# Patient Record
Sex: Female | Born: 1955
Health system: Southern US, Community
[De-identification: ages and names within clinical notes are randomized; demographics above are authoritative.]

## PROBLEM LIST (undated history)

## (undated) DIAGNOSIS — E785 Hyperlipidemia, unspecified: Secondary | ICD-10-CM

## (undated) DIAGNOSIS — G4733 Obstructive sleep apnea (adult) (pediatric): Secondary | ICD-10-CM

## (undated) DIAGNOSIS — K5792 Diverticulitis of intestine, part unspecified, without perforation or abscess without bleeding: Secondary | ICD-10-CM

## (undated) DIAGNOSIS — K219 Gastro-esophageal reflux disease without esophagitis: Secondary | ICD-10-CM

## (undated) DIAGNOSIS — R7303 Prediabetes: Secondary | ICD-10-CM

## (undated) DIAGNOSIS — F419 Anxiety disorder, unspecified: Secondary | ICD-10-CM

## (undated) DIAGNOSIS — J449 Chronic obstructive pulmonary disease, unspecified: Secondary | ICD-10-CM

## (undated) DIAGNOSIS — F32A Depression, unspecified: Secondary | ICD-10-CM

## (undated) DIAGNOSIS — J45909 Unspecified asthma, uncomplicated: Secondary | ICD-10-CM

## (undated) DIAGNOSIS — J310 Chronic rhinitis: Secondary | ICD-10-CM

## (undated) DIAGNOSIS — T7840XA Allergy, unspecified, initial encounter: Secondary | ICD-10-CM

## (undated) DIAGNOSIS — Z85850 Personal history of malignant neoplasm of thyroid: Secondary | ICD-10-CM

## (undated) DIAGNOSIS — T8859XA Other complications of anesthesia, initial encounter: Secondary | ICD-10-CM

## (undated) DIAGNOSIS — R7302 Impaired glucose tolerance (oral): Principal | ICD-10-CM

## (undated) DIAGNOSIS — M797 Fibromyalgia: Secondary | ICD-10-CM

## (undated) DIAGNOSIS — R0600 Dyspnea, unspecified: Secondary | ICD-10-CM

## (undated) DIAGNOSIS — M1990 Unspecified osteoarthritis, unspecified site: Secondary | ICD-10-CM

## (undated) DIAGNOSIS — I10 Essential (primary) hypertension: Secondary | ICD-10-CM

## (undated) DIAGNOSIS — C801 Malignant (primary) neoplasm, unspecified: Secondary | ICD-10-CM

## (undated) DIAGNOSIS — L409 Psoriasis, unspecified: Secondary | ICD-10-CM

## (undated) DIAGNOSIS — F329 Major depressive disorder, single episode, unspecified: Secondary | ICD-10-CM

## (undated) DIAGNOSIS — C3490 Malignant neoplasm of unspecified part of unspecified bronchus or lung: Secondary | ICD-10-CM

## (undated) DIAGNOSIS — M353 Polymyalgia rheumatica: Secondary | ICD-10-CM

## (undated) DIAGNOSIS — K7460 Unspecified cirrhosis of liver: Secondary | ICD-10-CM

## (undated) DIAGNOSIS — Z720 Tobacco use: Secondary | ICD-10-CM

## (undated) HISTORY — DX: Obstructive sleep apnea (adult) (pediatric): G47.33

## (undated) HISTORY — PX: OTHER SURGICAL HISTORY: SHX169

## (undated) HISTORY — DX: Impaired glucose tolerance (oral): R73.02

## (undated) HISTORY — PX: CHOLECYSTECTOMY: SHX55

## (undated) HISTORY — DX: Chronic rhinitis: J31.0

## (undated) HISTORY — DX: Essential (primary) hypertension: I10

## (undated) HISTORY — DX: Major depressive disorder, single episode, unspecified: F32.9

## (undated) HISTORY — DX: Personal history of malignant neoplasm of thyroid: Z85.850

## (undated) HISTORY — DX: Chronic obstructive pulmonary disease, unspecified: J44.9

## (undated) HISTORY — DX: Diverticulitis of intestine, part unspecified, without perforation or abscess without bleeding: K57.92

## (undated) HISTORY — DX: Hyperlipidemia, unspecified: E78.5

## (undated) HISTORY — DX: Polymyalgia rheumatica: M35.3

## (undated) HISTORY — PX: TOTAL ABDOMINAL HYSTERECTOMY: SHX209

## (undated) HISTORY — DX: Depression, unspecified: F32.A

## (undated) HISTORY — DX: Anxiety disorder, unspecified: F41.9

## (undated) HISTORY — DX: Unspecified cirrhosis of liver: K74.60

## (undated) HISTORY — DX: Tobacco use: Z72.0

## (undated) HISTORY — DX: Unspecified asthma, uncomplicated: J45.909

## (undated) HISTORY — PX: CHOLESTEATOMA EXCISION: SHX1345

## (undated) HISTORY — DX: Malignant neoplasm of unspecified part of unspecified bronchus or lung: C34.90

## (undated) HISTORY — DX: Allergy, unspecified, initial encounter: T78.40XA

## (undated) HISTORY — DX: Unspecified osteoarthritis, unspecified site: M19.90

## (undated) HISTORY — PX: APPENDECTOMY: SHX54

---

## 1999-04-11 ENCOUNTER — Encounter: Admission: RE | Admit: 1999-04-11 | Discharge: 1999-05-01 | Payer: Self-pay | Admitting: Unknown Physician Specialty

## 1999-05-02 ENCOUNTER — Encounter: Admission: RE | Admit: 1999-05-02 | Discharge: 1999-07-31 | Payer: Self-pay | Admitting: Unknown Physician Specialty

## 2000-02-29 ENCOUNTER — Other Ambulatory Visit: Admission: RE | Admit: 2000-02-29 | Discharge: 2000-02-29 | Payer: Self-pay | Admitting: Obstetrics and Gynecology

## 2000-07-31 ENCOUNTER — Encounter: Payer: Self-pay | Admitting: Pulmonary Disease

## 2000-07-31 ENCOUNTER — Ambulatory Visit: Admission: RE | Admit: 2000-07-31 | Discharge: 2000-07-31 | Payer: Self-pay | Admitting: Pulmonary Disease

## 2001-05-25 ENCOUNTER — Encounter: Payer: Self-pay | Admitting: Internal Medicine

## 2001-05-25 ENCOUNTER — Ambulatory Visit (HOSPITAL_BASED_OUTPATIENT_CLINIC_OR_DEPARTMENT_OTHER): Admission: RE | Admit: 2001-05-25 | Discharge: 2001-05-25 | Payer: Self-pay | Admitting: Pulmonary Disease

## 2001-07-21 ENCOUNTER — Other Ambulatory Visit: Admission: RE | Admit: 2001-07-21 | Discharge: 2001-07-21 | Payer: Self-pay | Admitting: *Deleted

## 2002-11-05 ENCOUNTER — Encounter: Payer: Self-pay | Admitting: Orthopedic Surgery

## 2002-11-05 ENCOUNTER — Encounter: Admission: RE | Admit: 2002-11-05 | Discharge: 2002-11-05 | Payer: Self-pay | Admitting: Orthopedic Surgery

## 2004-12-20 ENCOUNTER — Other Ambulatory Visit: Admission: RE | Admit: 2004-12-20 | Discharge: 2004-12-20 | Payer: Self-pay | Admitting: *Deleted

## 2005-06-06 ENCOUNTER — Encounter (HOSPITAL_COMMUNITY): Admission: RE | Admit: 2005-06-06 | Discharge: 2005-09-04 | Payer: Self-pay | Admitting: Dermatology

## 2005-09-11 ENCOUNTER — Encounter (HOSPITAL_COMMUNITY): Admission: RE | Admit: 2005-09-11 | Discharge: 2005-12-10 | Payer: Self-pay | Admitting: Dermatology

## 2005-12-31 ENCOUNTER — Encounter (HOSPITAL_COMMUNITY): Admission: RE | Admit: 2005-12-31 | Discharge: 2006-03-31 | Payer: Self-pay | Admitting: Dermatology

## 2006-04-22 ENCOUNTER — Encounter (HOSPITAL_COMMUNITY): Admission: RE | Admit: 2006-04-22 | Discharge: 2006-07-21 | Payer: Self-pay | Admitting: Dermatology

## 2008-09-25 ENCOUNTER — Emergency Department (HOSPITAL_COMMUNITY): Admission: EM | Admit: 2008-09-25 | Discharge: 2008-09-25 | Payer: Self-pay | Admitting: Emergency Medicine

## 2008-09-25 ENCOUNTER — Encounter (INDEPENDENT_AMBULATORY_CARE_PROVIDER_SITE_OTHER): Payer: Self-pay | Admitting: Emergency Medicine

## 2008-09-25 ENCOUNTER — Ambulatory Visit: Payer: Self-pay | Admitting: Vascular Surgery

## 2009-12-30 ENCOUNTER — Ambulatory Visit: Payer: Self-pay | Admitting: Internal Medicine

## 2009-12-30 DIAGNOSIS — G4733 Obstructive sleep apnea (adult) (pediatric): Secondary | ICD-10-CM | POA: Insufficient documentation

## 2009-12-30 DIAGNOSIS — I10 Essential (primary) hypertension: Secondary | ICD-10-CM

## 2009-12-30 DIAGNOSIS — M810 Age-related osteoporosis without current pathological fracture: Secondary | ICD-10-CM | POA: Insufficient documentation

## 2009-12-30 DIAGNOSIS — N63 Unspecified lump in unspecified breast: Secondary | ICD-10-CM

## 2009-12-30 DIAGNOSIS — J42 Unspecified chronic bronchitis: Secondary | ICD-10-CM

## 2009-12-30 DIAGNOSIS — Z862 Personal history of diseases of the blood and blood-forming organs and certain disorders involving the immune mechanism: Secondary | ICD-10-CM

## 2009-12-30 DIAGNOSIS — Z8639 Personal history of other endocrine, nutritional and metabolic disease: Secondary | ICD-10-CM

## 2009-12-30 DIAGNOSIS — E662 Morbid (severe) obesity with alveolar hypoventilation: Secondary | ICD-10-CM | POA: Insufficient documentation

## 2009-12-30 DIAGNOSIS — G5600 Carpal tunnel syndrome, unspecified upper limb: Secondary | ICD-10-CM | POA: Insufficient documentation

## 2009-12-30 DIAGNOSIS — M797 Fibromyalgia: Secondary | ICD-10-CM | POA: Insufficient documentation

## 2009-12-30 DIAGNOSIS — F419 Anxiety disorder, unspecified: Secondary | ICD-10-CM

## 2010-01-09 LAB — CONVERTED CEMR LAB: A-1 Antitrypsin, Ser: 151 mg/dL (ref 83–200)

## 2010-01-12 ENCOUNTER — Encounter: Payer: Self-pay | Admitting: Internal Medicine

## 2010-01-16 ENCOUNTER — Encounter: Admission: RE | Admit: 2010-01-16 | Discharge: 2010-01-16 | Payer: Self-pay | Admitting: Surgery

## 2010-01-23 ENCOUNTER — Ambulatory Visit: Payer: Self-pay | Admitting: Internal Medicine

## 2010-01-23 DIAGNOSIS — R0609 Other forms of dyspnea: Secondary | ICD-10-CM

## 2010-01-23 DIAGNOSIS — R0989 Other specified symptoms and signs involving the circulatory and respiratory systems: Secondary | ICD-10-CM

## 2010-02-07 ENCOUNTER — Ambulatory Visit: Payer: Self-pay | Admitting: Internal Medicine

## 2010-02-09 DIAGNOSIS — Z720 Tobacco use: Secondary | ICD-10-CM

## 2010-02-13 ENCOUNTER — Encounter: Payer: Self-pay | Admitting: Internal Medicine

## 2010-02-13 ENCOUNTER — Telehealth (INDEPENDENT_AMBULATORY_CARE_PROVIDER_SITE_OTHER): Payer: Self-pay | Admitting: *Deleted

## 2010-03-05 ENCOUNTER — Encounter: Payer: Self-pay | Admitting: Internal Medicine

## 2010-03-07 ENCOUNTER — Encounter: Payer: Self-pay | Admitting: Internal Medicine

## 2010-03-10 ENCOUNTER — Ambulatory Visit: Payer: Self-pay | Admitting: Internal Medicine

## 2010-03-17 ENCOUNTER — Telehealth: Payer: Self-pay | Admitting: Internal Medicine

## 2010-03-27 ENCOUNTER — Encounter: Payer: Self-pay | Admitting: Internal Medicine

## 2010-04-14 ENCOUNTER — Telehealth (INDEPENDENT_AMBULATORY_CARE_PROVIDER_SITE_OTHER): Payer: Self-pay | Admitting: *Deleted

## 2010-04-14 ENCOUNTER — Encounter (INDEPENDENT_AMBULATORY_CARE_PROVIDER_SITE_OTHER): Payer: Self-pay | Admitting: *Deleted

## 2010-07-14 ENCOUNTER — Ambulatory Visit: Payer: Self-pay | Admitting: Internal Medicine

## 2010-07-14 DIAGNOSIS — G4700 Insomnia, unspecified: Secondary | ICD-10-CM

## 2010-07-16 DIAGNOSIS — L409 Psoriasis, unspecified: Secondary | ICD-10-CM | POA: Insufficient documentation

## 2010-08-09 ENCOUNTER — Ambulatory Visit: Payer: Self-pay | Admitting: Internal Medicine

## 2010-08-09 DIAGNOSIS — R1084 Generalized abdominal pain: Secondary | ICD-10-CM | POA: Insufficient documentation

## 2010-08-09 DIAGNOSIS — R1319 Other dysphagia: Secondary | ICD-10-CM

## 2010-08-09 DIAGNOSIS — K219 Gastro-esophageal reflux disease without esophagitis: Secondary | ICD-10-CM

## 2010-09-04 ENCOUNTER — Telehealth: Payer: Self-pay | Admitting: Internal Medicine

## 2010-09-04 ENCOUNTER — Encounter: Payer: Self-pay | Admitting: Internal Medicine

## 2010-10-18 ENCOUNTER — Telehealth: Payer: Self-pay | Admitting: Internal Medicine

## 2010-10-19 ENCOUNTER — Ambulatory Visit (HOSPITAL_COMMUNITY)
Admission: RE | Admit: 2010-10-19 | Discharge: 2010-10-19 | Payer: Self-pay | Source: Home / Self Care | Attending: Internal Medicine | Admitting: Internal Medicine

## 2010-10-19 ENCOUNTER — Encounter: Payer: Self-pay | Admitting: Internal Medicine

## 2010-10-20 ENCOUNTER — Encounter: Payer: Self-pay | Admitting: Internal Medicine

## 2010-10-31 NOTE — Assessment & Plan Note (Signed)
**Note De-Identified [place] Obfuscation** Summary: Dysphasia, abdominal pain, colonoscopy (new pt)   History of Present Illness Primary GI MD: Yancey Flemings MD Primary Provider: Joette Catching, MD Requesting Provider: Joette Catching M.D. Chief Complaint: Solid food dysphagia and acid reflux. Pt states she LLQ intermittant abd pain with constipation and rectal pain when straining. Pt states she has seen Dr. Randa Evens in the past for a colonoscopy 10-15 years ago. History of Present Illness:   55 year old female with multiple significant medical problems including hypertension, hyperlipidemia, morbid obesity with obstructive sleep apnea, chronic and ongoing tobacco abuse with advanced COPD requiring chronic oxygen therapy, and anxiety/depression. She presents today regarding intermittent solid food dysphagia, lower abdominal discomfort, and the need for colonoscopy. First, she reports a 1-2 month history of intermittent solid food dysphagia with transient food impaction on one occasion. No weight loss or odynophagia. She has been on omeprazole chronically for "acid reflux". Significant epigastric burning off medication. She tells me that she underwent an upper endoscopy 20 years ago and was found to have a hiatal hernia. Next, she has had chronic intermittent problems with lower abdominal discomfort for greater than one year. Generally, left lower quadrant discomfort every other day that lasts several hours and occasional right lower quadrant discomfort. Discomfort is not affected by meals or defecation. No bleeding. She does tend to be constipated but requires no laxatives or stool softeners or enemas. She does mention that lying down helps her discomfort. Finally, she mentions having had a colonoscopy approximately 10-15 years ago with Dr. Randa Evens. She was due for routine followup, and encouraged by her PCP to undergo such. She decided to establish with this office as she has relatives, including her sister, who have been seen here by me.   GI Review  of Systems    Reports abdominal pain, acid reflux, bloating, and  dysphagia with solids.     Location of  Abdominal pain: lower abdomen.    Denies belching, chest pain, dysphagia with liquids, heartburn, loss of appetite, nausea, vomiting, vomiting blood, weight loss, and  weight gain.      Reports constipation and  rectal pain.     Denies anal fissure, black tarry stools, change in bowel habit, diarrhea, diverticulosis, fecal incontinence, heme positive stool, hemorrhoids, irritable bowel syndrome, jaundice, light color stool, liver problems, and  rectal bleeding.    Current Medications (verified): 1)  Lisinopril 10 Mg Tabs (Lisinopril) .... One Tablet By Mouth Once Daily 2)  Omeprazole 20 Mg Cpdr (Omeprazole) .... Take 1 By Mouth Once Daily 3)  Ativan 0.5 Mg Tabs (Lorazepam) .... Take 1 By Mouth Once Daily As Needed 4)  Oxygen .... 2 Liters At Night 5)  Cpap 13 [facility] 6)  Methotrexate 2.5 Mg Tabs (Methotrexate Sodium) .... 7 Tablets By Mouth Once A Week 7)  Doxepin Hcl 10 Mg Caps (Doxepin Hcl) .... Take 1-2 By Mouth At Bedtime 8)  Colcrys 0.6 Mg Tabs (Colchicine) .... 2 By Mouth Two Times A Day X 2 Days Then Once Daily X 2 Days.  Allergies (verified): No Known Drug Allergies  Past History:  Past Medical History: Tachyarrythmia Tobacco abuse Chronic bronchitis Hx thyroic cancer- left partial thyroidectomy rhinitis Obstructive sleep apnea polymyalgia diverticulits DGD spine psoriasis- monthly injection- Dr Terri Piedra Anxiety Disorder Arthritis COPD Hyperlipidemia Hypertension  Past Surgical History: Reviewed history from 12/30/2009 and no changes required. total hysterectomy cholecystectomy thyroidectomy for thyroid cancer Appendectomy carpal tunnel left shoulder spurs right lower leg- ORIF cholesteatoma right ear  Family History: Reviewed history from 12/30/2009 and no **Note De-Identified [place] Obfuscation** changes required. Emphysema-sisters- 2 with a1AT deficiency on  replacement. allergies-sister asthma-sister heart disease-mother,father,sister's brother Associate Professor, brother GM died TB  Social History: Married with children smoker-1.5ppd disabled Alcohol Use - no Daily Caffeine Use  Review of Systems       The patient complains of allergy/sinus, anxiety-new, arthritis/joint pain, back pain, confusion, depression-new, fatigue, hearing problems, muscle pains/cramps, shortness of breath, skin rash, sleeping problems, swelling of feet/legs, thirst - excessive, and urine leakage.  The patient denies anemia, blood in urine, breast changes/lumps, change in vision, cough, coughing up blood, fainting, fever, headaches-new, heart murmur, heart rhythm changes, itching, menstrual pain, night sweats, nosebleeds, pregnancy symptoms, sore throat, swollen lymph glands, thirst - excessive , urination - excessive , urination changes/pain, vision changes, and voice change.    Vital Signs:  Patient profile:   55 year old female Height:      65 inches Weight:      255.38 pounds BMI:     42.65 Pulse rate:   100 / minute Pulse rhythm:   regular BP sitting:   110 / 70  (left arm) Cuff size:   large  Physical Exam  General:  chronically ill-appearing, morbidly obese female with nasal cannula oxygen in place. No acute distress Head:  Normocephalic and atraumatic. Eyes:  PERRLA, no icterus. Nose:  No deformity, discharge,  or lesions. Mouth:  No deformity or lesions. Dentures Neck:  neck is thick Lungs:  Clear throughout to auscultation. Heart:  Regular rate and rhythm; no murmurs, rubs,  or bruits. Abdomen:  Soft, markedly obese,nontender and nondistended. No masses, hepatosplenomegaly or hernias noted. Normal bowel sounds. Rectal:  deferred until colonoscopy Msk:  Symmetrical with no gross deformities.  Pulses:  Normal pulses noted. Extremities:  no edema Neurologic:  Alert and  oriented x4 Skin:  Intact without significant lesions or rashes. Psych:   Alert and cooperative. Normal mood and affect.   Impression & Recommendations:  Problem # 1:  OTHER DYSPHAGIA (ICD-787.29) several month history of intermittent solid food dysphagia. This on a background of GERD. Rule out peptic stricture. Rule out neoplasm.  Plan: #1. Upper endoscopy with possible esophageal dilation. The nature of the procedure as well as the risks, benefits, and alternatives have been reviewed. She understood and agreed to proceed #2. The patient is at very HIGH RISK given her morbid obesity and chronic lung disease. As such, we will perform the examination at the hospital with the assistance of MAC.  Problem # 2:  GERD (ICD-530.81) GERD. On chronic PPI. No active reflux symptoms, though new onset dysphagia as noted above. Recommend strict adherence to reflux precautions and continuing PPI  Problem # 3:  ABDOMINAL PAIN, GENERALIZED (ICD-789.07) vague intermittent lower abdominal pain without weight loss or bleeding. Problem has not accelerated over time. The etiology is unclear, though she does have a tendency toward constipation.  Plan: #1. Plans for colonoscopy. See below #2. If colonoscopy negative, then regulate bowels with MiraLax  #3. If colonoscopy negative, then CT scan of the abdomen and pelvis could be considered  Problem # 4:  SPECIAL SCREENING FOR MALIGNANT NEOPLASMS COLON (ICD-V76.51) last colonoscopy greater than 10 years ago. Due for followup screening. Despite her young age, high risk given her comorbidities. The nature of colonoscopy as well as the risks, benefits, and alternatives were reviewed. She understood and agreed to proceed. Movi prep prescribed. Patient instructed on its use. Again, given her comorbidities, the exam will be performed at the hospital with MAC. We will do this **Note De-Identified [place] Obfuscation** concur with her upper endoscopy to limit the case as for this monitored anesthesia. Patient for me that during her previous colonoscopy, she was uncomfortable and awake.  Hopefully, this will be a better experience for her this time  Other Orders: [facility] (Col/End [place])  Patient Instructions: 1)  Copy sent to : Joette Catching, MD 2)  Colonoscopy/endoscopy with Propoful at  [facility] 09/07/10 12:45 arrive at 10:45 am 3)  Movi prep instructions given 4)  Movi prep printed and given to patient. 5)  Colonoscopy and Flexible Sigmoidoscopy brochure given.  6)  Upper Endoscopy brochure given.  Prescriptions: MOVIPREP 100 GM  SOLR (PEG-KCL-NACL-NASULF-NA ASC-C) As per prep instructions.  #1 x 0   Entered by:   Milford Cage NCMA   Authorized by:   Hilarie Fredrickson MD   Signed by:   Milford Cage NCMA on 08/09/2010   Method used:   Print then Give to Patient   RxID:   [id number]

## 2010-10-31 NOTE — Letter (Signed)
**Note De-Identified [place] Obfuscation** Summary: [facility] Instructions  [facility]  [address] [place], [place] [address]   Phone: [phone]  Fax: [phone]       Hailey Hernandez    1956/01/30    MRN: [id number]        Procedure Day /Date:THURSDAY, 10/19/10     Arrival Time:10:00 AM     Procedure Time:12 NOON     Location of Procedure:                     [facility] ( Outpatient Registration)   PREPARATION FOR COLONOSCOPY WITH MOVIPREP/ENDO WITH PROPOFUL   Starting 5 days prior to your procedure 10/14/10 do not eat nuts, seeds, popcorn, corn, beans, peas,  salads, or any raw vegetables.  Do not take any fiber supplements (e.g. Metamucil, Citrucel, and Benefiber).  THE DAY BEFORE YOUR PROCEDURE         DATE: 10/18/10  DAY: WEDNESDAY  1.  Drink clear liquids the entire day-NO SOLID FOOD  2.  Do not drink anything colored red or purple.  Avoid juices with pulp.  No orange juice.  3.  Drink at least 64 oz. (8 glasses) of fluid/clear liquids during the day to prevent dehydration and help the prep work efficiently.  CLEAR LIQUIDS INCLUDE: Water Jello Ice Popsicles Tea (sugar ok, no milk/cream) Powdered fruit flavored drinks Coffee (sugar ok, no milk/cream) Gatorade Juice: apple, white grape, white cranberry  Lemonade Clear bullion, consomm, broth Carbonated beverages (any kind) Strained chicken noodle soup Hard Candy                             4.  In the morning, mix first dose of MoviPrep solution:    Empty 1 Pouch A and 1 Pouch B into the disposable container    Add lukewarm drinking water to the top line of the container. Mix to dissolve    Refrigerate (mixed solution should be used within 24 hrs)  5.  Begin drinking the prep at 5:00 p.m. The MoviPrep container is divided by 4 marks.   Every 15 minutes drink the solution down to the next mark (approximately 8 oz) until the full liter is complete.     6.  Follow completed prep with 16 oz of clear liquid of your  choice (Nothing red or purple).  Continue to drink clear liquids until bedtime.  7.  Before going to bed, mix second dose of MoviPrep solution:    Empty 1 Pouch A and 1 Pouch B into the disposable container    Add lukewarm drinking water to the top line of the container. Mix to dissolve    Refrigerate  NOTHING TO EAT OR DRINK AFTER MIDNIGHT EXCEPT FOR YOUR PREP ON THURSDAY  THE DAY OF YOUR PROCEDURE      DATE: 10/19/10 DAY: THURSDAY  Beginning at 7:00 a.m. (5 hours before procedure):         1. Every 15 minutes, drink the solution down to the next mark (approx 8 oz) until the full liter is complete.     MEDICATION INSTRUCTIONS  Unless otherwise instructed, you should take regular prescription medications with a small sip of water   as early as possible the morning of your procedure.         OTHER INSTRUCTIONS  You will need a responsible adult at least 57 years of age to accompany you and drive you home. **Note De-Identified [place] Obfuscation** This person must remain in the waiting room during your procedure.  Wear loose fitting clothing that is easily removed.  Leave jewelry and other valuables at home.  However, you may wish to bring a book to read or  an iPod/MP3 player to listen to music as you wait for your procedure to start.  Remove all body piercing jewelry and leave at home.  Total time from sign-in until discharge is approximately 2-3 hours.  You should go home directly after your procedure and rest.  You can resume normal activities the  day after your procedure.  The day of your procedure you should not:   Drive   Make legal decisions   Operate machinery   Drink alcohol   Return to work  You will receive specific instructions about eating, activities and medications before you leave.    The above instructions have been reviewed and explained to me by   _______________________    I fully understand and can verbalize these instructions _____________________________ Date  _________

## 2010-10-31 NOTE — Assessment & Plan Note (Signed)
**Note De-Identified [place] Obfuscation** Summary: rov 1 month///kp   Primary Provider/Referring Provider:  Nyland  CC:  1 month follow up visit-doing better but allergies are bothering her this week.Marland Kitchen  History of Present Illness: Feb 07, 2010- Bronchitis, breast mass, ? a1ATdef, Allergic rhinitis Breast lump-She had work up by Dr Daphine Deutscher including MM and ultrasound - she says it turned out this was just a bruise from fall out of bed.. I explained inheritance of a1AD. Her level is normal at 151. We won't spend the money to get phenotype, but with this score she is probably not a heterozygote. She had had phlebotomy for polycythemia- now normalized. Dr Ubaldo Glassing- heme onc. She continues injections for for psoriasis. She uses oxygen at night. Not using CPAP- "uncomfortable". She uses oxygen at 2. She wakes unable to breathe well, sits on side of bed and falls over when she falls asleep again. Continues to smoke. We discussed smoking cessation and support.Marland Kitchen PFT-mod restriction TLC 70%, likely obesity/hypoventilation.. No response to dilator. Probable obstruction in small airways. DLCO 43%. FEV1 has declined from 1.24 L 07/31/00 to 1.12 L as of 01/23/10.  March 10, 2010- Bronchitis/ COPD, tobacco, allergic rhinitis, Hx OSA/ noncompliant.......sister here She has continuous oxygen and wears it unless she is sitting quietly at computer etc. She uses it through CPAP at night. She is comfortable with her current autotitration cpap, but [employer] hasn't picked up download. This is much more comfortable than the BiPAP she had before. The mask she is using seems already to be coming apart. She describes 2 episodes when she burned lungs by mixing household cleansers in past, leaving prolonged bronchits. Smoking has cut down from 2.5 to 1.5 packs/ day.  Preventive Screening-Counseling & Management  Alcohol-Tobacco     Smoking Status: current     Smoke Cessation Stage: ready     Packs/Day: 1.5     Tobacco Counseling: to quit use of tobacco  products  Current Medications (verified): 1)  Symbicort 160-4.5 Mcg/act Aero (Budesonide-Formoterol Fumarate) .... 2 Puffs Two Times A Day 2)  Crestor 10 Mg Tabs (Rosuvastatin Calcium) .... Take 1 By Mouth Once Daily 3)  Lisinopril-Hydrochlorothiazide 10-12.5 Mg Tabs (Lisinopril-Hydrochlorothiazide) .... Take 1 By Mouth Once Daily 4)  Omeprazole 20 Mg Cpdr (Omeprazole) .... Take 1 By Mouth Once Daily 5)  Flagyl 500 Mg Tabs (Metronidazole) .... Three Times A Day 6)  Ativan 0.5 Mg Tabs (Lorazepam) .... Take 1 By Mouth Once Daily As Needed 7)  Cipro 500 Mg Tabs (Ciprofloxacin Hcl) .... Two Times A Day Til Gone 8)  Aspirin 81 Mg Tbec (Aspirin) .... Take 1 By Mouth Once Daily 9)  Oxygen .... 2 Liters At Night 10)  Fluticasone Propionate 50 Mcg/act Susp (Fluticasone Propionate) .... 2 Sprays in Each Nostril Daily  Allergies (verified): No Known Drug Allergies  Past History:  Past Medical History: Last updated: 02/07/2010 Tachyarrythmia Tobacco abuse Chronic bronchitis Hx thyroic cancer- left partial thyroidectomy rhinitis Obstrucive sleep apnea polymyalgia diverticulits DGD spine psoriasis- monthly injection- Dr Terri Piedra  Past Surgical History: Last updated: 12/30/2009 total hysterectomy cholecystectomy thyroidectomy for thyroid cancer Appendectomy carpal tunnel left shoulder spurs right lower leg- ORIF cholesteatoma right ear  Family History: Last updated: 12/30/2009 Emphysema-sisters- 2 with a1AT deficiency on replacement. allergies-sister asthma-sister heart disease-mother,father,sister's brother Associate Professor, brother GM died TB  Social History: Last updated: 12/30/2009 Married with children smoker-1.5ppd disabled  Risk Factors: Smoking Status: current (03/10/2010) Packs/Day: 1.5 (03/10/2010)  Social History: Smoking Status:  current Packs/Day:  1.5  Review of Systems **Note De-Identified [place] Obfuscation** See HPI       The patient complains of shortness of breath with activity,  shortness of breath at rest, and non-productive cough.  The patient denies coughing up blood, chest pain, irregular heartbeats, acid heartburn, indigestion, loss of appetite, weight change, abdominal pain, difficulty swallowing, sore throat, tooth/dental problems, headaches, nasal congestion/difficulty breathing through nose, and sneezing.    Vital Signs:  Patient profile:   55 year old female Height:      65 inches Weight:      254 pounds BMI:     42.42 O2 Sat:      95 % on 2 L/min Pulse rate:   109 / minute BP sitting:   120 / 78  (left arm) Cuff size:   large  Vitals Entered By: Reynaldo Minium CMA (March 10, 2010 10:02 AM)  O2 Flow:  2 L/min CC: 1 month follow up visit-doing better but allergies are bothering her this week.   Physical Exam  Additional Exam:  General: A/Ox3; pleasant and cooperative, NAD, obese   95% 02 2L    SKIN: psoriatic crusting, SEVERE NODES: no lymphadenopathy HEENT: Crook/AT, EOM- WNL, Conjuctivae- clear, PERRLA, TM-WNL, Nose- clear, Throat- clear and wnl, dentures, Mallampati  II NECK: Supple w/ fair ROM, JVD- none, normal carotid impulses w/o bruits Thyroid- normal to palpation CHEST: diminished with wheeze/ COUGH, UNLABORED,     . HEART: RRR- rapid, no m/g/r heard ABDOMEN: Tender to pressure under left lower anterior rib line---no mass or spleen tip felt. ZOX:WRUE, nl pulses,stasis changes pretibial , NO EDEMA NEURO: Grossly intact to observation     Impression & Recommendations:  Problem # 1:  TOBACCO USER (ICD-305.1)  i again prompt her to get off the cigarettes. This is a life-long habit.  Problem # 2:  OBSTRUCTIVE SLEEP APNEA (ICD-327.23)  We discussed transition from autotitration to fixed CPAP. She may need permanent autotitration  Problem # 3:  BRONCHITIS, CHRONIC (ICD-491.9)  Severe COPD. Meds are appropriate. She has oxygen dependent chronic respiratory failure.  Orders: Est. Patient Level IV (45409)  Other Orders: Tobacco use  cessation intermediate 3-10 minutes (81191)  Patient Instructions: 1)  Please schedule a follow-up appointment in 4 months. 2)  Continue the oxygen 3)  If [facility] doesn't pick up the machine and card soon, call them to arrange it. When I get the download information, I will change to a fixed pressure CPAP setting and we will see how that feels for you.  4)  Please keep trying very hard to stop smoking before it stops you !

## 2010-10-31 NOTE — Letter (Signed)
**Note De-Identified [place] Obfuscation** Summary: CMN/[facility]  CMN/[facility]   Imported By: Lester Indios 03/10/2010 09:34:41  _____________________________________________________________________  External Attachment:    Type:   Image     Comment:   External Document

## 2010-10-31 NOTE — Progress Notes (Signed)
**Note De-Identified [place] Obfuscation** Summary: Hospital Procedure  Phone Note Call from Patient Call back at Home Phone [phone]   Caller: Patient Call For: Dr. Marina Goodell Reason for Call: Talk to Nurse Summary of Call: Pt needs to r/s appt at hospital on 09/07/10 because she has gout in feet and can hardly walk Initial call taken by: [place] Johnson,  September 04, 2010 8:35 AM  Follow-up for Phone Call        called [facility] rescheduled Endo/Colon for 10/19/09 12 noon. Mailed patient new instructions.  Milford Cage [facility]  September 04, 2010 8:59 AM      Appended Document: Hospital Procedure THIS NEEDS TO BE WITH ANESTHESIA / PROPOFOL. THANKS  Appended Document: Hospital Procedure It is scheduled for 10/19/10 with propoful.  12 noon.

## 2010-10-31 NOTE — Assessment & Plan Note (Signed)
**Note De-Identified [place] Obfuscation** Summary: cough, ? allergies/former pt/apc   Primary Provider/Referring Provider:  Nyland  CC:  New pt-allergy trouble and breathing; former pt of [employer].Marland Kitchen  History of Present Illness: January 05, 2010- 53 yoF former patient last seen in 2003 by Dr Shelle Iron for OSA. Comes now with sister, patient of mine, asking evaluation of breathing. Smokes 1.5 ppd. Hx OSA with obesity/ hypoventilation. Never got used to BiPAP and quit. Has continued home O2 for sleep. Daily cough and clear to yellow sputum. Occasional streak of blood "from sinuses" has been noted unchanged over several years. Easy DOE with ADLs, housework. Now on Cipro and Flagyl. Eyes and nose water- worst in Spring pollen. Hx hosp x several for pneumonias. Has not had pneumovax. Using Symbicort. 2 sisters with alpha-1-antitrypsin deficiency on replacement. She has no hx of liver disease.  Current Medications (verified): 1)  Symbicort 160-4.5 Mcg/act Aero (Budesonide-Formoterol Fumarate) .... 2 Puffs Two Times A Day 2)  Crestor 10 Mg Tabs (Rosuvastatin Calcium) .... Take 1 By Mouth Once Daily 3)  Lisinopril-Hydrochlorothiazide 10-12.5 Mg Tabs (Lisinopril-Hydrochlorothiazide) .... Take 1 By Mouth Once Daily 4)  Omeprazole 20 Mg Cpdr (Omeprazole) .... Take 1 By Mouth Once Daily 5)  Flagyl 500 Mg Tabs (Metronidazole) .... Three Times A Day 6)  Ativan 0.5 Mg Tabs (Lorazepam) .... Take 1 By Mouth Once Daily As Needed 7)  Cipro 500 Mg Tabs (Ciprofloxacin Hcl) .... Two Times A Day Til Gone 8)  Aspirin 81 Mg Tbec (Aspirin) .... Take 1 By Mouth Once Daily 9)  Oxygen .... 2 Liters At Night  Allergies (verified): No Known Drug Allergies  Past History:  Past Medical History: Tachyarrythmia Tobacco abuse Chronic bronchitis Hx thyroic cancer- left partial thyroidectomy rhinitis left breast nodule Obstrucive sleep apnea polymyalgia diverticulits DGD spine psoriasis- monthly injection- Dr Terri Piedra  Past Surgical History: total  hysterectomy cholecystectomy thyroidectomy for thyroid cancer Appendectomy carpal tunnel left shoulder spurs right lower leg- ORIF cholesteatoma right ear  Family History: Emphysema-sisters- 2 with a1AT deficiency on replacement. allergies-sister asthma-sister heart disease-mother,father,sister's brother Associate Professor, brother GM died TB  Social History: Married with children smoker-1.5ppd disabled  Review of Systems      See HPI       The patient complains of shortness of breath with activity, productive cough, irregular heartbeats, acid heartburn, indigestion, abdominal pain, difficulty swallowing, headaches, nasal congestion/difficulty breathing through nose, sneezing, anxiety, depression, hand/feet swelling, joint stiffness or pain, and change in color of mucus.  The patient denies shortness of breath at rest, non-productive cough, coughing up blood, chest pain, loss of appetite, weight change, sore throat, tooth/dental problems, itching, ear ache, rash, and fever.    Vital Signs:  Patient profile:   55 year old female Height:      65 inches Weight:      264.25 pounds BMI:     44.13 O2 Sat:      89 % on Room air Pulse rate:   114 / minute BP sitting:   132 / 76  (left arm) Cuff size:   large  Vitals Entered By: Reynaldo Minium CMA (December 30, 2009 11:35 AM)  O2 Flow:  Room air  Physical Exam  Additional Exam:  General: A/Ox3; pleasant and cooperative, NAD, obese SKIN: no rash, lesions NODES: no lymphadenopathy HEENT: [place]/AT, EOM- WNL, Conjuctivae- clear, PERRLA, TM-WNL, Nose- clear, Throat- clear and wnl, dentures, Mallampati  II NECK: Supple w/ fair ROM, JVD- none, normal carotid impulses w/o bruits Thyroid- normal to palpation CHEST: diminished with **Note De-Identified [place] Obfuscation** wheeze,     2 cm red superficial indurated lesion left breast upper inner quad. HEART: RRR- rapid, no m/g/r heard ABDOMEN: Soft and nl; nml bowel sounds; no organomegaly or masses noted ZOX:WRUE, nl  pulses,stasis changes pretibial  NEURO: Grossly intact to observation      Impression & Recommendations:  Problem # 1:  BRONCHITIS, CHRONIC (ICD-491.9)  Key will be to stop smoking and initial discussion begun with family suppoirt. We will check for a1AT which runs in the family.  Problem # 2:  BREAST MASS, LEFT (ICD-611.72)  New left breast mass with no routine mamogram. She agrees to surgical referral for possible breast cancer.  Problem # 3:  OBESITY HYPOVENTILATION SYNDROME (ICD-278.03)  Prior PFT showed restriction which may be the basis for previous nocturnal hypoxia. Now resting sat is only 89% and we may need to broaden this oxygen order after PFT and CXR reviewed.  Medications Added to Medication List This Visit: 1)  Symbicort 160-4.5 Mcg/act Aero (Budesonide-formoterol fumarate) .... 2 puffs two times a day 2)  Crestor 10 Mg Tabs (Rosuvastatin calcium) .... Take 1 by mouth once daily 3)  Lisinopril-hydrochlorothiazide 10-12.5 Mg Tabs (Lisinopril-hydrochlorothiazide) .... Take 1 by mouth once daily 4)  Omeprazole 20 Mg Cpdr (Omeprazole) .... Take 1 by mouth once daily 5)  Flagyl 500 Mg Tabs (Metronidazole) .... Three times a day 6)  Ativan 0.5 Mg Tabs (Lorazepam) .... Take 1 by mouth once daily as needed 7)  Cipro 500 Mg Tabs (Ciprofloxacin hcl) .... Two times a day til gone 8)  Aspirin 81 Mg Tbec (Aspirin) .... Take 1 by mouth once daily 9)  Oxygen  .... 2 liters at night  Other Orders: New Patient Level III (45409) T-Alpha-1-Antitrypsin Tot [phone]) Nebulizer Tx (56213) Surgical Referral (Surgery) Admin of Therapeutic Inj  intramuscular or subcutaneous (08657) Depo- Medrol 80mg  (J1040)  Patient Instructions: 1)  Please schedule a follow-up appointment in 1 month. 2)  See [facility] for referral to surgeon 3)  Schedule PFT and 6 MWT 4)  Neb xop 1.25 5)  depo 80 6)  will get CXR and lab results from [facility] in [place].     Medication  Administration  Injection # 1:    Medication: Depo- Medrol 80mg     Diagnosis: BRONCHITIS, CHRONIC (ICD-491.9)    Route: IM    Site: RUOQ gluteus    Exp Date: 08-2012    Lot #: [id number]    Mfr: Pharmacia    Patient tolerated injection without complications    Given by: Boone Master CNA (December 30, 2009 12:41 PM)  Orders Added: 1)  New Patient Level III [99203] 2)  T-Alpha-1-Antitrypsin Tot [82103-23820] 3)  Nebulizer Tx [84696] 4)  Surgical Referral [Surgery] 5)  Admin of Therapeutic Inj  intramuscular or subcutaneous [96372] 6)  Depo- Medrol 80mg  [J1040]

## 2010-10-31 NOTE — Letter (Signed)
**Note De-Identified [place] Obfuscation** Summary: CMN for CPAP Supplies/[facility]  CMN for CPAP Supplies/[facility]   Imported By: Sherian Rein 03/09/2010 10:56:37  _____________________________________________________________________  External Attachment:    Type:   Image     Comment:   External Document

## 2010-10-31 NOTE — Letter (Signed)
**Note De-Identified [place] Obfuscation** Summary: New Patient letter  [facility]  [address] [place], [place] [address]   Phone: [phone]  Fax: [phone]       04/14/2010 MRN: [id number]  Hailey Hernandez [address] [place], [place]  [address]  Dear Hailey Hernandez,  Welcome to the Gastroenterology Division at [facility].    You are scheduled to see Dr.  Marina Goodell on 05-31-10 at 10:00a.m. on the 3rd floor at [facility], [address]  We ask that you try to arrive at our office 15 minutes prior to your appointment time to allow for check-in.  We would like you to complete the enclosed self-administered evaluation form prior to your visit and bring it with you on the day of your appointment.  We will review it with you.  Also, please bring a complete list of all your medications or, if you prefer, bring the medication bottles and we will list them.  Please bring your insurance card so that we may make a copy of it.  If your insurance requires a referral to see a specialist, please bring your referral form from your primary care physician.  Co-payments are due at the time of your visit and may be paid by cash, check or credit card.     Your office visit will consist of a consult with your physician (includes a physical exam), any laboratory testing he/she may order, scheduling of any necessary diagnostic testing (e.g. x-ray, ultrasound, CT-scan), and scheduling of a procedure (e.g. Endoscopy, Colonoscopy) if required.  Please allow enough time on your schedule to allow for any/all of these possibilities.    If you cannot keep your appointment, please call [phone] to cancel or reschedule prior to your appointment date.  This allows [place] the opportunity to schedule an appointment for another patient in need of care.  If you do not cancel or reschedule by 5 p.m. the business day prior to your appointment date, you will be charged a $50.00 late cancellation/no-show fee.    Thank you for choosing [facility] for your medical needs.  We appreciate the opportunity to care for you.  Please visit [place] at our website  to learn more about our practice.                     Sincerely,                                                             The Gastroenterology Division

## 2010-10-31 NOTE — Miscellaneous (Signed)
**Note De-identified [place] Obfuscation** Summary: Orders Update pft charges  Clinical Lists Changes  Orders: Added new Service order of Carbon Monoxide diffusing w/capacity (94720) - Signed Added new Service order of Lung Volumes (94240) - Signed Added new Service order of Spirometry (Pre & Post) (94060) - Signed 

## 2010-10-31 NOTE — Letter (Signed)
**Note De-Identified [place] Obfuscation** Summary: [facility]   Imported By: Sherian Rein 02/07/2010 08:47:04  _____________________________________________________________________  External Attachment:    Type:   Image     Comment:   External Document

## 2010-10-31 NOTE — Assessment & Plan Note (Signed)
**Note De-Identified [place]is Obfuscation** Summary: 4 months/apc   Primary Provider/Referring Provider:  Nyland  CC:  4 month follow up visit-OSA and chronic bronchitis.  History of Present Illness: March 10, 2010- Bronchitis/ COPD, tobacco, allergic rhinitis, Hx OSA/ noncompliant.......sister here She has continuous oxygen and wears it unless she is sitting quietly at computer etc. She uses it through CPAP at night. She is comfortable with her current autotitration cpap, but [facility] hasn't picked up download. This is much more comfortable than the BiPAP she had before. The mask she is using seems already to be coming apart. She describes 2 episodes when she burned lungs by mixing household cleansers in past, leaving prolonged bronchits. Smoking has cut down from 2.5 to 1.5 packs/ day.  July 14, 2010  Bronchitis/ COPD, tobacco, allergic rhinitis, Hx OSA/ noncompliant.......sister here Nurse cc: 4 month follow up visit-OSA and chronic bronchitis Now on methotrexate for psoriasis. Family has A1AT def but she doesn't. Noted that O2 sat at her primary office was 92 after walking, but drifted down into upper 80's while sitting very quietly.  Uses CPAP now regularly, worn with oxygen. Stil wakes during night and estimates she only sleeps soundly 3 hrs/ night. Blames joint pains for waking her, including gout. Off lisinopril. Coughing very little now and less wheeze and rattle. Phlegm clears from yellow as day goes on.  Still working on the smoking. Variable use of CPAP with compliance again discussed.    Preventive Screening-Counseling & Management  Alcohol-Tobacco     Smoking Status: current     Smoke Cessation Stage: ready     Packs/Day: 1.5     Tobacco Counseling: to quit use of tobacco products  Current Medications (verified): 1)  Lisinopril-Hydrochlorothiazide 10-12.5 Mg Tabs (Lisinopril-Hydrochlorothiazide) .... Take 1 By Mouth Once Daily 2)  Omeprazole 20 Mg Cpdr (Omeprazole) .... Take 1 By Mouth Once Daily 3)  Ativan 0.5 Mg  Tabs (Lorazepam) .... Take 1 By Mouth Once Daily As Needed 4)  Oxygen .... 2 Liters At Night 5)  Cpap 13 [facility] 6)  Methotrexate 2.5 Mg Tabs (Methotrexate Sodium) .... Take 6 By Mouth Once Daily X 1 Week and Then Take 7 By Mouth Once Daily X 1 Week 7)  Uloric 40 Mg Tabs (Febuxostat) .... Take 1 By Mouth Once Daily 8)  Doxepin Hcl 10 Mg Caps (Doxepin Hcl) .... Take 1-2 By Mouth At Bedtime 9)  Colcrys 0.6 Mg Tabs (Colchicine) .... 2 By Mouth Two Times A Day X 2 Days Then Once Daily X 2 Days.  Allergies (verified): No Known Drug Allergies  Past History:  Past Surgical History: Last updated: 12/30/2009 total hysterectomy cholecystectomy thyroidectomy for thyroid cancer Appendectomy carpal tunnel left shoulder spurs right lower leg- ORIF cholesteatoma right ear  Family History: Last updated: 12/30/2009 Emphysema-sisters- 2 with a1AT deficiency on replacement. allergies-sister asthma-sister heart disease-mother,father,sister's brother Associate Professor, brother GM died TB  Social History: Last updated: 12/30/2009 Married with children smoker-1.5ppd disabled  Risk Factors: Smoking Status: current (07/14/2010) Packs/Day: 1.5 (07/14/2010)  Past Medical History: Tachyarrythmia Tobacco abuse Chronic bronchitis Hx thyroic cancer- left partial thyroidectomy rhinitis Obstructive sleep apnea polymyalgia diverticulits DGD spine psoriasis- monthly injection- Dr Terri Piedra  Review of Systems      See HPI       The patient complains of shortness of breath with activity, non-productive cough, and rash.  The patient denies shortness of breath at rest, productive cough, coughing up blood, chest pain, irregular heartbeats, acid heartburn, indigestion, loss of appetite, weight change, abdominal pain, difficulty swallowing, sore **Note De-Identified [place] Obfuscation** throat, tooth/dental problems, headaches, nasal congestion/difficulty breathing through nose, sneezing, change in color of mucus, and fever.    Vital  Signs:  Patient profile:   55 year old female Height:      65 inches Weight:      256.50 pounds BMI:     42.84 O2 Sat:      90 % on Room air Pulse rate:   108 / minute BP sitting:   146 / 88  (left arm) Cuff size:   large  Vitals Entered By: Reynaldo Minium CMA (July 14, 2010 9:36 AM)  O2 Flow:  Room air CC: 4 month follow up visit-OSA and chronic bronchitis   Physical Exam  Additional Exam:  General: A/Ox3; pleasant and cooperative, NAD, obese   95% 02 2L    SKIN: psoriatic crusting, SEVERE NODES: no lymphadenopathy HEENT: [place]/AT, EOM- WNL, Conjuctivae- clear, PERRLA, TM-WNL, Nose- clear, Throat- clear and wnl, dentures, Mallampati  II NECK: Supple w/ fair ROM, JVD- none, normal carotid impulses w/o bruits Thyroid- normal to palpation CHEST: diminished with wheeze/ COUGH, UNLABORED,   bilateral mild rhonchi.  Marland Kitchen HEART: RRR- rapid, no m/g/r heard ABDOMEN: obese. ZOX:WRUE, nl pulses,stasis changes pretibial , NO EDEMA NEURO: Grossly intact to observation     Impression & Recommendations:  Problem # 1:  TOBACCO USER (ICD-305.1)  Continued encouragement to reduce and stop smoking. She tested normal for the A1AT suffered by some of her family, but her obesity and smoking substantially reduce her respiratory reserve.   Problem # 2:  BRONCHITIS, CHRONIC (ICD-491.9)  Significant chronic bronchits. We discussed Daliresp but I don't want to add too much when she is on methotrexate.  She agrees to flu and pneumonia vaccine.   Problem # 3:  INSOMNIA (ICD-780.52)  We discussed and will try samples of Silenor  Her updated medication list for this problem includes:    Silenor 6 Mg Tabs (Doxepin hcl) .Marland Kitchen... 1 at bedtime for sleep  Medications Added to Medication List This Visit: 1)  Methotrexate 2.5 Mg Tabs (Methotrexate sodium) .... Take 6 by mouth once daily x 1 week and then take 7 by mouth once daily x 1 week 2)  Uloric 40 Mg Tabs (Febuxostat) .... Take 1 by mouth once  daily 3)  Doxepin Hcl 10 Mg Caps (Doxepin hcl) .... Take 1-2 by mouth at bedtime 4)  Colcrys 0.6 Mg Tabs (Colchicine) .... 2 by mouth two times a day x 2 days then once daily x 2 days. 5)  Silenor 6 Mg Tabs (Doxepin hcl) .Marland Kitchen.. 1 at bedtime for sleep  Other Orders: Est. Patient Level IV (45409) Flu Vaccine 57yrs + MEDICARE PATIENTS (W1191) Administration Flu vaccine - MCR (G0008) Pneumococcal Vaccine (47829) Admin 1st Vaccine (56213)  Patient Instructions: 1)  Please schedule a follow-up appointment in 4 months. 2)  Samples Silenor 6 mg, 1 each night for sleep 3)  Flu vax 4)  Pneumovax 5)  Keep on with the CPAP and oxygen every night Prescriptions: SILENOR 6 MG TABS (DOXEPIN HCL) 1 at bedtime for sleep  #1 pack x 0   Entered and Authorized by:   Waymon Budge MD   Signed by:   Waymon Budge MD on 07/14/2010   Method used:   Samples Given   RxID:   [id number]    Immunizations Administered:  Pneumonia Vaccine:    Vaccine Type: Pneumovax (Medicare)    Site: right deltoid    Mfr: Merck    Dose: 0.5 ml **Note De-Identified [place] Obfuscation** Route: IM    Given by: Reynaldo Minium CMA    Exp. Date: 12/18/2011    Lot #: [id number]    VIS given: 09/05/09 version given July 14, 2010. Flu Vaccine Consent Questions     Do you have a history of severe allergic reactions to this vaccine? no    Any prior history of allergic reactions to egg and/or gelatin? no    Do you have a sensitivity to the preservative Thimersol? no    Do you have a past history of Guillan-Barre Syndrome? no    Do you currently have an acute febrile illness? no    Have you ever had a severe reaction to latex? no    Vaccine information given and explained to patient? yes    Are you currently pregnant? no    Lot Number:[id number] Reynaldo Minium CMA  July 14, 2010 12:03 PM    Exp Date:03/31/2011   Site Given  Left Deltoid [id number]]       .lbmedflu

## 2010-10-31 NOTE — Procedures (Signed)
**Note De-Identified [place] Obfuscation** Summary: Colon Prep  Colon Prep   Imported By: Lester Parlier 08/11/2010 10:35:36  _____________________________________________________________________  External Attachment:    Type:   Image     Comment:   External Document

## 2010-10-31 NOTE — Assessment & Plan Note (Signed)
**Note De-Identified Sedam Obfuscation** Summary: 27m reck/lw   Primary Provider/Referring Provider:  Nyland  CC:  1 month follow up visit-review PFT and .Marland Kitchen  History of Present Illness: January 05, 2010- 53 yoF former patient last seen in 2003 by Dr Shelle Iron for OSA. Comes now with sister, patient of mine, asking evaluation of breathing. Smokes 1.5 ppd. Hx OSA with obesity/ hypoventilation. Never got used to BiPAP and quit. Has continued home O2 for sleep. Daily cough and clear to yellow sputum. Occasional streak of blood "from sinuses" has been noted unchanged over several years. Easy DOE with ADLs, housework. Now on Cipro and Flagyl. Eyes and nose water- worst in Spring pollen. Hx hosp x several for pneumonias. Has not had pneumovax. Using Symbicort. 2 sisters with alpha-1-antitrypsin deficiency on replacement. She has no hx of liver disease.  Feb 07, 2010- Bronchitis, breast mass, ? a1ATdef, Allergic rhinitis Breast lump-She had work up by Dr Daphine Deutscher including MM and ultrasound - she says it turned out this was just a bruise from fall out of bed.. I explained inheritance of a1AD. Her level is normal at 151. We won't spend the money to get phenotype, but with this score she is probably not a heterozygote. She had had phlebotomy for polycythemia- now normalized. Dr Ubaldo Glassing- heme onc. She continues injections for for psoriasis. She uses oxygen at night. Not using CPAP- "uncomfortable". She uses oxygen at 2. She wakes unable to breathe well, sits on side of bed and falls over when she falls asleep again. Continues to smoke. We discussed smoking cessation and support.Marland Kitchen PFT-mod restriction TLC 70%, likely obesity/hypoventilation.. No response to dilator. Probable obstruction in small airways. DLCO 43%. FEV1 has declined from 1.24 L 07/31/00 to 1.12 L as of 01/23/10.     Current Medications (verified): 1)  Symbicort 160-4.5 Mcg/act Aero (Budesonide-Formoterol Fumarate) .... 2 Puffs Two Times A Day 2)  Crestor 10 Mg Tabs (Rosuvastatin  Calcium) .... Take 1 By Mouth Once Daily 3)  Lisinopril-Hydrochlorothiazide 10-12.5 Mg Tabs (Lisinopril-Hydrochlorothiazide) .... Take 1 By Mouth Once Daily 4)  Omeprazole 20 Mg Cpdr (Omeprazole) .... Take 1 By Mouth Once Daily 5)  Flagyl 500 Mg Tabs (Metronidazole) .... Three Times A Day 6)  Ativan 0.5 Mg Tabs (Lorazepam) .... Take 1 By Mouth Once Daily As Needed 7)  Cipro 500 Mg Tabs (Ciprofloxacin Hcl) .... Two Times A Day Til Gone 8)  Aspirin 81 Mg Tbec (Aspirin) .... Take 1 By Mouth Once Daily 9)  Oxygen .... 2 Liters At Night 10)  Fluticasone Propionate 50 Mcg/act Susp (Fluticasone Propionate) .... 2 Sprays in Each Nostril Daily  Allergies (verified): No Known Drug Allergies  Past History:  Past Surgical History: Last updated: 12/30/2009 total hysterectomy cholecystectomy thyroidectomy for thyroid cancer Appendectomy carpal tunnel left shoulder spurs right lower leg- ORIF cholesteatoma right ear  Family History: Last updated: 12/30/2009 Emphysema-sisters- 2 with a1AT deficiency on replacement. allergies-sister asthma-sister heart disease-mother,father,sister's brother Associate Professor, brother GM died TB  Social History: Last updated: 12/30/2009 Married with children smoker-1.5ppd disabled  Past Medical History: Tachyarrythmia Tobacco abuse Chronic bronchitis Hx thyroic cancer- left partial thyroidectomy rhinitis Obstrucive sleep apnea polymyalgia diverticulits DGD spine psoriasis- monthly injection- Dr Terri Piedra  Review of Systems      See HPI       The patient complains of dyspnea on exertion and prolonged cough.  The patient denies anorexia, fever, weight loss, weight gain, vision loss, decreased hearing, hoarseness, chest pain, syncope, peripheral edema, headaches, hemoptysis, abdominal pain, and severe indigestion/heartburn. **Note De-Identified Hurta Obfuscation** Vital Signs:  Patient profile:   55 year old female Height:      65 inches Weight:      259 pounds BMI:      43.26 O2 Sat:      84 % on Room air Pulse rate:   117 / minute BP sitting:   130 / 70  (left arm) Cuff size:   large  Vitals Entered By: Reynaldo Minium CMA (Feb 07, 2010 9:12 AM)  O2 Flow:  Room air CC: 1 month follow up visit-review PFT and . Comments Rechecked pt's O2 2 min post entering exam room: 84% RA; pt was placed on 2L/M and rechecked:  92%2L/M   Physical Exam  Additional Exam:  General: A/Ox3; pleasant and cooperative, NAD, obese      She arrived with room air sat84% . We put on 2 L O2 here 92%. SKIN: psoriatic crusting NODES: no lymphadenopathy HEENT: [place]/AT, EOM- WNL, Conjuctivae- clear, PERRLA, TM-WNL, Nose- clear, Throat- clear and wnl, dentures, Mallampati  II NECK: Supple w/ fair ROM, JVD- none, normal carotid impulses w/o bruits Thyroid- normal to palpation CHEST: diminished with wheeze,     . HEART: RRR- rapid, no m/g/r heard ABDOMEN: Tender to pressure under left lower anterior rib line---no mass or spleen tip felt. ZOX:WRUE, nl pulses,stasis changes pretibial  NEURO: Grossly intact to observation   Attempted exercise oximetry. She walked only 48 meters, with chest discomfort and dizziness. After resting on supplemental oxygen here, she was able to leave with resting sat 91% and feeling better.   Impression & Recommendations:  Problem # 1:  BRONCHITIS, CHRONIC (ICD-491.9)  PFT best fits combination of obesity hypoventilation with restriction, and COPD with ongoing smoking. We will change her O2 to continuous and portable. Note that resting oxygen saturation on room air today was 84% on arrival.  Problem # 2:  OBSTRUCTIVE SLEEP APNEA (ICD-327.23)  She gets O2 from [employer]. But she doesn't like that company. We will get autotitration done to re-evaluate. We can also reassess her oxygen.  Medications Added to Medication List This Visit: 1)  Fluticasone Propionate 50 Mcg/act Susp (Fluticasone propionate) .... 2 sprays in each nostril daily  Other Orders: Est.  Patient Level III (45409) DME Referral (DME) DME Referral (DME)  Patient Instructions: 1)  Please schedule a follow-up appointment in 1 month. 2)  We will get paper chart for old sleep study. We will change oxygen to continuous and portable at 2 L/m. We will also see about getting an autotitration sleep study at home, to refit your CPAP for sleep apnea and we can change to another company. 3)  you need to be wearing oxygen continuously at 2 L/M 4)  Please don't smoke!!

## 2010-10-31 NOTE — Letter (Signed)
**Note De-Identified Carthen Obfuscation** Summary: [facility] Instructions  [facility]  [address] [place], [place] [address]   Phone: [phone]  Fax: [phone]       Christyna Flye    1956-06-06    MRN: [id number]        Procedure Day /DateTHURSDAY, 09/07/10     Arrival Time:10:45 AM     Procedure Time:12:45 PM     Location of Procedure:                     [facility] ( Outpatient Registration)   PREPARATION FOR COLONOSCOPY WITH MOVIPREP/ENDO WITH PROPOFUL   Starting 5 days prior to your procedure 12/3/11do not eat nuts, seeds, popcorn, corn, beans, peas,  salads, or any raw vegetables.  Do not take any fiber supplements (e.g. Metamucil, Citrucel, and Benefiber).  THE DAY BEFORE YOUR PROCEDURE         DATE: 09/06/10  DAY: WED  1.  Drink clear liquids the entire day-NO SOLID FOOD  2.  Do not drink anything colored red or purple.  Avoid juices with pulp.  No orange juice.  3.  Drink at least 64 oz. (8 glasses) of fluid/clear liquids during the day to prevent dehydration and help the prep work efficiently.  CLEAR LIQUIDS INCLUDE: Water Jello Ice Popsicles Tea (sugar ok, no milk/cream) Powdered fruit flavored drinks Coffee (sugar ok, no milk/cream) Gatorade Juice: apple, white grape, white cranberry  Lemonade Clear bullion, consomm, broth Carbonated beverages (any kind) Strained chicken noodle soup Hard Candy                             4.  In the morning, mix first dose of MoviPrep solution:    Empty 1 Pouch A and 1 Pouch B into the disposable container    Add lukewarm drinking water to the top line of the container. Mix to dissolve    Refrigerate (mixed solution should be used within 24 hrs)  5.  Begin drinking the prep at 5:00 p.m. The MoviPrep container is divided by 4 marks.   Every 15 minutes drink the solution down to the next mark (approximately 8 oz) until the full liter is complete.   6.  Follow completed prep with 16 oz of clear liquid of your choice (Nothing  red or purple).  Continue to drink clear liquids until bedtime.  7.  Before going to bed, mix second dose of MoviPrep solution:    Empty 1 Pouch A and 1 Pouch B into the disposable container    Add lukewarm drinking water to the top line of the container. Mix to dissolve    Refrigerate  THE DAY OF YOUR PROCEDURE      DATE:09/07/10 DAY: THURSDAY Beginning at 7:45 a.m. (5 hours before procedure):         1. Every 15 minutes, drink the solution down to the next mark (approx 8 oz) until the full liter is complete.  2. Follow completed prep with 16 oz. of clear liquid of your choice.    3. NOTHING TO EAT OR DRINK AFTER MIDNIGHT EXCEPT FOR PREP   MEDICATION INSTRUCTIONS  Unless otherwise instructed, you should take regular prescription medications with a small sip of water   as early as possible the morning of your procedure.         OTHER INSTRUCTIONS  You will need a responsible adult at least 57 years of age to accompany **Note De-Identified En[place]er Obfuscation** you and drive you home.   This person must remain in the waiting room during your procedure.  Wear loose fitting clothing that is easily removed.  Leave jewelry and other valuables at home.  However, you may wish to bring a book to read or  an iPod/MP3 player to listen to music as you wait for your procedure to start.  Remove all body piercing jewelry and leave at home.  Total time from sign-in until discharge is approximately 2-3 hours.  You should go home directly after your procedure and rest.  You can resume normal activities the  day after your procedure.  The day of your procedure you should not:   Drive   Make legal decisions   Operate machinery   Drink alcohol   Return to work  You will receive specific instructions about eating, activities and medications before you leave.    The above instructions have been reviewed and explained to me by   _______________________    I fully understand and can verbalize these instructions  _____________________________ Date _________

## 2010-10-31 NOTE — Progress Notes (Signed)
**Note De-Identified Zeringue Obfuscation** Summary: CPAP autotitrated to 13  Phone Note Other Incoming   Summary of Call: CPAP download- Auto to 13 cwp, AHI 1.1/hr. Will set fixed pressure at 13. Initial call taken by: Waymon Budge MD,  April 14, 2010 8:11 PM  Follow-up for Phone Call        order sent to [facility] to set cpap@13cm   Follow-up by: Oneita Jolly,  April 17, 2010 8:34 AM    New/Updated Medications: * CPAP 13 [facility]

## 2010-10-31 NOTE — Assessment & Plan Note (Signed)
**Note De-Identified Sawdey Obfuscation** Summary: 6 min walk   Nurse Visit   Vital Signs:  Patient profile:   55 year old female Pulse rate:   98 / minute BP sitting:   116 / 64  Allergies: No Known Drug Allergies  Orders Added: 1)  Pulmonary Stress (6 min walk) [94620]    Six Minute Walk Test Medications taken before test(dose and time): none per pt Supplemental oxygen during the test: No  Lap counter(place a tick mark inside a square for each lap completed) lap 1 complete   Baseline  BP sitting: 116/ 64 Heart rate: 98 Dyspnea ( Borg scale) 2 Fatigue (Borg scale) 3 SPO2 86 ra, 89 ra after 5 mins and deep breathing  End Of Test  BP sitting: 128/ 78 Heart rate: 124 Dyspnea ( Borg scale) 3 Fatigue (Borg scale) 3 SPO2 84 ra  2 Minutes post  BP sitting: 112/ 64 Heart rate: 98 SPO2 93 2L cont  Stopped or paused before six minutes? Yes Reason: due to pt's low sats at beginning of test, I walked with her while holding oximeter.  at approx 39 meters, pt's sat dropped to 84% ra.  immediately placed on 2L cont oxygen and allowed her to rest x2 minutes. Other symptoms at end of exercise: Angina, Dizziness  Interpretation: Number of laps  1 X 48 meters =   48 meters+ final partial lap: 0 meters =    48 meters   Total distance walked in six minutes: 48 meters  Tech ID: Boone Master CNA (January 23, 2010 11:00 AM) Tech Comments upon checking pt's first set of VS, pt's sat was 86% ra, while resting x66minutes.  spoke with Florentina Addison about whether or not to continue test.  it was decided to allow pt to rest an additional 5 minutes.  at 5 minutes, pt still sitting at 86-87% ra.  encouraged deep breathing x1 minute and pt's o2 sat increased to 89% ra.  i left the oximeter on pt's finger while walking with her.  at approx 39 meters pt's o2 dropped down to 84%.  Florentina Addison was available and grabbed an o2 tank while pt and i made it to the chair at the mark (end of the lap).  pt immediately placed on 2L cont oxygen.  pt c/o  chest discomfort and dizziness.  after rest, pt's o2 increased to 93% and the angina/dizziness lessened but were still present.  pt did state these are normal occurances for her w/ activity.  test was not continued d/t pt's continued complaints of chest discomfort and dizziness.  allowed pt to rest another 5 minutes, at the end of which she stated that the angina and dizziness had lessened more.  removed o2 cannula and escorted pt to lobby.  with pt's dizziness still present, pt made to wait in lobby x61minutes.  when i checked on pt, she stated that the dizziness was "much better" and o2 was 91% ra, HR 98.  allowed pt to leave, but encouraged her to call if any symptoms return or do not improve.  pt verbalized her understanding.

## 2010-10-31 NOTE — Progress Notes (Signed)
**Note De-Identified Maresh Obfuscation** Summary: [facility] CPAP vs BiPAP  Phone Note Other Incoming   Summary of Call: Christoper Allegra notes that patient has a BiPAP machine she hasn't used in 6 years, so they question order for autotitration of CPAP.  Treating for mild OSA, I would favor assessing CPAP first. If it won't control, then we can justify BiPAP. Initial call taken by: Waymon Budge MD,  Feb 13, 2010 5:00 PM  Follow-up for Phone Call        notified [facility] to do auto cpap as ordered by DR YOUNG  Follow-up by: Oneita Jolly,  Feb 14, 2010 9:19 AM

## 2010-10-31 NOTE — Progress Notes (Signed)
**Note De-Identified Bajaj Obfuscation** Summary: cpap download----Noncompliant so far  Phone Note From Other Clinic   Caller: courtney w/ [facility] Call For: young Summary of Call: re: download auto titration: there's only 5 days worth of data. caller says they are taking this back to pt's home next tues for a 2 wk titration. courtney [phone] Initial call taken by: Tivis Ringer, CNA,  March 17, 2010 12:51 PM  Follow-up for Phone Call        Spoke with Toni Amend.  Per Toni Amend, the pt's download has only 5 days on it from June 8-13.  She called pt and was told the machine was unplugged the rest of the days.  Toni Amend states they are taking machine back to pt on Tuesday to repeat the titration study.  Will forward message to CY as FYI.  No call back needed to [place] unless this is not ok.   Follow-up by: Gweneth Dimitri RN,  March 17, 2010 12:56 PM  Additional Follow-up for Phone Call Additional follow up Details #1::        Noted- patient noncompliant with cpap so far. Additional Follow-up by: Waymon Budge MD,  March 20, 2010 1:26 PM

## 2010-10-31 NOTE — Medication Information (Signed)
**Note De-Identified Presti Obfuscation** Summary: [facility]  [facility]   Imported By: Lester Osborn 02/21/2010 10:52:35  _____________________________________________________________________  External Attachment:    Type:   Image     Comment:   External Document

## 2010-11-02 NOTE — Procedures (Addendum)
**Note De-Identified Massmann Obfuscation** Summary: Colonoscopy  Patient: [name] Note: All result statuses are Final unless otherwise noted.  Tests: (1) Colonoscopy (COL)   COL Colonoscopy           DONE     [facility]     [address] [place], [place]  [address]           COLONOSCOPY PROCEDURE REPORT           PATIENT:  Hailey Hernandez, Hailey Hernandez  MR#:  [id number]     BIRTHDATE:  04/18/1956, 54 yrs. old  GENDER:  female     ENDOSCOPIST:  Wilhemina Bonito. Eda Keys, MD     REF. BY:  Joette Catching, M.D.     PROCEDURE DATE:  10/19/2010     PROCEDURE:  Colonoscopy with snare polypectomy x 4     ASA CLASS:  Class III     INDICATIONS:  colorectal cancer screening, average risk     MEDICATIONS:   See Anesthesia Report.           DESCRIPTION OF PROCEDURE:   After the risks benefits and     alternatives of the procedure were thoroughly explained, informed     consent was obtained.  Digital rectal exam was performed and     revealed no abnormalities.   The EC-[id number] ([id number]) endoscope was     introduced through the anus and advanced to the cecum, which was     identified by both the appendix and ileocecal valve, without     limitations. Time = 5:00 min.  The quality of the prep was     excellent, using MoviPrep.  The instrument was then slowly     withdrawn (time  20:00 min) as the colon was fully examined.     <<PROCEDUREIMAGES>>           FINDINGS:  Four polyps were found in the transverse (76mm,4mm) and     sigmoid (53mm,4mm) colon. Polyps were snared without cautery.     Retrieval was successful in 3/4.  Moderate diverticulosis was     found in the left colon.  Two lipomas were found in the cecum.     Retroflexed views in the rectum revealed internal hemorrhoids.     The scope was then withdrawn from the patient and the procedure     completed.           COMPLICATIONS:  None     ENDOSCOPIC IMPRESSION:     1) Four polyps - removed     2) Moderate diverticulosis in the left colon     3) Lipomas in the cecum     4)  Internal hemorrhoids           RECOMMENDATIONS:     1) Follow up colonoscopy in  3, 5, or 10 years pending path           ______________________________     Wilhemina Bonito. Eda Keys, MD           CC:  Joette Catching, M.D.; The Patient           n.     eSIGNED:   Wilhemina Bonito. Eda Keys at 10/19/2010 09:48 AM           Spratlin, Wynona Canes, [id number]  Note: An exclamation mark (!) indicates a result that was not dispersed into the flowsheet. Document Creation Date: 10/19/2010 9:48 AM _______________________________________________________________________  (1) Order result status: Final Collection or observation date-time: 10/19/2010 **Note De-Identified Guinta Obfuscation** 09:31 Requested date-time:  Receipt date-time:  Reported date-time:  Referring Physician:   Ordering Physician: Fransico Setters [phone]) Specimen Source:  Source: Launa Grill Order Number: [id number] Lab site:

## 2010-11-02 NOTE — Procedures (Signed)
**Note De-Identified Althoff Obfuscation** Summary: Upper Endoscopy  Patient: [name] Note: All result statuses are Final unless otherwise noted.  Tests: (1) Upper Endoscopy (EGD)   EGD Upper Endoscopy       DONE     [facility]     [address] [place], [place]  [address]           ENDOSCOPY PROCEDURE REPORT           PATIENT:  Hailey Hernandez, Hailey Hernandez  MR#:  [id number]     BIRTHDATE:  13-Aug-1956, 54 yrs. old  GENDER:  female           ENDOSCOPIST:  Wilhemina Bonito. Eda Keys, MD     Referred by:  Joette Catching, M.D.           PROCEDURE DATE:  10/19/2010     PROCEDURE:  EGD, diagnostic 43235,     Maloney Dilation of Esophagus -     39F     ASA CLASS:  Class III     INDICATIONS:  dysphagia, GERD, abdominal pain           MEDICATIONS:   See Anesthesia Report.     TOPICAL ANESTHETIC:  Exactacain Spray           DESCRIPTION OF PROCEDURE:   After the risks benefits and     alternatives of the procedure were thoroughly explained, informed     consent was obtained.  The Pentax Gastroscope [id number] endoscope     was introduced through the mouth and advanced to the third portion     of the duodenum, without limitations.  The instrument was slowly     withdrawn as the mucosa was fully examined.     <<PROCEDUREIMAGES>>           A large caliber esophageal ring was found in the distal esophagus.     Otherwise normal esophagus.  The stomach was entered and closely     examined. The antrum, angularis, and lesser curvature were well     visualized, including a retroflexed view of the cardia and fundus.     The stomach wall was normally distensable. The scope passed easily     through the pylorus into the duodenum.  The duodenal bulb was     normal in appearance, as was the postbulbar duodenum.     Retroflexed views revealed a hiatal hernia.    The scope was then     withdrawn from the patient and the procedure completed.           THERAPY: 39F Maloney dilation  w/o resistance or heme           COMPLICATIONS:  None           ENDOSCOPIC IMPRESSION:     1) Ring, esophageal in the distal esophagus - s/p dilaton     2) Otherwise normal esophagus     3) Normal stomach     4) Normal duodenum     5) GERD           RECOMMENDATIONS:     1) Clear liquids until 12 noon then soft foods rest of day.     Resume prior diet tomorrow.     2) Prilosec 20mg  daily     3) Anti-reflux regimen to be follow     4) follow-up prn           ______________________________     Wilhemina Bonito. Eda Keys, MD **Note De-Identified Zee Obfuscation** CC:  Joette Catching, M.D. ; The Patient           n.     eSIGNED:   Wilhemina Bonito. Eda Keys at 10/19/2010 09:57 AM           Rieger, Wynona Canes, [id number]  Note: An exclamation mark (!) indicates a result that was not dispersed into the flowsheet. Document Creation Date: 10/19/2010 9:58 AM _______________________________________________________________________  (1) Order result status: Final Collection or observation date-time: 10/19/2010 09:48 Requested date-time:  Receipt date-time:  Reported date-time:  Referring Physician:   Ordering Physician: Fransico Setters [phone]) Specimen Source:  Source: Launa Grill Order Number: [id number] Lab site:

## 2010-11-02 NOTE — Progress Notes (Signed)
**Note De-Identified Nichelson Obfuscation** Summary: Hospital procedure  Phone Note Call from Patient Call back at Home Phone [phone]   Caller: Patient Call For: Dr. Marina Goodell Reason for Call: Talk to Nurse Summary of Call: pt has prep questions Initial call taken by: [place] Johnson,  October 18, 2010 9:18 AM  Follow-up for Phone Call        Instructions reviewed and questions of prep time's and arrival answered. Follow-up by: Teryl Lucy RN,  October 18, 2010 12:02 PM

## 2010-11-02 NOTE — Letter (Addendum)
**Note De-Identified Riccobono Obfuscation** Summary: Patient Notice- Polyp Results  [facility]  [address] [place], [place] [address]   Phone: [phone]  Fax: [phone]        October 20, 2010 MRN: [id number]    [facility] [address] [place], [place]  [address]    Dear Hailey Hernandez,  I am pleased to inform you that the colon polyp(s) removed during your recent colonoscopy was (were) found to be benign (no cancer detected) upon pathologic examination.  I recommend you have a repeat colonoscopy examination in 10 years to look for recurrent polyps, as having colon polyps increases your risk for having recurrent polyps or even colon cancer in the future.  Should you develop new or worsening symptoms of abdominal pain, bowel habit changes or bleeding from the rectum or bowels, please schedule an evaluation with either your primary care physician or with me.  Additional information/recommendations:  __ No further action with gastroenterology is needed at this time. Please      follow-up with your primary care physician for your other healthcare      needs.    Please call us if you are having persistent problems or have questions about your condition that have not been fully answered at this time.  Sincerely,  Hilarie Fredrickson MD  This letter has been electronically signed by your physician.  Appended Document: Patient Notice- Polyp Results Letter mailed to pt.

## 2010-11-17 ENCOUNTER — Ambulatory Visit: Payer: Self-pay | Admitting: Internal Medicine

## 2010-11-17 ENCOUNTER — Encounter: Payer: Self-pay | Admitting: Cardiology

## 2010-12-13 ENCOUNTER — Encounter: Payer: Self-pay | Admitting: Cardiology

## 2010-12-13 ENCOUNTER — Ambulatory Visit (INDEPENDENT_AMBULATORY_CARE_PROVIDER_SITE_OTHER): Payer: Medicare Other | Admitting: Cardiology

## 2010-12-13 DIAGNOSIS — R55 Syncope and collapse: Secondary | ICD-10-CM

## 2010-12-15 ENCOUNTER — Encounter (INDEPENDENT_AMBULATORY_CARE_PROVIDER_SITE_OTHER): Payer: Self-pay | Admitting: *Deleted

## 2010-12-15 ENCOUNTER — Encounter: Payer: Self-pay | Admitting: Internal Medicine

## 2010-12-15 ENCOUNTER — Other Ambulatory Visit: Payer: Self-pay | Admitting: Internal Medicine

## 2010-12-15 ENCOUNTER — Ambulatory Visit (INDEPENDENT_AMBULATORY_CARE_PROVIDER_SITE_OTHER)
Admission: RE | Admit: 2010-12-15 | Discharge: 2010-12-15 | Disposition: A | Discharge status: Home / Self Care | Payer: Medicare Other | Source: Ambulatory Visit | Attending: Internal Medicine | Admitting: Internal Medicine

## 2010-12-15 ENCOUNTER — Ambulatory Visit (INDEPENDENT_AMBULATORY_CARE_PROVIDER_SITE_OTHER): Payer: Medicare Other | Admitting: Internal Medicine

## 2010-12-15 ENCOUNTER — Other Ambulatory Visit: Payer: Medicare Other

## 2010-12-15 DIAGNOSIS — R55 Syncope and collapse: Secondary | ICD-10-CM

## 2010-12-15 DIAGNOSIS — J42 Unspecified chronic bronchitis: Secondary | ICD-10-CM

## 2010-12-15 DIAGNOSIS — F17200 Nicotine dependence, unspecified, uncomplicated: Secondary | ICD-10-CM

## 2010-12-15 DIAGNOSIS — E662 Morbid (severe) obesity with alveolar hypoventilation: Secondary | ICD-10-CM

## 2010-12-15 DIAGNOSIS — R0609 Other forms of dyspnea: Secondary | ICD-10-CM

## 2010-12-15 DIAGNOSIS — R0989 Other specified symptoms and signs involving the circulatory and respiratory systems: Secondary | ICD-10-CM

## 2010-12-15 LAB — CBC WITH DIFFERENTIAL/PLATELET
Basophils Absolute: 0.1 10*3/uL (ref 0.0–0.1)
Basophils Relative: 0.6 % (ref 0.0–3.0)
Eosinophils Absolute: 0.3 10*3/uL (ref 0.0–0.7)
Eosinophils Relative: 2.4 % (ref 0.0–5.0)
HCT: 47.3 % — ABNORMAL HIGH (ref 36.0–46.0)
Hemoglobin: 16.2 g/dL — ABNORMAL HIGH (ref 12.0–15.0)
Lymphocytes Relative: 18.3 % (ref 12.0–46.0)
Lymphs Abs: 2.3 10*3/uL (ref 0.7–4.0)
MCHC: 34.3 g/dL (ref 30.0–36.0)
MCV: 100.2 fl — ABNORMAL HIGH (ref 78.0–100.0)
Monocytes Absolute: 0.6 10*3/uL (ref 0.1–1.0)
Monocytes Relative: 4.7 % (ref 3.0–12.0)
Neutro Abs: 9.3 10*3/uL — ABNORMAL HIGH (ref 1.4–7.7)
Neutrophils Relative %: 74 % (ref 43.0–77.0)
Platelets: 246 10*3/uL (ref 150.0–400.0)
RBC: 4.72 Mil/uL (ref 3.87–5.11)
RDW: 15.9 % — ABNORMAL HIGH (ref 11.5–14.6)
WBC: 12.6 10*3/uL — ABNORMAL HIGH (ref 4.5–10.5)

## 2010-12-15 LAB — BASIC METABOLIC PANEL
BUN: 15 mg/dL (ref 6–23)
CO2: 32 mEq/L (ref 19–32)
Calcium: 9.2 mg/dL (ref 8.4–10.5)
Chloride: 100 mEq/L (ref 96–112)
Creatinine, Ser: 0.8 mg/dL (ref 0.4–1.2)
GFR: 74.31 mL/min (ref >60.00)
Glucose, Bld: 134 mg/dL — ABNORMAL HIGH (ref 70–99)
Potassium: 4 mEq/L (ref 3.5–5.1)
Sodium: 140 mEq/L (ref 135–145)

## 2010-12-15 LAB — BRAIN NATRIURETIC PEPTIDE: Pro B Natriuretic peptide (BNP): 47.5 pg/mL (ref 0.0–100.0)

## 2010-12-19 NOTE — Assessment & Plan Note (Signed)
**Note De-Identified Crammer Obfuscation** Summary: np6/dx:syncope per Tammy with Dr. Merri Ray office...office# [id number]...   Visit Type:  Initial Consult Primary Provider:  Joette Catching, MD  CC:  syncope.  History of Present Illness: The patient presents for evaluation of syncope.  She has no past cardiac history but has a history of chronic lung disease O2 dependent and ongoing tobacco abuse. She had a single episode of this month ago while walking into a store. She had no prodrome. She had no palpitations, presyncope or syncope. She had no chest pressure, neck or arm discomfort. She had no seizure activity, loss of bowel or bladder. There was some bruising from trauma. She had not had this problem before and has had none since. She does not have orthostatic symptoms. She has had no prior cardiac history and denies chest pressure, neck or arm discomfort. She does have chronic lower extremity swelling. She sleeps with CPAP and sometimes has to get up because she short of breath. She is not describing however PND  Current Medications (verified): 1)  Lisinopril 10 Mg Tabs (Lisinopril) .... One Tablet By Mouth Once Daily 2)  Omeprazole 20 Mg Cpdr (Omeprazole) .... Take 1 By Mouth Once Daily 3)  Ativan 0.5 Mg Tabs (Lorazepam) .... Take 1 By Mouth Once Daily As Needed 4)  Oxygen .... 2 Liters At Night 5)  Cpap 13 [facility] 6)  Methotrexate 2.5 Mg Tabs (Methotrexate Sodium) .... Uad 7)  Doxepin Hcl 10 Mg Caps (Doxepin Hcl) .... Take 1-2 By Mouth At Bedtime 8)  Colcrys 0.6 Mg Tabs (Colchicine) .... 2 By Mouth Two Times A Day X 2 Days Then Once Daily X 2 Days. 9)  Flovent Diskus 50 Mcg/blist Aepb (Fluticasone Propionate (Inhal)) .... Uad 10)  Symbicort 160-4.5 Mcg/act Aero (Budesonide-Formoterol Fumarate) .... Uad 11)  Folic Acid .Marland Kitchen.. 1 By Mouth Daily  Allergies (verified): No Known Drug Allergies  Past History:  Past Medical History: Tachyarrythmia Tobacco abuse Chronic bronchitis Hx thyroic cancer- left partial  thyroidectomy Rhinitis Obstructive sleep apnea Polymyalgia Diverticulits DGD spine Psoriasis- monthly injection- Dr Terri Piedra Anxiety Disorder Arthritis COPD Hyperlipidemia Hypertension  Past Surgical History: Total hysterectomy Cholecystectomy Thyroidectomy for thyroid cancer Appendectomy Carpal tunnel Left shoulder spurs Right lower leg- ORIF Cholesteatoma right ear  Family History: Emphysema-sisters- 2 with a1AT deficiency on replacement. Allergies-sister Asthma-sister Heart disease-mother,father,sister's brother Associate Professor, brother GM died TB  Social History: Married with children Smoker-1.5ppd x 40 years Disabled Alcohol Use - no Daily Caffeine Use  Review of Systems       As stated in the HPI and negative for all other systems.   Vital Signs:  Patient profile:   55 year old female Height:      65 inches Weight:      262 pounds BMI:     43.76 Pulse rate:   82 / minute Resp:     18 per minute BP sitting:   133 / 88  (right arm)  Vitals Entered By: Marrion Coy, CNA (December 13, 2010 9:26 AM)  Physical Exam  General:  Well developed, well nourished, in no acute distress.  Wearing O2 Head:  normocephalic and atraumatic Eyes:  PERRLA/EOM intact; conjunctiva and lids normal. Mouth:  Edentulous. Oral mucosa normal. Neck:  Neck supple, no JVD. No masses, thyromegaly or abnormal cervical nodes. Chest Wall:  no deformities or breast masses noted Lungs:  Clear bilaterally to auscultation and percussion. Abdomen:  Bowel sounds positive; abdomen soft and non-tender without masses, organomegaly, or hernias noted. No hepatosplenomegaly, morbidly obsese Msk:  Back **Note De-Identified Preuss Obfuscation** normal, normal gait. Muscle strength and tone normal. Extremities:  Mild edema Neurologic:  Alert and oriented x 3. Skin:  Chronic venous stasis changes lower extremities Cervical Nodes:  no significant adenopathy Axillary Nodes:  no significant adenopathy Inguinal Nodes:  no significant  adenopathy Psych:  Normal affect.   Detailed Cardiovascular Exam  Neck    Carotids: Carotids full and equal bilaterally without bruits.      Neck Veins: Normal, no JVD.    Heart    Inspection: no deformities or lifts noted.      Palpation: normal PMI with no thrills palpable.      Auscultation: regular rate and rhythm, S1, S2 without murmurs, rubs, gallops, or clicks.    Vascular    Abdominal Aorta: no palpable masses, pulsations, or audible bruits.      Femoral Pulses: normal femoral pulses bilaterally.      Pedal Pulses: normal pedal pulses bilaterally.      Radial Pulses: normal radial pulses bilaterally.      Peripheral Circulation: no clubbing, cyanosis, or edema noted with normal capillary refill.     EKG  Procedure date:  12/13/2010  Findings:      sinus tachycardia, rate 103, axis within normal limits, intervals within normal limits, no acute ST-T wave changes  Impression & Recommendations:  Problem # 1:  SYNCOPE (ICD-780.2) I suspect that this is related to her not wearing oxygen when she was out and probable hypoxemia. However, I will check an echocardiogram and a 24-hour event monitor to further evaluate. Further workup will be based on future symptoms with these results. Orders: Echocardiogram (Echo)  Problem # 2:  HYPERTENSION (ICD-401.9) Her blood pressure is controlled and she can continue the meds as listed.  Problem # 3:  TOBACCO USER (ICD-305.1) She continues to smoke but she understands and has been educated many times about the need to quit.  Other Orders: Holter Monitor (Holter Monitor)  Patient Instructions: 1)  Your physician recommends that you schedule a follow-up appointment as needed after testing 2)  Your physician recommends that you continue on your current medications as directed. Please refer to the Current Medication list given to you today. 3)  Your physician has requested that you have an echocardiogram.  Echocardiography is a  painless test that uses sound waves to create images of your heart. It provides your doctor with information about the size and shape of your heart and how well your heart's chambers and valves are working.  This procedure takes approximately one hour. There are no restrictions for this procedure. 4)  Your physician has recommended that you wear a holter monitor.  Holter monitors are medical devices that record the heart's electrical activity. Doctors most often use these monitors to diagnose arrhythmias. Arrhythmias are problems with the speed or rhythm of the heartbeat. The monitor is a small, portable device. You can wear one while you do your normal daily activities. This is usually used to diagnose what is causing palpitations/syncope (passing out). 5)  You have been diagnosed with syncope. Syncope is a condition in which your blood pressure drops too low and can result in fainting or blacking out.  Please see the handout/brochure given to you today for more information.

## 2010-12-19 NOTE — Assessment & Plan Note (Signed)
**Note De-Identified [place] Obfuscation** Summary: 4 mth/jrc   Primary Provider/Referring Provider:  Joette Catching, MD  CC:  4 month follow up visit-cough and congestion-yellow in color; recently had Zpak and another ABx (unsure of name) as well as Mucinex D..  History of Present Illness: July 14, 2010  Bronchitis/ COPD, tobacco, allergic rhinitis, Hx OSA/ noncompliant.......sister here Nurse cc: 4 month follow up visit-OSA and chronic bronchitis Now on methotrexate for psoriasis. Family has A1AT def but she doesn't. Noted that O2 sat at her primary office was 92 after walking, but drifted down into upper 80's while sitting very quietly.  Uses CPAP now regularly, worn with oxygen. Stil wakes during night and estimates she only sleeps soundly 3 hrs/ night. Blames joint pains for waking her, including gout. Off lisinopril. Coughing very little now and less wheeze and rattle. Phlegm clears from yellow as day goes on.  Still working on the smoking. Variable use of CPAP with compliance again discussed.   December 15, 2010-  Bronchitis/ COPD, tobacco, allergic rhinitis, Hx OSA/ noncompliant.......granddaughter here  Nurse-CC: 4 month follow up visit-cough and congestion-yellow in color; recently had Zpak and another ABx (unsure of name) as well as Mucinex D. 3 weeks ago had productive cough, watery eyes and nose. No fever, green or sore throat. Started Z pak, then  Dr Lysbeth Galas gave steroid taper, then another "stronger", then mucinex. Much better now, but still some sneeze and cough- yellow. Fell/ syncope at store 3 weeks ago- already sick then. Cramps in both calves, both arms and swelling some.  Dr Antoine Poche saw and is peniding syncope w/u incl monitor and echo. Denies fever, chills, sore throat. Finished prednione and antibiotic a week ago.    Preventive Screening-Counseling & Management  Alcohol-Tobacco     Smoking Status: current     Smoke Cessation Stage: ready     Packs/Day: 1.5     Tobacco Counseling: to quit use of tobacco  products  Current Medications (verified): 1)  Lisinopril 10 Mg Tabs (Lisinopril) .... One Tablet By Mouth Once Daily 2)  Omeprazole 20 Mg Cpdr (Omeprazole) .... Take 1 By Mouth Once Daily 3)  Ativan 0.5 Mg Tabs (Lorazepam) .... Take 1 By Mouth Once Daily As Needed 4)  Oxygen .... 2 Liters At Night 5)  Cpap 13 [employer] 6)  Methotrexate 2.5 Mg Tabs (Methotrexate Sodium) .... Uad 7)  Doxepin Hcl 10 Mg Caps (Doxepin Hcl) .... Take 1-2 By Mouth At Bedtime 8)  Symbicort 160-4.5 Mcg/act Aero (Budesonide-Formoterol Fumarate) .... Uad 9)  Folic Acid .Marland Kitchen.. 1 By Mouth Daily  Allergies (verified): 1)  ! * Savella 2)  ! * Vimovo  Past History:  Past Medical History: Last updated: 12/13/2010 Tachyarrythmia Tobacco abuse Chronic bronchitis Hx thyroic cancer- left partial thyroidectomy Rhinitis Obstructive sleep apnea Polymyalgia Diverticulits DGD spine Psoriasis- monthly injection- Dr Terri Piedra Anxiety Disorder Arthritis COPD Hyperlipidemia Hypertension  Past Surgical History: Last updated: 12/13/2010 Total hysterectomy Cholecystectomy Thyroidectomy for thyroid cancer Appendectomy Carpal tunnel Left shoulder spurs Right lower leg- ORIF Cholesteatoma right ear  Family History: Last updated: 12/13/2010 Emphysema-sisters- 2 with a1AT deficiency on replacement. Allergies-sister Asthma-sister Heart disease-mother,father,sister's brother Associate Professor, brother GM died TB  Social History: Last updated: 12/13/2010 Married with children Smoker-1.5ppd x 40 years Disabled Alcohol Use - no Daily Caffeine Use  Risk Factors: Smoking Status: current (12/15/2010) Packs/Day: 1.5 (12/15/2010)  Review of Systems      See HPI       The patient complains of syncope, dyspnea on exertion, peripheral edema, and prolonged cough. **Note De-Identified Hehir Obfuscation** The patient denies anorexia, fever, weight loss, weight gain, vision loss, decreased hearing, hoarseness, chest pain, headaches, hemoptysis, abdominal pain,  severe indigestion/heartburn, unusual weight change, abnormal bleeding, and enlarged lymph nodes.    Vital Signs:  Patient profile:   55 year old female Height:      65 inches Weight:      260.50 pounds BMI:     43.51 O2 Sat:      92 % on 2 L/min Pulse rate:   121 / minute BP sitting:   116 / 78  (left arm) Cuff size:   large  Vitals Entered By: Reynaldo Minium CMA (December 15, 2010 2:12 PM)  O2 Flow:  2 L/min CC: 4 month follow up visit-cough and congestion-yellow in color; recently had Zpak and another ABx (unsure of name) as well as Mucinex D.   Physical Exam  Additional Exam:  General: A/Ox3; pleasant and cooperative, NAD, obese   92% 02 2L    SKIN: psoriatic crusting, NODES: no lymphadenopathy HEENT: [place]/AT, EOM- WNL, Conjuctivae- clear, PERRLA, TM-WNL, Nose- clear, Throat- clear and wnl, dentures, Mallampati  II NECK: Supple w/ fair ROM, JVD- none, normal carotid impulses w/o bruits Thyroid- normal to palpation CHEST: diminished with wheeze/ COUGH, UNLABORED,   bilateral mild rhonchi.  Marland Kitchen HEART: RRR- rapid, no m/g/r heard ABDOMEN: obese. VHQ:IONG, nl pulses,stasis changes pretibial , NO EDEMA. ? Homan's- vague soreness NEURO: Grossly intact to observation     Impression & Recommendations:  Problem # 1:  SYNCOPE (ICD-780.2) She had been feeling weak and ill, so this may not have been a signal event, just orthostatic. Will check D dimer agaist PE possibility  Problem # 2:  BRONCHITIS, CHRONIC (ICD-491.9)  Acute exacerbation. She blamed allergy initially, but says this is atypical in that it has been going on so long, Possible overlap of viral bronchitis and seasonal allergy  Problem # 3:  TOBACCO USER (ICD-305.1) In spite of the difficult problems others in her family have had, she does not have any real intent to stop smokng at this time.   Other Orders: Est. Patient Level IV (29528) TLB-CBC Platelet - w/Differential (85025-CBCD) TLB-BMP (Basic Metabolic Panel-BMET)  (80048-METABOL) TLB-BNP (B-Natriuretic Peptide) (83880-BNPR) T-D-Dimer Fibrin Derivatives Quantitive (41324-40102) T-2 View CXR (71020TC) Admin of Therapeutic Inj  intramuscular or subcutaneous (72536) Depo- Medrol 80mg  (J1040) Nebulizer Tx (64403)  Patient Instructions: 1)  Please schedule a follow-up appointment in 1 month. 2)  neb xop 0.63 3)  depo 80 4)  lab 5)  A chest x-ray has been recommended.  Your imaging study may require preauthorization.       Medication Administration  Injection # 1:    Medication: Depo- Medrol 80mg     Diagnosis: BRONCHITIS, CHRONIC (ICD-491.9)    Route: SQ    Site: RUOQ gluteus    Exp Date: 03/2013    Lot #: [id number]    Mfr: Pharmacia    Patient tolerated injection without complications    Given by: Reynaldo Minium CMA (December 15, 2010 5:51 PM)  Medication # 1:    Medication: Xopenex 0.63mg     Diagnosis: BRONCHITIS, CHRONIC (ICD-491.9)    Dose: 1 vial    Route: inhaled    Exp Date: 03/2011    Lot #: [id number]    Mfr: sepracor    Patient tolerated medication without complications    Given by: Reynaldo Minium CMA (December 15, 2010 5:52 PM)  Orders Added: 1)  Est. Patient Level IV [63875] 2)  TLB-CBC Platelet - w/Differential [ **Note De-Identified Mares Obfuscation** 85025-CBCD] 3)  TLB-BMP (Basic Metabolic Panel-BMET) [80048-METABOL] 4)  TLB-BNP (B-Natriuretic Peptide) [83880-BNPR] 5)  T-D-Dimer Fibrin Derivatives Quantitive [16109-60454] 6)  T-2 View CXR [71020TC] 7)  Admin of Therapeutic Inj  intramuscular or subcutaneous [96372] 8)  Depo- Medrol 80mg  [J1040] 9)  Nebulizer Tx [09811]

## 2010-12-27 ENCOUNTER — Other Ambulatory Visit (HOSPITAL_COMMUNITY): Payer: Self-pay | Admitting: Cardiology

## 2010-12-27 DIAGNOSIS — R55 Syncope and collapse: Secondary | ICD-10-CM

## 2010-12-29 ENCOUNTER — Encounter (INDEPENDENT_AMBULATORY_CARE_PROVIDER_SITE_OTHER): Payer: Medicare Other

## 2010-12-29 ENCOUNTER — Ambulatory Visit (HOSPITAL_COMMUNITY): Payer: Medicare Other | Attending: Cardiology | Admitting: Radiology

## 2010-12-29 DIAGNOSIS — R002 Palpitations: Secondary | ICD-10-CM

## 2010-12-29 DIAGNOSIS — R55 Syncope and collapse: Secondary | ICD-10-CM | POA: Insufficient documentation

## 2011-01-11 ENCOUNTER — Telehealth: Payer: Self-pay | Admitting: Cardiology

## 2011-01-11 NOTE — Telephone Encounter (Signed)
**Note De-Identified Rawles Obfuscation** Echo results given to pt.  She has been asymptomatic and not having any problems.  "my blood pressure was high but I think I have it under control now" according to pt.  No bp numbers given.  She is aware of follow-up ECHO in 6 months. Mylo Red RN

## 2011-01-12 ENCOUNTER — Telehealth: Payer: Self-pay | Admitting: *Deleted

## 2011-01-12 NOTE — Telephone Encounter (Signed)
**Note De-Identified Largent Obfuscation** Patient notified per Dr. Antoine Poche, monitor results showed sinus rhythm with no etiology for syncope.  To return to office in 6 months.

## 2011-01-31 ENCOUNTER — Encounter: Payer: Self-pay | Admitting: Internal Medicine

## 2011-02-05 ENCOUNTER — Ambulatory Visit (INDEPENDENT_AMBULATORY_CARE_PROVIDER_SITE_OTHER): Payer: Medicare Other | Admitting: Internal Medicine

## 2011-02-05 ENCOUNTER — Encounter: Payer: Self-pay | Admitting: Internal Medicine

## 2011-02-05 VITALS — BP 126/80 | HR 89 | Ht 65.0 in | Wt 267.4 lb

## 2011-02-05 DIAGNOSIS — F17200 Nicotine dependence, unspecified, uncomplicated: Secondary | ICD-10-CM

## 2011-02-05 DIAGNOSIS — J42 Unspecified chronic bronchitis: Secondary | ICD-10-CM

## 2011-02-05 DIAGNOSIS — G4733 Obstructive sleep apnea (adult) (pediatric): Secondary | ICD-10-CM

## 2011-02-05 DIAGNOSIS — L408 Other psoriasis: Secondary | ICD-10-CM

## 2011-02-05 MED ORDER — COMPRESSOR/NEBULIZER MISC: Status: AC

## 2011-02-05 MED ORDER — ALBUTEROL SULFATE (2.5 MG/3ML) 0.083% IN NEBU: 2.5000 mg | INHALATION_SOLUTION | RESPIRATORY_TRACT | Status: DC | PRN

## 2011-02-05 NOTE — Assessment & Plan Note (Signed)
**Note De-Identified Volker Obfuscation** Good compliance and control now. We discussed how to talk with [employer] about new masks.

## 2011-02-05 NOTE — Patient Instructions (Signed)
**Note De-Identified Galyean Obfuscation** Continue present present treatments  Script for nebulizer and albuterol for it, to use occasionally if needed.

## 2011-02-05 NOTE — Assessment & Plan Note (Signed)
**Note De-Identified Sanks Obfuscation** She has cut down to 1.5 PPD. We encouraged continued effort.

## 2011-02-05 NOTE — Progress Notes (Signed)
**Note De-identified Gebhart Obfuscation**  **Note De-Identified Budzinski Obfuscation** Subjective:    Patient ID: Donato Heinz Thede, female    DOB: Apr 30, 1956, 55 y.o.   MRN: [id number]  HPI 02/05/11- 55 yoFwith chronic bronchitis, tobacco use, OSA, obesity hypoventilation syndrome. Last here December 15, 2010. Since then has continued Symbicort. Comes with sister today. Sleeping 2 nights ago- woke smothered. Had to pull off CPAP. She gradually cleared over several minutes. Not clear if she refluxed, had mucus plug or ??. Today is typical with no residual. Chest congestion varies day to day. Says cough better, not coughing up anything purulent or bloody. Still averaging 1.5 PPD, down from 3 PPD.  Compliant with CPAP- needs new mask.   Review of Systems See HPI Constitutional:   No weight loss, night sweats,  Fevers, chills, fatigue, lassitude. HEENT:   No headaches,  Difficulty swallowing,  Tooth/dental problems,  Sore throat,                No sneezing, itching, ear ache, nasal congestion, post nasal drip,   CV:  No chest pain,  Orthopnea, PND, swelling in lower extremities, anasarca, dizziness, palpitations  GI  No heartburn, indigestion, abdominal pain, nausea, vomiting, diarrhea, change in bowel habits, loss of appetite  Resp:  No coughing up of blood.  No change in color of mucus.  No wheezing.    Skin: no rash or lesions.  GU: no dysuria, change in color of urine, no urgency or frequency.  No flank pain.  MS:  No joint pain or swelling.  No decreased range of motion.  No back pain.  Psych:  No change in mood or affect. No depression or anxiety.  No memory loss.     Objective:   Physical Exam General- Alert, Oriented, Affect-appropriate, Distress- none acute    obese   O2  2L contiuous  Skin- Psoriasis  Lymphadenopathy- none  Head- atraumatic  Eyes- Gross vision intact, PERRLA, conjunctivae clear secretions  Ears-  Normal- hearing, canals, Tm   Nose- Clear,  No- Septal dev, mucus, polyps, erosion, perforation   Throat- Mallampati III-IV , mucosa clear ,  drainage- none, tonsils- atrophic  Neck- flexible , trachea midline, no stridor , thyroid nl, carotid no bruit  Chest - symmetrical excursion , unlabored     Heart/CV- RRR , no murmur , no gallop  , no rub, nl s1 s2                     - JVD- none , edema- none, stasis changes- none, varices- none     Lung- clear to P&A, wheeze- none, cough- none , dullness-none, rub- none     Chest wall-   Abd- tender-no, distended-no, bowel sounds-present, HSM- no  Br/ Gen/ Rectal- Not done, not indicated  Extrem- cyanosis- none, clubbing, none, atrophy- none, strength- nl  Neuro- grossly intact to observation         Assessment & Plan:

## 2011-02-05 NOTE — Assessment & Plan Note (Addendum)
**Note De-Identified Iannelli Obfuscation** Severe chronic bronchitis. We will manage with Symbicort and her oxygen as discussed.  CXR- last visit- reviewed- clear, NAD. It looks  Better than she sounds, c/w chronic bronchitis.

## 2011-02-06 MED ORDER — ALBUTEROL SULFATE (2.5 MG/3ML) 0.083% IN NEBU: 2.5000 mg | INHALATION_SOLUTION | RESPIRATORY_TRACT | Status: DC | PRN

## 2011-02-11 ENCOUNTER — Encounter: Payer: Self-pay | Admitting: Internal Medicine

## 2011-07-03 ENCOUNTER — Telehealth: Payer: Self-pay | Admitting: Internal Medicine

## 2011-07-03 MED ORDER — AZITHROMYCIN 250 MG PO TABS: ORAL_TABLET | ORAL | Status: AC

## 2011-07-03 NOTE — Telephone Encounter (Signed)
**Note De-Identified Hailey Hernandez** Called, spoke with pt.  C/o sinus pressure and congestion, PND, blowing light yellow mucus out of nose, and prod cough with a lot of mucus.  Pt states the mucus she was coughing up was bright yellow but is now clear.  This started yesterday.  She denies increased SOB, wheezing, chest tightness, f/c/s.  She is taking the albuterol nebs, ibuprofen, afrin nasal spray, and allegra.  Allergies verified.  [place].  Dr. Maple Hudson, pls advise.  Thanks!

## 2011-07-03 NOTE — Telephone Encounter (Signed)
**Note De-Identified Bickley Obfuscation** Per CY-okay to give Zpak #1 take as directed no refills and use Mucinex-D OTC.

## 2011-07-03 NOTE — Telephone Encounter (Signed)
**Note De-Identified Reimers Obfuscation** Pt aware of cdy recs and rx sent to pharmacy. Nothing further was needed

## 2011-07-03 NOTE — Telephone Encounter (Signed)
**Note De-Identified Krupp Obfuscation** NOTE: [facility]

## 2011-07-06 LAB — POCT I-STAT, CHEM 8
BUN: 13 mg/dL (ref 6–23)
Calcium, Ion: 1.04 mmol/L — ABNORMAL LOW (ref 1.12–1.32)
Glucose, Bld: 120 mg/dL — ABNORMAL HIGH (ref 70–99)
TCO2: 34 mmol/L (ref 0–100)

## 2011-07-06 LAB — DIFFERENTIAL
Basophils Absolute: 0.1 10*3/uL (ref 0.0–0.1)
Basophils Relative: 1 % (ref 0–1)
Eosinophils Absolute: 0.5 10*3/uL (ref 0.0–0.7)
Eosinophils Relative: 4 % (ref 0–5)
Lymphocytes Relative: 16 % (ref 12–46)
Lymphs Abs: 1.6 10*3/uL (ref 0.7–4.0)
Monocytes Absolute: 0.6 10*3/uL (ref 0.1–1.0)
Monocytes Relative: 6 % (ref 3–12)
Neutro Abs: 7.6 10*3/uL (ref 1.7–7.7)
Neutrophils Relative %: 73 % (ref 43–77)

## 2011-07-06 LAB — CBC
HCT: 50 % — ABNORMAL HIGH (ref 36.0–46.0)
Hemoglobin: 16 g/dL — ABNORMAL HIGH (ref 12.0–15.0)
MCHC: 32.1 g/dL (ref 30.0–36.0)
MCV: 96.6 fL (ref 78.0–100.0)
Platelets: 235 10*3/uL (ref 150–400)
RBC: 5.18 MIL/uL — ABNORMAL HIGH (ref 3.87–5.11)
RDW: 15.6 % — ABNORMAL HIGH (ref 11.5–15.5)
WBC: 10.4 10*3/uL (ref 4.0–10.5)

## 2011-07-09 ENCOUNTER — Telehealth: Payer: Self-pay | Admitting: Internal Medicine

## 2011-07-09 MED ORDER — CEFDINIR 300 MG PO CAPS: ORAL_CAPSULE | ORAL | Status: DC

## 2011-07-09 NOTE — Telephone Encounter (Signed)
**Note De-identified Whitwell Obfuscation** Per CY-okay to give patient Cefdinir 300 mg #14 take 2 daily no refills.  

## 2011-07-09 NOTE — Telephone Encounter (Signed)
**Note De-Identified Lefferts Obfuscation** Pt aware of cy recs and rx was sent

## 2011-07-09 NOTE — Telephone Encounter (Signed)
**Note De-Identified Perry Obfuscation** I spoke with pt and she states she had finished the zpak given to her on 10/2 and is still no better. Pt c/o cough w/ light yellow phlem, sinus pressure, wheezing, PND, blowing yellow phlem out of her nose. Pt states she thinks she needs something stronger than the zpak called in. Pt states she is still doing the mucinex as directed. Please advise Dr. Maple Hudson, Thanks  Allergies  Allergen Reactions  . Milnacipran     REACTION: dizzy     Carver Fila, CMA

## 2011-07-12 ENCOUNTER — Encounter: Payer: Self-pay | Admitting: Adult Health

## 2011-07-12 ENCOUNTER — Ambulatory Visit (INDEPENDENT_AMBULATORY_CARE_PROVIDER_SITE_OTHER): Payer: Medicare Other | Admitting: Adult Health

## 2011-07-12 ENCOUNTER — Telehealth: Payer: Self-pay | Admitting: Internal Medicine

## 2011-07-12 ENCOUNTER — Ambulatory Visit (INDEPENDENT_AMBULATORY_CARE_PROVIDER_SITE_OTHER)
Admission: RE | Admit: 2011-07-12 | Discharge: 2011-07-12 | Disposition: A | Discharge status: Home / Self Care | Payer: Medicare Other | Source: Ambulatory Visit | Attending: Adult Health | Admitting: Adult Health

## 2011-07-12 DIAGNOSIS — R0989 Other specified symptoms and signs involving the circulatory and respiratory systems: Secondary | ICD-10-CM

## 2011-07-12 DIAGNOSIS — R0609 Other forms of dyspnea: Secondary | ICD-10-CM

## 2011-07-12 DIAGNOSIS — J42 Unspecified chronic bronchitis: Secondary | ICD-10-CM

## 2011-07-12 MED ORDER — PREDNISONE 10 MG PO TABS: ORAL_TABLET | ORAL | Status: AC

## 2011-07-12 MED ORDER — HYDROCODONE-ACETAMINOPHEN 5-500 MG PO TABS: 1.0000 | ORAL_TABLET | ORAL | Status: DC | PRN

## 2011-07-12 NOTE — Progress Notes (Signed)
**Note De-Identified [place]aven Obfuscation** Subjective:    Patient ID: Hailey Hernandez, female    DOB: 21-Jun-1956, 55 y.o.   MRN: [id number]  HPI 66 female with known hx of chronic bronchitis , OSA/OHS , active smoker   02/05/11- 55 yoFwith chronic bronchitis, tobacco use, OSA, obesity hypoventilation syndrome. Last here December 15, 2010. Since then has continued Symbicort. Comes with sister today. Sleeping 2 nights ago- woke smothered. Had to pull off CPAP. She gradually cleared over several minutes. Not clear if she refluxed, had mucus plug or ??. Today is typical with no residual. Chest congestion varies day to day. Says cough better, not coughing up anything purulent or bloody. Still averaging 1.5 PPD, down from 3 PPD.  Compliant with CPAP- needs new mask.   07/12/2011 Acute OV  Presents for an acute office visit. Complains pain in right side x 3 days--more painful with cough and movement.  cough with green sputum--. She has been sick for 2 weeks, cough, congestion , drainage. Was exposed to URI symptoms at hospital . Started with lots of drippy nose and drainge.  No fever. Initially phoned in a zpack with no improvement. Then started on Omnicef 4 days ago.  Does have some wheezing . Now pain with inspiration esp along right lateral ribs. Tender to touch and with movement. No rash or fever.    Review of Systems  See HPI Constitutional:   No weight loss, night sweats,  Fevers, chills, fatigue, lassitude. HEENT:   No headaches,  Difficulty swallowing,  Tooth/dental problems,  Sore throat,                No sneezing, itching, ear ache, nasal congestion, post nasal drip,   CV:  No chest pain,  Orthopnea, PND, swelling in lower extremities, anasarca, dizziness, palpitations  GI  No heartburn, indigestion, abdominal pain, nausea, vomiting, diarrhea, change in bowel habits, loss of appetite  Resp:  No coughing up of blood.    Skin: no rash or lesions.  GU: no dysuria, change in color of urine, no urgency or frequency.  No flank  pain.  MS:  No joint pain or swelling.  No decreased range of motion.  No back pain.  Psych:  No change in mood or affect. No depression or anxiety.  No memory loss.     Objective:   Physical Exam  General- Alert, Oriented, Affect-appropriate, Distress- none acute    obese   O2  2L contiuous  Skin- Psoriasis  Lymphadenopathy- none  Head- atraumatic  Eyes- Gross vision intact, PERRLA, conjunctivae clear secretions  Ears-  Normal- hearing, canals, Tm   Nose- Clear,  No- Septal dev, mucus, polyps, erosion, perforation   Throat- Mallampati III-IV , mucosa clear , drainage- none, tonsils- atrophic  Neck- flexible , trachea midline, no stridor , thyroid nl, carotid no bruit  Chest - symmetrical excursion , unlabored     Heart/CV- RRR , no murmur , no gallop  , no rub, nl s1 s2                     - JVD- none , edema- none, stasis changes- none, varices- none     Lung- coarse BS w/ faint wheeze, right lateral tenderness, no rash noted.  dullness-none, rub- none     Chest wall-   Abd- tender-no, distended-no, bowel sounds-present, HSM- no  Br/ Gen/ Rectal- Not done, not indicated  Extrem- cyanosis- none, clubbing, none, atrophy- none, strength- nl  Neuro- grossly intact to observation **Note De-Identified Minetti Obfuscation** Derm: psoriatic patches.         Assessment & Plan:

## 2011-07-12 NOTE — Assessment & Plan Note (Signed)
**Note De-Identified Barb Obfuscation** Slow to resolve flare with associated pl [facility] .  Mucinex DM Twice daily  As needed  Cough/congestion  Prednisone taper over next week.  Warm heat to ribs As needed   Vicodin 1-2 every 4-6 hr As needed pain - hold tramadol while on this.  Please contact office for sooner follow up if symptoms do not improve or worsen or seek emergency care  follow up Dr. Maple Hudson  As planned in 1 month and As needed

## 2011-07-12 NOTE — Patient Instructions (Signed)
**Note De-Identified Fearnow Obfuscation** [employer] .  Mucinex DM Twice daily  As needed  Cough/congestion  Prednisone taper over next week.  Warm heat to ribs As needed   Vicodin 1-2 every 4-6 hr As needed pain - hold tramadol while on this.  Please contact office for sooner follow up if symptoms do not improve or worsen or seek emergency care  follow up Dr. Maple Hudson  As planned in 1 month and As needed

## 2011-07-12 NOTE — Telephone Encounter (Signed)
**Note De-Identified Pacitti Obfuscation** Spoke with pt. She is c/o CP upon inspiration and increased SOB x 2 days. OV with TP at 9:45 am. Okay per CDY.

## 2011-07-23 ENCOUNTER — Telehealth: Payer: Self-pay | Admitting: Internal Medicine

## 2011-07-23 MED ORDER — HYDROCODONE-ACETAMINOPHEN 5-500 MG PO TABS: 1.0000 | ORAL_TABLET | ORAL | Status: DC | PRN

## 2011-07-23 NOTE — Telephone Encounter (Signed)
**Note De-Identified Yung Obfuscation** Pharmacy faxed request for refill on Vicodin 5/500. Last filled on 07/12/2011 for # 20 w/ no additional refills. Saw TP on 07/12/2011 and CDY on 02/05/2011. Pt does have a pending appt with CDY on 08/10/2011. Pls advise on sending refill.

## 2011-07-23 NOTE — Telephone Encounter (Signed)
**Note De-Identified Grahn Obfuscation** Per CY-okay to refill once.

## 2011-07-23 NOTE — Telephone Encounter (Signed)
**Note De-identified Rogness Obfuscation** Refill called to [facility] 

## 2011-08-10 ENCOUNTER — Encounter: Payer: Self-pay | Admitting: Internal Medicine

## 2011-08-10 ENCOUNTER — Ambulatory Visit (INDEPENDENT_AMBULATORY_CARE_PROVIDER_SITE_OTHER): Payer: Medicare Other | Admitting: Internal Medicine

## 2011-08-10 DIAGNOSIS — Z23 Encounter for immunization: Secondary | ICD-10-CM

## 2011-08-10 DIAGNOSIS — J42 Unspecified chronic bronchitis: Secondary | ICD-10-CM

## 2011-08-10 DIAGNOSIS — F17200 Nicotine dependence, unspecified, uncomplicated: Secondary | ICD-10-CM

## 2011-08-10 DIAGNOSIS — J019 Acute sinusitis, unspecified: Secondary | ICD-10-CM

## 2011-08-10 MED ORDER — METHYLPREDNISOLONE ACETATE 80 MG/ML IJ SUSP
80.0000 mg | Freq: Once | INTRAMUSCULAR | Status: AC
  Administered 2011-08-10: 80 mg via INTRAMUSCULAR

## 2011-08-10 MED ORDER — PHENYLEPHRINE HCL 1 % NA SOLN
3.0000 [drp] | Freq: Once | NASAL | Status: AC
  Administered 2011-08-10: 3 [drp] via NASAL

## 2011-08-10 MED ORDER — DOXYCYCLINE HYCLATE 100 MG PO TABS: 100.0000 mg | ORAL_TABLET | Freq: Two times a day (BID) | ORAL | Status: AC

## 2011-08-10 NOTE — Patient Instructions (Addendum)
**Note De-Identified Tonelli Obfuscation** Neb neo nasal   Depo 80  Flu vax  Script sent for antibiotic

## 2011-08-10 NOTE — Progress Notes (Signed)
**Note De-Identified Langenberg Obfuscation** Subjective:    Patient ID: Hailey Hernandez, female    DOB: 1956/06/13, 55 y.o.   MRN: [id number]  HPI 87 female with known hx of chronic bronchitis , OSA/OHS , active smoker   02/05/11- 55 yoFwith chronic bronchitis, tobacco use, OSA, obesity hypoventilation syndrome. Last here December 15, 2010. Since then has continued Symbicort. Comes with sister today. Sleeping 2 nights ago- woke smothered. Had to pull off CPAP. She gradually cleared over several minutes. Not clear if she refluxed, had mucus plug or ??. Today is typical with no residual. Chest congestion varies day to day. Says cough better, not coughing up anything purulent or bloody. Still averaging 1.5 PPD, down from 3 PPD.  Compliant with CPAP- needs new mask.   07/12/2011 Acute OV  Presents for an acute office visit. Complains pain in right side x 3 days--more painful with cough and movement.  cough with green sputum--. She has been sick for 2 weeks, cough, congestion , drainage. Was exposed to URI symptoms at hospital . Started with lots of drippy nose and drainge.  No fever. Initially phoned in a zpack with no improvement. Then started on Omnicef 4 days ago.  Does have some wheezing . Now pain with inspiration esp along right lateral ribs. Tender to touch and with movement. No rash or fever.   08/10/11- 55 yoFwith chronic bronchitis, tobacco use, OSA, obesity hypoventilation syndrome. Several family members here.Marland Kitchen PCP Dr Lysbeth Galas She describes persistent ear ache on the right, nasal discharge from the left with little bit of bloody mucus. Distal coughing productively with light yellow sputum, no blood. Questions low-grade fever mainly because she sometimes feels chilly. Smoking is down from 3 packs a day to a little under one pack per day. I encouraged her to keep working at. She gives history of a cholesteatoma and told never to put water in her right ear.  Review of Systems See HPI   Constitutional:   No-   weight loss, night sweats,  fevers, chills, fatigue, lassitude. HEENT:   No-  headaches, difficulty swallowing, tooth/dental problems, sore throat,       No-  sneezing, itching, +ear ache, nasal congestion, post nasal drip,  CV:  No-   chest pain, orthopnea, PND, swelling in lower extremities, anasarca,                                  dizziness, palpitations Resp: No-   shortness of breath with exertion or at rest.              No-   productive cough,  No non-productive cough,  No- coughing up of blood.              No-   change in color of mucus.  No- wheezing.   Skin: No-   rash or lesions. GI:  No-   heartburn, indigestion, abdominal pain, nausea, vomiting, diarrhea,                 change in bowel habits, loss of appetite GU: No-   dysuria, change in color of urine, no urgency or frequency.  No- flank pain. MS:  No-   joint pain or swelling.  No- decreased range of motion.  No- back pain. Neuro-     nothing unusual Psych:  No- change in mood or affect. No depression or anxiety.  No memory loss.   Objective:   Physical Exam **Note De-Identified Smigiel Obfuscation** General- Alert, Oriented, Affect-appropriate, Distress- none acute  Tachycardia 120 regular, sat 91% on 3L. Skin- extensive psoriasis,  Lymphadenopathy- none Head- atraumatic            Eyes- Gross vision intact, PERRLA, conjunctivae clear secretions            Ears- Hearing, canals- cerumen/ debris R            Nose- Clear, no-Septal dev, mucus, polyps, erosion, perforation             Throat- Mallampati II , mucosa clear , drainage- none, tonsils- atrophic. Mouth breathing Neck- flexible , trachea midline, no stridor , thyroid nl, carotid no bruit Chest - symmetrical excursion , unlabored           Heart/CV- RRR , no murmur , no gallop  , no rub, nl s1 s2                           - JVD- none , edema- none, stasis changes+ bilateral, varices- none           Lung- clear to P&A, wheeze- none, cough- none , dullness-none, rub- none           Chest wall-  Abd- tender-no, distended-no, bowel  sounds-present, HSM- no Br/ Gen/ Rectal- Not done, not indicated Extrem- cyanosis- none, clubbing, none, atrophy- none, strength- nl Neuro- grossly intact to observation

## 2011-08-14 DIAGNOSIS — J0101 Acute recurrent maxillary sinusitis: Secondary | ICD-10-CM | POA: Insufficient documentation

## 2011-08-14 NOTE — Assessment & Plan Note (Signed)
**Note De-Identified Hendry Obfuscation** We again discussed her smoking and support measures available for help and quit. She is making slow progress, in a smoking family.

## 2011-08-14 NOTE — Assessment & Plan Note (Signed)
**Note De-Identified Heupel Obfuscation** Plan Depo-Medrol, nasal decongestant nebulization, doxycycline Flu vaccine

## 2011-10-19 ENCOUNTER — Other Ambulatory Visit: Payer: Self-pay | Admitting: Orthopedic Surgery

## 2011-10-24 ENCOUNTER — Encounter (HOSPITAL_BASED_OUTPATIENT_CLINIC_OR_DEPARTMENT_OTHER): Payer: Self-pay | Admitting: *Deleted

## 2011-10-29 ENCOUNTER — Encounter (HOSPITAL_BASED_OUTPATIENT_CLINIC_OR_DEPARTMENT_OTHER)
Admission: RE | Admit: 2011-10-29 | Discharge: 2011-10-29 | Disposition: A | Discharge status: Home / Self Care | Payer: Medicare Other | Source: Ambulatory Visit | Attending: Orthopedic Surgery | Admitting: Orthopedic Surgery

## 2011-10-29 LAB — COMPREHENSIVE METABOLIC PANEL
ALT: 36 U/L — ABNORMAL HIGH (ref 0–35)
CO2: 27 mEq/L (ref 19–32)
Calcium: 9.5 mg/dL (ref 8.4–10.5)
Creatinine, Ser: 0.56 mg/dL (ref 0.50–1.10)
GFR calc Af Amer: >90 mL/min (ref >90)
GFR calc non Af Amer: >90 mL/min (ref >90)
Glucose, Bld: 157 mg/dL — ABNORMAL HIGH (ref 70–99)
Sodium: 136 mEq/L (ref 135–145)
Total Protein: 7.7 g/dL (ref 6.0–8.3)

## 2011-10-29 NOTE — Progress Notes (Signed)
**Note De-Identified Arentz Obfuscation** Spoke with dr Randa Evens, cmet results given ,  States md  May want to repeat in am.  Chart flagged for md in am

## 2011-10-30 ENCOUNTER — Encounter (HOSPITAL_BASED_OUTPATIENT_CLINIC_OR_DEPARTMENT_OTHER): Payer: Self-pay | Admitting: Orthopedic Surgery

## 2011-10-30 ENCOUNTER — Ambulatory Visit (HOSPITAL_BASED_OUTPATIENT_CLINIC_OR_DEPARTMENT_OTHER)
Admission: RE | Admit: 2011-10-30 | Discharge: 2011-10-30 | Disposition: A | Discharge status: Home / Self Care | Payer: Medicare Other | Source: Ambulatory Visit | Attending: Orthopedic Surgery | Admitting: Orthopedic Surgery

## 2011-10-30 ENCOUNTER — Other Ambulatory Visit: Payer: Self-pay | Admitting: Orthopedic Surgery

## 2011-10-30 ENCOUNTER — Encounter (HOSPITAL_BASED_OUTPATIENT_CLINIC_OR_DEPARTMENT_OTHER): Payer: Self-pay | Admitting: Anesthesiology

## 2011-10-30 ENCOUNTER — Encounter (HOSPITAL_BASED_OUTPATIENT_CLINIC_OR_DEPARTMENT_OTHER): Payer: Self-pay | Admitting: *Deleted

## 2011-10-30 ENCOUNTER — Encounter (HOSPITAL_BASED_OUTPATIENT_CLINIC_OR_DEPARTMENT_OTHER)
Admission: RE | Disposition: A | Discharge status: Home / Self Care | Payer: Self-pay | Source: Ambulatory Visit | Attending: Orthopedic Surgery

## 2011-10-30 ENCOUNTER — Ambulatory Visit (HOSPITAL_BASED_OUTPATIENT_CLINIC_OR_DEPARTMENT_OTHER): Payer: Medicare Other | Admitting: Anesthesiology

## 2011-10-30 DIAGNOSIS — F17200 Nicotine dependence, unspecified, uncomplicated: Secondary | ICD-10-CM | POA: Insufficient documentation

## 2011-10-30 DIAGNOSIS — G5600 Carpal tunnel syndrome, unspecified upper limb: Secondary | ICD-10-CM | POA: Insufficient documentation

## 2011-10-30 DIAGNOSIS — K219 Gastro-esophageal reflux disease without esophagitis: Secondary | ICD-10-CM | POA: Insufficient documentation

## 2011-10-30 DIAGNOSIS — M65839 Other synovitis and tenosynovitis, unspecified forearm: Secondary | ICD-10-CM | POA: Insufficient documentation

## 2011-10-30 DIAGNOSIS — K08109 Complete loss of teeth, unspecified cause, unspecified class: Secondary | ICD-10-CM | POA: Insufficient documentation

## 2011-10-30 DIAGNOSIS — J449 Chronic obstructive pulmonary disease, unspecified: Secondary | ICD-10-CM | POA: Insufficient documentation

## 2011-10-30 DIAGNOSIS — G4730 Sleep apnea, unspecified: Secondary | ICD-10-CM | POA: Insufficient documentation

## 2011-10-30 DIAGNOSIS — Z85850 Personal history of malignant neoplasm of thyroid: Secondary | ICD-10-CM | POA: Insufficient documentation

## 2011-10-30 DIAGNOSIS — Z01812 Encounter for preprocedural laboratory examination: Secondary | ICD-10-CM | POA: Insufficient documentation

## 2011-10-30 DIAGNOSIS — M65849 Other synovitis and tenosynovitis, unspecified hand: Secondary | ICD-10-CM | POA: Insufficient documentation

## 2011-10-30 DIAGNOSIS — I10 Essential (primary) hypertension: Secondary | ICD-10-CM | POA: Insufficient documentation

## 2011-10-30 DIAGNOSIS — R0602 Shortness of breath: Secondary | ICD-10-CM | POA: Insufficient documentation

## 2011-10-30 DIAGNOSIS — IMO0001 Reserved for inherently not codable concepts without codable children: Secondary | ICD-10-CM | POA: Insufficient documentation

## 2011-10-30 DIAGNOSIS — J4489 Other specified chronic obstructive pulmonary disease: Secondary | ICD-10-CM | POA: Insufficient documentation

## 2011-10-30 HISTORY — PX: CARPAL TUNNEL RELEASE: SHX101

## 2011-10-30 HISTORY — DX: Malignant (primary) neoplasm, unspecified: C80.1

## 2011-10-30 HISTORY — DX: Gastro-esophageal reflux disease without esophagitis: K21.9

## 2011-10-30 HISTORY — DX: Fibromyalgia: M79.7

## 2011-10-30 LAB — POCT I-STAT, CHEM 8
BUN: 15 mg/dL (ref 6–23)
Calcium, Ion: 1.14 mmol/L (ref 1.12–1.32)
Chloride: 102 mEq/L (ref 96–112)
HCT: 51 % — ABNORMAL HIGH (ref 36.0–46.0)
Potassium: 4.4 mEq/L (ref 3.5–5.1)
Sodium: 141 mEq/L (ref 135–145)

## 2011-10-30 SURGERY — CARPAL TUNNEL RELEASE
Anesthesia: Choice | Site: Wrist | Laterality: Right

## 2011-10-30 MED ORDER — FENTANYL CITRATE 0.05 MG/ML IJ SOLN
INTRAMUSCULAR | Status: DC | PRN
  Administered 2011-10-30 (×2): 12.5 ug via INTRAVENOUS
  Administered 2011-10-30: 25 ug via INTRAVENOUS
  Administered 2011-10-30 (×2): 12.5 ug via INTRAVENOUS
  Administered 2011-10-30: 25 ug via INTRAVENOUS

## 2011-10-30 MED ORDER — CHLORHEXIDINE GLUCONATE 4 % EX LIQD: 60.0000 mL | Freq: Once | CUTANEOUS | Status: DC

## 2011-10-30 MED ORDER — BUPIVACAINE HCL (PF) 0.25 % IJ SOLN
INTRAMUSCULAR | Status: DC | PRN
  Administered 2011-10-30: 6 mL

## 2011-10-30 MED ORDER — LACTATED RINGERS IV SOLN
INTRAVENOUS | Status: DC
  Administered 2011-10-30: 09:00:00 via INTRAVENOUS

## 2011-10-30 MED ORDER — ONDANSETRON HCL 4 MG/2ML IJ SOLN
INTRAMUSCULAR | Status: DC | PRN
  Administered 2011-10-30: 4 mg via INTRAVENOUS

## 2011-10-30 MED ORDER — PROPOFOL 10 MG/ML IV EMUL
INTRAVENOUS | Status: DC | PRN
  Administered 2011-10-30: 50 ug/kg/min via INTRAVENOUS

## 2011-10-30 MED ORDER — MIDAZOLAM HCL 5 MG/5ML IJ SOLN
INTRAMUSCULAR | Status: DC | PRN
  Administered 2011-10-30: 1 mg via INTRAVENOUS

## 2011-10-30 MED ORDER — FENTANYL CITRATE 0.05 MG/ML IJ SOLN
25.0000 ug | INTRAMUSCULAR | Status: DC | PRN
  Administered 2011-10-30: 50 ug via INTRAVENOUS

## 2011-10-30 MED ORDER — PROMETHAZINE HCL 25 MG/ML IJ SOLN: 6.2500 mg | INTRAMUSCULAR | Status: DC | PRN

## 2011-10-30 MED ORDER — HYDROCODONE-ACETAMINOPHEN 5-500 MG PO TABS: 1.0000 | ORAL_TABLET | ORAL | Status: AC | PRN

## 2011-10-30 MED ORDER — CEFAZOLIN SODIUM-DEXTROSE 2-3 GM-% IV SOLR
2.0000 g | INTRAVENOUS | Status: AC
  Administered 2011-10-30: 2 g via INTRAVENOUS

## 2011-10-30 SURGICAL SUPPLY — 35 items
BANDAGE GAUZE ELAST BULKY 4 IN (GAUZE/BANDAGES/DRESSINGS) ×2 IMPLANT
BLADE SURG 15 STRL LF DISP TIS (BLADE) ×1 IMPLANT
BLADE SURG 15 STRL SS (BLADE) ×2
BNDG CMPR 9X4 STRL LF SNTH (GAUZE/BANDAGES/DRESSINGS) ×1
BNDG COHESIVE 3X5 TAN STRL LF (GAUZE/BANDAGES/DRESSINGS) ×2 IMPLANT
BNDG ESMARK 4X9 LF (GAUZE/BANDAGES/DRESSINGS) ×1 IMPLANT
CHLORAPREP W/TINT 26ML (MISCELLANEOUS) ×2 IMPLANT
CLOTH BEACON ORANGE TIMEOUT ST (SAFETY) ×2 IMPLANT
CORDS BIPOLAR (ELECTRODE) ×2 IMPLANT
COVER MAYO STAND STRL (DRAPES) ×2 IMPLANT
COVER TABLE BACK 60X90 (DRAPES) ×2 IMPLANT
CUFF TOURNIQUET SINGLE 18IN (TOURNIQUET CUFF) ×1 IMPLANT
DRAPE EXTREMITY T 121X128X90 (DRAPE) ×2 IMPLANT
DRAPE SURG 17X23 STRL (DRAPES) ×2 IMPLANT
DRSG KUZMA FLUFF (GAUZE/BANDAGES/DRESSINGS) ×2 IMPLANT
GAUZE XEROFORM 1X8 LF (GAUZE/BANDAGES/DRESSINGS) ×2 IMPLANT
GLOVE BIO SURGEON STRL SZ 6.5 (GLOVE) ×2 IMPLANT
GLOVE SURG ORTHO 8.0 STRL STRW (GLOVE) ×2 IMPLANT
GOWN BRE IMP PREV XXLGXLNG (GOWN DISPOSABLE) ×3 IMPLANT
GOWN PREVENTION PLUS XLARGE (GOWN DISPOSABLE) ×2 IMPLANT
NEEDLE 27GAX1X1/2 (NEEDLE) IMPLANT
PACK BASIN DAY SURGERY FS (CUSTOM PROCEDURE TRAY) ×2 IMPLANT
PAD CAST 3X4 CTTN HI CHSV (CAST SUPPLIES) ×1 IMPLANT
PADDING CAST ABS 4INX4YD NS (CAST SUPPLIES) ×1
PADDING CAST ABS COTTON 4X4 ST (CAST SUPPLIES) ×1 IMPLANT
PADDING CAST COTTON 3X4 STRL (CAST SUPPLIES) ×2
SPONGE GAUZE 4X4 12PLY (GAUZE/BANDAGES/DRESSINGS) ×2 IMPLANT
STOCKINETTE 4X48 STRL (DRAPES) ×2 IMPLANT
SUT VICRYL 4-0 PS2 18IN ABS (SUTURE) ×1 IMPLANT
SUT VICRYL RAPIDE 4/0 PS 2 (SUTURE) ×2 IMPLANT
SYR BULB 3OZ (MISCELLANEOUS) ×2 IMPLANT
SYR CONTROL 10ML LL (SYRINGE) IMPLANT
TOWEL OR 17X24 6PK STRL BLUE (TOWEL DISPOSABLE) ×2 IMPLANT
UNDERPAD 30X30 INCONTINENT (UNDERPADS AND DIAPERS) ×2 IMPLANT
WATER STERILE IRR 1000ML POUR (IV SOLUTION) ×1 IMPLANT

## 2011-10-30 NOTE — Anesthesia Procedure Notes (Signed)
**Note De-Identified Eltzroth Obfuscation** Procedure Name: MAC Date/Time: 10/30/2011 9:00 AM Performed by: Gladys Damme Pre-anesthesia Checklist: Patient identified, Emergency Drugs available, Suction available and Patient being monitored Patient Re-evaluated:Patient Re-evaluated prior to inductionOxygen Delivery Method: Simple face mask Intubation Type: IV induction Placement Confirmation: positive ETCO2

## 2011-10-30 NOTE — Anesthesia Preprocedure Evaluation (Signed)
**Note De-Identified Dotzler Obfuscation** Anesthesia Evaluation  Patient identified by MRN, date of birth, ID band Patient awake    Reviewed: Allergy & Precautions, H&P , NPO status , Patient's Chart, lab work & pertinent test results  History of Anesthesia Complications Negative for: history of anesthetic complications  Airway Mallampati: I TM Distance: >3 FB Neck ROM: Full    Dental  (+) Lower Dentures and Upper Dentures   Pulmonary shortness of breath, at rest, lying and Long-Term Oxygen Therapy, sleep apnea, Continuous Positive Airway Pressure Ventilation and Oxygen sleep apnea , COPD COPD inhaler and oxygen dependent, Current Smoker,  Used inhalers today clear to auscultation  Pulmonary exam normal       Cardiovascular hypertension, Pt. on medications Regular Normal 3/12 ECHO- low normal LVF, EF 45-50%   Neuro/Psych Negative Neurological ROS     GI/Hepatic Neg liver ROS, GERD-  Medicated and Controlled,  Endo/Other  Morbid obesity  Renal/GU negative Renal ROS     Musculoskeletal  (+) Fibromyalgia -  Abdominal (+) obese,   Peds  Hematology   Anesthesia Other Findings   Reproductive/Obstetrics                           Anesthesia Physical Anesthesia Plan  ASA: III  Anesthesia Plan: MAC and Bier Block   Post-op Pain Management:    Induction:   Airway Management Planned: Simple Face Mask  Additional Equipment:   Intra-op Plan:   Post-operative Plan:   Informed Consent: I have reviewed the patients History and Physical, chart, labs and discussed the procedure including the risks, benefits and alternatives for the proposed anesthesia with the patient or authorized representative who has indicated his/her understanding and acceptance.     Plan Discussed with: CRNA and Surgeon  Anesthesia Plan Comments: (Plan routine monitors, IV regional Lidocaine)        Anesthesia Quick Evaluation

## 2011-10-30 NOTE — Op Note (Signed)
**Note De-Identified Ogando Obfuscation** Dictated number:  [id number]

## 2011-10-30 NOTE — Anesthesia Postprocedure Evaluation (Signed)
**Note De-identified Racanelli Obfuscation**  **Note De-Identified Esguerra Obfuscation** Anesthesia Post-op Note  Patient: Hailey Hernandez  Procedure(s) Performed:  CARPAL TUNNEL RELEASE - and mass excision  Patient Location: PACU  Anesthesia Type: MAC and Bier block  Level of Consciousness: awake, alert  and oriented  Airway and Oxygen Therapy: Patient Spontanous Breathing and Patient connected to nasal cannula oxygen  Post-op Pain: mild  Post-op Assessment: Post-op Vital signs reviewed, Patient's Cardiovascular Status Stable, Respiratory Function Stable, Patent Airway, No signs of Nausea or vomiting and Pain level controlled  Post-op Vital Signs: Reviewed and stable on her usual oxygen  Complications: No apparent anesthesia complications

## 2011-10-30 NOTE — H&P (Signed)
**Note De-Identified Farnell Obfuscation** Pt is a 56 yo female with numbness and tingling of her hands not responsive to conservative treatment.  She has had her nerve conductions done revealing carpal tunnel syndrome on her right side with a motor delay of 4.8, sensory delay of 2.8.  Her MRI reveals a mass in the carpal canal. This appears to be coming out of volar carpus.  It is present on multiple views.  This is not cystic in nature. Hailey Hernandez is an 56 y.o. female.   Chief Complaint: Numbness rt hand  HPI: see above  Past Medical History  Diagnosis Date  . Tachyarrhythmia   . Tobacco abuse   . Chronic bronchitis   . History of thyroid cancer   . Rhinitis   . OSA (obstructive sleep apnea)   . Polymyalgia   . Diverticulitis   . DJD (degenerative joint disease)     spine  . Anxiety disorder   . Arthritis   . COPD (chronic obstructive pulmonary disease)   . Hyperlipidemia   . HTN (hypertension)   . PONV (postoperative nausea and vomiting)   . Shortness of breath   . Cancer     thyroid  . GERD (gastroesophageal reflux disease)   . Anxiety   . Fibromyalgia   . Arthritis     bil legs    Past Surgical History  Procedure Date  . Total abdominal hysterectomy   . Cholecystectomy   . Partial thymectomy   . Appendectomy   . Carpal tunnel release   . Left shoulder spurs   . Orif right lower leg   . Cholesteatoma excision     right ear    Family History  Problem Relation Age of Onset  . Emphysema Sister   . Emphysema Sister   . Alpha-1 antitrypsin deficiency Sister   . Alpha-1 antitrypsin deficiency Sister   . Allergies Sister   . Asthma Sister   . Heart disease Mother   . Heart disease Father   . Cancer Mother   . Cancer Sister   . Cancer Brother   . Tuberculosis Other     grandmother   Social History:  reports that she has been smoking Cigarettes.  She has a 45 pack-year smoking history. She has never used smokeless tobacco. She reports that she does not drink alcohol or use illicit  drugs.  Allergies:  Allergies  Allergen Reactions  . Milnacipran     REACTION: dizzy    Medications Prior to Admission  Medication Dose Route Frequency Provider Last Rate Last Dose  . lactated ringers infusion   Intravenous Continuous Constance Goltz, MD 20 mL/hr at 10/30/11 [phone]     Medications Prior to Admission  Medication Sig Dispense Refill  . albuterol (PROVENTIL) (2.5 MG/3ML) 0.083% nebulizer solution Take 3 mLs (2.5 mg total) by nebulization every 4 (four) hours as needed for wheezing or shortness of breath.  120 vial  2  . fexofenadine (ALLEGRA) 180 MG tablet Take 180 mg by mouth daily.        . fluticasone (VERAMYST) 27.5 MCG/SPRAY nasal spray Place 2 sprays into the nose daily as needed.       . folic acid (FOLVITE) 1 MG tablet Take 1 mg by mouth daily.       Marland Kitchen ibuprofen (ADVIL,MOTRIN) 200 MG tablet Take 200 mg by mouth every 6 (six) hours as needed.      Marland Kitchen lisinopril (PRINIVIL,ZESTRIL) 10 MG tablet Take 10 mg by mouth daily.       Marland Kitchen **Note De-Identified Otterson Obfuscation** LORazepam (ATIVAN) 0.5 MG tablet Take 0.5 mg by mouth daily as needed.       . methotrexate (RHEUMATREX) 2.5 MG tablet 2 (two) times a week. Caution:Chemotherapy. Protect from light.      . Nebulizers (COMPRESSOR/NEBULIZER) MISC Use up to 4 times daily when needed  1 each  0  . omeprazole (PRILOSEC) 20 MG capsule Take 20 mg by mouth daily.       . traMADol (ULTRAM) 50 MG tablet Take 50 mg by mouth 2 (two) times daily.        . budesonide-formoterol (SYMBICORT) 160-4.5 MCG/ACT inhaler Inhale 2 puffs into the lungs daily.       Marland Kitchen doxepin (SINEQUAN) 10 MG capsule 1-2 at bedtime         Results for orders placed during the hospital encounter of 10/30/11 (from the past 48 hour(s))  COMPREHENSIVE METABOLIC PANEL     Status: Abnormal   Collection Time   10/29/11 12:15 PM      Component Value Range Comment   Sodium 136  135 - 145 (mEq/L)    Potassium 5.9 (*) 3.5 - 5.1 (mEq/L) HEMOLYZED SPECIMEN, RESULTS MAY BE AFFECTED   Chloride 97  96 -  112 (mEq/L)    CO2 27  19 - 32 (mEq/L)    Glucose, Bld 157 (*) 70 - 99 (mg/dL)    BUN 12  6 - 23 (mg/dL)    Creatinine, Ser 1.61  0.50 - 1.10 (mg/dL)    Calcium 9.5  8.4 - 10.5 (mg/dL)    Total Protein 7.7  6.0 - 8.3 (g/dL)    Albumin 3.8  3.5 - 5.2 (g/dL)    AST 53 (*) 0 - 37 (U/L) HEMOLYZED SPECIMEN, RESULTS MAY BE AFFECTED   ALT 36 (*) 0 - 35 (U/L) HEMOLYZED SPECIMEN, RESULTS MAY BE AFFECTED   Alkaline Phosphatase 113  39 - 117 (U/L) HEMOLYZED SPECIMEN, RESULTS MAY BE AFFECTED   Total Bilirubin 0.5  0.3 - 1.2 (mg/dL)    GFR calc non Af Amer >90  >90 (mL/min)    GFR calc Af Amer >90  >90 (mL/min)     No results found.   Pertinent items are noted in HPI.  Blood pressure 124/78, pulse 116, temperature 98 F (36.7 C), temperature source Oral, resp. rate 20, height 5\' 5"  (1.651 m), weight 120.203 kg (265 lb), SpO2 90.00%.  General appearance: alert, cooperative and appears stated age Head: Normocephalic, without obvious abnormality Neck: no adenopathy Resp: clear to auscultation bilaterally Cardio: regular rate and rhythm, S1, S2 normal, no murmur, click, rub or gallop GI: soft, non-tender; bowel sounds normal; no masses,  no organomegaly Extremities: extremities normal, atraumatic, no cyanosis or edema Pulses: 2+ and symmetric Skin: Skin color, texture, turgor normal. No rashes or lesions Neurologic: Grossly normal Incision/Wound: na  Assessment/Plan We would recommend a release of her carpal tunnel with exploration and removal of the mass.  This will be scheduled at [facility] as an outpatient.  She is aware of risks and complications including infection, recurrence, injury to arteries, nerves, tendons, incomplete relief of symptoms and dystrophy.    Hailey Hernandez 10/30/2011, 8:39 AM

## 2011-10-30 NOTE — Op Note (Signed)
**Note De-Identified Panik Obfuscation** NAMEModena Hernandez NO.:  [id number]  MEDICAL RECORD NO.:  [id number]  LOCATION:                                 FACILITY:  PHYSICIAN:  Cindee Salt, M.D.            DATE OF BIRTH:  DATE OF PROCEDURE:  10/30/2011 DATE OF DISCHARGE:                              OPERATIVE REPORT   PREOPERATIVE DIAGNOSIS:  Carpal tunnel syndrome with mass, right wrist.  POSTOPERATIVE DIAGNOSIS:  Carpal tunnel syndrome with mass, right wrist.  OPERATION:  Release of median nerve with excision of mass, right wrist.  SURGEON:  Cindee Salt, M.D.  ASSISTANT:  Betha Loa, M.D.  ANESTHESIA:  Forearm-based IV regional with local infiltration.  DATE OF OPERATION:  October 30, 2011.  ANESTHESIOLOGIST:  Dr. Jean Rosenthal.  HISTORY:  The patient is a 56 year old female with a history of carpal tunnel syndrome on her right side.  She has had this released in the past, but has recurred with increased discomfort.  MRI was performed to inspect the possibility of compression to the nerve with positive nerve conductions, for the possibility of re-release and MRI revealed that there were some mass in the carpal canal.  Pre, peri, postoperative diagnoses have been discussed along with risks and complications including infection, recurrence, injury to arteries, nerves, tendons, incomplete relief of symptoms, dystrophy, possibility of this being a more aggressive tumor, and she is desirous having it removed.  In the preoperative area, the patient was seen.  The extremity was marked by both the patient and surgeon.  Antibiotic given.  PROCEDURE:  The patient was brought to the operating room where forearm- based IV regional anesthetic was carried out without difficulty.  She was prepped using ChloraPrep, supine position, right arm free.  A 3- minute dry time was allowed.  Time-out taken, confirming the patient and procedure.  An incision was made in the palm  longitudinally carried to the ulnar  side of the wrist crease and then onto the distal forearm carried down through subcutaneous tissue.  Significant scarring was present about the old incision.  The median nerve was identified proximally.  This was followed distally with sharp dissection releasing the scar of carpal retinaculum.  Distal retinaculum appeared to be intact.  This was also released.  The nerve was found to be significantly scarred to the overlying retinaculum and a neurolysis was performed freeing this from the area.  The flexor tendons were then retracted and a large white mass was immediately encountered in the carpal canal.  This was firmly adherent to the underlying bone.  With the blunt sharp dissection, it was dissected free and sent to Pathology. A chalky material was noted in this.  This was arising from the carpal bones with infiltration into the bones.  This was curetted free.  The wound was copiously irrigated with saline.  The subcutaneous tissue was closed with 4-0 Vicryl and the skin with interrupted 4-0 Vicryl Rapide sutures.  A local infiltration with 0.25% Marcaine without epinephrine was used, approximately 6 mL.  A sterile compressive dressing was applied with the fingers free.  On deflation of the tourniquet, all fingers immediately pinked. **Note De-Identified Helfrich Obfuscation** She was taken to the recovery room for observation in satisfactory condition.  She will be discharged home to return to [facility] in 1 week, on Vicodin.          ______________________________ Cindee Salt, M.D.     GK/MEDQ  D:  10/30/2011  T:  10/30/2011  Job:  [id number]

## 2011-10-30 NOTE — Transfer of Care (Signed)
**Note De-Identified Voong Obfuscation** Immediate Anesthesia Transfer of Care Note  Patient: Hailey Hernandez  Procedure(s) Performed:  CARPAL TUNNEL RELEASE - and mass excision  Patient Location: PACU  Anesthesia Type: MAC and Bier block  Level of Consciousness: awake and alert   Airway & Oxygen Therapy: Patient Spontanous Breathing and Patient connected to face mask oxygen  Post-op Assessment: Report given to PACU RN and Post -op Vital signs reviewed and stable  Post vital signs: Reviewed and stable  Complications: No apparent anesthesia complications

## 2011-10-30 NOTE — Brief Op Note (Signed)
**Note De-Identified Jezewski Obfuscation** 10/30/2011  9:46 AM  PATIENT:  Hailey Hernandez  56 y.o. female  PRE-OPERATIVE DIAGNOSIS:  Carpal tunnel syndrome,mass right wrist  POST-OPERATIVE DIAGNOSIS:  Carpal tunnel syndrome,mass right wrist  PROCEDURE:  Procedure(s): CARPAL TUNNEL RELEASE Excision Mass Rt wrist  SURGEON:  Surgeon(s): Nicki Reaper, MD  PHYSICIAN ASSISTANT:   ASSISTANTS: none   ANESTHESIA:   local and regional  EBL:  Total I/O In: 700 [I.V.:700] Out: -   BLOOD ADMINISTERED:none  DRAINS: none   LOCAL MEDICATIONS USED:  MARCAINE 6CC  SPECIMEN:  Excision  DISPOSITION OF SPECIMEN:  PATHOLOGY  COUNTS:  YES  TOURNIQUET:   Total Tourniquet Time Documented: Forearm (Right) - 42 minutes  DICTATION: .Other Dictation: Dictation Number [id number]  PLAN OF CARE: Discharge to home after PACU  PATIENT DISPOSITION:  PACU - hemodynamically stable.

## 2011-10-31 ENCOUNTER — Encounter (HOSPITAL_BASED_OUTPATIENT_CLINIC_OR_DEPARTMENT_OTHER): Payer: Self-pay | Admitting: Orthopedic Surgery

## 2011-12-04 ENCOUNTER — Telehealth: Payer: Self-pay | Admitting: Internal Medicine

## 2011-12-04 MED ORDER — AMOXICILLIN-POT CLAVULANATE 875-125 MG PO TABS: 1.0000 | ORAL_TABLET | Freq: Two times a day (BID) | ORAL | Status: AC

## 2011-12-04 NOTE — Telephone Encounter (Signed)
**Note De-Identified Cope Obfuscation** Called, spoke with pt.  States 2 days ago she was blowing blood out of nose.  Now, having sinus pressure, HA, head hurts when touched, head and chest congestion, prod cough with light yellow mucus, and blowing light yellow mucus out of nose.  States symptoms have worsened over the past 2 days.  Denies increased SOB, wheezing, chest tightness, chills, and sweats but unsure if she's had a fever.  Requesting something to be called in but states z pak usually does not work well.  Dr. Maple Hudson, pls advise.  Thanks!  [facility] Allergies verified Allergies  Allergen Reactions  . Milnacipran     REACTION: dizzy

## 2011-12-04 NOTE — Telephone Encounter (Signed)
**Note De-Identified Howell Obfuscation** Per CY-okay to give Augmentin 875 mg #20 take 1 po bid no refills; patient aware that Rx sent to [facility].

## 2011-12-10 ENCOUNTER — Encounter: Payer: Self-pay | Admitting: Internal Medicine

## 2011-12-10 ENCOUNTER — Ambulatory Visit (INDEPENDENT_AMBULATORY_CARE_PROVIDER_SITE_OTHER): Payer: Medicare Other | Admitting: Internal Medicine

## 2011-12-10 VITALS — BP 140/76 | HR 104 | Ht 65.0 in | Wt 285.8 lb

## 2011-12-10 DIAGNOSIS — E662 Morbid (severe) obesity with alveolar hypoventilation: Secondary | ICD-10-CM

## 2011-12-10 DIAGNOSIS — G4733 Obstructive sleep apnea (adult) (pediatric): Secondary | ICD-10-CM

## 2011-12-10 DIAGNOSIS — F17200 Nicotine dependence, unspecified, uncomplicated: Secondary | ICD-10-CM

## 2011-12-10 DIAGNOSIS — J42 Unspecified chronic bronchitis: Secondary | ICD-10-CM

## 2011-12-10 NOTE — Patient Instructions (Signed)
**Note De-Identified Wike Obfuscation** I am pleased you want to try again to get off cigarettes.   Try increasing your lorazepam 0.5 mg pills, to try taking up to 2 tabs, 3 times daily if needed for nervousness  Try Sudafed  60 mg at pharmacy counter as a decongestant. Try once or twice daily when needed for stuffy nose  Finish your augmentin antibiotic    ____________________________________________________________  Nasal neb neo  Depo 80  Order DME- [employer]  Add humidifier to the O2 concentrator- dx COPD

## 2011-12-10 NOTE — Progress Notes (Signed)
**Note De-Identified Harmsen Obfuscation** Patient ID: Hailey Hernandez, female    DOB: 08/30/1956, 56 y.o.   MRN: [id number]  HPI 13 female with known hx of chronic bronchitis , OSA/OHS , active smoker   02/05/11- 55 yoFwith chronic bronchitis, tobacco use, OSA, obesity hypoventilation syndrome. Last here December 15, 2010. Since then has continued Symbicort. Comes with sister today. Sleeping 2 nights ago- woke smothered. Had to pull off CPAP. She gradually cleared over several minutes. Not clear if she refluxed, had mucus plug or ??. Today is typical with no residual. Chest congestion varies day to day. Says cough better, not coughing up anything purulent or bloody. Still averaging 1.5 PPD, down from 3 PPD.  Compliant with CPAP- needs new mask.   07/12/2011 Acute OV  Presents for an acute office visit. Complains pain in right side x 3 days--more painful with cough and movement.  cough with green sputum--. She has been sick for 2 weeks, cough, congestion , drainage. Was exposed to URI symptoms at hospital . Started with lots of drippy nose and drainge.  No fever. Initially phoned in a zpack with no improvement. Then started on Omnicef 4 days ago.  Does have some wheezing . Now pain with inspiration esp along right lateral ribs. Tender to touch and with movement. No rash or fever.   08/10/11- 55 yoFwith chronic bronchitis, tobacco use, OSA, obesity hypoventilation syndrome. Several family members here.Marland Kitchen PCP Dr Lysbeth Galas She describes persistent ear ache on the right, nasal discharge from the left with little bit of bloody mucus. Distal coughing productively with light yellow sputum, no blood. Questions low-grade fever mainly because she sometimes feels chilly. Smoking is down from 3 packs a day to a little under one pack per day. I encouraged her to keep working at. She gives history of a cholesteatoma and told never to put water in her right ear.  12/10/11- 55 yoFwith chronic bronchitis, tobacco use, OSA, obesity hypoventilation syndrome.  Sister  here. Her sister Hailey Hernandez died in her sleep of end-stage COPD complicated by alpha-1 antitrypsin deficiency. We had called in Augmentin which did help some. Complains now of sinus pressure, blowing bloody scabs from her nose, postnasal drip, cough productive of thick mucus. Denies fever, swollen glands, headache. She has decided that she finally wants to quit smoking. Has reduced from 3 packs per day habit to one pack per day. We discussed support, medical reasons and available techniques and answered her questions.  Review of Systems-see HPI See HPI   Constitutional:   No-   weight loss, night sweats, fevers, chills, fatigue, lassitude. HEENT:   No-  headaches, difficulty swallowing, tooth/dental problems, sore throat,       No-  sneezing, itching,  ear ache,  +nasal congestion, post nasal drip,  CV:  No-   chest pain, orthopnea, PND, swelling in lower extremities, anasarca,   dizziness, palpitations Resp: No- acute  shortness of breath with exertion or at rest.              +   productive cough,  No non-productive cough,  No- coughing up of blood.              No-   change in color of mucus.  No- wheezing.   Skin: psoriasis GI:  No-   heartburn, indigestion, abdominal pain, nausea, vomiting,  GU: No-    MS:  No-   joint pain or swelling. . Neuro-     nothing unusual Psych:  No- change in mood **Note De-Identified Mane Obfuscation** or affect. No depression or anxiety.  No memory loss.   Objective:   Physical Exam General- Alert, Oriented, Affect-appropriate, Distress- none acute   sat 90% on 3L., obese Skin- extensive psoriasis,  Lymphadenopathy- none Head- atraumatic            Eyes- Gross vision intact, PERRLA, conjunctivae clear secretions            Ears- Hearing, canals- cerumen/ debris R            Nose- blood/ scabs left nostril, no-Septal dev, mucus, polyps, erosion, perforation             Throat- Mallampati II , mucosa clear , drainage- none, tonsils- atrophic. Mouth breathing Neck- flexible , trachea midline,  no stridor , thyroid nl, carotid no bruit Chest - symmetrical excursion , unlabored           Heart/CV- RRR , no murmur , no gallop  , no rub, nl s1 s2                           - JVD- none , edema- none, stasis changes+ bilateral, varices- none           Lung- decreased w/ wheezy cough , dullness-none, rub- none           Chest wall-  Abd-  Br/ Gen/ Rectal- Not done, not indicated Extrem- cyanosis- none, clubbing, none, atrophy- none, strength- nl Neuro- grossly intact to observation

## 2011-12-11 DIAGNOSIS — J019 Acute sinusitis, unspecified: Secondary | ICD-10-CM

## 2011-12-11 MED ORDER — METHYLPREDNISOLONE ACETATE 80 MG/ML IJ SUSP
80.0000 mg | Freq: Once | INTRAMUSCULAR | Status: AC
  Administered 2011-12-11: 80 mg via INTRAMUSCULAR

## 2011-12-11 MED ORDER — PHENYLEPHRINE HCL 1 % NA SOLN
3.0000 [drp] | Freq: Once | NASAL | Status: AC
  Administered 2011-12-11: 3 [drp] via NASAL

## 2011-12-13 NOTE — Assessment & Plan Note (Signed)
**Note De-Identified Wall Obfuscation** Expresses motivation to stop smoking. We have reviewed techniques. Plan emotional support with Ativan 0.5 mg up to 1 mg 3 times a day if needed

## 2011-12-13 NOTE — Assessment & Plan Note (Signed)
**Note De-Identified Crispen Obfuscation** Rhinitis or sinusitis. Plan nasal decongestant nebulizer, Depo-Medrol, Sudafed with discussion.

## 2011-12-13 NOTE — Assessment & Plan Note (Signed)
**Note De-Identified Stockley Obfuscation** Encouraged to make a real tried smoking cessation this time. At humidifier to concentrator. Finish Augmentin.

## 2011-12-13 NOTE — Assessment & Plan Note (Signed)
**Note De-identified Pichon Obfuscation** Importance of weight loss emphasized 

## 2011-12-21 ENCOUNTER — Telehealth: Payer: Self-pay | Admitting: Internal Medicine

## 2011-12-21 MED ORDER — PROMETHAZINE-CODEINE 6.25-10 MG/5ML PO SYRP: 5.0000 mL | ORAL_SOLUTION | Freq: Four times a day (QID) | ORAL | Status: AC | PRN

## 2011-12-21 NOTE — Telephone Encounter (Signed)
**Note De-Identified Loredo Obfuscation** Spoke with pt and advised of recs per CDY. Pt verbalized understanding. Rx was called to pharm.

## 2011-12-21 NOTE — Telephone Encounter (Signed)
**Note De-Identified Smithson Obfuscation** Offer cough syrup - phenergan codeine  200 ml, 1 teaspoon  Every 6 hours as needed for cough

## 2011-12-21 NOTE — Telephone Encounter (Signed)
**Note De-Identified Kepner Obfuscation** Pt was seen on 12-10-11 and given depo and nasal neb. She states she finished augmentin rx on 12-18-11. She states she does not feel any improvement. She states she is having mostly a dry cough, but at times "some phlegm will break loose" and she can cough it up and it is yellow. She c/o a lot of nasal congestion and PND that is irritating her to cough and causing her to cough more. She states cough is worse at night. Pt states she has taken a whole box of sudafed without relief as well.  Please advise. Carron Curie, CMA Allergies  Allergen Reactions  . Milnacipran     REACTION: dizzy

## 2011-12-25 ENCOUNTER — Telehealth: Payer: Self-pay | Admitting: Internal Medicine

## 2011-12-25 MED ORDER — AMOXICILLIN-POT CLAVULANATE 500-125 MG PO TABS: ORAL_TABLET | ORAL | Status: DC

## 2011-12-25 NOTE — Telephone Encounter (Signed)
**Note De-identified Morash Obfuscation** Spoke with the pt and notified of recs per CDY  Pt verbalized understanding  Rx was sent to pharm 

## 2011-12-25 NOTE — Telephone Encounter (Signed)
**Note De-Identified Vint Obfuscation** Per CY-okay to give Augmentin 500 mg #20 take 1 po bid with meals no refills.

## 2011-12-25 NOTE — Telephone Encounter (Signed)
**Note De-Identified Aycock Obfuscation** Pt's daughter says the cough has improved but she now has green nasal drainage, headaches and her eyes are matting shut. She's not sure if the pt has any fever. She believes the pt needs an abx. Pls advise. Allergies  Allergen Reactions  . Milnacipran     REACTION: dizzy

## 2012-03-11 ENCOUNTER — Ambulatory Visit: Payer: Medicare Other | Admitting: Internal Medicine

## 2012-03-25 ENCOUNTER — Telehealth: Payer: Self-pay | Admitting: Cardiology

## 2012-03-25 NOTE — Telephone Encounter (Signed)
**Note De-Identified Vanderzanden Obfuscation** New problem:  Patient C/O blood pressure is low - 111/65. Pulse 124. Last night 98/55, pulse 137. Chest pain. No er visit.

## 2012-03-26 NOTE — Telephone Encounter (Signed)
**Note De-Identified Kaylor Obfuscation** Pt calling with complaints of feeling very weak and tired over the past several weeks. "I can hardly get up to go"  She reports her blood pressure dropping from 127/70 to 98/55  HR from 110 to 127.  She denies any recent GI distress and reports that she is eating and drinking well.  She is taking her Lisinopril when she feels like she needs it.  Explained to pt that it has been so long since we have seen her that she needs to be evaluated in order to be treated.  She is added onto the schedule in the [place] office next week.  She will call back if her s/s get worse before then

## 2012-04-02 ENCOUNTER — Encounter: Payer: Self-pay | Admitting: Cardiology

## 2012-04-02 ENCOUNTER — Ambulatory Visit (INDEPENDENT_AMBULATORY_CARE_PROVIDER_SITE_OTHER): Payer: Medicare Other | Admitting: Cardiology

## 2012-04-02 VITALS — BP 160/80 | HR 101 | Ht 65.0 in | Wt 272.0 lb

## 2012-04-02 DIAGNOSIS — I10 Essential (primary) hypertension: Secondary | ICD-10-CM

## 2012-04-02 DIAGNOSIS — R5381 Other malaise: Secondary | ICD-10-CM

## 2012-04-02 DIAGNOSIS — R5383 Other fatigue: Secondary | ICD-10-CM

## 2012-04-02 DIAGNOSIS — R55 Syncope and collapse: Secondary | ICD-10-CM

## 2012-04-02 NOTE — Patient Instructions (Addendum)
**Note De-Identified C[place] Obfuscation** The current medical regimen is effective;  continue present plan and medications.  Please keep a blood pressure diary.  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  Follow in 1 month with Dr Antoine Poche in [place]

## 2012-04-02 NOTE — Progress Notes (Signed)
**Note De-Identified Lintner Obfuscation** HPI The patient presents for followup. She has a prior history of syncope. I saw her for this in evaluation included an echo. These weren't difficult acoustic windows but there was probably a mildly reduced ejection fraction. Since that time she has had no further syncope. However, she has noticed over the last few weeks her blood pressure has been low. It has been as low as 90/50. She does feel lightheaded with this. She's actually stopped taking her lisinopril. Her heart rate has been at times in the 120s. She has chronic dyspnea and wears home O2 with activities. She denies any new shortness of breath, PND or orthopnea. His chronically slept with her head somewhat elevated. She does have some mild orthostatic symptoms. She's not had any chest pressure, neck or arm discomfort.  Allergies  Allergen Reactions  . Milnacipran     REACTION: dizzy    Current Outpatient Prescriptions  Medication Sig Dispense Refill  . amoxicillin-clavulanate (AUGMENTIN) 500-125 MG per tablet Take 1 tablet twice daily with meals  20 tablet  0  . budesonide-formoterol (SYMBICORT) 160-4.5 MCG/ACT inhaler Inhale 2 puffs into the lungs daily.       . fluticasone (VERAMYST) 27.5 MCG/SPRAY nasal spray Place 2 sprays into the nose daily as needed.       . folic acid (FOLVITE) 1 MG tablet Take 1 mg by mouth daily.       Marland Kitchen ibuprofen (ADVIL,MOTRIN) 200 MG tablet Take 200 mg by mouth every 6 (six) hours as needed.      Marland Kitchen lisinopril (PRINIVIL,ZESTRIL) 10 MG tablet Take 10 mg by mouth daily.       Marland Kitchen LORazepam (ATIVAN) 0.5 MG tablet Take 0.5 mg by mouth daily as needed.       . methotrexate (RHEUMATREX) 2.5 MG tablet 2 (two) times a week. Caution:Chemotherapy. Protect from light.      . Nebulizers (COMPRESSOR/NEBULIZER) MISC Use up to 4 times daily when needed  1 each  0  . omeprazole (PRILOSEC) 20 MG capsule Take 20 mg by mouth daily.       Marland Kitchen albuterol (PROVENTIL) (2.5 MG/3ML) 0.083% nebulizer solution Take 3 mLs (2.5 mg  total) by nebulization every 4 (four) hours as needed for wheezing or shortness of breath.  120 vial  2  . DISCONTD: doxepin (SINEQUAN) 10 MG capsule 1-2 at bedtime       . DISCONTD: fexofenadine (ALLEGRA) 180 MG tablet Take 180 mg by mouth daily.          Past Medical History  Diagnosis Date  . Tachyarrhythmia   . Tobacco abuse   . Chronic bronchitis   . History of thyroid cancer   . Rhinitis   . OSA (obstructive sleep apnea)   . Polymyalgia   . Diverticulitis   . DJD (degenerative joint disease)     spine  . Anxiety disorder   . Arthritis   . COPD (chronic obstructive pulmonary disease)   . Hyperlipidemia   . HTN (hypertension)   . PONV (postoperative nausea and vomiting)   . Shortness of breath   . Cancer     thyroid  . GERD (gastroesophageal reflux disease)   . Anxiety   . Fibromyalgia   . Arthritis     bil legs    Past Surgical History  Procedure Date  . Total abdominal hysterectomy   . Cholecystectomy   . Partial thymectomy   . Appendectomy   . Carpal tunnel release   . Left shoulder spurs   . **Note De-Identified Joslyn Obfuscation** Orif right lower leg   . Cholesteatoma excision     right ear  . Carpal tunnel release 10/30/2011    Procedure: CARPAL TUNNEL RELEASE;  Surgeon: Nicki Reaper, MD;  Location: [facility];  Service: Orthopedics;  Laterality: Right;  and mass excision    ROS:  As stated in the HPI and negative for all other systems.  PHYSICAL EXAM BP 160/80  Pulse 101  Ht 5\' 5"  (1.651 m)  Wt 272 lb (123.378 kg)  BMI 45.26 kg/m2 GENERAL:  Well appearing HEENT:  Pupils equal round and reactive, fundi not visualized, oral mucosa unremarkable, edentulous NECK:  No jugular venous distention, waveform within normal limits, carotid upstroke brisk and symmetric, no bruits, no thyromegaly LYMPHATICS:  No cervical, inguinal adenopathy LUNGS:  Clear to auscultation bilaterally BACK:  No CVA tenderness CHEST:  Unremarkable HEART:  PMI not displaced or sustained,S1 and S2 within  normal limits, no S3, no S4, no clicks, no rubs, no murmurs ABD:  Flat, positive bowel sounds normal in frequency in pitch, no bruits, no rebound, no guarding, no midline pulsatile mass, no hepatomegaly, no splenomegaly, obese EXT:  2 plus pulses throughout, no edema, no cyanosis no clubbing SKIN:  Diffuse psoriatic rashes no nodules NEURO:  Cranial nerves II through XII grossly intact, motor grossly intact throughout PSYCH:  Cognitively intact, oriented to person place and time   EKG:  Sinus tachycardia, rate 100, axis within normal limits, borderline interventricular conduction delay, no acute ST-T wave changes.  No change from previous. 04/02/2012   ASSESSMENT AND PLAN

## 2012-04-02 NOTE — Assessment & Plan Note (Signed)
**Note De-Identified Pavek Obfuscation** She has had no further episodes. No further workup is suggested.

## 2012-04-02 NOTE — Assessment & Plan Note (Signed)
**Note De-Identified Freeberg Obfuscation** I will be checking a CBC and TSH mentioned.

## 2012-04-02 NOTE — Assessment & Plan Note (Signed)
**Note De-Identified Wieck Obfuscation** Her blood pressure is somewhat labile. She's going to bring her blood pressure cuff back today and we are going to check it for accuracy. She is then going to keep a blood pressure diary so that I can further understand treatment. I think any imaging or other testing is indicated other than a thyroid and CBC. She can remain off of her lisinopril for now.

## 2012-04-08 ENCOUNTER — Ambulatory Visit: Payer: Medicare Other | Admitting: Internal Medicine

## 2012-04-11 ENCOUNTER — Other Ambulatory Visit: Payer: Self-pay | Admitting: Cardiology

## 2012-04-11 LAB — CBC
HCT: 49 % — ABNORMAL HIGH (ref 36.0–46.0)
MCH: 32.5 pg (ref 26.0–34.0)
MCHC: 32.4 g/dL (ref 30.0–36.0)
MCV: 100.2 fL — ABNORMAL HIGH (ref 78.0–100.0)
RDW: 16.2 % — ABNORMAL HIGH (ref 11.5–15.5)

## 2012-04-19 ENCOUNTER — Encounter: Payer: Self-pay | Admitting: Internal Medicine

## 2012-04-19 DIAGNOSIS — Z85850 Personal history of malignant neoplasm of thyroid: Secondary | ICD-10-CM | POA: Insufficient documentation

## 2012-04-19 DIAGNOSIS — R000 Tachycardia, unspecified: Secondary | ICD-10-CM | POA: Insufficient documentation

## 2012-04-19 DIAGNOSIS — M353 Polymyalgia rheumatica: Secondary | ICD-10-CM | POA: Insufficient documentation

## 2012-04-19 DIAGNOSIS — G4733 Obstructive sleep apnea (adult) (pediatric): Secondary | ICD-10-CM | POA: Insufficient documentation

## 2012-04-19 DIAGNOSIS — K5792 Diverticulitis of intestine, part unspecified, without perforation or abscess without bleeding: Secondary | ICD-10-CM | POA: Insufficient documentation

## 2012-04-19 DIAGNOSIS — M1990 Unspecified osteoarthritis, unspecified site: Secondary | ICD-10-CM | POA: Insufficient documentation

## 2012-04-19 DIAGNOSIS — K219 Gastro-esophageal reflux disease without esophagitis: Secondary | ICD-10-CM | POA: Insufficient documentation

## 2012-04-19 DIAGNOSIS — E785 Hyperlipidemia, unspecified: Secondary | ICD-10-CM | POA: Insufficient documentation

## 2012-04-19 DIAGNOSIS — Z720 Tobacco use: Secondary | ICD-10-CM | POA: Insufficient documentation

## 2012-04-19 DIAGNOSIS — I10 Essential (primary) hypertension: Secondary | ICD-10-CM | POA: Insufficient documentation

## 2012-04-19 DIAGNOSIS — J449 Chronic obstructive pulmonary disease, unspecified: Secondary | ICD-10-CM | POA: Insufficient documentation

## 2012-04-19 DIAGNOSIS — F419 Anxiety disorder, unspecified: Secondary | ICD-10-CM | POA: Insufficient documentation

## 2012-04-19 DIAGNOSIS — Z0000 Encounter for general adult medical examination without abnormal findings: Secondary | ICD-10-CM | POA: Insufficient documentation

## 2012-04-25 ENCOUNTER — Ambulatory Visit (INDEPENDENT_AMBULATORY_CARE_PROVIDER_SITE_OTHER): Payer: Medicare Other | Admitting: Internal Medicine

## 2012-04-25 ENCOUNTER — Encounter: Payer: Self-pay | Admitting: Internal Medicine

## 2012-04-25 ENCOUNTER — Other Ambulatory Visit (INDEPENDENT_AMBULATORY_CARE_PROVIDER_SITE_OTHER): Payer: Medicare Other

## 2012-04-25 VITALS — BP 140/82 | HR 93 | Temp 97.2°F | Ht 65.0 in | Wt 270.1 lb

## 2012-04-25 DIAGNOSIS — M545 Low back pain: Secondary | ICD-10-CM

## 2012-04-25 DIAGNOSIS — Z0000 Encounter for general adult medical examination without abnormal findings: Secondary | ICD-10-CM

## 2012-04-25 DIAGNOSIS — I10 Essential (primary) hypertension: Secondary | ICD-10-CM

## 2012-04-25 DIAGNOSIS — M25519 Pain in unspecified shoulder: Secondary | ICD-10-CM

## 2012-04-25 DIAGNOSIS — M79609 Pain in unspecified limb: Secondary | ICD-10-CM

## 2012-04-25 DIAGNOSIS — M5090 Cervical disc disorder, unspecified, unspecified cervical region: Secondary | ICD-10-CM | POA: Insufficient documentation

## 2012-04-25 DIAGNOSIS — M7072 Other bursitis of hip, left hip: Secondary | ICD-10-CM

## 2012-04-25 DIAGNOSIS — M79662 Pain in left lower leg: Secondary | ICD-10-CM

## 2012-04-25 DIAGNOSIS — M25512 Pain in left shoulder: Secondary | ICD-10-CM

## 2012-04-25 DIAGNOSIS — M76899 Other specified enthesopathies of unspecified lower limb, excluding foot: Secondary | ICD-10-CM

## 2012-04-25 DIAGNOSIS — M549 Dorsalgia, unspecified: Secondary | ICD-10-CM

## 2012-04-25 LAB — CBC WITH DIFFERENTIAL/PLATELET
Basophils Relative: 0.5 % (ref 0.0–3.0)
Eosinophils Relative: 3.2 % (ref 0.0–5.0)
Lymphocytes Relative: 16.8 % (ref 12.0–46.0)
MCV: 99 fl (ref 78.0–100.0)
Monocytes Relative: 4.4 % (ref 3.0–12.0)
Neutrophils Relative %: 75.1 % (ref 43.0–77.0)
Platelets: 197 10*3/uL (ref 150.0–400.0)
RBC: 4.69 Mil/uL (ref 3.87–5.11)
WBC: 9.5 10*3/uL (ref 4.5–10.5)

## 2012-04-25 LAB — URINALYSIS, ROUTINE W REFLEX MICROSCOPIC
Bilirubin Urine: NEGATIVE
Leukocytes, UA: NEGATIVE
Nitrite: NEGATIVE
Specific Gravity, Urine: 1.015 (ref 1.000–1.030)
pH: 7 (ref 5.0–8.0)

## 2012-04-25 LAB — HEPATIC FUNCTION PANEL
ALT: 36 U/L — ABNORMAL HIGH (ref 0–35)
AST: 43 U/L — ABNORMAL HIGH (ref 0–37)
Albumin: 3.9 g/dL (ref 3.5–5.2)
Total Bilirubin: 0.7 mg/dL (ref 0.3–1.2)

## 2012-04-25 LAB — LIPID PANEL
Cholesterol: 264 mg/dL — ABNORMAL HIGH (ref 0–200)
HDL: 29.1 mg/dL — ABNORMAL LOW (ref >39.00)
VLDL: 53.8 mg/dL — ABNORMAL HIGH (ref 0.0–40.0)

## 2012-04-25 LAB — BASIC METABOLIC PANEL
BUN: 16 mg/dL (ref 6–23)
Chloride: 98 mEq/L (ref 96–112)
Creatinine, Ser: 0.7 mg/dL (ref 0.4–1.2)
Glucose, Bld: 123 mg/dL — ABNORMAL HIGH (ref 70–99)
Potassium: 4.1 mEq/L (ref 3.5–5.1)

## 2012-04-25 LAB — LDL CHOLESTEROL, DIRECT: Direct LDL: 181.5 mg/dL

## 2012-04-25 LAB — TSH: TSH: 1.54 u[IU]/mL (ref 0.35–5.50)

## 2012-04-25 MED ORDER — HYDROCODONE-ACETAMINOPHEN 5-500 MG PO TABS: 1.0000 | ORAL_TABLET | Freq: Three times a day (TID) | ORAL | Status: AC | PRN

## 2012-04-25 MED ORDER — TETANUS-DIPHTH-ACELL PERTUSSIS 5-2.5-18.5 LF-MCG/0.5 IM SUSP: 0.5000 mL | Freq: Once | INTRAMUSCULAR | Status: DC

## 2012-04-25 NOTE — Assessment & Plan Note (Signed)
**Note De-identified Tomkins Obfuscation** Overall doing well, age appropriate education and counseling updated, referrals for preventative services and immunizations addressed, dietary and smoking counseling addressed, most recent labs and ECG reviewed.  I have personally reviewed and have noted: 1) the patient's medical and social history 2) The pt's use of alcohol, tobacco, and illicit drugs 3) The patient's current medications and supplements 4) Functional ability including ADL's, fall risk, home safety risk, hearing and visual impairment 5) Diet and physical activities 6) Evidence for depression or mood disorder 7) The patient's height, weight, and BMI have been recorded in the chart I have made referrals, and provided counseling and education based on review of the above For tetanus today 

## 2012-04-25 NOTE — Patient Instructions (Addendum)
**Note De-Identified Amano Obfuscation** Take all new medications as prescribed  - the pain medication Continue all other medications as before, except OK to stay off the lisinopril for now as you are doing Please have the pharmacy call with any refills you may need. You had the tetanus shot today Please remember to followup with your GYN for the yearly pap smear and/or mammogram Please go to LAB in the [facility] for the blood and/or urine tests to be done today You will be contacted by phone if any changes need to be made immediately.  Otherwise, you will receive a letter about your results with an explanation. You will be contacted regarding the referral for: orthopedic for Low back pain, left leg pain and left hip bursitis Please return in 6 months, or sooner if needed

## 2012-04-26 ENCOUNTER — Encounter: Payer: Self-pay | Admitting: Internal Medicine

## 2012-04-26 DIAGNOSIS — M549 Dorsalgia, unspecified: Secondary | ICD-10-CM | POA: Insufficient documentation

## 2012-04-26 NOTE — Assessment & Plan Note (Signed)
**Note De-Identified Beadle Obfuscation** Left lower back, left lateral hip and pain below left knee that I think may be 3 different problems - for ortho referral, pain control

## 2012-04-26 NOTE — Progress Notes (Signed)
**Note De-Identified Dubs Obfuscation** Subjective:    Patient ID: Hailey Hernandez, female    DOB: 1956-03-11, 56 y.o.   MRN: [id number]  HPI  Here for wellness and to establish as new pt;  Overall doing ok;  Pt denies CP, worsening SOB, DOE, wheezing, orthopnea, PND, worsening LE edema, palpitations, dizziness or syncope.  Pt denies neurological change such as new Headache, facial or extremity weakness.  Pt denies polydipsia, polyuria, or low sugar symptoms. Pt states overall good compliance with treatment and medications, good tolerability, and trying to follow lower cholesterol diet.  Pt denies worsening depressive symptoms, suicidal ideation or panic. No fever, wt loss, night sweats, loss of appetite, or other constitutional symptoms.  Pt states good ability with ADL's, low fall risk, home safety reviewed and adequate, no significant changes in hearing or vision, and occasionally active with exercise.  Has several pain problems with the Left lower back, left lateral hip area, right shoulder and other pain also to Left leg below the knee that does not seem radicular.   Past Medical History  Diagnosis Date  . Tachyarrhythmia   . Tobacco abuse   . Chronic bronchitis   . History of thyroid cancer   . Rhinitis   . OSA (obstructive sleep apnea)   . Polymyalgia   . Diverticulitis   . DJD (degenerative joint disease)     spine  . Anxiety disorder   . Arthritis   . COPD (chronic obstructive pulmonary disease)   . Hyperlipidemia   . HTN (hypertension)   . GERD (gastroesophageal reflux disease)   . Fibromyalgia   . Arthritis     bil legs  . Asthma   . Cancer   . Depression   . Glaucoma   . Allergy    Past Surgical History  Procedure Date  . Total abdominal hysterectomy   . Cholecystectomy   . Partial thymectomy   . Appendectomy   . Carpal tunnel release   . Left shoulder spurs   . Orif right lower leg   . Cholesteatoma excision     right ear  . Carpal tunnel release 10/30/2011    Procedure: CARPAL TUNNEL RELEASE;   Surgeon: Nicki Reaper, MD;  Location: [facility];  Service: Orthopedics;  Laterality: Right;  and mass excision    reports that she has been smoking Cigarettes.  She has a 45 pack-year smoking history. She has never used smokeless tobacco. She reports that she does not drink alcohol or use illicit drugs. family history includes Allergies in her sister; Alpha-1 antitrypsin deficiency in her sisters; Asthma in her sister; Cancer in her brother, mother, and sister; Emphysema in her sisters; Heart disease in her father and mother; and Tuberculosis in her other. Allergies  Allergen Reactions  . Milnacipran     REACTION: dizzy  . Tramadol     Did not work   Current Outpatient Prescriptions on File Prior to Visit  Medication Sig Dispense Refill  . budesonide-formoterol (SYMBICORT) 160-4.5 MCG/ACT inhaler Inhale 2 puffs into the lungs daily.       . fluticasone (VERAMYST) 27.5 MCG/SPRAY nasal spray Place 2 sprays into the nose daily as needed.       . folic acid (FOLVITE) 1 MG tablet Take 1 mg by mouth daily.       Marland Kitchen ibuprofen (ADVIL,MOTRIN) 200 MG tablet Take 200 mg by mouth every 6 (six) hours as needed.      Marland Kitchen LORazepam (ATIVAN) 0.5 MG tablet Take 0.5 mg by mouth **Note De-Identified Doucette Obfuscation** daily as needed.       . methotrexate (RHEUMATREX) 2.5 MG tablet 2 (two) times a week. Caution:Chemotherapy. Protect from light.      . Nebulizers (COMPRESSOR/NEBULIZER) MISC Use up to 4 times daily when needed  1 each  0  . omeprazole (PRILOSEC) 20 MG capsule Take 20 mg by mouth daily.       Marland Kitchen albuterol (PROVENTIL) (2.5 MG/3ML) 0.083% nebulizer solution Take 3 mLs (2.5 mg total) by nebulization every 4 (four) hours as needed for wheezing or shortness of breath.  120 vial  2  . amoxicillin-clavulanate (AUGMENTIN) 500-125 MG per tablet Take 1 tablet twice daily with meals  20 tablet  0  . DISCONTD: doxepin (SINEQUAN) 10 MG capsule 1-2 at bedtime       . DISCONTD: fexofenadine (ALLEGRA) 180 MG tablet Take 180 mg by mouth  daily.         No current facility-administered medications on file prior to visit.   Review of Systems Review of Systems  Constitutional: Negative for diaphoresis, activity change, appetite change and unexpected weight change.  HENT: Negative for hearing loss, ear pain, facial swelling, mouth sores and neck stiffness.   Eyes: Negative for pain, redness and visual disturbance.  Respiratory: Negative for shortness of breath and wheezing.   Cardiovascular: Negative for chest pain and palpitations.  Gastrointestinal: Negative for diarrhea, blood in stool, abdominal distention and rectal pain.  Genitourinary: Negative for hematuria, flank pain and decreased urine volume.  Musculoskeletal: Negative for myalgias and joint swelling.  Skin: Negative for color change and wound.  Neurological: Negative for syncope and numbness.  Hematological: Negative for adenopathy.  Psychiatric/Behavioral: Negative for hallucinations, self-injury, decreased concentration and agitation.      Objective:   Physical Exam BP 140/82  Pulse 93  Temp 97.2 F (36.2 C) (Oral)  Ht 5\' 5"  (1.651 m)  Wt 270 lb 2 oz (122.528 kg)  BMI 44.95 kg/m2  SpO2 92% Physical Exam  VS noted, walks with cane Constitutional: Pt is oriented to person, place, and time. Appears well-developed and well-nourished.  HENT:  Head: Normocephalic and atraumatic.  Right Ear: External ear normal.  Left Ear: External ear normal.  Nose: Nose normal.  Mouth/Throat: Oropharynx is clear and moist.  Eyes: Conjunctivae and EOM are normal. Pupils are equal, round, and reactive to light.  Neck: Normal range of motion. Neck supple. No JVD present. No tracheal deviation present.  Cardiovascular: Normal rate, regular rhythm, normal heart sounds and intact distal pulses.   Pulmonary/Chest: Effort normal and breath sounds normal.  Abdominal: Soft. Bowel sounds are normal. There is no tenderness.  Musculoskeletal: Normal range of motion. Exhibits no  edema.  Lymphadenopathy:  Has no cervical adenopathy.  Neurological: Pt is alert and oriented to person, place, and time. Pt has normal reflexes. No cranial nerve deficit. Motor/dtr intact Skin: Skin is warm and dry. No rash noted.  Psychiatric:  Has  normal mood and affect. Behavior is normal. 1+ nervous Mild to mod tender over left lateral greater trochanter    Assessment & Plan:

## 2012-04-28 ENCOUNTER — Other Ambulatory Visit: Payer: Self-pay | Admitting: Internal Medicine

## 2012-04-28 ENCOUNTER — Encounter: Payer: Self-pay | Admitting: Internal Medicine

## 2012-04-28 MED ORDER — ATORVASTATIN CALCIUM 20 MG PO TABS: 20.0000 mg | ORAL_TABLET | Freq: Every day | ORAL | Status: DC

## 2012-05-07 ENCOUNTER — Encounter: Payer: Self-pay | Admitting: Cardiology

## 2012-05-07 ENCOUNTER — Ambulatory Visit (INDEPENDENT_AMBULATORY_CARE_PROVIDER_SITE_OTHER): Payer: Medicare Other | Admitting: Cardiology

## 2012-05-07 VITALS — BP 140/90 | HR 107 | Ht 65.0 in | Wt 266.0 lb

## 2012-05-07 DIAGNOSIS — I10 Essential (primary) hypertension: Secondary | ICD-10-CM

## 2012-05-07 DIAGNOSIS — E785 Hyperlipidemia, unspecified: Secondary | ICD-10-CM

## 2012-05-07 DIAGNOSIS — R000 Tachycardia, unspecified: Secondary | ICD-10-CM

## 2012-05-07 DIAGNOSIS — R55 Syncope and collapse: Secondary | ICD-10-CM

## 2012-05-07 NOTE — Patient Instructions (Addendum)
**Note De-identified Berman Obfuscation** The current medical regimen is effective;  continue present plan and medications.  Follow up as needed 

## 2012-05-07 NOTE — Progress Notes (Signed)
**Note De-Identified Vinciguerra Obfuscation** HPI The patient presents for followup. She has a prior history of syncope. I saw her for this in evaluation included an echo. These weren't difficult acoustic windows but there was probably a mildly reduced ejection fraction. She was having some low blood pressures. However, since stopping lisinopril her blood pressures have been normal. I reviewed a blood pressure diary today and they are well controlled with out of lisinopril without any hypotensive episodes. She's had no presyncope or syncope. She has chronic dyspnea with home O2. She has had some chest discomfort. This is left upper chest and under the left breast. She was having a daily relieved with TUMS.  However, she's not had any of this in 2 weeks. She wasn't describing associated symptoms such as nausea vomiting or diaphoresis. She's not had any new palpitations. She does have mild chronic lower extremity swelling. She unfortunately is still smoking cigarettes.  Allergies  Allergen Reactions  . Milnacipran     REACTION: dizzy  . Tramadol     Did not work    Current Outpatient Prescriptions  Medication Sig Dispense Refill  . albuterol (PROVENTIL) (2.5 MG/3ML) 0.083% nebulizer solution Take 3 mLs (2.5 mg total) by nebulization every 4 (four) hours as needed for wheezing or shortness of breath.  120 vial  2  . amoxicillin-clavulanate (AUGMENTIN) 500-125 MG per tablet Take 1 tablet twice daily with meals  20 tablet  0  . atorvastatin (LIPITOR) 20 MG tablet Take 1 tablet (20 mg total) by mouth daily.  90 tablet  3  . budesonide-formoterol (SYMBICORT) 160-4.5 MCG/ACT inhaler Inhale 2 puffs into the lungs daily.       . fluticasone (VERAMYST) 27.5 MCG/SPRAY nasal spray Place 2 sprays into the nose daily as needed.       . folic acid (FOLVITE) 1 MG tablet Take 1 mg by mouth daily.       Marland Kitchen HYDROcodone-acetaminophen (VICODIN) 5-500 MG per tablet Take 1 tablet by mouth every 6 (six) hours as needed.      Marland Kitchen ibuprofen (ADVIL,MOTRIN) 200 MG  tablet Take 200 mg by mouth every 6 (six) hours as needed.      Marland Kitchen LORazepam (ATIVAN) 0.5 MG tablet Take 0.5 mg by mouth daily as needed.       . methotrexate (RHEUMATREX) 2.5 MG tablet 2 (two) times a week. Caution:Chemotherapy. Protect from light.      . Nebulizers (COMPRESSOR/NEBULIZER) MISC Use up to 4 times daily when needed  1 each  0  . omeprazole (PRILOSEC) 20 MG capsule Take 20 mg by mouth daily.       Marland Kitchen DISCONTD: doxepin (SINEQUAN) 10 MG capsule 1-2 at bedtime       . DISCONTD: fexofenadine (ALLEGRA) 180 MG tablet Take 180 mg by mouth daily.         Current Facility-Administered Medications  Medication Dose Route Frequency Provider Last Rate Last Dose  . TDaP (BOOSTRIX) injection 0.5 mL  0.5 mL Intramuscular Once Corwin Levins, MD        Past Medical History  Diagnosis Date  . Tachyarrhythmia   . Tobacco abuse   . Chronic bronchitis   . History of thyroid cancer   . Rhinitis   . OSA (obstructive sleep apnea)   . Polymyalgia   . Diverticulitis   . DJD (degenerative joint disease)     spine  . Anxiety disorder   . Arthritis   . COPD (chronic obstructive pulmonary disease)   . Hyperlipidemia   . **Note De-Identified Arseneau Obfuscation** HTN (hypertension)   . GERD (gastroesophageal reflux disease)   . Fibromyalgia   . Arthritis     bil legs  . Asthma   . Cancer   . Depression   . Glaucoma   . Allergy     Past Surgical History  Procedure Date  . Total abdominal hysterectomy   . Cholecystectomy   . Partial thymectomy   . Appendectomy   . Carpal tunnel release   . Left shoulder spurs   . Orif right lower leg   . Cholesteatoma excision     right ear  . Carpal tunnel release 10/30/2011    Procedure: CARPAL TUNNEL RELEASE;  Surgeon: Nicki Reaper, MD;  Location: [facility];  Service: Orthopedics;  Laterality: Right;  and mass excision    ROS:  As stated in the HPI and negative for all other systems.  PHYSICAL EXAM BP 140/90  Pulse 107  Ht 5\' 5"  (1.651 m)  Wt 266 lb (120.657 kg)  BMI  44.26 kg/m2 GENERAL:  Well appearing NECK:  No jugular venous distention, waveform within normal limits, carotid upstroke brisk and symmetric, no bruits, no thyromegaly LUNGS:  Clear to auscultation bilaterally, decreased breath sounds diffusely BACK:  No CVA tenderness CHEST:  Unremarkable HEART:  PMI not displaced or sustained,S1 and S2 within normal limits, no S3, no S4, no clicks, no rubs, no murmurs ABD:  Flat, positive bowel sounds normal in frequency in pitch, no bruits, no rebound, no guarding, no midline pulsatile mass, no hepatomegaly, no splenomegaly, obese EXT:  2 plus pulses throughout, no edema, no cyanosis no clubbing, chronic venous stasis changes SKIN:  Diffuse psoriatic rashes no nodules   ASSESSMENT AND PLAN  HYPERTENSION -  I think her blood pressure is reasonably controlled without any other medications added and off of lisinopril. No change in therapy is indicated.   SYNCOPE -  This may have been related to her low blood pressures. She's no longer having this. No change in therapy is indicated.   CHEST PAIN-  This is atypical. She's no longer having this. No further workup is indicated.   TOBACCO ABUSE -  Given her previous history of hospitalizations for bipolar affective disorder I would not feel comfortable prescribing Chantix but she will discuss this with Dr. Maple Hudson.

## 2012-05-15 ENCOUNTER — Ambulatory Visit (INDEPENDENT_AMBULATORY_CARE_PROVIDER_SITE_OTHER): Payer: Medicare Other | Admitting: Internal Medicine

## 2012-05-15 ENCOUNTER — Encounter: Payer: Self-pay | Admitting: Internal Medicine

## 2012-05-15 ENCOUNTER — Ambulatory Visit (INDEPENDENT_AMBULATORY_CARE_PROVIDER_SITE_OTHER)
Admission: RE | Admit: 2012-05-15 | Discharge: 2012-05-15 | Disposition: A | Discharge status: Home / Self Care | Payer: Medicare Other | Source: Ambulatory Visit | Attending: Internal Medicine | Admitting: Internal Medicine

## 2012-05-15 VITALS — BP 142/80 | HR 101 | Ht 65.0 in | Wt 271.6 lb

## 2012-05-15 DIAGNOSIS — G4733 Obstructive sleep apnea (adult) (pediatric): Secondary | ICD-10-CM

## 2012-05-15 DIAGNOSIS — J449 Chronic obstructive pulmonary disease, unspecified: Secondary | ICD-10-CM

## 2012-05-15 DIAGNOSIS — F17200 Nicotine dependence, unspecified, uncomplicated: Secondary | ICD-10-CM

## 2012-05-15 MED ORDER — ALBUTEROL SULFATE (2.5 MG/3ML) 0.083% IN NEBU: 2.5000 mg | INHALATION_SOLUTION | RESPIRATORY_TRACT | Status: DC | PRN

## 2012-05-15 NOTE — Patient Instructions (Addendum)
**Note De-Identified Mauch Obfuscation** Order- CXR   Dx COPD              PCC- DME  Change from [facility] to [facility]    Dx COPD                                  O2  3L/M continuous and portable                                    CPAP 13 cwp, mask of choice, humidifier and supplies     Dx OSA  [facility] Parking form done

## 2012-05-15 NOTE — Progress Notes (Signed)
**Note De-Identified Schroepfer Obfuscation** Patient ID: Hailey Hernandez, female    DOB: 1956-04-03, 56 y.o.   MRN: [id number]  HPI 56 female with known hx of chronic bronchitis , OSA/OHS , active smoker   02/05/11- 56 yoFwith chronic bronchitis, tobacco use, OSA, obesity hypoventilation syndrome. Last here December 15, 2010. Since then has continued Symbicort. Comes with sister today. Sleeping 2 nights ago- woke smothered. Had to pull off CPAP. She gradually cleared over several minutes. Not clear if she refluxed, had mucus plug or ??. Today is typical with no residual. Chest congestion varies day to day. Says cough better, not coughing up anything purulent or bloody. Still averaging 1.5 PPD, down from 3 PPD.  Compliant with CPAP- needs new mask.   07/12/2011 Acute OV  Presents for an acute office visit. Complains pain in right side x 3 days--more painful with cough and movement.  cough with green sputum--. She has been sick for 2 weeks, cough, congestion , drainage. Was exposed to URI symptoms at hospital . Started with lots of drippy nose and drainge.  No fever. Initially phoned in a zpack with no improvement. Then started on Omnicef 4 days ago.  Does have some wheezing . Now pain with inspiration esp along right lateral ribs. Tender to touch and with movement. No rash or fever.   08/10/11- 56 yoFwith chronic bronchitis, tobacco use, OSA, obesity hypoventilation syndrome. Several family members here.Marland Kitchen PCP Dr Lysbeth Galas She describes persistent ear ache on the right, nasal discharge from the left with little bit of bloody mucus. Distal coughing productively with light yellow sputum, no blood. Questions low-grade fever mainly because she sometimes feels chilly. Smoking is down from 3 packs a day to a little under one pack per day. I encouraged her to keep working at. She gives history of a cholesteatoma and told never to put water in her right ear.  12/10/11- 56 yoFwith chronic bronchitis, tobacco use, OSA, obesity hypoventilation syndrome.  Sister  here. Her sister Hailey Hernandez died in her sleep of end-stage COPD complicated by alpha-1 antitrypsin deficiency. We had called in Augmentin which did help some. Complains now of sinus pressure, blowing bloody scabs from her nose, postnasal drip, cough productive of thick mucus. Denies fever, swollen glands, headache. She has decided that she finally wants to quit smoking. Has reduced from 3 packs per day habit to one pack per day. We discussed support, medical reasons and available techniques and answered her questions.  05/15/12-56 yoFwith chronic bronchitis, tobacco use, OSA, obesity hypoventilation syndrome.  Sister here.  Pt states breathing has been "okay" since last seen. Pt c/o increased SOB and allergy trouble with HA . Pt denies prod cough, wheezing and chest tightness.. unhappy with Christoper Allegra and wants to change to [place]. CPAP 13/ O2 2L/ [facility] Parking requested  COPD assessment test (CAT) score 14/40 CXR 07/12/11- IMPRESSION:  Bronchitic changes. Left base atelectasis.  Original Report Authenticated By: Cyndie Chime, M.D.   Review of Systems-see HPI Constitutional:   No-   weight loss, night sweats, fevers, chills, fatigue, lassitude. HEENT:   No-  headaches, difficulty swallowing, tooth/dental problems, sore throat,       No-  sneezing, itching,  ear ache,  +nasal congestion, post nasal drip,  CV:  No-   chest pain, orthopnea, PND, swelling in lower extremities, anasarca,   dizziness, palpitations Resp: No- acute  shortness of breath with exertion or at rest.              + **Note De-Identified Dame Obfuscation** productive cough,  + non-productive cough,  No- coughing up of blood.              No-   change in color of mucus.  No- wheezing.   Skin: psoriasis GI:  No-   heartburn, indigestion, abdominal pain, nausea, vomiting,  GU: No-    MS:  No-   joint pain or swelling. . Neuro-     nothing unusual Psych:  No- change in mood or affect. No depression or anxiety.  No memory loss.   Objective:    Physical Exam General- Alert, Oriented, Affect-appropriate, Distress- none acute   sat 90% on 3L., obese Skin- extensive psoriasis,  Lymphadenopathy- none Head- atraumatic            Eyes- Gross vision intact, PERRLA, conjunctivae clear secretions            Ears- Hearing, canals- cerumen/ debris R            Nose- blood/ scabs left nostril, no-Septal dev, mucus, polyps, erosion, perforation             Throat- Mallampati II , mucosa clear , drainage- none, tonsils- atrophic. Mouth breathing Neck- flexible , trachea midline, no stridor , thyroid nl, carotid no bruit Chest - symmetrical excursion , unlabored           Heart/CV- RRR , no murmur , no gallop  , no rub, nl s1 s2                           - JVD- none , edema- none, stasis changes+ bilateral, varices- none           Lung- +decreased w/ wheezy cough , dullness-none, rub- none           Chest wall-  Abd-  Br/ Gen/ Rectal- Not done, not indicated Extrem- cyanosis- none, clubbing, none, atrophy- none, strength- nl Neuro- grossly intact to observation

## 2012-05-22 ENCOUNTER — Encounter: Payer: Self-pay | Admitting: Internal Medicine

## 2012-05-22 NOTE — Assessment & Plan Note (Signed)
**Note De-Identified Lyles Obfuscation** Remains dependent on continuous oxygen. Unfortunately she still smokes some we discussed this again.

## 2012-05-22 NOTE — Assessment & Plan Note (Signed)
**Note De-Identified Brackens Obfuscation** Despite very significant family history of tobacco related disease, including death of her sister with chronic respiratory failure, she has not made a commitment to quit smoking.

## 2012-05-28 ENCOUNTER — Telehealth: Payer: Self-pay | Admitting: Internal Medicine

## 2012-05-28 DIAGNOSIS — J449 Chronic obstructive pulmonary disease, unspecified: Secondary | ICD-10-CM

## 2012-05-28 MED ORDER — ALBUTEROL SULFATE (2.5 MG/3ML) 0.083% IN NEBU: 2.5000 mg | INHALATION_SOLUTION | RESPIRATORY_TRACT | Status: DC | PRN

## 2012-05-28 NOTE — Telephone Encounter (Signed)
**Note De-Identified Claus Obfuscation** Called spoke with patient's sister Hailey Hernandez who verified that pt is switching DME companies from [place] to [employer] and would like the order for pt's O2 2 liters, CPAP and albuterol neb soln to be ordered thru that company.  Patient last seen by Hailey Hernandez 8.15.13:  05/15/12-55 yoFwith chronic bronchitis, tobacco use, OSA, obesity hypoventilation syndrome. Sister here.  Pt states breathing has been "okay" since last seen. Pt c/o increased SOB and allergy trouble with HA . Pt denies prod cough, wheezing and chest tightness.. unhappy with Christoper Allegra and wants to change to [place].  CPAP 13/ O2 2L/ [facility]  Order for DME change placed.  Rx for albuterol neb printed for Hailey Hernandez to sign.  Britta Mccreedy is aware Hailey Hernandez will sign this when he returns to the office.  please call Britta Mccreedy when this is completed.  Thanks.

## 2012-05-29 NOTE — Telephone Encounter (Signed)
**Note De-Identified Bunner Obfuscation** Hailey Hernandez got Rx from my desk and states Britta Mccreedy is aware that RX has been taken care of.

## 2012-06-10 ENCOUNTER — Telehealth: Payer: Self-pay | Admitting: Internal Medicine

## 2012-06-10 NOTE — Telephone Encounter (Signed)
**Note De-identified Duggar Obfuscation** Done

## 2012-06-10 NOTE — Telephone Encounter (Signed)
**Note De-Identified Kuchenbecker Obfuscation** Please let Tammy know that paperwork has been signed by CY and faxed back by [place].

## 2012-06-10 NOTE — Telephone Encounter (Signed)
**Note De-Identified Broz Obfuscation** I spoke with tammy and she stated she sent over a fax to triage fax # for Dr.Young to sign RX for conservour regulator. [name] have you seen this fax. Please advise thanks

## 2012-06-10 NOTE — Telephone Encounter (Signed)
**Note De-Identified Blitch Obfuscation** Lm on Tammys VM tcb

## 2012-06-10 NOTE — Telephone Encounter (Signed)
**Note De-Identified Wind Obfuscation** Papers attached to message and on CY's cart to sign.

## 2012-06-11 ENCOUNTER — Ambulatory Visit: Payer: Medicare Other | Admitting: Cardiology

## 2012-06-11 NOTE — Telephone Encounter (Signed)
**Note De-Identified Mcgruder Obfuscation** LMOM for Tammy to be made aware that the fax was sent back on 06/10/12

## 2012-07-07 ENCOUNTER — Other Ambulatory Visit: Payer: Self-pay | Admitting: Orthopedic Surgery

## 2012-07-14 ENCOUNTER — Encounter (HOSPITAL_COMMUNITY): Payer: Self-pay | Admitting: Pharmacy Technician

## 2012-07-15 ENCOUNTER — Telehealth: Payer: Self-pay | Admitting: Internal Medicine

## 2012-07-15 ENCOUNTER — Encounter (HOSPITAL_COMMUNITY)
Admission: RE | Admit: 2012-07-15 | Discharge: 2012-07-15 | Disposition: A | Discharge status: Home / Self Care | Payer: Medicare Other | Source: Ambulatory Visit | Attending: Orthopedic Surgery | Admitting: Orthopedic Surgery

## 2012-07-15 ENCOUNTER — Encounter (HOSPITAL_COMMUNITY): Payer: Self-pay

## 2012-07-15 HISTORY — DX: Other complications of anesthesia, initial encounter: T88.59XA

## 2012-07-15 HISTORY — DX: Psoriasis, unspecified: L40.9

## 2012-07-15 LAB — CBC
MCH: 33.2 pg (ref 26.0–34.0)
MCHC: 32.8 g/dL (ref 30.0–36.0)
Platelets: 168 10*3/uL (ref 150–400)
RDW: 16.4 % — ABNORMAL HIGH (ref 11.5–15.5)

## 2012-07-15 LAB — BASIC METABOLIC PANEL
BUN: 10 mg/dL (ref 6–23)
Calcium: 9.2 mg/dL (ref 8.4–10.5)
Creatinine, Ser: 0.59 mg/dL (ref 0.50–1.10)
GFR calc non Af Amer: >90 mL/min (ref >90)
Glucose, Bld: 271 mg/dL — ABNORMAL HIGH (ref 70–99)

## 2012-07-15 NOTE — Consult Note (Addendum)
**Note De-Identified Quayle Obfuscation** Anesthesia consult: Patient is a 56 year old female posted for right shoulder arthroscopy with rotator cuff repair versus debridement, SAD, and DCR, excision shoulder mass on 07/22/12 by Dr. Ave Filter.  History includes morbid obesity, smoking, home oxygen, COPD, chronic bronchitis, OSA, obesity hypoventilation syndrome on CPAP, diverticulitis, HLD, fibromyalgia, asthma, HTN, thyroid cancer s/p partial thyroidectomy, GERD, glaucoma, arthritis, anxiety disorder.  She reports what sounds like post-operative cyanosis 20 years ago following a abdominal laparoscopic procedure that was complicated by intra-operative bleeding.  PCP is Dr. Oliver Barre.  Pulmonologist is Dr. Maple Hudson.  Dr. Ave Filter is requesting clearance, but this is still pending.    She has been evaluated by Cardiologist Dr. Antoine Poche, last visit on 05/07/12.  She has had a prior episode of syncope in 11/2010 and had an echo that showed a mildly reduced EF.  Syncope was felt related to hypotension.  After medication adjustment, no further work-up was recommended.  She has also previously reported atypical chest pain symptoms that subsided and according to her last Cardiology note, Dr. Antoine Poche did not recommend further work-up at that time.  EKG on 04/02/12 showed ST @ 100 bpm.  Echo on 12/29/10 showed: - Left ventricle: The cavity size was normal. Wall thickness was increased in a pattern of mild LVH. Systolic function was mildly reduced. The estimated ejection fraction was in the range of 45% to 50%. Regional wall motion abnormalities cannot be excluded. Doppler parameters are consistent with abnormal left ventricular relaxation (grade 1 diastolic dysfunction). - Mitral valve: Calcified annulus. Mildly thickened leaflets . Mild regurgitation. - Left atrium: The atrium was moderately dilated. Impressions: - Technically difficult due to poor sound wave transmission; LV function appears to be mildly reduced; focal wall motion abnormality cannot be  excluded; suggest TEE or MUGA if clinically indicated.  CXR on 05/15/12 showed: Stable bronchitic changes and interstitial prominence.  Lingular scarring or atelectasis. Borderline cardiomegaly.    PFT-mod restriction TLC 70%, likely obesity/hypoventilation.. No response to dilator. Probable obstruction in small airways. DLCO 43%.  FEV1 has declined from 1.24 L 07/31/00 to 1.12 L as of 01/23/10.  Labs noted.  I examined patient today.  She has occasional SOB at rest and chronic DOE.  She is able to do her own shopping.  She wears 2-3L/[place].  Over the past 3-4 days she has felt slightly more short of breath due to "allergies."  She is feeling overall better today.  She denies any recent URI or chest pain.  Lungs sounds are diminished, but otherwise clear.  Heart is regular rhythm, slightly tachycardic @ 104 bpm.   1+ pedal edema which she reports is chronic.  She feels along the continuum of her baseline.  She is feeling more at her baseline today, but since this was proceeded by a few days of worsening dyspnea, I instructed her to contact Dr. Roxy Cedar office by tomorrow for an appointment if she does not feel at her best baseline.  Dr. Maple Hudson may want to see her regardless, but his input is till pending.  She understands that if she presents with any acute respiratory symptoms on the day of surgery then her anesthesiologist may recommend postponing her surgery.   I left a voicemail with Darl Pikes at Dr. Veda Canning office regarding above.  Shonna Chock, PA-C 07/15/12 1221  Addendum: 07/17/12 1430 Dr. Jetty Duhamel provided the following input in a typed letter stating, "Ms Fasig is an active smoker against advice, with severe obstructive and restrictive lung disease, oxygen dependent on 2L/Min continuously.  She **Note De-Identified Cuoco Obfuscation** is on CPAP 13 cwp/ [facility] for sleep due to obstructive sleep apnea.  Obesity, deconditioning, and obesity/hypoventilation contribute to dyspnea with exertion.  She will be at increased risk of  cardiopulmonary complications from surgery and general anesthesia.  Necessary surgery should be done in hospital, prepared for complications including prolonged need for intubation.  Pulmonary consultation will be available as needed." I updated Anesthesiologist Dr. Gypsy Balsam.  Her assigned anesthesiologist will evaluate her on the day of surgery.

## 2012-07-15 NOTE — Pre-Procedure Instructions (Signed)
**Note De-Identified Disch Obfuscation** 20 Hailey Hernandez  07/15/2012   Your procedure is scheduled on:  07-22-2012  Report to [facility] at 5:30 AM.  Call this number if you have problems the morning of surgery: [phone]   Remember:   Do not eat food or drink:After Midnight.      Take these medicines the morning of surgery with A SIP OF WATER: inhalers as needed and directed by MD,cetirizine,lorazepam as needed,omeprezole   Do not wear jewelry, make-up or nail polish.  Do not wear lotions, powders, or perfumes. You may wear deodorant.  Do not shave 48 hours prior to surgery. Do not bring valuables to the hospital.  Contacts, dentures or bridgework may not be worn into surgery.  Leave suitcase in the car. After surgery it may be brought to your room.    For patients admitted to the hospital, checkout time is 11:00 AM the day of discharge.   Patients discharged the day of surgery will not be allowed to drive home.  Name and phone number of your driver:    Special Instructions: Incentive Spirometry - Practice and bring it with you on the day of surgery. Shower using CHG 2 nights before surgery and the night before surgery.  If you shower the day of surgery use CHG.  Use special wash - you have one bottle of CHG for all showers.  You should use approximately 1/3 of the bottle for each shower.   Please read over the following fact sheets that you were given: Pain Booklet, Coughing and Deep Breathing, MRSA Information and Surgical Site Infection Prevention

## 2012-07-15 NOTE — Telephone Encounter (Signed)
**Note De-Identified Buenger Obfuscation** Per CY---he has not seen any forms come through.  i have called and lmom for susan at [facility] to see if she can refax these forms to the fax machine up front.  Will forward to [place] to keep an eye for these coming through.

## 2012-07-15 NOTE — Telephone Encounter (Signed)
**Note De-identified Ennis Obfuscation** Please advise [name] thanks 

## 2012-07-15 NOTE — Telephone Encounter (Signed)
**Note De-Identified Landsberg Obfuscation** Darl Pikes called back and stated that she did fax this form over this am.  i have given her the triage fax number and once received i will place in CY look at.

## 2012-07-16 MED ORDER — LIDOCAINE-EPINEPHRINE 2 %-1:100000 IJ SOLN
INTRAMUSCULAR | Status: AC
  Filled 2012-07-16: qty 6.8

## 2012-07-16 MED ORDER — OXYMETAZOLINE HCL 0.05 % NA SOLN
NASAL | Status: AC
  Filled 2012-07-16: qty 15

## 2012-07-17 ENCOUNTER — Telehealth: Payer: Self-pay | Admitting: Internal Medicine

## 2012-07-17 ENCOUNTER — Encounter: Payer: Self-pay | Admitting: Internal Medicine

## 2012-07-17 NOTE — Telephone Encounter (Signed)
**Note De-Identified Mcmurry Obfuscation** All information faxed to [place] at [facility]-she is aware of fax going to her.

## 2012-07-17 NOTE — Telephone Encounter (Signed)
**Note De-Identified Kingsberry Obfuscation** Boneta Lucks with Short Stay called in regards to patient stating pt is d/t have surgery on 07/22/12 and Dr Ave Filter is requesting surgical clearance from her Pulmonary Doctor. Boneta Lucks asked if we have received anything stating a request for this? Patient does need the surgical clearance before the surgery can be performed.  Dr Maple Hudson please advise if you have received anything in regards to this. Thanks.

## 2012-07-17 NOTE — Telephone Encounter (Signed)
**Note De-identified Finks Obfuscation** Letter done

## 2012-07-17 NOTE — Telephone Encounter (Signed)
**Note De-Identified Bonello Obfuscation** All information has been faxed back as of 07-17-12 at 11am.

## 2012-07-17 NOTE — Telephone Encounter (Signed)
**Note De-Identified Hout Obfuscation** All information placed on CY's cart.

## 2012-07-17 NOTE — Progress Notes (Signed)
**Note De-Identified Schomer Obfuscation** CALLED DR Roxy Cedar OFFICE TO REQUEST CLEARANCE, PATIENT HAS NOT BEEN SEEN SINCE AUGUST. DR YOUNGS  NURSE TO CALL BACK.

## 2012-07-21 MED ORDER — DEXTROSE 5 % IV SOLN
3.0000 g | INTRAVENOUS | Status: AC
  Administered 2012-07-22: 3 g via INTRAVENOUS
  Filled 2012-07-21: qty 3000

## 2012-07-22 ENCOUNTER — Encounter (HOSPITAL_COMMUNITY): Payer: Self-pay | Admitting: Vascular Surgery

## 2012-07-22 ENCOUNTER — Encounter (HOSPITAL_COMMUNITY)
Admission: RE | Disposition: A | Discharge status: Home / Self Care | Payer: Self-pay | Source: Ambulatory Visit | Attending: Orthopedic Surgery

## 2012-07-22 ENCOUNTER — Observation Stay (HOSPITAL_COMMUNITY)
Admission: RE | Admit: 2012-07-22 | Discharge: 2012-07-23 | Disposition: A | Discharge status: Home / Self Care | Payer: Medicare Other | Source: Ambulatory Visit | Attending: Orthopedic Surgery | Admitting: Orthopedic Surgery

## 2012-07-22 ENCOUNTER — Ambulatory Visit (HOSPITAL_COMMUNITY): Payer: Medicare Other | Admitting: Vascular Surgery

## 2012-07-22 ENCOUNTER — Encounter (HOSPITAL_COMMUNITY): Payer: Self-pay | Admitting: *Deleted

## 2012-07-22 DIAGNOSIS — E669 Obesity, unspecified: Secondary | ICD-10-CM | POA: Diagnosis present

## 2012-07-22 DIAGNOSIS — Z5333 Arthroscopic surgical procedure converted to open procedure: Secondary | ICD-10-CM | POA: Insufficient documentation

## 2012-07-22 DIAGNOSIS — J449 Chronic obstructive pulmonary disease, unspecified: Secondary | ICD-10-CM | POA: Insufficient documentation

## 2012-07-22 DIAGNOSIS — M25819 Other specified joint disorders, unspecified shoulder: Secondary | ICD-10-CM | POA: Insufficient documentation

## 2012-07-22 DIAGNOSIS — G4733 Obstructive sleep apnea (adult) (pediatric): Secondary | ICD-10-CM | POA: Diagnosis present

## 2012-07-22 DIAGNOSIS — M7530 Calcific tendinitis of unspecified shoulder: Secondary | ICD-10-CM | POA: Diagnosis present

## 2012-07-22 DIAGNOSIS — I10 Essential (primary) hypertension: Secondary | ICD-10-CM | POA: Insufficient documentation

## 2012-07-22 DIAGNOSIS — M75101 Unspecified rotator cuff tear or rupture of right shoulder, not specified as traumatic: Secondary | ICD-10-CM | POA: Diagnosis present

## 2012-07-22 DIAGNOSIS — M67919 Unspecified disorder of synovium and tendon, unspecified shoulder: Principal | ICD-10-CM | POA: Insufficient documentation

## 2012-07-22 DIAGNOSIS — Z01812 Encounter for preprocedural laboratory examination: Secondary | ICD-10-CM | POA: Insufficient documentation

## 2012-07-22 DIAGNOSIS — J4489 Other specified chronic obstructive pulmonary disease: Secondary | ICD-10-CM | POA: Insufficient documentation

## 2012-07-22 DIAGNOSIS — M719 Bursopathy, unspecified: Principal | ICD-10-CM | POA: Insufficient documentation

## 2012-07-22 SURGERY — ARTHROSCOPY, SHOULDER, WITH ROTATOR CUFF REPAIR
Anesthesia: General | Site: Shoulder | Laterality: Right | Wound class: Clean

## 2012-07-22 MED ORDER — MORPHINE SULFATE 2 MG/ML IJ SOLN: 2.0000 mg | INTRAMUSCULAR | Status: DC | PRN

## 2012-07-22 MED ORDER — PROPOFOL 10 MG/ML IV BOLUS
INTRAVENOUS | Status: DC | PRN
  Administered 2012-07-22: 70 mg via INTRAVENOUS
  Administered 2012-07-22: 20 mg via INTRAVENOUS
  Administered 2012-07-22: 110 mg via INTRAVENOUS

## 2012-07-22 MED ORDER — POLYETHYLENE GLYCOL 3350 17 G PO PACK
17.0000 g | PACK | Freq: Every day | ORAL | Status: DC | PRN
  Filled 2012-07-22: qty 1

## 2012-07-22 MED ORDER — FLEET ENEMA 7-19 GM/118ML RE ENEM: 1.0000 | ENEMA | Freq: Once | RECTAL | Status: AC | PRN

## 2012-07-22 MED ORDER — METOCLOPRAMIDE HCL 5 MG PO TABS
5.0000 mg | ORAL_TABLET | Freq: Three times a day (TID) | ORAL | Status: DC | PRN
  Filled 2012-07-22: qty 2

## 2012-07-22 MED ORDER — MENTHOL 3 MG MT LOZG
1.0000 | LOZENGE | OROMUCOSAL | Status: DC | PRN
  Filled 2012-07-22: qty 9

## 2012-07-22 MED ORDER — METHOTREXATE 2.5 MG PO TABS
25.0000 mg | ORAL_TABLET | ORAL | Status: DC
  Filled 2012-07-22: qty 10

## 2012-07-22 MED ORDER — PHENOL 1.4 % MT LIQD: 1.0000 | OROMUCOSAL | Status: DC | PRN

## 2012-07-22 MED ORDER — HYDROMORPHONE HCL PF 1 MG/ML IJ SOLN: 0.2500 mg | INTRAMUSCULAR | Status: DC | PRN

## 2012-07-22 MED ORDER — KCL IN DEXTROSE-NACL 20-5-0.45 MEQ/L-%-% IV SOLN
INTRAVENOUS | Status: DC
  Administered 2012-07-22: 14:00:00 via INTRAVENOUS
  Filled 2012-07-22 (×6): qty 1000

## 2012-07-22 MED ORDER — ALUM & MAG HYDROXIDE-SIMETH 200-200-20 MG/5ML PO SUSP: 30.0000 mL | ORAL | Status: DC | PRN

## 2012-07-22 MED ORDER — FENTANYL CITRATE 0.05 MG/ML IJ SOLN
INTRAMUSCULAR | Status: DC | PRN
  Administered 2012-07-22: 50 ug via INTRAVENOUS
  Administered 2012-07-22: 100 ug via INTRAVENOUS
  Administered 2012-07-22 (×2): 50 ug via INTRAVENOUS

## 2012-07-22 MED ORDER — ARTIFICIAL TEARS OP OINT
TOPICAL_OINTMENT | OPHTHALMIC | Status: DC | PRN
  Administered 2012-07-22: 1 via OPHTHALMIC

## 2012-07-22 MED ORDER — FOLIC ACID 1 MG PO TABS
1.0000 mg | ORAL_TABLET | Freq: Every day | ORAL | Status: DC
  Administered 2012-07-22: 1 mg via ORAL
  Filled 2012-07-22 (×2): qty 1

## 2012-07-22 MED ORDER — ROCURONIUM BROMIDE 100 MG/10ML IV SOLN
INTRAVENOUS | Status: DC | PRN
  Administered 2012-07-22: 50 mg via INTRAVENOUS

## 2012-07-22 MED ORDER — ASPIRIN EC 325 MG PO TBEC
325.0000 mg | DELAYED_RELEASE_TABLET | Freq: Two times a day (BID) | ORAL | Status: DC
  Administered 2012-07-22 (×2): 325 mg via ORAL
  Filled 2012-07-22 (×2): qty 1

## 2012-07-22 MED ORDER — LORAZEPAM 0.5 MG PO TABS: 0.5000 mg | ORAL_TABLET | Freq: Three times a day (TID) | ORAL | Status: DC | PRN

## 2012-07-22 MED ORDER — METOCLOPRAMIDE HCL 5 MG/ML IJ SOLN
5.0000 mg | Freq: Three times a day (TID) | INTRAMUSCULAR | Status: DC | PRN
  Filled 2012-07-22: qty 2

## 2012-07-22 MED ORDER — LORATADINE 10 MG PO TABS
10.0000 mg | ORAL_TABLET | Freq: Every day | ORAL | Status: DC
  Administered 2012-07-22: 10 mg via ORAL
  Filled 2012-07-22 (×2): qty 1

## 2012-07-22 MED ORDER — ZOLPIDEM TARTRATE 5 MG PO TABS: 5.0000 mg | ORAL_TABLET | Freq: Every evening | ORAL | Status: DC | PRN

## 2012-07-22 MED ORDER — BUDESONIDE-FORMOTEROL FUMARATE 160-4.5 MCG/ACT IN AERO
2.0000 | INHALATION_SPRAY | Freq: Two times a day (BID) | RESPIRATORY_TRACT | Status: DC
  Administered 2012-07-22: 2 via RESPIRATORY_TRACT
  Filled 2012-07-22: qty 6

## 2012-07-22 MED ORDER — PHENYLEPHRINE HCL 10 MG/ML IJ SOLN
10.0000 mg | INTRAMUSCULAR | Status: DC | PRN
  Administered 2012-07-22: 25 ug/min via INTRAVENOUS

## 2012-07-22 MED ORDER — ONDANSETRON HCL 4 MG/2ML IJ SOLN
INTRAMUSCULAR | Status: DC | PRN
  Administered 2012-07-22: 4 mg via INTRAVENOUS

## 2012-07-22 MED ORDER — CEFAZOLIN SODIUM-DEXTROSE 2-3 GM-% IV SOLR
2.0000 g | Freq: Four times a day (QID) | INTRAVENOUS | Status: AC
  Administered 2012-07-22 – 2012-07-23 (×3): 2 g via INTRAVENOUS
  Filled 2012-07-22 (×3): qty 50

## 2012-07-22 MED ORDER — DEXTROSE 5 % IV SOLN
INTRAVENOUS | Status: DC | PRN
  Administered 2012-07-22 (×2): via INTRAVENOUS

## 2012-07-22 MED ORDER — DIPHENHYDRAMINE HCL 12.5 MG/5ML PO ELIX
12.5000 mg | ORAL_SOLUTION | ORAL | Status: DC | PRN
  Filled 2012-07-22: qty 10

## 2012-07-22 MED ORDER — ACETAMINOPHEN 650 MG RE SUPP: 650.0000 mg | Freq: Four times a day (QID) | RECTAL | Status: DC | PRN

## 2012-07-22 MED ORDER — POVIDONE-IODINE 7.5 % EX SOLN: Freq: Once | CUTANEOUS | Status: DC

## 2012-07-22 MED ORDER — SODIUM CHLORIDE 0.9 % IR SOLN
Status: DC | PRN
  Administered 2012-07-22: 6000 mL

## 2012-07-22 MED ORDER — ACETAMINOPHEN 10 MG/ML IV SOLN
INTRAVENOUS | Status: DC | PRN
  Administered 2012-07-22: 1000 mg via INTRAVENOUS

## 2012-07-22 MED ORDER — OXYCODONE-ACETAMINOPHEN 5-325 MG PO TABS: 1.0000 | ORAL_TABLET | ORAL | Status: DC | PRN

## 2012-07-22 MED ORDER — NEOSTIGMINE METHYLSULFATE 1 MG/ML IJ SOLN
INTRAMUSCULAR | Status: DC | PRN
  Administered 2012-07-22: 5 mg via INTRAVENOUS

## 2012-07-22 MED ORDER — DOCUSATE SODIUM 100 MG PO CAPS
100.0000 mg | ORAL_CAPSULE | Freq: Two times a day (BID) | ORAL | Status: DC
  Administered 2012-07-22: 100 mg via ORAL
  Filled 2012-07-22: qty 1

## 2012-07-22 MED ORDER — ONDANSETRON HCL 4 MG/2ML IJ SOLN: 4.0000 mg | Freq: Once | INTRAMUSCULAR | Status: DC | PRN

## 2012-07-22 MED ORDER — PANTOPRAZOLE SODIUM 40 MG PO TBEC: 40.0000 mg | DELAYED_RELEASE_TABLET | Freq: Every day | ORAL | Status: DC

## 2012-07-22 MED ORDER — GLYCOPYRROLATE 0.2 MG/ML IJ SOLN
INTRAMUSCULAR | Status: DC | PRN
  Administered 2012-07-22: .8 mg via INTRAVENOUS

## 2012-07-22 MED ORDER — ACETAMINOPHEN 325 MG PO TABS: 650.0000 mg | ORAL_TABLET | Freq: Four times a day (QID) | ORAL | Status: DC | PRN

## 2012-07-22 MED ORDER — ONDANSETRON HCL 4 MG PO TABS
4.0000 mg | ORAL_TABLET | Freq: Four times a day (QID) | ORAL | Status: DC | PRN
  Filled 2012-07-22: qty 1

## 2012-07-22 MED ORDER — LIDOCAINE HCL (CARDIAC) 20 MG/ML IV SOLN
INTRAVENOUS | Status: DC | PRN
  Administered 2012-07-22: 100 mg via INTRAVENOUS

## 2012-07-22 MED ORDER — HYDROCODONE-ACETAMINOPHEN 5-325 MG PO TABS
1.0000 | ORAL_TABLET | ORAL | Status: DC | PRN
  Administered 2012-07-22 – 2012-07-23 (×2): 1 via ORAL
  Filled 2012-07-22 (×2): qty 1

## 2012-07-22 MED ORDER — BISACODYL 5 MG PO TBEC: 5.0000 mg | DELAYED_RELEASE_TABLET | Freq: Every day | ORAL | Status: DC | PRN

## 2012-07-22 MED ORDER — ONDANSETRON HCL 4 MG/2ML IJ SOLN
4.0000 mg | Freq: Four times a day (QID) | INTRAMUSCULAR | Status: DC | PRN
  Filled 2012-07-22: qty 2

## 2012-07-22 MED ORDER — BUPIVACAINE-EPINEPHRINE PF 0.5-1:200000 % IJ SOLN
INTRAMUSCULAR | Status: DC | PRN
  Administered 2012-07-22: 100 mg

## 2012-07-22 MED ORDER — ACETAMINOPHEN 10 MG/ML IV SOLN
INTRAVENOUS | Status: AC
  Filled 2012-07-22: qty 100

## 2012-07-22 MED ORDER — LIDOCAINE HCL 4 % MT SOLN
OROMUCOSAL | Status: DC | PRN
  Administered 2012-07-22: 4 mL via TOPICAL

## 2012-07-22 MED ORDER — ALBUTEROL SULFATE (5 MG/ML) 0.5% IN NEBU: 2.5000 mg | INHALATION_SOLUTION | RESPIRATORY_TRACT | Status: DC | PRN

## 2012-07-22 MED ORDER — ATORVASTATIN CALCIUM 20 MG PO TABS
20.0000 mg | ORAL_TABLET | Freq: Every day | ORAL | Status: DC
  Administered 2012-07-22: 20 mg via ORAL
  Filled 2012-07-22 (×2): qty 1

## 2012-07-22 MED ORDER — LACTATED RINGERS IV SOLN
INTRAVENOUS | Status: DC | PRN
  Administered 2012-07-22 (×2): via INTRAVENOUS

## 2012-07-22 SURGICAL SUPPLY — 67 items
BLADE LONG MED 31X9 (MISCELLANEOUS) IMPLANT
BLADE SURG 11 STRL SS (BLADE) ×2 IMPLANT
BUR OVAL 4.0 (BURR) ×2 IMPLANT
CANISTER OMNI JUG 16 LITER (MISCELLANEOUS) ×2 IMPLANT
CANNULA 5.75X71 LONG (CANNULA) ×2 IMPLANT
CHLORAPREP W/TINT 26ML (MISCELLANEOUS) ×2 IMPLANT
CLOTH BEACON ORANGE TIMEOUT ST (SAFETY) ×2 IMPLANT
COVER SURGICAL LIGHT HANDLE (MISCELLANEOUS) ×2 IMPLANT
DRAPE INCISE IOBAN 66X45 STRL (DRAPES) ×2 IMPLANT
DRAPE STERI 35X30 U-POUCH (DRAPES) ×2 IMPLANT
DRAPE U-SHAPE 47X51 STRL (DRAPES) ×2 IMPLANT
DRILL BIT PANALOK RC 3.2 (BIT) IMPLANT
DRSG EMULSION OIL 3X3 NADH (GAUZE/BANDAGES/DRESSINGS) ×2 IMPLANT
DRSG PAD ABDOMINAL 8X10 ST (GAUZE/BANDAGES/DRESSINGS) ×4 IMPLANT
ELECT CAUTERY BLADE 6.4 (BLADE) IMPLANT
ELECT NDL TIP 2.8 STRL (NEEDLE) IMPLANT
ELECT NEEDLE TIP 2.8 STRL (NEEDLE) IMPLANT
ELECT REM PT RETURN 9FT ADLT (ELECTROSURGICAL) ×2
ELECTRODE REM PT RTRN 9FT ADLT (ELECTROSURGICAL) IMPLANT
GAUZE XEROFORM 1X8 LF (GAUZE/BANDAGES/DRESSINGS) ×2 IMPLANT
GLOVE BIO SURGEON STRL SZ7 (GLOVE) ×2 IMPLANT
GLOVE BIO SURGEON STRL SZ7.5 (GLOVE) ×2 IMPLANT
GLOVE BIOGEL PI IND STRL 8 (GLOVE) ×1 IMPLANT
GLOVE BIOGEL PI INDICATOR 8 (GLOVE) ×1
GOWN PREVENTION PLUS LG XLONG (DISPOSABLE) ×2 IMPLANT
GOWN STRL NON-REIN LRG LVL3 (GOWN DISPOSABLE) ×4 IMPLANT
KIT BASIN OR (CUSTOM PROCEDURE TRAY) ×2 IMPLANT
KIT ROOM TURNOVER OR (KITS) ×2 IMPLANT
MANIFOLD NEPTUNE II (INSTRUMENTS) ×2 IMPLANT
NDL HYPO 25GX1X1/2 BEV (NEEDLE) ×1 IMPLANT
NDL SPNL 18GX3.5 QUINCKE PK (NEEDLE) ×1 IMPLANT
NDL SUT 6 .5 CRC .975X.05 MAYO (NEEDLE) IMPLANT
NEEDLE HYPO 25GX1X1/2 BEV (NEEDLE) ×2 IMPLANT
NEEDLE MAYO TAPER (NEEDLE)
NEEDLE SPNL 18GX3.5 QUINCKE PK (NEEDLE) ×2 IMPLANT
NS IRRIG 1000ML POUR BTL (IV SOLUTION) ×2 IMPLANT
PACK SHOULDER (CUSTOM PROCEDURE TRAY) ×2 IMPLANT
PAD ARMBOARD 7.5X6 YLW CONV (MISCELLANEOUS) ×4 IMPLANT
RESECTOR FULL RADIUS 4.2MM (BLADE) ×2 IMPLANT
SET ARTHROSCOPY TUBING (MISCELLANEOUS) ×2
SET ARTHROSCOPY TUBING LN (MISCELLANEOUS) ×1 IMPLANT
SLING ARM FOAM STRAP LRG (SOFTGOODS) ×1 IMPLANT
SLING ARM FOAM STRAP MED (SOFTGOODS) IMPLANT
SLING ARM FOAM STRAP XLG (SOFTGOODS) ×1 IMPLANT
SPONGE GAUZE 4X4 12PLY (GAUZE/BANDAGES/DRESSINGS) ×2 IMPLANT
SPONGE LAP 4X18 X RAY DECT (DISPOSABLE) IMPLANT
SUCTION FRAZIER TIP 10 FR DISP (SUCTIONS) IMPLANT
SUPPORT WRAP ARM LG (MISCELLANEOUS) ×2 IMPLANT
SUT BONE WAX W31G (SUTURE) IMPLANT
SUT ETHIBOND 2 OS 4 DA (SUTURE) IMPLANT
SUT ETHIBOND NAB BRD #0 18IN (SUTURE) IMPLANT
SUT ETHILON 3 0 PS 1 (SUTURE) ×1 IMPLANT
SUT FIBERWIRE #2 38 T-5 BLUE (SUTURE)
SUT PDS AB 1 CT  36 (SUTURE) ×1
SUT PDS AB 1 CT 36 (SUTURE) IMPLANT
SUT VIC AB 0 CT2 27 (SUTURE) IMPLANT
SUT VIC AB 2-0 CT1 27 (SUTURE)
SUT VIC AB 2-0 CT1 TAPERPNT 27 (SUTURE) IMPLANT
SUT VIC AB 2-0 FS1 27 (SUTURE) IMPLANT
SUTURE FIBERWR #2 38 T-5 BLUE (SUTURE) IMPLANT
SYR CONTROL 10ML LL (SYRINGE) ×2 IMPLANT
TAPE CLOTH SURG 6X10 WHT LF (GAUZE/BANDAGES/DRESSINGS) ×1 IMPLANT
TOWEL OR 17X24 6PK STRL BLUE (TOWEL DISPOSABLE) ×2 IMPLANT
TOWEL OR 17X26 10 PK STRL BLUE (TOWEL DISPOSABLE) ×2 IMPLANT
TUBE CONNECTING 12X1/4 (SUCTIONS) ×2 IMPLANT
WAND 90 DEG TURBOVAC W/CORD (SURGICAL WAND) ×2 IMPLANT
WATER STERILE IRR 1000ML POUR (IV SOLUTION) ×2 IMPLANT

## 2012-07-22 NOTE — Progress Notes (Signed)
**Note De-Identified Weekly Obfuscation** Placed pt. On CPAP Neer Full Face Mask, 13.0 cm H20 per home settings with 5 lpm O2 bleed in.  HR 99,  RR 19, O2 Sat 92%. RN aware. Pt. Tolerating well at this time.

## 2012-07-22 NOTE — Anesthesia Preprocedure Evaluation (Addendum)
**Note De-Identified Schreck Obfuscation** Anesthesia Evaluation  Patient identified by MRN, date of birth, ID band Patient awake    Reviewed: Allergy & Precautions, H&P , NPO status , Patient's Chart, lab work & pertinent test results  Airway Mallampati: III TM Distance: >3 FB Neck ROM: full    Dental  (+) Edentulous Upper and Edentulous Lower   Pulmonary shortness of breath, asthma , sleep apnea, Continuous Positive Airway Pressure Ventilation and Oxygen sleep apnea , COPD COPD inhaler and oxygen dependent, Current Smoker,    + decreased breath sounds      Cardiovascular Exercise Tolerance: Poor hypertension, + dysrhythmias Rhythm:regular Rate:Tachycardia  -------------------------------------------------------------------- Study Conclusions  - Left ventricle: The cavity size was normal. Wall thickness was   increased in a pattern of mild LVH. Systolic function was mildly   reduced. The estimated ejection fraction was in the range of 45%   to 50%. Regional wall motion abnormalities cannot be excluded.   Doppler parameters are consistent with abnormal left ventricular   relaxation (grade 1 diastolic dysfunction). - Mitral valve: Calcified annulus. Mildly thickened leaflets . Mild   regurgitation. - Left atrium: The atrium was moderately dilated. Impressions:     Neuro/Psych PSYCHIATRIC DISORDERS Anxiety Depression  Neuromuscular disease    GI/Hepatic Neg liver ROS, GERD-  Medicated and Controlled,  Endo/Other    Renal/GU negative Renal ROS     Musculoskeletal  (+) Fibromyalgia -  Abdominal   Peds  Hematology negative hematology ROS (+)   Anesthesia Other Findings Extreme Psoriasis over extremities  Reproductive/Obstetrics                        Anesthesia Physical Anesthesia Plan  ASA: III  Anesthesia Plan: General and Regional   Post-op Pain Management:    Induction: Intravenous  Airway Management Planned: Oral  ETT  Additional Equipment:   Intra-op Plan:   Post-operative Plan: Extubation in OR  Informed Consent: I have reviewed the patients History and Physical, chart, labs and discussed the procedure including the risks, benefits and alternatives for the proposed anesthesia with the patient or authorized representative who has indicated his/her understanding and acceptance.   Dental advisory given  Plan Discussed with: CRNA, Anesthesiologist and Surgeon  Anesthesia Plan Comments:        Anesthesia Quick Evaluation

## 2012-07-22 NOTE — H&P (Signed)
**Note De-Identified Betzold Obfuscation** Hailey Hernandez is an 56 y.o. female.   Chief Complaint: R shoulder pain and dysfunction HPI: History of R shoulder pain with RCT and chronic impingement, ac joint DJD and calcific deposit, failed nonoperative treatment.  Past Medical History  Diagnosis Date  . Tachyarrhythmia   . Tobacco abuse   . Chronic bronchitis   . History of thyroid cancer   . Rhinitis   . Polymyalgia   . Diverticulitis   . DJD (degenerative joint disease)     spine  . Arthritis   . COPD (chronic obstructive pulmonary disease)   . Hyperlipidemia   . GERD (gastroesophageal reflux disease)   . Fibromyalgia   . Arthritis     bil legs  . Asthma   . Glaucoma   . Allergy   . Anxiety disorder   . Depression   . Cancer   . Complication of anesthesia     20 yrs ago turned blue after surgery  . HTN (hypertension)     no longer on BP medication  . Shortness of breath   . OSA (obstructive sleep apnea)     CPAP  last sleep study 8-10 yr.ag0  . Psoriasis     Past Surgical History  Procedure Date  . Total abdominal hysterectomy   . Cholecystectomy   . Appendectomy   . Carpal tunnel release   . Left shoulder spurs   . Orif right lower leg   . Cholesteatoma excision     right ear  . Carpal tunnel release 10/30/2011    Procedure: CARPAL TUNNEL RELEASE;  Surgeon: Nicki Reaper, MD;  Location: [facility];  Service: Orthopedics;  Laterality: Right;  and mass excision  . Partial throidectomy     Family History  Problem Relation Age of Onset  . Emphysema Sister   . Emphysema Sister   . Alpha-1 antitrypsin deficiency Sister   . Alpha-1 antitrypsin deficiency Sister   . Allergies Sister   . Asthma Sister   . Heart disease Mother   . Heart disease Father   . Cancer Mother   . Cancer Sister   . Cancer Brother   . Tuberculosis Other     grandmother   Social History:  reports that she has been smoking Cigarettes.  She has a 67.5 pack-year smoking history. She has never used smokeless  tobacco. She reports that she does not drink alcohol or use illicit drugs.  Allergies:  Allergies  Allergen Reactions  . Milnacipran     REACTION: dizzy  . Tramadol     Did not work    Medications Prior to Admission  Medication Sig Dispense Refill  . albuterol (PROVENTIL) (2.5 MG/3ML) 0.083% nebulizer solution Take 3 mLs (2.5 mg total) by nebulization every 4 (four) hours as needed for wheezing or shortness of breath. DX: 496  180 vial  prn  . atorvastatin (LIPITOR) 20 MG tablet Take 1 tablet (20 mg total) by mouth daily.  90 tablet  3  . budesonide-formoterol (SYMBICORT) 160-4.5 MCG/ACT inhaler Inhale 2 puffs into the lungs daily.       . cetirizine (ZYRTEC) 10 MG tablet Take 10 mg by mouth daily.      . folic acid (FOLVITE) 1 MG tablet Take 1 mg by mouth daily.       Marland Kitchen ibuprofen (ADVIL,MOTRIN) 200 MG tablet Take 200-400 mg by mouth every 6 (six) hours as needed. For pain      . LORazepam (ATIVAN) 0.5 MG tablet Take 0.5 **Note De-Identified Stitt Obfuscation** mg by mouth every 8 (eight) hours as needed. For anxiety      . Nebulizers (COMPRESSOR/NEBULIZER) MISC Use up to 4 times daily when needed  1 each  0  . omeprazole (PRILOSEC) 20 MG capsule Take 20 mg by mouth daily.       . methotrexate (RHEUMATREX) 2.5 MG tablet 25 mg once a week. On wednesday Caution:Chemotherapy. Protect from light.        No results found for this or any previous visit (from the past 48 hour(s)). No results found.  Review of Systems  Respiratory: Positive for cough and shortness of breath.   All other systems reviewed and are negative.    Blood pressure 135/74, pulse 99, temperature 97.9 F (36.6 C), temperature source Oral, resp. rate 18, SpO2 93.00%. Physical Exam  Constitutional: She is oriented to person, place, and time. She appears well-nourished.  HENT:  Head: Atraumatic.  Eyes: EOM are normal.  Cardiovascular: Intact distal pulses.   Respiratory: No respiratory distress.  Musculoskeletal:       Right shoulder: She exhibits  decreased range of motion, tenderness and pain.  Neurological: She is alert and oriented to person, place, and time.  Skin: Skin is warm and dry. Rash noted.  Psychiatric: She has a normal mood and affect.     Assessment/Plan History of R shoulder pain with RCT and chronic impingement, ac joint DJD and calcific deposit, failed nonoperative treatment. Plan R arthroscopic SAD/DCR, RCR vs RC debridement, open excision calcific mass Risks / benefits of surgery discussed Consent on chart  NPO for OR Preop antibiotics Likely will need to stay overnight for obs.  Mable Paris 07/22/2012, 7:22 AM

## 2012-07-22 NOTE — Preoperative (Signed)
**Note De-identified Barbara Obfuscation** Beta Blockers   Reason not to administer Beta Blockers:Not Applicable 

## 2012-07-22 NOTE — Transfer of Care (Signed)
**Note De-Identified Shipton Obfuscation** Immediate Anesthesia Transfer of Care Note  Patient: Hailey Hernandez  Procedure(s) Performed: Procedure(s) (LRB) with comments: SHOULDER ARTHROSCOPY WITH ROTATOR CUFF REPAIR (Right) - RIGHT SHOULDER ARTHROSCOPY  WITH ROTATOR CUFF DEBRIDEMENT, Subacromial decompression, Distal Clavicle Resection, Open EXCISION SHOULDER MASS  Patient Location: PACU  Anesthesia Type: General  Level of Consciousness: awake, alert , oriented and patient cooperative  Airway & Oxygen Therapy: Patient connected to face mask  Post-op Assessment: Report given to PACU RN and Post -op Vital signs reviewed and stable  Post vital signs: Reviewed and stable  Complications: No apparent anesthesia complications

## 2012-07-22 NOTE — Progress Notes (Signed)
**Note De-Identified Brenning Obfuscation** Pt set up with CPAP as ordered. Placed CPAP in pt room set up with full face mask as worn at home with 13 cmH20. Pt is not wearing at this time. Will call when ready to be placed on CPAP.

## 2012-07-22 NOTE — Op Note (Signed)
**Note De-Identified Tudisco Obfuscation** Procedure(s): SHOULDER ARTHROSCOPY WITH ROTATOR CUFF REPAIR Procedure Note  Hailey Hernandez female 56 y.o. 07/22/2012  Procedure(s) :    #1 right shoulder arthroscopic debridement of partial-thickness rotator cuff tear and superior labral tear    #2 right shoulder arthroscopic subacromial decompression   #3 right shoulder arthroscopic distal clavicle excision    #4 right shoulder open excision deep mass   Surgeon(s) and Role:    * Mable Paris, MD - Primary     Surgeon: Mable Paris   Assistants: Hailey Lack PA-C [facility] was present and scrubbed throughout the procedure and was essential in positioning, retraction, exposure, and closure)  Anesthesia: General endotracheal anesthesia with preoperative interscalene block     Procedure Detail  Estimated Blood Loss: Min         Drains: none  Blood Given: none         Specimens: none        Complications:  * No complications entered in OR log *         Disposition: PACU - hemodynamically stable.         Condition: stable    Procedure:   INDICATIONS FOR SURGERY: The patient is 56 y.o. female who has had a long history of right shoulder pain which was refractory to nonoperative treatment. She had symptoms consistent with impingement/rotator cuff tear, a.c. joint DJD, and was noted to have a large calcific mass anterior to the coracoid on imaging. She failed conservative treatment with rest, injections, medications, and wished to go forward with surgical management. He has significant medical comorbidities understood the potential risks of surgery. She did have preoperative medical consult and was told she was high risk but want to go forward with surgery understanding the potential risks.  OPERATIVE FINDINGS: Examination under anesthesia: No laxity or instability. Diagnostic Arthroscopy:  Glenoid articular cartilage: Intact Humeral head articular cartilage: Intact Labrum: Superior labral  extensive fraying with no significant detachment of the biceps root. The biceps tendon had some mild synovitis but no tearing. Loose bodies: None Synovitis: Moderate Articular sided rotator cuff: Partial-thickness tearing, involving less than 50% of the thickness of the tendon, debrided. Bursal sided rotator cuff: Some anterior fraying but no significant tearing. Coracoacromial ligament: Significant fraying indicating chronic impingement, underlying large hooked anterior acromial spur.  DESCRIPTION OF PROCEDURE: The patient was identified in preoperative  holding area where I personally marked the operative site after  verifying site, side, and procedure with the patient. An interscalene block was given by the attending anesthesiologist the holding area.  The patient was taken back to the operating room where general anesthesia was induced without complication and was placed in the beach-chair position with the back  elevated about 60 degrees and all extremities and head and neck carefully padded and  positioned.   The right upper extremity was then prepped and  draped in a standard sterile fashion. The appropriate time-out  procedure was carried out. The patient did receive IV antibiotics  within 30 minutes of incision.   A small posterior portal incision was made and the arthroscope was introduced into the joint. An anterior portal was then established above the subscapularis using needle localization. Small cannula was placed anteriorly. Diagnostic arthroscopy was then carried out with findings as described above.  The shaver was used through the anterior portal to debride the partial tearing of the superior labrum back to stable surface. The biceps tendon was pulled in the joint was noted to be mildly synovitic but not **Note De-Identified Koy Obfuscation** torn. Examination of the subscapularis anteriorly noted some partial-thickness tearing but no detachment. This was debrided. The undersurface of the supraspinatus was also  noted to be thinned and partially torn but no significant exposure of the tuberosity. This was debrided. It was then tagged with a 0 PDS suture percutaneously to examine the bursal surface. The remainder the joint was examined as described above.  The arthroscope was then introduced into the subacromial space a standard lateral portal was established with needle localization. The shaver was used through the lateral portal to perform extensive bursectomy. Coracoacromial ligament was examined and found to be severely frayed. After extensive bursa was removed the Russell surface of the rotator cuff was carefully examined and probed. There is some mild thinning and fraying anteriorly but no significant tearing and I did not feel that the amount of thinning was appropriate for a takedown and repair. This was lightly debrided. The PDS suture was removed..  The coracoacromial ligament was taken down off the anterior acromion with the ArthroCare exposing a large hooked anterior acromial spur. A high-speed bur was then used through the lateral portal to take down the anterior acromial spur from lateral to medial in a standard acromioplasty.  The acromioplasty was also viewed from the lateral portal and the bur was used as necessary to ensure that the acromion was completely flat from posterior to anterior.  The distal clavicle was exposed arthroscopically and the bur was used to take off the undersurface for approximately 8 mm from the lateral portal. The bur was then moved to an anterior portal position to complete the distal clavicle excision resecting about 8 mm of the distal clavicle and a smooth even fashion. This was viewed from anterior and lateral portals and felt to be complete.  The arthroscopic equipment was removed from the joint and attention was then turned to the anterior shoulder where A. approximately 6 cm incision was made over the coracoid. The deltopectoral interval was identified and opened. Deep  retractors were placed beneath the deltoid laterally and pectoralis major medially. Anterior coracoid was palpated and visualized. Just medial to the tip of the coracoid I felt a mass. I was able to get into the mass using dissection scissors and this exposed a whitish chalky mass with consistency similar to toothpaste. Using a Therapist, nutritional and dissection scissors I removed several cc of this chalky material and sent for pathology. I carefully probed the area and felt that after removing a significant amount that there was none remaining. The area was copiously irrigated and a Frazier tip suction was used to debride the inside of the sac to make sure there was no deposits remaining. The wound was then closed in layers with 0 Vicryl in deep interval layer, 2-0 Vicryl for skin in a deep dermal layer and 4-0 Monocryl for the superficial skin closure. Sterile dressings were then applied including Steri-Strips, Xeroform 4 x 4's ABDs and tape. The patient was then allowed to awaken from general anesthesia, placed in a sling, transferred to the stretcher and taken to the recovery room in stable condition.   POSTOPERATIVE PLAN: The patient will be kept in the hospital overnight for observation given her severe COPD and sleep apnea. She will likely be discharged home tomorrow with her family. She'll then followup in one to 2 weeks. Marland Kitchen

## 2012-07-22 NOTE — Anesthesia Postprocedure Evaluation (Signed)
**Note De-identified Gazda Obfuscation**  **Note De-Identified Edds Obfuscation** Anesthesia Post-op Note  Patient: Hailey Hernandez  Procedure(s) Performed: Procedure(s) (LRB) with comments: SHOULDER ARTHROSCOPY WITH ROTATOR CUFF REPAIR (Right) - RIGHT SHOULDER ARTHROSCOPY  WITH ROTATOR CUFF DEBRIDEMENT, Subacromial decompression, Distal Clavicle Resection, Open EXCISION SHOULDER MASS  Patient Location: PACU  Anesthesia Type: GA combined with regional for post-op pain  Level of Consciousness: awake, alert , oriented and patient cooperative  Airway and Oxygen Therapy: Patient Spontanous Breathing and Patient connected to face mask oxygen  Post-op Pain: none  Post-op Assessment: Post-op Vital signs reviewed, Patient's Cardiovascular Status Stable, Respiratory Function Stable, Patent Airway, No signs of Nausea or vomiting and Pain level controlled  Post-op Vital Signs: stable  Complications: No apparent anesthesia complications

## 2012-07-22 NOTE — Anesthesia Procedure Notes (Addendum)
**Note De-Identified Waggoner Obfuscation** Anesthesia Regional Block:  Interscalene brachial plexus block  Pre-Anesthetic Checklist: ,, timeout performed, Correct Patient, Correct Site, Correct Laterality, Correct Procedure, Correct Position, site marked, Risks and benefits discussed,  Surgical consent,  Pre-op evaluation,  At surgeon's request and post-op pain management  Laterality: Right  Prep: Maximum Sterile Barrier Precautions used, chloraprep and alcohol swabs       Needles:  Injection technique: Single-shot  Needle Type: Stimulator Needle - 40        Needle insertion depth: 4 cm   Additional Needles:  Procedures: nerve stimulator Interscalene brachial plexus block  Nerve Stimulator or Paresthesia:  Response: 0.5 mA, 0.1 ms, 4 cm  Additional Responses:   Narrative:  Start time: 07/22/2012 7:05 AM End time: 07/22/2012 7:10 AM Injection made incrementally with aspirations every 5 mL.  Performed by: Personally  Anesthesiologist: Maren Beach MD  Additional Notes: Pt accepts procedure and risks. Skin local 2% Lidocaine. 20 cc 0.2 % Marcaine w/ epi w/o difficulty or discomfort. Pt tolerated well. GES   Procedure Name: Intubation Date/Time: 07/22/2012 7:42 AM Performed by: Tyrone Nine Pre-anesthesia Checklist: Patient identified, Timeout performed, Emergency Drugs available, Suction available and Patient being monitored Patient Re-evaluated:Patient Re-evaluated prior to inductionOxygen Delivery Method: Circle system utilized Preoxygenation: Pre-oxygenation with 100% oxygen Intubation Type: IV induction Ventilation: Mask ventilation without difficulty Tube size: 7.5 mm Number of attempts: 1 Airway Equipment and Method: Stylet Placement Confirmation: ETT inserted through vocal cords under direct vision,  breath sounds checked- equal and bilateral,  positive ETCO2 and CO2 detector Secured at: 23 cm Tube secured with: Tape Dental Injury: Teeth and Oropharynx as per pre-operative assessment

## 2012-07-23 DIAGNOSIS — E669 Obesity, unspecified: Secondary | ICD-10-CM | POA: Diagnosis present

## 2012-07-23 DIAGNOSIS — M7530 Calcific tendinitis of unspecified shoulder: Secondary | ICD-10-CM | POA: Diagnosis present

## 2012-07-23 DIAGNOSIS — M75101 Unspecified rotator cuff tear or rupture of right shoulder, not specified as traumatic: Secondary | ICD-10-CM | POA: Diagnosis present

## 2012-07-23 LAB — CBC
MCH: 33.1 pg (ref 26.0–34.0)
MCV: 102.9 fL — ABNORMAL HIGH (ref 78.0–100.0)
Platelets: 174 10*3/uL (ref 150–400)
RDW: 16 % — ABNORMAL HIGH (ref 11.5–15.5)

## 2012-07-23 LAB — BASIC METABOLIC PANEL
BUN: 10 mg/dL (ref 6–23)
Calcium: 9.2 mg/dL (ref 8.4–10.5)
Creatinine, Ser: 0.57 mg/dL (ref 0.50–1.10)
GFR calc Af Amer: >90 mL/min (ref >90)

## 2012-07-23 MED ORDER — OXYCODONE-ACETAMINOPHEN 5-325 MG PO TABS: 1.0000 | ORAL_TABLET | ORAL | Status: DC | PRN

## 2012-07-23 NOTE — Progress Notes (Signed)
**Note De-Identified Dorado Obfuscation** Discharge instructions given to pt and spouse.  Both verbalized understanding with all questions answered.  Pt's O2 sats 93-94% on home O2 level- 3L.   Pt discharged to home with spouse with home O2  Roselie Awkward, RN

## 2012-07-23 NOTE — Progress Notes (Signed)
**Note De-Identified Aquilino Obfuscation** PATIENT ID: Wynona Canes C Munley   1 Day Post-Op #1 right shoulder arthroscopic debridement of partial-thickness rotator cuff tear and superior labral tear  #2 right shoulder arthroscopic subacromial decompression  #3 right shoulder arthroscopic distal clavicle excision  #4 right shoulder open excision deep mass   Subjective: Doing very well. Reports that she slept well with CPAP machine and head of bed elevated. Pain is minimal and well controlled. Denies shortness of breath, light headedness, dizziness.  Objective:  Filed Vitals:   07/23/12 0336  BP: 122/60  Pulse: 75  Temp: 97.4 F (36.3 C)  Resp: 18     R UE dressings clean, dry and intact Wiggles fingers, intact sensation to light touch.  Mild swelling of R UE  Labs:   Basename 07/23/12 0505  HGB 14.7   Basename 07/23/12 0505  WBC 14.6*  RBC 4.44  HCT 45.7  PLT 174   Basename 07/23/12 0505  NA 143  K 4.1  CL 101  CO2 33*  BUN 10  CREATININE 0.57  GLUCOSE 125*  CALCIUM 9.2    Assessment and Plan: Did very well over night with CPAP pain is minimal Okay to d/c home today with percocet for pain control F/u 7-10 days with Dr. Ave Filter  VTE proph: SCDs. ASA 325mg  BID

## 2012-07-23 NOTE — Discharge Summary (Signed)
**Note De-Identified Knopf Obfuscation** Patient ID: Hailey Hernandez MRN: [id number] DOB/AGE: [birth year] 56 y.o.  Admit date: 07/22/2012 Discharge date: 07/23/2012  Admission Diagnoses:  Principal Problem:  *Rotator cuff syndrome of right shoulder Active Problems:  OBSTRUCTIVE SLEEP APNEA  Obesity  Tendonitis, calcific, shoulder   Discharge Diagnoses:  Same  Past Medical History  Diagnosis Date  . Tachyarrhythmia   . Tobacco abuse   . Chronic bronchitis   . History of thyroid cancer   . Rhinitis   . Polymyalgia   . Diverticulitis   . DJD (degenerative joint disease)     spine  . Arthritis   . COPD (chronic obstructive pulmonary disease)   . Hyperlipidemia   . GERD (gastroesophageal reflux disease)   . Fibromyalgia   . Arthritis     bil legs  . Asthma   . Glaucoma   . Allergy   . Anxiety disorder   . Depression   . Cancer   . Complication of anesthesia     20 yrs ago turned blue after surgery  . HTN (hypertension)     no longer on BP medication  . Shortness of breath   . OSA (obstructive sleep apnea)     CPAP  last sleep study 8-10 yr.ag0  . Psoriasis     Surgeries: Procedure(s): SHOULDER ARTHROSCOPY WITH ROTATOR CUFF REPAIR on 07/22/2012   Consultants:    Discharged Condition: Improved  Hospital Course: Hailey Hernandez is an 56 y.o. female who was admitted 07/22/2012 for operative treatment ofRotator cuff syndrome of right shoulder, excision of deep mass. Patient has severe unremitting pain that affects sleep, daily activities, and work/hobbies. After pre-op clearance the patient was taken to the operating room on 07/22/2012 and underwent  Procedure(s): Procedure(s) :  #1 right shoulder arthroscopic debridement of partial-thickness rotator cuff tear and superior labral tear  #2 right shoulder arthroscopic subacromial decompression  #3 right shoulder arthroscopic distal clavicle excision  #4 right shoulder open excision deep mass   Patient was given perioperative antibiotics: Anti-infectives      Start     Dose/Rate Route Frequency Ordered Stop   07/22/12 1430   ceFAZolin (ANCEF) IVPB 2 g/50 mL premix        2 g 100 mL/hr over 30 Minutes Intravenous Every 6 hours 07/22/12 1301 07/23/12 0344   07/21/12 1451   ceFAZolin (ANCEF) 3 g in dextrose 5 % 50 mL IVPB        3 g 160 mL/hr over 30 Minutes Intravenous 60 min pre-op 07/21/12 1451 07/22/12 0734           Patient was given sequential compression devices, early ambulation, and ASA325mg  BID to prevent DVT.  Patient benefited maximally from hospital stay and there were no complications. Respiratory therapy consulted to manage overnight CPAP machine with no complications.    Recent vital signs: Patient Vitals for the past 24 hrs:  BP Temp Temp src Pulse Resp SpO2 Height Weight  07/23/12 0726 - 97.8 F (36.6 C) Oral - - - - -  07/23/12 0336 122/60 mmHg 97.4 F (36.3 C) Oral 75  18  91 % - -  07/23/12 0000 111/60 mmHg 98 F (36.7 C) Oral 104  28  91 % - -  07/22/12 2025 - 99 F (37.2 C) - 99  19  92 % - -  07/22/12 2016 118/62 mmHg 98.1 F (36.7 C) Oral 97  21  92 % - -  07/22/12 1800 128/63 mmHg - - 112  25  90 % - - **Note De-Identified Umble Obfuscation** 07/22/12 1700 119/67 mmHg - - 119  24  88 % - -  07/22/12 1600 100/67 mmHg 98.4 F (36.9 C) Oral 105  24  91 % - -  07/22/12 1500 130/71 mmHg - - 102  22  89 % - -  07/22/12 1415 108/68 mmHg - - 93  21  94 % - -  07/22/12 1400 120/77 mmHg - - 98  20  92 % - -  07/22/12 1345 130/58 mmHg - - 104  23  92 % - -  07/22/12 1330 119/62 mmHg - - 102  24  91 % - -  07/22/12 1315 130/60 mmHg - - 104  26  91 % - -  07/22/12 1309 - - - 105  25  90 % - -  07/22/12 1300 134/65 mmHg - - 106  25  90 % - -  07/22/12 1245 149/82 mmHg - - 106  23  92 % - -  07/22/12 1237 126/64 mmHg 97.6 F (36.4 C) Oral 103  23  91 % 5\' 5"  (1.651 m) 126.7 kg (279 lb 5.2 oz)  07/22/12 1215 - 97.2 F (36.2 C) - - - - - -  07/22/12 1209 - - - 106  27  91 % - -  07/22/12 1208 - - - 105  27  91 % - -  07/22/12 1207 - - - 107  24  92 % -  -  07/22/12 1206 - - - 108  23  92 % - -  07/22/12 1205 - - - 106  23  91 % - -  07/22/12 1204 - - - 104  28  91 % - -  07/22/12 1203 - - - 107  25  91 % - -  07/22/12 1202 - - - 107  25  91 % - -  07/22/12 1201 - - - 104  34  90 % - -  07/22/12 1200 - - - 104  30  91 % - -  07/22/12 1159 - - - 102  25  91 % - -  07/22/12 1158 - - - 108  25  92 % - -  07/22/12 1157 - - - 104  27  92 % - -  07/22/12 1156 - - - 106  22  92 % - -  07/22/12 1155 - - - 105  24  92 % - -  07/22/12 1154 - - - 106  25  91 % - -  07/22/12 1153 - - - 106  24  91 % - -  07/22/12 1152 - - - 107  25  92 % - -  07/22/12 1151 - - - 107  21  90 % - -  07/22/12 1150 - 97 F (36.1 C) - 103  23  90 % - -  07/22/12 1149 - - - 102  25  91 % - -  07/22/12 1148 - - - 106  25  91 % - -  07/22/12 1147 - - - 105  24  91 % - -  07/22/12 1146 - - - 107  24  92 % - -  07/22/12 1145 - - - 104  21  90 % - -  07/22/12 1144 - - - 105  24  91 % - -  07/22/12 1143 - - - 105  23  92 % - -  07/22/12 1142 137/72 mmHg - - 105  24  91 % - - **Note De-Identified Floresca Obfuscation** 07/22/12 1141 - - - 106  21  90 % - -  07/22/12 1140 - - - 104  23  90 % - -  07/22/12 1139 - - - 105  24  91 % - -  07/22/12 1138 - - - 106  24  91 % - -  07/22/12 1137 - - - 107  28  91 % - -  07/22/12 1136 - - - 104  26  91 % - -  07/22/12 1135 - - - 107  21  91 % - -  07/22/12 1134 - - - 106  23  92 % - -  07/22/12 1133 - - - 105  23  91 % - -  07/22/12 1132 - - - 107  23  91 % - -  07/22/12 1131 - - - 104  24  92 % - -  07/22/12 1130 - - - 109  24  91 % - -  07/22/12 1129 - - - 104  24  91 % - -  07/22/12 1128 - - - 109  24  89 % - -  07/22/12 1127 147/75 mmHg - - 107  25  89 % - -  07/22/12 1126 - - - 105  24  90 % - -  07/22/12 1125 - - - 108  23  90 % - -  07/22/12 1124 - - - 105  24  90 % - -  07/22/12 1123 - - - 106  25  91 % - -  07/22/12 1122 - - - 106  25  90 % - -  07/22/12 1121 - - - 108  25  90 % - -  07/22/12 1120 - - - 108  25  90 % - -  07/22/12 1119 - - - 106  25  90  % - -  07/22/12 1118 - - - 105  24  89 % - -  07/22/12 1117 - - - 106  25  90 % - -  07/22/12 1116 - - - 109  25  91 % - -  07/22/12 1115 - - - 108  25  91 % - -  07/22/12 1114 - - - 110  24  91 % - -  07/22/12 1113 - - - 109  26  90 % - -  07/22/12 1112 150/77 mmHg - - 108  25  91 % - -  07/22/12 1111 - - - 111  23  91 % - -  07/22/12 1110 - - - 111  24  90 % - -  07/22/12 1109 - - - 106  25  90 % - -  07/22/12 1108 - - - 107  25  91 % - -  07/22/12 1107 - - - 109  24  90 % - -  07/22/12 1106 - - - 106  23  90 % - -  07/22/12 1105 - - - 106  25  90 % - -  07/22/12 1104 - - - 110  24  90 % - -  07/22/12 1103 - - - 107  25  90 % - -  07/22/12 1102 - - - 108  25  91 % - -  07/22/12 1101 - - - 110  24  90 % - -  07/22/12 1100 - - - 106  24  89 % - -  07/22/12 1059 - - - 106  25 **Note De-Identified Tavano Obfuscation** 89 % - -  07/22/12 1058 - - - 109  24  90 % - -  07/22/12 1057 150/76 mmHg - - 109  24  90 % - -  07/22/12 1056 - - - 105  25  89 % - -  07/22/12 1055 - - - 108  23  89 % - -  07/22/12 1054 - - - 109  24  89 % - -  07/22/12 1053 - - - 108  24  88 % - -  07/22/12 1052 - - - 107  24  89 % - -  07/22/12 1051 - - - 107  24  90 % - -  07/22/12 1050 - - - 109  25  89 % - -  07/22/12 1049 - - - 108  21  89 % - -  07/22/12 1048 - - - 109  24  90 % - -  07/22/12 1047 - - - 106  24  89 % - -  07/22/12 1046 - - - 105  23  89 % - -  07/22/12 1045 - - - 109  25  88 % - -  07/22/12 1044 - - - 104  24  88 % - -  07/22/12 1043 - - - 106  23  89 % - -  07/22/12 1042 159/86 mmHg - - 108  24  88 % - -  07/22/12 1041 - - - 106  23  88 % - -  07/22/12 1040 - - - 105  24  87 % - -  07/22/12 1039 - - - 106  23  89 % - -  07/22/12 1038 - - - 108  24  89 % - -  07/22/12 1037 - - - 107  24  89 % - -  07/22/12 1036 - - - 107  23  87 % - -  07/22/12 1035 - - - 108  23  87 % - -  07/22/12 1034 - - - 105  22  87 % - -  07/22/12 1033 - - - 105  23  89 % - -  07/22/12 1032 - - - 108  22  87 % - -  07/22/12 1031 - - - 106  24  85  % - -  07/22/12 1030 - - - 108  30  83 % - -  07/22/12 1029 - - - 110  35  85 % - -  07/22/12 1028 - - - 108  23  88 % - -  07/22/12 1027 127/72 mmHg - - 106  21  92 % - -  07/22/12 1026 - - - 108  21  92 % - -  07/22/12 1025 - - - 108  22  92 % - -  07/22/12 1024 - - - 105  20  92 % - -  07/22/12 1023 - - - 107  21  92 % - -  07/22/12 1022 - - - 105  20  92 % - -  07/22/12 1021 - - - 105  21  93 % - -  07/22/12 1020 - - - 108  21  91 % - -  07/22/12 1019 - - - 106  20  91 % - -  07/22/12 1018 - - - 106  21  91 % - -  07/22/12 1017 - - - 106  20  91 % - -  07/22/12 1016 - - - **Note De-Identified Xiao Obfuscation** 105  20  91 % - -  07/22/12 1015 - - - 109  22  90 % - -  07/22/12 1014 - - - 107  20  88 % - -  07/22/12 1013 - - - 108  20  86 % - -  07/22/12 1012 151/82 mmHg - - 111  21  83 % - -  07/22/12 1011 - - - 110  21  84 % - -  07/22/12 1010 - - - 107  20  84 % - -  07/22/12 1009 - - - 112  21  82 % - -  07/22/12 1008 - - - 110  24  85 % - -  07/22/12 1007 - - - 109  20  87 % - -  07/22/12 1006 - - - 109  23  88 % - -  07/22/12 1005 - - - 109  21  87 % - -  07/22/12 1004 - - - 109  22  87 % - -  07/22/12 1003 - - - 108  21  87 % - -  07/22/12 1002 - - - 112  23  89 % - -  07/22/12 1001 - - - 109  21  88 % - -  07/22/12 1000 - - - 115  24  86 % - -  07/22/12 0959 - - - 111  30  83 % - -  07/22/12 0958 - - - 113  26  80 % - -  07/22/12 0957 137/63 mmHg - - - - - - -  07/22/12 0952 - 97.4 F (36.3 C) - - - - - -     Recent laboratory studies:  Basename 07/23/12 0505  WBC 14.6*  HGB 14.7  HCT 45.7  PLT 174  NA 143  K 4.1  CL 101  CO2 33*  BUN 10  CREATININE 0.57  GLUCOSE 125*  INR --  CALCIUM 9.2     Discharge Medications:     Medication List     As of 07/23/2012  7:41 AM    STOP taking these medications         ibuprofen 200 MG tablet   Commonly known as: ADVIL,MOTRIN      TAKE these medications         albuterol (2.5 MG/3ML) 0.083% nebulizer solution   Commonly known as:  PROVENTIL   Take 3 mLs (2.5 mg total) by nebulization every 4 (four) hours as needed for wheezing or shortness of breath. DX: 496      atorvastatin 20 MG tablet   Commonly known as: LIPITOR   Take 1 tablet (20 mg total) by mouth daily.      budesonide-formoterol 160-4.5 MCG/ACT inhaler   Commonly known as: SYMBICORT   Inhale 2 puffs into the lungs daily.      cetirizine 10 MG tablet   Commonly known as: ZYRTEC   Take 10 mg by mouth daily.      Compressor/Nebulizer Misc   Use up to 4 times daily when needed      folic acid 1 MG tablet   Commonly known as: FOLVITE   Take 1 mg by mouth daily.      LORazepam 0.5 MG tablet   Commonly known as: ATIVAN   Take 0.5 mg by mouth every 8 (eight) hours as needed. For anxiety      methotrexate 2.5 MG tablet   Commonly known as: RHEUMATREX   25 mg **Note De-Identified Gatton Obfuscation** once a week. On wednesday  Caution:Chemotherapy. Protect from light.      omeprazole 20 MG capsule   Commonly known as: PRILOSEC   Take 20 mg by mouth daily.      oxyCODONE-acetaminophen 5-325 MG per tablet   Commonly known as: PERCOCET/ROXICET   Take 1-2 tablets by mouth every 4 (four) hours as needed for pain.        Diagnostic Studies: No results found.  Disposition: 01-Home or Self Care      Discharge Orders    Future Appointments: Provider: Department: Dept Phone: Center:   09/15/2012 10:00 AM Waymon Budge, MD [facility] [phone] None   10/24/2012 9:00 AM Corwin Levins, MD [facility] [phone] [facility]     Future Orders Please Complete By Expires   Diet - low sodium heart healthy      Call MD / Call 911      Comments:   If you experience chest pain or shortness of breath, CALL 911 and be transported to the hospital emergency room.  If you develope a fever above 101 F, pus (white drainage) or increased drainage or redness at the wound, or calf pain, call your surgeon's office.   Constipation Prevention      Comments:   Drink plenty of fluids.  Prune juice  may be helpful.  You may use a stool softener, such as Colace (over the counter) 100 mg twice a day.  Use MiraLax (over the counter) for constipation as needed.   Increase activity slowly as tolerated      Driving restrictions      Comments:   No driving until cleared by physician.   Lifting restrictions      Comments:   No lifting until cleared by physician.      Follow-up Information    Follow up with Mable Paris, MD. Schedule an appointment as soon as possible for a visit in 10 days.   Contact information:   [facility] [address] [place] [address] [phone]           Signed: Jiles Harold 07/23/2012, 7:41 AM

## 2012-09-01 ENCOUNTER — Other Ambulatory Visit: Payer: Self-pay

## 2012-09-01 MED ORDER — LORAZEPAM 0.5 MG PO TABS: 0.5000 mg | ORAL_TABLET | Freq: Two times a day (BID) | ORAL | Status: DC | PRN

## 2012-09-01 NOTE — Telephone Encounter (Signed)
**Note De-identified Aymond Obfuscation** Done hardcopy to robin  

## 2012-09-02 NOTE — Telephone Encounter (Signed)
**Note De-identified Molla Obfuscation** Faxed hardcopy to pharmacy. 

## 2012-09-15 ENCOUNTER — Ambulatory Visit (INDEPENDENT_AMBULATORY_CARE_PROVIDER_SITE_OTHER): Payer: Medicare Other | Admitting: Internal Medicine

## 2012-09-15 ENCOUNTER — Encounter: Payer: Self-pay | Admitting: Internal Medicine

## 2012-09-15 VITALS — BP 146/82 | HR 92 | Ht 65.0 in | Wt 276.8 lb

## 2012-09-15 DIAGNOSIS — J449 Chronic obstructive pulmonary disease, unspecified: Secondary | ICD-10-CM

## 2012-09-15 DIAGNOSIS — Z23 Encounter for immunization: Secondary | ICD-10-CM

## 2012-09-15 DIAGNOSIS — F17200 Nicotine dependence, unspecified, uncomplicated: Secondary | ICD-10-CM

## 2012-09-15 DIAGNOSIS — G4733 Obstructive sleep apnea (adult) (pediatric): Secondary | ICD-10-CM

## 2012-09-15 DIAGNOSIS — J42 Unspecified chronic bronchitis: Secondary | ICD-10-CM

## 2012-09-15 MED ORDER — DOXYCYCLINE HYCLATE 100 MG PO TABS: ORAL_TABLET | ORAL | Status: DC

## 2012-09-15 MED ORDER — BUPROPION HCL ER (SR) 150 MG PO TB12: 150.0000 mg | ORAL_TABLET | Freq: Two times a day (BID) | ORAL | Status: DC

## 2012-09-15 NOTE — Patient Instructions (Addendum)
**Note De-Identified Pitner Obfuscation** Script for bupropion/ Welbutrin sent.            To take twice daily.  Set your plan to stop smoking after 7 days on Welbutrin   Flu vax  Depo 80  Script sent for doxycycline antibiotic

## 2012-09-15 NOTE — Progress Notes (Signed)
**Note De-Identified Karwowski Obfuscation** Patient ID: Hailey Hernandez, female    DOB: 1956/03/07, 56 y.o.   MRN: [id number]  HPI 42 female with known hx of chronic bronchitis , OSA/OHS , active smoker   02/05/11- 55 yoFwith chronic bronchitis, tobacco use, OSA, obesity hypoventilation syndrome. Last here December 15, 2010. Since then has continued Symbicort. Comes with sister today. Sleeping 2 nights ago- woke smothered. Had to pull off CPAP. She gradually cleared over several minutes. Not clear if she refluxed, had mucus plug or ??. Today is typical with no residual. Chest congestion varies day to day. Says cough better, not coughing up anything purulent or bloody. Still averaging 1.5 PPD, down from 3 PPD.  Compliant with CPAP- needs new mask.   07/12/2011 Acute OV  Presents for an acute office visit. Complains pain in right side x 3 days--more painful with cough and movement.  cough with green sputum--. She has been sick for 2 weeks, cough, congestion , drainage. Was exposed to URI symptoms at hospital . Started with lots of drippy nose and drainge.  No fever. Initially phoned in a zpack with no improvement. Then started on Omnicef 4 days ago.  Does have some wheezing . Now pain with inspiration esp along right lateral ribs. Tender to touch and with movement. No rash or fever.   08/10/11- 24 yoFwith chronic bronchitis, tobacco use, OSA, obesity hypoventilation syndrome. Several family members here.[facility] Kitchen PCP Dr Edrick Oh She describes persistent ear ache on the right, nasal discharge from the left with little bit of bloody mucus. Distal coughing productively with light yellow sputum, no blood. Questions low-grade fever mainly because she sometimes feels chilly. Smoking is down from 3 packs a day to a little under one pack per day. I encouraged her to keep working at. She gives history of a cholesteatoma and told never to put water in her right ear.  12/10/11- 11 yoFwith chronic bronchitis, tobacco use, OSA, obesity hypoventilation syndrome.  Sister  here. Her sister Hailey Hernandez died in her sleep of end-stage COPD complicated by alpha-1 antitrypsin deficiency. We had called in Augmentin which did help some. Complains now of sinus pressure, blowing bloody scabs from her nose, postnasal drip, cough productive of thick mucus. Denies fever, swollen glands, headache. She has decided that she finally wants to quit smoking. Has reduced from 3 packs per day habit to one pack per day. We discussed support, medical reasons and available techniques and answered her questions.  05/15/12-55 yoFwith chronic bronchitis, tobacco use, OSA, obesity hypoventilation syndrome.  Sister here.  Pt states breathing has been "okay" since last seen. Pt c/o increased SOB and allergy trouble with HA . Pt denies prod cough, wheezing and chest tightness.. unhappy with [name] and wants to change to [name]. CPAP 13/ O2 2L/ [facility] Parking requested  COPD assessment test (CAT) score 14/40 CXR 07/12/11- IMPRESSION:  Bronchitic changes. Left base atelectasis.  Original Report Authenticated By: Raelyn Number, M.D.   09/15/12- 56 yoFwith chronic bronchitis, tobacco use, OSA, obesity hypoventilation syndrome.  Sister here. Follows for: pt states last couple of weeks increase sob,productive cough.chest tightness.denies any wheezing.  For 2 weeks her eyes had been itching and she has had frontal headache, watery rhinorrhea and a little bit of epistaxis. Scratchy throat. Questions low-grade fever. Treating with antihistamines. She continues oxygen 2-3 L per minute/ [facility]othe[facility]. Continues good compliance with CPAP 13+ oxygen 2 L at night. She is making no effort to stop smoking despite our counseling. CXR 05/15/12-reviewed IMPRESSION:  Stable **Note De-Identified Blecher Obfuscation** bronchitic changes and interstitial prominence.  Lingular scarring or atelectasis.  Borderline cardiomegaly.  Original Report Authenticated By: Cyndie Chime, M.D.   Review of Systems-see HPI Constitutional:   No-   weight  loss, night sweats, fevers, chills, fatigue, lassitude. HEENT:   + headaches, difficulty swallowing, tooth/dental problems, sore throat,       No-  sneezing, itching,  ear ache,  +nasal congestion, post nasal drip,  CV:  No-   chest pain, orthopnea, PND, swelling in lower extremities, anasarca,   dizziness, palpitations Resp: No- acute  shortness of breath with exertion or at rest.              +   productive cough,  + non-productive cough,  No- coughing up of blood.              No-   change in color of mucus.  No- wheezing.   Skin: psoriasis GI:  No-   heartburn, indigestion, abdominal pain, nausea, vomiting,  GU: No-    MS:  No-   joint pain or swelling. . Neuro-     nothing unusual Psych:  No- change in mood or affect. No depression or anxiety.  No memory loss.   Objective:   Physical Exam BP 146/82  Pulse 92  Ht 5\' 5"  (1.651 m)  Wt 276 lb 12.8 oz (125.556 kg)  BMI 46.06 kg/m2  SpO2 92% General- Alert, Oriented, Affect-appropriate, Distress- none acute, obese. On 3L O2 Skin- +extensive psoriasis,  Lymphadenopathy- none Head- atraumatic            Eyes- Gross vision intact, PERRLA, conjunctivae clear secretions            Ears- Hearing, canals- cerumen/ debris R            Nose- blood/ scabs left nostril, no-Septal dev, mucus, polyps, erosion, perforation             Throat- Mallampati II , mucosa clear , drainage- none, tonsils- atrophic. Mouth breathing Neck- flexible , trachea midline, no stridor , thyroid nl, carotid no bruit Chest - symmetrical excursion , unlabored           Heart/CV- RRR , no murmur , no gallop  , no rub, nl s1 s2                           - JVD- none , edema- none, stasis changes+ bilateral, varices- none           Lung- +decreased w/ loose cough , dullness-none, rub- none           Chest wall-  Abd-  Br/ Gen/ Rectal- Not done, not indicated Extrem- cyanosis- none, clubbing, none, atrophy- none, strength- nl Neuro- grossly intact to  observation

## 2012-09-16 MED ORDER — METHYLPREDNISOLONE ACETATE 80 MG/ML IJ SUSP
80.0000 mg | Freq: Once | INTRAMUSCULAR | Status: AC
  Administered 2012-09-16: 80 mg via INTRAMUSCULAR

## 2012-09-27 NOTE — Assessment & Plan Note (Signed)
**Note De-identified Sox Obfuscation** - **Note De-Identified Manigo Obfuscation** Emphasized the relation of her active smoking to her chronic lung disease and her acute respiratory infections. She expresses some interest in trying to stop now and we reviewed options. Plan-counseling program if she can get to it from [facility]. Wellbutrin SR 150 mg twice a day. Stop smoking one week after beginning the medication.

## 2012-09-27 NOTE — Assessment & Plan Note (Signed)
**Note De-Identified Gaetano Obfuscation** Acute upper respiratory infection and bronchitis. Plan-Depo-Medrol, doxycycline, fluids. Flu vaccine

## 2012-09-27 NOTE — Assessment & Plan Note (Signed)
**Note De-Identified Peeler Obfuscation** Compliance seems better.

## 2012-10-24 ENCOUNTER — Ambulatory Visit: Payer: Medicare Other | Admitting: Internal Medicine

## 2012-10-31 ENCOUNTER — Ambulatory Visit: Payer: Medicare Other | Admitting: Internal Medicine

## 2012-11-07 ENCOUNTER — Ambulatory Visit (INDEPENDENT_AMBULATORY_CARE_PROVIDER_SITE_OTHER)
Admission: RE | Admit: 2012-11-07 | Discharge: 2012-11-07 | Disposition: A | Discharge status: Home / Self Care | Payer: Medicare Other | Source: Ambulatory Visit | Attending: Internal Medicine | Admitting: Internal Medicine

## 2012-11-07 ENCOUNTER — Encounter: Payer: Self-pay | Admitting: Internal Medicine

## 2012-11-07 ENCOUNTER — Ambulatory Visit (INDEPENDENT_AMBULATORY_CARE_PROVIDER_SITE_OTHER): Payer: Medicare Other | Admitting: Internal Medicine

## 2012-11-07 ENCOUNTER — Other Ambulatory Visit (INDEPENDENT_AMBULATORY_CARE_PROVIDER_SITE_OTHER): Payer: Medicare Other

## 2012-11-07 VITALS — BP 120/80 | HR 112 | Temp 97.5°F | Ht 65.0 in | Wt 281.2 lb

## 2012-11-07 DIAGNOSIS — R0609 Other forms of dyspnea: Secondary | ICD-10-CM

## 2012-11-07 DIAGNOSIS — R0600 Dyspnea, unspecified: Secondary | ICD-10-CM

## 2012-11-07 DIAGNOSIS — H1010 Acute atopic conjunctivitis, unspecified eye: Secondary | ICD-10-CM | POA: Insufficient documentation

## 2012-11-07 DIAGNOSIS — R0989 Other specified symptoms and signs involving the circulatory and respiratory systems: Secondary | ICD-10-CM

## 2012-11-07 DIAGNOSIS — H1045 Other chronic allergic conjunctivitis: Secondary | ICD-10-CM

## 2012-11-07 DIAGNOSIS — R1900 Intra-abdominal and pelvic swelling, mass and lump, unspecified site: Secondary | ICD-10-CM | POA: Insufficient documentation

## 2012-11-07 LAB — URINALYSIS, ROUTINE W REFLEX MICROSCOPIC
Bilirubin Urine: NEGATIVE
Hgb urine dipstick: NEGATIVE
Ketones, ur: NEGATIVE
Leukocytes, UA: NEGATIVE
Nitrite: POSITIVE
Specific Gravity, Urine: 1.025 (ref 1.000–1.030)
Urine Glucose: NEGATIVE
Urobilinogen, UA: 0.2 (ref 0.0–1.0)
pH: 6 (ref 5.0–8.0)

## 2012-11-07 LAB — CBC WITH DIFFERENTIAL/PLATELET
Basophils Absolute: 0 K/uL (ref 0.0–0.1)
Basophils Relative: 0.4 % (ref 0.0–3.0)
Eosinophils Absolute: 0.2 K/uL (ref 0.0–0.7)
Eosinophils Relative: 1.6 % (ref 0.0–5.0)
HCT: 45.2 % (ref 36.0–46.0)
Hemoglobin: 15.2 g/dL — ABNORMAL HIGH (ref 12.0–15.0)
Lymphocytes Relative: 16.5 % (ref 12.0–46.0)
Lymphs Abs: 1.9 K/uL (ref 0.7–4.0)
MCHC: 33.5 g/dL (ref 30.0–36.0)
MCV: 98.3 fl (ref 78.0–100.0)
Monocytes Absolute: 0.7 K/uL (ref 0.1–1.0)
Monocytes Relative: 5.8 % (ref 3.0–12.0)
Neutro Abs: 8.7 K/uL — ABNORMAL HIGH (ref 1.4–7.7)
Neutrophils Relative %: 75.7 % (ref 43.0–77.0)
Platelets: 193 K/uL (ref 150.0–400.0)
RBC: 4.6 Mil/uL (ref 3.87–5.11)
RDW: 16 % — ABNORMAL HIGH (ref 11.5–14.6)
WBC: 11.5 K/uL — ABNORMAL HIGH (ref 4.5–10.5)

## 2012-11-07 LAB — BASIC METABOLIC PANEL
Calcium: 8.9 mg/dL (ref 8.4–10.5)
Creatinine, Ser: 0.7 mg/dL (ref 0.4–1.2)
GFR: 89.63 mL/min (ref >60.00)
Glucose, Bld: 211 mg/dL — ABNORMAL HIGH (ref 70–99)
Sodium: 140 mEq/L (ref 135–145)

## 2012-11-07 LAB — HEPATIC FUNCTION PANEL
ALT: 33 U/L (ref 0–35)
AST: 31 U/L (ref 0–37)
Albumin: 3.6 g/dL (ref 3.5–5.2)
Alkaline Phosphatase: 147 U/L — ABNORMAL HIGH (ref 39–117)
Bilirubin, Direct: 0.1 mg/dL (ref 0.0–0.3)
Total Bilirubin: 0.6 mg/dL (ref 0.3–1.2)
Total Protein: 7.8 g/dL (ref 6.0–8.3)

## 2012-11-07 MED ORDER — METHYLPREDNISOLONE ACETATE 80 MG/ML IJ SUSP
120.0000 mg | Freq: Once | INTRAMUSCULAR | Status: AC
Start: 2012-11-07 — End: 2012-11-07
  Administered 2012-11-07: 120 mg via INTRAMUSCULAR

## 2012-11-07 MED ORDER — FUROSEMIDE 20 MG PO TABS: 20.0000 mg | ORAL_TABLET | Freq: Every day | ORAL | Status: DC | PRN

## 2012-11-07 NOTE — Progress Notes (Signed)
**Note De-Identified Boerema Obfuscation** Subjective:    Patient ID: Hailey Hernandez, female    DOB: 05-30-1956, 57 y.o.   MRN: [id number]  HPI  Here to f/u, last seen per Dr Maple Hudson dec 2013, and here x 6 mo with wt gain overall 11 lbs since July 2013 most in the last 2 mo per pt after BP med (including diuretic) stopped due to low BP.  Pt denies chest pain, increased sob or doe (seems at baseline), wheezing, orthopnea, PND,  palpitations, dizziness or syncope, but has mild increased LE edema, but is most concerned about her abdomen painless swelling that seems to her more fluid than adiposity, seems to "ride up" and causes more dyspnea than before with sitting, lying down is ok.  No recent fever or wheezing.  Has had 1 wk of bilat eye itch and clearish fluid d/c with puffiness of the eyelids, painless, and no vision change, though has had intermittent HA as well.  Unfortunately still smoking though cont's to try to "cut back."   Past Medical History  Diagnosis Date  . Tachyarrhythmia   . Tobacco abuse   . Chronic bronchitis   . History of thyroid cancer   . Rhinitis   . Polymyalgia   . Diverticulitis   . DJD (degenerative joint disease)     spine  . Arthritis   . COPD (chronic obstructive pulmonary disease)   . Hyperlipidemia   . GERD (gastroesophageal reflux disease)   . Fibromyalgia   . Arthritis     bil legs  . Asthma   . Glaucoma   . Allergy   . Anxiety disorder   . Depression   . Cancer   . Complication of anesthesia     20 yrs ago turned blue after surgery  . HTN (hypertension)     no longer on BP medication  . Shortness of breath   . OSA (obstructive sleep apnea)     CPAP  last sleep study 8-10 yr.ag0  . Psoriasis    Past Surgical History  Procedure Date  . Total abdominal hysterectomy   . Cholecystectomy   . Appendectomy   . Carpal tunnel release   . Left shoulder spurs   . Orif right lower leg   . Cholesteatoma excision     right ear  . Carpal tunnel release 10/30/2011    Procedure: CARPAL TUNNEL  RELEASE;  Surgeon: Nicki Reaper, MD;  Location: [facility];  Service: Orthopedics;  Laterality: Right;  and mass excision  . Partial throidectomy     reports that she has been smoking Cigarettes.  She has a 67.5 pack-year smoking history. She has never used smokeless tobacco. She reports that she does not drink alcohol or use illicit drugs. family history includes Allergies in her sister; Alpha-1 antitrypsin deficiency in her sisters; Asthma in her sister; Cancer in her brother, mother, and sister; Emphysema in her sisters; Heart disease in her father and mother; and Tuberculosis in her other. Allergies  Allergen Reactions  . Milnacipran     REACTION: dizzy  . Tramadol     Did not work   Current Outpatient Prescriptions on File Prior to Visit  Medication Sig Dispense Refill  . albuterol (PROVENTIL) (2.5 MG/3ML) 0.083% nebulizer solution Take 3 mLs (2.5 mg total) by nebulization every 4 (four) hours as needed for wheezing or shortness of breath. DX: 496  180 vial  prn  . atorvastatin (LIPITOR) 20 MG tablet Take 1 tablet (20 mg total) by mouth daily. **Note De-Identified Forand Obfuscation** 90 tablet  3  . budesonide-formoterol (SYMBICORT) 160-4.5 MCG/ACT inhaler Inhale 2 puffs into the lungs daily.       . cetirizine (ZYRTEC) 10 MG tablet Take 10 mg by mouth daily.      Marland Kitchen doxycycline (VIBRA-TABS) 100 MG tablet 2 today then one daily  8 tablet  0  . folic acid (FOLVITE) 1 MG tablet Take 1 mg by mouth daily.       Marland Kitchen LORazepam (ATIVAN) 0.5 MG tablet Take 1 tablet (0.5 mg total) by mouth 2 (two) times daily as needed. For anxiety  60 tablet  1  . methotrexate (RHEUMATREX) 2.5 MG tablet 25 mg once a week. On wednesday Caution:Chemotherapy. Protect from light.      . Nebulizers (COMPRESSOR/NEBULIZER) MISC Use up to 4 times daily when needed  1 each  0  . omeprazole (PRILOSEC) 20 MG capsule Take 20 mg by mouth daily.       Marland Kitchen buPROPion (WELLBUTRIN SR) 150 MG 12 hr tablet Take 1 tablet (150 mg total) by mouth 2 (two) times  daily.  60 tablet  5  . furosemide (LASIX) 20 MG tablet Take 1 tablet (20 mg total) by mouth daily as needed.  90 tablet  3  . [DISCONTINUED] doxepin (SINEQUAN) 10 MG capsule 1-2 at bedtime       . [DISCONTINUED] fexofenadine (ALLEGRA) 180 MG tablet Take 180 mg by mouth daily.         Review of Systems  Constitutional: Negative for unexpected weight change, or unusual diaphoresis  HENT: Negative for tinnitus.   Eyes: Negative for photophobia and visual disturbance.  Respiratory: Negative for choking and stridor.   Gastrointestinal: Negative for vomiting and blood in stool.  Genitourinary: Negative for hematuria and decreased urine volume.  Musculoskeletal: Negative for acute joint swelling Skin: Negative for color change and wound.  Neurological: Negative for tremors and numbness other than noted  Psychiatric/Behavioral: Negative for decreased concentration or  hyperactivity.       Objective:   Physical Exam BP 120/80  Pulse 112  Temp 97.5 F (36.4 C) (Oral)  Ht 5\' 5"  (1.651 m)  Wt 281 lb 4 oz (127.574 kg)  BMI 46.80 kg/m2  SpO2 92% VS noted, morbid obese, chronic ill, on Home o2  HENT: Head: NCAT. No thrush  Right Ear: External ear normal.  Left Ear: External ear normal.  Bilat Tm's clear Eyes: Conjunctivae with bilat mild erythema and clearish d/c with puffiness bilat upper/lower lids, and EOM are normal. Pupils are equal, round, and reactive to light.  Neck: Normal range of motion. Neck supple.  Cardiovascular: Normal rate and regular rhythm.   Pulmonary/Chest: Effort normal and breath sounds normal.  Abd:  Soft, NT, non-distended, + BS but ? Fluid wave Neurological: Pt is alert. Not confused , motor intact Skin: Skin is warm. No erythema. but LE 's with trace to 1+ bilat edema to knees Psychiatric: Pt behavior is normal. Thought content normal.     Assessment & Plan:

## 2012-11-07 NOTE — Assessment & Plan Note (Addendum)
**Note De-Identified Rissmiller Obfuscation** pulm fxn appears at baseline it seems overall but ? More restricitive issue due to increased wt (fat vs volume increase); for cxr, labs, ok for lasix 20 mg prn

## 2012-11-07 NOTE — Patient Instructions (Addendum)
**Note De-Identified Matteo Obfuscation** You had the steroid shot today Please take all new medication as prescribed - the low dose lasix 20 mg as needed for swelling Please continue all other medications as before Please go to the XRAY Department in the Basement (go straight as you get off the elevator) for the x-ray testing Please go to the LAB in the Basement (turn left off the elevator) for the tests to be done today You will be contacted regarding the referral for: abdomen ultrasound You will be contacted by phone if any changes need to be made immediately.  Otherwise, you will receive a letter about your results with an explanation, but please check with MyChart first. Thank you for enrolling in MyChart. Please follow the instructions below to securely access your online medical record. MyChart allows you to send messages to your doctor, view your test results, renew your prescriptions, schedule appointments, and more. To Log into My Chart online, please go by [employer] or [employer] to [email or web address], or download the MyChart App from the [employer] of [employer] .  Your Username is: [email or web address] (pass [phone]) Please send a practice Message on Mychart later today. Please keep your appointments with your specialists as you have planned - Dr Maple Hudson Please stop smoking Please return in 6 months, or sooner if needed

## 2012-11-07 NOTE — Assessment & Plan Note (Signed)
**Note De-Identified Kimberlin Obfuscation** Exam with ? Fluid wave, is morbid obese but will ask for u/s to assess for ascites as well as other exam

## 2012-11-07 NOTE — Assessment & Plan Note (Signed)
**Note De-Identified Yabut Obfuscation** Ok for repeat depo 80 mg IM,  to f/u any worsening symptoms or concerns

## 2012-11-10 ENCOUNTER — Telehealth: Payer: Self-pay | Admitting: Internal Medicine

## 2012-11-10 MED ORDER — CIPROFLOXACIN HCL 500 MG PO TABS: 500.0000 mg | ORAL_TABLET | Freq: Two times a day (BID) | ORAL | Status: DC

## 2012-11-10 NOTE — Telephone Encounter (Signed)
**Note De-identified Villamizar Obfuscation** Done erx 

## 2012-11-12 ENCOUNTER — Other Ambulatory Visit: Payer: Medicare Other

## 2012-11-18 ENCOUNTER — Other Ambulatory Visit: Payer: Medicare Other

## 2012-11-18 ENCOUNTER — Ambulatory Visit
Admission: RE | Admit: 2012-11-18 | Discharge: 2012-11-18 | Disposition: A | Discharge status: Home / Self Care | Payer: Medicare Other | Source: Ambulatory Visit | Attending: Internal Medicine | Admitting: Internal Medicine

## 2012-11-18 DIAGNOSIS — R1900 Intra-abdominal and pelvic swelling, mass and lump, unspecified site: Secondary | ICD-10-CM

## 2012-11-24 ENCOUNTER — Other Ambulatory Visit: Payer: Self-pay

## 2012-11-24 MED ORDER — LORAZEPAM 0.5 MG PO TABS: 0.5000 mg | ORAL_TABLET | Freq: Two times a day (BID) | ORAL | Status: DC | PRN

## 2012-11-24 NOTE — Telephone Encounter (Signed)
**Note De-identified Penalver Obfuscation** Done hardcopy to robin  

## 2012-11-25 NOTE — Telephone Encounter (Signed)
**Note De-identified Tobey Obfuscation** Faxed hardcopy to pharmacy. 

## 2012-11-26 ENCOUNTER — Telehealth: Payer: Self-pay | Admitting: Internal Medicine

## 2012-11-26 ENCOUNTER — Encounter: Payer: Self-pay | Admitting: Internal Medicine

## 2012-11-26 MED ORDER — HYDROCODONE-ACETAMINOPHEN 5-325 MG PO TABS: 1.0000 | ORAL_TABLET | Freq: Two times a day (BID) | ORAL | Status: DC | PRN

## 2012-11-26 NOTE — Telephone Encounter (Signed)
**Note De-identified Fatula Obfuscation** Done hardcopy to robin  

## 2012-11-26 NOTE — Telephone Encounter (Signed)
**Note De-identified Ruhlman Obfuscation** Faxed hardcopy to pharmacy. 

## 2012-12-16 ENCOUNTER — Ambulatory Visit: Payer: Medicare Other | Admitting: Internal Medicine

## 2012-12-17 ENCOUNTER — Encounter: Payer: Self-pay | Admitting: Internal Medicine

## 2012-12-17 ENCOUNTER — Ambulatory Visit (INDEPENDENT_AMBULATORY_CARE_PROVIDER_SITE_OTHER): Payer: Medicare Other | Admitting: Internal Medicine

## 2012-12-17 VITALS — BP 112/68 | HR 101 | Temp 97.1°F | Ht 65.0 in | Wt 279.4 lb

## 2012-12-17 DIAGNOSIS — R062 Wheezing: Secondary | ICD-10-CM

## 2012-12-17 DIAGNOSIS — F341 Dysthymic disorder: Secondary | ICD-10-CM

## 2012-12-17 DIAGNOSIS — J069 Acute upper respiratory infection, unspecified: Secondary | ICD-10-CM | POA: Insufficient documentation

## 2012-12-17 DIAGNOSIS — I10 Essential (primary) hypertension: Secondary | ICD-10-CM

## 2012-12-17 MED ORDER — METHYLPREDNISOLONE ACETATE 80 MG/ML IJ SUSP
80.0000 mg | Freq: Once | INTRAMUSCULAR | Status: AC
  Administered 2012-12-17: 80 mg via INTRAMUSCULAR

## 2012-12-17 MED ORDER — AZITHROMYCIN 250 MG PO TABS: ORAL_TABLET | ORAL | Status: DC

## 2012-12-17 NOTE — Assessment & Plan Note (Signed)
**Note De-Identified Voong Obfuscation** stable overall by history and exam, recent data reviewed with pt, and pt to continue medical treatment as before,  to f/u any worsening symptoms or concerns BP Readings from Last 3 Encounters:  12/17/12 112/68  11/07/12 120/80  09/15/12 146/82

## 2012-12-17 NOTE — Assessment & Plan Note (Signed)
**Note De-Identified Lecrone Obfuscation** Mild to mod, for antibx course,  to f/u any worsening symptoms or concerns, ? viral vs other

## 2012-12-17 NOTE — Progress Notes (Signed)
**Note De-Identified Kurek Obfuscation** Subjective:    Patient ID: Hailey Hernandez, female    DOB: [birth year], 57 y.o.   MRN: [id number]  HPI    Here with 2-3 days acute onset fever, facial pain, pressure, headache, general weakness and malaise, and greenish d/c, with mild ST and cough, but pt denies chest pain, wheezing, increased sob or doe, orthopnea, PND, increased LE swelling, palpitations, dizziness or syncope except for onset midl wheezing last PM with mild sob.  Pt denies new neurological symptoms such as new headache, or facial or extremity weakness or numbness   Pt denies polydipsia, polyuria,  Denies worsening depressive symptoms, suicidal ideation, or panic Past Medical History  Diagnosis Date  . Tachyarrhythmia   . Tobacco abuse   . Chronic bronchitis   . History of thyroid cancer   . Rhinitis   . Polymyalgia   . Diverticulitis   . DJD (degenerative joint disease)     spine  . Arthritis   . COPD (chronic obstructive pulmonary disease)   . Hyperlipidemia   . GERD (gastroesophageal reflux disease)   . Fibromyalgia   . Arthritis     bil legs  . Asthma   . Glaucoma   . Allergy   . Anxiety disorder   . Depression   . Cancer   . Complication of anesthesia     20 yrs ago turned blue after surgery  . HTN (hypertension)     no longer on BP medication  . Shortness of breath   . OSA (obstructive sleep apnea)     CPAP  last sleep study 8-10 yr.ag0  . Psoriasis    Past Surgical History  Procedure Laterality Date  . Total abdominal hysterectomy    . Cholecystectomy    . Appendectomy    . Carpal tunnel release    . Left shoulder spurs    . Orif right lower leg    . Cholesteatoma excision      right ear  . Carpal tunnel release  10/30/2011    Procedure: CARPAL TUNNEL RELEASE;  Surgeon: Nicki Reaper, MD;  Location: [facility];  Service: Orthopedics;  Laterality: Right;  and mass excision  . Partial throidectomy      reports that she has been smoking Cigarettes.  She has a 67.5 pack-year smoking  history. She has never used smokeless tobacco. She reports that she does not drink alcohol or use illicit drugs. family history includes Allergies in her sister; Alpha-1 antitrypsin deficiency in her sisters; Asthma in her sister; Cancer in her brother, mother, and sister; Emphysema in her sisters; Heart disease in her father and mother; and Tuberculosis in her other. Allergies  Allergen Reactions  . Milnacipran     REACTION: dizzy  . Tramadol     Did not work   Current Outpatient Prescriptions on File Prior to Visit  Medication Sig Dispense Refill  . albuterol (PROVENTIL) (2.5 MG/3ML) 0.083% nebulizer solution Take 3 mLs (2.5 mg total) by nebulization every 4 (four) hours as needed for wheezing or shortness of breath. DX: 496  180 vial  prn  . atorvastatin (LIPITOR) 20 MG tablet Take 1 tablet (20 mg total) by mouth daily.  90 tablet  3  . budesonide-formoterol (SYMBICORT) 160-4.5 MCG/ACT inhaler Inhale 2 puffs into the lungs daily.       . cetirizine (ZYRTEC) 10 MG tablet Take 10 mg by mouth daily.      . folic acid (FOLVITE) 1 MG tablet Take 1 mg by mouth daily.       Marland Kitchen **Note De-Identified Skufca Obfuscation** furosemide (LASIX) 20 MG tablet Take 1 tablet (20 mg total) by mouth daily as needed.  90 tablet  3  . HYDROcodone-acetaminophen (NORCO/VICODIN) 5-325 MG per tablet Take 1 tablet by mouth 2 (two) times daily as needed for pain.  60 tablet  0  . LORazepam (ATIVAN) 0.5 MG tablet Take 1 tablet (0.5 mg total) by mouth 2 (two) times daily as needed. For anxiety  60 tablet  1  . methotrexate (RHEUMATREX) 2.5 MG tablet 25 mg once a week. On wednesday Caution:Chemotherapy. Protect from light.      . Nebulizers (COMPRESSOR/NEBULIZER) MISC Use up to 4 times daily when needed  1 each  0  . omeprazole (PRILOSEC) 20 MG capsule Take 20 mg by mouth daily.       Marland Kitchen buPROPion (WELLBUTRIN SR) 150 MG 12 hr tablet Take 1 tablet (150 mg total) by mouth 2 (two) times daily.  60 tablet  5  . ciprofloxacin (CIPRO) 500 MG tablet Take 1 tablet (500 mg  total) by mouth 2 (two) times daily.  20 tablet  0  . [DISCONTINUED] doxepin (SINEQUAN) 10 MG capsule 1-2 at bedtime       . [DISCONTINUED] fexofenadine (ALLEGRA) 180 MG tablet Take 180 mg by mouth daily.         No current facility-administered medications on file prior to visit.   Review of Systems  Constitutional: Negative for unexpected weight change, or unusual diaphoresis  HENT: Negative for tinnitus.   Eyes: Negative for photophobia and visual disturbance.  Respiratory: Negative for choking and stridor.   Gastrointestinal: Negative for vomiting and blood in stool.  Genitourinary: Negative for hematuria and decreased urine volume.  Musculoskeletal: Negative for acute joint swelling Skin: Negative for color change and wound.  Neurological: Negative for tremors and numbness other than noted  Psychiatric/Behavioral: Negative for decreased concentration or  hyperactivity.       Objective:   Physical Exam BP 112/68  Pulse 101  Temp(Src) 97.1 F (36.2 C) (Oral)  Ht 5\' 5"  (1.651 m)  Wt 279 lb 6 oz (126.724 kg)  BMI 46.49 kg/m2  SpO2 95% VS noted, mild ill Constitutional: Pt appears well-developed and well-nourished.  HENT: Head: NCAT.  Right Ear: External ear normal.  Left Ear: External ear normal.  Eyes: Conjunctivae and EOM are normal. Pupils are equal, round, and reactive to light.  Bilat tm's with mild erythema.  Max sinus areas mild tender.  Pharynx with mild erythema, no exudate Neck: Normal range of motion. Neck supple.  Cardiovascular: Normal rate and regular rhythm.   Pulmonary/Chest: Effort normal and breath sounds mild decreased, few wheeze bilat Abd:  Soft, NT, non-distended, + BS Neurological: Pt is alert. Not confused  Skin: Skin is warm. No erythema.  Psychiatric: Pt behavior is normal. Thought content normal. not depressed affect    Assessment & Plan:

## 2012-12-17 NOTE — Patient Instructions (Signed)
**Note De-Identified Brinton Obfuscation** You had the steroid shot today Please take all new medication as prescribed- the antibiotic Please continue all other medications as before, and refills have been done if requested. Please keep your appointments with your specialists as you have planned  - Dr Maple Hudson

## 2012-12-17 NOTE — Assessment & Plan Note (Addendum)
**Note De-Identified Hippe Obfuscation** Mild, for depomedrol IM,  to f/u any worsening symptoms or concerns, I think can hold on oral at home as is mild today

## 2012-12-17 NOTE — Assessment & Plan Note (Signed)
**Note De-Identified Franko Obfuscation** stable overall by history and exam, recent data reviewed with pt, and pt to continue medical treatment as before,  to f/u any worsening symptoms or concerns Lab Results  Component Value Date   WBC 11.5* 11/07/2012   HGB 15.2* 11/07/2012   HCT 45.2 11/07/2012   PLT 193.0 11/07/2012   GLUCOSE 211* 11/07/2012   CHOL 264* 04/25/2012   TRIG 269.0* 04/25/2012   HDL 29.10* 04/25/2012   LDLDIRECT 181.5 04/25/2012   ALT 33 11/07/2012   AST 31 11/07/2012   NA 140 11/07/2012   K 4.1 11/07/2012   CL 100 11/07/2012   CREATININE 0.7 11/07/2012   BUN 12 11/07/2012   CO2 31 11/07/2012   TSH 1.54 04/25/2012   HGBA1C 6.0 04/25/2012

## 2012-12-19 ENCOUNTER — Other Ambulatory Visit: Payer: Self-pay

## 2012-12-19 ENCOUNTER — Ambulatory Visit: Payer: Medicare Other | Admitting: Internal Medicine

## 2012-12-19 MED ORDER — HYDROCODONE-ACETAMINOPHEN 5-325 MG PO TABS: 1.0000 | ORAL_TABLET | Freq: Two times a day (BID) | ORAL | Status: DC | PRN

## 2012-12-19 NOTE — Telephone Encounter (Signed)
**Note De-identified Rohwer Obfuscation** Faxed hardcopy to pharmacy. 

## 2012-12-19 NOTE — Telephone Encounter (Signed)
**Note De-identified Prestwood Obfuscation** Done hardcopy to robin  

## 2013-01-15 ENCOUNTER — Encounter: Payer: Self-pay | Admitting: Internal Medicine

## 2013-01-16 ENCOUNTER — Telehealth: Payer: Self-pay

## 2013-01-16 MED ORDER — HYDROCODONE-ACETAMINOPHEN 5-325 MG PO TABS: 1.0000 | ORAL_TABLET | Freq: Two times a day (BID) | ORAL | Status: DC | PRN

## 2013-01-16 NOTE — Telephone Encounter (Signed)
**Note De-identified Aikens Obfuscation** Done hardcopy to robin  

## 2013-01-16 NOTE — Telephone Encounter (Signed)
**Note De-Identified Pasillas Obfuscation** Faxed hardcopy of hydrocodone to [facility] as requested from patient email from [facility].

## 2013-01-30 ENCOUNTER — Ambulatory Visit (INDEPENDENT_AMBULATORY_CARE_PROVIDER_SITE_OTHER): Payer: Medicare Other | Admitting: Internal Medicine

## 2013-01-30 ENCOUNTER — Ambulatory Visit
Admission: RE | Admit: 2013-01-30 | Discharge: 2013-01-30 | Disposition: A | Discharge status: Home / Self Care | Payer: Medicare Other | Source: Ambulatory Visit | Attending: Internal Medicine | Admitting: Internal Medicine

## 2013-01-30 ENCOUNTER — Encounter: Payer: Self-pay | Admitting: Internal Medicine

## 2013-01-30 ENCOUNTER — Ambulatory Visit (INDEPENDENT_AMBULATORY_CARE_PROVIDER_SITE_OTHER)
Admission: RE | Admit: 2013-01-30 | Discharge: 2013-01-30 | Disposition: A | Discharge status: Home / Self Care | Payer: Medicare Other | Source: Ambulatory Visit | Attending: Internal Medicine | Admitting: Internal Medicine

## 2013-01-30 VITALS — BP 128/70 | HR 100 | Ht 64.5 in | Wt 282.6 lb

## 2013-01-30 DIAGNOSIS — M25569 Pain in unspecified knee: Secondary | ICD-10-CM

## 2013-01-30 DIAGNOSIS — M79604 Pain in right leg: Secondary | ICD-10-CM

## 2013-01-30 DIAGNOSIS — M79609 Pain in unspecified limb: Secondary | ICD-10-CM

## 2013-01-30 DIAGNOSIS — M25561 Pain in right knee: Secondary | ICD-10-CM

## 2013-01-30 DIAGNOSIS — G4733 Obstructive sleep apnea (adult) (pediatric): Secondary | ICD-10-CM

## 2013-01-30 DIAGNOSIS — E662 Morbid (severe) obesity with alveolar hypoventilation: Secondary | ICD-10-CM

## 2013-01-30 DIAGNOSIS — J449 Chronic obstructive pulmonary disease, unspecified: Secondary | ICD-10-CM

## 2013-01-30 DIAGNOSIS — F17200 Nicotine dependence, unspecified, uncomplicated: Secondary | ICD-10-CM

## 2013-01-30 MED ORDER — VARENICLINE TARTRATE 0.5 MG X 11 & 1 MG X 42 PO MISC: ORAL | Status: DC

## 2013-01-30 NOTE — Progress Notes (Signed)
**Note De-Identified Karwowski Obfuscation** Patient ID: Hailey Hernandez, female    DOB: 1956/03/07, 57 y.o.   MRN: [id number]  HPI 42 female with known hx of chronic bronchitis , OSA/OHS , active smoker   02/05/11- 55 yoFwith chronic bronchitis, tobacco use, OSA, obesity hypoventilation syndrome. Last here December 15, 2010. Since then has continued Symbicort. Comes with sister today. Sleeping 2 nights ago- woke smothered. Had to pull off CPAP. She gradually cleared over several minutes. Not clear if she refluxed, had mucus plug or ??. Today is typical with no residual. Chest congestion varies day to day. Says cough better, not coughing up anything purulent or bloody. Still averaging 1.5 PPD, down from 3 PPD.  Compliant with CPAP- needs new mask.   07/12/2011 Acute OV  Presents for an acute office visit. Complains pain in right side x 3 days--more painful with cough and movement.  cough with green sputum--. She has been sick for 2 weeks, cough, congestion , drainage. Was exposed to URI symptoms at hospital . Started with lots of drippy nose and drainge.  No fever. Initially phoned in a zpack with no improvement. Then started on Omnicef 4 days ago.  Does have some wheezing . Now pain with inspiration esp along right lateral ribs. Tender to touch and with movement. No rash or fever.   08/10/11- 24 yoFwith chronic bronchitis, tobacco use, OSA, obesity hypoventilation syndrome. Several family members here.Marland Kitchen PCP Dr Edrick Oh She describes persistent ear ache on the right, nasal discharge from the left with little bit of bloody mucus. Distal coughing productively with light yellow sputum, no blood. Questions low-grade fever mainly because she sometimes feels chilly. Smoking is down from 3 packs a day to a little under one pack per day. I encouraged her to keep working at. She gives history of a cholesteatoma and told never to put water in her right ear.  12/10/11- 11 yoFwith chronic bronchitis, tobacco use, OSA, obesity hypoventilation syndrome.  Sister  here. Her sister Hailey Hernandez died in her sleep of end-stage COPD complicated by alpha-1 antitrypsin deficiency. We had called in Augmentin which did help some. Complains now of sinus pressure, blowing bloody scabs from her nose, postnasal drip, cough productive of thick mucus. Denies fever, swollen glands, headache. She has decided that she finally wants to quit smoking. Has reduced from 3 packs per day habit to one pack per day. We discussed support, medical reasons and available techniques and answered her questions.  05/15/12-55 yoFwith chronic bronchitis, tobacco use, OSA, obesity hypoventilation syndrome.  Sister here.  Pt states breathing has been "okay" since last seen. Pt c/o increased SOB and allergy trouble with HA . Pt denies prod cough, wheezing and chest tightness.. unhappy with [name] and wants to change to [name]. CPAP 13/ O2 2L/ [facility] Parking requested  COPD assessment test (CAT) score 14/40 CXR 07/12/11- IMPRESSION:  Bronchitic changes. Left base atelectasis.  Original Report Authenticated By: Raelyn Number, M.D.   09/15/12- 56 yoFwith chronic bronchitis, tobacco use, OSA, obesity hypoventilation syndrome.  Sister here. Follows for: pt states last couple of weeks increase sob,productive cough.chest tightness.denies any wheezing.  For 2 weeks her eyes had been itching and she has had frontal headache, watery rhinorrhea and a little bit of epistaxis. Scratchy throat. Questions low-grade fever. Treating with antihistamines. She continues oxygen 2-3 L per minute/ Apothe[facility]ary. Continues good compliance with CPAP 13+ oxygen 2 L at night. She is making no effort to stop smoking despite our counseling. CXR 05/15/12-reviewed IMPRESSION:  Stable **Note De-Identified Malcolm Obfuscation** bronchitic changes and interstitial prominence.  Lingular scarring or atelectasis.  Borderline cardiomegaly.  Original Report Authenticated By: Cyndie Chime, M.D.   01/30/13-  56 yoFwith chronic bronchitis, tobacco use, OSA,  obesity hypoventilation syndrome.  Sister here. FOLLOWS FOR: states she is having increased SOB Home oxygen [name] using 3 L at home, 2 L portable. CPAP 13+ oxygen 2 L for sleep Expresses interest in quitting smoking and asks for Chantix which we discussed. Productive cough with some chest congestion, white or yellow sputum. No blood, no chest pain. New problem-3 weeks, aching right upper tibia anteriorly keeps her awake at hurts to bear weight. CXR 11/07/12 IMPRESSION:  Stable chest x-ray with cardiomegaly. No active lung disease.  Original Report Authenticated By: Dwyane Dee, M.D.  Review of Systems-see HPI Constitutional:   No-   weight loss, night sweats, fevers, chills, fatigue, lassitude. HEENT:   + headaches, difficulty swallowing, tooth/dental problems, sore throat,       No-  sneezing, itching,  ear ache,  +nasal congestion, +post nasal drip,  CV:  No-   chest pain, orthopnea, PND, swelling in lower extremities, anasarca,   dizziness, palpitations Resp: No- acute  shortness of breath with exertion or at rest.              +   productive cough,  + non-productive cough,  No- coughing up of blood.              No-   change in color of mucus.  No- wheezing.   Skin: psoriasis GI:  No-   heartburn, indigestion, abdominal pain, nausea, vomiting,  GU: No-    MS:  + joint pain or swelling. . Neuro-     nothing unusual Psych:  No- change in mood or affect. No depression or anxiety.  No memory loss.   Objective:   Physical Exam General- Alert, Oriented, Affect-appropriate, Distress- none acute, obese. On 3L O2 Skin- +extensive psoriasis,  Lymphadenopathy- none Head- atraumatic            Eyes- Gross vision intact, PERRLA, conjunctivae clear secretions            Ears- Hearing, canals- cerumen/ debris R            Nose- blood/ scabs left nostril, no-Septal dev, mucus, polyps, erosion, perforation             Throat- Mallampati II , mucosa clear , drainage- none, tonsils-  atrophic. Mouth breathing Neck- flexible , trachea midline, no stridor , thyroid nl, carotid no bruit Chest - symmetrical excursion , unlabored           Heart/CV- RRR , no murmur , no gallop  , no rub, nl s1 s2                           - JVD- none , edema- none, stasis changes+ bilateral, varices- none           Lung- +decreased w/ loose cough, + light rattle. , dullness-none, rub- none           Chest wall-  Abd-  Br/ Gen/ Rectal- Not done, not indicated Extrem- +apparent lipoma right medial knee with stasis changes but no edema.? Tenderness Neuro- grossly intact to observation

## 2013-01-30 NOTE — Patient Instructions (Addendum)
**Note De-Identified Flagg Obfuscation** Script sent for Chantix.  I am pleased to help you stop smoking!  Order- xray Right lower leg   Dx leg pain

## 2013-02-02 ENCOUNTER — Telehealth: Payer: Self-pay | Admitting: Internal Medicine

## 2013-02-02 NOTE — Telephone Encounter (Signed)
**Note De-Identified Borras Obfuscation** Pt is aware of knee/leg xray results.

## 2013-02-02 NOTE — Telephone Encounter (Signed)
**Note De-Identified Massingale Obfuscation** Pt's daughter Lawson Fiscal returned call from [place]. (same #- pt's home phone). Hailey Hernandez

## 2013-02-10 ENCOUNTER — Encounter: Payer: Self-pay | Admitting: Internal Medicine

## 2013-02-10 DIAGNOSIS — M25569 Pain in unspecified knee: Secondary | ICD-10-CM | POA: Insufficient documentation

## 2013-02-10 NOTE — Assessment & Plan Note (Signed)
**Note De-Identified Przybysz Obfuscation** Continued good compliance and control but unsuccessful weight loss.

## 2013-02-10 NOTE — Assessment & Plan Note (Signed)
**Note De-Identified Blackstock Obfuscation** No recent crisis. She remains in hypoxic respiratory failure

## 2013-02-10 NOTE — Assessment & Plan Note (Signed)
**Note De-Identified Guthridge Obfuscation** Her abdomen visibly crowds diaphragm excursion when sitting

## 2013-02-10 NOTE — Assessment & Plan Note (Signed)
**Note De-Identified Milke Obfuscation** We are certainly supporting any effort to stop smoking. Plan-Chantix with discussion

## 2013-02-10 NOTE — Assessment & Plan Note (Signed)
**Note De-Identified Mcgivern Obfuscation** She seems to be indicating an area of tenderness just distal to the right knee joint. Plan-x-ray leg

## 2013-02-25 ENCOUNTER — Other Ambulatory Visit: Payer: Self-pay

## 2013-02-25 MED ORDER — LORAZEPAM 0.5 MG PO TABS: 0.5000 mg | ORAL_TABLET | Freq: Two times a day (BID) | ORAL | Status: DC | PRN

## 2013-02-25 NOTE — Telephone Encounter (Signed)
**Note De-identified Musto Obfuscation** Done hardcopy to robin  

## 2013-02-26 NOTE — Telephone Encounter (Signed)
**Note De-Identified Hartgrove Obfuscation** Faxed hardcopy to [facility]

## 2013-03-12 ENCOUNTER — Other Ambulatory Visit: Payer: Self-pay | Admitting: Internal Medicine

## 2013-03-26 ENCOUNTER — Encounter: Payer: Self-pay | Admitting: Internal Medicine

## 2013-03-26 ENCOUNTER — Other Ambulatory Visit: Payer: Self-pay | Admitting: Internal Medicine

## 2013-03-27 ENCOUNTER — Encounter: Payer: Self-pay | Admitting: Internal Medicine

## 2013-03-27 NOTE — Telephone Encounter (Signed)
**Note De-Identified Gudiel Obfuscation** This time, we should see you.  I will have the office call.   Harriett Sine to call, can see me or Rene Kocher

## 2013-04-02 ENCOUNTER — Other Ambulatory Visit: Payer: Self-pay | Admitting: Internal Medicine

## 2013-05-08 ENCOUNTER — Other Ambulatory Visit (INDEPENDENT_AMBULATORY_CARE_PROVIDER_SITE_OTHER): Payer: Medicare Other

## 2013-05-08 ENCOUNTER — Ambulatory Visit (INDEPENDENT_AMBULATORY_CARE_PROVIDER_SITE_OTHER): Payer: Medicare Other | Admitting: Internal Medicine

## 2013-05-08 ENCOUNTER — Encounter: Payer: Self-pay | Admitting: Internal Medicine

## 2013-05-08 VITALS — BP 140/82 | HR 106 | Temp 97.3°F | Ht 65.0 in | Wt 277.1 lb

## 2013-05-08 DIAGNOSIS — R7302 Impaired glucose tolerance (oral): Secondary | ICD-10-CM

## 2013-05-08 DIAGNOSIS — M7702 Medial epicondylitis, left elbow: Secondary | ICD-10-CM | POA: Insufficient documentation

## 2013-05-08 DIAGNOSIS — R609 Edema, unspecified: Secondary | ICD-10-CM

## 2013-05-08 DIAGNOSIS — M7700 Medial epicondylitis, unspecified elbow: Secondary | ICD-10-CM

## 2013-05-08 DIAGNOSIS — R7309 Other abnormal glucose: Secondary | ICD-10-CM

## 2013-05-08 DIAGNOSIS — I10 Essential (primary) hypertension: Secondary | ICD-10-CM

## 2013-05-08 HISTORY — DX: Impaired glucose tolerance (oral): R73.02

## 2013-05-08 LAB — CBC WITH DIFFERENTIAL/PLATELET
Basophils Absolute: 0.1 10*3/uL (ref 0.0–0.1)
Eosinophils Relative: 2.2 % (ref 0.0–5.0)
Monocytes Absolute: 0.8 10*3/uL (ref 0.1–1.0)
Monocytes Relative: 7.3 % (ref 3.0–12.0)
Neutrophils Relative %: 72.9 % (ref 43.0–77.0)
Platelets: 234 10*3/uL (ref 150.0–400.0)
RDW: 16.8 % — ABNORMAL HIGH (ref 11.5–14.6)
WBC: 10.5 10*3/uL (ref 4.5–10.5)

## 2013-05-08 LAB — HEPATIC FUNCTION PANEL
ALT: 21 U/L (ref 0–35)
AST: 33 U/L (ref 0–37)
Bilirubin, Direct: 0.1 mg/dL (ref 0.0–0.3)
Total Bilirubin: 0.8 mg/dL (ref 0.3–1.2)

## 2013-05-08 LAB — BASIC METABOLIC PANEL
BUN: 10 mg/dL (ref 6–23)
Calcium: 9.4 mg/dL (ref 8.4–10.5)
Creatinine, Ser: 0.8 mg/dL (ref 0.4–1.2)
GFR: 83.99 mL/min (ref >60.00)

## 2013-05-08 LAB — HEMOGLOBIN A1C: Hgb A1c MFr Bld: 6.6 % — ABNORMAL HIGH (ref 4.6–6.5)

## 2013-05-08 MED ORDER — HYDROCODONE-ACETAMINOPHEN 5-325 MG PO TABS: 1.0000 | ORAL_TABLET | Freq: Two times a day (BID) | ORAL | Status: DC | PRN

## 2013-05-08 NOTE — Assessment & Plan Note (Addendum)
**Note De-Identified Sherfield Obfuscation** Asked to re-start the lasix at 20 bid for 5 days, then 20 qd after, check daily wts, f/u 3 wks, for labs today, to avoid drinking increased fluids

## 2013-05-08 NOTE — Assessment & Plan Note (Signed)
**Note De-Identified Fate Obfuscation** Mild, tylneol prn,  to f/u any worsening symptoms or concerns

## 2013-05-08 NOTE — Assessment & Plan Note (Signed)
**Note De-Identified Lourenco Obfuscation** Mild to mod, for a1c with labs today,  to f/u any worsening symptoms or concerns

## 2013-05-08 NOTE — Patient Instructions (Signed)
**Note De-Identified [name] Obfuscation** Please re-start the lasix at 20 mg twice per day for 5 days, then 20 mg per day after that Please check your weight at home every morning Please continue all other medications as before, and refills have been done if requested - the pain medication Please go to the LAB in the Basement (turn left off the elevator) for the tests to be done today You will be contacted by phone if any changes need to be made immediately.  Otherwise, you will receive a letter about your results with an explanation, but please check with MyChart first.  Please remember to sign up for My Chart if you have not done so, as this will be important to you in the future with finding out test results, communicating by private email, and scheduling acute appointments online when needed.  Please keep your appointments with your specialists as you have planned  Please return in 3 weeks

## 2013-05-08 NOTE — Assessment & Plan Note (Signed)
**Note De-Identified Shuffield Obfuscation** stable overall by history and exam, recent data reviewed with pt, and pt to continue medical treatment as before,  to f/u any worsening symptoms or concerns BP Readings from Last 3 Encounters:  05/08/13 140/82  01/30/13 128/70  12/17/12 112/68

## 2013-05-08 NOTE — Progress Notes (Signed)
**Note De-Identified Stlouis Obfuscation** Subjective:    Patient ID: Hailey Hernandez, female    DOB: 09-Jan-1956, 57 y.o.   MRN: [id number]  HPI  Here to f/u, with c/o pain/tenderness to left medial elbow worse x 1 wk, does not recall overuse or trauma, no fever or hx of same, though recalls an episode of tennis elbow one time.  Better to rest, constant, mild only.  Has also 1 wk increased bilat LE pain/swelling to knees, drinking lots of fluids as her friends have suggested and not taking lasix, leg elevation, compression stockings.  Has not been checking wts but could do so.  Pt denies chest pain, increased sob or doe, wheezing, orthopnea, PND, increased LE swelling, palpitations, dizziness or syncope, except for the above Past Medical History  Diagnosis Date  . Tachyarrhythmia   . Tobacco abuse   . Chronic bronchitis   . History of thyroid cancer   . Rhinitis   . Polymyalgia   . Diverticulitis   . DJD (degenerative joint disease)     spine  . Arthritis   . COPD (chronic obstructive pulmonary disease)   . Hyperlipidemia   . GERD (gastroesophageal reflux disease)   . Fibromyalgia   . Arthritis     bil legs  . Asthma   . Glaucoma   . Allergy   . Anxiety disorder   . Depression   . Cancer   . Complication of anesthesia     20 yrs ago turned blue after surgery  . HTN (hypertension)     no longer on BP medication  . Shortness of breath   . OSA (obstructive sleep apnea)     CPAP  last sleep study 8-10 yr.ag0  . Psoriasis   . Impaired glucose tolerance 05/08/2013   Past Surgical History  Procedure Laterality Date  . Total abdominal hysterectomy    . Cholecystectomy    . Appendectomy    . Carpal tunnel release    . Left shoulder spurs    . Orif right lower leg    . Cholesteatoma excision      right ear  . Carpal tunnel release  10/30/2011    Procedure: CARPAL TUNNEL RELEASE;  Surgeon: Nicki Reaper, MD;  Location: [facility];  Service: Orthopedics;  Laterality: Right;  and mass excision  . Partial  throidectomy      reports that she has been smoking Cigarettes.  She has a 67.5 pack-year smoking history. She has never used smokeless tobacco. She reports that she does not drink alcohol or use illicit drugs. family history includes Allergies in her sister; Alpha-1 antitrypsin deficiency in her sisters; Asthma in her sister; Cancer in her brother, mother, and sister; Emphysema in her sisters; Heart disease in her father and mother; and Tuberculosis in her other. Allergies  Allergen Reactions  . Milnacipran     REACTION: dizzy  . Tramadol     Did not work   Current Outpatient Prescriptions on File Prior to Visit  Medication Sig Dispense Refill  . albuterol (PROVENTIL) (2.5 MG/3ML) 0.083% nebulizer solution Take 3 mLs (2.5 mg total) by nebulization every 4 (four) hours as needed for wheezing or shortness of breath. DX: 496  180 vial  prn  . budesonide-formoterol (SYMBICORT) 160-4.5 MCG/ACT inhaler Inhale 2 puffs into the lungs daily.       . clotrimazole (LOTRIMIN) 1 % cream APPLY AS DIRECTED BY YOUR PHYSICIAN.     -FOR TOPICAL USE-  30 g  1  . folic acid ( **Note De-Identified Gallentine Obfuscation** FOLVITE) 1 MG tablet Take 1 mg by mouth daily.       . furosemide (LASIX) 20 MG tablet Take 1 tablet (20 mg total) by mouth daily as needed.  90 tablet  3  . LORazepam (ATIVAN) 0.5 MG tablet Take 1 tablet (0.5 mg total) by mouth 2 (two) times daily as needed. For anxiety  60 tablet  2  . methotrexate (RHEUMATREX) 2.5 MG tablet 25 mg once a week. On wednesday Caution:Chemotherapy. Protect from light.      . Nebulizers (COMPRESSOR/NEBULIZER) MISC Use up to 4 times daily when needed  1 each  0  . omeprazole (PRILOSEC) 20 MG capsule Take 20 mg by mouth daily.       Marland Kitchen atorvastatin (LIPITOR) 20 MG tablet Take 1 tablet (20 mg total) by mouth daily.  90 tablet  3  . [DISCONTINUED] doxepin (SINEQUAN) 10 MG capsule 1-2 at bedtime       . [DISCONTINUED] fexofenadine (ALLEGRA) 180 MG tablet Take 180 mg by mouth daily.         No current  facility-administered medications on file prior to visit.   Review of Systems  Constitutional: Negative for unexpected weight change, or unusual diaphoresis  HENT: Negative for tinnitus.   Eyes: Negative for photophobia and visual disturbance.  Respiratory: Negative for choking and stridor.   Gastrointestinal: Negative for vomiting and blood in stool.  Genitourinary: Negative for hematuria and decreased urine volume.  Musculoskeletal: Negative for acute joint swelling Skin: Negative for color change and wound.  Neurological: Negative for tremors and numbness other than noted  Psychiatric/Behavioral: Negative for decreased concentration or  hyperactivity.       Objective:   Physical Exam BP 140/82  Pulse 106  Temp(Src) 97.3 F (36.3 C) (Oral)  Ht 5\' 5"  (1.651 m)  Wt 277 lb 2 oz (125.703 kg)  BMI 46.12 kg/m2  SpO2 91% VS noted,  Not ill appearing, on home o2 Constitutional: Pt appears well-developed and well-nourished. /morbid obese HENT: Head: NCAT.  Right Ear: External ear normal.  Left Ear: External ear normal.  Eyes: Conjunctivae and EOM are normal. Pupils are equal, round, and reactive to light.  Neck: Normal range of motion. Neck supple.  Cardiovascular: Normal rate and regular rhythm.   Pulmonary/Chest: Effort normal and breath sounds decreased, no rales or wheezing.  Neurological: Pt is alert. Not confused  Skin: Skin is warm. 2-3+ edema to knees without erythema, ulcers Psychiatric: Pt behavior is normal. Thought content normal.  Has mild tender over medial epicondylar area - LUE o/w neurovasc intact    Assessment & Plan:

## 2013-05-11 ENCOUNTER — Encounter: Payer: Self-pay | Admitting: Internal Medicine

## 2013-06-05 ENCOUNTER — Encounter: Payer: Self-pay | Admitting: Internal Medicine

## 2013-06-05 ENCOUNTER — Encounter (INDEPENDENT_AMBULATORY_CARE_PROVIDER_SITE_OTHER): Payer: Medicare Other

## 2013-06-05 ENCOUNTER — Ambulatory Visit (INDEPENDENT_AMBULATORY_CARE_PROVIDER_SITE_OTHER): Payer: Medicare Other | Admitting: Internal Medicine

## 2013-06-05 ENCOUNTER — Encounter: Payer: Self-pay | Admitting: Family Medicine

## 2013-06-05 ENCOUNTER — Ambulatory Visit (INDEPENDENT_AMBULATORY_CARE_PROVIDER_SITE_OTHER): Payer: Medicare Other | Admitting: Family Medicine

## 2013-06-05 VITALS — BP 136/80 | HR 97 | Ht 64.5 in | Wt 277.0 lb

## 2013-06-05 VITALS — BP 122/70 | HR 109 | Wt 279.0 lb

## 2013-06-05 VITALS — BP 122/70 | HR 109 | Temp 97.8°F | Ht 65.0 in | Wt 279.4 lb

## 2013-06-05 DIAGNOSIS — M79609 Pain in unspecified limb: Secondary | ICD-10-CM

## 2013-06-05 DIAGNOSIS — M79602 Pain in left arm: Secondary | ICD-10-CM

## 2013-06-05 DIAGNOSIS — M25569 Pain in unspecified knee: Secondary | ICD-10-CM

## 2013-06-05 DIAGNOSIS — R609 Edema, unspecified: Secondary | ICD-10-CM

## 2013-06-05 DIAGNOSIS — M7702 Medial epicondylitis, left elbow: Secondary | ICD-10-CM

## 2013-06-05 DIAGNOSIS — M25561 Pain in right knee: Secondary | ICD-10-CM

## 2013-06-05 DIAGNOSIS — M79604 Pain in right leg: Secondary | ICD-10-CM | POA: Insufficient documentation

## 2013-06-05 DIAGNOSIS — J449 Chronic obstructive pulmonary disease, unspecified: Secondary | ICD-10-CM

## 2013-06-05 DIAGNOSIS — G4733 Obstructive sleep apnea (adult) (pediatric): Secondary | ICD-10-CM

## 2013-06-05 DIAGNOSIS — J42 Unspecified chronic bronchitis: Secondary | ICD-10-CM

## 2013-06-05 DIAGNOSIS — M7989 Other specified soft tissue disorders: Secondary | ICD-10-CM

## 2013-06-05 DIAGNOSIS — M7700 Medial epicondylitis, unspecified elbow: Secondary | ICD-10-CM

## 2013-06-05 DIAGNOSIS — F17200 Nicotine dependence, unspecified, uncomplicated: Secondary | ICD-10-CM

## 2013-06-05 DIAGNOSIS — R600 Localized edema: Secondary | ICD-10-CM

## 2013-06-05 DIAGNOSIS — Z23 Encounter for immunization: Secondary | ICD-10-CM

## 2013-06-05 MED ORDER — POTASSIUM CHLORIDE ER 10 MEQ PO TBCR: 10.0000 meq | EXTENDED_RELEASE_TABLET | Freq: Two times a day (BID) | ORAL | Status: DC

## 2013-06-05 MED ORDER — PREDNISONE 20 MG PO TABS: 40.0000 mg | ORAL_TABLET | Freq: Every day | ORAL | Status: DC

## 2013-06-05 MED ORDER — GABAPENTIN 300 MG PO CAPS: ORAL_CAPSULE | ORAL | Status: DC

## 2013-06-05 MED ORDER — FUROSEMIDE 40 MG PO TABS: 40.0000 mg | ORAL_TABLET | Freq: Every day | ORAL | Status: DC

## 2013-06-05 NOTE — Patient Instructions (Addendum)
**Note De-Identified Lohn Obfuscation** Please take all new medication as prescribed - the gabapentin Ok to increase the lasix to 40 mg per day, and take potassium once per day with this You will be contacted regarding the referral for: orthopedic for the knees, and a Lower Extremity Arterial Doppler test for circulation in the legs Please see Dr Katrinka Blazing in our office Now for the left elbow  Please continue all other medications as before, and refills have been done if requested. Please have the pharmacy call with any other refills you may need.  Please remember to sign up for My Chart if you have not done so, as this will be important to you in the future with finding out test results, communicating by private email, and scheduling acute appointments online when needed.  Please return in 1 month

## 2013-06-05 NOTE — Progress Notes (Signed)
**Note De-Identified Hailey Hernandez Obfuscation** Patient ID: Hailey Hernandez, female    DOB: 1956/03/07, 57 y.o.   MRN: [id number]  HPI 42 female with known hx of chronic bronchitis , OSA/OHS , active smoker   02/05/11- 55 yoFwith chronic bronchitis, tobacco use, OSA, obesity hypoventilation syndrome. Last here December 15, 2010. Since then has continued Symbicort. Comes with sister today. Sleeping 2 nights ago- woke smothered. Had to pull off CPAP. She gradually cleared over several minutes. Not clear if she refluxed, had mucus plug or ??. Today is typical with no residual. Chest congestion varies day to day. Says cough better, not coughing up anything purulent or bloody. Still averaging 1.5 PPD, down from 3 PPD.  Compliant with CPAP- needs new mask.   07/12/2011 Acute OV  Presents for an acute office visit. Complains pain in right side x 3 days--more painful with cough and movement.  cough with green sputum--. She has been sick for 2 weeks, cough, congestion , drainage. Was exposed to URI symptoms at hospital . Started with lots of drippy nose and drainge.  No fever. Initially phoned in a zpack with no improvement. Then started on Omnicef 4 days ago.  Does have some wheezing . Now pain with inspiration esp along right lateral ribs. Tender to touch and with movement. No rash or fever.   08/10/11- 24 yoFwith chronic bronchitis, tobacco use, OSA, obesity hypoventilation syndrome. Several family members here.Marland Kitchen PCP Dr Edrick Oh She describes persistent ear ache on the right, nasal discharge from the left with little bit of bloody mucus. Distal coughing productively with light yellow sputum, no blood. Questions low-grade fever mainly because she sometimes feels chilly. Smoking is down from 3 packs a day to a little under one pack per day. I encouraged her to keep working at. She gives history of a cholesteatoma and told never to put water in her right ear.  12/10/11- 11 yoFwith chronic bronchitis, tobacco use, OSA, obesity hypoventilation syndrome.  Sister  here. Her sister Hailey Hernandez died in her sleep of end-stage COPD complicated by alpha-1 antitrypsin deficiency. We had called in Augmentin which did help some. Complains now of sinus pressure, blowing bloody scabs from her nose, postnasal drip, cough productive of thick mucus. Denies fever, swollen glands, headache. She has decided that she finally wants to quit smoking. Has reduced from 3 packs per day habit to one pack per day. We discussed support, medical reasons and available techniques and answered her questions.  05/15/12-55 yoFwith chronic bronchitis, tobacco use, OSA, obesity hypoventilation syndrome.  Sister here.  Pt states breathing has been "okay" since last seen. Pt c/o increased SOB and allergy trouble with HA . Pt denies prod cough, wheezing and chest tightness.. unhappy with Hailey Hernandez and wants to change to Hailey Hernandez. CPAP 13/ O2 2L/ [facility] Parking requested  COPD assessment test (CAT) score 14/40 CXR 07/12/11- IMPRESSION:  Bronchitic changes. Left base atelectasis.  Original Report Authenticated By: Raelyn Number, M.D.   09/15/12- 56 yoFwith chronic bronchitis, tobacco use, OSA, obesity hypoventilation syndrome.  Sister here. Follows for: pt states last couple of weeks increase sob,productive cough.chest tightness.denies any wheezing.  For 2 weeks her eyes had been itching and she has had frontal headache, watery rhinorrhea and a little bit of epistaxis. Scratchy throat. Questions low-grade fever. Treating with antihistamines. She continues oxygen 2-3 L per minute/ Apothecary. Continues good compliance with CPAP 13+ oxygen 2 L at night. She is making no effort to stop smoking despite our counseling. CXR 05/15/12-reviewed IMPRESSION:  Stable **Note De-Identified Altemose Obfuscation** bronchitic changes and interstitial prominence.  Lingular scarring or atelectasis.  Borderline cardiomegaly.  Original Report Authenticated By: Cyndie Chime, M.D.   01/30/13-  56 yoFwith chronic bronchitis, tobacco use, OSA,  obesity hypoventilation syndrome.  Sister here. FOLLOWS FOR: states she is having increased SOB Home oxygen [facility] using 3 L at home, 2 L portable. CPAP 13+ oxygen 2 L for sleep Expresses interest in quitting smoking and asks for Chantix which we discussed. Productive cough with some chest congestion, white or yellow sputum. No blood, no chest pain. New problem-3 weeks, aching right upper tibia anteriorly keeps her awake at hurts to bear weight. CXR 11/07/12 IMPRESSION:  Stable chest x-ray with cardiomegaly. No active lung disease.  Original Report Authenticated By: Dwyane Dee, M.D.  06/05/13- 57 yoFwith chronic bronchitis, tobacco use, OSA, obesity hypoventilation syndrome.  Sister here. FOLLOWS FOR: Breathing unchanged since last OV.  Needs to discuss oxygen today Home oxygen [facility] using 3 L at home, 2 L portable. CPAP 13+ oxygen 2 L for sleep Anxious about trying Chantix, but decided to try it- discussed. Failed Wellbutrin.  Review of Systems-see HPI Constitutional:   No-   weight loss, night sweats, fevers, chills, fatigue, lassitude. HEENT:   + headaches, difficulty swallowing, tooth/dental problems, sore throat,       No-  sneezing, itching,  ear ache,  +nasal congestion, +post nasal drip,  CV:  No-   chest pain, orthopnea, PND, swelling in lower extremities, anasarca,   dizziness, palpitations Resp: +  shortness of breath with exertion or at rest.              No- productive cough,  + non-productive cough,  No- coughing up of blood.              No-   change in color of mucus.  No- wheezing.   Skin: psoriasis GI:  No-   heartburn, indigestion, abdominal pain, nausea, vomiting,  GU: No-    MS:  + joint pain or swelling. . Neuro-     nothing unusual Psych:  No- change in mood or affect. No depression or anxiety.  No memory loss.   Objective:   Physical Exam General- Alert, Oriented, Affect-appropriate, Distress- none acute, obese. On 3L O2 sat  91% Skin- +extensive psoriasis,  Lymphadenopathy- none Head- atraumatic            Eyes- Gross vision intact, PERRLA, conjunctivae clear secretions            Ears- Hearing, canals- cerumen/ debris R            Nose- clear, no-Septal dev, mucus, polyps, erosion, perforation             Throat- Mallampati II , mucosa clear , drainage- none, tonsils- atrophic. Mouth breathing Neck- flexible , trachea midline, no stridor , thyroid nl, carotid no bruit Chest - symmetrical excursion , unlabored           Heart/CV- RRR , no murmur , no gallop  , no rub, nl s1 s2                           - JVD- none , edema- none, stasis changes+ bilateral, varices- none           Lung- No- cough, + light rattle. , dullness-none, rub- none           Chest wall-  Abd-  Br/ Gen/ Rectal- Not **Note De-Identified Luppino Obfuscation** done, not indicated Extrem- +apparent lipoma right medial knee with stasis changes but no edema. Neuro- grossly intact to observation

## 2013-06-05 NOTE — Patient Instructions (Addendum)
**Note De-Identified Mells Obfuscation** Order- DME [facility]  Evaluate for for portable concentrator   2-3 L/M    Dx COPD  Ok to try Chantix. Call if I needed to re-write this  Flu vax

## 2013-06-05 NOTE — Progress Notes (Signed)
**Note De-identified Amberg Obfuscation**  **Note De-Identified Thornsberry Obfuscation** I'm seeing this patient by the request  of:  Dr. Jonny Ruiz  CC: Left elbow pain  HPI: Patient is a very pleasant 57 year old female with significant comorbidities including psoriasis coming in with left elbow pain. Patient states that this has been multiple week duration. Patient feels that it is getting worse. Patient does notice some redness in as well as swelling in the area. Patient denies any true injury to the area. Patient notices it more on the medial aspect of the elbow and it is severely tender to palpation. Patient describes the pain as a dull aching sensation with a sharp pain with palpation. States that he can radiate down her arm into her hand. Denies any numbness but states that she feels it is becoming more week. Patient has been told that she's been diagnosed with psoriatic arthritis previously. Patient has tried nothing other than her regular pain medications which includes Norco, and has recently started on Neurontin. Patient denies any neck pain out of the ordinary. Patient describes the pain at 9/10.  Past medical, surgical, family and social history reviewed. Medications reviewed all in the electronic medical record.   Review of Systems: Denies fever, chills, nausea vomiting abdominal pain, dysuria, chest pain, the patient's baseline includes oxygen supplementation for shortness of breath as well as lower extremity swelling .   Objective:    Blood pressure 122/70, pulse 109, weight 279 lb (126.554 kg), SpO2 95.00%.   General: No apparent distress alert and oriented x3 mood and affect normal, dressed appropriately. Patient is obese and wearing oxygen at rest HEENT: Pupils equal, extraocular movements intact Respiratory: Patient's speak in full sentences and does not appear short of breath Cardiovascular: +2 pitting edema of the lower extremities edema, non tender, no erythema significant psoriatic plaque Skin: Significant psoriatic plaques on the upper and lower  extremities. Abdomen: Soft nontender Neuro: Cranial nerves II through XII are intact, neurovascularly intact in all extremities with 2+ DTRs and 2+ pulses. Lymph: No lymphadenopathy of posterior or anterior cervical chain or axillae bilaterally.  Gait normal with good balance and coordination.  MSK: Non tender with full range of motion and good stability and symmetric strength and tone of shoulders, hip, knee and ankles bilaterally.  Elbow: Left Patient does have gross swelling of the medial aspect of the elbow it goes approximately 2 cm proximal to the elbow joint as well as 1 cm distal to. This is severely tender to palpation. Venous hum can be heard in area. Skin does not have any erythema. Neurovascularly intact distally. Patient does have good range of motion of the elbow itself with near full pronation, supination flexion and lacks the last 5 of extension . Strength is full to all of the above directions Stable to varus, valgus stress. Negative moving valgus stress test. Patient is very tender though during exam Ulnar nerve does not sublux. Negative cubital tunnel Tinel's. Neurovascularly intact distally Contralateral side is unremarkable.  Muscular skeletal ultrasound was performed and interpreted by me today. Patient's left elbow in the area of swelling shows the patient has what is consistent with myositis with increasing hypoechoic changes of the muscle architecture. Patient also has what appears to be an artery that may potentially has a calcific deposit in it. This is minimally compressible. Severely tender during exam by patient.  Impression and Recommendations:     This case required medical decision making of moderate complexity.

## 2013-06-05 NOTE — Assessment & Plan Note (Signed)
**Note De-Identified Szeliga Obfuscation** To refer now to Dr Katrinka Blazing - sports med in our office

## 2013-06-05 NOTE — Progress Notes (Signed)
**Note De-Identified Hail Obfuscation** Subjective:    Patient ID: Hailey Hernandez, female    DOB: 1956-01-30, 57 y.o.   MRN: [id number]  HPI  Here with family, to c/o bilat leg pain and swelling mildly increased in past 1 wk, mild to mod, intermittent, burning and sharp, worse to stand up but also to walk, sometime numbness occurs with sitting more than 30 min, nothing seems to make better  Has had NCS/EMG to [facility] in the past, very hesitant to do this test for LE's as she found it painful.  Also has specific bilat knee pain, aching, worse to stand and walk, mod in the past several wks, some swelling sometimes, not seen ortho for approx 2 yrs.  Also has persistent left elbow pain/tender/swelling as per last visit, just not improved with tx.  Current pain meds not working.  Has not tried gabapentin in the past.  Has hx of FMS, but no dx neuropathy or myositis. Psoriasis has seemed to flare more recently as well, cont;s to see derm Past Medical History  Diagnosis Date  . Tachyarrhythmia   . Tobacco abuse   . Chronic bronchitis   . History of thyroid cancer   . Rhinitis   . Polymyalgia   . Diverticulitis   . DJD (degenerative joint disease)     spine  . Arthritis   . COPD (chronic obstructive pulmonary disease)   . Hyperlipidemia   . GERD (gastroesophageal reflux disease)   . Fibromyalgia   . Arthritis     bil legs  . Asthma   . Glaucoma   . Allergy   . Anxiety disorder   . Depression   . Cancer   . Complication of anesthesia     20 yrs ago turned blue after surgery  . HTN (hypertension)     no longer on BP medication  . Shortness of breath   . OSA (obstructive sleep apnea)     CPAP  last sleep study 8-10 yr.ag0  . Psoriasis   . Impaired glucose tolerance 05/08/2013   Past Surgical History  Procedure Laterality Date  . Total abdominal hysterectomy    . Cholecystectomy    . Appendectomy    . Carpal tunnel release    . Left shoulder spurs    . Orif right lower leg    . Cholesteatoma excision      right ear  . Carpal  tunnel release  10/30/2011    Procedure: CARPAL TUNNEL RELEASE;  Surgeon: Nicki Reaper, MD;  Location: [facility];  Service: Orthopedics;  Laterality: Right;  and mass excision  . Partial throidectomy      reports that she has been smoking Cigarettes.  She has a 67.5 pack-year smoking history. She has never used smokeless tobacco. She reports that she does not drink alcohol or use illicit drugs. family history includes Allergies in her sister; Alpha-1 antitrypsin deficiency in her sister and sister; Asthma in her sister; Cancer in her brother, mother, and sister; Emphysema in her sister and sister; Heart disease in her father and mother; Tuberculosis in her other. Allergies  Allergen Reactions  . Milnacipran     REACTION: dizzy  . Tramadol     Did not work   Current Outpatient Prescriptions on File Prior to Visit  Medication Sig Dispense Refill  . albuterol (PROVENTIL) (2.5 MG/3ML) 0.083% nebulizer solution Take 3 mLs (2.5 mg total) by nebulization every 4 (four) hours as needed for wheezing or shortness of breath. DX: 496  180 vial **Note De-Identified Delosreyes Obfuscation** prn  . atorvastatin (LIPITOR) 20 MG tablet Take 1 tablet (20 mg total) by mouth daily.  90 tablet  3  . budesonide-formoterol (SYMBICORT) 160-4.5 MCG/ACT inhaler Inhale 2 puffs into the lungs daily.       . clotrimazole (LOTRIMIN) 1 % cream APPLY AS DIRECTED BY YOUR PHYSICIAN.     -FOR TOPICAL USE-  30 g  1  . folic acid (FOLVITE) 1 MG tablet Take 1 mg by mouth daily.       Marland Kitchen HYDROcodone-acetaminophen (NORCO/VICODIN) 5-325 MG per tablet Take 1 tablet by mouth 2 (two) times daily as needed for pain.  60 tablet  2  . LORazepam (ATIVAN) 0.5 MG tablet Take 1 tablet (0.5 mg total) by mouth 2 (two) times daily as needed. For anxiety  60 tablet  2  . methotrexate (RHEUMATREX) 2.5 MG tablet 25 mg once a week. On wednesday Caution:Chemotherapy. Protect from light.      . Nebulizers (COMPRESSOR/NEBULIZER) MISC Use up to 4 times daily when needed  1 each  0   . omeprazole (PRILOSEC) 20 MG capsule Take 20 mg by mouth daily.       . [DISCONTINUED] doxepin (SINEQUAN) 10 MG capsule 1-2 at bedtime       . [DISCONTINUED] fexofenadine (ALLEGRA) 180 MG tablet Take 180 mg by mouth daily.         No current facility-administered medications on file prior to visit.   Review of Systems  Constitutional: Negative for unexpected weight change, or unusual diaphoresis  HENT: Negative for tinnitus.   Eyes: Negative for photophobia and visual disturbance.  Respiratory: Negative for choking and stridor.   Gastrointestinal: Negative for vomiting and blood in stool.  Genitourinary: Negative for hematuria and decreased urine volume.  Musculoskeletal: Negative for acute joint swelling Skin: Negative for color change and wound.  Neurological: Negative for tremors and numbness other than noted  Psychiatric/Behavioral: Negative for decreased concentration or  hyperactivity.       Objective:   Physical Exam BP 122/70  Pulse 109  Temp(Src) 97.8 F (36.6 C) (Oral)  Ht 5\' 5"  (1.651 m)  Wt 279 lb 6 oz (126.724 kg)  BMI 46.49 kg/m2  SpO2 95% VS noted,  Constitutional: Pt appears well-developed and well-nourished.  HENT: Head: NCAT.  Right Ear: External ear normal.  Left Ear: External ear normal.  Eyes: Conjunctivae and EOM are normal. Pupils are equal, round, and reactive to light.  Neck: Normal range of motion. Neck supple.  Cardiovascular: Normal rate and regular rhythm.   Pulmonary/Chest: Effort normal and breath sounds normal.  Abd:  Soft, NT, non-distended, + BS Neurological: Pt is alert. Not confused  Skin: Skin is warm. No erythema. severe psoriatic lesions to arms Left medial epicondylar area quite tender with mild swelling locally  Dorsalis pedis 1+ bilat Left > right edema 1+ to knees Knees with bilat crepitus, small left effusion, NT Psychiatric: Pt behavior is normal. Thought content normal.     Assessment & Plan:

## 2013-06-05 NOTE — Assessment & Plan Note (Signed)
**Note De-Identified Westman Obfuscation** For trial incr lasix 40 qd, klor con 10 qd

## 2013-06-05 NOTE — Assessment & Plan Note (Addendum)
**Note De-Identified Keagle Obfuscation** Etiology unclear, for LE art dopplers, declines NCS/EMG, also for trial gabapentin 300-600 qhs prn, f/u next visit  Note:  Total time for pt hx, exam, review of record with pt in the room, determination of diagnoses and plan for further eval and tx is > 40 min, with over 50% spent in coordination and counseling of patient

## 2013-06-05 NOTE — Assessment & Plan Note (Signed)
**Note De-Identified Mackel Obfuscation** She wants to try Chantix. Has hx depression and understands to quit Chantix if mood is a problem.

## 2013-06-05 NOTE — Patient Instructions (Addendum)
**Note De-Identified Walla Obfuscation** Very nice to meet you We will get an ultrasound of your arm to rule out clot.  Take the prednisone for next 5 days.  I want you to come back in 1-2 weeks. Give me a call Monday to say I am doing better.

## 2013-06-05 NOTE — Assessment & Plan Note (Signed)
**Note De-Identified Roughton Obfuscation** With known bilat knee djd, has been rec'd for surgury in the past, for ortho referral, may need pain clinic if unable to resolve

## 2013-06-05 NOTE — Assessment & Plan Note (Signed)
**Note De-Identified Acheampong Obfuscation** Differential is still quite broad. This is likely more secondary to myositis secondary to her autoimmune disease than anything else. Patient though ultrasound findings is concerning for potential opportunity thrombosis. This area is not my expertise and I will send her for a formal upper extremity ultrasound to rule out thrombosis. In the interim I will treat her for myositis with prednisone. Warned of potential side effects. This likely will also help patient's exacerbation of her psoriasis. Patient then will come back in one to 2 weeks if not doing better and we will consider does such things as compression and formal physical therapy if this is musculoskeletal in nature.

## 2013-06-05 NOTE — Assessment & Plan Note (Signed)
**Note De-Identified Frohlich Obfuscation** Near her baseline. Dependent on O2. We discussed portable concentrator rather than multiple small tanks.

## 2013-06-09 ENCOUNTER — Encounter (INDEPENDENT_AMBULATORY_CARE_PROVIDER_SITE_OTHER): Payer: Medicare Other

## 2013-06-09 DIAGNOSIS — M79604 Pain in right leg: Secondary | ICD-10-CM

## 2013-06-09 DIAGNOSIS — I739 Peripheral vascular disease, unspecified: Secondary | ICD-10-CM

## 2013-06-09 DIAGNOSIS — M79609 Pain in unspecified limb: Secondary | ICD-10-CM

## 2013-06-11 ENCOUNTER — Telehealth: Payer: Self-pay | Admitting: Internal Medicine

## 2013-06-11 DIAGNOSIS — J449 Chronic obstructive pulmonary disease, unspecified: Secondary | ICD-10-CM

## 2013-06-11 NOTE — Telephone Encounter (Signed)
**Note De-Identified Stinnette Obfuscation** Per OV note 06/04/13. Evaluate for for portable concentrator   2-3 L/M I have placed order. Please advise PCC's thanks

## 2013-06-11 NOTE — Telephone Encounter (Signed)
**Note De-identified Cairns Obfuscation** Error.  Duplicate message.  Holly D Pryor °- °

## 2013-06-11 NOTE — Telephone Encounter (Signed)
**Note De-Identified Hermance Obfuscation** Needs order for this Hailey Hernandez

## 2013-06-12 ENCOUNTER — Other Ambulatory Visit: Payer: Self-pay

## 2013-06-12 MED ORDER — LORAZEPAM 0.5 MG PO TABS: 0.5000 mg | ORAL_TABLET | Freq: Two times a day (BID) | ORAL | Status: DC | PRN

## 2013-06-12 NOTE — Telephone Encounter (Signed)
**Note De-Identified Lincks Obfuscation** Faxed hardcopy to [facility]

## 2013-06-12 NOTE — Telephone Encounter (Signed)
**Note De-identified Bornemann Obfuscation** Done hardcopy to robin  

## 2013-06-12 NOTE — Telephone Encounter (Signed)
**Note De-Identified Wisher Obfuscation** This [facility] was [facility] who called to say the could not accommodate this so pt had to be changed to another homecare [facility] should call her soon Tobe Sos

## 2013-06-16 ENCOUNTER — Encounter: Payer: Self-pay | Admitting: Family Medicine

## 2013-06-16 ENCOUNTER — Encounter: Payer: Self-pay | Admitting: Internal Medicine

## 2013-06-16 NOTE — Telephone Encounter (Signed)
**Note De-Identified Baetz Obfuscation** Debra or mary - do we know the status on the LE art dopplers?  Pt wants to know

## 2013-06-17 ENCOUNTER — Other Ambulatory Visit: Payer: Self-pay | Admitting: Internal Medicine

## 2013-06-17 DIAGNOSIS — I739 Peripheral vascular disease, unspecified: Secondary | ICD-10-CM

## 2013-06-23 ENCOUNTER — Telehealth: Payer: Self-pay | Admitting: Internal Medicine

## 2013-06-23 NOTE — Telephone Encounter (Signed)
**Note De-Identified Dax Obfuscation** ATC Kim, no answer LM for her to return office call

## 2013-06-24 NOTE — Telephone Encounter (Signed)
**Note De-identified Adrian Obfuscation** LMTCB

## 2013-06-25 NOTE — Telephone Encounter (Signed)
**Note De-Identified Kretsch Obfuscation** [id number] with Kathie Rhodes to have Kim call back. Carron Curie, CMA

## 2013-06-26 ENCOUNTER — Telehealth: Payer: Self-pay | Admitting: Internal Medicine

## 2013-06-26 DIAGNOSIS — J449 Chronic obstructive pulmonary disease, unspecified: Secondary | ICD-10-CM

## 2013-06-26 NOTE — Telephone Encounter (Signed)
**Note De-Identified Pixley Obfuscation** Dawn from [facility] stopped & states that what they have is Evaluation for portable O2 & What they need is an O2 order.  [phone].  Antionette Fairy

## 2013-06-26 NOTE — Telephone Encounter (Signed)
**Note De-Identified Dieudonne Obfuscation** lmtcb x1 for kim w/ co-worker

## 2013-06-26 NOTE — Telephone Encounter (Signed)
**Note De-Identified Tribbett Obfuscation** Order in EPIC dated 06/11/13 and it states pt O2 liter flow. Dawn is at lunch and will await call back

## 2013-06-26 NOTE — Telephone Encounter (Signed)
**Note De-Identified Quirarte Obfuscation** Spoke with dawn and order placed

## 2013-06-26 NOTE — Telephone Encounter (Signed)
**Note De-Identified Derosia Obfuscation** lmtcb x4 for Hailey Hernandez. Will sign off per triage protocol.

## 2013-06-26 NOTE — Telephone Encounter (Signed)
**Note De-Identified Andreason Obfuscation** Copied from previously closed message dated 06/23/13:  06/23/2013 4:19 PM Phone (Incoming) Kim, [facility] (Other) [phone]   Requests order for change in mobility from tanks to portable. Also, requests any recent O2 sats/testing.

## 2013-07-10 ENCOUNTER — Encounter: Payer: Self-pay | Admitting: Internal Medicine

## 2013-07-10 ENCOUNTER — Ambulatory Visit (INDEPENDENT_AMBULATORY_CARE_PROVIDER_SITE_OTHER): Payer: Medicare Other | Admitting: Internal Medicine

## 2013-07-10 VITALS — BP 120/70 | HR 107 | Temp 97.7°F | Ht 65.0 in | Wt 272.0 lb

## 2013-07-10 DIAGNOSIS — L97909 Non-pressure chronic ulcer of unspecified part of unspecified lower leg with unspecified severity: Secondary | ICD-10-CM

## 2013-07-10 DIAGNOSIS — I10 Essential (primary) hypertension: Secondary | ICD-10-CM

## 2013-07-10 DIAGNOSIS — F341 Dysthymic disorder: Secondary | ICD-10-CM

## 2013-07-10 DIAGNOSIS — M109 Gout, unspecified: Secondary | ICD-10-CM | POA: Insufficient documentation

## 2013-07-10 DIAGNOSIS — R609 Edema, unspecified: Secondary | ICD-10-CM

## 2013-07-10 DIAGNOSIS — L97911 Non-pressure chronic ulcer of unspecified part of right lower leg limited to breakdown of skin: Secondary | ICD-10-CM

## 2013-07-10 MED ORDER — ATORVASTATIN CALCIUM 20 MG PO TABS: 20.0000 mg | ORAL_TABLET | Freq: Every day | ORAL | Status: DC

## 2013-07-10 MED ORDER — METHYLPREDNISOLONE ACETATE 80 MG/ML IJ SUSP
80.0000 mg | Freq: Once | INTRAMUSCULAR | Status: AC
  Administered 2013-07-10: 80 mg via INTRAMUSCULAR

## 2013-07-10 MED ORDER — OMEPRAZOLE 20 MG PO CPDR: 20.0000 mg | DELAYED_RELEASE_CAPSULE | Freq: Every day | ORAL | Status: DC

## 2013-07-10 MED ORDER — COLCHICINE 0.6 MG PO TABS: 0.6000 mg | ORAL_TABLET | Freq: Every day | ORAL | Status: DC

## 2013-07-10 NOTE — Patient Instructions (Addendum)
**Note De-Identified Cecere Obfuscation** You had the steroid shot today Please take all new medication as prescribed - the colchrys every day (just one pill) Please continue all other medications as before Your Ativan is not yet due for refill since you were given a prescription that should be enough for 3 mo in mid September.   Please use the neosporin and bandaid to the wound on the right leg, as this can take several weeks to heal.  Please return if the wound is getting larger, more pain, red, swelling as this might be an infection that would need to be treated.  Please remember to sign up for My Chart if you have not done so, as this will be important to you in the future with finding out test results, communicating by private email, and scheduling acute appointments online when needed.  Please return in 3 months, or sooner if needed

## 2013-07-10 NOTE — Progress Notes (Signed)
**Note De-Identified Dubeau Obfuscation** Subjective:    Patient ID: Hailey Hernandez, female    DOB: 1956-02-21, 57 y.o.   MRN: [id number]  HPI  Here to f/u; overall doing ok,  Pt denies chest pain, increased sob or doe, wheezing, orthopnea, PND, increased LE swelling, palpitations, dizziness or syncope.  Pt denies polydipsia, polyuria,   Pt denies new neurological symptoms such as new headache, or facial or extremity weakness or numbness.   Pt states overall good compliance with meds, has been trying to follow lower cholesterol diet, with wt overall stable,  but little exercise however.  Since last visit both LE edema resolved, lost 6 lbs with higher lasix dosing but now with incidentally new gouty attack to left first MTP as she has had in the past. Tried the colchrys as prescribed high dose but cannot tolerate due to diarrhea.   Also Unofortunately struck right lower leg above the ankl with an IPAD edge 3 wks ago with a small shallow ulcer that is slowly better but still persistent   Denies worsening depressive symptoms, suicidal ideation, or panic; Past Medical History  Diagnosis Date  . Tachyarrhythmia   . Tobacco abuse   . Chronic bronchitis   . History of thyroid cancer   . Rhinitis   . Polymyalgia   . Diverticulitis   . DJD (degenerative joint disease)     spine  . Arthritis   . COPD (chronic obstructive pulmonary disease)   . Hyperlipidemia   . GERD (gastroesophageal reflux disease)   . Fibromyalgia   . Arthritis     bil legs  . Asthma   . Glaucoma   . Allergy   . Anxiety disorder   . Depression   . Cancer   . Complication of anesthesia     20 yrs ago turned blue after surgery  . HTN (hypertension)     no longer on BP medication  . Shortness of breath   . OSA (obstructive sleep apnea)     CPAP  last sleep study 8-10 yr.ag0  . Psoriasis   . Impaired glucose tolerance 05/08/2013   Past Surgical History  Procedure Laterality Date  . Total abdominal hysterectomy    . Cholecystectomy    . Appendectomy    . Carpal  tunnel release    . Left shoulder spurs    . Orif right lower leg    . Cholesteatoma excision      right ear  . Carpal tunnel release  10/30/2011    Procedure: CARPAL TUNNEL RELEASE;  Surgeon: Nicki Reaper, MD;  Location: [facility];  Service: Orthopedics;  Laterality: Right;  and mass excision  . Partial throidectomy      reports that she has been smoking Cigarettes.  She has a 67.5 pack-year smoking history. She has never used smokeless tobacco. She reports that she does not drink alcohol or use illicit drugs. family history includes Allergies in her sister; Alpha-1 antitrypsin deficiency in her sister and sister; Asthma in her sister; Cancer in her brother, mother, and sister; Emphysema in her sister and sister; Heart disease in her father and mother; Tuberculosis in her other. Allergies  Allergen Reactions  . Milnacipran     REACTION: dizzy  . Tramadol     Did not work   Current Outpatient Prescriptions on File Prior to Visit  Medication Sig Dispense Refill  . albuterol (PROVENTIL) (2.5 MG/3ML) 0.083% nebulizer solution Take 3 mLs (2.5 mg total) by nebulization every 4 (four) hours as needed for **Note De-Identified Peugh Obfuscation** wheezing or shortness of breath. DX: 496  180 vial  prn  . budesonide-formoterol (SYMBICORT) 160-4.5 MCG/ACT inhaler Inhale 2 puffs into the lungs daily.       . clotrimazole (LOTRIMIN) 1 % cream APPLY AS DIRECTED BY YOUR PHYSICIAN.     -FOR TOPICAL USE-  30 g  1  . folic acid (FOLVITE) 1 MG tablet Take 1 mg by mouth daily.       . furosemide (LASIX) 40 MG tablet Take 1 tablet (40 mg total) by mouth daily.  90 tablet  3  . gabapentin (NEURONTIN) 300 MG capsule 1-2 tabs by mouth at night as needed for pain  180 capsule  1  . HYDROcodone-acetaminophen (NORCO/VICODIN) 5-325 MG per tablet Take 1 tablet by mouth 2 (two) times daily as needed for pain.  60 tablet  2  . LORazepam (ATIVAN) 0.5 MG tablet Take 1 tablet (0.5 mg total) by mouth 2 (two) times daily as needed. For anxiety  60  tablet  2  . methotrexate (RHEUMATREX) 2.5 MG tablet 25 mg once a week. On wednesday Caution:Chemotherapy. Protect from light.      . Nebulizers (COMPRESSOR/NEBULIZER) MISC Use up to 4 times daily when needed  1 each  0  . potassium chloride (KLOR-CON 10) 10 MEQ tablet Take 1 tablet (10 mEq total) by mouth 2 (two) times daily.  90 tablet  3  . predniSONE (DELTASONE) 20 MG tablet Take 2 tablets (40 mg total) by mouth daily.  10 tablet  0  . [DISCONTINUED] doxepin (SINEQUAN) 10 MG capsule 1-2 at bedtime       . [DISCONTINUED] fexofenadine (ALLEGRA) 180 MG tablet Take 180 mg by mouth daily.         No current facility-administered medications on file prior to visit.   Review of Systems  Constitutional: Negative for unexpected weight change, or unusual diaphoresis  HENT: Negative for tinnitus.   Eyes: Negative for photophobia and visual disturbance.  Respiratory: Negative for choking and stridor.   Gastrointestinal: Negative for vomiting and blood in stool.  Genitourinary: Negative for hematuria and decreased urine volume.  Musculoskeletal: Negative for acute joint swelling Skin: Negative for color change and wound.  Neurological: Negative for tremors and numbness other than noted  Psychiatric/Behavioral: Negative for decreased concentration or  hyperactivity.       Objective:   Physical Exam BP 120/70  Pulse 107  Temp(Src) 97.7 F (36.5 C) (Oral)  Ht 5\' 5"  (1.651 m)  Wt 272 lb (123.378 kg)  BMI 45.26 kg/m2  SpO2 98% BP 120/70  Pulse 107  Temp(Src) 97.7 F (36.5 C) (Oral)  Ht 5\' 5"  (1.651 m)  Wt 272 lb (123.378 kg)  BMI 45.26 kg/m2  SpO2 98% VS noted,  Constitutional: Pt appears well-developed and well-nourished.  HENT: Head: NCAT.  Right Ear: External ear normal.  Left Ear: External ear normal.  Eyes: Conjunctivae and EOM are normal. Pupils are equal, round, and reactive to light.  Neck: Normal range of motion. Neck supple.  Cardiovascular: Normal rate and regular  rhythm.   Pulmonary/Chest: Effort normal and breath sounds normal.  Abd:  Soft, NT, non-distended, + BS Neurological: Pt is alert. Not confused  Skin: Skin is warm. Right mid ant leg with < 1/2 cm very shallow ulceration without signficant erythema, swelling, drainage ; no LE edema  Left first MTP with 1-2+ red/tender/swelling Psychiatric: Pt behavior is normal. Thought content normal. 1+ nervous    Assessment & Plan:

## 2013-07-13 DIAGNOSIS — L97909 Non-pressure chronic ulcer of unspecified part of unspecified lower leg with unspecified severity: Secondary | ICD-10-CM | POA: Insufficient documentation

## 2013-07-13 NOTE — Assessment & Plan Note (Signed)
**Note De-identified Poffenberger Obfuscation** Mild to mod, for predpack asd,  to f/u any worsening symptoms or concerns 

## 2013-07-13 NOTE — Assessment & Plan Note (Signed)
**Note De-Identified Zieger Obfuscation** Resolved with current lasix with approp wt loss, cont as is,  to f/u any worsening symptoms or concerns

## 2013-07-13 NOTE — Assessment & Plan Note (Signed)
**Note De-Identified Lobue Obfuscation** To cont ativan prn

## 2013-07-13 NOTE — Assessment & Plan Note (Signed)
**Note De-Identified Krall Obfuscation** stable overall by history and exam, recent data reviewed with pt, and pt to continue medical treatment as before,  to f/u any worsening symptoms or concerns BP Readings from Last 3 Encounters:  07/10/13 120/70  06/05/13 122/70  06/05/13 122/70

## 2013-07-13 NOTE — Assessment & Plan Note (Signed)
**Note De-Identified Downie Obfuscation** Small , very mild, posttraumatic, slow healing but no s/s infection at this time, for triple antibx,  to f/u any worsening symptoms or concerns

## 2013-07-15 ENCOUNTER — Encounter: Payer: Self-pay | Admitting: Vascular Surgery

## 2013-07-16 ENCOUNTER — Ambulatory Visit (INDEPENDENT_AMBULATORY_CARE_PROVIDER_SITE_OTHER): Payer: Medicare Other | Admitting: Vascular Surgery

## 2013-07-16 ENCOUNTER — Encounter: Payer: Self-pay | Admitting: Vascular Surgery

## 2013-07-16 VITALS — BP 149/77 | HR 99 | Ht 65.0 in | Wt 269.9 lb

## 2013-07-16 DIAGNOSIS — I739 Peripheral vascular disease, unspecified: Secondary | ICD-10-CM

## 2013-07-16 DIAGNOSIS — I872 Venous insufficiency (chronic) (peripheral): Secondary | ICD-10-CM | POA: Insufficient documentation

## 2013-07-16 NOTE — Progress Notes (Signed)
**Note De-Identified Gilberg Obfuscation** [facility] [place] HISTORY AND PHYSICAL   History of Present Illness:  Patient is a 57 y.o. year old female who presents for evaluation of lower extremity pain and possible peripheral arterial disease. She states that both legs and her feet hurt any symptoms have been present for several years. She currently smokes 1 pack of cigarettes per day. Greater than 3 minutes they were spent regarding smoking cessation counseling. She also become short of breath near the same time that her legs hurt. She has had skin ulcerations from leg edema in the past. Her last episode was 4 years ago. She did have a trauma to her right lower extremity which caused a small ulcer several weeks ago and this has not completely healed. She denies prior history of DVT. She has not worn compression stockings in the past. She does not really describe claudication symptoms but primarily fatigue.  Other medical problems include degenerative joint disease, COPD, hyperlipidemia, sleep apnea, atrial fibrillation, impaired glucose tolerance. All these are currently stable. She is essentially on continuous home oxygen. She states that she does take her oxygen off to smoke.  Past Medical History  Diagnosis Date  . Tachyarrhythmia   . Tobacco abuse   . Chronic bronchitis   . History of thyroid cancer   . Rhinitis   . Polymyalgia   . Diverticulitis   . DJD (degenerative joint disease)     spine  . Arthritis   . COPD (chronic obstructive pulmonary disease)   . Hyperlipidemia   . GERD (gastroesophageal reflux disease)   . Fibromyalgia   . Arthritis     bil legs  . Asthma   . Glaucoma   . Allergy   . Anxiety disorder   . Depression   . Cancer   . Complication of anesthesia     20 yrs ago turned blue after surgery  . HTN (hypertension)     no longer on BP medication  . Shortness of breath   . OSA (obstructive sleep apnea)     CPAP  last sleep study 8-10 yr.ag0  . Psoriasis   . Impaired glucose  tolerance 05/08/2013  . Atrial fibrillation     Past Surgical History  Procedure Laterality Date  . Total abdominal hysterectomy    . Cholecystectomy    . Appendectomy    . Carpal tunnel release    . Left shoulder spurs    . Orif right lower leg    . Cholesteatoma excision      right ear  . Carpal tunnel release  10/30/2011    Procedure: CARPAL TUNNEL RELEASE;  Surgeon: Nicki Reaper, MD;  Location: [facility];  Service: Orthopedics;  Laterality: Right;  and mass excision  . Partial throidectomy      Social History History  Substance Use Topics  . Smoking status: Current Every Day Smoker -- 1.00 packs/day for 45 years    Types: Cigarettes  . Smokeless tobacco: Never Used     Comment: pt states that she has chantix but is waiting until after seeing Dr. Darrick Penna before taking them  . Alcohol Use: No    Family History Family History  Problem Relation Age of Onset  . Emphysema Sister   . Hypertension Sister   . Heart attack Sister   . Emphysema Sister   . Alpha-1 antitrypsin deficiency Sister   . Alpha-1 antitrypsin deficiency Sister   . Allergies Sister   . Asthma Sister   . Heart **Note De-Identified Roser Obfuscation** disease Mother   . Cancer Mother   . Hyperlipidemia Mother   . Hypertension Mother   . Heart attack Mother   . Heart disease Father   . Heart attack Father   . Cancer Sister   . Cancer Brother   . Hyperlipidemia Brother   . Hypertension Brother   . Tuberculosis Other     grandmother  . Hyperlipidemia Daughter   . Hypertension Daughter   . Hypertension Son     Allergies  Allergies  Allergen Reactions  . Milnacipran     REACTION: dizzy  . Tramadol     Did not work     Current Outpatient Prescriptions  Medication Sig Dispense Refill  . albuterol (PROVENTIL) (2.5 MG/3ML) 0.083% nebulizer solution Take 3 mLs (2.5 mg total) by nebulization every 4 (four) hours as needed for wheezing or shortness of breath. DX: 496  180 vial  prn  . atorvastatin (LIPITOR) 20 MG tablet  Take 1 tablet (20 mg total) by mouth daily.  90 tablet  3  . budesonide-formoterol (SYMBICORT) 160-4.5 MCG/ACT inhaler Inhale 2 puffs into the lungs daily.       . clotrimazole (LOTRIMIN) 1 % cream APPLY AS DIRECTED BY YOUR PHYSICIAN.     -FOR TOPICAL USE-  30 g  1  . colchicine 0.6 MG tablet Take 1 tablet (0.6 mg total) by mouth daily.  90 tablet  3  . folic acid (FOLVITE) 1 MG tablet Take 1 mg by mouth daily.       . furosemide (LASIX) 40 MG tablet Take 1 tablet (40 mg total) by mouth daily.  90 tablet  3  . gabapentin (NEURONTIN) 300 MG capsule 1-2 tabs by mouth at night as needed for pain  180 capsule  1  . HYDROcodone-acetaminophen (NORCO/VICODIN) 5-325 MG per tablet Take 1 tablet by mouth 2 (two) times daily as needed for pain.  60 tablet  2  . LORazepam (ATIVAN) 0.5 MG tablet Take 1 tablet (0.5 mg total) by mouth 2 (two) times daily as needed. For anxiety  60 tablet  2  . methotrexate (RHEUMATREX) 2.5 MG tablet 25 mg once a week. On wednesday Caution:Chemotherapy. Protect from light.      . Nebulizers (COMPRESSOR/NEBULIZER) MISC Use up to 4 times daily when needed  1 each  0  . omeprazole (PRILOSEC) 20 MG capsule Take 1 capsule (20 mg total) by mouth daily.  90 capsule  3  . potassium chloride (KLOR-CON 10) 10 MEQ tablet Take 1 tablet (10 mEq total) by mouth 2 (two) times daily.  90 tablet  3  . predniSONE (DELTASONE) 20 MG tablet Take 2 tablets (40 mg total) by mouth daily.  10 tablet  0  . [DISCONTINUED] doxepin (SINEQUAN) 10 MG capsule 1-2 at bedtime       . [DISCONTINUED] fexofenadine (ALLEGRA) 180 MG tablet Take 180 mg by mouth daily.         No current facility-administered medications for this visit.    ROS:   General:  No weight loss, Fever, chills  HEENT: No recent headaches, no nasal bleeding, no visual changes, no sore throat  Neurologic: No dizziness, blackouts, seizures. No recent symptoms of stroke or mini- stroke. No recent episodes of slurred speech, or temporary  blindness.  Cardiac: No recent episodes of chest pain/pressure, no shortness of breath at rest.  + shortness of breath with exertion.  Denies history of atrial fibrillation or irregular heartbeat  Vascular: No history of rest pain in **Note De-Identified Sollars Obfuscation** feet.  No history of claudication.  + history of non-healing ulcer, No history of DVT   Pulmonary: + home oxygen, no productive cough, no hemoptysis,  No asthma or wheezing  Musculoskeletal:  [x ] Arthritis, [ ]  Low back pain,  [ ]  Joint pain  Hematologic:No history of hypercoagulable state.  No history of easy bleeding.  No history of anemia  Gastrointestinal: No hematochezia or melena,  No gastroesophageal reflux, no trouble swallowing  Urinary: [ ]  chronic Kidney disease, [ ]  on HD - [ ]  MWF or [ ]  TTHS, [ ]  Burning with urination, [ ]  Frequent urination, [ ]  Difficulty urinating;   Skin: No rashes  Psychological: No history of anxiety,  + history of depression   Physical Examination  Filed Vitals:   07/16/13 1007  BP: 149/77  Pulse: 99  Height: 5\' 5"  (1.651 m)  Weight: 269 lb 14.4 oz (122.426 kg)  SpO2: 97%    Body mass index is 44.91 kg/(m^2).  General:  Alert and oriented, no acute distress HEENT: Normal Neck: No bruit or JVD Pulmonary: Clear to auscultation bilaterally Cardiac: Regular Rate and Rhythm without murmur Abdomen: Soft, non-tender, non-distended, no mass, severe obesity Skin: No rash, 2 cm ulceration right pretibial area brawny staining the tibial area bilaterally Extremity Pulses:  2+ radial, brachial, femoral, dorsalis pedis pulses bilaterally Musculoskeletal: No deformity or edema  Neurologic: Upper and lower extremity motor 5/5 and symmetric  DATA:  Duplex ultrasound report from [facility] dated September 19 is reviewed. ABIs were 0.91 on the right 0.97 on the left there was concern for aortoiliac occlusive disease due to the fact that femoral waveforms were not completely normal  ASSESSMENT:  Patient most  likely has some underlying arterial occlusive disease. However I would characterize this mild in symptoms. She has palpable pulses on exam and ABIs that are near normal. She is a terrible candidate for any intervention due to her multiple comorbidities including severe obesity and home oxygen dependence. She is noncompliant as far as smoking cessation. She is certainly not at risk of limb loss at this point. A large component of her leg symptoms probably also is secondary to venous hypertension from her obesity.  PLAN:  Patient was given a prescription today for bilateral lower extremity compression stockings. She will followup on as-needed basis. If she developed limb threatening ischemia or more significant tissue loss the did not heal with compression therapy alone consideration could be given for further evaluation of her aortoiliac system however clinically I do not believe this is indicated currently.  Pathophysiology of arterial and venous disease was discussed the patient and her 2 daughters today. Smoking cessation was emphasized.  Fabienne Bruns, MD [facility] [place] Office: [phone] Pager: [phone]

## 2013-07-31 ENCOUNTER — Other Ambulatory Visit: Payer: Self-pay | Admitting: Internal Medicine

## 2013-08-11 ENCOUNTER — Encounter: Payer: Self-pay | Admitting: Internal Medicine

## 2013-08-11 MED ORDER — HYDROCODONE-ACETAMINOPHEN 5-325 MG PO TABS: 1.0000 | ORAL_TABLET | Freq: Two times a day (BID) | ORAL | Status: DC | PRN

## 2013-08-11 NOTE — Telephone Encounter (Signed)
**Note De-Identified Lezcano Obfuscation** Done hardcopy to robin, but also to let pt know:  You are given the letter today explaining the transitional pain medication refill policy due to recent change in [place] law  Please be aware that I will no longer be able to offer monthly refills of any Schedule II or higher medication starting Nov 01, 2013

## 2013-09-09 ENCOUNTER — Other Ambulatory Visit: Payer: Self-pay | Admitting: Internal Medicine

## 2013-09-09 NOTE — Telephone Encounter (Signed)
**Note De-identified Pohlman Obfuscation** Pt informed

## 2013-09-09 NOTE — Telephone Encounter (Signed)
**Note De-Identified Manganiello Obfuscation** Robin to let pt know - rx for vicodin already done recently

## 2013-09-18 ENCOUNTER — Encounter: Payer: Self-pay | Admitting: Internal Medicine

## 2013-09-18 DIAGNOSIS — R319 Hematuria, unspecified: Secondary | ICD-10-CM

## 2013-09-18 MED ORDER — CEPHALEXIN 500 MG PO CAPS: 500.0000 mg | ORAL_CAPSULE | Freq: Four times a day (QID) | ORAL | Status: DC

## 2013-09-18 NOTE — Telephone Encounter (Signed)
**Note De-Identified Yusupov Obfuscation** Please come to lab sometime today for urine testing if possible  OK for antibx - done per emr

## 2013-09-29 ENCOUNTER — Other Ambulatory Visit (INDEPENDENT_AMBULATORY_CARE_PROVIDER_SITE_OTHER): Payer: Medicare Other

## 2013-09-29 DIAGNOSIS — R319 Hematuria, unspecified: Secondary | ICD-10-CM

## 2013-09-29 LAB — URINALYSIS, ROUTINE W REFLEX MICROSCOPIC
Hgb urine dipstick: NEGATIVE
Nitrite: NEGATIVE
Total Protein, Urine: NEGATIVE
Urine Glucose: NEGATIVE
WBC, UA: NONE SEEN (ref 0–?)
pH: 5.5 (ref 5.0–8.0)

## 2013-10-09 ENCOUNTER — Encounter: Payer: Self-pay | Admitting: Internal Medicine

## 2013-10-09 ENCOUNTER — Ambulatory Visit (INDEPENDENT_AMBULATORY_CARE_PROVIDER_SITE_OTHER): Payer: Medicare Other | Admitting: Internal Medicine

## 2013-10-09 VITALS — BP 140/82 | HR 100 | Ht 64.5 in | Wt 266.4 lb

## 2013-10-09 DIAGNOSIS — J42 Unspecified chronic bronchitis: Secondary | ICD-10-CM

## 2013-10-09 DIAGNOSIS — G4733 Obstructive sleep apnea (adult) (pediatric): Secondary | ICD-10-CM

## 2013-10-09 DIAGNOSIS — J449 Chronic obstructive pulmonary disease, unspecified: Secondary | ICD-10-CM

## 2013-10-09 DIAGNOSIS — F17200 Nicotine dependence, unspecified, uncomplicated: Secondary | ICD-10-CM

## 2013-10-09 MED ORDER — PHENYLEPHRINE HCL 1 % NA SOLN
3.0000 [drp] | Freq: Once | NASAL | Status: AC
  Administered 2013-10-09: 3 [drp] via NASAL

## 2013-10-09 MED ORDER — VARENICLINE TARTRATE 0.5 MG X 11 & 1 MG X 42 PO MISC: ORAL | Status: DC

## 2013-10-09 MED ORDER — METHYLPREDNISOLONE ACETATE 80 MG/ML IJ SUSP
80.0000 mg | Freq: Once | INTRAMUSCULAR | Status: AC
  Administered 2013-10-09: 80 mg via INTRAMUSCULAR

## 2013-10-09 MED ORDER — AMOXICILLIN-POT CLAVULANATE 500-125 MG PO TABS: ORAL_TABLET | ORAL | Status: DC

## 2013-10-09 NOTE — Progress Notes (Signed)
**Note De-Identified Goller Obfuscation** Patient ID: Hailey Hernandez, female    DOB: April 05, 1956, 58 y.o.   MRN: [id number]  HPI 35 female with known hx of chronic bronchitis , OSA/OHS , active smoker   02/05/11- 55 yoFwith chronic bronchitis, tobacco use, OSA, obesity hypoventilation syndrome. Last here December 15, 2010. Since then has continued Symbicort. Comes with sister today. Sleeping 2 nights ago- woke smothered. Had to pull off CPAP. She gradually cleared over several minutes. Not clear if she refluxed, had mucus plug or ??. Today is typical with no residual. Chest congestion varies day to day. Says cough better, not coughing up anything purulent or bloody. Still averaging 1.5 PPD, down from 3 PPD.  Compliant with CPAP- needs new mask.   07/12/2011 Acute OV  Presents for an acute office visit. Complains pain in right side x 3 days--more painful with cough and movement.  cough with green sputum--. She has been sick for 2 weeks, cough, congestion , drainage. Was exposed to URI symptoms at hospital . Started with lots of drippy nose and drainge.  No fever. Initially phoned in a zpack with no improvement. Then started on Omnicef 4 days ago.  Does have some wheezing . Now pain with inspiration esp along right lateral ribs. Tender to touch and with movement. No rash or fever.   08/10/11- 54 yoFwith chronic bronchitis, tobacco use, OSA, obesity hypoventilation syndrome. Several family members here.Marland Kitchen PCP Dr Edrick Oh She describes persistent ear ache on the right, nasal discharge from the left with little bit of bloody mucus. Distal coughing productively with light yellow sputum, no blood. Questions low-grade fever mainly because she sometimes feels chilly. Smoking is down from 3 packs a day to a little under one pack per day. I encouraged her to keep working at. She gives history of a cholesteatoma and told never to put water in her right ear.  12/10/11- 32 yoFwith chronic bronchitis, tobacco use, OSA, obesity hypoventilation syndrome.  Sister  here. Her sister Hailey Hernandez died in her sleep of end-stage COPD complicated by alpha-1 antitrypsin deficiency. We had called in Augmentin which did help some. Complains now of sinus pressure, blowing bloody scabs from her nose, postnasal drip, cough productive of thick mucus. Denies fever, swollen glands, headache. She has decided that she finally wants to quit smoking. Has reduced from 3 packs per day habit to one pack per day. We discussed support, medical reasons and available techniques and answered her questions.  05/15/12-55 yoFwith chronic bronchitis, tobacco use, OSA, obesity hypoventilation syndrome.  Sister here.  Pt states breathing has been "okay" since last seen. Pt c/o increased SOB and allergy trouble with HA . Pt denies prod cough, wheezing and chest tightness.. unhappy with [name] and wants to change to [name]. CPAP 13/ O2 2L/ [facility] Parking requested  COPD assessment test (CAT) score 14/40 CXR 07/12/11- IMPRESSION:  Bronchitic changes. Left base atelectasis.  Original Report Authenticated By: Raelyn Number, M.D.   09/15/12- 56 yoFwith chronic bronchitis, tobacco use, OSA, obesity hypoventilation syndrome.  Sister here. Follows for: pt states last couple of weeks increase sob,productive cough.chest tightness.denies any wheezing.  For 2 weeks her eyes had been itching and she has had frontal headache, watery rhinorrhea and a little bit of epistaxis. Scratchy throat. Questions low-grade fever. Treating with antihistamines. She continues oxygen 2-3 L per minute/[facility]. Continues good compliance with CPAP 13+ oxygen 2 L at night. She is making no effort to stop smoking despite our counseling. CXR 05/15/12-reviewed IMPRESSION:  Stable **Note De-Identified Recker Obfuscation** bronchitic changes and interstitial prominence.  Lingular scarring or atelectasis.  Borderline cardiomegaly.  Original Report Authenticated By: Raelyn Number, M.D.   01/30/13-  56 yoFwith chronic bronchitis, tobacco use, OSA,  obesity hypoventilation syndrome.  Sister here. FOLLOWS FOR: states she is having increased SOB Home oxygen [facility] using 3 L at home, 2 L portable. CPAP 13+ oxygen 2 L for sleep Expresses interest in quitting smoking and asks for Chantix which we discussed. Productive cough with some chest congestion, white or yellow sputum. No blood, no chest pain. New problem-3 weeks, aching right upper tibia anteriorly keeps her awake at hurts to bear weight. CXR 11/07/12 IMPRESSION:  Stable chest x-ray with cardiomegaly. No active lung disease.  Original Report Authenticated By: Ivar Drape, M.D.  06/05/13- 57 yoFwith chronic bronchitis, tobacco use, OSA, obesity hypoventilation syndrome.  Sister here. FOLLOWS FOR: Breathing unchanged since last OV.  Needs to discuss oxygen today Home oxygen [facility] using 3 L at home, 2 L portable. CPAP 13+ oxygen 2 L for sleep Anxious about trying Chantix, but decided to try it- discussed. Failed Wellbutrin.  10/09/13- 11 yoFwith chronic bronchitis, tobacco use (1 ppd/ 45 pk yrs), OSA, obesity hypoventilation syndrome.  Sister here. FOLLOWS FOR:  Still having sob, cough (non-productive) and wheezing.  Also, headaches and facial swelling, painfull  to the touch. CPAP 13+ oxygen 2 L for sleep/ [facility] She looked into a portable concentrator but home care can't provide it with her insurance. Upper respiratory infection/sinusitis over Christmas, treated with Keflex which helped but she still has nasal congestion and blows small amounts of blood from her nose without headache or fever.  Review of Systems-see HPI Constitutional:   No-   weight loss, night sweats, fevers, chills, fatigue, lassitude. HEENT:   + headaches, difficulty swallowing, tooth/dental problems, sore throat,       No-  sneezing, itching,  ear ache,  +nasal congestion, +post nasal drip,  CV:  No-   chest pain, orthopnea, PND, swelling in lower extremities, anasarca,   dizziness,  palpitations Resp: +  shortness of breath with exertion or at rest.              No- productive cough,  + non-productive cough,  No- coughing up of blood.              No-   change in color of mucus.  No- wheezing.   Skin: psoriasis GI:  No-   heartburn, indigestion, abdominal pain, nausea, vomiting,  GU: No-    MS:  + joint pain or swelling. . Neuro-     nothing unusual Psych:  No- change in mood or affect. No depression or anxiety.  No memory loss.   Objective:   Physical Exam General- Alert, Oriented, Affect-appropriate, Distress- none acute, obese. On 3L O2 sat 93% Skin- +extensive psoriasis,  Lymphadenopathy- none Head- atraumatic            Eyes- Gross vision intact, PERRLA, conjunctivae clear secretions            Ears- Hearing, canals- cerumen/ debris R            Nose- c+ turbinate edema, no-Septal dev, mucus, polyps, erosion, perforation             Throat- Mallampati III , mucosa clear , drainage- none, tonsils- atrophic. Mouth breathing,                      +dentures Neck- flexible , **Note De-Identified Vanderberg Obfuscation** trachea midline, no stridor , thyroid nl, carotid no bruit Chest - symmetrical excursion , unlabored           Heart/CV- RRR , no murmur , no gallop  , no rub, nl s1 s2                           - JVD- none , edema- none, stasis changes+ bilateral, varices- none           Lung- No- cough, clear. , dullness-none, rub- none           Chest wall-  Abd-  Br/ Gen/ Rectal- Not done, not indicated Extrem- +apparent lipoma right medial knee with stasis changes but no edema. Neuro- grossly intact to observation

## 2013-10-09 NOTE — Patient Instructions (Addendum)
**Note De-Identified Popp Obfuscation** Neb neo nasal  Depo 80  Script printedt for Chantix  Script sent for augmentin antibiotic

## 2013-10-16 ENCOUNTER — Ambulatory Visit (INDEPENDENT_AMBULATORY_CARE_PROVIDER_SITE_OTHER): Payer: Medicare Other | Admitting: Internal Medicine

## 2013-10-16 ENCOUNTER — Other Ambulatory Visit: Payer: Self-pay | Admitting: Internal Medicine

## 2013-10-16 ENCOUNTER — Encounter: Payer: Self-pay | Admitting: Internal Medicine

## 2013-10-16 ENCOUNTER — Other Ambulatory Visit (INDEPENDENT_AMBULATORY_CARE_PROVIDER_SITE_OTHER): Payer: Medicare Other

## 2013-10-16 VITALS — BP 132/84 | HR 90 | Temp 97.4°F | Ht 64.0 in | Wt 263.8 lb

## 2013-10-16 DIAGNOSIS — Z0000 Encounter for general adult medical examination without abnormal findings: Secondary | ICD-10-CM

## 2013-10-16 DIAGNOSIS — R7302 Impaired glucose tolerance (oral): Secondary | ICD-10-CM

## 2013-10-16 DIAGNOSIS — I10 Essential (primary) hypertension: Secondary | ICD-10-CM

## 2013-10-16 DIAGNOSIS — Z23 Encounter for immunization: Secondary | ICD-10-CM

## 2013-10-16 DIAGNOSIS — R7309 Other abnormal glucose: Secondary | ICD-10-CM

## 2013-10-16 LAB — HEPATIC FUNCTION PANEL
ALT: 33 U/L (ref 0–35)
AST: 52 U/L — AB (ref 0–37)
Albumin: 3.8 g/dL (ref 3.5–5.2)
Alkaline Phosphatase: 149 U/L — ABNORMAL HIGH (ref 39–117)
BILIRUBIN DIRECT: 0.1 mg/dL (ref 0.0–0.3)
BILIRUBIN TOTAL: 0.7 mg/dL (ref 0.3–1.2)
Total Protein: 8.7 g/dL — ABNORMAL HIGH (ref 6.0–8.3)

## 2013-10-16 LAB — BASIC METABOLIC PANEL
BUN: 13 mg/dL (ref 6–23)
CHLORIDE: 95 meq/L — AB (ref 96–112)
CO2: 35 mEq/L — ABNORMAL HIGH (ref 19–32)
Calcium: 9.1 mg/dL (ref 8.4–10.5)
Creatinine, Ser: 0.7 mg/dL (ref 0.4–1.2)
GFR: 90.81 mL/min (ref >60.00)
Glucose, Bld: 117 mg/dL — ABNORMAL HIGH (ref 70–99)
POTASSIUM: 4.3 meq/L (ref 3.5–5.1)
Sodium: 138 mEq/L (ref 135–145)

## 2013-10-16 LAB — CBC WITH DIFFERENTIAL/PLATELET
BASOS PCT: 0.7 % (ref 0.0–3.0)
Basophils Absolute: 0.1 10*3/uL (ref 0.0–0.1)
EOS ABS: 0.2 10*3/uL (ref 0.0–0.7)
Eosinophils Relative: 1.6 % (ref 0.0–5.0)
HCT: 48.8 % — ABNORMAL HIGH (ref 36.0–46.0)
Hemoglobin: 16.2 g/dL — ABNORMAL HIGH (ref 12.0–15.0)
Lymphocytes Relative: 16.8 % (ref 12.0–46.0)
Lymphs Abs: 2 10*3/uL (ref 0.7–4.0)
MCHC: 33.2 g/dL (ref 30.0–36.0)
MCV: 98.6 fl (ref 78.0–100.0)
Monocytes Absolute: 0.7 10*3/uL (ref 0.1–1.0)
Monocytes Relative: 6 % (ref 3.0–12.0)
NEUTROS PCT: 74.9 % (ref 43.0–77.0)
Neutro Abs: 8.8 10*3/uL — ABNORMAL HIGH (ref 1.4–7.7)
PLATELETS: 269 10*3/uL (ref 150.0–400.0)
RBC: 4.95 Mil/uL (ref 3.87–5.11)
RDW: 16.5 % — ABNORMAL HIGH (ref 11.5–14.6)
WBC: 11.8 10*3/uL — ABNORMAL HIGH (ref 4.5–10.5)

## 2013-10-16 LAB — URINALYSIS, ROUTINE W REFLEX MICROSCOPIC
BILIRUBIN URINE: NEGATIVE
Hgb urine dipstick: NEGATIVE
KETONES UR: NEGATIVE
Leukocytes, UA: NEGATIVE
NITRITE: NEGATIVE
RBC / HPF: NONE SEEN (ref 0–?)
Specific Gravity, Urine: 1.01 (ref 1.000–1.030)
TOTAL PROTEIN, URINE-UPE24: 30 — AB
Urine Glucose: NEGATIVE
Urobilinogen, UA: 0.2 (ref 0.0–1.0)
pH: 6 (ref 5.0–8.0)

## 2013-10-16 LAB — HEMOGLOBIN A1C: HEMOGLOBIN A1C: 6.6 % — AB (ref 4.6–6.5)

## 2013-10-16 LAB — LIPID PANEL
CHOL/HDL RATIO: 5
CHOLESTEROL: 216 mg/dL — AB (ref 0–200)
HDL: 40.4 mg/dL (ref >39.00)
Triglycerides: 141 mg/dL (ref 0.0–149.0)
VLDL: 28.2 mg/dL (ref 0.0–40.0)

## 2013-10-16 LAB — TSH: TSH: 1.51 u[IU]/mL (ref 0.35–5.50)

## 2013-10-16 LAB — LDL CHOLESTEROL, DIRECT: Direct LDL: 159.9 mg/dL

## 2013-10-16 MED ORDER — ATORVASTATIN CALCIUM 40 MG PO TABS: 40.0000 mg | ORAL_TABLET | Freq: Every day | ORAL | Status: DC

## 2013-10-16 MED ORDER — LORAZEPAM 0.5 MG PO TABS: 0.5000 mg | ORAL_TABLET | Freq: Two times a day (BID) | ORAL | Status: DC | PRN

## 2013-10-16 MED ORDER — OMEPRAZOLE 20 MG PO CPDR: 20.0000 mg | DELAYED_RELEASE_CAPSULE | Freq: Every day | ORAL | Status: DC

## 2013-10-16 NOTE — Assessment & Plan Note (Signed)
**Note De-Identified Nordahl Obfuscation** Lab Results  Component Value Date   HGBA1C 6.6* 10/16/2013   stable overall by history and exam, recent data reviewed with pt, and pt to continue medical treatment as before,  to f/u any worsening symptoms or concerns

## 2013-10-16 NOTE — Progress Notes (Signed)
**Note De-Identified Tonelli Obfuscation** Subjective:    Patient ID: Hailey Hernandez, female    DOB: 1956-06-01, 58 y.o.   MRN: [id number]  HPI  Here for wellness and f/u;  Overall doing ok;  Pt denies CP, worsening SOB, DOE, wheezing, orthopnea, PND, worsening LE edema, palpitations, dizziness or syncope.  Pt denies neurological change such as new headache, facial or extremity weakness.  Pt denies polydipsia, polyuria, or low sugar symptoms. Pt states overall good compliance with treatment and medications, good tolerability, and has been trying to follow lower cholesterol diet.  Pt denies worsening depressive symptoms, suicidal ideation or panic. No fever, night sweats, wt loss, loss of appetite, or other constitutional symptoms.  Pt states good ability with ADL's, has low fall risk, home safety reviewed and adequate, no other significant changes in hearing or vision, and only occasionally active with exercise.  Had recent UTI/hematuria now resolved.  No acute complaints Past Medical History  Diagnosis Date  . Tachyarrhythmia   . Tobacco abuse   . Chronic bronchitis   . History of thyroid cancer   . Rhinitis   . Polymyalgia   . Diverticulitis   . DJD (degenerative joint disease)     spine  . Arthritis   . COPD (chronic obstructive pulmonary disease)   . Hyperlipidemia   . GERD (gastroesophageal reflux disease)   . Fibromyalgia   . Arthritis     bil legs  . Asthma   . Glaucoma   . Allergy   . Anxiety disorder   . Depression   . Cancer   . Complication of anesthesia     20 yrs ago turned blue after surgery  . HTN (hypertension)     no longer on BP medication  . Shortness of breath   . OSA (obstructive sleep apnea)     CPAP  last sleep study 8-10 yr.ag0  . Psoriasis   . Impaired glucose tolerance 05/08/2013  . Atrial fibrillation    Past Surgical History  Procedure Laterality Date  . Total abdominal hysterectomy    . Cholecystectomy    . Appendectomy    . Carpal tunnel release    . Left shoulder spurs    . Orif  right lower leg    . Cholesteatoma excision      right ear  . Carpal tunnel release  10/30/2011    Procedure: CARPAL TUNNEL RELEASE;  Surgeon: Wynonia Sours, MD;  Location: [facility];  Service: Orthopedics;  Laterality: Right;  and mass excision  . Partial throidectomy      reports that she has been smoking Cigarettes.  She has a 45 pack-year smoking history. She has never used smokeless tobacco. She reports that she does not drink alcohol or use illicit drugs. family history includes Allergies in her sister; Alpha-1 antitrypsin deficiency in her sister and sister; Asthma in her sister; Cancer in her brother, mother, and sister; Emphysema in her sister and sister; Heart attack in her father, mother, and sister; Heart disease in her father and mother; Hyperlipidemia in her brother, daughter, and mother; Hypertension in her brother, daughter, mother, sister, and son; Tuberculosis in her other. Allergies  Allergen Reactions  . Milnacipran     REACTION: dizzy  . Tramadol     Did not work   Current Outpatient Prescriptions on File Prior to Visit  Medication Sig Dispense Refill  . albuterol (PROVENTIL) (2.5 MG/3ML) 0.083% nebulizer solution Take 3 mLs (2.5 mg total) by nebulization every 4 (four) hours as needed **Note De-Identified Polakowski Obfuscation** for wheezing or shortness of breath. DX: 496  180 vial  prn  . budesonide-formoterol (SYMBICORT) 160-4.5 MCG/ACT inhaler Inhale 2 puffs into the lungs daily.       . clotrimazole (LOTRIMIN) 1 % cream APPLY AS DIRECTED BY YOUR PHYSICIAN.     -FOR TOPICAL USE-  30 g  1  . colchicine 0.6 MG tablet Take 1 tablet (0.6 mg total) by mouth daily.  90 tablet  3  . folic acid (FOLVITE) 1 MG tablet Take 1 mg by mouth daily.       Marland Kitchen gabapentin (NEURONTIN) 300 MG capsule 1-2 tabs by mouth at night as needed for pain  180 capsule  1  . HYDROcodone-acetaminophen (NORCO/VICODIN) 5-325 MG per tablet Take 1 tablet by mouth 2 (two) times daily as needed. To fill Oct 11, 2013  60 tablet  0  .  methotrexate (RHEUMATREX) 2.5 MG tablet 25 mg once a week. On wednesday Caution:Chemotherapy. Protect from light.      . Nebulizers (COMPRESSOR/NEBULIZER) MISC Use up to 4 times daily when needed  1 each  0  . potassium chloride (KLOR-CON 10) 10 MEQ tablet Take 1 tablet (10 mEq total) by mouth 2 (two) times daily.  90 tablet  3  . [DISCONTINUED] doxepin (SINEQUAN) 10 MG capsule 1-2 at bedtime       . [DISCONTINUED] fexofenadine (ALLEGRA) 180 MG tablet Take 180 mg by mouth daily.         No current facility-administered medications on file prior to visit.   Review of Systems Constitutional: Negative for diaphoresis, activity change, appetite change or unexpected weight change.  HENT: Negative for hearing loss, ear pain, facial swelling, mouth sores and neck stiffness.   Eyes: Negative for pain, redness and visual disturbance.  Respiratory: Negative for shortness of breath and wheezing.   Cardiovascular: Negative for chest pain and palpitations.  Gastrointestinal: Negative for diarrhea, blood in stool, abdominal distention or other pain Genitourinary: Negative for hematuria, flank pain or change in urine volume.  Musculoskeletal: Negative for myalgias and joint swelling.  Skin: Negative for color change and wound.  Neurological: Negative for syncope and numbness. other than noted Hematological: Negative for adenopathy.  Psychiatric/Behavioral: Negative for hallucinations, self-injury, decreased concentration and agitation.      Objective:   Physical Exam BP 132/84  Pulse 90  Temp(Src) 97.4 F (36.3 C) (Oral)  Ht 5\' 4"  (1.626 m)  Wt 263 lb 12 oz (119.636 kg)  BMI 45.25 kg/m2  SpO2 94% VS noted, on home o2 Constitutional: Pt is oriented to person, place, and time. Appears well-developed and well-nourished/morbid obese.  Head: Normocephalic and atraumatic.  Right Ear: External ear normal.  Left Ear: External ear normal.  Nose: Nose normal.  Mouth/Throat: Oropharynx is clear and  moist.  Eyes: Conjunctivae and EOM are normal. Pupils are equal, round, and reactive to light.  Neck: Normal range of motion. Neck supple. No JVD present. No tracheal deviation present.  Cardiovascular: Normal rate, regular rhythm, normal heart sounds and intact distal pulses.   Pulmonary/Chest: Effort normal and breath sounds decresaed bilat.  Abdominal: Soft. Bowel sounds are normal. There is no tenderness. No HSM  Musculoskeletal: Normal range of motion. Exhibits no edema.  Lymphadenopathy:  Has no cervical adenopathy.  Neurological: Pt is alert and oriented to person, place, and time. Pt has normal reflexes. No cranial nerve deficit.  Skin: Skin is warm and dry. No rash noted.  Psychiatric:  Has  normal mood and affect. Behavior is normal. **Note De-Identified Zehren Obfuscation** Assessment & Plan:

## 2013-10-16 NOTE — Progress Notes (Signed)
**Note De-identified Becka Obfuscation** Pre-visit discussion using our clinic review tool. No additional management support is needed unless otherwise documented below in the visit note.  

## 2013-10-16 NOTE — Patient Instructions (Addendum)
**Note De-Identified Gutterman Obfuscation** You had the new Prevnar pneumonia shot today  Please continue all other medications as before, and refills have been done if requested - the ativan  Please have the pharmacy call with any other refills you may need. Please continue your efforts at being more active, low cholesterol diet, and weight control. You are otherwise up to date with prevention measures today.  Please go to the LAB in the Basement (turn left off the elevator) for the tests to be done today  You will be contacted by phone if any changes need to be made immediately.  Otherwise, you will receive a letter about your results with an explanation, but please check with MyChart first.  Please return in 6 months, or sooner if needed

## 2013-10-16 NOTE — Assessment & Plan Note (Signed)
**Note De-identified Manske Obfuscation** Overall doing well, age appropriate education and counseling updated, referrals for preventative services and immunizations addressed, dietary and smoking counseling addressed, most recent labs reviewed.  I have personally reviewed and have noted:  1) the patient's medical and social history 2) The pt's use of alcohol, tobacco, and illicit drugs 3) The patient's current medications and supplements 4) Functional ability including ADL's, fall risk, home safety risk, hearing and visual impairment 5) Diet and physical activities 6) Evidence for depression or mood disorder 7) The patient's height, weight, and BMI have been recorded in the chart  I have made referrals, and provided counseling and education based on review of the above  

## 2013-11-01 NOTE — Assessment & Plan Note (Signed)
**Note De-Identified Olivar Obfuscation** Plan-nnasal nebulizer decongestion, Depo-Medrol, Augmentin

## 2013-11-01 NOTE — Assessment & Plan Note (Signed)
**Note De-identified Sugrue Obfuscation** Good compliance and control 

## 2013-11-01 NOTE — Assessment & Plan Note (Signed)
**Note De-Identified Tamburri Obfuscation** She continues to smoke against advice. Plan-continue present meds.

## 2013-11-01 NOTE — Assessment & Plan Note (Addendum)
**Note De-Identified Buth Obfuscation** Not making any effective effort to quit. Counseling done Plan-she accepts a trial of Chantix

## 2013-12-03 ENCOUNTER — Telehealth: Payer: Self-pay | Admitting: Internal Medicine

## 2013-12-03 MED ORDER — DOXYCYCLINE HYCLATE 100 MG PO TABS: ORAL_TABLET | ORAL | Status: DC

## 2013-12-03 NOTE — Telephone Encounter (Signed)
**Note De-Identified Giannini Obfuscation** Spoke with pt. Thinks she might have the flu. Reports runny nose and eyes, sinus congestion, cough and possible fever. Mucus is light yellow in color. Onset was 1 week ago. Has not tried any OTC meds. Would like something called in.  Allergies  Allergen Reactions  . Milnacipran     REACTION: dizzy  . Tramadol     Did not work    Current Outpatient Prescriptions on File Prior to Visit  Medication Sig Dispense Refill  . albuterol (PROVENTIL) (2.5 MG/3ML) 0.083% nebulizer solution Take 3 mLs (2.5 mg total) by nebulization every 4 (four) hours as needed for wheezing or shortness of breath. DX: 496  180 vial  prn  . atorvastatin (LIPITOR) 40 MG tablet Take 1 tablet (40 mg total) by mouth daily.  90 tablet  3  . budesonide-formoterol (SYMBICORT) 160-4.5 MCG/ACT inhaler Inhale 2 puffs into the lungs daily.       . clotrimazole (LOTRIMIN) 1 % cream APPLY AS DIRECTED BY YOUR PHYSICIAN.     -FOR TOPICAL USE-  30 g  1  . colchicine 0.6 MG tablet Take 1 tablet (0.6 mg total) by mouth daily.  90 tablet  3  . folic acid (FOLVITE) 1 MG tablet Take 1 mg by mouth daily.       Marland Kitchen gabapentin (NEURONTIN) 300 MG capsule 1-2 tabs by mouth at night as needed for pain  180 capsule  1  . HYDROcodone-acetaminophen (NORCO/VICODIN) 5-325 MG per tablet Take 1 tablet by mouth 2 (two) times daily as needed. To fill Oct 11, 2013  60 tablet  0  . LORazepam (ATIVAN) 0.5 MG tablet Take 1 tablet (0.5 mg total) by mouth 2 (two) times daily as needed. For anxiety  60 tablet  5  . methotrexate (RHEUMATREX) 2.5 MG tablet 25 mg once a week. On wednesday Caution:Chemotherapy. Protect from light.      . Nebulizers (COMPRESSOR/NEBULIZER) MISC Use up to 4 times daily when needed  1 each  0  . omeprazole (PRILOSEC) 20 MG capsule Take 1 capsule (20 mg total) by mouth daily.  30 capsule  11  . potassium chloride (KLOR-CON 10) 10 MEQ tablet Take 1 tablet (10 mEq total) by mouth 2 (two) times daily.  90 tablet  3  . [DISCONTINUED]  doxepin (SINEQUAN) 10 MG capsule 1-2 at bedtime       . [DISCONTINUED] fexofenadine (ALLEGRA) 180 MG tablet Take 180 mg by mouth daily.         No current facility-administered medications on file prior to visit.    CY - please advise. Thanks.

## 2013-12-03 NOTE — Telephone Encounter (Signed)
**Note De-identified Potocki Obfuscation** Offer doxycycline 100 mg, # 8, 2 today then one daily 

## 2013-12-03 NOTE — Telephone Encounter (Signed)
**Note De-identified Hourihan Obfuscation** Pt is aware of CY's recs. Rx has been sent in. Nothing further is needed. 

## 2013-12-04 ENCOUNTER — Telehealth: Payer: Self-pay | Admitting: Internal Medicine

## 2013-12-04 MED ORDER — AMOXICILLIN-POT CLAVULANATE 875-125 MG PO TABS: 1.0000 | ORAL_TABLET | Freq: Two times a day (BID) | ORAL | Status: DC

## 2013-12-04 NOTE — Telephone Encounter (Signed)
**Note De-Identified Lichty Obfuscation** Spoke with Suanne Marker at [facility]. States that Doxy and Soriatane interact with each other. It is considered to be a severe interaction.  CY - please advise if you would like to change this. Thanks.

## 2013-12-04 NOTE — Telephone Encounter (Signed)
**Note De-Identified Pol Obfuscation** Spoke with Suanne Marker at [facility]. Rx has been changed. Nothing further is needed.

## 2013-12-04 NOTE — Telephone Encounter (Signed)
**Note De-Identified Rahimi Obfuscation** Per CY-lets change to Augmentin 875 mg #14 take 1 po BID no refills.

## 2014-01-11 ENCOUNTER — Other Ambulatory Visit: Payer: Self-pay | Admitting: Internal Medicine

## 2014-01-12 NOTE — Telephone Encounter (Signed)
**Note De-identified Blalock Obfuscation** Done hardcopy to robin  

## 2014-01-12 NOTE — Telephone Encounter (Signed)
**Note De-Identified Smick Obfuscation** Faxed hardcopy for Lorazepam 0.5 mg #60 with 3 RF's to [facility].

## 2014-02-05 ENCOUNTER — Ambulatory Visit (INDEPENDENT_AMBULATORY_CARE_PROVIDER_SITE_OTHER)
Admission: RE | Admit: 2014-02-05 | Discharge: 2014-02-05 | Disposition: A | Discharge status: Home / Self Care | Payer: Medicare Other | Source: Ambulatory Visit | Attending: Internal Medicine | Admitting: Internal Medicine

## 2014-02-05 ENCOUNTER — Encounter: Payer: Self-pay | Admitting: Internal Medicine

## 2014-02-05 ENCOUNTER — Ambulatory Visit (INDEPENDENT_AMBULATORY_CARE_PROVIDER_SITE_OTHER): Payer: Medicare Other | Admitting: Internal Medicine

## 2014-02-05 VITALS — BP 122/70 | HR 86 | Ht 64.5 in | Wt 261.8 lb

## 2014-02-05 DIAGNOSIS — J441 Chronic obstructive pulmonary disease with (acute) exacerbation: Secondary | ICD-10-CM

## 2014-02-05 DIAGNOSIS — J42 Unspecified chronic bronchitis: Secondary | ICD-10-CM

## 2014-02-05 DIAGNOSIS — G4733 Obstructive sleep apnea (adult) (pediatric): Secondary | ICD-10-CM

## 2014-02-05 NOTE — Patient Instructions (Signed)
**Note De-Identified Beasley Obfuscation** Keep trying to stop smoking- it's important!  Order- CXR   dx COPD  Check with [facility] from time to time to see if they have started carrying the portable concentrators.

## 2014-02-05 NOTE — Progress Notes (Signed)
**Note De-Identified Hailey Hernandez Obfuscation** Patient ID: Hailey Hernandez, female    DOB: 1956/03/07, 58 y.o.   MRN: [id number]  HPI 42 female with known hx of chronic bronchitis , OSA/OHS , active smoker   02/05/11- 55 yoFwith chronic bronchitis, tobacco use, OSA, obesity hypoventilation syndrome. Last here December 15, 2010. Since then has continued Symbicort. Comes with sister today. Sleeping 2 nights ago- woke smothered. Had to pull off CPAP. She gradually cleared over several minutes. Not clear if she refluxed, had mucus plug or ??. Today is typical with no residual. Chest congestion varies day to day. Says cough better, not coughing up anything purulent or bloody. Still averaging 1.5 PPD, down from 3 PPD.  Compliant with CPAP- needs new mask.   07/12/2011 Acute OV  Presents for an acute office visit. Complains pain in right side x 3 days--more painful with cough and movement.  cough with green sputum--. She has been sick for 2 weeks, cough, congestion , drainage. Was exposed to URI symptoms at hospital . Started with lots of drippy nose and drainge.  No fever. Initially phoned in a zpack with no improvement. Then started on Omnicef 4 days ago.  Does have some wheezing . Now pain with inspiration esp along right lateral ribs. Tender to touch and with movement. No rash or fever.   08/10/11- 24 yoFwith chronic bronchitis, tobacco use, OSA, obesity hypoventilation syndrome. Several family members here.Marland Kitchen PCP Dr Edrick Oh She describes persistent ear ache on the right, nasal discharge from the left with little bit of bloody mucus. Distal coughing productively with light yellow sputum, no blood. Questions low-grade fever mainly because she sometimes feels chilly. Smoking is down from 3 packs a day to a little under one pack per day. I encouraged her to keep working at. She gives history of a cholesteatoma and told never to put water in her right ear.  12/10/11- 11 yoFwith chronic bronchitis, tobacco use, OSA, obesity hypoventilation syndrome.  Sister  here. Her sister Hailey Hernandez died in her sleep of end-stage COPD complicated by alpha-1 antitrypsin deficiency. We had called in Augmentin which did help some. Complains now of sinus pressure, blowing bloody scabs from her nose, postnasal drip, cough productive of thick mucus. Denies fever, swollen glands, headache. She has decided that she finally wants to quit smoking. Has reduced from 3 packs per day habit to one pack per day. We discussed support, medical reasons and available techniques and answered her questions.  05/15/12-55 yoFwith chronic bronchitis, tobacco use, OSA, obesity hypoventilation syndrome.  Sister here.  Pt states breathing has been "okay" since last seen. Pt c/o increased SOB and allergy trouble with HA . Pt denies prod cough, wheezing and chest tightness.. unhappy with [name] and wants to change to [name]. CPAP 13/ O2 2L/ [facility] Parking requested  COPD assessment test (CAT) score 14/40 CXR 07/12/11- IMPRESSION:  Bronchitic changes. Left base atelectasis.  Original Report Authenticated By: Raelyn Number, M.D.   09/15/12- 56 yoFwith chronic bronchitis, tobacco use, OSA, obesity hypoventilation syndrome.  Sister here. Follows for: pt states last couple of weeks increase sob,productive cough.chest tightness.denies any wheezing.  For 2 weeks her eyes had been itching and she has had frontal headache, watery rhinorrhea and a little bit of epistaxis. Scratchy throat. Questions low-grade fever. Treating with antihistamines. She continues oxygen 2-3 L per minute/ Apothecary. Continues good compliance with CPAP 13+ oxygen 2 L at night. She is making no effort to stop smoking despite our counseling. CXR 05/15/12-reviewed IMPRESSION:  Stable **Note De-Identified Courter Obfuscation** bronchitic changes and interstitial prominence.  Lingular scarring or atelectasis.  Borderline cardiomegaly.  Original Report Authenticated By: Raelyn Number, M.D.   01/30/13-  56 yoFwith chronic bronchitis, tobacco use, OSA,  obesity hypoventilation syndrome.  Sister here. FOLLOWS FOR: states she is having increased SOB Home oxygen [facility] using 3 L at home, 2 L portable. CPAP 13+ oxygen 2 L for sleep Expresses interest in quitting smoking and asks for Chantix which we discussed. Productive cough with some chest congestion, white or yellow sputum. No blood, no chest pain. New problem-3 weeks, aching right upper tibia anteriorly keeps her awake at hurts to bear weight. CXR 11/07/12 IMPRESSION:  Stable chest x-ray with cardiomegaly. No active lung disease.  Original Report Authenticated By: Ivar Drape, M.D.  06/05/13- 57 yoFwith chronic bronchitis, tobacco use, OSA, obesity hypoventilation syndrome.  Sister here. FOLLOWS FOR: Breathing unchanged since last OV.  Needs to discuss oxygen today Home oxygen [facility] using 3 L at home, 2 L portable. CPAP 13+ oxygen 2 L for sleep Anxious about trying Chantix, but decided to try it- discussed. Failed Wellbutrin.  10/09/13- 15 yoFwith chronic bronchitis, tobacco use (1 ppd/ 45 pk yrs), OSA, obesity hypoventilation syndrome.  Sister here. FOLLOWS FOR:  Still having sob, cough (non-productive) and wheezing.  Also, headaches and facial swelling, painfull  to the touch. CPAP 13+ oxygen 2 L for sleep/ [facility] She looked into a portable concentrator but home care can't provide it with her insurance. Upper respiratory infection/sinusitis over Christmas, treated with Keflex which helped but she still has nasal congestion and blows small amounts of blood from her nose without headache or fever.  02/05/14-  1 yoF smoker with chronic bronchitis, tobacco use (1 ppd/ 45 pk yrs), OSA, obesity hypoventilation syndrome. Complicated by psoriasis             Grand daughter here. FOLLOWS FOR:  Reports breathing unchanged since last OV.  Wearing CPAP 13 + O2 2L/ [facility]  Daily cough productive white sputum. Nothing bloody or purulent. She continues to smoke and gave  up on Chantix  Review of Systems-see HPI Constitutional:   No-   weight loss, night sweats, fevers, chills, fatigue, lassitude. HEENT:   + headaches, difficulty swallowing, tooth/dental problems, sore throat,       No-  sneezing, itching,  ear ache,  +nasal congestion, +post nasal drip,  CV:  No-   chest pain, orthopnea, PND, swelling in lower extremities, anasarca,   dizziness, palpitations Resp: +  shortness of breath with exertion or at rest.              + productive cough,  + non-productive cough,  No- coughing up of blood.              No-   change in color of mucus.  No- wheezing.   Skin: psoriasis GI:  No-   heartburn, indigestion, abdominal pain, nausea, vomiting,  GU: No-    MS:  + joint pain or swelling. . Neuro-     nothing unusual Psych:  No- change in mood or affect. No depression or anxiety.  No memory loss.   Objective:   Physical Exam General- Alert, Oriented, Affect-appropriate, Distress- none acute, obese. On 3L O2 sat 93% Skin- +extensive psoriasis,  Lymphadenopathy- none Head- atraumatic            Eyes- Gross vision intact, PERRLA, conjunctivae clear secretions            Ears- Hearing, **Note De-Identified Whitecotton Obfuscation** canals- cerumen/ debris R            Nose- c+ turbinate edema, no-Septal dev, mucus, polyps, erosion, perforation             Throat- Mallampati III , mucosa clear , drainage- none, tonsils- atrophic. Mouth breathing,                      +dentures Neck- flexible , trachea midline, no stridor , thyroid nl, carotid no bruit Chest - symmetrical excursion , unlabored           Heart/CV- RRR , no murmur , no gallop  , no rub, nl s1 s2                           - JVD- none , edema- none, stasis  dermatitischanges+ bilateral, varices- none           Lung- No- cough, rattle +. , dullness-none, rub- none           Chest wall-  Abd-  Br/ Gen/ Rectal- Not done, not indicated Extrem- +apparent lipoma right medial knee with stasis changes but no edema. Neuro- grossly intact to  observation

## 2014-02-12 ENCOUNTER — Other Ambulatory Visit: Payer: Self-pay | Admitting: Internal Medicine

## 2014-03-17 ENCOUNTER — Other Ambulatory Visit: Payer: Self-pay | Admitting: Internal Medicine

## 2014-03-17 NOTE — Assessment & Plan Note (Signed)
**Note De-identified Hergert Obfuscation** Good compliance and control 

## 2014-03-17 NOTE — Telephone Encounter (Signed)
**Note De-identified Linhares Obfuscation** Done erx 

## 2014-03-17 NOTE — Assessment & Plan Note (Addendum)
**Note De-Identified Alvidrez Obfuscation** Persistent bronchitis symptoms without acute exacerbation Plan-smoking cessation, chest x-ray,

## 2014-04-09 ENCOUNTER — Ambulatory Visit: Payer: Medicare Other | Admitting: Internal Medicine

## 2014-04-16 ENCOUNTER — Ambulatory Visit: Payer: Medicare Other | Admitting: Internal Medicine

## 2014-05-07 ENCOUNTER — Ambulatory Visit: Payer: Medicare Other | Admitting: Internal Medicine

## 2014-05-07 DIAGNOSIS — Z0289 Encounter for other administrative examinations: Secondary | ICD-10-CM

## 2014-05-17 DIAGNOSIS — L4050 Arthropathic psoriasis, unspecified: Secondary | ICD-10-CM | POA: Insufficient documentation

## 2014-05-21 ENCOUNTER — Ambulatory Visit (INDEPENDENT_AMBULATORY_CARE_PROVIDER_SITE_OTHER): Payer: Medicare Other | Admitting: Internal Medicine

## 2014-05-21 ENCOUNTER — Encounter: Payer: Self-pay | Admitting: Internal Medicine

## 2014-05-21 ENCOUNTER — Ambulatory Visit (INDEPENDENT_AMBULATORY_CARE_PROVIDER_SITE_OTHER)
Admission: RE | Admit: 2014-05-21 | Discharge: 2014-05-21 | Disposition: A | Discharge status: Home / Self Care | Payer: Medicare Other | Source: Ambulatory Visit | Attending: Internal Medicine | Admitting: Internal Medicine

## 2014-05-21 ENCOUNTER — Ambulatory Visit (INDEPENDENT_AMBULATORY_CARE_PROVIDER_SITE_OTHER): Payer: Medicare Other

## 2014-05-21 VITALS — BP 150/90 | HR 94 | Temp 92.1°F | Wt 256.5 lb

## 2014-05-21 DIAGNOSIS — M79674 Pain in right toe(s): Secondary | ICD-10-CM

## 2014-05-21 DIAGNOSIS — E785 Hyperlipidemia, unspecified: Secondary | ICD-10-CM

## 2014-05-21 DIAGNOSIS — Z23 Encounter for immunization: Secondary | ICD-10-CM

## 2014-05-21 DIAGNOSIS — Z0000 Encounter for general adult medical examination without abnormal findings: Secondary | ICD-10-CM

## 2014-05-21 DIAGNOSIS — M79609 Pain in unspecified limb: Secondary | ICD-10-CM

## 2014-05-21 DIAGNOSIS — R7309 Other abnormal glucose: Secondary | ICD-10-CM

## 2014-05-21 DIAGNOSIS — I10 Essential (primary) hypertension: Secondary | ICD-10-CM

## 2014-05-21 DIAGNOSIS — R7302 Impaired glucose tolerance (oral): Secondary | ICD-10-CM

## 2014-05-21 LAB — HEMOGLOBIN A1C: HEMOGLOBIN A1C: 6.6 % — AB (ref 4.6–6.5)

## 2014-05-21 NOTE — Progress Notes (Signed)
**Note De-Identified Korell Obfuscation** Subjective:    Patient ID: Hailey Hernandez, female    DOB: 11-27-1955, 58 y.o.   MRN: [id number]  HPI Here to f/u; overall doing ok,  Pt denies chest pain, increased sob or doe, wheezing, orthopnea, PND, increased LE swelling, palpitations, dizziness or syncope.  Pt denies polydipsia, polyuria, or low sugar symptoms such as weakness or confusion improved with po intake.  Pt denies new neurological symptoms such as new headache, or facial or extremity weakness or numbness.   Pt states overall good compliance with meds, has been trying to follow lower cholesterol, diabetic diet, with wt overall stable,  but little exercise however.\  Lost 7 lbs intentionally.  Did fall out of bed 2 days ago with sleep, now with 5th toe right foot red/tender/swelling Past Medical History  Diagnosis Date  . Tachyarrhythmia   . Tobacco abuse   . Chronic bronchitis   . History of thyroid cancer   . Rhinitis   . Polymyalgia   . Diverticulitis   . DJD (degenerative joint disease)     spine  . Arthritis   . COPD (chronic obstructive pulmonary disease)   . Hyperlipidemia   . GERD (gastroesophageal reflux disease)   . Fibromyalgia   . Arthritis     bil legs  . Asthma   . Glaucoma   . Allergy   . Anxiety disorder   . Depression   . Cancer   . Complication of anesthesia     20 yrs ago turned blue after surgery  . HTN (hypertension)     no longer on BP medication  . Shortness of breath   . OSA (obstructive sleep apnea)     CPAP  last sleep study 8-10 yr.ag0  . Psoriasis   . Impaired glucose tolerance 05/08/2013  . Atrial fibrillation    Past Surgical History  Procedure Laterality Date  . Total abdominal hysterectomy    . Cholecystectomy    . Appendectomy    . Carpal tunnel release    . Left shoulder spurs    . Orif right lower leg    . Cholesteatoma excision      right ear  . Carpal tunnel release  10/30/2011    Procedure: CARPAL TUNNEL RELEASE;  Surgeon: Wynonia Sours, MD;  Location: [facility];  Service: Orthopedics;  Laterality: Right;  and mass excision  . Partial throidectomy      reports that she has been smoking Cigarettes.  She has a 45 pack-year smoking history. She has never used smokeless tobacco. She reports that she does not drink alcohol or use illicit drugs. family history includes Allergies in her sister; Alpha-1 antitrypsin deficiency in her sister and sister; Asthma in her sister; Cancer in her brother, mother, and sister; Emphysema in her sister and sister; Heart attack in her father, mother, and sister; Heart disease in her father and mother; Hyperlipidemia in her brother, daughter, and mother; Hypertension in her brother, daughter, mother, sister, and son; Tuberculosis in her other. Allergies  Allergen Reactions  . Doxycycline Other (See Comments)    Drug interaction with Soriatane  . Milnacipran     REACTION: dizzy  . Tramadol     Did not work   Current Outpatient Prescriptions on File Prior to Visit  Medication Sig Dispense Refill  . albuterol (PROVENTIL) (2.5 MG/3ML) 0.083% nebulizer solution Take 3 mLs (2.5 mg total) by nebulization every 4 (four) hours as needed for wheezing or shortness of breath. DX: 496 **Note De-Identified Thayne Obfuscation** 180 vial  prn  . atorvastatin (LIPITOR) 40 MG tablet Take 1 tablet (40 mg total) by mouth daily.  90 tablet  3  . budesonide-formoterol (SYMBICORT) 160-4.5 MCG/ACT inhaler Inhale 2 puffs into the lungs daily.       . clotrimazole (LOTRIMIN) 1 % cream APPLY AS DIRECTED BY YOUR PHYSICIAN. -FOR TOPICAL USE-  30 g  1  . folic acid (FOLVITE) 1 MG tablet Take 1 mg by mouth daily.       Marland Kitchen gabapentin (NEURONTIN) 300 MG capsule TAKE 1 TO 2 CAPSULES AT NIGHT AS NEEDED FOR PAIN  180 capsule  1  . LORazepam (ATIVAN) 0.5 MG tablet TAKE 1 TABLET TWICE DAILY AS NEEDED FOR ANXIETY  60 tablet  3  . methotrexate (RHEUMATREX) 2.5 MG tablet 25 mg once a week. On wednesday Caution:Chemotherapy. Protect from light.      . Nebulizers (COMPRESSOR/NEBULIZER) MISC  Use up to 4 times daily when needed  1 each  0  . omeprazole (PRILOSEC) 20 MG capsule Take 1 capsule (20 mg total) by mouth daily.  30 capsule  11  . potassium chloride (KLOR-CON 10) 10 MEQ tablet Take 1 tablet (10 mEq total) by mouth 2 (two) times daily.  90 tablet  3  . [DISCONTINUED] doxepin (SINEQUAN) 10 MG capsule 1-2 at bedtime       . [DISCONTINUED] fexofenadine (ALLEGRA) 180 MG tablet Take 180 mg by mouth daily.         No current facility-administered medications on file prior to visit.   Review of Systems  Constitutional: Negative for unusual diaphoresis or other sweats  HENT: Negative for ringing in ear Eyes: Negative for double vision or worsening visual disturbance.  Respiratory: Negative for choking and stridor.   Gastrointestinal: Negative for vomiting or other signifcant bowel change Genitourinary: Negative for hematuria or decreased urine volume.  Musculoskeletal: Negative for other MSK pain or swelling Skin: Negative for color change and worsening wound.  Neurological: Negative for tremors and numbness other than noted  Psychiatric/Behavioral: Negative for decreased concentration or agitation other than above       Objective:   Physical Exam BP 150/90  Pulse 94  Temp(Src) 92.1 F (33.4 C) (Oral)  Wt 256 lb 8 oz (116.348 kg)  SpO2 94% VS noted,  Constitutional: Pt appears well-developed, well-nourished.  HENT: Head: NCAT.  Right Ear: External ear normal.  Left Ear: External ear normal.  Eyes: . Pupils are equal, round, and reactive to light. Conjunctivae and EOM are normal Neck: Normal range of motion. Neck supple.  Cardiovascular: Normal rate and regular rhythm.   Pulmonary/Chest: Effort normal and breath sounds decreased , no rales or wheezing.  Neurological: Pt is alert. Not confused , motor grossly intact Skin: Skin is warm. No rash Right 5th toe with 2+ tender, swelling, red Psychiatric: Pt behavior is normal. No agitation.     Assessment & Plan:    Wt Readings from Last 3 Encounters:  05/21/14 256 lb 8 oz (116.348 kg)  02/05/14 261 lb 12.8 oz (118.752 kg)  10/16/13 263 lb 12 oz (119.636 kg)

## 2014-05-21 NOTE — Patient Instructions (Addendum)
**Note De-Identified Lahman Obfuscation** You had the flu shot today  Please continue all other medications as before, and refills have been done if requested.  Please have the pharmacy call with any other refills you may need.  Please continue your efforts at being more active, low cholesterol diet, and weight control.  Please keep your appointments with your specialists as you may have planned  Please go to the XRAY Department in the Basement (go straight as you get off the elevator) for the x-ray testing  - for the toe  Please go to the LAB in the Basement (turn left off the elevator) for the tests to be done today  You will be contacted by phone if any changes need to be made immediately.  Otherwise, you will receive a letter about your results with an explanation, but please check with MyChart first.  Please remember to sign up for MyChart if you have not done so, as this will be important to you in the future with finding out test results, communicating by private email, and scheduling acute appointments online when needed.

## 2014-05-21 NOTE — Progress Notes (Signed)
**Note De-identified Klinker Obfuscation** Pre visit review using our clinic review tool, if applicable. No additional management support is needed unless otherwise documented below in the visit note. 

## 2014-05-22 LAB — HEPATIC FUNCTION PANEL
ALT: 31 U/L (ref 0–35)
AST: 30 U/L (ref 0–37)
Albumin: 3.7 g/dL (ref 3.5–5.2)
Alkaline Phosphatase: 132 U/L — ABNORMAL HIGH (ref 39–117)
BILIRUBIN TOTAL: 0.6 mg/dL (ref 0.2–1.2)
Bilirubin, Direct: 0.1 mg/dL (ref 0.0–0.3)
Total Protein: 7.7 g/dL (ref 6.0–8.3)

## 2014-05-22 LAB — LIPID PANEL
CHOL/HDL RATIO: 8
Cholesterol: 269 mg/dL — ABNORMAL HIGH (ref 0–200)
HDL: 32.7 mg/dL — ABNORMAL LOW (ref >39.00)
NonHDL: 236.3
Triglycerides: 478 mg/dL — ABNORMAL HIGH (ref 0.0–149.0)
VLDL: 95.6 mg/dL — ABNORMAL HIGH (ref 0.0–40.0)

## 2014-05-22 LAB — BASIC METABOLIC PANEL
BUN: 11 mg/dL (ref 6–23)
CHLORIDE: 98 meq/L (ref 96–112)
CO2: 32 mEq/L (ref 19–32)
Calcium: 8.9 mg/dL (ref 8.4–10.5)
Creatinine, Ser: 0.6 mg/dL (ref 0.4–1.2)
GFR: 114.88 mL/min (ref >60.00)
Glucose, Bld: 212 mg/dL — ABNORMAL HIGH (ref 70–99)
POTASSIUM: 4.1 meq/L (ref 3.5–5.1)
Sodium: 139 mEq/L (ref 135–145)

## 2014-05-22 LAB — LDL CHOLESTEROL, DIRECT: LDL DIRECT: 197.7 mg/dL

## 2014-05-23 NOTE — Assessment & Plan Note (Addendum)
**Note De-Identified Mcgilvray Obfuscation** stable overall by history and exam,  and pt to continue medical treatment as before,  to f/u any worsening symptoms or concerns, for f/u lipids, consider statin start

## 2014-05-23 NOTE — Assessment & Plan Note (Signed)
**Note De-Identified Lamantia Obfuscation** prob fx - for film, buddy tape,  to f/u any worsening symptoms or concerns

## 2014-05-23 NOTE — Assessment & Plan Note (Signed)
**Note De-Identified Vacha Obfuscation** stable overall by history and exam, recent data reviewed with pt, and pt to continue medical treatment as before,  to f/u any worsening symptoms or concerns Lab Results  Component Value Date   HGBA1C 6.6* 05/21/2014

## 2014-05-23 NOTE — Assessment & Plan Note (Signed)
**Note De-Identified Gibeault Obfuscation** Mild elev today , stable overall by history and exam, recent data reviewed with pt, and pt to continue medical treatment as before,  to f/u any worsening symptoms or concerns, declines further change in tx BP Readings from Last 3 Encounters:  05/21/14 150/90  02/05/14 122/70  10/16/13 132/84

## 2014-05-24 ENCOUNTER — Other Ambulatory Visit: Payer: Self-pay | Admitting: Internal Medicine

## 2014-05-25 NOTE — Telephone Encounter (Signed)
**Note De-Identified Molzahn Obfuscation** Faxed hardcopy to [facility]

## 2014-05-25 NOTE — Telephone Encounter (Signed)
**Note De-identified Delrosario Obfuscation** Done hardcopy to robin  

## 2014-05-27 ENCOUNTER — Other Ambulatory Visit: Payer: Self-pay | Admitting: Internal Medicine

## 2014-05-27 MED ORDER — ATORVASTATIN CALCIUM 40 MG PO TABS: 40.0000 mg | ORAL_TABLET | Freq: Every day | ORAL | Status: DC

## 2014-06-09 ENCOUNTER — Telehealth: Payer: Self-pay

## 2014-06-09 DIAGNOSIS — Z0000 Encounter for general adult medical examination without abnormal findings: Secondary | ICD-10-CM

## 2014-06-09 NOTE — Telephone Encounter (Signed)
**Note De-Identified Gailey Obfuscation** Called pt//lmovm to call back, need to know if she has had a recent mammogram and if not is it ok to proceed with referral.

## 2014-06-11 ENCOUNTER — Ambulatory Visit: Payer: Medicare Other | Admitting: Internal Medicine

## 2014-06-11 NOTE — Telephone Encounter (Signed)
**Note De-identified Grell Obfuscation** Mammogram ordered

## 2014-07-08 ENCOUNTER — Other Ambulatory Visit: Payer: Self-pay | Admitting: Internal Medicine

## 2014-07-08 DIAGNOSIS — Z1231 Encounter for screening mammogram for malignant neoplasm of breast: Secondary | ICD-10-CM

## 2014-07-16 ENCOUNTER — Other Ambulatory Visit: Payer: Self-pay

## 2014-07-23 ENCOUNTER — Encounter: Payer: Self-pay | Admitting: Internal Medicine

## 2014-07-23 ENCOUNTER — Ambulatory Visit (INDEPENDENT_AMBULATORY_CARE_PROVIDER_SITE_OTHER): Payer: Medicare Other | Admitting: Internal Medicine

## 2014-07-23 VITALS — BP 144/72 | HR 121 | Ht 64.0 in | Wt 259.6 lb

## 2014-07-23 DIAGNOSIS — E662 Morbid (severe) obesity with alveolar hypoventilation: Secondary | ICD-10-CM

## 2014-07-23 DIAGNOSIS — L400 Psoriasis vulgaris: Secondary | ICD-10-CM

## 2014-07-23 DIAGNOSIS — G4733 Obstructive sleep apnea (adult) (pediatric): Secondary | ICD-10-CM

## 2014-07-23 DIAGNOSIS — J449 Chronic obstructive pulmonary disease, unspecified: Secondary | ICD-10-CM

## 2014-07-23 DIAGNOSIS — Z720 Tobacco use: Secondary | ICD-10-CM

## 2014-07-23 DIAGNOSIS — F17200 Nicotine dependence, unspecified, uncomplicated: Secondary | ICD-10-CM

## 2014-07-23 NOTE — Progress Notes (Signed)
**Note De-Identified Corkery Obfuscation** Patient ID: Hailey Hernandez, female    DOB: [birth year], 58 y.o.   MRN: [id number]  HPI 5 female with known hx of chronic bronchitis , OSA/OHS , active smoker   02/05/11- 55 yoFwith chronic bronchitis, tobacco use, OSA, obesity hypoventilation syndrome. Last here December 15, 2010. Since then has continued Symbicort. Comes with sister today. Sleeping 2 nights ago- woke smothered. Had to pull off CPAP. She gradually cleared over several minutes. Not clear if she refluxed, had mucus plug or ??. Today is typical with no residual. Chest congestion varies day to day. Says cough better, not coughing up anything purulent or bloody. Still averaging 1.5 PPD, down from 3 PPD.  Compliant with CPAP- needs new mask.   07/12/2011 Acute OV  Presents for an acute office visit. Complains pain in right side x 3 days--more painful with cough and movement.  cough with green sputum--. She has been sick for 2 weeks, cough, congestion , drainage. Was exposed to URI symptoms at hospital . Started with lots of drippy nose and drainge.  No fever. Initially phoned in a zpack with no improvement. Then started on Omnicef 4 days ago.  Does have some wheezing . Now pain with inspiration esp along right lateral ribs. Tender to touch and with movement. No rash or fever.   08/10/11- 38 yoFwith chronic bronchitis, tobacco use, OSA, obesity hypoventilation syndrome. Several family members here.Marland Kitchen PCP Dr Hailey Hernandez She describes persistent ear ache on the right, nasal discharge from the left with little bit of bloody mucus. Distal coughing productively with light yellow sputum, no blood. Questions low-grade fever mainly because she sometimes feels chilly. Smoking is down from 3 packs a day to a little under one pack per day. I encouraged her to keep working at. She gives history of a cholesteatoma and told never to put water in her right ear.  12/10/11- 51 yoFwith chronic bronchitis, tobacco use, OSA, obesity hypoventilation syndrome.  Sister  here. Her sister Hailey Hernandez died in her sleep of end-stage COPD complicated by alpha-1 antitrypsin deficiency. We had called in Augmentin which did help some. Complains now of sinus pressure, blowing bloody scabs from her nose, postnasal drip, cough productive of thick mucus. Denies fever, swollen glands, headache. She has decided that she finally wants to quit smoking. Has reduced from 3 packs per day habit to one pack per day. We discussed support, medical reasons and available techniques and answered her questions.  05/15/12-55 yoFwith chronic bronchitis, tobacco use, OSA, obesity hypoventilation syndrome.  Sister here.  Pt states breathing has been "okay" since last seen. Pt c/o increased SOB and allergy trouble with HA . Pt denies prod cough, wheezing and chest tightness.. unhappy with [name] and wants to change to [name]. CPAP 13/ O2 2L/ [facility] Parking requested  COPD assessment test (CAT) score 14/40 CXR 07/12/11- IMPRESSION:  Bronchitic changes. Left base atelectasis.  Original Report Authenticated By: Hailey Hernandez, M.D.   09/15/12- 56 yoFwith chronic bronchitis, tobacco use, OSA, obesity hypoventilation syndrome.  Sister here. Follows for: pt states last couple of weeks increase sob,productive cough.chest tightness.denies any wheezing.  For 2 weeks her eyes had been itching and she has had frontal headache, watery rhinorrhea and a little bit of epistaxis. Scratchy throat. Questions low-grade fever. Treating with antihistamines. She continues oxygen 2-3 L per minute/[facility]. Continues good compliance with CPAP 13+ oxygen 2 L at night. She is making no effort to stop smoking despite our counseling. CXR 05/15/12-reviewed IMPRESSION:  Stable **Note De-Identified Sanz Obfuscation** bronchitic changes and interstitial prominence.  Lingular scarring or atelectasis.  Borderline cardiomegaly.  Original Report Authenticated By: Hailey Hernandez, M.D.   01/30/13-  56 yoFwith chronic bronchitis, tobacco use, OSA,  obesity hypoventilation syndrome.  Sister here. FOLLOWS FOR: states she is having increased SOB Home oxygen [facility] using 3 L at home, 2 L portable. CPAP 13+ oxygen 2 L for sleep Expresses interest in quitting smoking and asks for Chantix which we discussed. Productive cough with some chest congestion, white or yellow sputum. No blood, no chest pain. New problem-3 weeks, aching right upper tibia anteriorly keeps her awake at hurts to bear weight. CXR 11/07/12 IMPRESSION:  Stable chest x-ray with cardiomegaly. No active lung disease.  Original Report Authenticated By: Hailey Hernandez, M.D.  06/05/13- 57 yoFwith chronic bronchitis, tobacco use, OSA, obesity hypoventilation syndrome.  Sister here. FOLLOWS FOR: Breathing unchanged since last OV.  Needs to discuss oxygen today Home oxygen [facility] using 3 L at home, 2 L portable. CPAP 13+ oxygen 2 L for sleep Anxious about trying Chantix, but decided to try it- discussed. Failed Wellbutrin.  10/09/13- 57 yoFwith chronic bronchitis, tobacco use (1 ppd/ 45 pk yrs), OSA, obesity hypoventilation syndrome.  Sister here. FOLLOWS FOR:  Still having sob, cough (non-productive) and wheezing.  Also, headaches and facial swelling, painfull  to the touch. CPAP 13+ oxygen 2 L for sleep/ [facility] She looked into a portable concentrator but home care can't provide it with her insurance. Upper respiratory infection/sinusitis over Christmas, treated with Keflex which helped but she still has nasal congestion and blows small amounts of blood from her nose without headache or fever.  02/05/14-  62 yoF smoker with chronic bronchitis, tobacco use (1 ppd/ 45 pk yrs), OSA, obesity hypoventilation syndrome. Complicated by psoriasis             Grand daughter here. FOLLOWS FOR:  Reports breathing unchanged since last OV.  Wearing CPAP 13 + O2 2L/ [facility]  Daily cough productive white sputum. Nothing bloody or purulent. She continues to smoke and gave  up on Chantix  07/23/14- 58 yoF smoker with chronic bronchitis/ COPD, tobacco use (1 ppd/ 45 pk yrs), OSA, obesity hypoventilation syndrome. Complicated by psoriasis             Grand daughter here. FOLLOW FOR: COPD;  CXR 02/05/14 IMPRESSION:  No segmental infiltrate or pulmonary edema. Slight worsening central  bronchitic changes. Stable chronic interstitial prominence.  Electronically Signed  By: Lahoma Crocker M.D.  On: 02/05/2014 13:31  07/23/14- 70 yoF smoker with chronic bronchitis/ COPD, tobacco use (1 ppd/ 45 pk yrs), OSA, obesity hypoventilation syndrome. Complicated by psoriasis             Sister here. FOLLOW FOR: COPD; SOB; wheezing Nasal drainage, cough prod white.  On methotrexate and Cosentyx for psoriasis Avoids lasix- makes gout flare up. Admits fluid retention. Continues CPAP 13/ O2 2L. Wants to change back to [place] from [name] for better service.  Review of Systems-see HPI Constitutional:   No-   weight loss, night sweats, fevers, chills, fatigue, lassitude. HEENT:   + headaches, difficulty swallowing, tooth/dental problems, sore throat,       No-  sneezing, itching,  ear ache,  +nasal congestion, +post nasal drip,  CV:  No-   chest pain, orthopnea, PND, +swelling in lower extremities, anasarca,   dizziness, palpitations Resp: +  shortness of breath with exertion or at rest.              + **Note De-Identified Haegele Obfuscation** productive cough,  + non-productive cough,  No- coughing up of blood.              No-   change in color of mucus.  + wheezing.   Skin: psoriasis GI:  No-   heartburn, indigestion, abdominal pain, nausea, vomiting,  GU: No-    MS:  + joint pain or swelling. . Neuro-     nothing unusual Psych:  No- change in mood or affect. No depression or anxiety.  No memory loss.   Objective:   Physical Exam General- Alert, Oriented, Affect-appropriate, Distress- none acute, obese. On 3L O2 sat 89% Skin- +extensive psoriasis,  Lymphadenopathy- none Head- atraumatic             Eyes- Gross vision intact, PERRLA, conjunctivae clear secretions            Ears- Hearing, canals- cerumen/ debris R            Nose- + turbinate edema, no-Septal dev, mucus, polyps, erosion, perforation             Throat- Mallampati III , mucosa clear , drainage- none, tonsils- atrophic.                        +dentures Neck- flexible , trachea midline, no stridor , thyroid nl, carotid no bruit Chest - symmetrical excursion , unlabored           Heart/CV- RRR , no murmur , no gallop  , no rub, nl s1 s2                           - JVD- none , edema- none, stasis  dermatitischanges+ bilateral, varices- none           Lung- loose rhonchi +, dullness-none, rub- none, + shallow tachypnea           Chest wall-  Abd-  Br/ Gen/ Rectal- Not done, not indicated Extrem- +apparent lipoma right medial knee with stasis changes Neuro- grossly intact to observation

## 2014-07-23 NOTE — Patient Instructions (Signed)
**Note De-Identified Denbow Obfuscation** Set your O2 on 3l to keep saturation 90-945  [facility] would like [facility] change DME back to [place] for CPAP 13, mask of choice, humidifier, supplies, dx OSA                                                                                                          O2 3L/ min      Dx Chronic hypoxic respiratory failure, COPD w chronic bronchitis  Ask your doctor if he has a different class of diuretic you can try instead of lasix, which makes your gout flare up.

## 2014-07-25 NOTE — Assessment & Plan Note (Signed)
**Note De-Identified Yurkovich Obfuscation** Immunosuppressive therapy

## 2014-07-25 NOTE — Assessment & Plan Note (Signed)
**Note De-Identified Hillhouse Obfuscation** Not trying

## 2014-07-25 NOTE — Assessment & Plan Note (Addendum)
**Note De-Identified Biegel Obfuscation** Probable chronic hypoxic respiratory failure.O2 now 3L. Arrival sat 89% this visit w/o infection- may be from fluid retention.

## 2014-07-25 NOTE — Assessment & Plan Note (Signed)
**Note De-Identified Holmer Obfuscation** Probable, based on observation Plan- weight loss encouraged

## 2014-07-30 ENCOUNTER — Ambulatory Visit: Payer: Medicare Other

## 2014-08-12 ENCOUNTER — Ambulatory Visit: Payer: Medicare Other

## 2014-08-12 ENCOUNTER — Encounter: Payer: Self-pay | Admitting: Internal Medicine

## 2014-08-12 DIAGNOSIS — J449 Chronic obstructive pulmonary disease, unspecified: Secondary | ICD-10-CM

## 2014-08-13 ENCOUNTER — Telehealth: Payer: Self-pay | Admitting: Internal Medicine

## 2014-08-13 DIAGNOSIS — G4733 Obstructive sleep apnea (adult) (pediatric): Secondary | ICD-10-CM

## 2014-08-13 NOTE — Telephone Encounter (Signed)
**Note De-Identified Scaturro Obfuscation** Oxygen Verification of Referral intake information form and a copy of the CMN for pt's o2 faxed to [name], Attn: [name] or Arbie Cookey. Faxed to [phone]. LMOAM for [name] that this has been faxed to [place]. Advised to contact us if anything else is needed for this patient. Rhonda J Cobb

## 2014-08-13 NOTE — Telephone Encounter (Signed)
**Note De-Identified Paver Obfuscation** Confirmation received from fax that information faxed to [name] was received. Nothing else needed at this time. Rhonda J Cobb

## 2014-08-13 NOTE — Telephone Encounter (Signed)
**Note De-Identified Loudin Obfuscation** [name] states that they need qualifying sats for O2-pt will need to be 88% or less on room air.  [name] can be reached at [phone].  Hailey Hernandez

## 2014-08-13 NOTE — Telephone Encounter (Signed)
**Note De-Identified Fanelli Obfuscation** Pt requested to switch DME companies at last [place] from [place] to [place]. Returned [employer] from [place].  Lynn Ito is requesting clarification and records to allow the pt to have an increase to 3lpm with CPAP. New order placed for the O2 with CPAP. Called and spoke to [place] with [facility] to see if the records were faxed to [facility]  or if the process of the switch have been initiated and they have no records indicating the pt is switching DME companies. Order is in [place] stating the orders and records were faxed to [place].   PCC's please advise.

## 2014-08-19 ENCOUNTER — Encounter: Payer: Self-pay | Admitting: Internal Medicine

## 2014-08-19 DIAGNOSIS — E662 Morbid (severe) obesity with alveolar hypoventilation: Secondary | ICD-10-CM

## 2014-08-19 DIAGNOSIS — J449 Chronic obstructive pulmonary disease, unspecified: Secondary | ICD-10-CM

## 2014-08-19 NOTE — Telephone Encounter (Signed)
**Note De-Identified Holmer Obfuscation** Call [facility] in AM

## 2014-08-20 NOTE — Telephone Encounter (Signed)
**Note De-Identified Faerber Obfuscation** Spoke with [facility]-pt will need qualifying sats as she uses O2 during the day and and ONO to keep her O2 at night.  Pt can only come in on Friday 08-27-14; as I am off that day Mathis Dad will do quick O2 test for me. Pt is aware of this and will be here at 10:00am I have placed order for ONO and placed patient on Injection schedule for O2 check.

## 2014-08-27 ENCOUNTER — Telehealth: Payer: Self-pay | Admitting: Internal Medicine

## 2014-08-27 ENCOUNTER — Encounter (INDEPENDENT_AMBULATORY_CARE_PROVIDER_SITE_OTHER): Payer: Self-pay

## 2014-08-27 DIAGNOSIS — Z23 Encounter for immunization: Secondary | ICD-10-CM

## 2014-08-27 DIAGNOSIS — J449 Chronic obstructive pulmonary disease, unspecified: Secondary | ICD-10-CM

## 2014-08-27 DIAGNOSIS — J441 Chronic obstructive pulmonary disease with (acute) exacerbation: Secondary | ICD-10-CM

## 2014-08-27 NOTE — Telephone Encounter (Signed)
**Note De-Identified Biehler Obfuscation** Qualifies for Home O2 2-3L continuous, based on 11/27 walk test    Dx COPD mixed type   Please forward to [employer]

## 2014-08-27 NOTE — Telephone Encounter (Signed)
**Note De-identified Tejera Obfuscation** Deleted

## 2014-08-27 NOTE — Telephone Encounter (Signed)
**Note De-Identified Wilmeth Obfuscation** Patient came in today for O2 recertification.  Patient immediately deSat once taken off O2.   Patients Sat on RA: 87% Patients Sat amb on RA: 88%  Patients Sat on 2L Amb: 87%  Patients Sat at rest on 2L: 90%

## 2014-08-30 NOTE — Progress Notes (Deleted)
**Note De-identified Dority Obfuscation** Nothing further needed 

## 2014-09-08 ENCOUNTER — Encounter: Payer: Self-pay | Admitting: Internal Medicine

## 2014-09-10 ENCOUNTER — Ambulatory Visit
Admission: RE | Admit: 2014-09-10 | Discharge: 2014-09-10 | Disposition: A | Discharge status: Home / Self Care | Payer: Medicare Other | Source: Ambulatory Visit | Attending: Internal Medicine | Admitting: Internal Medicine

## 2014-09-10 DIAGNOSIS — Z1231 Encounter for screening mammogram for malignant neoplasm of breast: Secondary | ICD-10-CM

## 2014-09-10 NOTE — Telephone Encounter (Signed)
**Note De-Identified Revard Obfuscation** Spoke with Arbie Cookey with [name] this morning and patient's o2 will be delivered today.  Called and spoke with patient yesterday and she stated that [facility] did contact her and she is aware that o2 will be delivered today. Nothing else needed at this time. Rhonda J Cobb

## 2014-09-14 ENCOUNTER — Other Ambulatory Visit: Payer: Self-pay | Admitting: Internal Medicine

## 2014-09-14 DIAGNOSIS — R928 Other abnormal and inconclusive findings on diagnostic imaging of breast: Secondary | ICD-10-CM

## 2014-09-29 ENCOUNTER — Ambulatory Visit
Admission: RE | Admit: 2014-09-29 | Discharge: 2014-09-29 | Disposition: A | Discharge status: Home / Self Care | Payer: Medicare Other | Source: Ambulatory Visit | Attending: Internal Medicine | Admitting: Internal Medicine

## 2014-09-29 DIAGNOSIS — R928 Other abnormal and inconclusive findings on diagnostic imaging of breast: Secondary | ICD-10-CM

## 2014-09-29 LAB — HM MAMMOGRAPHY

## 2014-10-04 DIAGNOSIS — Z5181 Encounter for therapeutic drug level monitoring: Secondary | ICD-10-CM | POA: Diagnosis not present

## 2014-10-04 DIAGNOSIS — L409 Psoriasis, unspecified: Secondary | ICD-10-CM | POA: Diagnosis not present

## 2014-10-04 DIAGNOSIS — L4050 Arthropathic psoriasis, unspecified: Secondary | ICD-10-CM | POA: Diagnosis not present

## 2014-10-05 ENCOUNTER — Encounter: Payer: Self-pay | Admitting: Internal Medicine

## 2014-10-11 DIAGNOSIS — J449 Chronic obstructive pulmonary disease, unspecified: Secondary | ICD-10-CM | POA: Diagnosis not present

## 2014-10-11 DIAGNOSIS — G4733 Obstructive sleep apnea (adult) (pediatric): Secondary | ICD-10-CM | POA: Diagnosis not present

## 2014-10-11 DIAGNOSIS — T85511A Breakdown (mechanical) of esophageal anti-reflux device, initial encounter: Secondary | ICD-10-CM | POA: Diagnosis not present

## 2014-10-11 DIAGNOSIS — L400 Psoriasis vulgaris: Secondary | ICD-10-CM | POA: Diagnosis not present

## 2014-11-11 DIAGNOSIS — J449 Chronic obstructive pulmonary disease, unspecified: Secondary | ICD-10-CM | POA: Diagnosis not present

## 2014-11-11 DIAGNOSIS — T85511A Breakdown (mechanical) of esophageal anti-reflux device, initial encounter: Secondary | ICD-10-CM | POA: Diagnosis not present

## 2014-11-11 DIAGNOSIS — L400 Psoriasis vulgaris: Secondary | ICD-10-CM | POA: Diagnosis not present

## 2014-11-11 DIAGNOSIS — G4733 Obstructive sleep apnea (adult) (pediatric): Secondary | ICD-10-CM | POA: Diagnosis not present

## 2014-11-18 ENCOUNTER — Other Ambulatory Visit: Payer: Self-pay | Admitting: Internal Medicine

## 2014-11-18 NOTE — Telephone Encounter (Signed)
**Note De-Identified Kentner Obfuscation** Faxed script back to [place]...Johny Chess

## 2014-11-18 NOTE — Telephone Encounter (Signed)
**Note De-Identified Cardiff Obfuscation** Done hardcopy to rachel

## 2014-11-23 ENCOUNTER — Other Ambulatory Visit: Payer: Self-pay | Admitting: Internal Medicine

## 2014-11-23 MED ORDER — VARENICLINE TARTRATE 0.5 MG X 11 & 1 MG X 42 PO MISC: ORAL | Status: DC

## 2014-11-23 NOTE — Progress Notes (Signed)
**Note De-Identified Pecha Obfuscation** Refill request for Chantix starter authorized by C.Young with 5 additional refills.

## 2014-11-26 ENCOUNTER — Encounter: Payer: Self-pay | Admitting: Internal Medicine

## 2014-11-26 ENCOUNTER — Ambulatory Visit (INDEPENDENT_AMBULATORY_CARE_PROVIDER_SITE_OTHER): Payer: Medicare Other | Admitting: Internal Medicine

## 2014-11-26 ENCOUNTER — Other Ambulatory Visit (INDEPENDENT_AMBULATORY_CARE_PROVIDER_SITE_OTHER): Payer: Medicare Other

## 2014-11-26 VITALS — BP 138/80 | HR 108 | Temp 98.0°F | Resp 20 | Ht 64.5 in | Wt 257.1 lb

## 2014-11-26 DIAGNOSIS — E785 Hyperlipidemia, unspecified: Secondary | ICD-10-CM

## 2014-11-26 DIAGNOSIS — R7302 Impaired glucose tolerance (oral): Secondary | ICD-10-CM

## 2014-11-26 DIAGNOSIS — H1013 Acute atopic conjunctivitis, bilateral: Secondary | ICD-10-CM | POA: Diagnosis not present

## 2014-11-26 DIAGNOSIS — Z0000 Encounter for general adult medical examination without abnormal findings: Secondary | ICD-10-CM | POA: Diagnosis not present

## 2014-11-26 DIAGNOSIS — I10 Essential (primary) hypertension: Secondary | ICD-10-CM | POA: Diagnosis not present

## 2014-11-26 LAB — URINALYSIS, ROUTINE W REFLEX MICROSCOPIC
Bilirubin Urine: NEGATIVE
HGB URINE DIPSTICK: NEGATIVE
Ketones, ur: NEGATIVE
Leukocytes, UA: NEGATIVE
Nitrite: NEGATIVE
Total Protein, Urine: 100 — AB
URINE GLUCOSE: NEGATIVE
Urobilinogen, UA: 1 (ref 0.0–1.0)
pH: 6 (ref 5.0–8.0)

## 2014-11-26 LAB — TSH: TSH: 1 u[IU]/mL (ref 0.35–4.50)

## 2014-11-26 LAB — LIPID PANEL
Cholesterol: 184 mg/dL (ref 0–200)
HDL: 32 mg/dL — AB (ref >39.00)
NonHDL: 152
TRIGLYCERIDES: 356 mg/dL — AB (ref 0.0–149.0)
Total CHOL/HDL Ratio: 6
VLDL: 71.2 mg/dL — AB (ref 0.0–40.0)

## 2014-11-26 LAB — BASIC METABOLIC PANEL
BUN: 12 mg/dL (ref 6–23)
CALCIUM: 8.8 mg/dL (ref 8.4–10.5)
CO2: 36 mEq/L — ABNORMAL HIGH (ref 19–32)
Chloride: 99 mEq/L (ref 96–112)
Creatinine, Ser: 0.71 mg/dL (ref 0.40–1.20)
GFR: 89 mL/min (ref >60.00)
Glucose, Bld: 298 mg/dL — ABNORMAL HIGH (ref 70–99)
Potassium: 4.1 mEq/L (ref 3.5–5.1)
Sodium: 137 mEq/L (ref 135–145)

## 2014-11-26 LAB — CBC WITH DIFFERENTIAL/PLATELET
BASOS PCT: 0.5 % (ref 0.0–3.0)
Basophils Absolute: 0 10*3/uL (ref 0.0–0.1)
EOS ABS: 0.2 10*3/uL (ref 0.0–0.7)
EOS PCT: 2.1 % (ref 0.0–5.0)
HEMATOCRIT: 50 % — AB (ref 36.0–46.0)
HEMOGLOBIN: 16.8 g/dL — AB (ref 12.0–15.0)
LYMPHS PCT: 19.5 % (ref 12.0–46.0)
Lymphs Abs: 1.8 10*3/uL (ref 0.7–4.0)
MCHC: 33.5 g/dL (ref 30.0–36.0)
MCV: 96.8 fl (ref 78.0–100.0)
Monocytes Absolute: 0.5 10*3/uL (ref 0.1–1.0)
Monocytes Relative: 5.3 % (ref 3.0–12.0)
NEUTROS ABS: 6.5 10*3/uL (ref 1.4–7.7)
Neutrophils Relative %: 72.6 % (ref 43.0–77.0)
Platelets: 190 10*3/uL (ref 150.0–400.0)
RBC: 5.16 Mil/uL — ABNORMAL HIGH (ref 3.87–5.11)
RDW: 16.5 % — AB (ref 11.5–15.5)
WBC: 9 10*3/uL (ref 4.0–10.5)

## 2014-11-26 LAB — HEPATIC FUNCTION PANEL
ALT: 14 U/L (ref 0–35)
AST: 16 U/L (ref 0–37)
Albumin: 3.6 g/dL (ref 3.5–5.2)
Alkaline Phosphatase: 130 U/L — ABNORMAL HIGH (ref 39–117)
BILIRUBIN DIRECT: 0.1 mg/dL (ref 0.0–0.3)
BILIRUBIN TOTAL: 0.4 mg/dL (ref 0.2–1.2)
Total Protein: 7.6 g/dL (ref 6.0–8.3)

## 2014-11-26 LAB — HEMOGLOBIN A1C: Hgb A1c MFr Bld: 6.8 % — ABNORMAL HIGH (ref 4.6–6.5)

## 2014-11-26 LAB — LDL CHOLESTEROL, DIRECT: LDL DIRECT: 118 mg/dL

## 2014-11-26 MED ORDER — METHYLPREDNISOLONE ACETATE 80 MG/ML IJ SUSP
80.0000 mg | Freq: Once | INTRAMUSCULAR | Status: AC
  Administered 2014-11-26: 80 mg via INTRAMUSCULAR

## 2014-11-26 MED ORDER — MONTELUKAST SODIUM 10 MG PO TABS: 10.0000 mg | ORAL_TABLET | Freq: Every day | ORAL | Status: DC

## 2014-11-26 MED ORDER — ATORVASTATIN CALCIUM 80 MG PO TABS: 80.0000 mg | ORAL_TABLET | Freq: Every day | ORAL | Status: DC

## 2014-11-26 NOTE — Assessment & Plan Note (Signed)
**Note De-identified Hailey Hernandez** Overall doing well, age appropriate education and counseling updated, referrals for preventative services and immunizations addressed, dietary and smoking counseling addressed, most recent labs reviewed.  I have personally reviewed and have noted:  1) the patient's medical and social history 2) The pt's use of alcohol, tobacco, and illicit drugs 3) The patient's current medications and supplements 4) Functional ability including ADL's, fall risk, home safety risk, hearing and visual impairment 5) Diet and physical activities 6) Evidence for depression or mood disorder 7) The patient's height, weight, and BMI have been recorded in the chart  I have made referrals, and provided counseling and education based on review of the above  

## 2014-11-26 NOTE — Assessment & Plan Note (Signed)
**Note De-Identified Fawcett Obfuscation** Ok to incr lipitor to 80 mg, last LDL 124, goal < 100 , better < 70

## 2014-11-26 NOTE — Patient Instructions (Addendum)
**Note De-Identified [place] Obfuscation** You had the steroid shot today  Please take all new medication as prescribed - the singulair  Please continue all other medications as before, and refills have been done if requested.  Please have the pharmacy call with any other refills you may need.  Please continue your efforts at being more active, low cholesterol diet, and weight control.  You are otherwise up to date with prevention measures today.  Please keep your appointments with your specialists as you may have planned  Please go to the LAB in the Basement (turn left off the elevator) for the tests to be done today  You will be contacted by phone if any changes need to be made immediately.  Otherwise, you will receive a letter about your results with an explanation, but please check with MyChart first.  Please remember to sign up for MyChart if you have not done so, as this will be important to you in the future with finding out test results, communicating by private email, and scheduling acute appointments online when needed.  Please return in 6 months, or sooner if needed

## 2014-11-26 NOTE — Progress Notes (Signed)
**Note De-Identified [place] Obfuscation** Subjective:    Patient ID: Hailey Hernandez, female    DOB: 25-Feb-1956, 59 y.o.   MRN: [id number]  HPI  Here for wellness and f/u;  Overall doing ok;  Pt denies CP, worsening SOB, DOE, wheezing, orthopnea, PND, worsening LE edema, palpitations, dizziness or syncope.  Pt denies neurological change such as new headache, facial or extremity weakness.  Pt denies polydipsia, polyuria, or low sugar symptoms. Pt states overall good compliance with treatment and medications, good tolerability, and has been trying to follow lower cholesterol diet.  Pt denies worsening depressive symptoms, suicidal ideation or panic. No fever, night sweats, wt loss, loss of appetite, or other constitutional symptoms.  Pt states good ability with ADL's, has low fall risk, home safety reviewed and adequate, no other significant changes in hearing or vision, and only occasionally active with exercise. [place] home o2. No new compalints. Tolerating statin well . Marland KitchenDoes have several wks ongoing nasal allergy symptoms with clearish congestion, itch and sneezing, without fever, pain, ST, cough, swelling or wheezing., but with congestion and itching to eyes as well. Past Medical History  Diagnosis Date  . Tachyarrhythmia   . Tobacco abuse   . Chronic bronchitis   . History of thyroid cancer   . Rhinitis   . Polymyalgia   . Diverticulitis   . DJD (degenerative joint disease)     spine  . Arthritis   . COPD (chronic obstructive pulmonary disease)   . Hyperlipidemia   . GERD (gastroesophageal reflux disease)   . Fibromyalgia   . Arthritis     bil legs  . Asthma   . Glaucoma   . Allergy   . Anxiety disorder   . Depression   . Cancer   . Complication of anesthesia     20 yrs ago turned blue after surgery  . HTN (hypertension)     no longer on BP medication  . Shortness of breath   . OSA (obstructive sleep apnea)     CPAP  last sleep study 8-10 yr.ag0  . Psoriasis   . Impaired glucose tolerance 05/08/2013  . Atrial  fibrillation    Past Surgical History  Procedure Laterality Date  . Total abdominal hysterectomy    . Cholecystectomy    . Appendectomy    . Carpal tunnel release    . Left shoulder spurs    . Orif right lower leg    . Cholesteatoma excision      right ear  . Carpal tunnel release  10/30/2011    Procedure: CARPAL TUNNEL RELEASE;  Surgeon: Wynonia Sours, MD;  Location: [facility];  Service: Orthopedics;  Laterality: Right;  and mass excision  . Partial throidectomy      reports that she has been smoking Cigarettes.  She has a 45 pack-year smoking history. She has never used smokeless tobacco. She reports that she does not drink alcohol or use illicit drugs. family history includes Allergies in her sister; Alpha-1 antitrypsin deficiency in her sister and sister; Asthma in her sister; Cancer in her brother, mother, and sister; Emphysema in her sister and sister; Heart attack in her father, mother, and sister; Heart disease in her father and mother; Hyperlipidemia in her brother, daughter, and mother; Hypertension in her brother, daughter, mother, sister, and son; Tuberculosis in her other. Allergies  Allergen Reactions  . Doxycycline Other (See Comments)    Drug interaction with Soriatane  . Milnacipran     REACTION: dizzy  . Tramadol **Note De-Identified Zuk Obfuscation** Did not work   Current Outpatient Prescriptions on File Prior to Visit  Medication Sig Dispense Refill  . albuterol (PROVENTIL) (2.5 MG/3ML) 0.083% nebulizer solution Take 3 mLs (2.5 mg total) by nebulization every 4 (four) hours as needed for wheezing or shortness of breath. DX: 496 180 vial prn  . atorvastatin (LIPITOR) 40 MG tablet Take 1 tablet (40 mg total) by mouth daily. 90 tablet 3  . budesonide-formoterol (SYMBICORT) 160-4.5 MCG/ACT inhaler Inhale 2 puffs into the lungs daily.     . clotrimazole (LOTRIMIN) 1 % cream APPLY AS DIRECTED BY YOUR PHYSICIAN. -FOR TOPICAL USE- 30 g 1  . folic acid (FOLVITE) 1 MG tablet Take 1 mg by mouth  daily.     Marland Kitchen LORazepam (ATIVAN) 0.5 MG tablet TAKE 1 TABLET 2 TIMES DAILY AS NEEDED FOR ANXIETY 60 tablet 3  . meloxicam (MOBIC) 7.5 MG tablet Take 1 tablet by mouth daily. At bedtime    . methotrexate (RHEUMATREX) 2.5 MG tablet 25 mg once a week. On wednesday Caution:Chemotherapy. Protect from light.    . Nebulizers (COMPRESSOR/NEBULIZER) MISC Use up to 4 times daily when needed 1 each 0  . omeprazole (PRILOSEC) 20 MG capsule TAKE (1) CAPSULE DAILY 30 capsule 11  . potassium chloride (KLOR-CON 10) 10 MEQ tablet Take 1 tablet (10 mEq total) by mouth 2 (two) times daily. 90 tablet 3  . Secukinumab (COSENTYX) 150 MG/ML SOSY Inject 300 mg into the skin every 30 (thirty) days.     . varenicline (CHANTIX PAK) 0.5 MG X 11 & 1 MG X 42 tablet Take one 0.5 mg tablet by mouth once daily for 3 days, then increase to one 0.5 mg tablet twice daily for 4 days, then increase to one 1 mg tablet twice daily. 53 tablet 5  . [DISCONTINUED] doxepin (SINEQUAN) 10 MG capsule 1-2 at bedtime     . [DISCONTINUED] fexofenadine (ALLEGRA) 180 MG tablet Take 180 mg by mouth daily.       No current facility-administered medications on file prior to visit.   Review of Systems Constitutional: Negative for increased diaphoresis, other activity, appetite or other siginficant weight change  HENT: Negative for worsening hearing loss, ear pain, facial swelling, mouth sores and neck stiffness.   Eyes: Negative for other worsening pain, redness or visual disturbance.  Respiratory: Negative for shortness of breath and wheezing.   Cardiovascular: Negative for chest pain and palpitations.  Gastrointestinal: Negative for diarrhea, blood in stool, abdominal distention or other pain Genitourinary: Negative for hematuria, flank pain or change in urine volume.  Musculoskeletal: Negative for myalgias or other joint complaints.  Skin: Negative for color change and wound.  Neurological: Negative for syncope and numbness. other than  noted Hematological: Negative for adenopathy. or other swelling Psychiatric/Behavioral: Negative for hallucinations, self-injury, decreased concentration or other worsening agitation.      Objective:   Physical Exam BP 138/80 mmHg  Pulse 108  Temp(Src) 98 F (36.7 C) (Oral)  Resp 20  Ht 5' 4.5" (1.638 m)  Wt 257 lb 1.3 oz (116.611 kg)  BMI 43.46 kg/m2  SpO2 95% VS noted, not ill appaering Constitutional: Pt is oriented to person, place, and time. Appears well-developed and well-nourished.  Head: Normocephalic and atraumatic.  Right Ear: External ear normal.  Left Ear: External ear normal.  Nose: Nose normal.  Mouth/Throat: Oropharynx is clear and moist.  Eyes: Conjunctivae with bilat erythema/puffiness nontendeand EOM are normal. Pupils are equal, round, and reactive to light.  Neck: Normal range of **Note De-Identified Yaney Obfuscation** motion. Neck supple. No JVD present. No tracheal deviation present.  Cardiovascular: Normal rate, regular rhythm, normal heart sounds and intact distal pulses.   Pulmonary/Chest: Effort normal and breath sounds decreased without rales or wheezing  Abdominal: Soft. Bowel sounds are normal. NT. No HSM  Musculoskeletal: Normal range of motion. Exhibits no edema.  Lymphadenopathy:  Has no cervical adenopathy.  Neurological: Pt is alert and oriented to person, place, and time. Pt has normal reflexes. No cranial nerve deficit. Motor grossly intact Skin: Skin is warm and dry. No rash noted.  Psychiatric:  Has normal mood and affect. Behavior is normal.     Assessment & Plan:

## 2014-11-26 NOTE — Assessment & Plan Note (Signed)
**Note De-Identified Balderson Obfuscation** Mild to mod, for depomedrol IM, add singular, cont zytec/flonase,  to f/u any worsening symptoms or concerns

## 2014-11-26 NOTE — Progress Notes (Signed)
**Note De-identified Pethtel Obfuscation** Pre visit review using our clinic review tool, if applicable. No additional management support is needed unless otherwise documented below in the visit note. 

## 2014-12-03 ENCOUNTER — Ambulatory Visit (INDEPENDENT_AMBULATORY_CARE_PROVIDER_SITE_OTHER)
Admission: RE | Admit: 2014-12-03 | Discharge: 2014-12-03 | Disposition: A | Discharge status: Home / Self Care | Payer: Medicare Other | Source: Ambulatory Visit | Attending: Internal Medicine | Admitting: Internal Medicine

## 2014-12-03 ENCOUNTER — Encounter: Payer: Self-pay | Admitting: Internal Medicine

## 2014-12-03 ENCOUNTER — Ambulatory Visit (INDEPENDENT_AMBULATORY_CARE_PROVIDER_SITE_OTHER): Payer: Medicare Other | Admitting: Internal Medicine

## 2014-12-03 VITALS — BP 134/70 | HR 110 | Ht 64.0 in | Wt 255.4 lb

## 2014-12-03 DIAGNOSIS — J449 Chronic obstructive pulmonary disease, unspecified: Secondary | ICD-10-CM

## 2014-12-03 DIAGNOSIS — F17200 Nicotine dependence, unspecified, uncomplicated: Secondary | ICD-10-CM

## 2014-12-03 DIAGNOSIS — J309 Allergic rhinitis, unspecified: Secondary | ICD-10-CM | POA: Insufficient documentation

## 2014-12-03 DIAGNOSIS — Z720 Tobacco use: Secondary | ICD-10-CM | POA: Diagnosis not present

## 2014-12-03 DIAGNOSIS — G4733 Obstructive sleep apnea (adult) (pediatric): Secondary | ICD-10-CM | POA: Diagnosis not present

## 2014-12-03 DIAGNOSIS — J310 Chronic rhinitis: Secondary | ICD-10-CM

## 2014-12-03 MED ORDER — AZELASTINE-FLUTICASONE 137-50 MCG/ACT NA SUSP
2.0000 | Freq: Every day | NASAL | Status: DC
Start: 2014-12-03 — End: 2014-12-23

## 2014-12-03 MED ORDER — METHYLPREDNISOLONE ACETATE 80 MG/ML IJ SUSP
80.0000 mg | Freq: Once | INTRAMUSCULAR | Status: AC
  Administered 2014-12-03: 80 mg via INTRAMUSCULAR

## 2014-12-03 NOTE — Assessment & Plan Note (Signed)
**Note De-Identified Suk Obfuscation** Counseling reinforced

## 2014-12-03 NOTE — Progress Notes (Signed)
**Note De-Identified Carnathan Obfuscation** Patient ID: Sanjana Folz Schaper, female    DOB: 01-13-1956, 59 y.o.   MRN: [id number]  HPI 34 female with known hx of chronic bronchitis , OSA/OHS , active smoker   02/05/11- 55 yoFwith chronic bronchitis, tobacco use, OSA, obesity hypoventilation syndrome. Last here December 15, 2010. Since then has continued Symbicort. Comes with sister today. Sleeping 2 nights ago- woke smothered. Had to pull off CPAP. She gradually cleared over several minutes. Not clear if she refluxed, had mucus plug or ??. Today is typical with no residual. Chest congestion varies day to day. Says cough better, not coughing up anything purulent or bloody. Still averaging 1.5 PPD, down from 3 PPD.  Compliant with CPAP- needs new mask.   07/12/2011 Acute OV  Presents for an acute office visit. Complains pain in right side x 3 days--more painful with cough and movement.  cough with green sputum--. She has been sick for 2 weeks, cough, congestion , drainage. Was exposed to URI symptoms at hospital . Started with lots of drippy nose and drainge.  No fever. Initially phoned in a zpack with no improvement. Then started on Omnicef 4 days ago.  Does have some wheezing . Now pain with inspiration esp along right lateral ribs. Tender to touch and with movement. No rash or fever.   08/10/11- 59 yoFwith chronic bronchitis, tobacco use, OSA, obesity hypoventilation syndrome. Several family members here.Marland Kitchen PCP Dr Edrick Oh She describes persistent ear ache on the right, nasal discharge from the left with little bit of bloody mucus. Distal coughing productively with light yellow sputum, no blood. Questions low-grade fever mainly because she sometimes feels chilly. Smoking is down from 3 packs a day to a little under one pack per day. I encouraged her to keep working at. She gives history of a cholesteatoma and told never to put water in her right ear.  12/10/11- 59 yoFwith chronic bronchitis, tobacco use, OSA, obesity hypoventilation syndrome.  Sister  here. Her sister [name] died in her sleep of end-stage COPD complicated by alpha-1 antitrypsin deficiency. We had called in Augmentin which did help some. Complains now of sinus pressure, blowing bloody scabs from her nose, postnasal drip, cough productive of thick mucus. Denies fever, swollen glands, headache. She has decided that she finally wants to quit smoking. Has reduced from 3 packs per day habit to one pack per day. We discussed support, medical reasons and available techniques and answered her questions.  05/15/12-59 yoFwith chronic bronchitis, tobacco use, OSA, obesity hypoventilation syndrome.  Sister here.  Pt states breathing has been "okay" since last seen. Pt c/o increased SOB and allergy trouble with HA . Pt denies prod cough, wheezing and chest tightness.. unhappy with [name] and wants to change to [name]. CPAP 13/ O2 2L/ [facility] Parking requested  COPD assessment test (CAT) score 14/40 CXR 07/12/11- IMPRESSION:  Bronchitic changes. Left base atelectasis.  Original Report Authenticated By: Raelyn Number, M.D.   09/15/12- 59 yoFwith chronic bronchitis, tobacco use, OSA, obesity hypoventilation syndrome.  Sister here. Follows for: pt states last couple of weeks increase sob,productive cough.chest tightness.denies any wheezing.  For 2 weeks her eyes had been itching and she has had frontal headache, watery rhinorrhea and a little bit of epistaxis. Scratchy throat. Questions low-grade fever. Treating with antihistamines. She continues oxygen 2-3 L per minute/[facility]. Continues good compliance with CPAP 13+ oxygen 2 L at night. She is making no effort to stop smoking despite our counseling. CXR 05/15/12-reviewed IMPRESSION:  Stable **Note De-Identified Nosbisch Obfuscation** bronchitic changes and interstitial prominence.  Lingular scarring or atelectasis.  Borderline cardiomegaly.  Original Report Authenticated By: Raelyn Number, M.D.   01/30/13-  59 yoFwith chronic bronchitis, tobacco use, OSA,  obesity hypoventilation syndrome.  Sister here. FOLLOWS FOR: states she is having increased SOB Home oxygen [facility] using 3 L at home, 2 L portable. CPAP 13+ oxygen 2 L for sleep Expresses interest in quitting smoking and asks for Chantix which we discussed. Productive cough with some chest congestion, white or yellow sputum. No blood, no chest pain. New problem-3 weeks, aching right upper tibia anteriorly keeps her awake at hurts to bear weight. CXR 11/07/12 IMPRESSION:  Stable chest x-ray with cardiomegaly. No active lung disease.  Original Report Authenticated By: Ivar Drape, M.D.  06/05/13- 59 yoFwith chronic bronchitis, tobacco use, OSA, obesity hypoventilation syndrome.  Sister here. FOLLOWS FOR: Breathing unchanged since last OV.  Needs to discuss oxygen today Home oxygen  [facility] using 3 L at home, 2 L portable. CPAP 13+ oxygen 2 L for sleep Anxious about trying Chantix, but decided to try it- discussed. Failed Wellbutrin.  10/09/13- 59 yoFwith chronic bronchitis, tobacco use (1 ppd/ 45 pk yrs), OSA, obesity hypoventilation syndrome.  Sister here. FOLLOWS FOR:  Still having sob, cough (non-productive) and wheezing.  Also, headaches and facial swelling, painfull  to the touch. CPAP 13+ oxygen 2 L for sleep/ [facility] She looked into a portable concentrator but home care can't provide it with her insurance. Upper respiratory infection/sinusitis over Christmas, treated with Keflex which helped but she still has nasal congestion and blows small amounts of blood from her nose without headache or fever.  02/05/14-  59 yoF smoker with chronic bronchitis, tobacco use (1 ppd/ 45 pk yrs), OSA, obesity hypoventilation syndrome. Complicated by psoriasis             Grand daughter here. FOLLOWS FOR:  Reports breathing unchanged since last OV.  Wearing CPAP 13 + O2 2L/ [facility]  Daily cough productive white sputum. Nothing bloody or purulent. She continues to smoke and gave  up on Chantix  07/23/14- 58 yoF smoker with chronic bronchitis/ COPD, tobacco use (1 ppd/ 45 pk yrs), OSA, obesity hypoventilation syndrome. Complicated by psoriasis             Grand daughter here. FOLLOW FOR: COPD;  CXR 02/05/14 IMPRESSION:  No segmental infiltrate or pulmonary edema. Slight worsening central  bronchitic changes. Stable chronic interstitial prominence.  Electronically Signed  By: Lahoma Crocker M.D.  On: 02/05/2014 13:31  07/23/14- 21 yoF smoker with chronic bronchitis/ COPD, tobacco use (1 ppd/ 45 pk yrs), OSA, obesity hypoventilation syndrome. Complicated by psoriasis             Sister here. FOLLOW FOR: COPD; SOB; wheezing Nasal drainage, cough prod white.  On methotrexate and Cosentyx for psoriasis Avoids lasix- makes gout flare up. Admits fluid retention. Continues CPAP 13/ O2 2L. Wants to change back to [place] from [name] for better service.  12/03/14- 14 yoF smoker with chronic bronchitis/ COPD, tobacco use (1 ppd/ 45 pk yrs), OSA, obesity hypoventilation syndrome. Complicated by psoriasis             Sister here FOLLOWS LNL:[id number] to wear  CPAP 13/O2 2L/ [facility].  Has not been able to stop smoking and I don't think she is really trying despite our counseling efforts. Says her breathing is "okay". Blames allergy for burning and itching of eyes and nose over the last 2 months. Her **Note De-Identified Reist Obfuscation** primary physician gave Singulair and Depo-Medrol 1 week ago. She is using Flonase. She questions whether symptoms actually are reaction to her psoriasis medication Cosentyx. Review of Epocrates does suggest this may be the case.   Review of Systems-see HPI Constitutional:   No-   weight loss, night sweats, fevers, chills, fatigue, lassitude. HEENT:    headaches, difficulty swallowing, tooth/dental problems, sore throat,       + sneezing,+ itching,  ear ache,  +nasal congestion, +post nasal drip,  CV:  No-   chest pain, orthopnea, PND, +swelling in lower extremities, anasarca,    dizziness, palpitations Resp: +  shortness of breath with exertion or at rest.              + productive cough,  + non-productive cough,  No- coughing up of blood.              No-   change in color of mucus.  + wheezing.   Skin: psoriasis GI:  No-   heartburn, indigestion, abdominal pain, nausea, vomiting,  GU: No-    MS:  + joint pain or swelling. . Neuro-     nothing unusual Psych:  No- change in mood or affect. No depression or anxiety.  No memory loss.   Objective:   Physical Exam General- Alert, Oriented, Affect-appropriate, Distress- none acute, obese. On 3L O2 sat 93% Skin- +extensive psoriasis,  Lymphadenopathy- none Head- atraumatic            Eyes- Gross vision intact, PERRLA, conjunctivae clear secretions            Ears- Hearing, canals- cerumen/ debris R            Nose- + turbinate edema, no-Septal dev, mucus, polyps, erosion, perforation             Throat- Mallampati III , mucosa clear , drainage- none, tonsils- atrophic.                        +dentures Neck- flexible , trachea midline, no stridor , thyroid nl, carotid no bruit Chest - symmetrical excursion , unlabored           Heart/CV- RRR , no murmur , no gallop  , no rub, nl s1 s2                           - JVD- none , edema- none, stasis dermatitis changes+ bilateral, varices- none           Lung- loose rhonchi +, dullness-none, rub- none, + shallow tachypnea           Chest wall-  Abd-  Br/ Gen/ Rectal- Not done, not indicated Extrem- +apparent lipoma right medial knee with stasis changes Neuro- grossly intact to observation

## 2014-12-03 NOTE — Assessment & Plan Note (Addendum)
**Note De-Identified Mckissic Obfuscation** Past 2 months would be an unlikely time to be experiencing significant problem with atopic rhinitis unless there is a mold or dust problem in the home. More likely this is a rhinitis and conjunctivitis reaction to her Cosentyx. She will discuss this with the doctor managing her psoriasis. Plan-Depo-Medrol, sample Dymista nasal spray for trial instead of Flonase

## 2014-12-03 NOTE — Assessment & Plan Note (Addendum)
**Note De-Identified Treichler Obfuscation** Ongoing tobacco use, not have a good outcome. This has been reinforced with her. Medication control is good. She remains oxygen dependent. Plan chest x-ray

## 2014-12-03 NOTE — Assessment & Plan Note (Signed)
**Note De-Identified Mcphee Obfuscation** Good compliance and control are described. CPAP remains medically necessary.

## 2014-12-03 NOTE — Assessment & Plan Note (Signed)
**Note De-Identified Billiot Obfuscation** Asympt, for a1c, pt to call for cbg > 200 or onset polys with steroid tx

## 2014-12-03 NOTE — Patient Instructions (Signed)
**Note De-Identified Domine Obfuscation** Depo 80  Sample Dymista nasal spray  1-2 puffs reach nostril once daily at bedtime. Try this instead of Flonase.  Continue CPAP 13/ O2 2L  [facility]  Order CXR   Dx COPD with chronic bronchitis

## 2014-12-10 DIAGNOSIS — J449 Chronic obstructive pulmonary disease, unspecified: Secondary | ICD-10-CM | POA: Diagnosis not present

## 2014-12-10 DIAGNOSIS — G4733 Obstructive sleep apnea (adult) (pediatric): Secondary | ICD-10-CM | POA: Diagnosis not present

## 2014-12-10 DIAGNOSIS — L400 Psoriasis vulgaris: Secondary | ICD-10-CM | POA: Diagnosis not present

## 2014-12-10 DIAGNOSIS — T85511A Breakdown (mechanical) of esophageal anti-reflux device, initial encounter: Secondary | ICD-10-CM | POA: Diagnosis not present

## 2014-12-21 ENCOUNTER — Encounter: Payer: Self-pay | Admitting: Internal Medicine

## 2014-12-22 ENCOUNTER — Encounter: Payer: Self-pay | Admitting: Internal Medicine

## 2014-12-23 MED ORDER — AZELASTINE-FLUTICASONE 137-50 MCG/ACT NA SUSP: 2.0000 | Freq: Every day | NASAL | Status: DC

## 2015-01-03 DIAGNOSIS — B3749 Other urogenital candidiasis: Secondary | ICD-10-CM | POA: Diagnosis not present

## 2015-01-03 DIAGNOSIS — Z79899 Other long term (current) drug therapy: Secondary | ICD-10-CM | POA: Diagnosis not present

## 2015-01-03 DIAGNOSIS — L409 Psoriasis, unspecified: Secondary | ICD-10-CM | POA: Diagnosis not present

## 2015-01-03 DIAGNOSIS — L4050 Arthropathic psoriasis, unspecified: Secondary | ICD-10-CM | POA: Diagnosis not present

## 2015-01-10 DIAGNOSIS — G4733 Obstructive sleep apnea (adult) (pediatric): Secondary | ICD-10-CM | POA: Diagnosis not present

## 2015-01-10 DIAGNOSIS — L400 Psoriasis vulgaris: Secondary | ICD-10-CM | POA: Diagnosis not present

## 2015-01-10 DIAGNOSIS — J449 Chronic obstructive pulmonary disease, unspecified: Secondary | ICD-10-CM | POA: Diagnosis not present

## 2015-01-10 DIAGNOSIS — T85511A Breakdown (mechanical) of esophageal anti-reflux device, initial encounter: Secondary | ICD-10-CM | POA: Diagnosis not present

## 2015-01-13 ENCOUNTER — Other Ambulatory Visit: Payer: Self-pay | Admitting: Internal Medicine

## 2015-02-01 NOTE — Patient Instructions (Signed)
**Note De-identified Mcglinn Obfuscation** Error

## 2015-02-09 DIAGNOSIS — J449 Chronic obstructive pulmonary disease, unspecified: Secondary | ICD-10-CM | POA: Diagnosis not present

## 2015-02-09 DIAGNOSIS — T85511A Breakdown (mechanical) of esophageal anti-reflux device, initial encounter: Secondary | ICD-10-CM | POA: Diagnosis not present

## 2015-02-09 DIAGNOSIS — G4733 Obstructive sleep apnea (adult) (pediatric): Secondary | ICD-10-CM | POA: Diagnosis not present

## 2015-02-09 DIAGNOSIS — L400 Psoriasis vulgaris: Secondary | ICD-10-CM | POA: Diagnosis not present

## 2015-02-12 ENCOUNTER — Other Ambulatory Visit: Payer: Self-pay | Admitting: Internal Medicine

## 2015-02-16 DIAGNOSIS — G4733 Obstructive sleep apnea (adult) (pediatric): Secondary | ICD-10-CM | POA: Diagnosis not present

## 2015-03-08 ENCOUNTER — Telehealth: Payer: Self-pay

## 2015-03-09 NOTE — Telephone Encounter (Signed)
**Note De-Identified Plotkin Obfuscation** Dr Jenny Reichmann did Preventive exam, so since it entails much of the AWV; I do no repeat. Thanks

## 2015-03-12 DIAGNOSIS — G4733 Obstructive sleep apnea (adult) (pediatric): Secondary | ICD-10-CM | POA: Diagnosis not present

## 2015-03-12 DIAGNOSIS — L400 Psoriasis vulgaris: Secondary | ICD-10-CM | POA: Diagnosis not present

## 2015-03-12 DIAGNOSIS — T85511A Breakdown (mechanical) of esophageal anti-reflux device, initial encounter: Secondary | ICD-10-CM | POA: Diagnosis not present

## 2015-03-12 DIAGNOSIS — J449 Chronic obstructive pulmonary disease, unspecified: Secondary | ICD-10-CM | POA: Diagnosis not present

## 2015-03-28 ENCOUNTER — Other Ambulatory Visit: Payer: Self-pay | Admitting: Internal Medicine

## 2015-04-11 DIAGNOSIS — L400 Psoriasis vulgaris: Secondary | ICD-10-CM | POA: Diagnosis not present

## 2015-04-11 DIAGNOSIS — T85511A Breakdown (mechanical) of esophageal anti-reflux device, initial encounter: Secondary | ICD-10-CM | POA: Diagnosis not present

## 2015-04-11 DIAGNOSIS — G4733 Obstructive sleep apnea (adult) (pediatric): Secondary | ICD-10-CM | POA: Diagnosis not present

## 2015-04-11 DIAGNOSIS — J449 Chronic obstructive pulmonary disease, unspecified: Secondary | ICD-10-CM | POA: Diagnosis not present

## 2015-04-18 ENCOUNTER — Encounter: Payer: Self-pay | Admitting: Internal Medicine

## 2015-04-19 MED ORDER — BUDESONIDE-FORMOTEROL FUMARATE 160-4.5 MCG/ACT IN AERO: 2.0000 | INHALATION_SPRAY | Freq: Two times a day (BID) | RESPIRATORY_TRACT | Status: DC

## 2015-04-21 NOTE — Progress Notes (Signed)
**Note De-Identified Armbrister Obfuscation** This encounter was for walking on oxygen only, needs to be closed out.

## 2015-05-09 DIAGNOSIS — L409 Psoriasis, unspecified: Secondary | ICD-10-CM | POA: Diagnosis not present

## 2015-05-09 DIAGNOSIS — Z5181 Encounter for therapeutic drug level monitoring: Secondary | ICD-10-CM | POA: Diagnosis not present

## 2015-05-09 DIAGNOSIS — J449 Chronic obstructive pulmonary disease, unspecified: Secondary | ICD-10-CM | POA: Diagnosis not present

## 2015-05-12 DIAGNOSIS — J449 Chronic obstructive pulmonary disease, unspecified: Secondary | ICD-10-CM | POA: Diagnosis not present

## 2015-05-12 DIAGNOSIS — L400 Psoriasis vulgaris: Secondary | ICD-10-CM | POA: Diagnosis not present

## 2015-05-12 DIAGNOSIS — G4733 Obstructive sleep apnea (adult) (pediatric): Secondary | ICD-10-CM | POA: Diagnosis not present

## 2015-05-12 DIAGNOSIS — T85511A Breakdown (mechanical) of esophageal anti-reflux device, initial encounter: Secondary | ICD-10-CM | POA: Diagnosis not present

## 2015-05-27 ENCOUNTER — Encounter: Payer: Self-pay | Admitting: Internal Medicine

## 2015-05-27 ENCOUNTER — Ambulatory Visit (INDEPENDENT_AMBULATORY_CARE_PROVIDER_SITE_OTHER): Payer: Medicare Other | Admitting: Internal Medicine

## 2015-05-27 ENCOUNTER — Other Ambulatory Visit (INDEPENDENT_AMBULATORY_CARE_PROVIDER_SITE_OTHER): Payer: Medicare Other

## 2015-05-27 ENCOUNTER — Other Ambulatory Visit: Payer: Self-pay | Admitting: Internal Medicine

## 2015-05-27 VITALS — BP 160/88 | HR 106 | Temp 98.1°F | Ht 64.0 in | Wt 259.0 lb

## 2015-05-27 DIAGNOSIS — Z0000 Encounter for general adult medical examination without abnormal findings: Secondary | ICD-10-CM

## 2015-05-27 DIAGNOSIS — J019 Acute sinusitis, unspecified: Secondary | ICD-10-CM

## 2015-05-27 DIAGNOSIS — J310 Chronic rhinitis: Secondary | ICD-10-CM

## 2015-05-27 DIAGNOSIS — D751 Secondary polycythemia: Secondary | ICD-10-CM

## 2015-05-27 DIAGNOSIS — R7302 Impaired glucose tolerance (oral): Secondary | ICD-10-CM

## 2015-05-27 DIAGNOSIS — I10 Essential (primary) hypertension: Secondary | ICD-10-CM

## 2015-05-27 DIAGNOSIS — Z0189 Encounter for other specified special examinations: Secondary | ICD-10-CM

## 2015-05-27 LAB — HEPATIC FUNCTION PANEL
ALK PHOS: 142 U/L — AB (ref 39–117)
ALT: 14 U/L (ref 0–35)
AST: 14 U/L (ref 0–37)
Albumin: 3.9 g/dL (ref 3.5–5.2)
BILIRUBIN DIRECT: 0.1 mg/dL (ref 0.0–0.3)
Total Bilirubin: 0.5 mg/dL (ref 0.2–1.2)
Total Protein: 7.8 g/dL (ref 6.0–8.3)

## 2015-05-27 LAB — CBC WITH DIFFERENTIAL/PLATELET
Basophils Absolute: 0 10*3/uL (ref 0.0–0.1)
Basophils Relative: 0.3 % (ref 0.0–3.0)
EOS PCT: 2.4 % (ref 0.0–5.0)
Eosinophils Absolute: 0.3 10*3/uL (ref 0.0–0.7)
HEMATOCRIT: 52.6 % — AB (ref 36.0–46.0)
Hemoglobin: 17.5 g/dL — ABNORMAL HIGH (ref 12.0–15.0)
LYMPHS ABS: 2.1 10*3/uL (ref 0.7–4.0)
Lymphocytes Relative: 18.9 % (ref 12.0–46.0)
MCHC: 33.2 g/dL (ref 30.0–36.0)
MCV: 101 fl — AB (ref 78.0–100.0)
Monocytes Absolute: 0.5 10*3/uL (ref 0.1–1.0)
Monocytes Relative: 4.8 % (ref 3.0–12.0)
NEUTROS PCT: 73.6 % (ref 43.0–77.0)
Neutro Abs: 8 10*3/uL — ABNORMAL HIGH (ref 1.4–7.7)
Platelets: 175 10*3/uL (ref 150.0–400.0)
RBC: 5.21 Mil/uL — AB (ref 3.87–5.11)
RDW: 16.1 % — ABNORMAL HIGH (ref 11.5–15.5)
WBC: 10.9 10*3/uL — ABNORMAL HIGH (ref 4.0–10.5)

## 2015-05-27 LAB — LDL CHOLESTEROL, DIRECT: Direct LDL: 128 mg/dL

## 2015-05-27 LAB — LIPID PANEL
Cholesterol: 192 mg/dL (ref 0–200)
HDL: 36.3 mg/dL — AB (ref >39.00)
NONHDL: 155.53
Total CHOL/HDL Ratio: 5
Triglycerides: 256 mg/dL — ABNORMAL HIGH (ref 0.0–149.0)
VLDL: 51.2 mg/dL — AB (ref 0.0–40.0)

## 2015-05-27 LAB — BASIC METABOLIC PANEL
BUN: 9 mg/dL (ref 6–23)
CALCIUM: 9.7 mg/dL (ref 8.4–10.5)
CO2: 36 mEq/L — ABNORMAL HIGH (ref 19–32)
Chloride: 97 mEq/L (ref 96–112)
Creatinine, Ser: 0.62 mg/dL (ref 0.40–1.20)
GFR: 103.9 mL/min (ref >60.00)
Glucose, Bld: 138 mg/dL — ABNORMAL HIGH (ref 70–99)
Potassium: 4.2 mEq/L (ref 3.5–5.1)
Sodium: 141 mEq/L (ref 135–145)

## 2015-05-27 LAB — HEMOGLOBIN A1C: Hgb A1c MFr Bld: 5.9 % (ref 4.6–6.5)

## 2015-05-27 MED ORDER — METHYLPREDNISOLONE ACETATE 80 MG/ML IJ SUSP
80.0000 mg | Freq: Once | INTRAMUSCULAR | Status: AC
Start: 2015-05-27 — End: 2015-05-27
  Administered 2015-05-27: 80 mg via INTRAMUSCULAR

## 2015-05-27 MED ORDER — PREDNISONE 10 MG PO TABS: ORAL_TABLET | ORAL | Status: DC

## 2015-05-27 MED ORDER — AZITHROMYCIN 250 MG PO TABS: ORAL_TABLET | ORAL | Status: DC

## 2015-05-27 NOTE — Assessment & Plan Note (Signed)
**Note De-Identified Mcgrail Obfuscation** stable overall by history and exam, recent data reviewed with pt, and pt to continue medical treatment as before,  to f/u any worsening symptoms or concerns  Lab Results  Component Value Date   HGBA1C 6.8* 11/26/2014   Pt to call for cbg > 200 with steroid tx, cont same tx for now, f.u lab next visit

## 2015-05-27 NOTE — Assessment & Plan Note (Signed)
**Note De-Identified Lopes Obfuscation** Has responded to steroid in past with marked worssening last 2 wks  - for depomedrol IM, predpac asd, cont all other current tx,  to f/u any worsening symptoms or concerns

## 2015-05-27 NOTE — Patient Instructions (Signed)
**Note De-Identified Neubecker Obfuscation** You had the steroid shot today  Please take all new medication as prescribed - the antibiotic, and prednisone  Please continue all other medications as before, and refills have been done if requested.  Please have the pharmacy call with any other refills you may need.  Please continue your efforts at being more active, low cholesterol diet, and weight control.  Please keep your appointments with your specialists as you may have planned  Please go to the LAB in the Basement (turn left off the elevator) for the tests to be done today  You will be contacted by phone if any changes need to be made immediately.  Otherwise, you will receive a letter about your results with an explanation, but please check with MyChart first.  Please remember to sign up for MyChart if you have not done so, as this will be important to you in the future with finding out test results, communicating by private email, and scheduling acute appointments online when needed.  Please return in 6 months, or sooner if needed, with Lab testing done 3-5 days before

## 2015-05-27 NOTE — Assessment & Plan Note (Signed)
**Note De-Identified Dejaynes Obfuscation** Mild elev today likley reactive, o/w stable overall by history and exam, recent data reviewed with pt, and pt to continue medical treatment as before,  to f/u any worsening symptoms or concerns BP Readings from Last 3 Encounters:  05/27/15 160/88  12/03/14 134/70  11/26/14 138/80

## 2015-05-27 NOTE — Addendum Note (Signed)
**Note De-Identified Donathan Obfuscation** Addended by: Lyman Bishop on: 05/27/2015 11:49 AM   Modules accepted: Orders

## 2015-05-27 NOTE — Assessment & Plan Note (Signed)
**Note De-identified Gago Obfuscation** Mild to mod, for antibx course,  to f/u any worsening symptoms or concerns 

## 2015-05-27 NOTE — Progress Notes (Signed)
**Note De-Identified Mikelson Obfuscation** Subjective:    Patient ID: Miguel Dibble Tamburri, female    DOB: 08/04/1956, 59 y.o.   MRN: [id number]  HPI   Here with 2-3 days acute onset fever, facial pain, pressure, headache, general weakness and malaise, and greenish d/c, with mild ST and cough, but pt denies chest pain, wheezing, increased sob or doe, orthopnea, PND, increased LE swelling, palpitations, dizziness or syncope.  Does have several wks ongoing nasal allergy symptoms with clearish congestion, itch and sneezing, without fever, pain, ST, cough, swelling or wheezing.  Pt denies new neurological symptoms such as new headache, or facial or extremity weakness or numbness   Pt denies polydipsia, polyuria, or low sugar symptoms such as weakness or confusion improved with po intake.  Pt states overall good compliance with meds, trying to follow lower cholesterol, diabetic diet, wt overall stable but little exercise however.    Wt Readings from Last 3 Encounters:  05/27/15 259 lb (117.482 kg)  12/03/14 255 lb 6.4 oz (115.849 kg)  11/26/14 257 lb 1.3 oz (116.611 kg)   BP Readings from Last 3 Encounters:  05/27/15 160/88  12/03/14 134/70  11/26/14 138/80   Past Medical History  Diagnosis Date  . Tachyarrhythmia   . Tobacco abuse   . Chronic bronchitis   . History of thyroid cancer   . Rhinitis   . Polymyalgia   . Diverticulitis   . DJD (degenerative joint disease)     spine  . Arthritis   . COPD (chronic obstructive pulmonary disease)   . Hyperlipidemia   . GERD (gastroesophageal reflux disease)   . Fibromyalgia   . Arthritis     bil legs  . Asthma   . Glaucoma   . Allergy   . Anxiety disorder   . Depression   . Cancer   . Complication of anesthesia     20 yrs ago turned blue after surgery  . HTN (hypertension)     no longer on BP medication  . Shortness of breath   . OSA (obstructive sleep apnea)     CPAP  last sleep study 8-10 yr.ag0  . Psoriasis   . Impaired glucose tolerance 05/08/2013  . Atrial fibrillation     Past Surgical History  Procedure Laterality Date  . Total abdominal hysterectomy    . Cholecystectomy    . Appendectomy    . Carpal tunnel release    . Left shoulder spurs    . Orif right lower leg    . Cholesteatoma excision      right ear  . Carpal tunnel release  10/30/2011    Procedure: CARPAL TUNNEL RELEASE;  Surgeon: Wynonia Sours, MD;  Location: [facility];  Service: Orthopedics;  Laterality: Right;  and mass excision  . Partial throidectomy      reports that she has been smoking Cigarettes.  She has a 45 pack-year smoking history. She has never used smokeless tobacco. She reports that she does not drink alcohol or use illicit drugs. family history includes Allergies in her sister; Alpha-1 antitrypsin deficiency in her sister and sister; Asthma in her sister; Cancer in her brother, mother, and sister; Emphysema in her sister and sister; Heart attack in her father, mother, and sister; Heart disease in her father and mother; Hyperlipidemia in her brother, daughter, and mother; Hypertension in her brother, daughter, mother, sister, and son; Tuberculosis in her other. Allergies  Allergen Reactions  . Doxycycline Other (See Comments)    Drug interaction with Soriatane  . **Note De-Identified Gruhn Obfuscation** Milnacipran     REACTION: dizzy  . Tramadol     Did not work   Current Outpatient Prescriptions on File Prior to Visit  Medication Sig Dispense Refill  . albuterol (PROVENTIL) (2.5 MG/3ML) 0.083% nebulizer solution Take 3 mLs (2.5 mg total) by nebulization every 4 (four) hours as needed for wheezing or shortness of breath. DX: 496 180 vial prn  . atorvastatin (LIPITOR) 80 MG tablet Take 1 tablet (80 mg total) by mouth daily. 90 tablet 3  . Azelastine-Fluticasone (DYMISTA) 137-50 MCG/ACT SUSP Place 2 sprays into both nostrils at bedtime. 1 Bottle 5  . budesonide-formoterol (SYMBICORT) 160-4.5 MCG/ACT inhaler Inhale 2 puffs into the lungs 2 (two) times daily. 1 Inhaler 2  . clotrimazole (LOTRIMIN) 1 %  cream APPLY AS DIRECTED BY YOUR PHYSICIAN. -FOR TOPICAL USE- 30 g 0  . folic acid (FOLVITE) 1 MG tablet Take 1 mg by mouth daily.     . furosemide (LASIX) 40 MG tablet TAKE 1 TABLET DAILY 90 tablet 3  . gabapentin (NEURONTIN) 300 MG capsule TAKE 1 TO 2 CAPSULES AT NIGHT AS NEEDED FOR PAIN 180 capsule 0  . LORazepam (ATIVAN) 0.5 MG tablet TAKE 1 TABLET 2 TIMES DAILY AS NEEDED FOR ANXIETY 60 tablet 3  . methotrexate (RHEUMATREX) 2.5 MG tablet 25 mg once a week. On wednesday Caution:Chemotherapy. Protect from light.    . montelukast (SINGULAIR) 10 MG tablet Take 1 tablet (10 mg total) by mouth daily. 90 tablet 3  . Nebulizers (COMPRESSOR/NEBULIZER) MISC Use up to 4 times daily when needed 1 each 0  . omeprazole (PRILOSEC) 20 MG capsule TAKE (1) CAPSULE DAILY 30 capsule 11  . potassium chloride (K-DUR) 10 MEQ tablet TAKE  (1)  TABLET TWICE A DAY. 90 tablet 3  . meloxicam (MOBIC) 7.5 MG tablet Take 1 tablet by mouth daily. At bedtime    . Secukinumab (COSENTYX) 150 MG/ML SOSY Inject 300 mg into the skin every 30 (thirty) days.     . varenicline (CHANTIX PAK) 0.5 MG X 11 & 1 MG X 42 tablet Take one 0.5 mg tablet by mouth once daily for 3 days, then increase to one 0.5 mg tablet twice daily for 4 days, then increase to one 1 mg tablet twice daily. (Patient not taking: Reported on 05/27/2015) 53 tablet 5  . [DISCONTINUED] doxepin (SINEQUAN) 10 MG capsule 1-2 at bedtime     . [DISCONTINUED] fexofenadine (ALLEGRA) 180 MG tablet Take 180 mg by mouth daily.       No current facility-administered medications on file prior to visit.    Review of Systems  Constitutional: Negative for unusual diaphoresis or night sweats HENT: Negative for ringing in ear or discharge Eyes: Negative for double vision or worsening visual disturbance.  Respiratory: Negative for choking and stridor.   Gastrointestinal: Negative for vomiting or other signifcant bowel change Genitourinary: Negative for hematuria or change in urine  volume.  Musculoskeletal: Negative for other MSK pain or swelling Skin: Negative for color change and worsening wound.  Neurological: Negative for tremors and numbness other than noted  Psychiatric/Behavioral: Negative for decreased concentration or agitation other than above       Objective:   Physical Exam BP 160/88 mmHg  Pulse 106  Temp(Src) 98.1 F (36.7 C) (Oral)  Ht '5\' 4"'$  (1.626 m)  Wt 259 lb (117.482 kg)  BMI 44.44 kg/m2  SpO2 92% VS noted, mild ill Constitutional: Pt appears in no significant distress HENT: Head: NCAT.  Right Ear: External ear **Note De-Identified Klauer Obfuscation** normal.  Left Ear: External ear normal.  Eyes: . Pupils are equal, round, and reactive to light. Conjunctivae and EOM are normal Bilat tm's with mild erythema.  Max sinus areas mod tender.  Pharynx with mild erythema, no exudate Neck: Normal range of motion. Neck supple. no submandib LA Cardiovascular: Normal rate and regular rhythm.   Pulmonary/Chest: Effort normal and breath sounds decreased without rales or wheezing.  Neurological: Pt is alert. Not confused , motor grossly intact Skin: Skin is warm. Severe plaque like psoriasis to arms rash, no LE edema Psychiatric: Pt behavior is normal. No agitation.     Assessment & Plan:

## 2015-05-27 NOTE — Progress Notes (Signed)
**Note De-identified Metzner Obfuscation** Pre visit review using our clinic review tool, if applicable. No additional management support is needed unless otherwise documented below in the visit note. 

## 2015-05-31 ENCOUNTER — Telehealth: Payer: Self-pay | Admitting: Internal Medicine

## 2015-05-31 ENCOUNTER — Telehealth: Payer: Self-pay | Admitting: Hematology

## 2015-05-31 NOTE — Telephone Encounter (Signed)
**Note De-Identified Matteo Obfuscation** Pt called you back. She can be reached at [phone]

## 2015-05-31 NOTE — Telephone Encounter (Signed)
**Note De-Identified Don Obfuscation** new patient appt-s/w patient and gave np appt for 09/02 @ 10 w/Dr. Irene Limbo Referring Dr. Cathlean Cower Dx-Polycythemia

## 2015-05-31 NOTE — Telephone Encounter (Signed)
**Note De-identified Melikian Obfuscation** Pt advised of lab results.  

## 2015-06-03 ENCOUNTER — Ambulatory Visit (HOSPITAL_BASED_OUTPATIENT_CLINIC_OR_DEPARTMENT_OTHER): Payer: Medicare Other

## 2015-06-03 ENCOUNTER — Telehealth: Payer: Self-pay | Admitting: Hematology

## 2015-06-03 ENCOUNTER — Ambulatory Visit (HOSPITAL_BASED_OUTPATIENT_CLINIC_OR_DEPARTMENT_OTHER): Payer: Medicare Other | Admitting: Hematology

## 2015-06-03 ENCOUNTER — Other Ambulatory Visit: Payer: Medicare Other

## 2015-06-03 ENCOUNTER — Encounter: Payer: Self-pay | Admitting: Hematology

## 2015-06-03 VITALS — BP 168/78 | HR 110 | Temp 98.3°F | Resp 20 | Ht 64.0 in | Wt 252.5 lb

## 2015-06-03 DIAGNOSIS — D751 Secondary polycythemia: Secondary | ICD-10-CM

## 2015-06-03 DIAGNOSIS — D45 Polycythemia vera: Secondary | ICD-10-CM | POA: Diagnosis not present

## 2015-06-03 LAB — CBC & DIFF AND RETIC
BASO%: 0.1 % (ref 0.0–2.0)
Basophils Absolute: 0 10*3/uL (ref 0.0–0.1)
EOS ABS: 0.1 10*3/uL (ref 0.0–0.5)
EOS%: 0.8 % (ref 0.0–7.0)
HEMATOCRIT: 57.8 % — AB (ref 34.8–46.6)
HGB: 18.6 g/dL — ABNORMAL HIGH (ref 11.6–15.9)
IMMATURE RETIC FRACT: 13.3 % — AB (ref 1.60–10.00)
LYMPH%: 11.4 % — AB (ref 14.0–49.7)
MCH: 34.2 pg — ABNORMAL HIGH (ref 25.1–34.0)
MCHC: 32.2 g/dL (ref 31.5–36.0)
MCV: 106.3 fL — ABNORMAL HIGH (ref 79.5–101.0)
MONO#: 0.9 10*3/uL (ref 0.1–0.9)
MONO%: 6 % (ref 0.0–14.0)
NEUT#: 11.9 10*3/uL — ABNORMAL HIGH (ref 1.5–6.5)
NEUT%: 81.7 % — AB (ref 38.4–76.8)
PLATELETS: 169 10*3/uL (ref 145–400)
RBC: 5.44 10*6/uL (ref 3.70–5.45)
RDW: 15.8 % — ABNORMAL HIGH (ref 11.2–14.5)
RETIC CT ABS: 87.04 10*3/uL (ref 33.70–90.70)
Retic %: 1.6 % (ref 0.70–2.10)
WBC: 14.6 10*3/uL — ABNORMAL HIGH (ref 3.9–10.3)
lymph#: 1.7 10*3/uL (ref 0.9–3.3)
nRBC: 0 % (ref 0–0)

## 2015-06-03 LAB — FERRITIN CHCC: Ferritin: 79 ng/ml (ref 9–269)

## 2015-06-03 LAB — COMPREHENSIVE METABOLIC PANEL (CC13)
ALT: 24 U/L (ref 0–55)
ANION GAP: 7 meq/L (ref 3–11)
AST: 19 U/L (ref 5–34)
Albumin: 3.6 g/dL (ref 3.5–5.0)
Alkaline Phosphatase: 157 U/L — ABNORMAL HIGH (ref 40–150)
BILIRUBIN TOTAL: 0.53 mg/dL (ref 0.20–1.20)
BUN: 16 mg/dL (ref 7.0–26.0)
CALCIUM: 10 mg/dL (ref 8.4–10.4)
CO2: 38 meq/L — AB (ref 22–29)
CREATININE: 0.8 mg/dL (ref 0.6–1.1)
Chloride: 99 mEq/L (ref 98–109)
EGFR: 82 mL/min/{1.73_m2} — ABNORMAL LOW (ref >90)
Glucose: 113 mg/dl (ref 70–140)
Potassium: 4.9 mEq/L (ref 3.5–5.1)
Sodium: 144 mEq/L (ref 136–145)
TOTAL PROTEIN: 8 g/dL (ref 6.4–8.3)

## 2015-06-03 LAB — CHCC SMEAR

## 2015-06-03 NOTE — Telephone Encounter (Signed)
**Note De-Identified Langseth Obfuscation** Gave and printed appt sched and avs for pt for sept

## 2015-06-03 NOTE — Progress Notes (Signed)
**Note De-Identified Wisnewski Obfuscation** Marland Kitchen    HEMATOLOGY/ONCOLOGY CONSULTATION NOTE  Date of Service: 06/03/2015  Patient Care Team: Biagio Borg, MD as PCP - General (Internal Medicine) Biagio Borg, MD as Attending Physician (Internal Medicine)  CHIEF COMPLAINTS/PURPOSE OF CONSULTATION:  Polycythemia  HISTORY OF PRESENTING ILLNESS:  Hailey Hernandez is a wonderful 59 y.o. female who has been referred to [place] by Marian Sorrow, MD for evaluation and management of  Polycythemia.   She has a history of COPD [on home oxygen], sleep apnea [uses daily CPAP, last sleep study 8-10 years ago], obesity, smoking, severe psoriasis who was noted to have a hemoglobin level of 17.5 and a hematocrit of 52.6 on 05/27/2015 and was referred to [place] for evaluation of polycythemia .  A review of her labs shows that she has been somewhat polycythemic at least from December 2009 but recently her hemoglobin/hematocrit levels have been higher. She notes no previous CVAs/MIs or venous thromboembolic events. She notes that she continues to smoke. Reports that she has been compliant with her CPAP machine and does not feel that it blows enough air and has not had any repeat sleep studies in the last 8-10 years. She continues to use home oxygen for her COPD and notes that she keeps it on all the time.  She is on Lasix for leg swelling. No dizziness/visual changes, new chest pain or shortness of breath.  Denies any family history of blood problems, bleeding problems or clotting problems. Notes that she does have a carbon monoxide detector at home.   MEDICAL HISTORY:  Past Medical History  Diagnosis Date  . Tachyarrhythmia   . Tobacco abuse   . Chronic bronchitis   . History of thyroid cancer   . Rhinitis   . Polymyalgia   . Diverticulitis   . DJD (degenerative joint disease)     spine  . Arthritis   . COPD (chronic obstructive pulmonary disease)   . Hyperlipidemia   . GERD (gastroesophageal reflux disease)   . Fibromyalgia   . Arthritis     bil  legs  . Asthma   . Glaucoma   . Allergy   . Anxiety disorder   . Depression   . Cancer   . Complication of anesthesia     20 yrs ago turned blue after surgery  . HTN (hypertension)     no longer on BP medication  . Shortness of breath   . OSA (obstructive sleep apnea)     CPAP  last sleep study 8-10 yr.ag0  . Psoriasis   . Impaired glucose tolerance 05/08/2013  . Atrial fibrillation     SURGICAL HISTORY: Past Surgical History  Procedure Laterality Date  . Total abdominal hysterectomy    . Cholecystectomy    . Appendectomy    . Carpal tunnel release    . Left shoulder spurs    . Orif right lower leg    . Cholesteatoma excision      right ear  . Carpal tunnel release  10/30/2011    Procedure: CARPAL TUNNEL RELEASE;  Surgeon: Wynonia Sours, MD;  Location: [facility];  Service: Orthopedics;  Laterality: Right;  and mass excision  . Partial throidectomy      SOCIAL HISTORY: Social History   Social History  . Marital Status: Married    Spouse Name: N/A  . Number of Children: N/A  . Years of Education: 9   Occupational History  . disabled   .     Social History **Note De-Identified Huntsberry Obfuscation** Main Topics  . Smoking status: Current Every Day Smoker -- 1.00 packs/day for 45 years    Types: Cigarettes  . Smokeless tobacco: Never Used     Comment: pt states that she has chantix but is waiting until after seeing Dr. Oneida Alar before taking them  . Alcohol Use: No  . Drug Use: No  . Sexual Activity: Not on file   Other Topics Concern  . Not on file   Social History Narrative   Married w/ children   Daily caffeine use    FAMILY HISTORY: Family History  Problem Relation Age of Onset  . Emphysema Sister   . Hypertension Sister   . Heart attack Sister   . Emphysema Sister   . Alpha-1 antitrypsin deficiency Sister   . Alpha-1 antitrypsin deficiency Sister   . Allergies Sister   . Asthma Sister   . Heart disease Mother   . Cancer Mother   . Hyperlipidemia Mother   . Hypertension  Mother   . Heart attack Mother   . Heart disease Father   . Heart attack Father   . Cancer Sister   . Cancer Brother   . Hyperlipidemia Brother   . Hypertension Brother   . Tuberculosis Other     grandmother  . Hyperlipidemia Daughter   . Hypertension Daughter   . Hypertension Son     ALLERGIES:  is allergic to doxycycline; milnacipran; and tramadol.  MEDICATIONS:  Current Outpatient Prescriptions  Medication Sig Dispense Refill  . albuterol (PROVENTIL) (2.5 MG/3ML) 0.083% nebulizer solution Take 3 mLs (2.5 mg total) by nebulization every 4 (four) hours as needed for wheezing or shortness of breath. DX: 496 180 vial prn  . atorvastatin (LIPITOR) 80 MG tablet Take 1 tablet (80 mg total) by mouth daily. 90 tablet 3  . Azelastine-Fluticasone (DYMISTA) 137-50 MCG/ACT SUSP Place 2 sprays into both nostrils at bedtime. (Patient not taking: Reported on 06/10/2015) 1 Bottle 5  . budesonide-formoterol (SYMBICORT) 160-4.5 MCG/ACT inhaler Inhale 2 puffs into the lungs 2 (two) times daily. 1 Inhaler 2  . clotrimazole (LOTRIMIN) 1 % cream APPLY AS DIRECTED BY YOUR PHYSICIAN. -FOR TOPICAL USE- 30 g 0  . folic acid (FOLVITE) 1 MG tablet Take 1 mg by mouth daily.     . furosemide (LASIX) 40 MG tablet TAKE 1 TABLET DAILY (Patient taking differently: TAKE 1 TABLET DAILY prn) 90 tablet 3  . gabapentin (NEURONTIN) 300 MG capsule TAKE 1 TO 2 CAPSULES AT NIGHT AS NEEDED FOR PAIN 180 capsule 0  . LORazepam (ATIVAN) 0.5 MG tablet TAKE 1 TABLET 2 TIMES DAILY AS NEEDED FOR ANXIETY 60 tablet 3  . methotrexate (RHEUMATREX) 2.5 MG tablet 25 mg once a week. On wednesday Caution:Chemotherapy. Protect from light.    . montelukast (SINGULAIR) 10 MG tablet Take 1 tablet (10 mg total) by mouth daily. 90 tablet 3  . Nebulizers (COMPRESSOR/NEBULIZER) MISC Use up to 4 times daily when needed 1 each 0  . nicotine (NICODERM CQ) 21 mg/24hr patch Place 1 patch (21 mg total) onto the skin daily. 28 patch 3  . omeprazole  (PRILOSEC) 20 MG capsule TAKE (1) CAPSULE DAILY 30 capsule 11  . potassium chloride (K-DUR) 10 MEQ tablet TAKE  (1)  TABLET TWICE A DAY. (Patient taking differently: TAKE  (1)  TABLET TWICE A DAY. when taking lasix) 90 tablet 3  . predniSONE (DELTASONE) 10 MG tablet 4 X 2 DAYS, 3 X 2 DAYS, 2 X 2 DAYS, 1 X 2 DAYS 20 **Note De-Identified Martucci Obfuscation** tablet 0  . [DISCONTINUED] doxepin (SINEQUAN) 10 MG capsule 1-2 at bedtime     . [DISCONTINUED] fexofenadine (ALLEGRA) 180 MG tablet Take 180 mg by mouth daily.       No current facility-administered medications for this visit.    REVIEW OF SYSTEMS:    10 Point review of Systems was done is negative except as noted above.  PHYSICAL EXAMINATION: ECOG PERFORMANCE STATUS: 2 - Symptomatic, <50% confined to bed  . Filed Vitals:   06/03/15 1043  Height: '5\' 4"'$  (1.626 m)  Weight: 252 lb 8 oz (114.533 kg)   Filed Weights   06/03/15 1043  Weight: 252 lb 8 oz (114.533 kg)   .Body mass index is 43.32 kg/(m^2).  GENERAL:alert, in no acute distress and comfortable, on portable oxygen by nasal cannula SKIN: Multiple areas of scaly rash consistent with psoriasis all over body EYES: normal, conjunctiva are pink and non-injected, sclera clear OROPHARYNX:no exudate, no erythema and lips, buccal mucosa, and tongue normal  NECK: supple, no JVD, thyroid normal size, non-tender, without nodularity LYMPH:  no palpable lymphadenopathy in the cervical, axillary or inguinal LUNGS: Distant breath sounds, clear to auscultation with normal respiratory effort HEART: regular rate & rhythm,  no murmurs  ABDOMEN: abdomen obese, soft, non-tender, normoactive bowel sounds . No overtly palpable hepatosplenomegaly noted though examination limited by body habitus. EXT: Bilateral chronic venous stasis changes and lower extremities with 1+ lymphedema. PSYCH: alert & oriented x 3 with fluent speech NEURO: no focal motor/sensory deficits  LABORATORY DATA:  I have reviewed the data as listed  . CBC  Latest Ref Rng 05/27/2015 11/26/2014 10/16/2013  WBC 4.0 - 10.5 K/uL 10.9(H) 9.0 11.8(H)  Hemoglobin 12.0 - 15.0 g/dL 17.5(H) 16.8(H) 16.2(H)  Hematocrit 36.0 - 46.0 % 52.6(H) 50.0(H) 48.8(H)  Platelets 150.0 - 400.0 K/uL 175.0 190.0 269.0    . CMP Latest Ref Rng 06/03/2015 05/27/2015 11/26/2014  Glucose 70 - 140 mg/dl 113 138(H) 298(H)  BUN 7.0 - 26.0 mg/dL 16.0 9 12  Creatinine 0.6 - 1.1 mg/dL 0.8 0.62 0.71  Sodium 136 - 145 mEq/L 144 141 137  Potassium 3.5 - 5.1 mEq/L 4.9 4.2 4.1  Chloride 96 - 112 mEq/L - 97 99  CO2 22 - 29 mEq/L 38(H) 36(H) 36(H)  Calcium 8.4 - 10.4 mg/dL 10.0 9.7 8.8  Total Protein 6.4 - 8.3 g/dL 8.0 7.8 7.6  Total Bilirubin 0.20 - 1.20 mg/dL 0.53 0.5 0.4  Alkaline Phos 40 - 150 U/L 157(H) 142(H) 130(H)  AST 5 - 34 U/L '19 14 16  '$ ALT 0 - 55 U/L '24 14 14          '$ RADIOGRAPHIC STUDIES: I have personally reviewed the radiological images as listed and agreed with the findings in the report. No results found.  ASSESSMENT & PLAN:   Mrs. Cranor is a 59 year old, patient female with  #1 Polycythemia. This likely appears to be secondary to multiple factors including her active smoking with significantly increased carboxyhemoglobin levels, COPD, sleep apnea with possibly suboptimal CPAP settings and an element of hemoconcentration from diuretics. Patient had an ultrasound of the abdomen in February 2014 that did not show any splenomegaly. Liver looked diffusely coarse suggesting diffuse fatty infiltration. Patient has some mild leukocytosis but no accompanying thrombocytosis.  Patient had hemoglobin of 18 and hematocrit of 53 in summer 2009. She has been on methotrexate in the last 3-4 years for her bad psoriasis and her hemoglobin and hematocrit were lower. She notes that her methotrexate dose **Note De-Identified Schone Obfuscation** was recently reduced and the increasing hemoglobin and hematocrit appears to tract with that.  We are ruling out a primary bone marrow disorder namely polycythemia vera. Her Jak2  V617F mutation testing was negative which makes the likelihood of this being a primary polycythemia vera less than 5%.  Plan -We'll check a Jak2 exon 12 mutation that is present in about 5% of patients with polycythemia vera. -She needs to address the etiologies off secondary polycythemia including her ongoing smoking as noted by a carboxyhemoglobin of 16 which is pretty high. -Discuss with primary care physician about repeating a sleep study to optimize CPAP settings. -Optimize oxygen usage for her COPD. -Consider low-dose daily aspirin. -for polycythemia vera to treatment goal for therapeutic phlebotomy would be to keep her hematocrit less than 45 however for secondary polycythemia is no clear defined goal and the risk of CVA/MI due to hyperviscosity tends to be lower. -If this is secondary polycythemia as expected then would consider therapeutic phlebotomy to maintain her hematocrit less than 52 if it remains elevated despite addressing all her secondary etiologies leading to compensatory polycythemia. -Patient counseled to maintain good hydration. -Primary care physician to consider reducing her diuretic dose.  Return to care in 2 weeks with Dr. Irene Limbo with repeat CBC, CMP and with the nursing appointment for possible therapeutic phlebotomy.  All of the patients questions were answered with apparent satisfaction. The patient knows to call the clinic with any problems, questions or concerns.  I spent 60 minutes counseling the patient face to face. The total time spent in the appointment was 80 minutes and more than 50% was on counseling and direct patient cares.    Sullivan Lone MD [place] AAHIVMS [facility] Hematology/Oncology Physician [facility]  (Office):       [phone] (Work cell):  [phone] (Fax):           [phone]  06/03/2015 11:00 AM

## 2015-06-07 LAB — CARBOXYHEMOGLOBIN: Carboxyhemoglobin: 16 %TOTAL HGB — ABNORMAL HIGH (ref <=12)

## 2015-06-09 ENCOUNTER — Other Ambulatory Visit: Payer: Self-pay | Admitting: Internal Medicine

## 2015-06-09 NOTE — Telephone Encounter (Signed)
**Note De-identified Corpus Obfuscation** Rx faxed to pharmacy  

## 2015-06-09 NOTE — Telephone Encounter (Signed)
**Note De-identified Yeates Obfuscation** Done hardcopy to Dahlia  

## 2015-06-10 ENCOUNTER — Ambulatory Visit (INDEPENDENT_AMBULATORY_CARE_PROVIDER_SITE_OTHER): Payer: Medicare Other | Admitting: Internal Medicine

## 2015-06-10 ENCOUNTER — Encounter: Payer: Self-pay | Admitting: Internal Medicine

## 2015-06-10 VITALS — BP 142/68 | HR 98 | Ht 64.0 in | Wt 254.0 lb

## 2015-06-10 DIAGNOSIS — G4733 Obstructive sleep apnea (adult) (pediatric): Secondary | ICD-10-CM

## 2015-06-10 DIAGNOSIS — D751 Secondary polycythemia: Secondary | ICD-10-CM

## 2015-06-10 DIAGNOSIS — Z23 Encounter for immunization: Secondary | ICD-10-CM | POA: Diagnosis not present

## 2015-06-10 DIAGNOSIS — Z720 Tobacco use: Secondary | ICD-10-CM

## 2015-06-10 DIAGNOSIS — J449 Chronic obstructive pulmonary disease, unspecified: Secondary | ICD-10-CM

## 2015-06-10 DIAGNOSIS — J9611 Chronic respiratory failure with hypoxia: Secondary | ICD-10-CM

## 2015-06-10 DIAGNOSIS — F17200 Nicotine dependence, unspecified, uncomplicated: Secondary | ICD-10-CM

## 2015-06-10 MED ORDER — NICOTINE 21 MG/24HR TD PT24: 21.0000 mg | MEDICATED_PATCH | Freq: Every day | TRANSDERMAL | Status: DC

## 2015-06-10 MED ORDER — PREDNISONE 10 MG PO TABS: ORAL_TABLET | ORAL | Status: DC

## 2015-06-10 NOTE — Assessment & Plan Note (Signed)
**Note De-Identified Wymore Obfuscation** She continues to use CPAP with oxygen all night every night and says she can't sleep without it

## 2015-06-10 NOTE — Patient Instructions (Addendum)
**Note De-Identified Parrack Obfuscation** Order- DME [name]   Download CPAP for pressure compliance    Dx OSA, chronic respiratory failure with hypoxia             ONOX on CPAP plus O2 2L           Please stop smoking !!  Script for Nicoderm CQ nicotine patches  Prednisone  script

## 2015-06-10 NOTE — Assessment & Plan Note (Addendum)
**Note De-Identified Cudney Obfuscation** Probable recent acute exacerbation. At this time of year, fall weeds pollens or a viral illness would be most likely. Plan-emphasis on smoking cessation. Flu vaccine., Prednisone taper

## 2015-06-10 NOTE — Assessment & Plan Note (Signed)
**Note De-Identified Schriever Obfuscation** Smoking cessation emphasized again. Plan-nicotine patches with discussion

## 2015-06-10 NOTE — Progress Notes (Signed)
**Note De-Identified Karwowski Obfuscation** Patient ID: Hailey Hernandez, female    DOB: 1956/03/07, 59 y.o.   MRN: [id number]  HPI 59 female with known hx of chronic bronchitis , OSA/OHS , active smoker   02/05/11- 59 yoFwith chronic bronchitis, tobacco use, OSA, obesity hypoventilation syndrome. Last here December 15, 2010. Since then has continued Symbicort. Comes with sister today. Sleeping 2 nights ago- woke smothered. Had to pull off CPAP. She gradually cleared over several minutes. Not clear if she refluxed, had mucus plug or ??. Today is typical with no residual. Chest congestion varies day to day. Says cough better, not coughing up anything purulent or bloody. Still averaging 1.5 PPD, down from 3 PPD.  Compliant with CPAP- needs new mask.   07/12/2011 Acute OV  Presents for an acute office visit. Complains pain in right side x 3 days--more painful with cough and movement.  cough with green sputum--. She has been sick for 2 weeks, cough, congestion , drainage. Was exposed to URI symptoms at hospital . Started with lots of drippy nose and drainge.  No fever. Initially phoned in a zpack with no improvement. Then started on Omnicef 4 days ago.  Does have some wheezing . Now pain with inspiration esp along right lateral ribs. Tender to touch and with movement. No rash or fever.   08/10/11- 59 yoFwith chronic bronchitis, tobacco use, OSA, obesity hypoventilation syndrome. Several family members here.Marland Kitchen PCP Dr Edrick Oh She describes persistent ear ache on the right, nasal discharge from the left with little bit of bloody mucus. Distal coughing productively with light yellow sputum, no blood. Questions low-grade fever mainly because she sometimes feels chilly. Smoking is down from 3 packs a day to a little under one pack per day. I encouraged her to keep working at. She gives history of a cholesteatoma and told never to put water in her right ear.  12/10/11- 59 yoFwith chronic bronchitis, tobacco use, OSA, obesity hypoventilation syndrome.  Sister  here. Her sister [name] died in her sleep of end-stage COPD complicated by alpha-1 antitrypsin deficiency. We had called in Augmentin which did help some. Complains now of sinus pressure, blowing bloody scabs from her nose, postnasal drip, cough productive of thick mucus. Denies fever, swollen glands, headache. She has decided that she finally wants to quit smoking. Has reduced from 3 packs per day habit to one pack per day. We discussed support, medical reasons and available techniques and answered her questions.  05/15/12-59 yoFwith chronic bronchitis, tobacco use, OSA, obesity hypoventilation syndrome.  Sister here.  Pt states breathing has been "okay" since last seen. Pt c/o increased SOB and allergy trouble with HA . Pt denies prod cough, wheezing and chest tightness.. unhappy with [name] and wants to change to [name]. CPAP 13/ O2 2L/ [facility] Parking requested  COPD assessment test (CAT) score 14/40 CXR 07/12/11- IMPRESSION:  Bronchitic changes. Left base atelectasis.  Original Report Authenticated By: Raelyn Number, M.D.   09/15/12- 59 yoFwith chronic bronchitis, tobacco use, OSA, obesity hypoventilation syndrome.  Sister here. Follows for: pt states last couple of weeks increase sob,productive cough.chest tightness.denies any wheezing.  For 2 weeks her eyes had been itching and she has had frontal headache, watery rhinorrhea and a little bit of epistaxis. Scratchy throat. Questions low-grade fever. Treating with antihistamines. She continues oxygen 2-3 L per minute/ Apothecary. Continues good compliance with CPAP 13+ oxygen 2 L at night. She is making no effort to stop smoking despite our counseling. CXR 05/15/12-reviewed IMPRESSION:  Stable **Note De-Identified Nosbisch Obfuscation** bronchitic changes and interstitial prominence.  Lingular scarring or atelectasis.  Borderline cardiomegaly.  Original Report Authenticated By: Raelyn Number, M.D.   01/30/13-  59 yoFwith chronic bronchitis, tobacco use, OSA,  obesity hypoventilation syndrome.  Sister here. FOLLOWS FOR: states she is having increased SOB Home oxygen [facility] using 3 L at home, 2 L portable. CPAP 13+ oxygen 2 L for sleep Expresses interest in quitting smoking and asks for Chantix which we discussed. Productive cough with some chest congestion, white or yellow sputum. No blood, no chest pain. New problem-3 weeks, aching right upper tibia anteriorly keeps her awake at hurts to bear weight. CXR 11/07/12 IMPRESSION:  Stable chest x-ray with cardiomegaly. No active lung disease.  Original Report Authenticated By: Ivar Drape, M.D.  06/05/13- 59 yoFwith chronic bronchitis, tobacco use, OSA, obesity hypoventilation syndrome.  Sister here. FOLLOWS FOR: Breathing unchanged since last OV.  Needs to discuss oxygen today Home oxygen  Apothe[facility]ary using 3 L at home, 2 L portable. CPAP 13+ oxygen 2 L for sleep Anxious about trying Chantix, but decided to try it- discussed. Failed Wellbutrin.  10/09/13- 59 yoFwith chronic bronchitis, tobacco use (1 ppd/ 45 pk yrs), OSA, obesity hypoventilation syndrome.  Sister here. FOLLOWS FOR:  Still having sob, cough (non-productive) and wheezing.  Also, headaches and facial swelling, painfull  to the touch. CPAP 13+ oxygen 2 L for sleep/ [facility] She looked into a portable concentrator but home care can't provide it with her insurance. Upper respiratory infection/sinusitis over Christmas, treated with Keflex which helped but she still has nasal congestion and blows small amounts of blood from her nose without headache or fever.  02/05/14-  59 yoF smoker with chronic bronchitis, tobacco use (1 ppd/ 45 pk yrs), OSA, obesity hypoventilation syndrome. Complicated by psoriasis             Grand daughter here. FOLLOWS FOR:  Reports breathing unchanged since last OV.  Wearing CPAP 13 + O2 2L/ [place] [facility]  Daily cough productive white sputum. Nothing bloody or purulent. She continues to smoke and gave  up on Chantix  07/23/14- 59 yoF smoker with chronic bronchitis/ COPD, tobacco use (1 ppd/ 45 pk yrs), OSA, obesity hypoventilation syndrome. Complicated by psoriasis             Grand daughter here. FOLLOW FOR: COPD;  CXR 02/05/14 IMPRESSION:  No segmental infiltrate or pulmonary edema. Slight worsening central  bronchitic changes. Stable chronic interstitial prominence.  Electronically Signed  By: Lahoma Crocker M.D.  On: 02/05/2014 13:31  07/23/14- 21 yoF smoker with chronic bronchitis/ COPD, tobacco use (1 ppd/ 45 pk yrs), OSA, obesity hypoventilation syndrome. Complicated by psoriasis             Sister here. FOLLOW FOR: COPD; SOB; wheezing Nasal drainage, cough prod white.  On methotrexate and Cosentyx for psoriasis Avoids lasix- makes gout flare up. Admits fluid retention. Continues CPAP 13/ O2 2L. Wants to change back to [place] from [name] for better service.  12/03/14- 14 yoF smoker with chronic bronchitis/ COPD, tobacco use (1 ppd/ 45 pk yrs), OSA, obesity hypoventilation syndrome. Complicated by psoriasis             Sister here FOLLOWS LNL:[id number] to wear  CPAP 13/O2 2L/ [facility].  Has not been able to stop smoking and I don't think she is really trying despite our counseling efforts. Says her breathing is "okay". Blames allergy for burning and itching of eyes and nose over the last 2 months. Her **Note De-Identified Trudell Obfuscation** primary physician gave Singulair and Depo-Medrol 1 week ago. She is using Flonase. She questions whether symptoms actually are reaction to her psoriasis medication Cosentyx. Review of Epocrates does suggest this may be the case.   06/10/15- 54 yoF smoker with chronic bronchitis/ COPD, tobacco use (1 ppd/ 45 pk yrs), OSA, obesity hypoventilation syndrome. Complicated by psoriasis             Sister here FOLLOWS FOR: Pt states her SOB is at baseline. Pt c/o prod cough with clear mucus, watery eyes, rhinorrhea, sinus pressure x 2 weeks. Pt was given a zpak and pred by PCP and the s/s  improved but have now worsened. Pt denies CP/tightness and f/c/s.  Increased cough head and chest congestion, probably for a month. Transient benefit after Z-Pak and prednisone. She routinely takes Zyrtec and Singulair daily, Flonase. Continues CPAP 13 with oxygen 2 L/[facility] Being assessed for polycythemia. Carboxyhemoglobin was elevated consistent with COPD and smoking. Home has electrical heat. Discussed smoking again. Insurance won't pay for Chantix. She is willing to try nicotine patch. CXR 12/03/14 IMPRESSION: No active cardiopulmonary disease. Stable COPD. Electronically Signed  By: Lahoma Crocker M.D.  On: 12/03/2014 13:02   Review of Systems-see HPI Constitutional:   No-   weight loss, night sweats, fevers, chills, fatigue, lassitude. HEENT:    headaches, difficulty swallowing, tooth/dental problems, sore throat,       + sneezing,+ itching,  ear ache,  +nasal congestion, +post nasal drip,  CV:  No-   chest pain, orthopnea, PND, +swelling in lower extremities, anasarca,   dizziness, palpitations Resp: +  shortness of breath with exertion or at rest.              + productive cough,  + non-productive cough,  No- coughing up of blood.              No-   change in color of mucus.  + wheezing.   Skin: psoriasis GI:  No-   heartburn, indigestion, abdominal pain, nausea, vomiting,  GU: No-    MS:  + joint pain or swelling. . Neuro-     nothing unusual Psych:  No- change in mood or affect. No depression or anxiety.  No memory loss.   Objective:   Physical Exam General- Alert, Oriented, Affect-appropriate, Distress- none acute, obese. On 2L O2 sat 93% Skin- +extensive psoriasis plaques Lymphadenopathy- none Head- atraumatic            Eyes- Gross vision intact, PERRLA, conjunctivae clear secretions            Ears- Hearing, canals- cerumen/ debris R            Nose- + turbinate edema, no-Septal dev, mucus, polyps, erosion, perforation             Throat- Mallampati III , mucosa clear  , drainage- none, tonsils- atrophic.                        +dentures Neck- flexible , trachea midline, no stridor , thyroid nl, carotid no bruit Chest - symmetrical excursion , unlabored           Heart/CV- RRR , no murmur , no gallop  , no rub, nl s1 s2                           - JVD- none , edema- none, stasis dermatitis changes+ bilateral, varices- none **Note De-Identified Traughber Obfuscation** Lung- loose rhonchi +, dullness-none, rub- none, + shallow tachypnea           Chest wall-  Abd-  Br/ Gen/ Rectal- Not done, not indicated Extrem- +apparent lipoma right medial knee with stasis changes Neuro- grossly intact to observation

## 2015-06-10 NOTE — Assessment & Plan Note (Signed)
**Note De-Identified Nuno Obfuscation** Hemoglobin 18. We can help assessment by checking oxygen saturation, especially at night when she is less attentive. She does say that she sleeps with CPAP 13 compliant [place] and uses oxygen through it at 2 L. Plan-overnight oximetry on oxygen plus CPAP. CPAP download to document compliance and control.

## 2015-06-12 DIAGNOSIS — G4733 Obstructive sleep apnea (adult) (pediatric): Secondary | ICD-10-CM | POA: Diagnosis not present

## 2015-06-12 DIAGNOSIS — J449 Chronic obstructive pulmonary disease, unspecified: Secondary | ICD-10-CM | POA: Diagnosis not present

## 2015-06-12 DIAGNOSIS — L400 Psoriasis vulgaris: Secondary | ICD-10-CM | POA: Diagnosis not present

## 2015-06-12 DIAGNOSIS — T85511A Breakdown (mechanical) of esophageal anti-reflux device, initial encounter: Secondary | ICD-10-CM | POA: Diagnosis not present

## 2015-06-13 NOTE — Progress Notes (Signed)
**Note De-Identified Ok[place]aki Obfuscation** Jak 2 exon 12 region mutation analysis-negative for mutation. Patient unlikely to have polycythemia vera. Secondary polycythemia due to multiple factors as noted above. Plan Will initiate therapeutic phlebotomy to maintain hematocrit of around 52 or lower. -Patient follow-up with primary care physician to work on other factors causing secondary polycythemia including smoking, COPD, hemoconcentration due to diuretics and sleep apnea.  Sullivan Lone MD MS Hematology/Oncology Physician [facility]

## 2015-06-17 ENCOUNTER — Other Ambulatory Visit (HOSPITAL_BASED_OUTPATIENT_CLINIC_OR_DEPARTMENT_OTHER): Payer: Medicare Other

## 2015-06-17 ENCOUNTER — Telehealth: Payer: Self-pay | Admitting: Hematology

## 2015-06-17 ENCOUNTER — Other Ambulatory Visit: Payer: Self-pay | Admitting: *Deleted

## 2015-06-17 ENCOUNTER — Ambulatory Visit (HOSPITAL_BASED_OUTPATIENT_CLINIC_OR_DEPARTMENT_OTHER): Payer: Medicare Other | Admitting: Hematology

## 2015-06-17 VITALS — BP 162/72 | HR 103 | Temp 97.9°F | Resp 21 | Ht 64.0 in | Wt 256.0 lb

## 2015-06-17 DIAGNOSIS — D751 Secondary polycythemia: Secondary | ICD-10-CM | POA: Diagnosis not present

## 2015-06-17 LAB — CBC WITH DIFFERENTIAL/PLATELET
BASO%: 0.7 % (ref 0.0–2.0)
BASOS ABS: 0.1 10*3/uL (ref 0.0–0.1)
EOS ABS: 0.1 10*3/uL (ref 0.0–0.5)
EOS%: 1.2 % (ref 0.0–7.0)
HCT: 54.1 % — ABNORMAL HIGH (ref 34.8–46.6)
HGB: 17.9 g/dL — ABNORMAL HIGH (ref 11.6–15.9)
LYMPH%: 13.8 % — AB (ref 14.0–49.7)
MCH: 33.5 pg (ref 25.1–34.0)
MCHC: 33 g/dL (ref 31.5–36.0)
MCV: 101.4 fL — AB (ref 79.5–101.0)
MONO#: 0.9 10*3/uL (ref 0.1–0.9)
MONO%: 7.2 % (ref 0.0–14.0)
NEUT#: 9 10*3/uL — ABNORMAL HIGH (ref 1.5–6.5)
NEUT%: 77.1 % — AB (ref 38.4–76.8)
Platelets: 174 10*3/uL (ref 145–400)
RBC: 5.34 10*6/uL (ref 3.70–5.45)
RDW: 16.2 % — ABNORMAL HIGH (ref 11.2–14.5)
WBC: 11.8 10*3/uL — ABNORMAL HIGH (ref 3.9–10.3)
lymph#: 1.6 10*3/uL (ref 0.9–3.3)

## 2015-06-17 LAB — COMPREHENSIVE METABOLIC PANEL (CC13)
ALK PHOS: 147 U/L (ref 40–150)
ALT: 19 U/L (ref 0–55)
AST: 16 U/L (ref 5–34)
Albumin: 3.4 g/dL — ABNORMAL LOW (ref 3.5–5.0)
Anion Gap: 9 mEq/L (ref 3–11)
BUN: 9.7 mg/dL (ref 7.0–26.0)
CO2: 35 meq/L — AB (ref 22–29)
Calcium: 9.5 mg/dL (ref 8.4–10.4)
Chloride: 99 mEq/L (ref 98–109)
Creatinine: 0.7 mg/dL (ref 0.6–1.1)
EGFR: 89 mL/min/{1.73_m2} — AB (ref >90)
GLUCOSE: 123 mg/dL (ref 70–140)
POTASSIUM: 4.4 meq/L (ref 3.5–5.1)
SODIUM: 143 meq/L (ref 136–145)
Total Bilirubin: 0.56 mg/dL (ref 0.20–1.20)
Total Protein: 7.4 g/dL (ref 6.4–8.3)

## 2015-06-17 NOTE — Telephone Encounter (Signed)
**Note De-identified Newbold Obfuscation** per pof to sch pt appt-gave pt copy of avs °

## 2015-06-21 ENCOUNTER — Encounter: Payer: Self-pay | Admitting: Hematology

## 2015-06-21 NOTE — Progress Notes (Signed)
**Note De-Identified Velie Obfuscation** Marland Kitchen    HEMATOLOGY/ONCOLOGY CLINIC NOTE  Date of Service: 06/17/2015 Patient Care Team: Biagio Borg, MD as PCP - General (Internal Medicine) Biagio Borg, MD as Attending Physician (Internal Medicine)  CHIEF COMPLAINTS/PURPOSE OF CONSULTATION:  Polycythemia  HISTORY OF PRESENTING ILLNESS:  Please see my initial consultation for details on initial presentation   Interval history  Hailey Hernandez  Is here for her scheduled follow-up for management of polycythemia. She notes she is doing well with no acute new concerns. We talked about the fact that her clonal mutation studies were negative suggesting secondary polycythemia related to her COPD, sleep apnea, smoking.  Her hematocrit today is down to 54.1 from her last value of 57.8.  We discussed that they aren't very clear guidelines about what level of hematocrit leads to risk reduction in secondary polycythemia. I offered her possible therapeutic phlebotomy to try to keep her hematocrit below 52. She wants to work on management of her causative factors and hold off on therapeutic phlebotomy at this time.  No acute new concerns or questions.  MEDICAL HISTORY:  Past Medical History  Diagnosis Date  . Tachyarrhythmia   . Tobacco abuse   . Chronic bronchitis   . History of thyroid cancer   . Rhinitis   . Polymyalgia   . Diverticulitis   . DJD (degenerative joint disease)     spine  . Arthritis   . COPD (chronic obstructive pulmonary disease)   . Hyperlipidemia   . GERD (gastroesophageal reflux disease)   . Fibromyalgia   . Arthritis     bil legs  . Asthma   . Glaucoma   . Allergy   . Anxiety disorder   . Depression   . Cancer   . Complication of anesthesia     20 yrs ago turned blue after surgery  . HTN (hypertension)     no longer on BP medication  . Shortness of breath   . OSA (obstructive sleep apnea)     CPAP  last sleep study 8-10 yr.ag0  . Psoriasis   . Impaired glucose tolerance 05/08/2013  . Atrial fibrillation      SURGICAL HISTORY: Past Surgical History  Procedure Laterality Date  . Total abdominal hysterectomy    . Cholecystectomy    . Appendectomy    . Carpal tunnel release    . Left shoulder spurs    . Orif right lower leg    . Cholesteatoma excision      right ear  . Carpal tunnel release  10/30/2011    Procedure: CARPAL TUNNEL RELEASE;  Surgeon: Wynonia Sours, MD;  Location: [place];  Service: Orthopedics;  Laterality: Right;  and mass excision  . Partial throidectomy      SOCIAL HISTORY: Social History   Social History  . Marital Status: Married    Spouse Name: N/A  . Number of Children: N/A  . Years of Education: 9   Occupational History  . disabled   .     Social History Main Topics  . Smoking status: Current Every Day Smoker -- 1.00 packs/day for 45 years    Types: Cigarettes  . Smokeless tobacco: Never Used     Comment: pt states that she has chantix but is waiting until after seeing Dr. Oneida Alar before taking them  . Alcohol Use: No  . Drug Use: No  . Sexual Activity: Not on file   Other Topics Concern  . Not on file   Social History **Note De-Identified Zandi Obfuscation** Narrative   Married w/ children   Daily caffeine use    FAMILY HISTORY: Family History  Problem Relation Age of Onset  . Emphysema Sister   . Hypertension Sister   . Heart attack Sister   . Emphysema Sister   . Alpha-1 antitrypsin deficiency Sister   . Alpha-1 antitrypsin deficiency Sister   . Allergies Sister   . Asthma Sister   . Heart disease Mother   . Cancer Mother   . Hyperlipidemia Mother   . Hypertension Mother   . Heart attack Mother   . Heart disease Father   . Heart attack Father   . Cancer Sister   . Cancer Brother   . Hyperlipidemia Brother   . Hypertension Brother   . Tuberculosis Other     grandmother  . Hyperlipidemia Daughter   . Hypertension Daughter   . Hypertension Son     ALLERGIES:  is allergic to doxycycline; milnacipran; and tramadol.  MEDICATIONS:  Current  Outpatient Prescriptions  Medication Sig Dispense Refill  . albuterol (PROVENTIL) (2.5 MG/3ML) 0.083% nebulizer solution Take 3 mLs (2.5 mg total) by nebulization every 4 (four) hours as needed for wheezing or shortness of breath. DX: 496 180 vial prn  . atorvastatin (LIPITOR) 80 MG tablet Take 1 tablet (80 mg total) by mouth daily. 90 tablet 3  . Azelastine-Fluticasone (DYMISTA) 137-50 MCG/ACT SUSP Place 2 sprays into both nostrils at bedtime. (Patient not taking: Reported on 06/10/2015) 1 Bottle 5  . budesonide-formoterol (SYMBICORT) 160-4.5 MCG/ACT inhaler Inhale 2 puffs into the lungs 2 (two) times daily. 1 Inhaler 2  . clotrimazole (LOTRIMIN) 1 % cream APPLY AS DIRECTED BY YOUR PHYSICIAN. -FOR TOPICAL USE- 30 g 0  . folic acid (FOLVITE) 1 MG tablet Take 1 mg by mouth daily.     . furosemide (LASIX) 40 MG tablet TAKE 1 TABLET DAILY (Patient taking differently: TAKE 1 TABLET DAILY prn) 90 tablet 3  . gabapentin (NEURONTIN) 300 MG capsule TAKE 1 TO 2 CAPSULES AT NIGHT AS NEEDED FOR PAIN 180 capsule 0  . LORazepam (ATIVAN) 0.5 MG tablet TAKE 1 TABLET 2 TIMES DAILY AS NEEDED FOR ANXIETY 60 tablet 3  . methotrexate (RHEUMATREX) 2.5 MG tablet 25 mg once a week. On wednesday Caution:Chemotherapy. Protect from light.    . montelukast (SINGULAIR) 10 MG tablet Take 1 tablet (10 mg total) by mouth daily. 90 tablet 3  . Nebulizers (COMPRESSOR/NEBULIZER) MISC Use up to 4 times daily when needed 1 each 0  . nicotine (NICODERM CQ) 21 mg/24hr patch Place 1 patch (21 mg total) onto the skin daily. 28 patch 3  . omeprazole (PRILOSEC) 20 MG capsule TAKE (1) CAPSULE DAILY 30 capsule 11  . potassium chloride (K-DUR) 10 MEQ tablet TAKE  (1)  TABLET TWICE A DAY. (Patient taking differently: TAKE  (1)  TABLET TWICE A DAY. when taking lasix) 90 tablet 3  . predniSONE (DELTASONE) 10 MG tablet 4 X 2 DAYS, 3 X 2 DAYS, 2 X 2 DAYS, 1 X 2 DAYS 20 tablet 0  . [DISCONTINUED] doxepin (SINEQUAN) 10 MG capsule 1-2 at bedtime      . [DISCONTINUED] fexofenadine (ALLEGRA) 180 MG tablet Take 180 mg by mouth daily.       No current facility-administered medications for this visit.    REVIEW OF SYSTEMS:    10 Point review of Systems was done is negative except as noted above.  PHYSICAL EXAMINATION: ECOG PERFORMANCE STATUS: 2 - Symptomatic, <50% confined to bed  . **Note De-Identified Almquist Obfuscation** Filed Vitals:   06/17/15 1121  Height: '5\' 4"'$  (1.626 m)  Weight: 256 lb (116.121 kg)   Filed Weights   06/17/15 1121  Weight: 256 lb (116.121 kg)   .Body mass index is 43.92 kg/(m^2).  GENERAL:alert, in no acute distress and comfortable, on portable oxygen by nasal cannula SKIN: Multiple areas of scaly rash consistent with psoriasis all over body EYES: normal, conjunctiva are pink and non-injected, sclera clear OROPHARYNX:no exudate, no erythema and lips, buccal mucosa, and tongue normal  NECK: supple, no JVD, thyroid normal size, non-tender, without nodularity LYMPH:  no palpable lymphadenopathy in the cervical, axillary or inguinal LUNGS: Distant breath sounds, clear to auscultation with normal respiratory effort HEART: regular rate & rhythm,  no murmurs  ABDOMEN: abdomen obese, soft, non-tender, normoactive bowel sounds . No overtly palpable hepatosplenomegaly noted though examination limited by body habitus. EXT: Bilateral chronic venous stasis changes and lower extremities with 1+ lymphedema. PSYCH: alert & oriented x 3 with fluent speech NEURO: no focal motor/sensory deficits  LABORATORY DATA:  I have reviewed the data as listed  . CBC Latest Ref Rng 06/17/2015 06/03/2015 05/27/2015  WBC 3.9 - 10.3 10e3/uL 11.8(H) 14.6(H) 10.9(H)  Hemoglobin 11.6 - 15.9 g/dL 17.9(H) 18.6(H) 17.5(H)  Hematocrit 34.8 - 46.6 % 54.1(H) 57.8(H) 52.6(H)  Platelets 145 - 400 10e3/uL 174 169 175.0    . CMP Latest Ref Rng 06/17/2015 06/03/2015 05/27/2015  Glucose 70 - 140 mg/dl 123 113 138(H)  BUN 7.0 - 26.0 mg/dL 9.7 16.0 9  Creatinine 0.6 - 1.1 mg/dL 0.7 0.8  0.62  Sodium 136 - 145 mEq/L 143 144 141  Potassium 3.5 - 5.1 mEq/L 4.4 4.9 4.2  Chloride 96 - 112 mEq/L - - 97  CO2 22 - 29 mEq/L 35(H) 38(H) 36(H)  Calcium 8.4 - 10.4 mg/dL 9.5 10.0 9.7  Total Protein 6.4 - 8.3 g/dL 7.4 8.0 7.8  Total Bilirubin 0.20 - 1.20 mg/dL 0.56 0.53 0.5  Alkaline Phos 40 - 150 U/L 147 157(H) 142(H)  AST 5 - 34 U/L '16 19 14  '$ ALT 0 - 55 U/L '19 24 14     '$ carboxyhemoglobin level 16         RADIOGRAPHIC STUDIES: I have personally reviewed the radiological images as listed and agreed with the findings in the report. No results found.  ASSESSMENT & PLAN:   Mrs. Matlin is a 58 year old, patient female with  #1 Secondary Polycythemia. This likely appears to be secondary to multiple factors including her active smoking with significantly increased carboxyhemoglobin levels, COPD, sleep apnea with possibly suboptimal CPAP settings and an element of hemoconcentration from diuretics. Patient had an ultrasound of the abdomen in February 2014 that did not show any splenomegaly. Liver looked diffusely coarse suggesting diffuse fatty infiltration. Patient has some mild leukocytosis but no accompanying thrombocytosis.  Patient had hemoglobin of 18 and hematocrit of 53 in summer 2009. She has been on methotrexate in the last 3-4 years for her bad psoriasis and her hemoglobin and hematocrit were lower. She notes that her methotrexate dose was recently reduced and the increasing hemoglobin and hematocrit appears to tract with that.  We are ruling out a primary bone marrow disorder namely polycythemia vera. Her Jak2 V617F and Jak2 Exon 12 mutation testing were negative which makes the likelihood of this being a primary polycythemia vera less highly unlikely  Plan - We discussed her test results and the fact that this does not appear to be consistent with polycythemia vera and more consistent **Note De-Identified Piccione Obfuscation** with secondary polycythemia. - we discussed that they're unclear guidelines with  regards to whether therapeutic phlebotomy to  Control high hematocrit helps with risk reduction in secondary polycythemia. - We  offered her the option of considering therapeutic phlebotomy to keep her hematocrit below 52.  But she wants to hold off on this currently and work on the causative factors as much as she can which is reasonable. - she notes that her primary care physician has referred her for the sleep study to adjust CPAP settings. -Optimize oxygen usage for her COPD. -Consider low-dose daily aspirin. -Patient counseled to maintain good hydration. -Primary care physician to consider reducing her diuretic dose.  Return to care in 2 months with Dr. Irene Limbo with repeat CBC, CMP and with the nursing appointment for possible therapeutic phlebotomy.  All of the patients questions were answered to her apparent satisfaction. The patient knows to call the clinic with any problems, questions or concerns.  The total time spent in the appointment was 25 minutes and more than 50% was on counseling and direct patient cares.    Sullivan Lone MD [place] AAHIVMS [facility] Hematology/Oncology Physician [facility]  (Office):       [phone] (Work cell):  [phone] (Fax):           [phone]

## 2015-06-24 ENCOUNTER — Telehealth: Payer: Self-pay | Admitting: Internal Medicine

## 2015-06-24 DIAGNOSIS — J9611 Chronic respiratory failure with hypoxia: Secondary | ICD-10-CM

## 2015-06-24 NOTE — Telephone Encounter (Signed)
**Note De-identified Acres Obfuscation** LMTCB-will ask for [name].  

## 2015-06-27 NOTE — Telephone Encounter (Signed)
**Note De-Identified Minish Obfuscation** [phone] calling back

## 2015-06-27 NOTE — Telephone Encounter (Signed)
**Note De-Identified Dost Obfuscation** Spoke with patient-states her ONO was done on CPAP and 3 l/m O2; results given to US show at 2l/m. CY please advise; results attached to phone message.

## 2015-06-30 DIAGNOSIS — Z5181 Encounter for therapeutic drug level monitoring: Secondary | ICD-10-CM | POA: Diagnosis not present

## 2015-07-01 NOTE — Telephone Encounter (Signed)
**Note De-Identified [place] Obfuscation** Spoke with Arbie Cookey at [facility] stated to have patient on CPAP and 2L/M for ONO; per CY lets send order to [facility] to have patient's O2 increased to 3l/m QHS. Order placed.

## 2015-07-05 ENCOUNTER — Encounter: Payer: Self-pay | Admitting: Internal Medicine

## 2015-07-05 ENCOUNTER — Telehealth: Payer: Self-pay | Admitting: Family

## 2015-07-05 DIAGNOSIS — J208 Acute bronchitis due to other specified organisms: Secondary | ICD-10-CM

## 2015-07-05 MED ORDER — BENZONATATE 100 MG PO CAPS: 100.0000 mg | ORAL_CAPSULE | Freq: Two times a day (BID) | ORAL | Status: DC | PRN

## 2015-07-05 MED ORDER — CEPASTAT 14.5 MG MT LOZG: 1.0000 | LOZENGE | OROMUCOSAL | Status: DC | PRN

## 2015-07-05 NOTE — Progress Notes (Signed)
**Note De-Identified Jarrett Obfuscation** We are sorry that you are not feeling well.  Here is how we plan to help!  Based on what you have shared with me it looks like you have upper respiratory tract inflammation that has resulted in a significant cough.  Inflammation and infection in the upper respiratory tract is commonly called bronchitis and has four common causes:  Allergies, Viral Infections, Acid Reflux and Bacterial Infections.  Allergies, viruses and acid reflux are treated by controlling symptoms or eliminating the cause. An example might be a cough caused by taking certain blood pressure medications. You stop the cough by changing the medication. Another example might be a cough caused by acid reflux. Controlling the reflux helps control the cough.  Based on your presentation I believe you most likely have A cough due to a virus.  This is called viral bronchitis and is best treated by rest, plenty of fluids and control of the cough.  You may use Ibuprofen or Tylenol as directed to help your symptoms.    In addition you may use A non-prescription cough medication called Robitussin DAC. Take 2 teaspoons every 8 hours or Delsym: take 2 teaspoons every 12 hours. and A prescription cough medication called Tessalon Perles '100mg'$ . You may take 1-2 capsules every 8 hours as needed for your cough.   I have also prescribed cepastat lozenges, take 1 every 2 hours as needed for sore throat. At this point in time, it is likely viral and you should clear up soon. However, if you not, follow the instructions below.     HOME CARE . Only take medications as instructed by your medical team. . Complete the entire course of an antibiotic. . Drink plenty of fluids and get plenty of rest. . Avoid close contacts especially the very young and the elderly . Cover your mouth if you cough or cough into your sleeve. . Always remember to wash your hands . A steam or ultrasonic humidifier can help congestion.    GET HELP RIGHT AWAY IF: . You develop  worsening fever. . You become short of breath . You cough up blood. . Your symptoms persist after you have completed your treatment plan MAKE SURE YOU   Understand these instructions.  Will watch your condition.  Will get help right away if you are not doing well or get worse.  Your e-visit answers were reviewed by a board certified advanced clinical practitioner to complete your personal care plan.  Depending on the condition, your plan could have included both over the counter or prescription medications. If there is a problem please reply  once you have received a response from your provider. Your safety is important to [place].  If you have drug allergies check your prescription carefully.    You can use MyChart to ask questions about today's visit, request a non-urgent call back, or ask for a work or school excuse for 24 hours related to this e-Visit. If it has been greater than 24 hours you will need to follow up with your provider, or enter a new e-Visit to address those concerns. You will get an e-mail in the next two days asking about your experience.  I hope that your e-visit has been valuable and will speed your recovery. Thank you for using e-visits.

## 2015-07-06 ENCOUNTER — Encounter (HOSPITAL_COMMUNITY): Payer: Self-pay

## 2015-07-06 ENCOUNTER — Emergency Department (HOSPITAL_COMMUNITY)
Admission: EM | Admit: 2015-07-06 | Discharge: 2015-07-06 | Disposition: A | Discharge status: Home / Self Care | Payer: Medicare Other | Attending: Emergency Medicine | Admitting: Emergency Medicine

## 2015-07-06 ENCOUNTER — Telehealth: Payer: Self-pay | Admitting: Internal Medicine

## 2015-07-06 ENCOUNTER — Emergency Department (HOSPITAL_COMMUNITY): Payer: Medicare Other

## 2015-07-06 DIAGNOSIS — R509 Fever, unspecified: Secondary | ICD-10-CM | POA: Insufficient documentation

## 2015-07-06 DIAGNOSIS — Z872 Personal history of diseases of the skin and subcutaneous tissue: Secondary | ICD-10-CM | POA: Diagnosis not present

## 2015-07-06 DIAGNOSIS — F329 Major depressive disorder, single episode, unspecified: Secondary | ICD-10-CM | POA: Insufficient documentation

## 2015-07-06 DIAGNOSIS — Z7951 Long term (current) use of inhaled steroids: Secondary | ICD-10-CM | POA: Diagnosis not present

## 2015-07-06 DIAGNOSIS — J449 Chronic obstructive pulmonary disease, unspecified: Secondary | ICD-10-CM | POA: Diagnosis not present

## 2015-07-06 DIAGNOSIS — M797 Fibromyalgia: Secondary | ICD-10-CM | POA: Diagnosis not present

## 2015-07-06 DIAGNOSIS — F419 Anxiety disorder, unspecified: Secondary | ICD-10-CM | POA: Diagnosis not present

## 2015-07-06 DIAGNOSIS — H409 Unspecified glaucoma: Secondary | ICD-10-CM | POA: Diagnosis not present

## 2015-07-06 DIAGNOSIS — G4733 Obstructive sleep apnea (adult) (pediatric): Secondary | ICD-10-CM | POA: Diagnosis not present

## 2015-07-06 DIAGNOSIS — E669 Obesity, unspecified: Secondary | ICD-10-CM | POA: Insufficient documentation

## 2015-07-06 DIAGNOSIS — Z720 Tobacco use: Secondary | ICD-10-CM | POA: Insufficient documentation

## 2015-07-06 DIAGNOSIS — Z85850 Personal history of malignant neoplasm of thyroid: Secondary | ICD-10-CM | POA: Diagnosis not present

## 2015-07-06 DIAGNOSIS — K219 Gastro-esophageal reflux disease without esophagitis: Secondary | ICD-10-CM | POA: Diagnosis not present

## 2015-07-06 DIAGNOSIS — E785 Hyperlipidemia, unspecified: Secondary | ICD-10-CM | POA: Insufficient documentation

## 2015-07-06 DIAGNOSIS — I10 Essential (primary) hypertension: Secondary | ICD-10-CM | POA: Diagnosis not present

## 2015-07-06 DIAGNOSIS — R0602 Shortness of breath: Secondary | ICD-10-CM | POA: Diagnosis not present

## 2015-07-06 DIAGNOSIS — Z79899 Other long term (current) drug therapy: Secondary | ICD-10-CM | POA: Insufficient documentation

## 2015-07-06 DIAGNOSIS — J441 Chronic obstructive pulmonary disease with (acute) exacerbation: Secondary | ICD-10-CM | POA: Diagnosis not present

## 2015-07-06 DIAGNOSIS — R6889 Other general symptoms and signs: Secondary | ICD-10-CM | POA: Diagnosis not present

## 2015-07-06 DIAGNOSIS — R05 Cough: Secondary | ICD-10-CM | POA: Diagnosis not present

## 2015-07-06 LAB — CBC WITH DIFFERENTIAL/PLATELET
BASOS ABS: 0 10*3/uL (ref 0.0–0.1)
Basophils Relative: 0 %
Eosinophils Absolute: 0.2 10*3/uL (ref 0.0–0.7)
Eosinophils Relative: 1 %
HEMATOCRIT: 56.9 % — AB (ref 36.0–46.0)
HEMOGLOBIN: 18.7 g/dL — AB (ref 12.0–15.0)
LYMPHS ABS: 1.4 10*3/uL (ref 0.7–4.0)
LYMPHS PCT: 10 %
MCH: 35 pg — ABNORMAL HIGH (ref 26.0–34.0)
MCHC: 32.9 g/dL (ref 30.0–36.0)
MCV: 106.6 fL — AB (ref 78.0–100.0)
Monocytes Absolute: 0.7 10*3/uL (ref 0.1–1.0)
Monocytes Relative: 5 %
NEUTROS ABS: 12 10*3/uL — AB (ref 1.7–7.7)
NEUTROS PCT: 84 %
PLATELETS: 138 10*3/uL — AB (ref 150–400)
RBC: 5.34 MIL/uL — AB (ref 3.87–5.11)
RDW: 16.2 % — ABNORMAL HIGH (ref 11.5–15.5)
WBC: 14.2 10*3/uL — AB (ref 4.0–10.5)

## 2015-07-06 LAB — BASIC METABOLIC PANEL
ANION GAP: 7 (ref 5–15)
BUN: 10 mg/dL (ref 6–20)
CHLORIDE: 97 mmol/L — AB (ref 101–111)
CO2: 37 mmol/L — AB (ref 22–32)
Calcium: 8.7 mg/dL — ABNORMAL LOW (ref 8.9–10.3)
Creatinine, Ser: 0.58 mg/dL (ref 0.44–1.00)
GFR calc Af Amer: >60 mL/min (ref >60)
GLUCOSE: 123 mg/dL — AB (ref 65–99)
POTASSIUM: 4.2 mmol/L (ref 3.5–5.1)
Sodium: 141 mmol/L (ref 135–145)

## 2015-07-06 MED ORDER — PREDNISONE 50 MG PO TABS: ORAL_TABLET | ORAL | Status: DC

## 2015-07-06 MED ORDER — LEVOFLOXACIN 500 MG PO TABS: 500.0000 mg | ORAL_TABLET | Freq: Every day | ORAL | Status: DC

## 2015-07-06 MED ORDER — IPRATROPIUM-ALBUTEROL 0.5-2.5 (3) MG/3ML IN SOLN
3.0000 mL | Freq: Once | RESPIRATORY_TRACT | Status: AC
  Administered 2015-07-06: 3 mL via RESPIRATORY_TRACT
  Filled 2015-07-06: qty 3

## 2015-07-06 NOTE — Discharge Instructions (Signed)
**Note De-Identified Marinez Obfuscation** X-ray shows no pneumonia. Continue nebulizer treatment. Prescription for antibiotic and prednisone.

## 2015-07-06 NOTE — Telephone Encounter (Signed)
**Note De-Identified Weathersby Obfuscation** LMTCB for [employer]

## 2015-07-06 NOTE — ED Notes (Signed)
**Note De-Identified Kouns Obfuscation** Pt sent from urgent care, according to NP at [facility] urgent care, pt presents with history of copd and has had cough, sob, and low grade fever x 2 weeks.  Reported chest x ray showed no pneumonia but pt's 02 sat 88-90% on 3 liters and pt's HR 120.  NP gave pt duoneb and '125mg'$  solumedrol prior to sending pt to ER for further evaluation.  Reports pt on home 02, 2 liters at rest and 3 liters with exertion.  Also wears cpap at night.

## 2015-07-06 NOTE — ED Provider Notes (Signed)
**Note De-Identified Bieri Obfuscation** CSN: [id number]     Arrival date & time 07/06/15  1133 History  By signing my name below, I, Terressa Koyanagi, attest that this documentation has been prepared under the direction and in the presence of No att. providers found. Electronically Signed: Terressa Koyanagi, ED Scribe. 07/06/2015. 12:57 PM.  Chief Complaint  Patient presents with  . Shortness of Breath   The history is provided by the patient. No language interpreter was used.   PCP: Cathlean Cower, MD HPI Comments: Hailey Hernandez is a 59 y.o. female, with PMHx noted below including COPD, daily tobacco use (1ppd), asthma, HTN, cancer, home O2 use (2L/min at rest, 3L/min with standing and exertion), and OSA, who presents to the Emergency Department, at the direction of [facility], complaining of worsening SOB originally onset 2 weeks ago. Associated Sx include: cough, wheezing, and low grade fever onset 2 weeks ago. Pt reports she was seen by her PCP last week for the same Sx who started her on prednisone and Z-Pak which pt completed. Pt denies using her nebulizer at home stating she misplaced it and only recently recovered it. Pt reports she was given a shot of '125mg'$  solumedrol at [facility] P[facility] to the ED.    Past Medical History  Diagnosis Date  . Tachyarrhythmia   . Tobacco abuse   . Chronic bronchitis   . History of thyroid cancer   . Rhinitis   . Polymyalgia ([place])   . Diverticulitis   . DJD (degenerative joint disease)     spine  . Arthritis   . COPD (chronic obstructive pulmonary disease) ([place])   . Hyperlipidemia   . GERD (gastroesophageal reflux disease)   . Fibromyalgia   . Arthritis     bil legs  . Asthma   . Glaucoma   . Allergy   . Anxiety disorder   . Depression   . Cancer ([place])   . Complication of anesthesia     20 yrs ago turned blue after surgery  . HTN (hypertension)     no longer on BP medication  . Shortness of breath   . OSA (obstructive sleep apnea)     CPAP  last sleep study 8-10 yr.ag0  .  Psoriasis   . Impaired glucose tolerance 05/08/2013  . Atrial fibrillation [facility])    Past Surgical History  Procedure Laterality Date  . Total abdominal hysterectomy    . Cholecystectomy    . Appendectomy    . Carpal tunnel release    . Left shoulder spurs    . Orif right lower leg    . Cholesteatoma excision      right ear  . Carpal tunnel release  10/30/2011    Procedure: CARPAL TUNNEL RELEASE;  Surgeon: Wynonia Sours, MD;  Location: [facility];  Service: Orthopedics;  Laterality: Right;  and mass excision  . Partial throidectomy     Family History  Problem Relation Age of Onset  . Emphysema Sister   . Hypertension Sister   . Heart attack Sister   . Emphysema Sister   . Alpha-1 antitrypsin deficiency Sister   . Alpha-1 antitrypsin deficiency Sister   . Allergies Sister   . Asthma Sister   . Heart disease Mother   . Cancer Mother   . Hyperlipidemia Mother   . Hypertension Mother   . Heart attack Mother   . Heart disease Father   . Heart attack Father   . Cancer Sister   . Cancer Brother   . **Note De-Identified Cervini Obfuscation** Hyperlipidemia Brother   . Hypertension Brother   . Tuberculosis Other     grandmother  . Hyperlipidemia Daughter   . Hypertension Daughter   . Hypertension Son    Social History  Substance Use Topics  . Smoking status: Current Every Day Smoker -- 1.00 packs/day for 45 years    Types: Cigarettes  . Smokeless tobacco: Never Used     Comment: pt states that she has chantix but is waiting until after seeing Dr. Oneida Alar before taking them  . Alcohol Use: No   OB History    No data available     Review of Systems  Constitutional: Positive for fever.  Respiratory: Positive for cough, shortness of breath and wheezing.    A complete 10 system review of systems was obtained and all systems are negative except as noted in the HPI and PMH.  Allergies  Doxycycline; Tramadol; and Milnacipran  Home Medications   Prior to Admission medications   Medication Sig Start  Date End Date Taking? Authorizing Provider  albuterol (PROVENTIL) (2.5 MG/3ML) 0.083% nebulizer solution Take 3 mLs (2.5 mg total) by nebulization every 4 (four) hours as needed for wheezing or shortness of breath. DX: 496 05/28/12  Yes Deneise Lever, MD  atorvastatin (LIPITOR) 80 MG tablet Take 1 tablet (80 mg total) by mouth daily. 11/26/14  Yes Biagio Borg, MD  Azelastine-Fluticasone [facility]) 137-50 MCG/ACT SUSP Place 2 sprays into both nostrils at bedtime. Patient taking differently: Place 2 sprays into both nostrils daily.  12/23/14  Yes Deneise Lever, MD  budesonide-formoterol (SYMBICORT) 160-4.5 MCG/ACT inhaler Inhale 2 puffs into the lungs 2 (two) times daily. 04/19/15  Yes Deneise Lever, MD  clotrimazole (LOTRIMIN) 1 % cream APPLY AS DIRECTED BY YOUR PHYSICIAN. -FOR TOPICAL USE- 03/28/15  Yes Biagio Borg, MD  folic acid (FOLVITE) 1 MG tablet Take 1 mg by mouth daily.    Yes Historical Provider, MD  furosemide (LASIX) 40 MG tablet TAKE 1 TABLET DAILY Patient taking differently: TAKE 1 TABLET DAILY prn 02/14/15  Yes Biagio Borg, MD  gabapentin (NEURONTIN) 300 MG capsule TAKE 1 TO 2 CAPSULES AT NIGHT AS NEEDED FOR PAIN 03/28/15  Yes Biagio Borg, MD  LORazepam (ATIVAN) 0.5 MG tablet TAKE 1 TABLET 2 TIMES DAILY AS NEEDED FOR ANXIETY 06/09/15  Yes Biagio Borg, MD  methotrexate (RHEUMATREX) 2.5 MG tablet 25 mg once a week. On wednesday Caution:Chemotherapy. Protect from light.   Yes Historical Provider, MD  montelukast (SINGULAIR) 10 MG tablet Take 1 tablet (10 mg total) by mouth daily. 11/26/14  Yes Biagio Borg, MD  omeprazole (PRILOSEC) 20 MG capsule TAKE (1) CAPSULE DAILY 11/18/14  Yes Biagio Borg, MD  potassium chloride (K-DUR) 10 MEQ tablet Take 10 mEq by mouth 2 (two) times daily as needed (along with Lasix.).   Yes Historical Provider, MD  benzonatate (TESSALON) 100 MG capsule Take 1-2 capsules (100-200 mg total) by mouth 2 (two) times daily as needed for cough. Patient not taking:  Reported on 07/06/2015 07/05/15   Benjamine Mola, FNP  levofloxacin (LEVAQUIN) 500 MG tablet Take 1 tablet (500 mg total) by mouth daily. 07/06/15   Nat Christen, MD  Nebulizers (COMPRESSOR/NEBULIZER) MISC Use up to 4 times daily when needed 02/05/11   Deneise Lever, MD  nicotine (NICODERM CQ) 21 mg/24hr patch Place 1 patch (21 mg total) onto the skin daily. Patient not taking: Reported on 07/06/2015 06/10/15   Deneise Lever, MD **Note De-Identified Iacovelli Obfuscation** phenol-menthol (CEPASTAT) 14.5 MG lozenge Place 1 lozenge inside cheek as needed for sore throat. Patient not taking: Reported on 07/06/2015 07/05/15   Benjamine Mola, FNP  predniSONE (DELTASONE) 50 MG tablet 1 tablet for 5 days, one half tablet for 5 days 07/06/15   Nat Christen, MD   Triage Vitals: BP 155/86 mmHg  Pulse 107  Temp(Src) 98.7 F (37.1 C) (Oral)  Resp 29  SpO2 89% Physical Exam  Constitutional: She is oriented to person, place, and time. She appears well-developed and well-nourished.  Obese. No frank respiratory distress.  HENT:  Head: Normocephalic and atraumatic.  Eyes: Conjunctivae and EOM are normal. Pupils are equal, round, and reactive to light.  Neck: Normal range of motion. Neck supple.  Cardiovascular: Normal rate and regular rhythm.   Pulmonary/Chest: Effort normal. She has wheezes (expiratory wheezing bilaterally ).  Abdominal: Soft. Bowel sounds are normal.  Musculoskeletal: Normal range of motion.  Neurological: She is alert and oriented to person, place, and time.  Skin: Skin is warm and dry.  Psychiatric: She has a normal mood and affect. Her behavior is normal.  Nursing note and vitals reviewed.   ED Course  Procedures (including critical care time) DIAGNOSTIC STUDIES: Oxygen Saturation is 89% on 3L/min.     COORDINATION OF CARE: 12:19 PM: Discussed treatment plan which includes antibiotics, prednisone, breathing treatment, labs, and imaging with pt at bedside; patient verbalizes understanding and agrees with treatment plan.  Labs  Review Labs Reviewed  CBC WITH DIFFERENTIAL/PLATELET - Abnormal; Notable for the following:    WBC 14.2 (*)    RBC 5.34 (*)    Hemoglobin 18.7 (*)    HCT 56.9 (*)    MCV 106.6 (*)    MCH 35.0 (*)    RDW 16.2 (*)    Platelets 138 (*)    Neutro Abs 12.0 (*)    All other components within normal limits  BASIC METABOLIC PANEL - Abnormal; Notable for the following:    Chloride 97 (*)    CO2 37 (*)    Glucose, Bld 123 (*)    Calcium 8.7 (*)    All other components within normal limits    Imaging Review Dg Chest 2 View  07/06/2015   CLINICAL DATA:  59 year old female with cough, shortness of breath and 2 week history of low-grade fever. Clinical history of COPD.  EXAM: CHEST  2 VIEW  COMPARISON:  Prior chest x-ray 07/06/2015  FINDINGS: Stable mild cardiomegaly. Atherosclerotic calcifications noted in the transverse aorta. The mediastinal contours are within normal limits. Similar appearance of diffuse bronchitic change and interstitial prominence. No focal airspace consolidation, pleural effusion, pneumothorax or pulmonary edema. No acute osseous abnormality.  IMPRESSION: 1. Stable chest x-ray with diffuse bronchitic change and mild interstitial prominence but no evidence of acute cardiopulmonary process. 2. Stable cardiomegaly. 3. Aortic atherosclerosis.   Electronically Signed   By: Jacqulynn Cadet M.D.   On: 07/06/2015 13:56   I have personally reviewed and evaluated these images and lab results as part of my medical decision-making.  MDM   Final diagnoses:  COPD exacerbation ([place])   Patient has known serious COPD. Pulse ox noted to be low, but patient is in no acute distress. She is on oxygen at home. Pulses have hovered at 100.  IV steroids given today along with albuterol/Atrovent nebulizer treatment.  Discharge medications Levaquin, prednisone. Patient understands to return if worse.   I, Martice Doty, personally performed the services described in this documentation. All medical **Note De-Identified Charette Obfuscation** record entries made by the scribe were at my direction and in my presence.  I have reviewed the chart and discharge instructions and agree that the record reflects my personal performance and is accurate and complete. Cristela Stalder.  07/06/2015. 8:07 PM.     Nat Christen, MD 07/06/15 2009

## 2015-07-07 NOTE — Telephone Encounter (Signed)
**Note De-Identified Brazzel Obfuscation** lmtcb X2 for pt's daughter.

## 2015-07-08 NOTE — Telephone Encounter (Signed)
**Note De-Identified Polka Obfuscation** LMTCB for pt's daughter. Will sign off on message per triage protocol as we have been unsuccessful in reaching pt three times with no response back.

## 2015-07-11 ENCOUNTER — Telehealth: Payer: Self-pay | Admitting: Internal Medicine

## 2015-07-11 DIAGNOSIS — J449 Chronic obstructive pulmonary disease, unspecified: Secondary | ICD-10-CM

## 2015-07-11 MED ORDER — ALBUTEROL SULFATE (2.5 MG/3ML) 0.083% IN NEBU: 2.5000 mg | INHALATION_SOLUTION | RESPIRATORY_TRACT | Status: DC | PRN

## 2015-07-11 NOTE — Telephone Encounter (Signed)
**Note De-Identified Binning Obfuscation** Called and spoke with pt's sister Sharyn Creamer stated that pt needed a refill on albuterol for nebulizer] She stated that it was suppose to be filled at her last OV but pharmacy stated they did not receive it Informed Pamala Hurry that i would resend it to [facility]  Refill sent electronically to pharmacy  Nothing further is needed at this time.

## 2015-07-12 DIAGNOSIS — T85511A Breakdown (mechanical) of esophageal anti-reflux device, initial encounter: Secondary | ICD-10-CM | POA: Diagnosis not present

## 2015-07-12 DIAGNOSIS — L400 Psoriasis vulgaris: Secondary | ICD-10-CM | POA: Diagnosis not present

## 2015-07-12 DIAGNOSIS — J449 Chronic obstructive pulmonary disease, unspecified: Secondary | ICD-10-CM | POA: Diagnosis not present

## 2015-07-12 DIAGNOSIS — G4733 Obstructive sleep apnea (adult) (pediatric): Secondary | ICD-10-CM | POA: Diagnosis not present

## 2015-07-14 ENCOUNTER — Telehealth: Payer: Self-pay | Admitting: Internal Medicine

## 2015-07-14 DIAGNOSIS — J449 Chronic obstructive pulmonary disease, unspecified: Secondary | ICD-10-CM

## 2015-07-14 MED ORDER — ALBUTEROL SULFATE (2.5 MG/3ML) 0.083% IN NEBU: 2.5000 mg | INHALATION_SOLUTION | RESPIRATORY_TRACT | Status: DC | PRN

## 2015-07-14 NOTE — Telephone Encounter (Signed)
**Note De-Identified Cartier Obfuscation** Rx sent electronically  Pt aware  Nothing further needed per pt

## 2015-08-01 ENCOUNTER — Encounter: Payer: Self-pay | Admitting: Internal Medicine

## 2015-08-12 DIAGNOSIS — G4733 Obstructive sleep apnea (adult) (pediatric): Secondary | ICD-10-CM | POA: Diagnosis not present

## 2015-08-12 DIAGNOSIS — J449 Chronic obstructive pulmonary disease, unspecified: Secondary | ICD-10-CM | POA: Diagnosis not present

## 2015-08-12 DIAGNOSIS — L400 Psoriasis vulgaris: Secondary | ICD-10-CM | POA: Diagnosis not present

## 2015-08-12 DIAGNOSIS — T85511A Breakdown (mechanical) of esophageal anti-reflux device, initial encounter: Secondary | ICD-10-CM | POA: Diagnosis not present

## 2015-08-16 ENCOUNTER — Telehealth: Payer: Self-pay | Admitting: Hematology

## 2015-08-16 NOTE — Telephone Encounter (Signed)
**Note De-Identified Hailey Hernandez** pt cld to r/s appt-gave pt r/s time & date

## 2015-08-17 ENCOUNTER — Other Ambulatory Visit: Payer: Self-pay

## 2015-08-17 ENCOUNTER — Ambulatory Visit: Payer: Self-pay | Admitting: Hematology

## 2015-08-26 ENCOUNTER — Other Ambulatory Visit (HOSPITAL_BASED_OUTPATIENT_CLINIC_OR_DEPARTMENT_OTHER): Payer: Medicare Other

## 2015-08-26 ENCOUNTER — Encounter: Payer: Self-pay | Admitting: Hematology

## 2015-08-26 ENCOUNTER — Other Ambulatory Visit: Payer: Self-pay | Admitting: *Deleted

## 2015-08-26 ENCOUNTER — Ambulatory Visit (HOSPITAL_BASED_OUTPATIENT_CLINIC_OR_DEPARTMENT_OTHER): Payer: Medicare Other | Admitting: Hematology

## 2015-08-26 VITALS — BP 149/72 | HR 92 | Temp 98.3°F | Resp 18 | Ht 64.0 in | Wt 256.6 lb

## 2015-08-26 DIAGNOSIS — D45 Polycythemia vera: Secondary | ICD-10-CM | POA: Diagnosis not present

## 2015-08-26 DIAGNOSIS — D751 Secondary polycythemia: Secondary | ICD-10-CM

## 2015-08-26 LAB — COMPREHENSIVE METABOLIC PANEL (CC13)
ALT: 15 U/L (ref 0–55)
AST: 15 U/L (ref 5–34)
Albumin: 3.3 g/dL — ABNORMAL LOW (ref 3.5–5.0)
Alkaline Phosphatase: 131 U/L (ref 40–150)
Anion Gap: 10 mEq/L (ref 3–11)
BUN: 9.6 mg/dL (ref 7.0–26.0)
CHLORIDE: 99 meq/L (ref 98–109)
CO2: 34 mEq/L — ABNORMAL HIGH (ref 22–29)
Calcium: 9.5 mg/dL (ref 8.4–10.4)
Creatinine: 0.7 mg/dL (ref 0.6–1.1)
EGFR: >90 mL/min/{1.73_m2} (ref >90)
GLUCOSE: 161 mg/dL — AB (ref 70–140)
POTASSIUM: 4.3 meq/L (ref 3.5–5.1)
SODIUM: 142 meq/L (ref 136–145)
Total Bilirubin: 0.66 mg/dL (ref 0.20–1.20)
Total Protein: 7.3 g/dL (ref 6.4–8.3)

## 2015-08-26 LAB — CBC WITH DIFFERENTIAL/PLATELET
BASO%: 0.8 % (ref 0.0–2.0)
BASOS ABS: 0.1 10*3/uL (ref 0.0–0.1)
EOS%: 1.4 % (ref 0.0–7.0)
Eosinophils Absolute: 0.1 10*3/uL (ref 0.0–0.5)
HCT: 50.5 % — ABNORMAL HIGH (ref 34.8–46.6)
HGB: 16.6 g/dL — ABNORMAL HIGH (ref 11.6–15.9)
LYMPH%: 16.5 % (ref 14.0–49.7)
MCH: 33.4 pg (ref 25.1–34.0)
MCHC: 32.8 g/dL (ref 31.5–36.0)
MCV: 101.9 fL — ABNORMAL HIGH (ref 79.5–101.0)
MONO#: 0.6 10*3/uL (ref 0.1–0.9)
MONO%: 6.5 % (ref 0.0–14.0)
NEUT#: 7.5 10*3/uL — ABNORMAL HIGH (ref 1.5–6.5)
NEUT%: 74.8 % (ref 38.4–76.8)
Platelets: 163 10*3/uL (ref 145–400)
RBC: 4.96 10*6/uL (ref 3.70–5.45)
RDW: 17 % — AB (ref 11.2–14.5)
WBC: 10 10*3/uL (ref 3.9–10.3)
lymph#: 1.6 10*3/uL (ref 0.9–3.3)

## 2015-09-08 NOTE — Progress Notes (Signed)
**Note De-Identified Moudy Obfuscation** Marland Kitchen    HEMATOLOGY/ONCOLOGY CLINIC NOTE  Date of Service: 06/17/2015 Patient Care Team: Biagio Borg, MD as PCP - General (Internal Medicine) Biagio Borg, MD as Attending Physician (Internal Medicine)  CHIEF COMPLAINTS/PURPOSE OF CONSULTATION:  Polycythemia  HISTORY OF PRESENTING ILLNESS:  Please see my initial consultation for details on initial presentation   Interval history  Hailey Hernandez  Is here for her scheduled follow-up for management of polycythemia. She notes she is doing well with no acute new concerns. She notes that she has been using her chronic Oxygen more diligently and that her CPAP setting were optimized as well. Her Hgb/HCT seems to be improving with improvement in her oxygenation with decreased instances of hypoxemia. No new headaches/DOE/chest pain/visual blurring. Happy about the improvement in hgb/hct.   MEDICAL HISTORY:  Past Medical History  Diagnosis Date  . Tachyarrhythmia   . Tobacco abuse   . Chronic bronchitis   . History of thyroid cancer   . Rhinitis   . Polymyalgia ([place])   . Diverticulitis   . DJD (degenerative joint disease)     spine  . Arthritis   . COPD (chronic obstructive pulmonary disease) ([place])   . Hyperlipidemia   . GERD (gastroesophageal reflux disease)   . Fibromyalgia   . Arthritis     bil legs  . Asthma   . Glaucoma   . Allergy   . Anxiety disorder   . Depression   . Cancer ([place])   . Complication of anesthesia     20 yrs ago turned blue after surgery  . HTN (hypertension)     no longer on BP medication  . Shortness of breath   . OSA (obstructive sleep apnea)     CPAP  last sleep study 8-10 yr.ag0  . Psoriasis   . Impaired glucose tolerance 05/08/2013  . Atrial fibrillation ([place])     SURGICAL HISTORY: Past Surgical History  Procedure Laterality Date  . Total abdominal hysterectomy    . Cholecystectomy    . Appendectomy    . Carpal tunnel release    . Left shoulder spurs    . Orif right lower leg    . Cholesteatoma  excision      right ear  . Carpal tunnel release  10/30/2011    Procedure: CARPAL TUNNEL RELEASE;  Surgeon: Wynonia Sours, MD;  Location: [facility];  Service: Orthopedics;  Laterality: Right;  and mass excision  . Partial throidectomy      SOCIAL HISTORY: Social History   Social History  . Marital Status: Married    Spouse Name: N/A  . Number of Children: N/A  . Years of Education: 9   Occupational History  . disabled   .     Social History Main Topics  . Smoking status: Current Every Day Smoker -- 1.00 packs/day for 45 years    Types: Cigarettes  . Smokeless tobacco: Never Used     Comment: pt states that she has chantix but is waiting until after seeing Dr. Oneida Alar before taking them  . Alcohol Use: No  . Drug Use: No  . Sexual Activity: Not on file   Other Topics Concern  . Not on file   Social History Narrative   Married w/ children   Daily caffeine use    FAMILY HISTORY: Family History  Problem Relation Age of Onset  . Emphysema Sister   . Hypertension Sister   . Heart attack Sister   . Emphysema Sister   . **Note De-Identified Pacha Obfuscation** Alpha-1 antitrypsin deficiency Sister   . Alpha-1 antitrypsin deficiency Sister   . Allergies Sister   . Asthma Sister   . Heart disease Mother   . Cancer Mother   . Hyperlipidemia Mother   . Hypertension Mother   . Heart attack Mother   . Heart disease Father   . Heart attack Father   . Cancer Sister   . Cancer Brother   . Hyperlipidemia Brother   . Hypertension Brother   . Tuberculosis Other     grandmother  . Hyperlipidemia Daughter   . Hypertension Daughter   . Hypertension Son     ALLERGIES:  is allergic to doxycycline; tramadol; and milnacipran.  MEDICATIONS:  Current Outpatient Prescriptions  Medication Sig Dispense Refill  . albuterol (PROVENTIL) (2.5 MG/3ML) 0.083% nebulizer solution Take 3 mLs (2.5 mg total) by nebulization every 4 (four) hours as needed for wheezing or shortness of breath. DX: 496 180 vial 11  .  atorvastatin (LIPITOR) 80 MG tablet Take 1 tablet (80 mg total) by mouth daily. 90 tablet 3  . Azelastine-Fluticasone (DYMISTA) 137-50 MCG/ACT SUSP Place 2 sprays into both nostrils at bedtime. (Patient taking differently: Place 2 sprays into both nostrils daily. ) 1 Bottle 5  . benzonatate (TESSALON) 100 MG capsule Take 1-2 capsules (100-200 mg total) by mouth 2 (two) times daily as needed for cough. 30 capsule 0  . budesonide-formoterol (SYMBICORT) 160-4.5 MCG/ACT inhaler Inhale 2 puffs into the lungs 2 (two) times daily. 1 Inhaler 2  . clotrimazole (LOTRIMIN) 1 % cream APPLY AS DIRECTED BY YOUR PHYSICIAN. -FOR TOPICAL USE- 30 g 0  . folic acid (FOLVITE) 1 MG tablet Take 1 mg by mouth daily.     . furosemide (LASIX) 40 MG tablet TAKE 1 TABLET DAILY (Patient taking differently: TAKE 1 TABLET DAILY prn) 90 tablet 3  . gabapentin (NEURONTIN) 300 MG capsule TAKE 1 TO 2 CAPSULES AT NIGHT AS NEEDED FOR PAIN 180 capsule 0  . levofloxacin (LEVAQUIN) 500 MG tablet Take 1 tablet (500 mg total) by mouth daily. 10 tablet 0  . LORazepam (ATIVAN) 0.5 MG tablet TAKE 1 TABLET 2 TIMES DAILY AS NEEDED FOR ANXIETY 60 tablet 3  . methotrexate (RHEUMATREX) 2.5 MG tablet 25 mg once a week. On wednesday Caution:Chemotherapy. Protect from light.    . montelukast (SINGULAIR) 10 MG tablet Take 1 tablet (10 mg total) by mouth daily. 90 tablet 3  . Nebulizers (COMPRESSOR/NEBULIZER) MISC Use up to 4 times daily when needed 1 each 0  . nicotine (NICODERM CQ) 21 mg/24hr patch Place 1 patch (21 mg total) onto the skin daily. 28 patch 3  . omeprazole (PRILOSEC) 20 MG capsule TAKE (1) CAPSULE DAILY 30 capsule 11  . phenol-menthol (CEPASTAT) 14.5 MG lozenge Place 1 lozenge inside cheek as needed for sore throat. 100 tablet 0  . potassium chloride (K-DUR) 10 MEQ tablet Take 10 mEq by mouth 2 (two) times daily as needed (along with Lasix.).    . [DISCONTINUED] doxepin (SINEQUAN) 10 MG capsule 1-2 at bedtime     . [DISCONTINUED]  fexofenadine (ALLEGRA) 180 MG tablet Take 180 mg by mouth daily.       No current facility-administered medications for this visit.    REVIEW OF SYSTEMS:    10 Point review of Systems was done is negative except as noted above.  PHYSICAL EXAMINATION: ECOG PERFORMANCE STATUS: 2 - Symptomatic, <50% confined to bed  . Filed Vitals:   08/26/15 1043  Height: '5\' 4"'$  (1.626 m) **Note De-Identified Waight Obfuscation** Weight: 256 lb 9.6 oz (116.393 kg)   Filed Weights   08/26/15 1043  Weight: 256 lb 9.6 oz (116.393 kg)   .Body mass index is 44.02 kg/(m^2).  GENERAL:alert, in no acute distress and comfortable, on portable oxygen by nasal cannula SKIN: Multiple areas of scaly rash consistent with psoriasis all over body EYES: normal, conjunctiva are pink and non-injected, sclera clear OROPHARYNX:no exudate, no erythema and lips, buccal mucosa, and tongue normal  NECK: supple, no JVD, thyroid normal size, non-tender, without nodularity LYMPH:  no palpable lymphadenopathy in the cervical, axillary or inguinal LUNGS: Distant breath sounds, clear to auscultation with normal respiratory effort HEART: regular rate & rhythm,  no murmurs  ABDOMEN: abdomen obese, soft, non-tender, normoactive bowel sounds . No overtly palpable hepatosplenomegaly noted though examination limited by body habitus. EXT: Bilateral chronic venous stasis changes and lower extremities with 1+ lymphedema. PSYCH: alert & oriented x 3 with fluent speech NEURO: no focal motor/sensory deficits  LABORATORY DATA:  I have reviewed the data as listed  . CBC Latest Ref Rng 08/26/2015 07/06/2015 06/17/2015  WBC 3.9 - 10.3 10e3/uL 10.0 14.2(H) 11.8(H)  Hemoglobin 11.6 - 15.9 g/dL 16.6(H) 18.7(H) 17.9(H)  Hematocrit 34.8 - 46.6 % 50.5(H) 56.9(H) 54.1(H)  Platelets 145 - 400 10e3/uL 163 138(L) 174    . CMP Latest Ref Rng 08/26/2015 07/06/2015 06/17/2015  Glucose 70 - 140 mg/dl 161(H) 123(H) 123  BUN 7.0 - 26.0 mg/dL 9.6 10 9.7  Creatinine 0.6 - 1.1 mg/dL 0.7 0.58  0.7  Sodium 136 - 145 mEq/L 142 141 143  Potassium 3.5 - 5.1 mEq/L 4.3 4.2 4.4  Chloride 101 - 111 mmol/L - 97(L) -  CO2 22 - 29 mEq/L 34(H) 37(H) 35(H)  Calcium 8.4 - 10.4 mg/dL 9.5 8.7(L) 9.5  Total Protein 6.4 - 8.3 g/dL 7.3 - 7.4  Total Bilirubin 0.20 - 1.20 mg/dL 0.66 - 0.56  Alkaline Phos 40 - 150 U/L 131 - 147  AST 5 - 34 U/L 15 - 16  ALT 0 - 55 U/L 15 - 19     carboxyhemoglobin level 16         RADIOGRAPHIC STUDIES: I have personally reviewed the radiological images as listed and agreed with the findings in the report. No results found.  ASSESSMENT & PLAN:   Hailey Hernandez is a 59 year old, patient female with  #1 Secondary Polycythemia. This likely appears to be secondary to multiple factors including her active smoking with significantly increased carboxyhemoglobin levels, COPD, sleep apnea with possibly suboptimal CPAP settings and an element of hemoconcentration from diuretics. Patient had an ultrasound of the abdomen in February 2014 that did not show any splenomegaly. Liver looked diffusely coarse suggesting diffuse fatty infiltration. Patient has some mild leukocytosis but no accompanying thrombocytosis.  Patient had hemoglobin of 18 and hematocrit of 53 in summer 2009. She has been on methotrexate in the last 3-4 years for her bad psoriasis and her hemoglobin and hematocrit were lower. She notes that her methotrexate dose was recently reduced and the increasing hemoglobin and hematocrit appears to tract with that.  Her Jak2 V617F and Jak2 Exon 12 mutation testing were negative which makes the likelihood of this being a primary polycythemia vera less highly unlikely.  Her hgb/hct have improved and trended down with more diligent use of her chronic O2 and better CPAP setting additionally proving that these are the predominantly secondary factors causing her polycythemia  Plan -no indication for therapeutic phlebotomy at this time. Would use HCT<52 as **Note De-Identified Glaab Obfuscation** a broad goal to  manage her secondary polycythemia. -continue optimization of her CPAP settings. -Optimize oxygen usage for her COPD. -Consider low-dose daily aspirin. -Patient counseled to maintain good hydration. -Primary care physician to consider reducing her diuretic dose.  As per patients preference she will be discharged back to her PCP for continued monitoring her polycythemia and continue mx of secondary factors. Kindly reconsult [place] if any new questions/concerns arise.  Return to care with Dr Irene Limbo on an as needed basis.  All of the patients questions were answered to her apparent satisfaction. The patient knows to call the clinic with any problems, questions or concerns.  The total time spent in the appointment was 20 minutes and more than 50% was on counseling and direct patient cares.    Sullivan Lone MD [place] AAHIVMS [facility] Hematology/Oncology Physician [facility]  (Office):       [phone] (Work cell):  [phone] (Fax):           [phone]

## 2015-09-11 DIAGNOSIS — L400 Psoriasis vulgaris: Secondary | ICD-10-CM | POA: Diagnosis not present

## 2015-09-11 DIAGNOSIS — G4733 Obstructive sleep apnea (adult) (pediatric): Secondary | ICD-10-CM | POA: Diagnosis not present

## 2015-09-11 DIAGNOSIS — T85511A Breakdown (mechanical) of esophageal anti-reflux device, initial encounter: Secondary | ICD-10-CM | POA: Diagnosis not present

## 2015-09-11 DIAGNOSIS — J449 Chronic obstructive pulmonary disease, unspecified: Secondary | ICD-10-CM | POA: Diagnosis not present

## 2015-09-12 DIAGNOSIS — L409 Psoriasis, unspecified: Secondary | ICD-10-CM | POA: Diagnosis not present

## 2015-09-12 DIAGNOSIS — F17210 Nicotine dependence, cigarettes, uncomplicated: Secondary | ICD-10-CM | POA: Diagnosis not present

## 2015-09-12 DIAGNOSIS — M1990 Unspecified osteoarthritis, unspecified site: Secondary | ICD-10-CM | POA: Diagnosis not present

## 2015-09-12 DIAGNOSIS — J449 Chronic obstructive pulmonary disease, unspecified: Secondary | ICD-10-CM | POA: Diagnosis not present

## 2015-09-16 ENCOUNTER — Other Ambulatory Visit: Payer: Self-pay | Admitting: Internal Medicine

## 2015-09-28 ENCOUNTER — Other Ambulatory Visit: Payer: Self-pay | Admitting: *Deleted

## 2015-09-28 ENCOUNTER — Encounter: Payer: Self-pay | Admitting: Internal Medicine

## 2015-09-28 MED ORDER — BUDESONIDE-FORMOTEROL FUMARATE 160-4.5 MCG/ACT IN AERO: 2.0000 | INHALATION_SPRAY | Freq: Two times a day (BID) | RESPIRATORY_TRACT | Status: DC

## 2015-10-12 DIAGNOSIS — T85511A Breakdown (mechanical) of esophageal anti-reflux device, initial encounter: Secondary | ICD-10-CM | POA: Diagnosis not present

## 2015-10-12 DIAGNOSIS — J449 Chronic obstructive pulmonary disease, unspecified: Secondary | ICD-10-CM | POA: Diagnosis not present

## 2015-10-12 DIAGNOSIS — L400 Psoriasis vulgaris: Secondary | ICD-10-CM | POA: Diagnosis not present

## 2015-10-12 DIAGNOSIS — G4733 Obstructive sleep apnea (adult) (pediatric): Secondary | ICD-10-CM | POA: Diagnosis not present

## 2015-10-19 ENCOUNTER — Other Ambulatory Visit: Payer: Self-pay | Admitting: Internal Medicine

## 2015-10-19 NOTE — Telephone Encounter (Signed)
**Note De-Identified Cupp Obfuscation** Done hardcopy to Heather/dahlia

## 2015-10-20 NOTE — Telephone Encounter (Signed)
**Note De-Identified Auguste Obfuscation** Duplicate. Rx faxed to pharmacy

## 2015-10-20 NOTE — Telephone Encounter (Signed)
**Note De-identified Szilagyi Obfuscation** Rx faxed to pharmacy  

## 2015-11-07 DIAGNOSIS — G4733 Obstructive sleep apnea (adult) (pediatric): Secondary | ICD-10-CM | POA: Diagnosis not present

## 2015-11-12 DIAGNOSIS — G4733 Obstructive sleep apnea (adult) (pediatric): Secondary | ICD-10-CM | POA: Diagnosis not present

## 2015-11-12 DIAGNOSIS — J449 Chronic obstructive pulmonary disease, unspecified: Secondary | ICD-10-CM | POA: Diagnosis not present

## 2015-11-12 DIAGNOSIS — T85511A Breakdown (mechanical) of esophageal anti-reflux device, initial encounter: Secondary | ICD-10-CM | POA: Diagnosis not present

## 2015-11-12 DIAGNOSIS — L400 Psoriasis vulgaris: Secondary | ICD-10-CM | POA: Diagnosis not present

## 2015-11-16 ENCOUNTER — Other Ambulatory Visit: Payer: Self-pay

## 2015-11-16 DIAGNOSIS — Z1231 Encounter for screening mammogram for malignant neoplasm of breast: Secondary | ICD-10-CM

## 2015-11-21 ENCOUNTER — Other Ambulatory Visit: Payer: Self-pay | Admitting: Internal Medicine

## 2015-12-01 ENCOUNTER — Ambulatory Visit
Admission: RE | Admit: 2015-12-01 | Discharge: 2015-12-01 | Disposition: A | Discharge status: Home / Self Care | Payer: Medicare Other | Source: Ambulatory Visit

## 2015-12-01 DIAGNOSIS — Z1231 Encounter for screening mammogram for malignant neoplasm of breast: Secondary | ICD-10-CM

## 2015-12-05 ENCOUNTER — Encounter: Payer: Self-pay | Admitting: Internal Medicine

## 2015-12-05 NOTE — Telephone Encounter (Signed)
**Note De-Identified Kirst Obfuscation** Please advise Dr. Annamaria Boots if okay to refill the chantix. thanks

## 2015-12-05 NOTE — Telephone Encounter (Signed)
**Note De-Identified Geeting Obfuscation** Ok to refill Chantix

## 2015-12-06 ENCOUNTER — Ambulatory Visit (INDEPENDENT_AMBULATORY_CARE_PROVIDER_SITE_OTHER): Payer: Medicare Other | Admitting: Internal Medicine

## 2015-12-06 ENCOUNTER — Encounter: Payer: Self-pay | Admitting: Internal Medicine

## 2015-12-06 ENCOUNTER — Other Ambulatory Visit (INDEPENDENT_AMBULATORY_CARE_PROVIDER_SITE_OTHER): Payer: Medicare Other

## 2015-12-06 VITALS — BP 140/74 | HR 108 | Temp 98.3°F | Resp 20 | Wt 263.0 lb

## 2015-12-06 DIAGNOSIS — I10 Essential (primary) hypertension: Secondary | ICD-10-CM | POA: Diagnosis not present

## 2015-12-06 DIAGNOSIS — R7989 Other specified abnormal findings of blood chemistry: Secondary | ICD-10-CM

## 2015-12-06 DIAGNOSIS — Z0000 Encounter for general adult medical examination without abnormal findings: Secondary | ICD-10-CM | POA: Diagnosis not present

## 2015-12-06 DIAGNOSIS — R7302 Impaired glucose tolerance (oral): Secondary | ICD-10-CM

## 2015-12-06 DIAGNOSIS — F341 Dysthymic disorder: Secondary | ICD-10-CM

## 2015-12-06 LAB — CBC WITH DIFFERENTIAL/PLATELET
BASOS PCT: 0.3 % (ref 0.0–3.0)
Basophils Absolute: 0 10*3/uL (ref 0.0–0.1)
EOS ABS: 0.2 10*3/uL (ref 0.0–0.7)
Eosinophils Relative: 2 % (ref 0.0–5.0)
HEMATOCRIT: 48.2 % — AB (ref 36.0–46.0)
Hemoglobin: 16.4 g/dL — ABNORMAL HIGH (ref 12.0–15.0)
LYMPHS ABS: 1.8 10*3/uL (ref 0.7–4.0)
LYMPHS PCT: 18.5 % (ref 12.0–46.0)
MCHC: 33.9 g/dL (ref 30.0–36.0)
MCV: 97.8 fl (ref 78.0–100.0)
Monocytes Absolute: 0.3 10*3/uL (ref 0.1–1.0)
Monocytes Relative: 3.5 % (ref 3.0–12.0)
NEUTROS ABS: 7.3 10*3/uL (ref 1.4–7.7)
Neutrophils Relative %: 75.7 % (ref 43.0–77.0)
PLATELETS: 190 10*3/uL (ref 150.0–400.0)
RBC: 4.92 Mil/uL (ref 3.87–5.11)
RDW: 15.5 % (ref 11.5–15.5)
WBC: 9.6 10*3/uL (ref 4.0–10.5)

## 2015-12-06 LAB — BASIC METABOLIC PANEL
BUN: 11 mg/dL (ref 6–23)
CHLORIDE: 97 meq/L (ref 96–112)
CO2: 28 mEq/L (ref 19–32)
CREATININE: 0.61 mg/dL (ref 0.40–1.20)
Calcium: 9.6 mg/dL (ref 8.4–10.5)
GFR: 105.69 mL/min (ref >60.00)
Glucose, Bld: 265 mg/dL — ABNORMAL HIGH (ref 70–99)
POTASSIUM: 4.2 meq/L (ref 3.5–5.1)
SODIUM: 141 meq/L (ref 135–145)

## 2015-12-06 LAB — LIPID PANEL
CHOL/HDL RATIO: 6
Cholesterol: 214 mg/dL — ABNORMAL HIGH (ref 0–200)
HDL: 35.3 mg/dL — AB (ref >39.00)
Triglycerides: 466 mg/dL — ABNORMAL HIGH (ref 0.0–149.0)

## 2015-12-06 LAB — HEPATIC FUNCTION PANEL
ALK PHOS: 140 U/L — AB (ref 39–117)
ALT: 25 U/L (ref 0–35)
AST: 22 U/L (ref 0–37)
Albumin: 4 g/dL (ref 3.5–5.2)
BILIRUBIN DIRECT: 0.1 mg/dL (ref 0.0–0.3)
BILIRUBIN TOTAL: 0.6 mg/dL (ref 0.2–1.2)
Total Protein: 7.6 g/dL (ref 6.0–8.3)

## 2015-12-06 LAB — URINALYSIS, ROUTINE W REFLEX MICROSCOPIC
Bilirubin Urine: NEGATIVE
Hgb urine dipstick: NEGATIVE
Ketones, ur: NEGATIVE
Leukocytes, UA: NEGATIVE
Nitrite: NEGATIVE
RBC / HPF: NONE SEEN (ref 0–?)
Specific Gravity, Urine: >=1.03 — AB (ref 1.000–1.030)
Total Protein, Urine: 100 — AB
URINE GLUCOSE: NEGATIVE
Urobilinogen, UA: 0.2 (ref 0.0–1.0)
WBC UA: NONE SEEN (ref 0–?)
pH: 5.5 (ref 5.0–8.0)

## 2015-12-06 LAB — LDL CHOLESTEROL, DIRECT: LDL DIRECT: 127 mg/dL

## 2015-12-06 LAB — HEMOGLOBIN A1C: HEMOGLOBIN A1C: 6.9 % — AB (ref 4.6–6.5)

## 2015-12-06 LAB — TSH: TSH: 1.25 u[IU]/mL (ref 0.35–4.50)

## 2015-12-06 MED ORDER — VARENICLINE TARTRATE 1 MG PO TABS: 1.0000 mg | ORAL_TABLET | Freq: Two times a day (BID) | ORAL | Status: DC

## 2015-12-06 NOTE — Assessment & Plan Note (Signed)
**Note De-Identified Rollinson Obfuscation** stable overall by history and exam, recent data reviewed with pt, and pt to continue medical treatment as before,  to f/u any worsening symptoms or concerns Lab Results  Component Value Date   WBC 10.0 08/26/2015   HGB 16.6* 08/26/2015   HCT 50.5* 08/26/2015   PLT 163 08/26/2015   GLUCOSE 161* 08/26/2015   CHOL 192 05/27/2015   TRIG 256.0* 05/27/2015   HDL 36.30* 05/27/2015   LDLDIRECT 128.0 05/27/2015   ALT 15 08/26/2015   AST 15 08/26/2015   NA 142 08/26/2015   K 4.3 08/26/2015   CL 97* 07/06/2015   CREATININE 0.7 08/26/2015   BUN 9.6 08/26/2015   CO2 34* 08/26/2015   TSH 1.00 11/26/2014   HGBA1C 5.9 05/27/2015

## 2015-12-06 NOTE — Progress Notes (Signed)
**Note De-identified Ebbert Obfuscation** Pre visit review using our clinic review tool, if applicable. No additional management support is needed unless otherwise documented below in the visit note. 

## 2015-12-06 NOTE — Assessment & Plan Note (Signed)
**Note De-Identified Person Obfuscation** stable overall by history and exam, recent data reviewed with pt, and pt to continue medical treatment as before,  to f/u any worsening symptoms or concerns Lab Results  Component Value Date   HGBA1C 5.9 05/27/2015

## 2015-12-06 NOTE — Assessment & Plan Note (Signed)
**Note De-identified Vicencio Obfuscation** Overall doing well, age appropriate education and counseling updated, referrals for preventative services and immunizations addressed, dietary and smoking counseling addressed, most recent labs reviewed.  I have personally reviewed and have noted:  1) the patient's medical and social history 2) The pt's use of alcohol, tobacco, and illicit drugs 3) The patient's current medications and supplements 4) Functional ability including ADL's, fall risk, home safety risk, hearing and visual impairment 5) Diet and physical activities 6) Evidence for depression or mood disorder 7) The patient's height, weight, and BMI have been recorded in the chart  I have made referrals, and provided counseling and education based on review of the above  

## 2015-12-06 NOTE — Progress Notes (Signed)
**Note De-Identified Lack Obfuscation** Subjective:    Patient ID: Hailey Hernandez, female    DOB: 12-12-1955, 60 y.o.   MRN: [id number]  HPI  Here for wellness and f/u;  Overall doing ok;  Pt denies Chest pain, worsening SOB, DOE, wheezing, orthopnea, PND, worsening LE edema, palpitations, dizziness or syncope.  Pt denies neurological change such as new headache, facial or extremity weakness.  Pt denies polydipsia, polyuria, or low sugar symptoms. Pt states overall good compliance with treatment and medications, good tolerability, and has been trying to follow appropriate diet.  Pt denies worsening depressive symptoms, suicidal ideation or panic. No fever, night sweats, wt loss, loss of appetite, or other constitutional symptoms.  Pt states good ability with ADL's, has low fall risk, home safety reviewed and adequate, no other significant changes in hearing or vision, and only occasionally active with exercise. No recent falls with ambulation, though break a toe in the past yr falling out of bed. Seein Heme for polycythemia, and Pulm on regular basis. Did stop smoking for 1 wk with chantix in feb,  But now back to smoking, but at least now < 1 ppd.  Psoriatic plaques some worse recently to extremities but o/w dont bother her.  Has chronic bilat LE mild swelling no change. Needs routine med all sent to mail in pharmacy Past Medical History  Diagnosis Date  . Tachyarrhythmia   . Tobacco abuse   . Chronic bronchitis   . History of thyroid cancer   . Rhinitis   . Polymyalgia ([place])   . Diverticulitis   . DJD (degenerative joint disease)     spine  . Arthritis   . COPD (chronic obstructive pulmonary disease) ([place])   . Hyperlipidemia   . GERD (gastroesophageal reflux disease)   . Fibromyalgia   . Arthritis     bil legs  . Asthma   . Glaucoma   . Allergy   . Anxiety disorder   . Depression   . Cancer ([place])   . Complication of anesthesia     20 yrs ago turned blue after surgery  . HTN (hypertension)     no longer on BP medication    . Shortness of breath   . OSA (obstructive sleep apnea)     CPAP  last sleep study 8-10 yr.ag0  . Psoriasis   . Impaired glucose tolerance 05/08/2013  . Atrial fibrillation [facility])    Past Surgical History  Procedure Laterality Date  . Total abdominal hysterectomy    . Cholecystectomy    . Appendectomy    . Carpal tunnel release    . Left shoulder spurs    . Orif right lower leg    . Cholesteatoma excision      right ear  . Carpal tunnel release  10/30/2011    Procedure: CARPAL TUNNEL RELEASE;  Surgeon: Wynonia Sours, MD;  Location: [facility];  Service: Orthopedics;  Laterality: Right;  and mass excision  . Partial throidectomy      reports that she has been smoking Cigarettes.  She has a 45 pack-year smoking history. She has never used smokeless tobacco. She reports that she does not drink alcohol or use illicit drugs. family history includes Allergies in her sister; Alpha-1 antitrypsin deficiency in her sister and sister; Asthma in her sister; Cancer in her brother, mother, and sister; Emphysema in her sister and sister; Heart attack in her father, mother, and sister; Heart disease in her father and mother; Hyperlipidemia in her brother, daughter, and **Note De-Identified Tigue Obfuscation** mother; Hypertension in her brother, daughter, mother, sister, and son; Tuberculosis in her other. Allergies  Allergen Reactions  . Doxycycline Shortness Of Breath and Other (See Comments)    Drug interaction with Soriatane  . Tramadol Shortness Of Breath    Did not work  . Milnacipran     REACTION: dizzy   Current Outpatient Prescriptions on File Prior to Visit  Medication Sig Dispense Refill  . albuterol (PROVENTIL) (2.5 MG/3ML) 0.083% nebulizer solution Take 3 mLs (2.5 mg total) by nebulization every 4 (four) hours as needed for wheezing or shortness of breath. DX: 496 180 vial 11  . atorvastatin (LIPITOR) 80 MG tablet Take 1 tablet (80 mg total) by mouth daily. 90 tablet 3  . Azelastine-Fluticasone (DYMISTA) 137-50  MCG/ACT SUSP Place 2 sprays into both nostrils at bedtime. (Patient taking differently: Place 2 sprays into both nostrils daily. ) 1 Bottle 5  . benzonatate (TESSALON) 100 MG capsule Take 1-2 capsules (100-200 mg total) by mouth 2 (two) times daily as needed for cough. 30 capsule 0  . budesonide-formoterol (SYMBICORT) 160-4.5 MCG/ACT inhaler Inhale 2 puffs into the lungs 2 (two) times daily. 1 Inhaler 2  . clotrimazole (LOTRIMIN) 1 % cream APPLY AS DIRECTED BY YOUR PHYSICIAN. -FOR TOPICAL USE- 30 g 0  . folic acid (FOLVITE) 1 MG tablet Take 1 mg by mouth daily.     . furosemide (LASIX) 40 MG tablet TAKE 1 TABLET DAILY (Patient taking differently: TAKE 1 TABLET DAILY prn) 90 tablet 3  . gabapentin (NEURONTIN) 300 MG capsule TAKE 1 TO 2 CAPSULES AT NIGHT AS NEEDED FOR PAIN 180 capsule 0  . LORazepam (ATIVAN) 0.5 MG tablet TAKE 1 TABLET 2 TIMES DAILY AS NEEDED FOR ANXIETY 60 tablet 3  . methotrexate (RHEUMATREX) 2.5 MG tablet 25 mg once a week. On wednesday Caution:Chemotherapy. Protect from light.    . montelukast (SINGULAIR) 10 MG tablet Take 1 tablet (10 mg total) by mouth daily. 90 tablet 3  . Nebulizers (COMPRESSOR/NEBULIZER) MISC Use up to 4 times daily when needed 1 each 0  . omeprazole (PRILOSEC) 20 MG capsule TAKE (1) CAPSULE DAILY 30 capsule 2  . potassium chloride (K-DUR) 10 MEQ tablet Take 10 mEq by mouth 2 (two) times daily as needed (along with Lasix.).    Marland Kitchen varenicline (CHANTIX CONTINUING MONTH PAK) 1 MG tablet Take 1 tablet (1 mg total) by mouth 2 (two) times daily. 60 tablet 1  . [DISCONTINUED] doxepin (SINEQUAN) 10 MG capsule 1-2 at bedtime     . [DISCONTINUED] fexofenadine (ALLEGRA) 180 MG tablet Take 180 mg by mouth daily.       No current facility-administered medications on file prior to visit.   Review of Systems Constitutional: Negative for increased diaphoresis, other activity, appetite or siginficant weight change other than noted HENT: Negative for worsening hearing  loss, ear pain, facial swelling, mouth sores and neck stiffness.   Eyes: Negative for other worsening pain, redness or visual disturbance.  Respiratory: Negative for shortness of breath and wheezing  Cardiovascular: Negative for chest pain and palpitations.  Gastrointestinal: Negative for diarrhea, blood in stool, abdominal distention or other pain Genitourinary: Negative for hematuria, flank pain or change in urine volume.  Musculoskeletal: Negative for myalgias or other joint complaints.  Skin: Negative for color change and wound or drainage.  Neurological: Negative for syncope and numbness. other than noted Hematological: Negative for adenopathy. or other swelling Psychiatric/Behavioral: Negative for hallucinations, SI, self-injury, decreased concentration or other worsening agitation. **Note De-Identified Yurkovich Obfuscation** Objective:   Physical Exam BP 140/74 mmHg  Pulse 108  Temp(Src) 98.3 F (36.8 C) (Oral)  Resp 20  Wt 263 lb (119.296 kg)  SpO2 91% on home o2 - 2L at rest, 3L with ambulation VS noted,  Constitutional: Pt is oriented to person, place, and time. Appears well-developed and well-nourished, in no significant distress Head: Normocephalic and atraumatic.  Right Ear: External ear normal.  Left Ear: External ear normal.  Nose: Nose normal.  Mouth/Throat: Oropharynx is clear and moist.  Eyes: Conjunctivae and EOM are normal. Pupils are equal, round, and reactive to light.  Neck: Normal range of motion. Neck supple. No JVD present. No tracheal deviation present or significant neck LA or mass Cardiovascular: Normal rate, regular rhythm, normal heart sounds and intact distal pulses.   Pulmonary/Chest: Effort normal and breath sounds decresed without rales or wheezing  Abdominal: Soft. Bowel sounds are normal. NT. No HSM  Musculoskeletal: Normal range of motion. Exhibits trace to 1+ bilat chronic LE edema.  Lymphadenopathy:  Has no cervical adenopathy.  Neurological: Pt is alert and oriented to person,  place, and time. Pt has normal reflexes. No cranial nerve deficit. Motor grossly intact Skin: Skin is warm and dry. + severe psoriatic plaque rash noted to extremities.  Psychiatric:  Has normal mood and affect. Behavior is normal.         Assessment & Plan:

## 2015-12-06 NOTE — Patient Instructions (Signed)
**Note De-identified Kiel Obfuscation** Please continue all other medications as before, and refills have been done if requested.  Please have the pharmacy call with any other refills you may need.  Please continue your efforts at being more active, low cholesterol diet, and weight control.  You are otherwise up to date with prevention measures today.  Please keep your appointments with your specialists as you may have planned  Please go to the LAB in the Basement (turn left off the elevator) for the tests to be done today  You will be contacted by phone if any changes need to be made immediately.  Otherwise, you will receive a letter about your results with an explanation, but please check with MyChart first.  Please remember to sign up for MyChart if you have not done so, as this will be important to you in the future with finding out test results, communicating by private email, and scheduling acute appointments online when needed.  Please return in 6 months, or sooner if needed 

## 2015-12-06 NOTE — Assessment & Plan Note (Signed)
**Note De-Identified Koury Obfuscation** stable overall by history and exam, recent data reviewed with pt, and pt to continue medical treatment as before,  to f/u any worsening symptoms or concerns BP Readings from Last 3 Encounters:  12/06/15 140/74  08/26/15 149/72  07/06/15 135/78

## 2015-12-06 NOTE — Addendum Note (Signed)
**Note De-Identified Piet Obfuscation** Addended by: Desmond Dike C on: 12/06/2015 08:11 AM   Modules accepted: Orders

## 2015-12-07 LAB — HEPATITIS C ANTIBODY: HCV AB: NEGATIVE

## 2015-12-08 ENCOUNTER — Ambulatory Visit: Payer: Self-pay | Admitting: Internal Medicine

## 2015-12-10 DIAGNOSIS — J449 Chronic obstructive pulmonary disease, unspecified: Secondary | ICD-10-CM | POA: Diagnosis not present

## 2015-12-10 DIAGNOSIS — T85511A Breakdown (mechanical) of esophageal anti-reflux device, initial encounter: Secondary | ICD-10-CM | POA: Diagnosis not present

## 2015-12-10 DIAGNOSIS — L400 Psoriasis vulgaris: Secondary | ICD-10-CM | POA: Diagnosis not present

## 2015-12-10 DIAGNOSIS — G4733 Obstructive sleep apnea (adult) (pediatric): Secondary | ICD-10-CM | POA: Diagnosis not present

## 2015-12-12 ENCOUNTER — Encounter: Payer: Self-pay | Admitting: Internal Medicine

## 2015-12-12 DIAGNOSIS — L409 Psoriasis, unspecified: Secondary | ICD-10-CM | POA: Diagnosis not present

## 2015-12-12 DIAGNOSIS — Z79899 Other long term (current) drug therapy: Secondary | ICD-10-CM | POA: Diagnosis not present

## 2015-12-12 DIAGNOSIS — Z5181 Encounter for therapeutic drug level monitoring: Secondary | ICD-10-CM | POA: Diagnosis not present

## 2015-12-17 ENCOUNTER — Other Ambulatory Visit: Payer: Self-pay | Admitting: Internal Medicine

## 2015-12-30 ENCOUNTER — Encounter: Payer: Self-pay | Admitting: Internal Medicine

## 2016-01-03 MED ORDER — FENOFIBRATE 160 MG PO TABS: 160.0000 mg | ORAL_TABLET | Freq: Every day | ORAL | Status: DC

## 2016-01-10 DIAGNOSIS — T85511A Breakdown (mechanical) of esophageal anti-reflux device, initial encounter: Secondary | ICD-10-CM | POA: Diagnosis not present

## 2016-01-10 DIAGNOSIS — G4733 Obstructive sleep apnea (adult) (pediatric): Secondary | ICD-10-CM | POA: Diagnosis not present

## 2016-01-10 DIAGNOSIS — L400 Psoriasis vulgaris: Secondary | ICD-10-CM | POA: Diagnosis not present

## 2016-01-10 DIAGNOSIS — J449 Chronic obstructive pulmonary disease, unspecified: Secondary | ICD-10-CM | POA: Diagnosis not present

## 2016-01-27 ENCOUNTER — Encounter: Payer: Self-pay | Admitting: Internal Medicine

## 2016-01-27 MED ORDER — PREDNISONE 10 MG PO TABS: 10.0000 mg | ORAL_TABLET | Freq: Every day | ORAL | Status: DC

## 2016-01-27 NOTE — Telephone Encounter (Signed)
**Note De-Identified Cloninger Obfuscation** Probably not a bacterial infection that antibiotic could help. Suggest- prednisone 10 mg, # 7, 1 daily

## 2016-01-27 NOTE — Telephone Encounter (Signed)
**Note De-identified Ennen Obfuscation** CY - please advise. Thanks! 

## 2016-01-27 NOTE — Addendum Note (Signed)
**Note De-Identified Bergh Obfuscation** Addended by: Desmond Dike C on: 01/27/2016 04:41 PM   Modules accepted: Orders

## 2016-01-27 NOTE — Telephone Encounter (Signed)
**Note De-Identified Bassett Obfuscation** Incomplete response below. I suggested Rx prednisone 10 mg, # 7, 1 daily                                                                   OTC claritin antihistamine, 1 daily                                                                   OTC cough syrup like Delsym

## 2016-01-30 ENCOUNTER — Ambulatory Visit (INDEPENDENT_AMBULATORY_CARE_PROVIDER_SITE_OTHER): Payer: Medicare Other | Admitting: Internal Medicine

## 2016-01-30 ENCOUNTER — Encounter: Payer: Self-pay | Admitting: Internal Medicine

## 2016-01-30 VITALS — BP 122/72 | HR 112 | Ht 64.0 in | Wt 257.4 lb

## 2016-01-30 DIAGNOSIS — J441 Chronic obstructive pulmonary disease with (acute) exacerbation: Secondary | ICD-10-CM

## 2016-01-30 MED ORDER — LEVALBUTEROL HCL 0.63 MG/3ML IN NEBU
0.6300 mg | INHALATION_SOLUTION | Freq: Once | RESPIRATORY_TRACT | Status: AC
  Administered 2016-01-30: 0.63 mg via RESPIRATORY_TRACT

## 2016-01-30 MED ORDER — AMOXICILLIN-POT CLAVULANATE 875-125 MG PO TABS: ORAL_TABLET | ORAL | Status: DC

## 2016-01-30 MED ORDER — VARENICLINE TARTRATE 0.5 MG X 11 & 1 MG X 42 PO MISC: ORAL | Status: DC

## 2016-01-30 MED ORDER — PREDNISONE 10 MG PO TABS: ORAL_TABLET | ORAL | Status: DC

## 2016-01-30 MED ORDER — METHYLPREDNISOLONE ACETATE 80 MG/ML IJ SUSP
80.0000 mg | Freq: Once | INTRAMUSCULAR | Status: AC
  Administered 2016-01-30: 80 mg via INTRAMUSCULAR

## 2016-01-30 NOTE — Progress Notes (Signed)
**Note De-Identified Karwowski Obfuscation** Patient ID: Hailey Hernandez, female    DOB: 1956/03/07, 60 y.o.   MRN: [id number]  HPI 42 female with known hx of chronic bronchitis , OSA/OHS , active smoker   02/05/11- 55 yoFwith chronic bronchitis, tobacco use, OSA, obesity hypoventilation syndrome. Last here December 15, 2010. Since then has continued Symbicort. Comes with sister today. Sleeping 2 nights ago- woke smothered. Had to pull off CPAP. She gradually cleared over several minutes. Not clear if she refluxed, had mucus plug or ??. Today is typical with no residual. Chest congestion varies day to day. Says cough better, not coughing up anything purulent or bloody. Still averaging 1.5 PPD, down from 3 PPD.  Compliant with CPAP- needs new mask.   07/12/2011 Acute OV  Presents for an acute office visit. Complains pain in right side x 3 days--more painful with cough and movement.  cough with green sputum--. She has been sick for 2 weeks, cough, congestion , drainage. Was exposed to URI symptoms at hospital . Started with lots of drippy nose and drainge.  No fever. Initially phoned in a zpack with no improvement. Then started on Omnicef 4 days ago.  Does have some wheezing . Now pain with inspiration esp along right lateral ribs. Tender to touch and with movement. No rash or fever.   08/10/11- 24 yoFwith chronic bronchitis, tobacco use, OSA, obesity hypoventilation syndrome. Several family members here.Marland Kitchen PCP Hailey Hernandez She describes persistent ear ache on the right, nasal discharge from the left with little bit of bloody mucus. Distal coughing productively with light yellow sputum, no blood. Questions low-grade fever mainly because she sometimes feels chilly. Smoking is down from 3 packs a day to a little under one pack per day. I encouraged her to keep working at. She gives history of a cholesteatoma and told never to put water in her right ear.  12/10/11- 11 yoFwith chronic bronchitis, tobacco use, OSA, obesity hypoventilation syndrome.  Sister  here. Her sister Hailey Hernandez died in her sleep of end-stage COPD complicated by alpha-1 antitrypsin deficiency. We had called in Augmentin which did help some. Complains now of sinus pressure, blowing bloody scabs from her nose, postnasal drip, cough productive of thick mucus. Denies fever, swollen glands, headache. She has decided that she finally wants to quit smoking. Has reduced from 3 packs per day habit to one pack per day. We discussed support, medical reasons and available techniques and answered her questions.  05/15/12-55 yoFwith chronic bronchitis, tobacco use, OSA, obesity hypoventilation syndrome.  Sister here.  Pt states breathing has been "okay" since last seen. Pt c/o increased SOB and allergy trouble with HA . Pt denies prod cough, wheezing and chest tightness.. unhappy with Hailey Hernandez and wants to change to Hailey Hernandez. CPAP 13/ O2 2L/ [facility] [employer] Parking requested  COPD assessment test (CAT) score 14/40 CXR 07/12/11- IMPRESSION:  Bronchitic changes. Left base atelectasis.  Original Report Authenticated By: Raelyn Number, M.D.   09/15/12- 56 yoFwith chronic bronchitis, tobacco use, OSA, obesity hypoventilation syndrome.  Sister here. Follows for: pt states last couple of weeks increase sob,productive cough.chest tightness.denies any wheezing.  For 2 weeks her eyes had been itching and she has had frontal headache, watery rhinorrhea and a little bit of epistaxis. Scratchy throat. Questions low-grade fever. Treating with antihistamines. She continues oxygen 2-3 L per minute/ Hailey Hernandez. Continues good compliance with CPAP 13+ oxygen 2 L at night. She is making no effort to stop smoking despite our counseling. CXR 05/15/12-reviewed IMPRESSION:  Stable **Note De-Identified Nosbisch Obfuscation** bronchitic changes and interstitial prominence.  Lingular scarring or atelectasis.  Borderline cardiomegaly.  Original Report Authenticated By: Raelyn Number, M.D.   01/30/13-  56 yoFwith chronic bronchitis, tobacco use, OSA,  obesity hypoventilation syndrome.  Sister here. FOLLOWS FOR: states she is having increased SOB Home oxygen Hailey Hernandez using 3 L at home, 2 L portable. CPAP 13+ oxygen 2 L for sleep Expresses interest in quitting smoking and asks for Chantix which we discussed. Productive cough with some chest congestion, white or yellow sputum. No blood, no chest pain. New problem-3 weeks, aching right upper tibia anteriorly keeps her awake at hurts to bear weight. CXR 11/07/12 IMPRESSION:  Stable chest x-ray with cardiomegaly. No active lung disease.  Original Report Authenticated By: Ivar Drape, M.D.  06/05/13- 57 yoFwith chronic bronchitis, tobacco use, OSA, obesity hypoventilation syndrome.  Sister here. FOLLOWS FOR: Breathing unchanged since last OV.  Needs to discuss oxygen today Home oxygen  Hailey Hernandez using 3 L at home, 2 L portable. CPAP 13+ oxygen 2 L for sleep Anxious about trying Chantix, but decided to try it- discussed. Failed Wellbutrin.  10/09/13- 60 yoFwith chronic bronchitis, tobacco use (1 ppd/ 45 pk yrs), OSA, obesity hypoventilation syndrome.  Sister here. FOLLOWS FOR:  Still having sob, cough (non-productive) and wheezing.  Also, headaches and facial swelling, painfull  to the touch. CPAP 13+ oxygen 2 L for sleep/ Hailey Hernandez She looked into a portable concentrator but home care can't provide it with her insurance. Upper respiratory infection/sinusitis over Christmas, treated with Keflex which helped but she still has nasal congestion and blows small amounts of blood from her nose without headache or fever.  02/05/14-  83 yoF smoker with chronic bronchitis, tobacco use (1 ppd/ 45 pk yrs), OSA, obesity hypoventilation syndrome. Complicated by psoriasis             Grand daughter here. FOLLOWS FOR:  Reports breathing unchanged since last OV.  Wearing CPAP 13 + O2 2L/ Hailey Hernandez  Daily cough productive white sputum. Nothing bloody or purulent. She continues to smoke and gave  up on Chantix  07/23/14- 58 yoF smoker with chronic bronchitis/ COPD, tobacco use (1 ppd/ 45 pk yrs), OSA, obesity hypoventilation syndrome. Complicated by psoriasis             Grand daughter here. FOLLOW FOR: COPD;  CXR 02/05/14 IMPRESSION:  No segmental infiltrate or pulmonary edema. Slight worsening central  bronchitic changes. Stable chronic interstitial prominence.  Electronically Signed  By: Lahoma Crocker M.D.  On: 02/05/2014 13:31  07/23/14- 21 yoF smoker with chronic bronchitis/ COPD, tobacco use (1 ppd/ 45 pk yrs), OSA, obesity hypoventilation syndrome. Complicated by psoriasis             Sister here. FOLLOW FOR: COPD; SOB; wheezing Nasal drainage, cough prod white.  On methotrexate and Cosentyx for psoriasis Avoids lasix- makes gout flare up. Admits fluid retention. Continues CPAP 13/ O2 2L. Wants to change back to Payne from Hailey Hernandez for better service.  12/03/14- 14 yoF smoker with chronic bronchitis/ COPD, tobacco use (1 ppd/ 45 pk yrs), OSA, obesity hypoventilation syndrome. Complicated by psoriasis             Sister here FOLLOWS LNL:[id number] to wear  CPAP 13/O2 2L/ Hailey.  Has not been able to stop smoking and I don't think she is really trying despite our counseling efforts. Says her breathing is "okay". Blames allergy for burning and itching of eyes and nose over the last 2 months. Her **Note De-Identified Schrupp Obfuscation** primary physician gave Singulair and Depo-Medrol 1 week ago. She is using Flonase. She questions whether symptoms actually are reaction to her psoriasis medication Cosentyx. Review of Epocrates does suggest this may be the case.   06/10/15- 56 yoF smoker with chronic bronchitis/ COPD, tobacco use (1 ppd/ 45 pk yrs), OSA, obesity hypoventilation syndrome. Complicated by psoriasis             Sister here FOLLOWS FOR: Pt states her SOB is at baseline. Pt c/o prod cough with clear mucus, watery eyes, rhinorrhea, sinus pressure x 2 weeks. Pt was given a zpak and pred by PCP and the s/s  improved but have now worsened. Pt denies CP/tightness and f/c/s.  Increased cough head and chest congestion, probably for a month. Transient benefit after Z-Pak and prednisone. She routinely takes Zyrtec and Singulair daily, Flonase. Continues CPAP 13 with oxygen 2 L/Hailey Being assessed for polycythemia. Carboxyhemoglobin was elevated consistent with COPD and smoking. Home has electrical heat. Discussed smoking again. Insurance won't pay for Chantix. She is willing to try nicotine patch. CXR 12/03/14 IMPRESSION: No active cardiopulmonary disease. Stable COPD. Electronically Signed  By: Lahoma Crocker M.D.  On: 12/03/2014 13:02  01/30/2016-60 year old female smoker with chronic bronchitis/COPD, tobacco use (1 PPD/45 pack years), OSA, obesity hypoventilation syndrome, complicated by psoriasis ACUTE VISIT: Pt states her allergies have caused her to get congested and hard to keep O2 levels up-had to increase O2. Continues to stay SOB and fatigued as well.  CXR 07/06/2015 IMPRESSION: 1. Stable chest x-ray with diffuse bronchitic change and mild interstitial prominence but no evidence of acute cardiopulmonary process. 2. Stable cardiomegaly. 3. Aortic atherosclerosis. Electronically Signed  By: Jacqulynn Cadet M.D.  On: 07/06/2015 13:56  Review of Systems-see HPI Constitutional:   No-   weight loss, night sweats, fevers, chills, fatigue, lassitude. HEENT:    headaches, difficulty swallowing, tooth/dental problems, sore throat,       + sneezing,+ itching,  ear ache,  +nasal congestion, +post nasal drip,  CV:  No-   chest pain, orthopnea, PND, +swelling in lower extremities, anasarca,   dizziness, palpitations Resp: +  shortness of breath with exertion or at rest.              + productive cough,  + non-productive cough,  No- coughing up of blood.              No-   change in color of mucus.  + wheezing.   Skin: psoriasis GI:  No-   heartburn, indigestion, abdominal pain, nausea,  vomiting,  GU: No-    MS:  + joint pain or swelling. . Neuro-     nothing unusual Psych:  No- change in mood or affect. No depression or anxiety.  No memory loss.   Objective:   Physical Exam General- Alert, Oriented, Affect-appropriate, Distress- none acute, obese. On 2L O2 sat 93% Skin- +extensive psoriasis plaques Lymphadenopathy- none Head- atraumatic            Eyes- Gross vision intact, PERRLA, conjunctivae clear secretions            Ears- Hearing, canals- cerumen/ debris R            Nose- + turbinate edema, no-Septal dev, mucus, polyps, erosion, perforation             Throat- Mallampati III , mucosa clear , drainage- none, tonsils- atrophic.                        + **Note De-Identified Mcginn Obfuscation** dentures Neck- flexible , trachea midline, no stridor , thyroid nl, carotid no bruit Chest - symmetrical excursion , unlabored           Heart/CV- RRR , no murmur , no gallop  , no rub, nl s1 s2                           - JVD- none , edema- none, stasis dermatitis changes+ bilateral, varices- none           Lung- loose rhonchi +, dullness-none, rub- none, + shallow tachypnea           Chest wall-  Abd-  Br/ Gen/ Rectal- Not done, not indicated Extrem- +apparent lipoma right medial knee with stasis changes Neuro- grossly intact to observation

## 2016-01-30 NOTE — Patient Instructions (Addendum)
**Note De-Identified Wisher Obfuscation** Neb xop 0.63       COPD exacerbation       May have neb mask/hose  Depo 45  Ok to increase O2 to 4 when needed  Script sent for prednisone taper  Script sent for augmentin antibiotic to take if needed  Script printed for Chantix  Please do everything you can to stop smoking.

## 2016-02-01 ENCOUNTER — Other Ambulatory Visit: Payer: Self-pay | Admitting: Internal Medicine

## 2016-02-09 DIAGNOSIS — J449 Chronic obstructive pulmonary disease, unspecified: Secondary | ICD-10-CM | POA: Diagnosis not present

## 2016-02-09 DIAGNOSIS — T85511A Breakdown (mechanical) of esophageal anti-reflux device, initial encounter: Secondary | ICD-10-CM | POA: Diagnosis not present

## 2016-02-09 DIAGNOSIS — L400 Psoriasis vulgaris: Secondary | ICD-10-CM | POA: Diagnosis not present

## 2016-02-09 DIAGNOSIS — G4733 Obstructive sleep apnea (adult) (pediatric): Secondary | ICD-10-CM | POA: Diagnosis not present

## 2016-02-14 ENCOUNTER — Encounter: Payer: Self-pay | Admitting: Internal Medicine

## 2016-02-14 DIAGNOSIS — R809 Proteinuria, unspecified: Secondary | ICD-10-CM

## 2016-02-20 ENCOUNTER — Other Ambulatory Visit: Payer: Self-pay | Admitting: Internal Medicine

## 2016-02-20 DIAGNOSIS — Z79899 Other long term (current) drug therapy: Secondary | ICD-10-CM | POA: Diagnosis not present

## 2016-02-20 DIAGNOSIS — L409 Psoriasis, unspecified: Secondary | ICD-10-CM | POA: Diagnosis not present

## 2016-02-21 NOTE — Telephone Encounter (Signed)
**Note De-identified Kruck Obfuscation** Please advise 

## 2016-02-21 NOTE — Telephone Encounter (Signed)
**Note De-identified Drollinger Obfuscation** Done hardcopy to Corinne  

## 2016-02-21 NOTE — Telephone Encounter (Signed)
**Note De-identified Bobeck Obfuscation** Medication sent to pharmacy  

## 2016-03-01 ENCOUNTER — Telehealth: Payer: Self-pay | Admitting: Internal Medicine

## 2016-03-01 DIAGNOSIS — R809 Proteinuria, unspecified: Secondary | ICD-10-CM

## 2016-03-01 NOTE — Telephone Encounter (Signed)
**Note De-Identified Roots Obfuscation** Stacy from [facility] called regarding the referral for pt. They are asking you to check pts urine protein creatinine ratio to see if they need to get her in sooner.

## 2016-03-01 NOTE — Telephone Encounter (Signed)
**Note De-identified Norling Obfuscation** Please advise 

## 2016-03-01 NOTE — Telephone Encounter (Signed)
**Note De-identified Bierlein Obfuscation** Patient aware.

## 2016-03-01 NOTE — Telephone Encounter (Signed)
**Note De-Identified Callanan Obfuscation** Order done  Please let pt know to have this done, and forward result to [facility]

## 2016-03-02 ENCOUNTER — Other Ambulatory Visit: Payer: Medicare Other

## 2016-03-02 ENCOUNTER — Telehealth: Payer: Self-pay

## 2016-03-02 DIAGNOSIS — R300 Dysuria: Secondary | ICD-10-CM | POA: Diagnosis not present

## 2016-03-02 LAB — MICROALBUMIN / CREATININE URINE RATIO
CREATININE, U: 46.2 mg/dL
MICROALB UR: 51.5 mg/dL — AB (ref 0.0–1.9)
Microalb Creat Ratio: 111.4 mg/g — ABNORMAL HIGH (ref 0.0–30.0)

## 2016-03-02 NOTE — Telephone Encounter (Signed)
**Note De-identified Pincus Obfuscation** New order placed

## 2016-03-09 ENCOUNTER — Ambulatory Visit: Payer: Medicare Other | Admitting: Internal Medicine

## 2016-03-09 DIAGNOSIS — S93402A Sprain of unspecified ligament of left ankle, initial encounter: Secondary | ICD-10-CM | POA: Diagnosis not present

## 2016-03-09 DIAGNOSIS — M7989 Other specified soft tissue disorders: Secondary | ICD-10-CM | POA: Diagnosis not present

## 2016-03-09 DIAGNOSIS — S99912A Unspecified injury of left ankle, initial encounter: Secondary | ICD-10-CM | POA: Diagnosis not present

## 2016-03-09 DIAGNOSIS — M25572 Pain in left ankle and joints of left foot: Secondary | ICD-10-CM | POA: Diagnosis not present

## 2016-03-10 DIAGNOSIS — G4733 Obstructive sleep apnea (adult) (pediatric): Secondary | ICD-10-CM | POA: Diagnosis not present

## 2016-03-11 DIAGNOSIS — G4733 Obstructive sleep apnea (adult) (pediatric): Secondary | ICD-10-CM | POA: Diagnosis not present

## 2016-03-11 DIAGNOSIS — T85511A Breakdown (mechanical) of esophageal anti-reflux device, initial encounter: Secondary | ICD-10-CM | POA: Diagnosis not present

## 2016-03-11 DIAGNOSIS — J449 Chronic obstructive pulmonary disease, unspecified: Secondary | ICD-10-CM | POA: Diagnosis not present

## 2016-03-11 DIAGNOSIS — L400 Psoriasis vulgaris: Secondary | ICD-10-CM | POA: Diagnosis not present

## 2016-03-14 ENCOUNTER — Encounter: Payer: Self-pay | Admitting: Internal Medicine

## 2016-03-15 MED ORDER — PREDNISONE 10 MG PO TABS: 10.0000 mg | ORAL_TABLET | Freq: Every day | ORAL | Status: DC

## 2016-03-15 NOTE — Telephone Encounter (Signed)
**Note De-Identified Guilliams Obfuscation** Ok for prednisone as prescribed

## 2016-03-19 ENCOUNTER — Encounter: Payer: Self-pay | Admitting: Internal Medicine

## 2016-03-19 DIAGNOSIS — R809 Proteinuria, unspecified: Secondary | ICD-10-CM

## 2016-03-19 NOTE — Telephone Encounter (Signed)
**Note De-Identified Scalia Obfuscation** Staff to check with lab regarding any result yet for the urine prot/creatinine test?

## 2016-03-20 NOTE — Telephone Encounter (Signed)
**Note De-Identified Dobek Obfuscation** PCP - will you pls reenter as "future"

## 2016-03-21 ENCOUNTER — Telehealth: Payer: Self-pay

## 2016-03-21 ENCOUNTER — Encounter: Payer: Self-pay | Admitting: Internal Medicine

## 2016-03-21 ENCOUNTER — Other Ambulatory Visit (INDEPENDENT_AMBULATORY_CARE_PROVIDER_SITE_OTHER): Payer: Medicare Other

## 2016-03-21 ENCOUNTER — Ambulatory Visit (INDEPENDENT_AMBULATORY_CARE_PROVIDER_SITE_OTHER): Payer: Medicare Other | Admitting: Internal Medicine

## 2016-03-21 VITALS — BP 118/68 | HR 114 | Ht 64.0 in | Wt 249.0 lb

## 2016-03-21 DIAGNOSIS — G4733 Obstructive sleep apnea (adult) (pediatric): Secondary | ICD-10-CM | POA: Diagnosis not present

## 2016-03-21 DIAGNOSIS — J449 Chronic obstructive pulmonary disease, unspecified: Secondary | ICD-10-CM

## 2016-03-21 DIAGNOSIS — Z0000 Encounter for general adult medical examination without abnormal findings: Secondary | ICD-10-CM | POA: Diagnosis not present

## 2016-03-21 DIAGNOSIS — F17200 Nicotine dependence, unspecified, uncomplicated: Secondary | ICD-10-CM | POA: Diagnosis not present

## 2016-03-21 DIAGNOSIS — R809 Proteinuria, unspecified: Secondary | ICD-10-CM | POA: Diagnosis not present

## 2016-03-21 DIAGNOSIS — L400 Psoriasis vulgaris: Secondary | ICD-10-CM | POA: Diagnosis not present

## 2016-03-21 LAB — MICROALBUMIN / CREATININE URINE RATIO
CREATININE, U: 50.5 mg/dL
MICROALB/CREAT RATIO: 102.5 mg/g — AB (ref 0.0–30.0)
Microalb, Ur: 51.8 mg/dL — ABNORMAL HIGH (ref 0.0–1.9)

## 2016-03-21 MED ORDER — GLYCOPYRROLATE-FORMOTEROL 9-4.8 MCG/ACT IN AERO: 2.0000 | INHALATION_SPRAY | Freq: Two times a day (BID) | RESPIRATORY_TRACT | Status: DC

## 2016-03-21 NOTE — Progress Notes (Signed)
**Note De-Identified Polinski Obfuscation** Patient ID: Hailey Hernandez, female   DOB: 30-May-1956, 60 y.o.   MRN: [id number]   Patient seen in the office today and instructed on use of Bevespi.  Patient expressed understanding and demonstrated technique.

## 2016-03-21 NOTE — Assessment & Plan Note (Signed)
**Note De-Identified Warga Obfuscation** Husband confirms her report good CPAP compliance with no breakthrough snoring. Pressure is been comfortable. She continues to bleed in oxygen.

## 2016-03-21 NOTE — Patient Instructions (Signed)
**Note De-Identified Lucarelli Obfuscation** Sample Bevespi maintenance inhaler     Inhale 2 puffs, twice daily   Try this instead of Symbicort. When you run out of the sample, go back to Symbicort for comparison.  Please stop smoking- That would really help your breathing !!

## 2016-03-21 NOTE — Assessment & Plan Note (Signed)
**Note De-Identified Granade Obfuscation** We continue study pressure on her to make a serious cessation effort. Odor of tobacco and exam room today I think from both her and her husband.

## 2016-03-21 NOTE — Assessment & Plan Note (Addendum)
**Note De-Identified Boettger Obfuscation** No recent exacerbation. Hopefully she will stay well through the summer. Not clear that an inhaled cortisone is helpful to her now. Plan-try changing maintenance inhaler to sample [employer]

## 2016-03-21 NOTE — Assessment & Plan Note (Signed)
**Note De-Identified Rudd Obfuscation** Now on new product [place] Nail

## 2016-03-21 NOTE — Progress Notes (Signed)
**Note De-Identified Karwowski Obfuscation** Patient ID: Hailey Hernandez, female    DOB: 1956/03/07, 60 y.o.   MRN: [id number]  HPI 42 female with known hx of chronic bronchitis , OSA/OHS , active smoker   02/05/11- 55 yoFwith chronic bronchitis, tobacco use, OSA, obesity hypoventilation syndrome. Last here December 15, 2010. Since then has continued Symbicort. Comes with sister today. Sleeping 2 nights ago- woke smothered. Had to pull off CPAP. She gradually cleared over several minutes. Not clear if she refluxed, had mucus plug or ??. Today is typical with no residual. Chest congestion varies day to day. Says cough better, not coughing up anything purulent or bloody. Still averaging 1.5 PPD, down from 3 PPD.  Compliant with CPAP- needs new mask.   07/12/2011 Acute OV  Presents for an acute office visit. Complains pain in right side x 3 days--more painful with cough and movement.  cough with green sputum--. She has been sick for 2 weeks, cough, congestion , drainage. Was exposed to URI symptoms at hospital . Started with lots of drippy nose and drainge.  No fever. Initially phoned in a zpack with no improvement. Then started on Omnicef 4 days ago.  Does have some wheezing . Now pain with inspiration esp along right lateral ribs. Tender to touch and with movement. No rash or fever.   08/10/11- 24 yoFwith chronic bronchitis, tobacco use, OSA, obesity hypoventilation syndrome. Several family members here.[facility] Kitchen PCP Dr Edrick Oh She describes persistent ear ache on the right, nasal discharge from the left with little bit of bloody mucus. Distal coughing productively with light yellow sputum, no blood. Questions low-grade fever mainly because she sometimes feels chilly. Smoking is down from 3 packs a day to a little under one pack per day. I encouraged her to keep working at. She gives history of a cholesteatoma and told never to put water in her right ear.  12/10/11- 11 yoFwith chronic bronchitis, tobacco use, OSA, obesity hypoventilation syndrome.  Sister  here. Her sister Hailey Hernandez died in her sleep of end-stage COPD complicated by alpha-1 antitrypsin deficiency. We had called in Augmentin which did help some. Complains now of sinus pressure, blowing bloody scabs from her nose, postnasal drip, cough productive of thick mucus. Denies fever, swollen glands, headache. She has decided that she finally wants to quit smoking. Has reduced from 3 packs per day habit to one pack per day. We discussed support, medical reasons and available techniques and answered her questions.  05/15/12-55 yoFwith chronic bronchitis, tobacco use, OSA, obesity hypoventilation syndrome.  Sister here.  Pt states breathing has been "okay" since last seen. Pt c/o increased SOB and allergy trouble with HA . Pt denies prod cough, wheezing and chest tightness.. unhappy with [name] and wants to change to [name]. CPAP 13/ O2 2L/ [facility] Parking requested  COPD assessment test (CAT) score 14/40 CXR 07/12/11- IMPRESSION:  Bronchitic changes. Left base atelectasis.  Original Report Authenticated By: Raelyn Number, M.D.   09/15/12- 56 yoFwith chronic bronchitis, tobacco use, OSA, obesity hypoventilation syndrome.  Sister here. Follows for: pt states last couple of weeks increase sob,productive cough.chest tightness.denies any wheezing.  For 2 weeks her eyes had been itching and she has had frontal headache, watery rhinorrhea and a little bit of epistaxis. Scratchy throat. Questions low-grade fever. Treating with antihistamines. She continues oxygen 2-3 L per minute/ Apothe[facility]ary. Continues good compliance with CPAP 13+ oxygen 2 L at night. She is making no effort to stop smoking despite our counseling. CXR 05/15/12-reviewed IMPRESSION:  Stable **Note De-Identified Nosbisch Obfuscation** bronchitic changes and interstitial prominence.  Lingular scarring or atelectasis.  Borderline cardiomegaly.  Original Report Authenticated By: Raelyn Number, M.D.   01/30/13-  56 yoFwith chronic bronchitis, tobacco use, OSA,  obesity hypoventilation syndrome.  Sister here. FOLLOWS FOR: states she is having increased SOB Home oxygen [facility] using 3 L at home, 2 L portable. CPAP 13+ oxygen 2 L for sleep Expresses interest in quitting smoking and asks for Chantix which we discussed. Productive cough with some chest congestion, white or yellow sputum. No blood, no chest pain. New problem-3 weeks, aching right upper tibia anteriorly keeps her awake at hurts to bear weight. CXR 11/07/12 IMPRESSION:  Stable chest x-ray with cardiomegaly. No active lung disease.  Original Report Authenticated By: Ivar Drape, M.D.  06/05/13- 57 yoFwith chronic bronchitis, tobacco use, OSA, obesity hypoventilation syndrome.  Sister here. FOLLOWS FOR: Breathing unchanged since last OV.  Needs to discuss oxygen today Home oxygen  Apothe[facility]ary using 3 L at home, 2 L portable. CPAP 13+ oxygen 2 L for sleep Anxious about trying Chantix, but decided to try it- discussed. Failed Wellbutrin.  10/09/13- 60 yoFwith chronic bronchitis, tobacco use (1 ppd/ 45 pk yrs), OSA, obesity hypoventilation syndrome.  Sister here. FOLLOWS FOR:  Still having sob, cough (non-productive) and wheezing.  Also, headaches and facial swelling, painfull  to the touch. CPAP 13+ oxygen 2 L for sleep/ [facility] She looked into a portable concentrator but home care can't provide it with her insurance. Upper respiratory infection/sinusitis over Christmas, treated with Keflex which helped but she still has nasal congestion and blows small amounts of blood from her nose without headache or fever.  02/05/14-  83 yoF smoker with chronic bronchitis, tobacco use (1 ppd/ 45 pk yrs), OSA, obesity hypoventilation syndrome. Complicated by psoriasis             Grand daughter here. FOLLOWS FOR:  Reports breathing unchanged since last OV.  Wearing CPAP 13 + O2 2L/ [place] [facility]  Daily cough productive white sputum. Nothing bloody or purulent. She continues to smoke and gave  up on Chantix  07/23/14- 58 yoF smoker with chronic bronchitis/ COPD, tobacco use (1 ppd/ 45 pk yrs), OSA, obesity hypoventilation syndrome. Complicated by psoriasis             Grand daughter here. FOLLOW FOR: COPD;  CXR 02/05/14 IMPRESSION:  No segmental infiltrate or pulmonary edema. Slight worsening central  bronchitic changes. Stable chronic interstitial prominence.  Electronically Signed  By: Lahoma Crocker M.D.  On: 02/05/2014 13:31  07/23/14- 21 yoF smoker with chronic bronchitis/ COPD, tobacco use (1 ppd/ 45 pk yrs), OSA, obesity hypoventilation syndrome. Complicated by psoriasis             Sister here. FOLLOW FOR: COPD; SOB; wheezing Nasal drainage, cough prod white.  On methotrexate and Cosentyx for psoriasis Avoids lasix- makes gout flare up. Admits fluid retention. Continues CPAP 13/ O2 2L. Wants to change back to [place] from [name] for better service.  12/03/14- 14 yoF smoker with chronic bronchitis/ COPD, tobacco use (1 ppd/ 45 pk yrs), OSA, obesity hypoventilation syndrome. Complicated by psoriasis             Sister here FOLLOWS LNL:[id number] to wear  CPAP 13/O2 2L/ [facility].  Has not been able to stop smoking and I don't think she is really trying despite our counseling efforts. Says her breathing is "okay". Blames allergy for burning and itching of eyes and nose over the last 2 months. Her **Note De-Identified Nida Obfuscation** primary physician gave Singulair and Depo-Medrol 1 week ago. She is using Flonase. She questions whether symptoms actually are reaction to her psoriasis medication Cosentyx. Review of Epocrates does suggest this may be the case.   06/10/15- 50 yoF smoker with chronic bronchitis/ COPD, tobacco use (1 ppd/ 45 pk yrs), OSA, obesity hypoventilation syndrome. Complicated by psoriasis             Sister here FOLLOWS FOR: Pt states her SOB is at baseline. Pt c/o prod cough with clear mucus, watery eyes, rhinorrhea, sinus pressure x 2 weeks. Pt was given a zpak and pred by PCP and the s/s  improved but have now worsened. Pt denies CP/tightness and f/c/s.  Increased cough head and chest congestion, probably for a month. Transient benefit after Z-Pak and prednisone. She routinely takes Zyrtec and Singulair daily, Flonase. Continues CPAP 13 with oxygen 2 L/[facility] Being assessed for polycythemia. Carboxyhemoglobin was elevated consistent with COPD and smoking. Home has electrical heat. Discussed smoking again. Insurance won't pay for Chantix. She is willing to try nicotine patch. CXR 12/03/14 IMPRESSION: No active cardiopulmonary disease. Stable COPD. Electronically Signed  By: Lahoma Crocker M.D.  On: 12/03/2014 13:02  01/30/2016-60 year old female smoker with chronic bronchitis/COPD, tobacco use (1 PPD/45 pack years), OSA, obesity hypoventilation syndrome, complicated by psoriasis ACUTE VISIT: Pt states her allergies have caused her to get congested and hard to keep O2 levels up-had to increase O2. Continues to stay SOB and fatigued as well.  CXR 07/06/2015 IMPRESSION: 1. Stable chest x-ray with diffuse bronchitic change and mild interstitial prominence but no evidence of acute cardiopulmonary process. 2. Stable cardiomegaly. 3. Aortic atherosclerosis. Electronically Signed  By: Jacqulynn Cadet M.D.  On: 07/06/2015 13:56  03/21/2016-60 year old female smoker followed for chronic bronchitis/COPD, chronic hypoxic respiratory failure, tobacco use (1 PPD/45 pack years), OSA, obesity hypoventilation, complicated by psoriasis O2 2-3 L sleep/exertion, CPAP 13/[facility] Husband here No acute concerns with her breathing. Daily cough is productive of white sputum. She has been on prednisone 10 mg daily from primary physician for gout after hurting ankle. Emphasis again on smoking cessation. She describes good compliance and control of OSA with CPAP.  Review of Systems-see HPI Constitutional:   No-   weight loss, night sweats, fevers, chills, fatigue, lassitude. HEENT:    headaches,  difficulty swallowing, tooth/dental problems, sore throat,       + sneezing,+ itching,  ear ache,  +nasal congestion, +post nasal drip,  CV:  No-   chest pain, orthopnea, PND, +swelling in lower extremities, anasarca,   dizziness, palpitations Resp: +  shortness of breath with exertion or at rest.              + productive cough,  + non-productive cough,  No- coughing up of blood.              No-   change in color of mucus.  + wheezing.   Skin: psoriasis GI:  No-   heartburn, indigestion, abdominal pain, nausea, vomiting,  GU: No-    MS:  + joint pain or swelling. . Neuro-     nothing unusual Psych:  No- change in mood or affect. No depression or anxiety.  No memory loss.   Objective:   Physical Exam General- Alert, Oriented, Affect-appropriate, Distress- none acute, obese. On 2L O2 sat 93% Skin- +extensive psoriasis plaques Lymphadenopathy- none Head- atraumatic            Eyes- Gross vision intact, PERRLA, conjunctivae clear secretions **Note De-Identified Velazquez Obfuscation** Ears- Hearing, canals- cerumen/ debris R            Nose- + turbinate edema, no-Septal dev, mucus, polyps, erosion, perforation             Throat- Mallampati III , mucosa clear , drainage- none, tonsils- atrophic.                        +dentures Neck- flexible , trachea midline, no stridor , thyroid nl, carotid no bruit Chest - symmetrical excursion , unlabored           Heart/CV- RRR , no murmur , no gallop  , no rub, nl s1 s2                           - JVD- none , edema- none, stasis dermatitis changes+ bilateral, varices- none           Lung- loose rhonchi -None, dullness-none, rub- none, + shallow tachypnea           Chest wall-  Abd-  Br/ Gen/ Rectal- Not done, not indicated Extrem- +apparent lipoma right medial knee with stasis changes Neuro- grossly intact to observation

## 2016-03-21 NOTE — Telephone Encounter (Signed)
**Note De-Identified Esson Obfuscation** Orders replaced for urine creatinine

## 2016-03-21 NOTE — Telephone Encounter (Signed)
**Note De-Identified Hast Obfuscation** Lab has been re-orderd  Please let pt know

## 2016-03-22 LAB — PROTEIN / CREATININE RATIO, URINE
CREATININE, URINE: 50 mg/dL (ref 20–320)
PROTEIN CREATININE RATIO: 1460 mg/g{creat} — AB (ref 21–161)
TOTAL PROTEIN, URINE: 73 mg/dL — AB (ref 5–24)

## 2016-03-26 ENCOUNTER — Other Ambulatory Visit: Payer: Self-pay | Admitting: Internal Medicine

## 2016-04-09 DIAGNOSIS — E785 Hyperlipidemia, unspecified: Secondary | ICD-10-CM | POA: Diagnosis not present

## 2016-04-09 DIAGNOSIS — N181 Chronic kidney disease, stage 1: Secondary | ICD-10-CM | POA: Diagnosis not present

## 2016-04-09 DIAGNOSIS — I10 Essential (primary) hypertension: Secondary | ICD-10-CM | POA: Diagnosis not present

## 2016-04-09 DIAGNOSIS — R7302 Impaired glucose tolerance (oral): Secondary | ICD-10-CM | POA: Diagnosis not present

## 2016-04-09 DIAGNOSIS — L409 Psoriasis, unspecified: Secondary | ICD-10-CM | POA: Diagnosis not present

## 2016-04-09 DIAGNOSIS — R809 Proteinuria, unspecified: Secondary | ICD-10-CM | POA: Diagnosis not present

## 2016-04-10 ENCOUNTER — Other Ambulatory Visit: Payer: Self-pay | Admitting: Nephrology

## 2016-04-10 DIAGNOSIS — T85511A Breakdown (mechanical) of esophageal anti-reflux device, initial encounter: Secondary | ICD-10-CM | POA: Diagnosis not present

## 2016-04-10 DIAGNOSIS — J449 Chronic obstructive pulmonary disease, unspecified: Secondary | ICD-10-CM | POA: Diagnosis not present

## 2016-04-10 DIAGNOSIS — G4733 Obstructive sleep apnea (adult) (pediatric): Secondary | ICD-10-CM | POA: Diagnosis not present

## 2016-04-10 DIAGNOSIS — R809 Proteinuria, unspecified: Secondary | ICD-10-CM

## 2016-04-10 DIAGNOSIS — L400 Psoriasis vulgaris: Secondary | ICD-10-CM | POA: Diagnosis not present

## 2016-04-11 DIAGNOSIS — R809 Proteinuria, unspecified: Secondary | ICD-10-CM | POA: Diagnosis not present

## 2016-04-18 ENCOUNTER — Other Ambulatory Visit: Payer: Medicare Other

## 2016-04-23 DIAGNOSIS — Z79899 Other long term (current) drug therapy: Secondary | ICD-10-CM | POA: Diagnosis not present

## 2016-04-23 DIAGNOSIS — Z5181 Encounter for therapeutic drug level monitoring: Secondary | ICD-10-CM | POA: Diagnosis not present

## 2016-04-23 DIAGNOSIS — I8310 Varicose veins of unspecified lower extremity with inflammation: Secondary | ICD-10-CM | POA: Diagnosis not present

## 2016-04-23 DIAGNOSIS — L4050 Arthropathic psoriasis, unspecified: Secondary | ICD-10-CM | POA: Diagnosis not present

## 2016-04-23 DIAGNOSIS — L309 Dermatitis, unspecified: Secondary | ICD-10-CM | POA: Diagnosis not present

## 2016-04-23 DIAGNOSIS — L409 Psoriasis, unspecified: Secondary | ICD-10-CM | POA: Diagnosis not present

## 2016-04-24 ENCOUNTER — Other Ambulatory Visit: Payer: Self-pay | Admitting: Internal Medicine

## 2016-04-24 NOTE — Telephone Encounter (Signed)
**Note De-Identified Cea Obfuscation** Routine refill to [facility]

## 2016-04-25 ENCOUNTER — Ambulatory Visit
Admission: RE | Admit: 2016-04-25 | Discharge: 2016-04-25 | Disposition: A | Discharge status: Home / Self Care | Payer: Medicare Other | Source: Ambulatory Visit | Attending: Nephrology | Admitting: Nephrology

## 2016-04-25 DIAGNOSIS — R809 Proteinuria, unspecified: Secondary | ICD-10-CM

## 2016-04-25 DIAGNOSIS — J449 Chronic obstructive pulmonary disease, unspecified: Secondary | ICD-10-CM

## 2016-04-25 MED ORDER — FUROSEMIDE 40 MG PO TABS: 40.0000 mg | ORAL_TABLET | Freq: Every day | ORAL | 0 refills | Status: DC

## 2016-04-25 MED ORDER — POTASSIUM CHLORIDE ER 10 MEQ PO TBCR: 10.0000 meq | EXTENDED_RELEASE_TABLET | Freq: Two times a day (BID) | ORAL | 3 refills | Status: DC | PRN

## 2016-04-25 NOTE — Addendum Note (Signed)
**Note De-Identified Embry Obfuscation** Addended by: Della Goo C on: 04/25/2016 11:24 AM   Modules accepted: Orders

## 2016-04-25 NOTE — Telephone Encounter (Signed)
**Note De-identified Lawler Obfuscation** Medication refill sent to pharmacy  

## 2016-05-03 DIAGNOSIS — Z79899 Other long term (current) drug therapy: Secondary | ICD-10-CM | POA: Diagnosis not present

## 2016-05-03 DIAGNOSIS — L03119 Cellulitis of unspecified part of limb: Secondary | ICD-10-CM | POA: Diagnosis not present

## 2016-05-03 DIAGNOSIS — R6883 Chills (without fever): Secondary | ICD-10-CM | POA: Diagnosis not present

## 2016-05-03 DIAGNOSIS — L03115 Cellulitis of right lower limb: Secondary | ICD-10-CM | POA: Diagnosis not present

## 2016-05-04 ENCOUNTER — Other Ambulatory Visit: Payer: Self-pay | Admitting: Internal Medicine

## 2016-05-07 DIAGNOSIS — L959 Vasculitis limited to the skin, unspecified: Secondary | ICD-10-CM | POA: Diagnosis not present

## 2016-05-07 DIAGNOSIS — L98491 Non-pressure chronic ulcer of skin of other sites limited to breakdown of skin: Secondary | ICD-10-CM | POA: Diagnosis not present

## 2016-05-07 DIAGNOSIS — I878 Other specified disorders of veins: Secondary | ICD-10-CM | POA: Diagnosis not present

## 2016-05-07 DIAGNOSIS — L819 Disorder of pigmentation, unspecified: Secondary | ICD-10-CM | POA: Diagnosis not present

## 2016-05-07 DIAGNOSIS — R21 Rash and other nonspecific skin eruption: Secondary | ICD-10-CM | POA: Diagnosis not present

## 2016-05-07 DIAGNOSIS — L98499 Non-pressure chronic ulcer of skin of other sites with unspecified severity: Secondary | ICD-10-CM | POA: Insufficient documentation

## 2016-05-07 DIAGNOSIS — L0390 Cellulitis, unspecified: Secondary | ICD-10-CM | POA: Diagnosis not present

## 2016-05-09 DIAGNOSIS — R21 Rash and other nonspecific skin eruption: Secondary | ICD-10-CM | POA: Diagnosis not present

## 2016-05-09 DIAGNOSIS — L98491 Non-pressure chronic ulcer of skin of other sites limited to breakdown of skin: Secondary | ICD-10-CM | POA: Diagnosis not present

## 2016-05-11 DIAGNOSIS — I776 Arteritis, unspecified: Secondary | ICD-10-CM | POA: Diagnosis not present

## 2016-05-11 DIAGNOSIS — T85511A Breakdown (mechanical) of esophageal anti-reflux device, initial encounter: Secondary | ICD-10-CM | POA: Diagnosis not present

## 2016-05-11 DIAGNOSIS — G4733 Obstructive sleep apnea (adult) (pediatric): Secondary | ICD-10-CM | POA: Diagnosis not present

## 2016-05-11 DIAGNOSIS — L400 Psoriasis vulgaris: Secondary | ICD-10-CM | POA: Diagnosis not present

## 2016-05-11 DIAGNOSIS — J449 Chronic obstructive pulmonary disease, unspecified: Secondary | ICD-10-CM | POA: Diagnosis not present

## 2016-05-18 ENCOUNTER — Ambulatory Visit (INDEPENDENT_AMBULATORY_CARE_PROVIDER_SITE_OTHER): Payer: Medicare Other | Admitting: Family Medicine

## 2016-05-18 ENCOUNTER — Encounter: Payer: Self-pay | Admitting: Family Medicine

## 2016-05-18 VITALS — BP 139/79 | HR 101 | Temp 97.7°F | Ht 64.0 in | Wt 249.0 lb

## 2016-05-18 DIAGNOSIS — J449 Chronic obstructive pulmonary disease, unspecified: Secondary | ICD-10-CM

## 2016-05-18 DIAGNOSIS — E662 Morbid (severe) obesity with alveolar hypoventilation: Secondary | ICD-10-CM | POA: Diagnosis not present

## 2016-05-18 DIAGNOSIS — M797 Fibromyalgia: Secondary | ICD-10-CM

## 2016-05-18 DIAGNOSIS — E119 Type 2 diabetes mellitus without complications: Secondary | ICD-10-CM | POA: Diagnosis not present

## 2016-05-18 DIAGNOSIS — F341 Dysthymic disorder: Secondary | ICD-10-CM | POA: Diagnosis not present

## 2016-05-18 MED ORDER — DULOXETINE HCL 20 MG PO CPEP: 20.0000 mg | ORAL_CAPSULE | Freq: Every day | ORAL | 1 refills | Status: DC

## 2016-05-18 NOTE — Patient Instructions (Signed)
**Note De-Identified Chio Obfuscation** Great to meet you!  I have started cymbalta, take 1 pill once a day. Results are not seen until about 6 weeks out.   Come back in 1 month to discuss anxiety

## 2016-05-18 NOTE — Progress Notes (Signed)
**Note De-identified Slevin Obfuscation**   **Note De-Identified Rinkenberger Obfuscation** HPI  Patient presents today to establish care.  Patient is very complex medical history most significant for COPD, obesity hypoventilation syndrome, fibromyalgia, anxiety, depression, GERD, hypertension, peripheral vascular disease, and grade 1 diastolic dysfunction of the heart.  Review of her chart she also has type 2 diabetes, although it's diet controlled and also likely iatrogenic. Taking prednisone daily for reported vasculitis. She has 2 areas on her left leg consistent with recent biopsy/excision  Patient is doing well today, she has no complaints.  She has several follow-ups recently. She states that she's had some kidney problems recently and has close follow-up with [facility]. She's also seeing dermatology in [place] for vasculitis.  PMH: Smoking status noted- current every day smoker Complex past medical history including morbid obesity, vascular disease, hypertension, COPD, GERD, polymyalgia rheumatica, vasculitis, fibromyalgia, obesity hypoventilation syndrome, anxiety and depression,  Status post appendectomy, carpal tunnel release on the right, cholecystectomy, partial thyroidectomy, total hysterectomy  Family history positive for hyperlipidemia, heart disease, alpha-1 antitrypsin deficiency and sister  ROS: Per HPI  Objective: BP 139/79   Pulse (!) 101   Temp 97.7 F (36.5 C) (Oral)   Ht '5\' 4"'$  (1.626 m)   Wt 249 lb (112.9 kg)   BMI 42.74 kg/m  Gen: NAD, alert, cooperative with exam HEENT: NCAT CV: RRR, good S1/S2, no murmur Resp: Nonlabored, decreased air movement bilaterally, 3 L of oxygen Walbert nasal cannula Ext: No edema, warm Neuro: Alert and oriented, No gross deficits  Diabetic Foot Exam - Simple   Simple Foot Form Visual Inspection Sensation Testing Intact to touch and monofilament testing bilaterally:  Yes Pulse Check Posterior Tibialis and Dorsalis pulse intact bilaterally:  Yes Comments Yellow thickened toenails  bilaterally Left foot with 5 mm x 6 mm lesion with sutures still present on the dorsal foot.      Assessment and plan:  # Fibromyalgia, anxiety Patient states that she is doing well with Ativan. I expressed the dangers of using benzodiazepines with advanced COPD, I have recommended starting Cymbalta very low dose in hopes that we can eventually decrease the dose of Ativan This has good possibility of helping her with anxiety, depression, and fibromyalgia.   # Type 2 diabetes Mild, diet controlled Last A1c was 6.9 in March of this year. We will plan on repeating A1c next visit. Likely iatrogenic due to prednisone which is very necessary due to chronic vasculitis.  # COPD Severe advanced COPD, on 3 L of nasal cannula oxygen with activity and 2 L at rest. Also with obesity hypoventilation syndrome using a CPAP at night Appreciate pulmonology's recommendations and management. Continue Symbicort and nebulizers as currently treated.   # Morbid obesity Difficult situation, patient is limited by her respiratory status and fibromyalgia. He appears to be in a state of condition that would make it difficult to start a regular exercise regimen. Will recommend regular aerobic exercise starting with very short walks    Meds ordered this encounter  Medications  . DULoxetine (CYMBALTA) 20 MG capsule    Sig: Take 1 capsule (20 mg total) by mouth daily.    Dispense:  30 capsule    Refill:  [place], MD [facility] 05/18/2016, 2:03 PM

## 2016-05-21 DIAGNOSIS — L409 Psoriasis, unspecified: Secondary | ICD-10-CM | POA: Diagnosis not present

## 2016-05-21 DIAGNOSIS — I776 Arteritis, unspecified: Secondary | ICD-10-CM | POA: Diagnosis not present

## 2016-05-21 DIAGNOSIS — I872 Venous insufficiency (chronic) (peripheral): Secondary | ICD-10-CM | POA: Diagnosis not present

## 2016-05-21 DIAGNOSIS — I878 Other specified disorders of veins: Secondary | ICD-10-CM | POA: Diagnosis not present

## 2016-05-22 ENCOUNTER — Other Ambulatory Visit: Payer: Self-pay | Admitting: Internal Medicine

## 2016-05-22 DIAGNOSIS — M779 Enthesopathy, unspecified: Secondary | ICD-10-CM | POA: Diagnosis not present

## 2016-05-25 DIAGNOSIS — R809 Proteinuria, unspecified: Secondary | ICD-10-CM | POA: Diagnosis not present

## 2016-05-25 DIAGNOSIS — I129 Hypertensive chronic kidney disease with stage 1 through stage 4 chronic kidney disease, or unspecified chronic kidney disease: Secondary | ICD-10-CM | POA: Diagnosis not present

## 2016-05-25 DIAGNOSIS — G4733 Obstructive sleep apnea (adult) (pediatric): Secondary | ICD-10-CM | POA: Diagnosis not present

## 2016-05-25 DIAGNOSIS — N181 Chronic kidney disease, stage 1: Secondary | ICD-10-CM | POA: Diagnosis not present

## 2016-05-25 DIAGNOSIS — J449 Chronic obstructive pulmonary disease, unspecified: Secondary | ICD-10-CM | POA: Diagnosis not present

## 2016-05-25 DIAGNOSIS — N390 Urinary tract infection, site not specified: Secondary | ICD-10-CM | POA: Diagnosis not present

## 2016-05-28 DIAGNOSIS — I776 Arteritis, unspecified: Secondary | ICD-10-CM | POA: Diagnosis not present

## 2016-05-28 DIAGNOSIS — Z9981 Dependence on supplemental oxygen: Secondary | ICD-10-CM | POA: Diagnosis not present

## 2016-05-28 DIAGNOSIS — I872 Venous insufficiency (chronic) (peripheral): Secondary | ICD-10-CM | POA: Diagnosis not present

## 2016-05-28 DIAGNOSIS — L409 Psoriasis, unspecified: Secondary | ICD-10-CM | POA: Diagnosis not present

## 2016-05-28 DIAGNOSIS — I878 Other specified disorders of veins: Secondary | ICD-10-CM | POA: Insufficient documentation

## 2016-05-31 ENCOUNTER — Encounter: Payer: Self-pay | Admitting: Internal Medicine

## 2016-06-06 ENCOUNTER — Encounter: Payer: Self-pay | Admitting: Family Medicine

## 2016-06-07 ENCOUNTER — Telehealth: Payer: Self-pay | Admitting: Emergency Medicine

## 2016-06-07 ENCOUNTER — Ambulatory Visit: Payer: Medicare Other | Admitting: Internal Medicine

## 2016-06-08 NOTE — Telephone Encounter (Signed)
**Note De-identified Holstine Obfuscation** error 

## 2016-06-11 DIAGNOSIS — G4733 Obstructive sleep apnea (adult) (pediatric): Secondary | ICD-10-CM | POA: Diagnosis not present

## 2016-06-11 DIAGNOSIS — L400 Psoriasis vulgaris: Secondary | ICD-10-CM | POA: Diagnosis not present

## 2016-06-11 DIAGNOSIS — J449 Chronic obstructive pulmonary disease, unspecified: Secondary | ICD-10-CM | POA: Diagnosis not present

## 2016-06-11 DIAGNOSIS — T85511A Breakdown (mechanical) of esophageal anti-reflux device, initial encounter: Secondary | ICD-10-CM | POA: Diagnosis not present

## 2016-06-18 ENCOUNTER — Encounter: Payer: Self-pay | Admitting: Family Medicine

## 2016-06-18 ENCOUNTER — Ambulatory Visit (INDEPENDENT_AMBULATORY_CARE_PROVIDER_SITE_OTHER): Payer: Medicare Other | Admitting: Family Medicine

## 2016-06-18 VITALS — BP 134/83 | HR 84 | Temp 97.4°F | Ht 64.0 in | Wt 258.6 lb

## 2016-06-18 DIAGNOSIS — E119 Type 2 diabetes mellitus without complications: Secondary | ICD-10-CM

## 2016-06-18 DIAGNOSIS — K219 Gastro-esophageal reflux disease without esophagitis: Secondary | ICD-10-CM | POA: Diagnosis not present

## 2016-06-18 DIAGNOSIS — J441 Chronic obstructive pulmonary disease with (acute) exacerbation: Secondary | ICD-10-CM

## 2016-06-18 DIAGNOSIS — F419 Anxiety disorder, unspecified: Secondary | ICD-10-CM | POA: Diagnosis not present

## 2016-06-18 LAB — BAYER DCA HB A1C WAIVED: HB A1C: 6.4 % (ref <7.0)

## 2016-06-18 MED ORDER — CITALOPRAM HYDROBROMIDE 10 MG PO TABS: 10.0000 mg | ORAL_TABLET | Freq: Every day | ORAL | 1 refills | Status: DC

## 2016-06-18 MED ORDER — AMOXICILLIN-POT CLAVULANATE 875-125 MG PO TABS: 1.0000 | ORAL_TABLET | Freq: Two times a day (BID) | ORAL | 0 refills | Status: DC

## 2016-06-18 NOTE — Patient Instructions (Signed)
**Note De-Identified Mirza Obfuscation** Great to see you!  Come back in 1 month to discuss anxiety   Start citalopram 1 pill once a day.   I have also sent Augmentin to treat a COPD exacerbation.

## 2016-06-18 NOTE — Progress Notes (Signed)
**Note De-identified Bhargava Obfuscation**   **Note De-Identified Sprick Obfuscation** HPI  Patient presents today here for follow-up of chronic conditions.  Patient's has a complex medical history and establish care last month.  We started Cymbalta for anxiety, she had elevated hypertension to the 051G systolic She denies depression or suicidal thoughts.  COPD Long history of COPD, reports good medication compliance, 3 L G[place] nasal cannula normally. She's had increased shortness of breath and increased volume of sputum in her cough. Cough is at baseline.  Diabetes Patient explains that this is most likely due to chronic steroid use currently for vasculitis She is titrating down the steroids. No medications.  GERD Her PPIs been stopped by nephrology, she has had heartburn and indigestion since that time She has not tried an H2 blocker   PMH: Smoking status noted ROS: Per HPI  Objective: BP 134/83   Pulse 84   Temp 97.4 F (36.3 C) (Oral)   Ht '5\' 4"'$  (1.626 m)   Wt 258 lb 9.6 oz (117.3 kg)   BMI 44.39 kg/m  Gen: NAD, alert, cooperative with exam HEENT: NCAT CV: RRR, good S1/S2, no murmur Resp: CTABL, no wheezes, non-labored Ext: 1+ pitting symmetric slightly tender edema, hemosiderin staining bilaterally Neuro: Alert and oriented, No gross deficits  Skin: Healing ulcers on bilateral lower extremities consistent with vasculitis  Assessment and plan:  # 2 diabetes Diet controlled, exacerbated by prednisone Continue to monitor A1c controlled at 6.4  # COPD exacerbation Patient is already currently on prednisone, she's been tapering down with vasculitis, she does not have any wheezing to indicate need for additional prednisone Augmentin prescribed  # GERD Recommended Pepcid or Zantac, restart PPI if needed after trying H2 blockers   Anxiety Starting Celexa today patient has current pill of Ativan, will plan to prescribe 0.5 mg Ativan 60 per month After control medication started, consider decreasing dose with severe COPD   Orders Placed  This Encounter  Procedures  . Bayer DCA Hb A1c Waived    Meds ordered this encounter  Medications  . colchicine 0.6 MG tablet    Sig: Take 0.6 mg by mouth.  . citalopram (CELEXA) 10 MG tablet    Sig: Take 1 tablet (10 mg total) by mouth daily.    Dispense:  30 tablet    Refill:  1  . amoxicillin-clavulanate (AUGMENTIN) 875-125 MG tablet    Sig: Take 1 tablet by mouth 2 (two) times daily.    Dispense:  20 tablet    Refill:  0    Laroy Apple, MD [facility] 06/18/2016, 11:51 AM

## 2016-06-19 DIAGNOSIS — Z7952 Long term (current) use of systemic steroids: Secondary | ICD-10-CM | POA: Diagnosis not present

## 2016-06-19 DIAGNOSIS — L409 Psoriasis, unspecified: Secondary | ICD-10-CM | POA: Diagnosis not present

## 2016-06-19 DIAGNOSIS — I776 Arteritis, unspecified: Secondary | ICD-10-CM | POA: Diagnosis not present

## 2016-06-26 ENCOUNTER — Encounter: Payer: Self-pay | Admitting: Family Medicine

## 2016-07-11 DIAGNOSIS — G4733 Obstructive sleep apnea (adult) (pediatric): Secondary | ICD-10-CM | POA: Diagnosis not present

## 2016-07-11 DIAGNOSIS — J449 Chronic obstructive pulmonary disease, unspecified: Secondary | ICD-10-CM | POA: Diagnosis not present

## 2016-07-11 DIAGNOSIS — T85511A Breakdown (mechanical) of esophageal anti-reflux device, initial encounter: Secondary | ICD-10-CM | POA: Diagnosis not present

## 2016-07-11 DIAGNOSIS — L400 Psoriasis vulgaris: Secondary | ICD-10-CM | POA: Diagnosis not present

## 2016-07-16 ENCOUNTER — Other Ambulatory Visit: Payer: Self-pay | Admitting: Internal Medicine

## 2016-07-20 ENCOUNTER — Ambulatory Visit (INDEPENDENT_AMBULATORY_CARE_PROVIDER_SITE_OTHER): Payer: Medicare Other | Admitting: Family Medicine

## 2016-07-20 ENCOUNTER — Encounter: Payer: Self-pay | Admitting: Family Medicine

## 2016-07-20 VITALS — BP 131/75 | HR 94 | Temp 97.7°F | Ht 64.0 in | Wt 252.0 lb

## 2016-07-20 DIAGNOSIS — F419 Anxiety disorder, unspecified: Secondary | ICD-10-CM

## 2016-07-20 DIAGNOSIS — F17200 Nicotine dependence, unspecified, uncomplicated: Secondary | ICD-10-CM | POA: Diagnosis not present

## 2016-07-20 DIAGNOSIS — M7061 Trochanteric bursitis, right hip: Secondary | ICD-10-CM

## 2016-07-20 DIAGNOSIS — E2839 Other primary ovarian failure: Secondary | ICD-10-CM | POA: Diagnosis not present

## 2016-07-20 MED ORDER — LORAZEPAM 0.5 MG PO TABS: 0.5000 mg | ORAL_TABLET | Freq: Two times a day (BID) | ORAL | 1 refills | Status: DC | PRN

## 2016-07-20 MED ORDER — CITALOPRAM HYDROBROMIDE 20 MG PO TABS: 20.0000 mg | ORAL_TABLET | Freq: Every day | ORAL | 5 refills | Status: DC

## 2016-07-20 NOTE — Progress Notes (Signed)
**Note De-identified [place] Obfuscation**   **Note De-Identified Heckel Obfuscation** HPI  Patient presents today  Here for follow-up anxiety  Patient started Cymbalta a few months ago, she had elevated blood pressure, last month we started Celexa.she's had improvement of anxiety and depressi has no suicidal thoughts. She has stable dose of Ativan.  She complains of right lateral hip pain, sometimes causing lifting her leg to climb into their truck She is titrating down her prednisone dose.  She is a smoker, considering quitting but not ready.  She would like a DEXA scan   PMH: Smoking status noted ROS: Per HPI  Objective: BP 131/75   Pulse 94   Temp 97.7 F (36.5 C) (Oral)   Ht '5\' 4"'$  (1.626 m)   Wt 252 lb (114.3 kg)   BMI 43.26 kg/m  Gen: NAD, alert, cooperative with exam HEENT: NCAT, EOMI, PERRL CV: RRR, good S1/S2, no murmur Resp: CTABL, no wheezes, non-labored Ext: Hemosiderin staining, trace pitting edema Neuro: Alert and oriented, No gross deficits  MSK: Tenderness to palp over R greater troch  Psych: No SI, appropriate mood and affect  Depression screen [facility] 2/9 07/20/2016 06/18/2016 05/18/2016 12/06/2015 11/26/2014  Decreased Interest 0 0 0 0 0  Down, Depressed, Hopeless 0 0 0 0 0  PHQ - 2 Score 0 0 0 0 0   GAD 7 : Generalized Anxiety Score 07/20/2016 06/18/2016  Nervous, Anxious, on Edge 0 2  Control/stop worrying 3 3  Worry too much - different things 3 3  Trouble relaxing 3 0  Restless 0 0  Easily annoyed or irritable 1 2  Afraid - awful might happen 3 0  Total GAD 7 Score 13 10  Anxiety Difficulty Not difficult at all Not difficult at all      Assessment and plan:  # Anxiety Improving subjectively Tolerating Celexa well, states she had transient sleepiness which resoew days. Increased 20 mg Refilled Ativan, discuss no refills without appointment Follow-up 2 months  # tobacco user, tobacco abuse Recommended cessation, she's considering  # estrogen deficiency, screening for osteoporosis DEXA scan ordered  Greater  trochanteric bursitis Right sided symptoms, new onset Considering co morbidities I do not want to inject her with steroids or give her exposure to NSAIDs at this time Recommended moderate exercise and ice    Orders Placed This Encounter  Procedures  . DG [facility] DEXA    Order Specific Question:   Reason for Exam (SYMPTOM  OR DIAGNOSIS REQUIRED)    Answer:   screening, heavy steroid use    Meds ordered this encounter  Medications  . DISCONTD: LORazepam (ATIVAN) 0.5 MG tablet    Sig: Take 1 tablet (0.5 mg total) by mouth 2 (two) times daily as needed for anxiety.    Dispense:  60 tablet    Refill:  1    30 day supply, please do not fill until 30 days after last fill.  . citalopram (CELEXA) 20 MG tablet    Sig: Take 1 tablet (20 mg total) by mouth daily.    Dispense:  30 tablet    Refill:  5  . LORazepam (ATIVAN) 0.5 MG tablet    Sig: Take 1 tablet (0.5 mg total) by mouth 2 (two) times daily as needed for anxiety.    Dispense:  60 tablet    Refill:  1    30 day supply, please do not fill until 30 days after last fill.    Laroy Apple, MD [facility] 07/20/2016, 11:47 AM

## 2016-07-20 NOTE — Patient Instructions (Signed)
**Note De-Identified Belkin Obfuscation** Great to see you!  I have increased the celexa dose, you may have transient sleepiness again like you did when we started  If you can, try to cut back to 1/2 pill of ativan as tolerated.   Come back in 2 months for follow up

## 2016-07-23 ENCOUNTER — Other Ambulatory Visit: Payer: Medicare Other

## 2016-08-06 DIAGNOSIS — Z7952 Long term (current) use of systemic steroids: Secondary | ICD-10-CM | POA: Diagnosis not present

## 2016-08-06 DIAGNOSIS — Z79899 Other long term (current) drug therapy: Secondary | ICD-10-CM | POA: Diagnosis not present

## 2016-08-06 DIAGNOSIS — I878 Other specified disorders of veins: Secondary | ICD-10-CM | POA: Diagnosis not present

## 2016-08-06 DIAGNOSIS — I776 Arteritis, unspecified: Secondary | ICD-10-CM | POA: Diagnosis not present

## 2016-08-06 DIAGNOSIS — L409 Psoriasis, unspecified: Secondary | ICD-10-CM | POA: Diagnosis not present

## 2016-08-06 DIAGNOSIS — L4050 Arthropathic psoriasis, unspecified: Secondary | ICD-10-CM | POA: Diagnosis not present

## 2016-08-11 DIAGNOSIS — G4733 Obstructive sleep apnea (adult) (pediatric): Secondary | ICD-10-CM | POA: Diagnosis not present

## 2016-08-11 DIAGNOSIS — L400 Psoriasis vulgaris: Secondary | ICD-10-CM | POA: Diagnosis not present

## 2016-08-11 DIAGNOSIS — J449 Chronic obstructive pulmonary disease, unspecified: Secondary | ICD-10-CM | POA: Diagnosis not present

## 2016-08-11 DIAGNOSIS — T85511A Breakdown (mechanical) of esophageal anti-reflux device, initial encounter: Secondary | ICD-10-CM | POA: Diagnosis not present

## 2016-08-15 ENCOUNTER — Encounter: Payer: Self-pay | Admitting: Internal Medicine

## 2016-09-10 DIAGNOSIS — L400 Psoriasis vulgaris: Secondary | ICD-10-CM | POA: Diagnosis not present

## 2016-09-10 DIAGNOSIS — G4733 Obstructive sleep apnea (adult) (pediatric): Secondary | ICD-10-CM | POA: Diagnosis not present

## 2016-09-10 DIAGNOSIS — J449 Chronic obstructive pulmonary disease, unspecified: Secondary | ICD-10-CM | POA: Diagnosis not present

## 2016-09-10 DIAGNOSIS — T85511A Breakdown (mechanical) of esophageal anti-reflux device, initial encounter: Secondary | ICD-10-CM | POA: Diagnosis not present

## 2016-09-21 ENCOUNTER — Ambulatory Visit (INDEPENDENT_AMBULATORY_CARE_PROVIDER_SITE_OTHER): Payer: Medicare Other | Admitting: Family Medicine

## 2016-09-21 ENCOUNTER — Encounter: Payer: Self-pay | Admitting: Family Medicine

## 2016-09-21 VITALS — BP 151/81 | HR 97 | Temp 97.0°F | Ht 64.0 in | Wt 253.8 lb

## 2016-09-21 DIAGNOSIS — I10 Essential (primary) hypertension: Secondary | ICD-10-CM | POA: Diagnosis not present

## 2016-09-21 DIAGNOSIS — Z23 Encounter for immunization: Secondary | ICD-10-CM

## 2016-09-21 DIAGNOSIS — F419 Anxiety disorder, unspecified: Secondary | ICD-10-CM

## 2016-09-21 DIAGNOSIS — E119 Type 2 diabetes mellitus without complications: Secondary | ICD-10-CM

## 2016-09-21 LAB — BAYER DCA HB A1C WAIVED: HB A1C (BAYER DCA - WAIVED): 5.2 % (ref <7.0)

## 2016-09-21 MED ORDER — CITALOPRAM HYDROBROMIDE 10 MG PO TABS: 10.0000 mg | ORAL_TABLET | Freq: Every day | ORAL | 5 refills | Status: DC

## 2016-09-21 MED ORDER — LORAZEPAM 0.5 MG PO TABS: 0.5000 mg | ORAL_TABLET | Freq: Two times a day (BID) | ORAL | 2 refills | Status: DC | PRN

## 2016-09-21 NOTE — Progress Notes (Signed)
**Note De-identified Olthoff Obfuscation**   **Note De-Identified Laufer Obfuscation** HPI  Patient presents today here for follow-up of chronic medical conditions   Type 2 diabetes Does not check blood sugar, however she's watching her diet much more carefully. Limited physical activity due to severe COPD.  Hypertension Diet controlled Reports severe hypotension with treatment with lisinopril previously.  Anxiety States that depression and anxiety are stable. Anxiety is high due to multiple people living in the house currently. States that Celexa was helpful and had no side effects at 10 mg, however after increasing the 20 she has had increased somnolence. She is using Ativan 1-2 times daily.  She would like a flu shot today  PMH: Smoking status noted ROS: Per HPI  Objective: BP (!) 151/81   Pulse 97   Temp 97 F (36.1 C) (Oral)   Ht '5\' 4"'$  (1.626 m)   Wt 253 lb 12.8 oz (115.1 kg)   BMI 43.56 kg/m  Gen: NAD, alert, cooperative with exam HEENT: NCAT CV: RRR, good S1/S2, no murmur Resp: CTABL, no wheezes, non-labored Ext: No edema, warm Neuro: Alert and oriented, No gross deficits  Skin: Bilateral forearms with several silvery scaly lesions consistent with severe psoriasis.  Assessment and plan:  # Type 2 diabetes Well-controlled with only diet Continue watching diet, congratulated on control Patient does have signs of nephropathy, however her creatinine is well controlled. I offered ACE inhibitor today a very low dose, however she is hesitant to start this due to previous hypotension Follow-up with nephrology  # Hypertension Well-controlled with only diet, continue to monitor Several readings previously that are clearly hypertensive.  # Anxiety Stable to slightly worse, tolerating Celexa 10 mg that of and 20 mg, decrease to 10 mg New Rx sent Refilled Ativan 3 months  # Encounter for immunization Influenza vaccine given, counseling provided for all components.    Orders Placed This Encounter  Procedures  . Flu Vaccine QUAD 36+ mos IM    . Bayer DCA Hb A1c Waived    Meds ordered this encounter  Medications  . LORazepam (ATIVAN) 0.5 MG tablet    Sig: Take 1 tablet (0.5 mg total) by mouth 2 (two) times daily as needed for anxiety.    Dispense:  60 tablet    Refill:  2    30 day supply, please do not fill until 30 days after last fill.  . citalopram (CELEXA) 10 MG tablet    Sig: Take 1 tablet (10 mg total) by mouth daily.    Dispense:  30 tablet    Refill:  5    Back to 10 mg, please dc previous Rx's    Laroy Apple, MD [facility] 09/21/2016, 11:44 AM

## 2016-09-21 NOTE — Patient Instructions (Addendum)
**Note De-Identified Gilmore Obfuscation** Great to see you!  Come back in 3 months

## 2016-10-05 ENCOUNTER — Ambulatory Visit: Payer: Medicare Other | Admitting: Internal Medicine

## 2016-10-08 DIAGNOSIS — G4733 Obstructive sleep apnea (adult) (pediatric): Secondary | ICD-10-CM | POA: Diagnosis not present

## 2016-10-08 DIAGNOSIS — L409 Psoriasis, unspecified: Secondary | ICD-10-CM | POA: Diagnosis not present

## 2016-10-08 DIAGNOSIS — Z8679 Personal history of other diseases of the circulatory system: Secondary | ICD-10-CM | POA: Diagnosis not present

## 2016-10-11 DIAGNOSIS — J449 Chronic obstructive pulmonary disease, unspecified: Secondary | ICD-10-CM | POA: Diagnosis not present

## 2016-10-11 DIAGNOSIS — L400 Psoriasis vulgaris: Secondary | ICD-10-CM | POA: Diagnosis not present

## 2016-10-11 DIAGNOSIS — G4733 Obstructive sleep apnea (adult) (pediatric): Secondary | ICD-10-CM | POA: Diagnosis not present

## 2016-10-11 DIAGNOSIS — T85511A Breakdown (mechanical) of esophageal anti-reflux device, initial encounter: Secondary | ICD-10-CM | POA: Diagnosis not present

## 2016-10-18 ENCOUNTER — Ambulatory Visit: Payer: Medicare Other | Admitting: Family Medicine

## 2016-11-01 ENCOUNTER — Other Ambulatory Visit: Payer: Self-pay | Admitting: Family Medicine

## 2016-11-01 DIAGNOSIS — Z1231 Encounter for screening mammogram for malignant neoplasm of breast: Secondary | ICD-10-CM

## 2016-11-05 ENCOUNTER — Ambulatory Visit (INDEPENDENT_AMBULATORY_CARE_PROVIDER_SITE_OTHER): Payer: Medicare Other | Admitting: Family Medicine

## 2016-11-05 ENCOUNTER — Ambulatory Visit (INDEPENDENT_AMBULATORY_CARE_PROVIDER_SITE_OTHER): Payer: Medicare Other

## 2016-11-05 ENCOUNTER — Other Ambulatory Visit: Payer: Self-pay | Admitting: Family Medicine

## 2016-11-05 ENCOUNTER — Encounter: Payer: Self-pay | Admitting: Family Medicine

## 2016-11-05 VITALS — BP 126/80 | HR 121 | Temp 98.3°F | Ht 64.0 in | Wt 246.2 lb

## 2016-11-05 DIAGNOSIS — J441 Chronic obstructive pulmonary disease with (acute) exacerbation: Secondary | ICD-10-CM

## 2016-11-05 DIAGNOSIS — M79675 Pain in left toe(s): Secondary | ICD-10-CM | POA: Diagnosis not present

## 2016-11-05 DIAGNOSIS — S92912A Unspecified fracture of left toe(s), initial encounter for closed fracture: Secondary | ICD-10-CM | POA: Diagnosis not present

## 2016-11-05 DIAGNOSIS — S81812A Laceration without foreign body, left lower leg, initial encounter: Secondary | ICD-10-CM

## 2016-11-05 MED ORDER — CEFDINIR 300 MG PO CAPS: 300.0000 mg | ORAL_CAPSULE | Freq: Two times a day (BID) | ORAL | 0 refills | Status: DC

## 2016-11-05 MED ORDER — PREDNISONE 20 MG PO TABS: ORAL_TABLET | ORAL | 0 refills | Status: DC

## 2016-11-05 NOTE — Progress Notes (Signed)
**Note De-identified Waltrip Obfuscation**   **Note De-Identified Guertin Obfuscation** HPI  Patient presents today here with left lateral foot pain.  Patient explains that she was lying in her bed when she fell off last Wednesday night hitting her foot on her nightstand. She's had left distal lateral foot pain since that time. It hurts worse with walking.  Friday she had another accident dropping her oxygen tank on her left medial leg causing a small skin tear. She had lots of bleeding with that. However the bleeding has stopped she does have some thick white drainage from the area.  Cough. Patient states that she's had cough and shortness of breath for the last 3 days. No fevers, chills, sweats. No difficulty tolerating foods or fluids.  Ibuprofen is causing sleepiness. She has not tried ice.  PMH: Smoking status noted ROS: Per HPI  Objective: BP 126/80   Pulse (!) 121   Temp 98.3 F (36.8 C) (Oral)   Ht '5\' 4"'$  (1.626 m)   Wt 246 lb 3.2 oz (111.7 kg)   BMI 42.26 kg/m  Gen: NAD, alert, cooperative with exam HEENT: NCAT, EOMI, PERRL CV: RRR, good S1/S2, no murmur Resp: CTABL, no wheezes, non-labored, oxygen in place [place] nasal cannula Ext: No edema, warm Neuro: Alert and oriented, No gross deficits Skin Left medial lower leg with approximately 2 cm skin tear with a small amount of yellow discharge, no bleeding  MSK Tenderness to palpation of the left fifth toe, redness, mild swelling.  X-ray: Small fracture on the distal portion of the proximal left fifth phalanges on the left foot Loss of bone mineralization on the second toe over the proximal phalanges   Assessment and plan:  # COPD exacerbation Treated with Omnicef plus prednisone Prednisone should help toe pain as well Discussed that long-term use of prednisone could actually impair healing. Her respiratory distress on exam, air movement is good.  # Closed displaced fracture of the phalanx of the toe of the left foot Placed in a postop shoe today, referred to orthopedics given multiple  comorbidities. Recommended ice for pain, prednisone will also help.  # Laceration Healing, small amount of yellow discharge, other than discharge there are no signs of obvious infection No fluctuance, induration Mupirocin + telfa with coban placed today.    Orders Placed This Encounter  Procedures  . DG Foot Complete Left    Standing Status:   Future    Number of Occurrences:   1    Standing Expiration Date:   01/03/2018    Order Specific Question:   Reason for Exam (SYMPTOM  OR DIAGNOSIS REQUIRED)    Answer:   toe pain    Order Specific Question:   Preferred imaging location?    Answer:   Internal    Meds ordered this encounter  Medications  . Apremilast (OTEZLA PO)    Sig: Take 60 mg by mouth daily. '30mg'$  In the morning and '30mg'$  at night.    Laroy Apple, MD [facility] 11/05/2016, 8:46 AM

## 2016-11-05 NOTE — Patient Instructions (Signed)
**Note De-Identified Coye Obfuscation** Great to see you!  Use ice on your foot 15 minutes 6-8 times a day.   Wear the post-op shoe whenever you are walking.   We are working on a referral.

## 2016-11-05 NOTE — Telephone Encounter (Signed)
**Note De-Identified Capano Obfuscation** Seen today, last filled 10/05/16. Route to pool for call in

## 2016-11-06 NOTE — Telephone Encounter (Signed)
**Note De-identified Lautner Obfuscation** Refill called to [facility] 

## 2016-11-08 DIAGNOSIS — S92515A Nondisplaced fracture of proximal phalanx of left lesser toe(s), initial encounter for closed fracture: Secondary | ICD-10-CM | POA: Diagnosis not present

## 2016-11-09 ENCOUNTER — Telehealth: Payer: Self-pay | Admitting: Internal Medicine

## 2016-11-09 MED ORDER — OSELTAMIVIR PHOSPHATE 75 MG PO CAPS: 75.0000 mg | ORAL_CAPSULE | Freq: Two times a day (BID) | ORAL | 0 refills | Status: DC

## 2016-11-09 NOTE — Telephone Encounter (Signed)
**Note De-Identified Sabatino Obfuscation** Called and spoke to pt's daughter, Cecille Rubin. Cecille Rubin states the pt was seen by PCP on 2/5 after falling a fx her toe, at that time the pt was given Azithromycin and pred d/t respiratory issues. Pt c/o increase in SOB, prod cough with green mucus, body aches, chills and sweats, chest discomfort from coughing since 11/05/16. Cecille Rubin states she thinks the pt's s/s have worsened since 2/5 despite the abx and pred. Pt has completed the abx and still taking the pred. Pt just started having N/V/D on 11/08/2016. Cecille Rubin is concerned the pt may have the flu.   Dr. Annamaria Boots please advise. Thanks.   Allergies  Allergen Reactions  . Doxycycline Shortness Of Breath and Other (See Comments)    Drug interaction with Soriatane  . Tramadol Shortness Of Breath    Did not work  . Milnacipran     REACTION: dizzy    Current Outpatient Prescriptions on File Prior to Visit  Medication Sig Dispense Refill  . albuterol (PROVENTIL) (2.5 MG/3ML) 0.083% nebulizer solution Take 3 mLs (2.5 mg total) by nebulization every 4 (four) hours as needed for wheezing or shortness of breath. DX: 496 180 vial 11  . Apremilast (OTEZLA PO) Take 60 mg by mouth daily. '30mg'$  In the morning and '30mg'$  at night.    Marland Kitchen atorvastatin (LIPITOR) 80 MG tablet Take 1 tablet (80 mg total) by mouth daily. 90 tablet 1  . budesonide-formoterol (SYMBICORT) 160-4.5 MCG/ACT inhaler Inhale 2 puffs into the lungs 2 (two) times daily. 1 Inhaler 2  . cefdinir (OMNICEF) 300 MG capsule Take 1 capsule (300 mg total) by mouth 2 (two) times daily. 1 po BID 20 capsule 0  . citalopram (CELEXA) 10 MG tablet Take 1 tablet (10 mg total) by mouth daily. 30 tablet 5  . clotrimazole (LOTRIMIN) 1 % cream APPLY AS DIRECTED BY YOUR PHYSICIAN. -FOR TOPICAL USE- 30 g 0  . fenofibrate 160 MG tablet Take 1 tablet (160 mg total) by mouth daily. 90 tablet 3  . folic acid (FOLVITE) 1 MG tablet Take 1 mg by mouth daily.     Marland Kitchen LORazepam (ATIVAN) 0.5 MG tablet TAKE 1 TABLET 2 TIMES DAILY AS NEEDED FOR  ANXIETY 60 tablet 0  . methotrexate (RHEUMATREX) 2.5 MG tablet 25 mg once a week. On wednesday Caution:Chemotherapy. Protect from light.    . montelukast (SINGULAIR) 10 MG tablet Take 1 tablet (10 mg total) by mouth daily. 90 tablet 3  . Nebulizers (COMPRESSOR/NEBULIZER) MISC Use up to 4 times daily when needed 1 each 0  . potassium chloride (K-DUR) 10 MEQ tablet Take 1 tablet (10 mEq total) by mouth 2 (two) times daily as needed (along with Lasix.). 60 tablet 3  . predniSONE (DELTASONE) 20 MG tablet 2 po at same time daily for 5 days 10 tablet 0  . [DISCONTINUED] doxepin (SINEQUAN) 10 MG capsule 1-2 at bedtime     . [DISCONTINUED] fexofenadine (ALLEGRA) 180 MG tablet Take 180 mg by mouth daily.       No current facility-administered medications on file prior to visit.

## 2016-11-09 NOTE — Telephone Encounter (Signed)
**Note De-Identified Lokken Obfuscation** Spoke with pt's daughter, Cecille Rubin. She is aware of CY's recommendations. Rx has been sent in. Nothing further was needed.

## 2016-11-09 NOTE — Telephone Encounter (Signed)
**Note De-Identified Croghan Obfuscation** Offer tamiflu 75 mg, # 10, 1 twice daily x 5 days.  Stay well-hydrated  Ok to use otc symptom therapy like Tylenol Cold and Flu, if those help

## 2016-11-11 DIAGNOSIS — T85511A Breakdown (mechanical) of esophageal anti-reflux device, initial encounter: Secondary | ICD-10-CM | POA: Diagnosis not present

## 2016-11-11 DIAGNOSIS — G4733 Obstructive sleep apnea (adult) (pediatric): Secondary | ICD-10-CM | POA: Diagnosis not present

## 2016-11-11 DIAGNOSIS — L400 Psoriasis vulgaris: Secondary | ICD-10-CM | POA: Diagnosis not present

## 2016-11-11 DIAGNOSIS — J449 Chronic obstructive pulmonary disease, unspecified: Secondary | ICD-10-CM | POA: Diagnosis not present

## 2016-11-19 ENCOUNTER — Other Ambulatory Visit: Payer: Self-pay | Admitting: Internal Medicine

## 2016-11-26 DIAGNOSIS — I129 Hypertensive chronic kidney disease with stage 1 through stage 4 chronic kidney disease, or unspecified chronic kidney disease: Secondary | ICD-10-CM | POA: Diagnosis not present

## 2016-11-26 DIAGNOSIS — R809 Proteinuria, unspecified: Secondary | ICD-10-CM | POA: Diagnosis not present

## 2016-11-26 DIAGNOSIS — J449 Chronic obstructive pulmonary disease, unspecified: Secondary | ICD-10-CM | POA: Diagnosis not present

## 2016-11-26 DIAGNOSIS — N181 Chronic kidney disease, stage 1: Secondary | ICD-10-CM | POA: Diagnosis not present

## 2016-11-26 DIAGNOSIS — R7302 Impaired glucose tolerance (oral): Secondary | ICD-10-CM | POA: Diagnosis not present

## 2016-11-30 ENCOUNTER — Ambulatory Visit (INDEPENDENT_AMBULATORY_CARE_PROVIDER_SITE_OTHER): Payer: Medicare Other | Admitting: Internal Medicine

## 2016-11-30 ENCOUNTER — Encounter: Payer: Self-pay | Admitting: Internal Medicine

## 2016-11-30 ENCOUNTER — Ambulatory Visit (INDEPENDENT_AMBULATORY_CARE_PROVIDER_SITE_OTHER)
Admission: RE | Admit: 2016-11-30 | Discharge: 2016-11-30 | Disposition: A | Discharge status: Home / Self Care | Payer: Medicare Other | Source: Ambulatory Visit | Attending: Internal Medicine | Admitting: Internal Medicine

## 2016-11-30 VITALS — BP 128/60 | HR 113 | Ht 64.0 in | Wt 247.0 lb

## 2016-11-30 DIAGNOSIS — J449 Chronic obstructive pulmonary disease, unspecified: Secondary | ICD-10-CM

## 2016-11-30 DIAGNOSIS — J9611 Chronic respiratory failure with hypoxia: Secondary | ICD-10-CM | POA: Diagnosis not present

## 2016-11-30 DIAGNOSIS — F17200 Nicotine dependence, unspecified, uncomplicated: Secondary | ICD-10-CM | POA: Diagnosis not present

## 2016-11-30 DIAGNOSIS — G4733 Obstructive sleep apnea (adult) (pediatric): Secondary | ICD-10-CM

## 2016-11-30 DIAGNOSIS — R05 Cough: Secondary | ICD-10-CM | POA: Diagnosis not present

## 2016-11-30 MED ORDER — ALBUTEROL SULFATE HFA 108 (90 BASE) MCG/ACT IN AERS: 2.0000 | INHALATION_SPRAY | Freq: Four times a day (QID) | RESPIRATORY_TRACT | 12 refills | Status: DC | PRN

## 2016-11-30 NOTE — Assessment & Plan Note (Signed)
**Note De-Identified Corker Obfuscation** She remains significantly overweight. Her activity is limited even around the home. Counseling support.

## 2016-11-30 NOTE — Assessment & Plan Note (Signed)
**Note De-Identified Kissler Obfuscation** She needs a rescue inhaler as discussed, but usually manages pretty well using her nebulizer machine when at home I think she is near her baseline now having resolved a viral syndrome respiratory illness earlier this winter.

## 2016-11-30 NOTE — Assessment & Plan Note (Signed)
**Note De-Identified Hjort Obfuscation** She and her husband describes good compliance and control. She continues pressure at 13 with oxygen bleed in.

## 2016-11-30 NOTE — Progress Notes (Signed)
**Note De-Identified Shober Obfuscation** Patient ID: Hailey Hernandez, female    DOB: [birth year], 61 y.o.   MRN: [id number]  HPI female smoker followed for chronic bronchitis/COPD, chronic hypoxic respiratory failure, tobacco use (1 PPD/45 pack years), OSA, obesity hypoventilation, complicated by psoriasis O2 2-3 L sleep/exertion, CPAP 13/[facility]  -----------------------------------------------------------------------------------------  03/21/2016-61 year old female smoker followed for chronic bronchitis/COPD, chronic hypoxic respiratory failure, tobacco use (1 PPD/45 pack years), OSA, obesity hypoventilation, complicated by psoriasis O2 2-3 L sleep/exertion, CPAP 13/[facility] Husband here No acute concerns with her breathing. Daily cough is productive of white sputum. She has been on prednisone 10 mg daily from primary physician for gout after hurting ankle. Emphasis again on smoking cessation. She describes good compliance and control of OSA with CPAP.  11/30/2016-61 year old female smoker  followed for chronic bronchitis/COPD, chronic hypoxic respiratory failure, tobacco use (1 PPD/45 pack years), OSA, obesity hypoventilation, complicated by psoriasis O2 2-3 L sleep/exertion, CPAP 13/[facility]                   husband here Follows For: COPD, ok on 3L with exertion  She broke her left little toe and then dropped her oxygen tank on her left lower leg where there is already stasis dermatitis. Saw orthopedics. Had flu about 2 weeks ago with mild residual congestion but she does not feel that she needs additional intervention now. She is using her nebulizer machine as directed but does not have a rescue inhaler. Uses CPAP anytime she lies down and says is comfortable. Continues oxygen 2 L at rest but 3 L with any exertion  Review of Systems-see HPI Constitutional:   No-   weight loss, night sweats, fevers, chills, fatigue, lassitude. HEENT:    headaches, difficulty swallowing, tooth/dental problems, sore throat,        sneezing, itching,  ear  ache,  +nasal congestion, +post nasal drip,  CV:  No-   chest pain, orthopnea, PND, +swelling in lower extremities, anasarca,   dizziness, palpitations Resp: +  shortness of breath with exertion or at rest.              + productive cough,  + non-productive cough,  No- coughing up of blood.              No-   change in color of mucus.  + wheezing.   Skin: psoriasis GI:  No-   heartburn, indigestion, abdominal pain, nausea, vomiting,  GU: No-    MS:  + joint pain or swelling. . Neuro-     nothing unusual Psych:  No- change in mood or affect. No depression or anxiety.  No memory loss.   Objective:   Physical Exam General- Alert, Oriented, Affect-appropriate, Distress- none acute, obese. On 2L O2 sat 93% Skin- +extensive psoriasis plaques Lymphadenopathy- none Head- atraumatic            Eyes- Gross vision intact, PERRLA, conjunctivae clear secretions            Ears- Hearing, canals- cerumen/ debris R            Nose- + turbinate edema, no-Septal dev, mucus, polyps, erosion, perforation             Throat- Mallampati III , mucosa clear , drainage- none, tonsils- atrophic.                        +dentures Neck- flexible , trachea midline, no stridor , thyroid nl, carotid no bruit Chest - symmetrical excursion , **Note De-Identified Letarte Obfuscation** unlabored           Heart/CV- RRR , no murmur , no gallop  , no rub, nl s1 s2                           - JVD- none , edema- none, stasis dermatitis changes+ bilateral, varices- none           Lung- Diminished but quiet without rhonchi or wheeze, dullness-none, rub- none,  + shallow tachypnea, cough + raspy           Chest wall-  Abd-  Br/ Gen/ Rectal- Not done, not indicated Extrem- +apparent lipoma right medial knee with stasis changes, +open boot left foot Neuro- grossly intact to observation

## 2016-11-30 NOTE — Assessment & Plan Note (Signed)
**Note De-Identified Moncayo Obfuscation** She has not been making any significant effort to stop smoking. I encouraged both her and her husband make a real effort to quit together.

## 2016-11-30 NOTE — Assessment & Plan Note (Signed)
**Note De-Identified Selsor Obfuscation** Current oxygen setting is appropriate. Higher oxygen flows were likely to induce CO2 retention and her.

## 2016-11-30 NOTE — Patient Instructions (Signed)
**Note De-Identified Choquette Obfuscation** We can continue [employer] with O2 2-3L continuously, and CPAP 13 for sleep  Order- CXR   Dx COPD mixed type  Script sent for albuterol rescue inhaler to use 2 puffs every 4-6 hours if needed, when you are away from your home nebulizer machine. Keep on with the Symbicort 2 puffs, then rinse mouth, twice every day.  Please call if we can help

## 2016-12-04 ENCOUNTER — Ambulatory Visit
Admission: RE | Admit: 2016-12-04 | Discharge: 2016-12-04 | Disposition: A | Discharge status: Home / Self Care | Payer: Medicare Other | Source: Ambulatory Visit | Attending: Family Medicine | Admitting: Family Medicine

## 2016-12-04 DIAGNOSIS — Z1231 Encounter for screening mammogram for malignant neoplasm of breast: Secondary | ICD-10-CM

## 2016-12-09 DIAGNOSIS — J449 Chronic obstructive pulmonary disease, unspecified: Secondary | ICD-10-CM | POA: Diagnosis not present

## 2016-12-09 DIAGNOSIS — G4733 Obstructive sleep apnea (adult) (pediatric): Secondary | ICD-10-CM | POA: Diagnosis not present

## 2016-12-09 DIAGNOSIS — T85511A Breakdown (mechanical) of esophageal anti-reflux device, initial encounter: Secondary | ICD-10-CM | POA: Diagnosis not present

## 2016-12-09 DIAGNOSIS — L400 Psoriasis vulgaris: Secondary | ICD-10-CM | POA: Diagnosis not present

## 2016-12-13 ENCOUNTER — Encounter: Payer: Self-pay | Admitting: Family Medicine

## 2016-12-13 ENCOUNTER — Ambulatory Visit (INDEPENDENT_AMBULATORY_CARE_PROVIDER_SITE_OTHER): Payer: Medicare Other | Admitting: Family Medicine

## 2016-12-13 VITALS — BP 123/70 | HR 106 | Temp 97.7°F | Ht 64.0 in | Wt 243.0 lb

## 2016-12-13 DIAGNOSIS — I10 Essential (primary) hypertension: Secondary | ICD-10-CM | POA: Diagnosis not present

## 2016-12-13 DIAGNOSIS — E119 Type 2 diabetes mellitus without complications: Secondary | ICD-10-CM

## 2016-12-13 DIAGNOSIS — I872 Venous insufficiency (chronic) (peripheral): Secondary | ICD-10-CM | POA: Diagnosis not present

## 2016-12-13 DIAGNOSIS — L97822 Non-pressure chronic ulcer of other part of left lower leg with fat layer exposed: Secondary | ICD-10-CM

## 2016-12-13 MED ORDER — CEPHALEXIN 500 MG PO CAPS: 500.0000 mg | ORAL_CAPSULE | Freq: Three times a day (TID) | ORAL | 0 refills | Status: DC

## 2016-12-13 NOTE — Patient Instructions (Signed)
**Note De-Identified Stoy Obfuscation** Great to see you!  You will receive a call about a wound care appointment  Change the dressing I applied every 3 days.   Come back in 12 days if you do not have a good wound care appointment yet.

## 2016-12-13 NOTE — Progress Notes (Signed)
**Note De-identified Scheibel Obfuscation**   **Note De-Identified Mcgaugh Obfuscation** HPI  Patient presents today here to follow-up for leg wound, also to discuss diabetes.  Leg wound Patient had an injury, she was seen for what appeared to be a skin tear about 5 weeks ago. She states that the wound has not healed up at all Patient states that it drains clear to purulent yellow fluid, she's been treating it with Neosporin and a plain bandage.  Diabetes Diet controlled, no medications. Has not seen an eye doctor.   PMH: Smoking status noted ROS: Per HPI  Objective: BP 123/70   Pulse (!) 106   Temp 97.7 F (36.5 C) (Oral)   Ht '5\' 4"'$  (1.626 m)   Wt 243 lb (110.2 kg)   BMI 41.71 kg/m  Gen: NAD, alert, cooperative with exam HEENT: NCAT, EOMI, PERRL CV: RRR, good S1/S2, no murmur Resp: CTABL, no wheezes, non-labored Ext: No edema, warm Neuro: Alert and oriented, No gross deficits  Skin:  1.1 x 0.9 cm erosion with some fatty tissue apparent and granulation tissue on the left medial lower leg, no erythema, drainage, or fluctuance around the wound. She does have tenderness to palpation. Extensive hemosiderin staining bilaterally  Multiple lesions of plaque psoriasis  Diabetic Foot Exam - Simple   Simple Foot Form Diabetic Foot exam was performed with the following findings:  Yes 12/13/2016 12:03 PM  Visual Inspection No deformities, no ulcerations, no other skin breakdown bilaterally:  Yes Sensation Testing Intact to touch and monofilament testing bilaterally:  Yes Pulse Check Posterior Tibialis and Dorsalis pulse intact bilaterally:  Yes Comments Thickened yellow toenails throughout        Assessment and plan:  # Venous stasis ulcer Placed duoderm today, he has had serious troubles with venous stasis ulcers previously on the right lower extremity. Refer to wound care. Follow-up in 12 days unless she has good follow-up with wound care scheduled. Change dressing every 3 days Keflex with tenderness to ensure no active infection, no  risk of  abscess  # T2Dm Usually very well controlled with A1c of 5.2, no need for repeat A1c until 6 month mark, 2-3 months from now. Foot exam documented, ophthalmology referral placed  # Hypertension Well-controlled with diet alone No medications     Orders Placed This Encounter  Procedures  . Ambulatory referral to Ophthalmology    Referral Priority:   Routine    Referral Type:   Consultation    Referral Reason:   Specialty Services Required    Requested Specialty:   Ophthalmology    Number of Visits Requested:   1  . AMB referral to wound care center    Referral Priority:   Routine    Referral Type:   Consultation    Number of Visits Requested:   1    Meds ordered this encounter  Medications  . triamcinolone ointment (KENALOG) 0.1 %    Sig: Apply topically.  . Ascorbic Acid (VITAMIN C) 1000 MG tablet    Sig: Take 1,000 mg by mouth daily.  . colchicine 0.6 MG tablet    Sig: Take 0.6 mg by mouth daily.  Marland Kitchen aspirin EC 81 MG tablet    Sig: Take 81 mg by mouth daily.    Laroy Apple, MD [facility] 12/13/2016, 11:14 AM

## 2016-12-14 DIAGNOSIS — I129 Hypertensive chronic kidney disease with stage 1 through stage 4 chronic kidney disease, or unspecified chronic kidney disease: Secondary | ICD-10-CM | POA: Diagnosis not present

## 2016-12-17 DIAGNOSIS — Z79899 Other long term (current) drug therapy: Secondary | ICD-10-CM | POA: Diagnosis not present

## 2016-12-17 DIAGNOSIS — I878 Other specified disorders of veins: Secondary | ICD-10-CM | POA: Diagnosis not present

## 2016-12-17 DIAGNOSIS — Z5181 Encounter for therapeutic drug level monitoring: Secondary | ICD-10-CM | POA: Diagnosis not present

## 2016-12-17 DIAGNOSIS — L409 Psoriasis, unspecified: Secondary | ICD-10-CM | POA: Diagnosis not present

## 2016-12-17 DIAGNOSIS — L4050 Arthropathic psoriasis, unspecified: Secondary | ICD-10-CM | POA: Diagnosis not present

## 2016-12-17 DIAGNOSIS — S80922A Unspecified superficial injury of left lower leg, initial encounter: Secondary | ICD-10-CM | POA: Diagnosis not present

## 2016-12-21 ENCOUNTER — Ambulatory Visit: Payer: Medicare Other | Admitting: Family Medicine

## 2016-12-25 ENCOUNTER — Ambulatory Visit (HOSPITAL_COMMUNITY): Payer: Medicare Other | Attending: Family Medicine | Admitting: Physical Therapy

## 2016-12-25 DIAGNOSIS — L97221 Non-pressure chronic ulcer of left calf limited to breakdown of skin: Secondary | ICD-10-CM | POA: Diagnosis not present

## 2016-12-25 DIAGNOSIS — R262 Difficulty in walking, not elsewhere classified: Secondary | ICD-10-CM | POA: Diagnosis not present

## 2016-12-25 NOTE — Therapy (Signed)
**Note De-Identified [place] [facility]** [place], [place], [address] Phone: [phone]   Fax:  [phone]  Physical Therapy Evaluation  Patient Details  Name: Hailey Hernandez MRN: [id number] Date of Birth: 11/03/1955 Referring Provider: Kenn File  Encounter Date: 12/25/2016      PT End of Session - 12/25/16 1123    Visit Number 1   Number of Visits 4   Date for PT Re-Evaluation 01/24/17   Authorization Type UHC medicare   Authorization - Visit Number 1   Authorization - Number of Visits 4   PT Start Time 2376   PT Stop Time 1110   PT Time Calculation (min) 30 min   Activity Tolerance Patient tolerated treatment well   Behavior During Therapy [facility] for tasks assessed/performed      Past Medical History:  Diagnosis Date  . Allergy   . Anxiety disorder   . Arthritis   . Arthritis    bil legs  . Asthma   . Atrial fibrillation ([place])   . Cancer ([place])   . Chronic bronchitis   . Complication of anesthesia    20 yrs ago turned blue after surgery  . COPD (chronic obstructive pulmonary disease) ([place])   . Depression   . Diverticulitis   . DJD (degenerative joint disease)    spine  . Fibromyalgia   . GERD (gastroesophageal reflux disease)   . Glaucoma   . History of thyroid cancer   . HTN (hypertension)    no longer on BP medication  . Hyperlipidemia   . Impaired glucose tolerance 05/08/2013  . OSA (obstructive sleep apnea)    CPAP  last sleep study 8-10 yr.ag0  . Polymyalgia ([place])   . Psoriasis   . Rhinitis   . Shortness of breath   . Tachyarrhythmia   . Tobacco abuse     Past Surgical History:  Procedure Laterality Date  . APPENDECTOMY    . CARPAL TUNNEL RELEASE    . CARPAL TUNNEL RELEASE  10/30/2011   Procedure: CARPAL TUNNEL RELEASE;  Surgeon: Wynonia Sours, MD;  Location: [facility];  Service: Orthopedics;  Laterality: Right;  and mass excision  . CHOLECYSTECTOMY    . CHOLESTEATOMA EXCISION     right ear  .  left shoulder spurs    . ORIF right lower leg    . partial throidectomy    . TOTAL ABDOMINAL HYSTERECTOMY      There were no vitals filed for this visit.       Subjective Assessment - 12/25/16 1124    Subjective Hailey Hernandez is a 61 yo female who is on O2.   She states that six weeks ago an oxygen tank fell and caused a wound on the inside of her left LE.  She has tried neosporin but the wound does not  heal all the way.  She went to her MD who referred her to skilled physcial therapy.   She states that she bought compression hose but was unable to get them on because she broke her toe and has not tried to get them on after that.     Pertinent History COPD with O2 dependent, DM, renal failure, varicosities    How long can you sit comfortably? no problem    How long can you stand comfortably? not long   How long can you walk comfortably? uses a walker due to COPD she is unable to walk for long periods of time. **Note De-Identified [place] Obfuscation** Currently in Pain? No/denies  very painful to the touch 8/10 but no pain if her leg is left alone.             [facility] PT Assessment - 12/25/16 0001      Assessment   Medical Diagnosis Non healing wound   Referring Provider Kenn File   Onset Date/Surgical Date 11/13/16   Next MD Visit unknown   Prior Therapy none     Precautions   Precautions None     Restrictions   Weight Bearing Restrictions No     Balance Screen   Has the patient fallen in the past 6 months Yes   How many times? 3 or more   falls out of bed unsure how.    Has the patient had a decrease in activity level because of a fear of falling?  Yes   Is the patient reluctant to leave their home because of a fear of falling?  No     Home Ecologist residence     Prior Function   Level of Independence Independent with household mobility with device   Vocation Retired   Leisure sedentary     Cognition   Overall Cognitive Status Within Functional Limits for tasks  assessed     Observation/Other Assessments-Edema    Edema --  wound  medial aspect of LT LE 1.1x 1.0 cm 100% granulation      Sensation   Light Touch --  tender    Additional Comments noted varicosities B LE                    [facility] Adult PT Treatment/Exercise - 12/25/16 0001      Manual Therapy   Manual Therapy Edema management;Compression Bandaging   Compression Bandaging LE cleansed and lotioned followed by appilcation of silverhydrofiber to wound.  Profore lite applied from toes to knee.                  PT Education - 12/25/16 1122    Education provided Yes   Education Details Bandage should never be painful.  If increased pain then take layer by layer off.  Keep bandage dry; may use cast protector for showering.    Person(s) Educated Patient;Child(ren)   Methods Explanation   Comprehension Verbalized understanding          PT Short Term Goals - 12/25/16 1138      PT SHORT TERM GOAL #1   Title Pt pain to be no greater than a 4/10 to allow pt to ambulate for five minutes without increased pain to get to MD/ medical appointments.    Time 2   Period Weeks   Status New     PT SHORT TERM GOAL #2   Title Pt to understand why she need to where compression everyday with varicosities; ie increase risk of venous stasis ulcers, chronic issue,     Time 1   Period Weeks   Status New           PT Long Term Goals - 12/25/16 1143      PT LONG TERM GOAL #1   Title wound to be completely healed to decrease risk of infection.    Time 4   Period Weeks   Status New     PT LONG TERM GOAL #2   Title Pt family to be able to don compression garment.    Time 4   Period Weeks **Note De-Identified [place] Obfuscation** Status New               Plan - 12/25/16 1130    Clinical Impression Statement Hailey Hernandez is a 61 yo female who has a non-healing wound on her left LE.  She has significant varicosities therefore the therapist feels that healing is being delayed due poor venous return.  The  wound is granulated and draining moderately; drainage is serous.  No debridement needed but LE was bandaged using a multilayer profore lite bandage system.  Hailey Hernandez will benefit from skilled physical therapy to create a healing environment for her wound in order to decrease the risk of infection.     Rehab Potential Good   PT Frequency 1x / week   PT Duration 4 weeks   PT Treatment/Interventions ADLs/Self Care Home Management;Manual techniques  debridement if necessary.    PT Next Visit Plan Anticipate wound to be healed.  Pt has been instructed to bring compression garments if so educate pt on the importance of donning and wearing garments.  If Wound is not healed and pt tolerated profore lite begin profore compression bandaging.   Consulted and Agree with Plan of Care Patient      Patient will benefit from skilled therapeutic intervention in order to improve the following deficits and impairments:  Cardiopulmonary status limiting activity, Pain, Difficulty walking, Other (comment) (non-healing wound )  Visit Diagnosis: Non-pressure chronic ulcer of left calf, limited to breakdown of skin ([place]) - Plan: PT plan of care cert/re-cert  Difficulty in walking, not elsewhere classified - Plan: PT plan of care cert/re-cert      G-Codes - 15/17/61 1148    Functional Assessment Tool Used (Outpatient Only) clinical judgement:  wound size, time wound has been present, drainage   Functional Limitation Other PT primary   Other PT Primary Current Status (Y0737) At least 20 percent but less than 40 percent impaired, limited or restricted   Other PT Primary Goal Status (T0626) At least 1 percent but less than 20 percent impaired, limited or restricted       Problem List Patient Active Problem List   Diagnosis Date Noted  . Chronic respiratory failure with hypoxia ([place]) 11/30/2016  . T2DM (type 2 diabetes mellitus) ([place]) 05/18/2016  . Morbid obesity ([place]) 05/18/2016  . Polycythemia, secondary  06/10/2015  . Rhinitis, nonallergic 12/03/2014  . Peripheral vascular disease, unspecified 07/16/2013  . Leg ulcer ([place]) 07/13/2013  . Acute gouty arthritis 07/10/2013  . Bilateral leg pain 06/05/2013  . Bilateral knee pain 06/05/2013  . Left arm pain 06/05/2013  . Medial epicondylitis of left elbow 05/08/2013  . Peripheral edema 05/08/2013  . Abdominal swelling 11/07/2012  . Allergic conjunctivitis 11/07/2012  . Rotator cuff syndrome of right shoulder 07/23/2012  . Tendonitis, calcific, shoulder 07/23/2012  . Back pain 04/26/2012  . Cervical disc disease 04/25/2012  . Preventative health care 04/19/2012  . Tachyarrhythmia   . History of thyroid cancer   . Polymyalgia ([place])   . Diverticulitis   . DJD (degenerative joint disease)   . COPD mixed type ([place])   . Hyperlipidemia   . SYNCOPE 12/13/2010  . GERD 08/09/2010  . Other dysphagia 08/09/2010  . Plaque psoriasis 07/16/2010  . INSOMNIA 07/14/2010  . TOBACCO USER 02/09/2010  . Anxiety 12/30/2009  . Obstructive sleep apnea 12/30/2009  . CARPAL TUNNEL SYNDROME, BILATERAL 12/30/2009  . Essential hypertension 12/30/2009  . BREAST MASS, LEFT 12/30/2009  . Fibromyalgia 12/30/2009  . OSTEOPOROSIS 12/30/2009  . LIVER FUNCTION **Note De-Identified [place] Obfuscation** TESTS, ABNORMAL, HX OF 12/30/2009  . Obesity hypoventilation syndrome [facility]) 12/30/2009    Rayetta Humphrey, PT CLT [phone] 12/25/2016, 11:53 AM  [facility] [address] [place], [place], [address] Phone: [phone]   Fax:  [phone]  Name: Giselle Brutus Mireles MRN: [id number] Date of Birth: 14-Jan-1956

## 2016-12-31 ENCOUNTER — Ambulatory Visit (INDEPENDENT_AMBULATORY_CARE_PROVIDER_SITE_OTHER): Payer: Medicare Other | Admitting: Family Medicine

## 2016-12-31 ENCOUNTER — Encounter: Payer: Self-pay | Admitting: Family Medicine

## 2016-12-31 VITALS — BP 128/79 | HR 114 | Temp 97.1°F | Ht 64.0 in | Wt 241.4 lb

## 2016-12-31 DIAGNOSIS — R000 Tachycardia, unspecified: Secondary | ICD-10-CM

## 2016-12-31 DIAGNOSIS — M109 Gout, unspecified: Secondary | ICD-10-CM | POA: Diagnosis not present

## 2016-12-31 DIAGNOSIS — E119 Type 2 diabetes mellitus without complications: Secondary | ICD-10-CM

## 2016-12-31 LAB — BAYER DCA HB A1C WAIVED: HB A1C (BAYER DCA - WAIVED): 5.5 % (ref <7.0)

## 2016-12-31 MED ORDER — COLCHICINE 0.6 MG PO TABS: 0.6000 mg | ORAL_TABLET | Freq: Every day | ORAL | 3 refills | Status: DC

## 2016-12-31 NOTE — Progress Notes (Signed)
**Note De-identified Hailey Hernandez**   **Note De-Identified Campanaro Hernandez** HPI  Patient presents today here for follow-up of her leg wound, also this tachycardia.  Leg wound Patient has good follow-up with wound care. They have a follow-up tomorrow and are planning to rewrap her leg. She feels she's getting very good care.  She needs refill colchicine She takes this for gout  Tachycardia Patient states this is normal for her. She has severe lung disease on oxygen at baseline. No syncope or dizziness.  PMH: Smoking status noted ROS: Per HPI  Objective: BP 128/79   Pulse (!) 114   Temp 97.1 F (36.2 C) (Oral)   Ht '5\' 4"'$  (1.626 m)   Wt 241 lb 6.4 oz (109.5 kg)   BMI 41.44 kg/m  Gen: NAD, alert, cooperative with exam HEENT: NCAT, EOMI, PERRL CV: RRR, good S1/S2, no murmur Resp: CTABL, no wheezes, non-labored Abd: SNTND, BS present, no guarding or organomegaly Ext: No edema, warm Neuro: Alert and oriented, No gross deficits  EKG Sinus tach with some nonspecific changes unchanged from previous  Assessment and plan:  # Tachycardia Basic labs today, very low threshold for cardiology referral. Appears to be asymptomatic, rate has slowed somewhat after resting a little while in our clinic. No medications for now, however will likely refer to cardiology after labs are drawn  # Type 2 diabetes Previously controlled, unclear why she has not had good follow-up. A1c pending, basic labs as well.  Gout Patient also with psoriatic arthritis, unclear whether this is active or not Continue colchicine, refilled Recommended discussing this with her rheumatologist    Orders Placed This Encounter  Procedures  . CMP14+EGFR  . CBC with Differential/Platelet  . Lipid panel  . TSH  . Bayer DCA Hb A1c Waived  . EKG 12-Lead     Laroy Apple, MD [facility] 12/31/2016, 2:45 PM

## 2017-01-01 ENCOUNTER — Ambulatory Visit (HOSPITAL_COMMUNITY): Payer: Medicare Other | Attending: Family Medicine | Admitting: Physical Therapy

## 2017-01-01 ENCOUNTER — Telehealth (HOSPITAL_COMMUNITY): Payer: Self-pay | Admitting: Physical Therapy

## 2017-01-01 ENCOUNTER — Other Ambulatory Visit: Payer: Self-pay | Admitting: *Deleted

## 2017-01-01 ENCOUNTER — Ambulatory Visit (HOSPITAL_COMMUNITY): Payer: Medicare Other | Admitting: Physical Therapy

## 2017-01-01 DIAGNOSIS — R000 Tachycardia, unspecified: Secondary | ICD-10-CM

## 2017-01-01 DIAGNOSIS — R262 Difficulty in walking, not elsewhere classified: Secondary | ICD-10-CM | POA: Diagnosis not present

## 2017-01-01 DIAGNOSIS — L97221 Non-pressure chronic ulcer of left calf limited to breakdown of skin: Secondary | ICD-10-CM | POA: Diagnosis not present

## 2017-01-01 LAB — CBC WITH DIFFERENTIAL/PLATELET
BASOS ABS: 0 10*3/uL (ref 0.0–0.2)
Basos: 0 %
EOS (ABSOLUTE): 0.2 10*3/uL (ref 0.0–0.4)
Eos: 2 %
HEMATOCRIT: 46.8 % — AB (ref 34.0–46.6)
HEMOGLOBIN: 15.9 g/dL (ref 11.1–15.9)
IMMATURE GRANS (ABS): 0 10*3/uL (ref 0.0–0.1)
Immature Granulocytes: 0 %
LYMPHS ABS: 2.1 10*3/uL (ref 0.7–3.1)
Lymphs: 21 %
MCH: 32.7 pg (ref 26.6–33.0)
MCHC: 34 g/dL (ref 31.5–35.7)
MCV: 96 fL (ref 79–97)
MONOCYTES: 9 %
MONOS ABS: 0.8 10*3/uL (ref 0.1–0.9)
Neutrophils Absolute: 6.5 10*3/uL (ref 1.4–7.0)
Neutrophils: 68 %
Platelets: 227 10*3/uL (ref 150–379)
RBC: 4.86 x10E6/uL (ref 3.77–5.28)
RDW: 15.1 % (ref 12.3–15.4)
WBC: 9.7 10*3/uL (ref 3.4–10.8)

## 2017-01-01 LAB — LIPID PANEL
CHOL/HDL RATIO: 4.7 ratio — AB (ref 0.0–4.4)
CHOLESTEROL TOTAL: 179 mg/dL (ref 100–199)
HDL: 38 mg/dL — ABNORMAL LOW (ref >39)
LDL CALC: 88 mg/dL (ref 0–99)
TRIGLYCERIDES: 263 mg/dL — AB (ref 0–149)
VLDL CHOLESTEROL CAL: 53 mg/dL — AB (ref 5–40)

## 2017-01-01 LAB — CMP14+EGFR
A/G RATIO: 1.3 (ref 1.2–2.2)
ALBUMIN: 4.1 g/dL (ref 3.6–4.8)
ALK PHOS: 102 IU/L (ref 39–117)
ALT: 16 IU/L (ref 0–32)
AST: 20 IU/L (ref 0–40)
BILIRUBIN TOTAL: 0.4 mg/dL (ref 0.0–1.2)
BUN / CREAT RATIO: 14 (ref 12–28)
BUN: 9 mg/dL (ref 8–27)
CHLORIDE: 98 mmol/L (ref 96–106)
CO2: 30 mmol/L — ABNORMAL HIGH (ref 18–29)
Calcium: 10.6 mg/dL — ABNORMAL HIGH (ref 8.7–10.3)
Creatinine, Ser: 0.63 mg/dL (ref 0.57–1.00)
GFR calc Af Amer: 111 mL/min/{1.73_m2} (ref >59)
GFR calc non Af Amer: 96 mL/min/{1.73_m2} (ref >59)
GLUCOSE: 90 mg/dL (ref 65–99)
Globulin, Total: 3.1 g/dL (ref 1.5–4.5)
POTASSIUM: 4.1 mmol/L (ref 3.5–5.2)
Sodium: 145 mmol/L — ABNORMAL HIGH (ref 134–144)
Total Protein: 7.2 g/dL (ref 6.0–8.5)

## 2017-01-01 LAB — TSH: TSH: 1.34 u[IU]/mL (ref 0.450–4.500)

## 2017-01-01 NOTE — Therapy (Signed)
**Note De-Identified [place] [facility]** [place], [place], [address] Phone: [phone]   Fax:  [phone]  Wound Care Therapy  Patient Details  Name: Hailey Hernandez MRN: [id number] Date of Birth: 01/24/1956 Referring Provider: Kenn File  Encounter Date: 01/01/2017      PT End of Session - 01/01/17 1735    Visit Number 2   Number of Visits 4   Date for PT Re-Evaluation 01/24/17   Authorization Type UHC medicare   Authorization - Visit Number 2   Authorization - Number of Visits 4   PT Start Time 4580   PT Stop Time 1725   PT Time Calculation (min) 33 min   Activity Tolerance Patient tolerated treatment well   Behavior During Therapy [facility] for tasks assessed/performed      Past Medical History:  Diagnosis Date  . Allergy   . Anxiety disorder   . Arthritis   . Arthritis    bil legs  . Asthma   . Atrial fibrillation ([place])   . Cancer ([place])   . Chronic bronchitis   . Complication of anesthesia    20 yrs ago turned blue after surgery  . COPD (chronic obstructive pulmonary disease) ([place])   . Depression   . Diverticulitis   . DJD (degenerative joint disease)    spine  . Fibromyalgia   . GERD (gastroesophageal reflux disease)   . Glaucoma   . History of thyroid cancer   . HTN (hypertension)    no longer on BP medication  . Hyperlipidemia   . Impaired glucose tolerance 05/08/2013  . OSA (obstructive sleep apnea)    CPAP  last sleep study 8-10 yr.ag0  . Polymyalgia ([place])   . Psoriasis   . Rhinitis   . Shortness of breath   . Tachyarrhythmia   . Tobacco abuse     Past Surgical History:  Procedure Laterality Date  . APPENDECTOMY    . CARPAL TUNNEL RELEASE    . CARPAL TUNNEL RELEASE  10/30/2011   Procedure: CARPAL TUNNEL RELEASE;  Surgeon: Wynonia Sours, MD;  Location: [facility];  Service: Orthopedics;  Laterality: Right;  and mass excision  . CHOLECYSTECTOMY    . CHOLESTEATOMA EXCISION     right ear  . left  shoulder spurs    . ORIF right lower leg    . partial throidectomy    . TOTAL ABDOMINAL HYSTERECTOMY      There were no vitals filed for this visit.       Subjective Assessment - 01/01/17 1733    Subjective Pt with earlier appt today, however unable to make this appointment due to oxygen tank malfunctioning and had to reschedule to later in the day.  Pt brought by daughter this session and reports overall comfort with bandaging.                      [facility] Adult PT Treatment/Exercise - 01/01/17 0001      Manual Therapy   Manual Therapy Compression Bandaging;Other (comment)   Manual therapy comments LE cleansed and moisturized prior to rebandaging   Compression Bandaging xeroform used on wound f/b profore lite   Other Manual Therapy debridement to perimeter of wound to remove dry skin and eschar                PT Education - 01/01/17 1751    Education provided Yes   Education Details Purpose of compression garments and how **Note De-Identified [place]ic Obfuscation** these are beneficial to LE health.  Encouraged to begin wearing garment on Rt LE and bring LT garment in next session in anticipation wound will be healed.    Person(s) Educated Patient;Child(ren)   Methods Explanation   Comprehension Verbalized understanding          PT Short Term Goals - 12/25/16 1138      PT SHORT TERM GOAL #1   Title Pt pain to be no greater than a 4/10 to allow pt to ambulate for five minutes without increased pain to get to MD/ medical appointments.    Time 2   Period Weeks   Status New     PT SHORT TERM GOAL #2   Title Pt to understand why she need to where compression everyday with varicosities; ie increase risk of venous stasis ulcers, chronic issue,     Time 1   Period Weeks   Status New           PT Long Term Goals - 12/25/16 1143      PT LONG TERM GOAL #1   Title wound to be completely healed to decrease risk of infection.    Time 4   Period Weeks   Status New     PT LONG TERM GOAL #2    Title Pt family to be able to don compression garment.    Time 4   Period Weeks   Status New               Plan - 01/01/17 1735    Clinical Impression Statement Pt returns today with noted reduciton in swelling/induration.  Wound is now 1.0X0.8 cm without depth (was 1.1X1.0cm).  Wound is now with scant drainage and 90% granualated.  Changed dressing to xeroform to maintain moisture balance.  continued with profore lite as gives substantial compression and comfort.  Anticipate discharge next sesson.   Rehab Potential Good   PT Frequency 1x / week   PT Duration 4 weeks   PT Treatment/Interventions ADLs/Self Care Home Management;Manual techniques  debridement if necessary.    PT Next Visit Plan Anticipate wound to be healed next session.  Continue with debridement as needed followed by dressings to complement healing environment with use of compression.  Reminded again to bring her compression garments next session and to begin wearing a garment on her Rt LE.   Consulted and Agree with Plan of Care Patient      Patient will benefit from skilled therapeutic intervention in order to improve the following deficits and impairments:  Cardiopulmonary status limiting activity, Pain, Difficulty walking, Other (comment) (non-healing wound )  Visit Diagnosis: Non-pressure chronic ulcer of left calf, limited to breakdown of skin ([facility])  Difficulty in walking, not elsewhere classified     Problem List Patient Active Problem List   Diagnosis Date Noted  . Chronic respiratory failure with hypoxia ([place]) 11/30/2016  . T2DM (type 2 diabetes mellitus) ([place]) 05/18/2016  . Morbid obesity ([place]) 05/18/2016  . Polycythemia, secondary 06/10/2015  . Rhinitis, nonallergic 12/03/2014  . Peripheral vascular disease, unspecified 07/16/2013  . Leg ulcer ([place]) 07/13/2013  . Acute gouty arthritis 07/10/2013  . Bilateral leg pain 06/05/2013  . Bilateral knee pain 06/05/2013  . Left arm pain 06/05/2013   . Medial epicondylitis of left elbow 05/08/2013  . Peripheral edema 05/08/2013  . Abdominal swelling 11/07/2012  . Allergic conjunctivitis 11/07/2012  . Rotator cuff syndrome of right shoulder 07/23/2012  . Tendonitis, calcific, shoulder 07/23/2012  . Back pain **Note De-Identified [place] Obfuscation** 04/26/2012  . Cervical disc disease 04/25/2012  . Preventative health care 04/19/2012  . Tachyarrhythmia   . History of thyroid cancer   . Polymyalgia ([place])   . Diverticulitis   . DJD (degenerative joint disease)   . COPD mixed type ([place])   . Hyperlipidemia   . SYNCOPE 12/13/2010  . GERD 08/09/2010  . Other dysphagia 08/09/2010  . Plaque psoriasis 07/16/2010  . INSOMNIA 07/14/2010  . TOBACCO USER 02/09/2010  . Anxiety 12/30/2009  . Obstructive sleep apnea 12/30/2009  . CARPAL TUNNEL SYNDROME, BILATERAL 12/30/2009  . Essential hypertension 12/30/2009  . BREAST MASS, LEFT 12/30/2009  . Fibromyalgia 12/30/2009  . OSTEOPOROSIS 12/30/2009  . LIVER FUNCTION TESTS, ABNORMAL, HX OF 12/30/2009  . Obesity hypoventilation syndrome ([place]) 12/30/2009    Teena Irani, PTA/CLT [phone]  01/01/2017, 5:52 PM  [facility] [address] [place], [place], [address] Phone: [phone]   Fax:  [phone]  Name: Hailey Hernandez MRN: [id number] Date of Birth: Aug 16, 1956

## 2017-01-01 NOTE — Telephone Encounter (Signed)
**Note De-Identified [place] Obfuscation** A val broke on her Oxygen Tank and they were trying to fix it, she will not be able to come today. Rescheduled for Thursday

## 2017-01-03 ENCOUNTER — Ambulatory Visit (HOSPITAL_COMMUNITY): Payer: Medicare Other | Admitting: Physical Therapy

## 2017-01-03 NOTE — Progress Notes (Signed)
**Note De-Identified [place] Obfuscation** Referring-Dr Wendi Snipes Reason for referral-Tachycardia  HPI: 61 year old female who I'm asked to consult on by Dr. Wendi Snipes for tachycardia. Pt seen previously by Dr Percival Spanish but not since 04/02/12. Echocardiogram March 2012 was tech diff and showed ejection fraction 45-50%, mild left ventricular hypertrophy, rate 1 diastolic dysfunction, moderate mitral regurgitation and mild left atrial enlargement. Laboratories 4/18 showed TSH 1.34, Hgb 15.9 and K 4.1. Patient has chronic home O2 dependent COPD. She has chronic dyspnea on exertion but no orthopnea or PND. Mild pedal edema. She occasionally has pain in the left chest area. It is not exertional, pleuritic or positional. Lasts several seconds and resolves. She occasionally feels her heart pounding. No syncope.   Current Outpatient Prescriptions  Medication Sig Dispense Refill  . albuterol (PROVENTIL HFA;VENTOLIN HFA) 108 (90 Base) MCG/ACT inhaler Inhale 2 puffs into the lungs every 6 (six) hours as needed for wheezing or shortness of breath. 1 Inhaler 12  . albuterol (PROVENTIL) (2.5 MG/3ML) 0.083% nebulizer solution Take 3 mLs (2.5 mg total) by nebulization every 4 (four) hours as needed for wheezing or shortness of breath. DX: 496 180 vial 11  . Apremilast (OTEZLA PO) Take 60 mg by mouth daily. '30mg'$  In the morning and '30mg'$  at night.    . Ascorbic Acid (VITAMIN C) 1000 MG tablet Take 1,000 mg by mouth daily.    Marland Kitchen aspirin EC 81 MG tablet Take 81 mg by mouth daily.    Marland Kitchen atorvastatin (LIPITOR) 80 MG tablet TAKE 1 TABLET EVERY DAY 90 tablet 0  . budesonide-formoterol (SYMBICORT) 160-4.5 MCG/ACT inhaler Inhale 2 puffs into the lungs 2 (two) times daily. 1 Inhaler 2  . citalopram (CELEXA) 10 MG tablet Take 1 tablet (10 mg total) by mouth daily. 30 tablet 5  . clotrimazole (LOTRIMIN) 1 % cream APPLY AS DIRECTED BY YOUR PHYSICIAN. -FOR TOPICAL USE- 30 g 0  . colchicine 0.6 MG tablet Take 1 tablet (0.6 mg total) by mouth daily. 30 tablet 3  . fenofibrate  160 MG tablet Take 1 tablet (160 mg total) by mouth daily. 90 tablet 3  . folic acid (FOLVITE) 1 MG tablet Take 1 mg by mouth daily.     . methotrexate (RHEUMATREX) 2.5 MG tablet 25 mg once a week. On wednesday Caution:Chemotherapy. Protect from light.    . montelukast (SINGULAIR) 10 MG tablet Take 1 tablet (10 mg total) by mouth daily. 90 tablet 3  . Nebulizers (COMPRESSOR/NEBULIZER) MISC Use up to 4 times daily when needed 1 each 0  . triamcinolone ointment (KENALOG) 0.1 % Apply topically.     No current facility-administered medications for this visit.     Allergies  Allergen Reactions  . Doxycycline Shortness Of Breath and Other (See Comments)    Drug interaction with Soriatane  . Tramadol Shortness Of Breath    Did not work  . Milnacipran     REACTION: dizzy     Past Medical History:  Diagnosis Date  . Allergy   . Anxiety disorder   . Arthritis    bil legs  . Asthma   . Cancer ([place])   . Chronic bronchitis   . COPD (chronic obstructive pulmonary disease) ([place])   . Depression   . Diverticulitis   . DJD (degenerative joint disease)    spine  . Fibromyalgia   . GERD (gastroesophageal reflux disease)   . Glaucoma   . History of thyroid cancer   . HTN (hypertension)    no longer on BP medication  . **Note De-Identified [place] Obfuscation** Hyperlipidemia   . Impaired glucose tolerance 05/08/2013  . OSA (obstructive sleep apnea)    CPAP  last sleep study 8-10 yr.ag0  . Polymyalgia ([place])   . Psoriasis   . Rhinitis   . Tobacco abuse     Past Surgical History:  Procedure Laterality Date  . APPENDECTOMY    . CARPAL TUNNEL RELEASE  10/30/2011   Procedure: CARPAL TUNNEL RELEASE;  Surgeon: Wynonia Sours, MD;  Location: [facility];  Service: Orthopedics;  Laterality: Right;  and mass excision  . CHOLECYSTECTOMY    . CHOLESTEATOMA EXCISION     right ear  . left shoulder spurs    . ORIF right lower leg    . partial throidectomy    . TOTAL ABDOMINAL HYSTERECTOMY      Social History   Social  History  . Marital status: Married    Spouse name: N/A  . Number of children: 3  . Years of education: 9   Occupational History  . disabled   .  Disabled   Social History Main Topics  . Smoking status: Current Every Day Smoker    Packs/day: 1.00    Years: 45.00    Types: Cigarettes  . Smokeless tobacco: Never Used     Comment: pt states that she has chantix but is waiting until after seeing Dr. Oneida Alar before taking them  . Alcohol use No  . Drug use: No  . Sexual activity: Not on file   Other Topics Concern  . Not on file   Social History Narrative   Married w/ children   Daily caffeine use    Family History  Problem Relation Age of Onset  . Emphysema Sister   . Hypertension Sister   . Heart attack Sister   . Emphysema Sister   . Alpha-1 antitrypsin deficiency Sister   . Alpha-1 antitrypsin deficiency Sister   . Allergies Sister   . Asthma Sister   . Heart disease Mother   . Cancer Mother   . Hyperlipidemia Mother   . Hypertension Mother   . Heart attack Mother   . Heart disease Father   . Heart attack Father   . Cancer Sister   . Cancer Brother   . Hyperlipidemia Brother   . Hypertension Brother   . Tuberculosis Other     grandmother  . Hyperlipidemia Daughter   . Hypertension Daughter   . Hypertension Son     ROS: no fevers or chills, productive cough, hemoptysis, dysphasia, odynophagia, melena, hematochezia, dysuria, hematuria, rash, seizure activity, orthopnea, PND, claudication. Remaining systems are negative.  Physical Exam:   Blood pressure 136/72, pulse 94, height '5\' 4"'$  (1.626 m), weight 238 lb (108 kg).  General:  Well developed/obese, chronically ill appearing in NAD Skin psoriasis Patient not depressed No peripheral clubbing Back-normal HEENT-normal/normal eyelids Neck supple/normal carotid upstroke bilaterally; no bruits; no JVD; no thyromegaly chest - Diminished BS throughout CV - RRR/normal S1 and S2; no murmurs, rubs or gallops;   PMI nondisplaced Abdomen -NT/ND, no HSM, no mass, + bowel sounds, no bruit 2+ femoral pulses, no bruits Ext-trace edema, chronic skin changes 2+ DP on right; left leg wrapped Neuro-grossly nonfocal  ECG - 12/31/2016-sinus tachycardia, IVCD, cannot rule out prior septal infarct; personally reviewed  A/P  1 tachycardia-electrocardiogram shows sinus tachycardia. This is likely related to severe COPD and chronic hypoxemia. TSH and hemoglobin normal.   2 tobacco abuse-patient counseled on discontinuing.  3 history of moderate mitral regurgitation-plan repeat echocardiogram. **Note De-Identified [place] Obfuscation** 4 chest pain-symptoms are extremely atypical and previous electrocardiogram did not show ST changes. We will consider [place] nuclear study in the future if her symptoms worsen. She would not be a candidate for coronary artery bypass and graft but could possibly see if there was a lesion amenable to PCI.  5 hyperlipidemia-continue statin.  6 Hypertension-continue present blood pressure medications.  7 COPD-management per pulmonary.  Kirk Ruths, MD

## 2017-01-07 ENCOUNTER — Encounter: Payer: Self-pay | Admitting: Cardiology

## 2017-01-07 ENCOUNTER — Ambulatory Visit (INDEPENDENT_AMBULATORY_CARE_PROVIDER_SITE_OTHER): Payer: Medicare Other | Admitting: Cardiology

## 2017-01-07 VITALS — BP 136/72 | HR 94 | Ht 64.0 in | Wt 238.0 lb

## 2017-01-07 DIAGNOSIS — Z720 Tobacco use: Secondary | ICD-10-CM

## 2017-01-07 DIAGNOSIS — R072 Precordial pain: Secondary | ICD-10-CM

## 2017-01-07 DIAGNOSIS — I059 Rheumatic mitral valve disease, unspecified: Secondary | ICD-10-CM

## 2017-01-07 DIAGNOSIS — R000 Tachycardia, unspecified: Secondary | ICD-10-CM | POA: Diagnosis not present

## 2017-01-07 NOTE — Patient Instructions (Signed)
**Note De-identified [place] Obfuscation** Medication Instructions:   NO CHANGE  Testing/Procedures:  Your physician has requested that you have an echocardiogram. Echocardiography is a painless test that uses sound waves to create images of your heart. It provides your doctor with information about the size and shape of your heart and how well your heart's chambers and valves are working. This procedure takes approximately one hour. There are no restrictions for this procedure.    Follow-Up:  Your physician wants you to follow-up in: 6 MONTHS WITH DR CRENSHAW You will receive a reminder letter in the mail two months in advance. If you don't receive a letter, please call our office to schedule the follow-up appointment.    If you need a refill on your cardiac medications before your next appointment, please call your pharmacy.   

## 2017-01-08 ENCOUNTER — Ambulatory Visit (HOSPITAL_COMMUNITY): Payer: Medicare Other | Admitting: Physical Therapy

## 2017-01-08 DIAGNOSIS — L97221 Non-pressure chronic ulcer of left calf limited to breakdown of skin: Secondary | ICD-10-CM | POA: Diagnosis not present

## 2017-01-08 DIAGNOSIS — R262 Difficulty in walking, not elsewhere classified: Secondary | ICD-10-CM | POA: Diagnosis not present

## 2017-01-08 NOTE — Therapy (Signed)
**Note De-Identified Hailey Hernandez** Hailey Hernandez, [place], [address] Phone: [phone]   Fax:  [phone]  Wound Care Therapy  Patient Details  Name: Hailey Hernandez MRN: [id number] Date of Birth: Jul 13, 1956 Referring Provider: Kenn File  Encounter Date: 01/08/2017      PT End of Session - 01/08/17 1036    Visit Number 3   Number of Visits 4   Date for PT Re-Evaluation 01/24/17   Authorization Type UHC medicare   Authorization - Visit Number 3   Authorization - Number of Visits 4   PT Start Time [phone]   PT Stop Time 1030   PT Time Calculation (min) 40 min   Activity Tolerance Patient tolerated treatment well   Behavior During Therapy [facility] for tasks assessed/performed      Past Medical History:  Diagnosis Date  . Allergy   . Anxiety disorder   . Arthritis    bil legs  . Asthma   . Cancer ([place])   . Chronic bronchitis   . COPD (chronic obstructive pulmonary disease) ([place])   . Depression   . Diverticulitis   . DJD (degenerative joint disease)    spine  . Fibromyalgia   . GERD (gastroesophageal reflux disease)   . Glaucoma   . History of thyroid cancer   . HTN (hypertension)    no longer on BP medication  . Hyperlipidemia   . Impaired glucose tolerance 05/08/2013  . OSA (obstructive sleep apnea)    CPAP  last sleep study 8-10 yr.ag0  . Polymyalgia ([place])   . Psoriasis   . Rhinitis   . Tobacco abuse     Past Surgical History:  Procedure Laterality Date  . APPENDECTOMY    . CARPAL TUNNEL RELEASE  10/30/2011   Procedure: CARPAL TUNNEL RELEASE;  Surgeon: Wynonia Sours, MD;  Location: [facility];  Service: Orthopedics;  Laterality: Right;  and mass excision  . CHOLECYSTECTOMY    . CHOLESTEATOMA EXCISION     right ear  . left shoulder spurs    . ORIF right lower leg    . partial throidectomy    . TOTAL ABDOMINAL HYSTERECTOMY      There were no vitals filed for this visit.       Subjective Assessment -  01/08/17 1034    Subjective Pt states her Lt LE has been uncomfortable, sensitive.  Thinks it may be due to the bandages slipping down a little.  Pt traveled to the beach over the weekend so had her LE down alot.   Currently in Pain? No/denies                    [facility] Adult PT Treatment/Exercise - 01/08/17 0001      Manual Therapy   Manual Therapy Compression Bandaging;Other (comment)   Manual therapy comments Lt LE cleansed and moisturized prior to rebandaging   Compression Bandaging xeroform used on wound f/b profore regular   Other Manual Therapy debridement to perimeter of wound to remove dry skin and eschar                PT Education - 01/08/17 1040    Education provided Yes   Education Details compression garments:  Educated on importance of wearing, wear time with moisturizing at night and washing instructions.   Person(s) Educated Associate Professor)  grandson   Methods Explanation   Comprehension Verbalized understanding          PT Short **Note De-Identified Hailey Hernandez** Term Goals - 12/25/16 1138      PT SHORT TERM GOAL #1   Title Pt pain to be no greater than a 4/10 to allow pt to ambulate for five minutes without increased pain to get to MD/ medical appointments.    Time 2   Period Weeks   Status New     PT SHORT TERM GOAL #2   Title Pt to understand why she need to where compression everyday with varicosities; ie increase risk of venous stasis ulcers, chronic issue,     Time 1   Period Weeks   Status New           PT Long Term Goals - 12/25/16 1143      PT LONG TERM GOAL #1   Title wound to be completely healed to decrease risk of infection.    Time 4   Period Weeks   Status New     PT LONG TERM GOAL #2   Title Pt family to be able to don compression garment.    Time 4   Period Weeks   Status New               Plan - 01/08/17 1036    Clinical Impression Statement Pt brought her compression stockings this session (15-20 mmHg toe out).  Educated  on importance of wearing, wear time with moisturizing at night and washing instructions.   Instructed with donning on Rt LE; pt reported she would be unable to do this independently but has help at home.   Wound still not completely healed on Lt LE.  Cleansed and debrided away dry skin, moisturized and redressed with regular profore this session.  continued use of xeroform to maintain moisture.    Rehab Potential Good   PT Frequency 1x / week   PT Duration 4 weeks   PT Treatment/Interventions ADLs/Self Care Home Management;Manual techniques  debridement if necessary.    PT Next Visit Plan Anticipate wound to be healed next session.  Re-evaluate; pt is to bring her compression stocking in for her Lt LE.  Ensure she is continuing to wear her garment on her Rt LE and follow up with nail grooming.   Consulted and Agree with Plan of Care Patient      Patient will benefit from skilled therapeutic intervention in order to improve the following deficits and impairments:  Cardiopulmonary status limiting activity, Pain, Difficulty walking, Other (comment) (non-healing wound )  Visit Diagnosis: Non-pressure chronic ulcer of left calf, limited to breakdown of skin (HCC)  Difficulty in walking, not elsewhere classified     Problem List Patient Active Problem List   Diagnosis Date Noted  . Chronic respiratory failure with hypoxia ([place]) 11/30/2016  . T2DM (type 2 diabetes mellitus) ([place]) 05/18/2016  . Morbid obesity ([place]) 05/18/2016  . Polycythemia, secondary 06/10/2015  . Rhinitis, nonallergic 12/03/2014  . Peripheral vascular disease, unspecified 07/16/2013  . Leg ulcer ([place]) 07/13/2013  . Acute gouty arthritis 07/10/2013  . Bilateral leg pain 06/05/2013  . Bilateral knee pain 06/05/2013  . Left arm pain 06/05/2013  . Medial epicondylitis of left elbow 05/08/2013  . Peripheral edema 05/08/2013  . Abdominal swelling 11/07/2012  . Allergic conjunctivitis 11/07/2012  . Rotator cuff syndrome of  right shoulder 07/23/2012  . Tendonitis, calcific, shoulder 07/23/2012  . Back pain 04/26/2012  . Cervical disc disease 04/25/2012  . Preventative health care 04/19/2012  . Tachyarrhythmia   . History of thyroid cancer   . Polymyalgia ([place])   . **Note De-Identified [place] Hernandez** Diverticulitis   . DJD (degenerative joint disease)   . COPD mixed type ([place])   . Hyperlipidemia   . SYNCOPE 12/13/2010  . GERD 08/09/2010  . Other dysphagia 08/09/2010  . Plaque psoriasis 07/16/2010  . INSOMNIA 07/14/2010  . TOBACCO USER 02/09/2010  . Anxiety 12/30/2009  . Obstructive sleep apnea 12/30/2009  . CARPAL TUNNEL SYNDROME, BILATERAL 12/30/2009  . Essential hypertension 12/30/2009  . BREAST MASS, LEFT 12/30/2009  . Fibromyalgia 12/30/2009  . OSTEOPOROSIS 12/30/2009  . LIVER FUNCTION TESTS, ABNORMAL, HX OF 12/30/2009  . Obesity hypoventilation syndrome ([place]) 12/30/2009    Teena Irani, PTA/CLT [phone]  01/08/2017, 10:42 AM  [place], [place], [address] Phone: [phone]   Fax:  [phone]  Name: Noelle Sease Tayag MRN: [id number] Date of Birth: [birth year]

## 2017-01-09 DIAGNOSIS — T85511A Breakdown (mechanical) of esophageal anti-reflux device, initial encounter: Secondary | ICD-10-CM | POA: Diagnosis not present

## 2017-01-09 DIAGNOSIS — G4733 Obstructive sleep apnea (adult) (pediatric): Secondary | ICD-10-CM | POA: Diagnosis not present

## 2017-01-09 DIAGNOSIS — L400 Psoriasis vulgaris: Secondary | ICD-10-CM | POA: Diagnosis not present

## 2017-01-09 DIAGNOSIS — J449 Chronic obstructive pulmonary disease, unspecified: Secondary | ICD-10-CM | POA: Diagnosis not present

## 2017-01-15 ENCOUNTER — Ambulatory Visit (HOSPITAL_COMMUNITY): Payer: Medicare Other | Admitting: Physical Therapy

## 2017-01-15 DIAGNOSIS — L97221 Non-pressure chronic ulcer of left calf limited to breakdown of skin: Secondary | ICD-10-CM | POA: Diagnosis not present

## 2017-01-15 DIAGNOSIS — R262 Difficulty in walking, not elsewhere classified: Secondary | ICD-10-CM | POA: Diagnosis not present

## 2017-01-15 NOTE — Therapy (Signed)
**Note De-Identified [place] Obfuscation** [place], [place], [address] Phone: [phone]   Fax:  [phone]  Physical Therapy Treatment  Patient Details  Name: Hailey Hernandez MRN: [id number] Date of Birth: [birth year] Referring Provider: Kenn File  Encounter Date: 01/15/2017      PT End of Session - 01/15/17 1026    Visit Number 4   Number of Visits 4   Date for PT Re-Evaluation 01/24/17   Authorization Type UHC medicare   Authorization - Visit Number 4   Authorization - Number of Visits 4   PT Start Time [phone]   PT Stop Time 1022   PT Time Calculation (min) 32 min   Activity Tolerance Patient tolerated treatment well   Behavior During Therapy [facility] for tasks assessed/performed      Past Medical History:  Diagnosis Date  . Allergy   . Anxiety disorder   . Arthritis    bil legs  . Asthma   . Cancer ([place])   . Chronic bronchitis   . COPD (chronic obstructive pulmonary disease) ([place])   . Depression   . Diverticulitis   . DJD (degenerative joint disease)    spine  . Fibromyalgia   . GERD (gastroesophageal reflux disease)   . Glaucoma   . History of thyroid cancer   . HTN (hypertension)    no longer on BP medication  . Hyperlipidemia   . Impaired glucose tolerance 05/08/2013  . OSA (obstructive sleep apnea)    CPAP  last sleep study 8-10 yr.ag0  . Polymyalgia ([place])   . Psoriasis   . Rhinitis   . Tobacco abuse     Past Surgical History:  Procedure Laterality Date  . APPENDECTOMY    . CARPAL TUNNEL RELEASE  10/30/2011   Procedure: CARPAL TUNNEL RELEASE;  Surgeon: Wynonia Sours, MD;  Location: [facility];  Service: Orthopedics;  Laterality: Right;  and mass excision  . CHOLECYSTECTOMY    . CHOLESTEATOMA EXCISION     right ear  . left shoulder spurs    . ORIF right lower leg    . partial throidectomy    . TOTAL ABDOMINAL HYSTERECTOMY      There were no vitals filed for this visit.      Subjective Assessment -  01/15/17 1022    Subjective Hailey Hernandez states that she brought her compression socks.  States that they are the most comfortable things she has worn in years.  She has been wearing the sock on her right leg; but her husband is having difficulty donning them.    Pertinent History COPD with O2 dependent, DM, renal failure, varicosities    How long can you sit comfortably? no problem    How long can you stand comfortably? not long   How long can you walk comfortably? uses a walker due to COPD she is unable to walk for long periods of time.    Currently in Pain? Yes   Pain Score 2    Pain Location Leg   Pain Orientation Right;Left   Pain Descriptors / Indicators Aching  along B shin bones   Pain Onset More than a month ago   Pain Frequency Intermittent   Aggravating Factors  walking    Pain Relieving Factors elevating LE    Effect of Pain on Daily Activities increases             [facility] PT Assessment - 01/15/17 0001      Assessment **Note De-Identified Hailey Hernandez Obfuscation** Medical Diagnosis Non healing wound   Referring Provider Kenn File   Onset Date/Surgical Date 11/13/16   Next MD Visit unknown   Prior Therapy none     Precautions   Precautions None     Restrictions   Weight Bearing Restrictions No     Balance Screen   Has the patient fallen in the past 6 months Yes   How many times? 3   Has the patient had a decrease in activity level because of a fear of falling?  Yes   Is the patient reluctant to leave their home because of a fear of falling?  No     Home Ecologist residence     Prior Function   Level of Independence Independent with household mobility with device   Vocation Retired   Leisure sedentary     Cognition   Overall Cognitive Status Within Functional Limits for tasks assessed     Observation/Other Assessments-Edema    Edema --  wound:  healed was  1.1x 1.0 cm 100% granulation      Sensation   Light Touch --  tender    Additional Comments noted  varicosities B LE                              PT Education - 01/15/17 1024    Education provided Yes   Education Details Pt is SOB and breathing through her mouth.  Educated pt on diaphragmic breathing and why you want to breath this way [place] thru your mouth.  Educated that the more oxygen supply the body has the more that it can heal itself.  PT also educated on how to don compression garment.     Person(s) Educated Patient   Methods Explanation;Demonstration;Verbal cues   Comprehension Verbalized understanding;Returned demonstration  breathing but not donning of garment          PT Short Term Goals - 01/15/17 1030      PT SHORT TERM GOAL #1   Title Pt pain to be no greater than a 4/10 to allow pt to ambulate for five minutes without increased pain to get to MD/ medical appointments.    Time 2   Period Weeks   Status Achieved     PT SHORT TERM GOAL #2   Title Pt to understand why she need to where compression everyday with varicosities; ie increase risk of venous stasis ulcers, chronic issue,     Time 1   Period Weeks   Status Achieved           PT Long Term Goals - 01/15/17 1030      PT LONG TERM GOAL #1   Title wound to be completely healed to decrease risk of infection.    Time 4   Period Weeks   Status Achieved     PT LONG TERM GOAL #2   Title Pt family to be able to don compression garment.    Time 4   Period Weeks   Status Achieved               Plan - 01/15/17 1028    Clinical Impression Statement Wound healed.   Pt educated to cleanse LE using long handled sponge and moisturize every evening.  Pt educated in donning of compression garment and diaphragmic breathing.    Rehab Potential Good   PT Frequency 1x / week   PT Duration **Note De-Identified [place] Obfuscation** 4 weeks   PT Treatment/Interventions Patient/family education   PT Next Visit Plan Pt is ready for discharge    Consulted and Agree with Plan of Care Patient      Patient will benefit from  skilled therapeutic intervention in order to improve the following deficits and impairments:  Increased edema (nonhealing wound )  Visit Diagnosis: Non-pressure chronic ulcer of left calf, limited to breakdown of skin ([place])  Difficulty in walking, not elsewhere classified       G-Codes - 02-03-17 1031    Functional Assessment Tool Used (Outpatient Only) wound size    Functional Limitation Other PT primary   Other PT Primary Goal Status (H6579) At least 1 percent but less than 20 percent impaired, limited or restricted   Other PT Primary Discharge Status (U3833) At least 1 percent but less than 20 percent impaired, limited or restricted      Problem List Patient Active Problem List   Diagnosis Date Noted  . Chronic respiratory failure with hypoxia ([place]) 11/30/2016  . T2DM (type 2 diabetes mellitus) ([place]) 05/18/2016  . Morbid obesity ([place]) 05/18/2016  . Polycythemia, secondary 06/10/2015  . Rhinitis, nonallergic 12/03/2014  . Peripheral vascular disease, unspecified ([place]) 07/16/2013  . Leg ulcer ([place]) 07/13/2013  . Acute gouty arthritis 07/10/2013  . Bilateral leg pain 06/05/2013  . Bilateral knee pain 06/05/2013  . Left arm pain 06/05/2013  . Medial epicondylitis of left elbow 05/08/2013  . Peripheral edema 05/08/2013  . Abdominal swelling 11/07/2012  . Allergic conjunctivitis 11/07/2012  . Rotator cuff syndrome of right shoulder 07/23/2012  . Tendonitis, calcific, shoulder 07/23/2012  . Back pain 04/26/2012  . Cervical disc disease 04/25/2012  . Preventative health care 04/19/2012  . Tachyarrhythmia   . History of thyroid cancer   . Polymyalgia ([place])   . Diverticulitis   . DJD (degenerative joint disease)   . COPD mixed type ([place])   . Hyperlipidemia   . SYNCOPE 12/13/2010  . GERD 08/09/2010  . Other dysphagia 08/09/2010  . Plaque psoriasis 07/16/2010  . INSOMNIA 07/14/2010  . TOBACCO USER 02/09/2010  . Anxiety 12/30/2009  . Obstructive sleep apnea 12/30/2009  .  CARPAL TUNNEL SYNDROME, BILATERAL 12/30/2009  . Essential hypertension 12/30/2009  . BREAST MASS, LEFT 12/30/2009  . Fibromyalgia 12/30/2009  . OSTEOPOROSIS 12/30/2009  . LIVER FUNCTION TESTS, ABNORMAL, HX OF 12/30/2009  . Obesity hypoventilation syndrome [facility]) 12/30/2009    Rayetta Humphrey, PT CLT [phone] 03-Feb-2017, 10:31 AM  [place] [address] [place], [place], [address] Phone: [phone]   Fax:  [phone]  Name: Jacayla Nordell Hair MRN: [id number] Date of Birth: 02/19/1956   PHYSICAL THERAPY DISCHARGE SUMMARY  Visits from Start of Care: 4  Current functional level related to goals / functional outcomes: See above   Remaining deficits: varicosities   Education / Equipment: See above Plan: Patient agrees to discharge.  Patient goals were met. Patient is being discharged due to meeting the stated rehab goals.  ?????        Rayetta Humphrey, [place] CLT [phone]

## 2017-01-18 DIAGNOSIS — M19041 Primary osteoarthritis, right hand: Secondary | ICD-10-CM | POA: Diagnosis not present

## 2017-01-18 DIAGNOSIS — Z79899 Other long term (current) drug therapy: Secondary | ICD-10-CM | POA: Insufficient documentation

## 2017-01-18 DIAGNOSIS — L409 Psoriasis, unspecified: Secondary | ICD-10-CM | POA: Diagnosis not present

## 2017-01-18 DIAGNOSIS — M19042 Primary osteoarthritis, left hand: Secondary | ICD-10-CM | POA: Diagnosis not present

## 2017-01-18 DIAGNOSIS — J449 Chronic obstructive pulmonary disease, unspecified: Secondary | ICD-10-CM | POA: Diagnosis not present

## 2017-01-18 DIAGNOSIS — Z9981 Dependence on supplemental oxygen: Secondary | ICD-10-CM | POA: Diagnosis not present

## 2017-01-18 DIAGNOSIS — M797 Fibromyalgia: Secondary | ICD-10-CM | POA: Diagnosis not present

## 2017-01-22 ENCOUNTER — Ambulatory Visit (HOSPITAL_COMMUNITY): Payer: Medicare Other | Admitting: Physical Therapy

## 2017-01-25 ENCOUNTER — Other Ambulatory Visit: Payer: Self-pay

## 2017-01-25 ENCOUNTER — Ambulatory Visit (HOSPITAL_COMMUNITY): Payer: Medicare Other | Attending: Cardiology

## 2017-01-25 DIAGNOSIS — I358 Other nonrheumatic aortic valve disorders: Secondary | ICD-10-CM | POA: Diagnosis not present

## 2017-01-25 DIAGNOSIS — I059 Rheumatic mitral valve disease, unspecified: Secondary | ICD-10-CM | POA: Diagnosis not present

## 2017-01-25 DIAGNOSIS — I5030 Unspecified diastolic (congestive) heart failure: Secondary | ICD-10-CM | POA: Diagnosis not present

## 2017-01-25 DIAGNOSIS — I420 Dilated cardiomyopathy: Secondary | ICD-10-CM | POA: Insufficient documentation

## 2017-01-28 ENCOUNTER — Encounter: Payer: Self-pay | Admitting: Cardiology

## 2017-02-07 ENCOUNTER — Other Ambulatory Visit: Payer: Self-pay | Admitting: Internal Medicine

## 2017-02-08 DIAGNOSIS — G4733 Obstructive sleep apnea (adult) (pediatric): Secondary | ICD-10-CM | POA: Diagnosis not present

## 2017-02-08 DIAGNOSIS — T85511A Breakdown (mechanical) of esophageal anti-reflux device, initial encounter: Secondary | ICD-10-CM | POA: Diagnosis not present

## 2017-02-08 DIAGNOSIS — L400 Psoriasis vulgaris: Secondary | ICD-10-CM | POA: Diagnosis not present

## 2017-02-08 DIAGNOSIS — J449 Chronic obstructive pulmonary disease, unspecified: Secondary | ICD-10-CM | POA: Diagnosis not present

## 2017-02-27 ENCOUNTER — Other Ambulatory Visit: Payer: Self-pay | Admitting: Family Medicine

## 2017-02-27 ENCOUNTER — Other Ambulatory Visit: Payer: Self-pay | Admitting: Internal Medicine

## 2017-03-04 ENCOUNTER — Other Ambulatory Visit: Payer: Self-pay | Admitting: Internal Medicine

## 2017-03-04 ENCOUNTER — Other Ambulatory Visit: Payer: Self-pay | Admitting: Family Medicine

## 2017-03-11 DIAGNOSIS — L400 Psoriasis vulgaris: Secondary | ICD-10-CM | POA: Diagnosis not present

## 2017-03-11 DIAGNOSIS — G4733 Obstructive sleep apnea (adult) (pediatric): Secondary | ICD-10-CM | POA: Diagnosis not present

## 2017-03-11 DIAGNOSIS — J449 Chronic obstructive pulmonary disease, unspecified: Secondary | ICD-10-CM | POA: Diagnosis not present

## 2017-03-11 DIAGNOSIS — T85511A Breakdown (mechanical) of esophageal anti-reflux device, initial encounter: Secondary | ICD-10-CM | POA: Diagnosis not present

## 2017-03-13 ENCOUNTER — Encounter: Payer: Self-pay | Admitting: Internal Medicine

## 2017-03-15 ENCOUNTER — Other Ambulatory Visit: Payer: Self-pay

## 2017-03-15 DIAGNOSIS — J449 Chronic obstructive pulmonary disease, unspecified: Secondary | ICD-10-CM

## 2017-03-15 MED ORDER — ALBUTEROL SULFATE (2.5 MG/3ML) 0.083% IN NEBU: 2.5000 mg | INHALATION_SOLUTION | RESPIRATORY_TRACT | 11 refills | Status: DC | PRN

## 2017-03-25 DIAGNOSIS — Z79899 Other long term (current) drug therapy: Secondary | ICD-10-CM | POA: Diagnosis not present

## 2017-03-25 DIAGNOSIS — Z1159 Encounter for screening for other viral diseases: Secondary | ICD-10-CM | POA: Diagnosis not present

## 2017-03-25 DIAGNOSIS — L409 Psoriasis, unspecified: Secondary | ICD-10-CM | POA: Diagnosis not present

## 2017-03-25 DIAGNOSIS — Z792 Long term (current) use of antibiotics: Secondary | ICD-10-CM | POA: Diagnosis not present

## 2017-03-25 DIAGNOSIS — L4050 Arthropathic psoriasis, unspecified: Secondary | ICD-10-CM | POA: Diagnosis not present

## 2017-03-25 DIAGNOSIS — I776 Arteritis, unspecified: Secondary | ICD-10-CM | POA: Diagnosis not present

## 2017-03-25 DIAGNOSIS — Z5181 Encounter for therapeutic drug level monitoring: Secondary | ICD-10-CM | POA: Diagnosis not present

## 2017-03-25 DIAGNOSIS — I878 Other specified disorders of veins: Secondary | ICD-10-CM | POA: Diagnosis not present

## 2017-03-25 DIAGNOSIS — I8310 Varicose veins of unspecified lower extremity with inflammation: Secondary | ICD-10-CM | POA: Diagnosis not present

## 2017-03-26 ENCOUNTER — Encounter: Payer: Self-pay | Admitting: Internal Medicine

## 2017-03-27 ENCOUNTER — Encounter: Payer: Self-pay | Admitting: Physician Assistant

## 2017-03-27 ENCOUNTER — Ambulatory Visit (INDEPENDENT_AMBULATORY_CARE_PROVIDER_SITE_OTHER): Payer: Medicare Other | Admitting: Physician Assistant

## 2017-03-27 ENCOUNTER — Ambulatory Visit (INDEPENDENT_AMBULATORY_CARE_PROVIDER_SITE_OTHER): Payer: Medicare Other

## 2017-03-27 VITALS — BP 130/73 | HR 113 | Temp 97.9°F | Resp 16 | Ht 64.0 in | Wt 236.4 lb

## 2017-03-27 DIAGNOSIS — J449 Chronic obstructive pulmonary disease, unspecified: Secondary | ICD-10-CM

## 2017-03-27 DIAGNOSIS — J40 Bronchitis, not specified as acute or chronic: Secondary | ICD-10-CM | POA: Insufficient documentation

## 2017-03-27 MED ORDER — METHYLPREDNISOLONE ACETATE 80 MG/ML IJ SUSP
80.0000 mg | Freq: Once | INTRAMUSCULAR | Status: AC
  Administered 2017-03-27: 80 mg via INTRAMUSCULAR

## 2017-03-27 MED ORDER — CEFDINIR 300 MG PO CAPS: 300.0000 mg | ORAL_CAPSULE | Freq: Two times a day (BID) | ORAL | 0 refills | Status: DC

## 2017-03-27 NOTE — Patient Instructions (Signed)
**Note De-identified [place] Obfuscation** In a few days you may receive a survey in the mail or online from [employer] regarding your visit with us today. Please take a moment to fill this out. Your feedback is very important to our whole office. It can help us better understand your needs as well as improve your experience and satisfaction. Thank you for taking your time to complete it. We care about you.  Steele Ledonne, PA-C  

## 2017-03-29 NOTE — Progress Notes (Signed)
**Note De-Identified Moffet Obfuscation** BP 130/73   Pulse (!) 113   Temp 97.9 F (36.6 C) (Oral)   Resp 16   Ht 5\' 4"  (1.626 m)   Wt 236 lb 6.4 oz (107.2 kg)   SpO2 (!) 83%   BMI 40.58 kg/m    Subjective:    Patient ID: Hailey Hernandez, female    DOB: 15-May-1956, 61 y.o.   MRN: [id number]  HPI: Hailey Hernandez is a 61 y.o. female presenting on 03/27/2017 for Cough; Fatigue; greenish mucous; and Flank Pain (right side )  Patient with several days of progressing upper respiratory and bronchial symptoms. Initially there was more upper respiratory congestion. This progressed to having significant cough that is productive throughout the day and severe at night. There is occasional wheezing after coughing. Sometimes there is slight dyspnea on exertion. It is productive mucus that is yellow in color. Denies any blood.  Relevant past medical, surgical, family and social history reviewed and updated as indicated. Allergies and medications reviewed and updated.  Past Medical History:  Diagnosis Date  . Allergy   . Anxiety disorder   . Arthritis    bil legs  . Asthma   . Cancer ([place])   . Chronic bronchitis   . COPD (chronic obstructive pulmonary disease) ([place])   . Depression   . Diverticulitis   . DJD (degenerative joint disease)    spine  . Fibromyalgia   . GERD (gastroesophageal reflux disease)   . Glaucoma   . History of thyroid cancer   . HTN (hypertension)    no longer on BP medication  . Hyperlipidemia   . Impaired glucose tolerance 05/08/2013  . OSA (obstructive sleep apnea)    CPAP  last sleep study 8-10 yr.ag0  . Polymyalgia ([place])   . Psoriasis   . Rhinitis   . Tobacco abuse     Past Surgical History:  Procedure Laterality Date  . APPENDECTOMY    . CARPAL TUNNEL RELEASE  10/30/2011   Procedure: CARPAL TUNNEL RELEASE;  Surgeon: Wynonia Sours, MD;  Location: [facility];  Service: Orthopedics;  Laterality: Right;  and mass excision  . CHOLECYSTECTOMY    . CHOLESTEATOMA EXCISION     right ear   . left shoulder spurs    . ORIF right lower leg    . partial throidectomy    . TOTAL ABDOMINAL HYSTERECTOMY      Review of Systems  Constitutional: Positive for chills, fatigue and fever. Negative for activity change and appetite change.  HENT: Positive for congestion, postnasal drip and sore throat.   Eyes: Negative.   Respiratory: Positive for cough, shortness of breath and wheezing.   Cardiovascular: Negative.  Negative for chest pain, palpitations and leg swelling.  Gastrointestinal: Negative.   Genitourinary: Negative.   Musculoskeletal: Negative.   Skin: Negative.   Neurological: Positive for headaches.    Allergies as of 03/27/2017      Reactions   Doxycycline Shortness Of Breath, Other (See Comments)   Drug interaction with Soriatane   Tramadol Shortness Of Breath   Did not work   Milnacipran    REACTION: dizzy   Apremilast Rash   Rash, able to take this currently      Medication List       Accurate as of 03/27/17 11:59 PM. Always use your most recent med list.          albuterol 108 (90 Base) MCG/ACT inhaler Commonly known as:  PROVENTIL HFA;VENTOLIN HFA Inhale 2 **Note De-Identified Davie Obfuscation** puffs into the lungs every 6 (six) hours as needed for wheezing or shortness of breath.   albuterol (2.5 MG/3ML) 0.083% nebulizer solution Commonly known as:  PROVENTIL Take 3 mLs (2.5 mg total) by nebulization every 4 (four) hours as needed for wheezing or shortness of breath. DX: 496   aspirin EC 81 MG tablet Take 81 mg by mouth daily.   atorvastatin 80 MG tablet Commonly known as:  LIPITOR TAKE 1 TABLET EVERY DAY   budesonide-formoterol 160-4.5 MCG/ACT inhaler Commonly known as:  SYMBICORT Inhale 2 puffs into the lungs 2 (two) times daily.   cefdinir 300 MG capsule Commonly known as:  OMNICEF Take 1 capsule (300 mg total) by mouth 2 (two) times daily. 1 po BID   citalopram 10 MG tablet Commonly known as:  CELEXA Take 1 tablet (10 mg total) by mouth daily.   clotrimazole 1 %  cream Commonly known as:  LOTRIMIN APPLY AS DIRECTED BY YOUR PHYSICIAN. -FOR TOPICAL USE-   colchicine 0.6 MG tablet Take 1 tablet (0.6 mg total) by mouth daily.   Compressor/Nebulizer Misc Use up to 4 times daily when needed   fenofibrate 160 MG tablet Take 1 tablet (160 mg total) by mouth daily.   folic acid 1 MG tablet Commonly known as:  FOLVITE Take 1 mg by mouth daily.   methotrexate 2.5 MG tablet Commonly known as:  RHEUMATREX 25 mg once a week. On wednesday Caution:Chemotherapy. Protect from light.   montelukast 10 MG tablet Commonly known as:  SINGULAIR TAKE 1 TABLET EVERY DAY   triamcinolone ointment 0.1 % Commonly known as:  KENALOG Apply topically.   vitamin C 1000 MG tablet Take 1,000 mg by mouth daily.          Objective:    BP 130/73   Pulse (!) 113   Temp 97.9 F (36.6 C) (Oral)   Resp 16   Ht 5\' 4"  (1.626 m)   Wt 236 lb 6.4 oz (107.2 kg)   SpO2 (!) 83%   BMI 40.58 kg/m   Allergies  Allergen Reactions  . Doxycycline Shortness Of Breath and Other (See Comments)    Drug interaction with Soriatane  . Tramadol Shortness Of Breath    Did not work  . Milnacipran     REACTION: dizzy  . Apremilast Rash    Rash, able to take this currently    Physical Exam  Constitutional: She is oriented to person, place, and time. She appears well-developed and well-nourished.  HENT:  Head: Normocephalic and atraumatic.  Right Ear: There is drainage and tenderness.  Left Ear: There is drainage and tenderness.  Nose: Mucosal edema and rhinorrhea present. Right sinus exhibits no maxillary sinus tenderness and no frontal sinus tenderness. Left sinus exhibits no maxillary sinus tenderness and no frontal sinus tenderness.  Mouth/Throat: Posterior oropharyngeal erythema present.  Eyes: Conjunctivae and EOM are normal. Pupils are equal, round, and reactive to light.  Neck: Normal range of motion. Neck supple.  Cardiovascular: Normal rate, regular rhythm, normal  heart sounds and intact distal pulses.   Pulmonary/Chest: Effort normal. She has wheezes in the right upper field and the left upper field.  Abdominal: Soft. Bowel sounds are normal.  Neurological: She is alert and oriented to person, place, and time. She has normal reflexes.  Skin: Skin is warm and dry. No rash noted.  Psychiatric: She has a normal mood and affect. Her behavior is normal. Judgment and thought content normal. **Note De-Identified Vickrey Obfuscation** Assessment & Plan:   1. COPD mixed type (HCC) - cefdinir (OMNICEF) 300 MG capsule; Take 1 capsule (300 mg total) by mouth 2 (two) times daily. 1 po BID  Dispense: 20 capsule; Refill: 0 - methylPREDNISolone acetate (DEPO-MEDROL) injection 80 mg; Inject 1 mL (80 mg total) into the muscle once. - DG Chest 2 View; Future  2. Bronchitis - cefdinir (OMNICEF) 300 MG capsule; Take 1 capsule (300 mg total) by mouth 2 (two) times daily. 1 po BID  Dispense: 20 capsule; Refill: 0 - methylPREDNISolone acetate (DEPO-MEDROL) injection 80 mg; Inject 1 mL (80 mg total) into the muscle once. - DG Chest 2 View; Future Chest x-ray is positive for COPD changes but no active pneumonia.   Current Outpatient Prescriptions:  .  albuterol (PROVENTIL HFA;VENTOLIN HFA) 108 (90 Base) MCG/ACT inhaler, Inhale 2 puffs into the lungs every 6 (six) hours as needed for wheezing or shortness of breath., Disp: 1 Inhaler, Rfl: 12 .  albuterol (PROVENTIL) (2.5 MG/3ML) 0.083% nebulizer solution, Take 3 mLs (2.5 mg total) by nebulization every 4 (four) hours as needed for wheezing or shortness of breath. DX: 496, Disp: 180 vial, Rfl: 11 .  Ascorbic Acid (VITAMIN C) 1000 MG tablet, Take 1,000 mg by mouth daily., Disp: , Rfl:  .  aspirin EC 81 MG tablet, Take 81 mg by mouth daily., Disp: , Rfl:  .  atorvastatin (LIPITOR) 80 MG tablet, TAKE 1 TABLET EVERY DAY, Disp: 90 tablet, Rfl: 0 .  budesonide-formoterol (SYMBICORT) 160-4.5 MCG/ACT inhaler, Inhale 2 puffs into the lungs 2 (two) times daily., Disp:  1 Inhaler, Rfl: 2 .  citalopram (CELEXA) 10 MG tablet, Take 1 tablet (10 mg total) by mouth daily., Disp: 30 tablet, Rfl: 5 .  clotrimazole (LOTRIMIN) 1 % cream, APPLY AS DIRECTED BY YOUR PHYSICIAN. -FOR TOPICAL USE-, Disp: 30 g, Rfl: 0 .  colchicine 0.6 MG tablet, Take 1 tablet (0.6 mg total) by mouth daily., Disp: 30 tablet, Rfl: 3 .  fenofibrate 160 MG tablet, Take 1 tablet (160 mg total) by mouth daily., Disp: 90 tablet, Rfl: 3 .  folic acid (FOLVITE) 1 MG tablet, Take 1 mg by mouth daily. , Disp: , Rfl:  .  methotrexate (RHEUMATREX) 2.5 MG tablet, 25 mg once a week. On wednesday Caution:Chemotherapy. Protect from light., Disp: , Rfl:  .  montelukast (SINGULAIR) 10 MG tablet, TAKE 1 TABLET EVERY DAY, Disp: 90 tablet, Rfl: 0 .  Nebulizers (COMPRESSOR/NEBULIZER) MISC, Use up to 4 times daily when needed, Disp: 1 each, Rfl: 0 .  triamcinolone ointment (KENALOG) 0.1 %, Apply topically., Disp: , Rfl:  .  cefdinir (OMNICEF) 300 MG capsule, Take 1 capsule (300 mg total) by mouth 2 (two) times daily. 1 po BID, Disp: 20 capsule, Rfl: 0  Continue all other maintenance medications as listed above.  Follow up plan: Return if symptoms worsen or fail to improve.  Educational handout given for COPD bronchitis exacerbation  Terald Sleeper PA-C [facility] [address]  [place], [place] [address] [phone]   03/29/2017, 2:22 PM

## 2017-04-02 DIAGNOSIS — E119 Type 2 diabetes mellitus without complications: Secondary | ICD-10-CM | POA: Diagnosis not present

## 2017-04-02 DIAGNOSIS — Z961 Presence of intraocular lens: Secondary | ICD-10-CM | POA: Diagnosis not present

## 2017-04-02 DIAGNOSIS — H10413 Chronic giant papillary conjunctivitis, bilateral: Secondary | ICD-10-CM | POA: Diagnosis not present

## 2017-04-02 LAB — HM DIABETES EYE EXAM

## 2017-04-08 ENCOUNTER — Encounter: Payer: Self-pay | Admitting: Family Medicine

## 2017-04-08 ENCOUNTER — Ambulatory Visit (INDEPENDENT_AMBULATORY_CARE_PROVIDER_SITE_OTHER): Payer: Medicare Other | Admitting: Family Medicine

## 2017-04-08 VITALS — BP 136/75 | HR 94 | Temp 97.3°F | Ht 64.0 in | Wt 232.8 lb

## 2017-04-08 DIAGNOSIS — J449 Chronic obstructive pulmonary disease, unspecified: Secondary | ICD-10-CM

## 2017-04-08 DIAGNOSIS — M65311 Trigger thumb, right thumb: Secondary | ICD-10-CM

## 2017-04-08 DIAGNOSIS — J3089 Other allergic rhinitis: Secondary | ICD-10-CM | POA: Diagnosis not present

## 2017-04-08 DIAGNOSIS — F419 Anxiety disorder, unspecified: Secondary | ICD-10-CM

## 2017-04-08 DIAGNOSIS — G4733 Obstructive sleep apnea (adult) (pediatric): Secondary | ICD-10-CM | POA: Diagnosis not present

## 2017-04-08 DIAGNOSIS — G4730 Sleep apnea, unspecified: Secondary | ICD-10-CM | POA: Diagnosis not present

## 2017-04-08 MED ORDER — FLUTICASONE PROPIONATE 50 MCG/ACT NA SUSP: 2.0000 | Freq: Every day | NASAL | 6 refills | Status: DC

## 2017-04-08 MED ORDER — SERTRALINE HCL 50 MG PO TABS: 50.0000 mg | ORAL_TABLET | Freq: Every day | ORAL | 3 refills | Status: DC

## 2017-04-08 MED ORDER — UMECLIDINIUM BROMIDE 62.5 MCG/INH IN AEPB: 1.0000 | INHALATION_SPRAY | Freq: Every day | RESPIRATORY_TRACT | 11 refills | Status: DC

## 2017-04-08 MED ORDER — PREDNISONE 10 MG PO TABS: ORAL_TABLET | ORAL | 0 refills | Status: DC

## 2017-04-08 NOTE — Progress Notes (Signed)
**Note De-identified Brenes Obfuscation**   **Note De-Identified Quesnel Obfuscation** HPI  Patient presents today here for follow-up COPD and anxiety.  Anxiety Not helped by citalopram, suspect that this is causing somnolence. No SI. Not tolerate Cymbalta.  COPD Good medication compliance Still having some shortness of breath and wheezing. Omnicef did not seem to help.  Nasal congestion Chronic, has tried Zyrtec before. Does take Singulair.  No chest pain. No fever, chills, sweats. Tolerating food and fluids like usual.    PMH: Smoking status noted ROS: Per HPI  Objective: BP 136/75   Pulse 94   Temp (!) 97.3 F (36.3 C) (Oral)   Ht 5\' 4"  (1.626 m)   Wt 232 lb 12.8 oz (105.6 kg)   SpO2 (!) 89% Comment: with oxygen  BMI 39.96 kg/m  Gen: NAD, alert, cooperative with exam HEENT: NCAT, nares with swollen turbinates bilaterally CV: RRR, good S1/S2, no murmur Resp: CTABL, oxygen in place with nasal cannula, nonlabored, reasonable air movement, no added sounds Ext: No edema, warm Neuro: Alert and oriented, No gross deficits MSK Triggering of the right thumb  Assessment and plan:  # COPD with exacerbation At 12 day course of prednisone, patient has completed Omnicef. No added sounds on lung exam. Non-labored Add incruse- teaching performed  # Anxiety Change citalopram to Zoloft Follow-up approximate 3 weeks after starting Zoloft. Stop all medications for about 2 weeks to see if somnolence improves  # Allergic rhinitis Start Zyrtec, also start Flonase Flonase sent to the pharmacy.  # Sugar thumb of the right thumb Thumb is triggering on my exam, may be helped by prednisone. Could consider referral for injection   Meds ordered this encounter  Medications  . DISCONTD: citalopram (CELEXA) 10 MG tablet    Sig: Take 10 mg by mouth daily.  . sertraline (ZOLOFT) 50 MG tablet    Sig: Take 1 tablet (50 mg total) by mouth daily.    Dispense:  30 tablet    Refill:  3  . umeclidinium bromide (INCRUSE ELLIPTA) 62.5 MCG/INH AEPB    Sig: Inhale  1 puff into the lungs daily.    Dispense:  30 each    Refill:  11  . predniSONE (DELTASONE) 10 MG tablet    Sig: Take 4 pills a day for 3 days, then 3 pills a day for 3 days, then 2 pills a day for 3 days, then 1 pill a day for 3 days, then stop    Dispense:  30 tablet    Refill:  0  . fluticasone (FLONASE) 50 MCG/ACT nasal spray    Sig: Place 2 sprays into both nostrils daily.    Dispense:  16 g    Refill:  [place], MD [facility] 04/08/2017, 4:32 PM

## 2017-04-08 NOTE — Patient Instructions (Signed)
**Note De-Identified Trejos Obfuscation** Great to see you!  Come back in 4-6 weeks   Stop citalopram for 2 weeks, then start zoloft( I am hoping you see improvement in your sleepiness)   Start incruse 1 puff once daily for breathing, continue symbicort twice daily and albuterol as needed

## 2017-04-10 DIAGNOSIS — L400 Psoriasis vulgaris: Secondary | ICD-10-CM | POA: Diagnosis not present

## 2017-04-10 DIAGNOSIS — T85511A Breakdown (mechanical) of esophageal anti-reflux device, initial encounter: Secondary | ICD-10-CM | POA: Diagnosis not present

## 2017-04-10 DIAGNOSIS — J449 Chronic obstructive pulmonary disease, unspecified: Secondary | ICD-10-CM | POA: Diagnosis not present

## 2017-04-10 DIAGNOSIS — G4733 Obstructive sleep apnea (adult) (pediatric): Secondary | ICD-10-CM | POA: Diagnosis not present

## 2017-04-16 ENCOUNTER — Telehealth: Payer: Self-pay | Admitting: Family Medicine

## 2017-04-16 NOTE — Telephone Encounter (Signed)
**Note De-Identified Flener Obfuscation** Confirmed to [employer] that paperwork is on Dr [employer]

## 2017-05-11 DIAGNOSIS — J449 Chronic obstructive pulmonary disease, unspecified: Secondary | ICD-10-CM | POA: Diagnosis not present

## 2017-05-11 DIAGNOSIS — L400 Psoriasis vulgaris: Secondary | ICD-10-CM | POA: Diagnosis not present

## 2017-05-11 DIAGNOSIS — G4733 Obstructive sleep apnea (adult) (pediatric): Secondary | ICD-10-CM | POA: Diagnosis not present

## 2017-05-11 DIAGNOSIS — T85511A Breakdown (mechanical) of esophageal anti-reflux device, initial encounter: Secondary | ICD-10-CM | POA: Diagnosis not present

## 2017-05-20 DIAGNOSIS — Z5181 Encounter for therapeutic drug level monitoring: Secondary | ICD-10-CM | POA: Diagnosis not present

## 2017-05-20 DIAGNOSIS — L409 Psoriasis, unspecified: Secondary | ICD-10-CM | POA: Diagnosis not present

## 2017-05-20 DIAGNOSIS — I776 Arteritis, unspecified: Secondary | ICD-10-CM | POA: Diagnosis not present

## 2017-05-20 DIAGNOSIS — I872 Venous insufficiency (chronic) (peripheral): Secondary | ICD-10-CM | POA: Diagnosis not present

## 2017-05-20 DIAGNOSIS — E11621 Type 2 diabetes mellitus with foot ulcer: Secondary | ICD-10-CM | POA: Diagnosis not present

## 2017-05-21 ENCOUNTER — Encounter: Payer: Self-pay | Admitting: Family Medicine

## 2017-05-21 ENCOUNTER — Ambulatory Visit (INDEPENDENT_AMBULATORY_CARE_PROVIDER_SITE_OTHER): Payer: Medicare Other | Admitting: Family Medicine

## 2017-05-21 VITALS — BP 136/82 | HR 95 | Temp 98.1°F | Resp 20 | Ht 64.0 in | Wt 243.6 lb

## 2017-05-21 DIAGNOSIS — L400 Psoriasis vulgaris: Secondary | ICD-10-CM

## 2017-05-21 DIAGNOSIS — M65311 Trigger thumb, right thumb: Secondary | ICD-10-CM | POA: Diagnosis not present

## 2017-05-21 DIAGNOSIS — E119 Type 2 diabetes mellitus without complications: Secondary | ICD-10-CM

## 2017-05-21 DIAGNOSIS — L608 Other nail disorders: Secondary | ICD-10-CM | POA: Diagnosis not present

## 2017-05-21 DIAGNOSIS — F39 Unspecified mood [affective] disorder: Secondary | ICD-10-CM

## 2017-05-21 LAB — BAYER DCA HB A1C WAIVED: HB A1C: 5.5 % (ref <7.0)

## 2017-05-21 NOTE — Patient Instructions (Signed)
**Note De-Identified Renville Obfuscation** Great to see you!  Stop zoloft by taking 1/2 pill once daily for 1 week then stop

## 2017-05-21 NOTE — Progress Notes (Signed)
**Note De-identified Gilberti Obfuscation**   **Note De-Identified Balcerzak Obfuscation** HPI  Patient presents today here for follow-up of chronic medical conditions and right thumb pain.  Right thumb pain Discussed last month at our visit. Describes right thumb locking symptoms. She also has posterior right thumb pain in thenar pain. Previously had hand surgery from the hand center, would like to return there.  Diabetes Not really checking blood sugar, no medications needed.  Anxiety, mood disorder Patient reports history of being admitted, was told she had "manic depressive syndrome". States that Zoloft has also made her somnolent and Her "feel funny in her head". She states that whenever she was off of medications for 2 weeks she felt better and did not have any sleepiness. She slept well through the night. Denies any depression or anxiety currently.  Patient has stopped taking fenofibrate this week. She would like to see what her triglycerides do without medication.  PMH: Smoking status noted ROS: Per HPI  Objective: BP 136/82   Pulse 95   Temp 98.1 F (36.7 C) (Oral)   Resp 20   Ht 5\' 4"  (1.626 m)   Wt 243 lb 9.6 oz (110.5 kg)   SpO2 94% Comment: 3L O2  BMI 41.81 kg/m  Gen: NAD, alert, cooperative with exam HEENT: NCAT, CV: RRR, good S1/S2, no murmur Resp: CTABL, no wheezes, non-labored Ext: No edema, warm Neuro: Alert and oriented, No gross deficits  Diabetic Foot Exam - Simple   Simple Foot Form Diabetic Foot exam was performed with the following findings:  Yes 05/21/2017 11:45 AM  Visual Inspection See comments:  Yes Sensation Testing Intact to touch and monofilament testing bilaterally:  Yes Pulse Check Posterior Tibialis and Dorsalis pulse intact bilaterally:  Yes Comments Diffuse scale consistent with psoriasis Elongated thickened yellow toenails throughout      Depression screen [facility] 2/9 05/21/2017 04/08/2017 12/31/2016 12/13/2016 11/05/2016  Decreased Interest 0 0 0 0 0  Down, Depressed, Hopeless 0 0 0 0 0  PHQ - 2 Score 0 0 0 0 0    Skin:  Severe thick silvery scale on bilateral arms with multiple open lesions  Assessment and plan:  # Trigger finger of the right thumb Persistent symptoms, referring him surgery  # Toenail deformity Elongated thickened yellow toenails throughout, I believe she would have much difficulty getting these trimmed at home. Podiatry referral  # Type 2 diabetes A1c pending, however previously well controlled Diet controlled   # Mood disorder Primarily anxiety, has history of bipolar disorder years ago Patient states that she was better off without medication. Stop Zoloft, follow-up as needed, titrated 25 mg 8 days and stop  Plaque psoriasis Severe, patient is about to start monoclonal antibody with dermatology,[facility]    Orders Placed This Encounter  Procedures  . Ambulatory referral to Hand Surgery    Referral Priority:   Routine    Referral Type:   Surgical    Referral Reason:   Specialty Services Required    Requested Specialty:   Hand Surgery    Number of Visits Requested:   1    Meds ordered this encounter  Medications  . Brodalumab (SILIQ) 210 MG/1.5ML SOSY    Sig: Inject into the skin.    Laroy Apple, MD [facility] 05/21/2017, 11:17 AM

## 2017-05-23 ENCOUNTER — Encounter: Payer: Self-pay | Admitting: Family Medicine

## 2017-06-04 ENCOUNTER — Encounter: Payer: Self-pay | Admitting: Family Medicine

## 2017-06-07 ENCOUNTER — Ambulatory Visit (INDEPENDENT_AMBULATORY_CARE_PROVIDER_SITE_OTHER): Payer: Medicare Other | Admitting: Internal Medicine

## 2017-06-07 ENCOUNTER — Encounter: Payer: Self-pay | Admitting: Internal Medicine

## 2017-06-07 VITALS — BP 126/70 | HR 99 | Ht 64.0 in | Wt 252.6 lb

## 2017-06-07 DIAGNOSIS — J449 Chronic obstructive pulmonary disease, unspecified: Secondary | ICD-10-CM

## 2017-06-07 DIAGNOSIS — G4733 Obstructive sleep apnea (adult) (pediatric): Secondary | ICD-10-CM | POA: Diagnosis not present

## 2017-06-07 DIAGNOSIS — Z23 Encounter for immunization: Secondary | ICD-10-CM | POA: Diagnosis not present

## 2017-06-07 DIAGNOSIS — J9611 Chronic respiratory failure with hypoxia: Secondary | ICD-10-CM | POA: Diagnosis not present

## 2017-06-07 NOTE — Assessment & Plan Note (Signed)
**Note De-Identified Lavergne Obfuscation** She continues to benefit from CPAP and won't sleep without it. Download confirms 100% compliance averaging 9 hours per night with AHI 0.8/hour. Her machine is old and we will replace it if eligible, changing to AutoPap 10-15.

## 2017-06-07 NOTE — Progress Notes (Signed)
**Note De-Identified Lo Obfuscation** Patient ID: Hailey Hernandez, female    DOB: 1956/09/06, 61 y.o.   MRN: [id number]  HPI female smoker followed for chronic bronchitis/COPD, chronic hypoxic respiratory failure, tobacco use (1 PPD/45 pack years), OSA, obesity hypoventilation, complicated by psoriasis O2 2-3 L sleep/exertion, CPAP 13/[facility] Office Spirometry-06/07/17-severe obstructive airways disease, severe restriction of exhaled volume. FVC 1.18/37%, FEV1 0.90/36%, ratio 0.76, FEF 25-75% 0.73/33% -----------------------------------------------------------------------------------------  11/30/2016-61 year old female smoker  followed for chronic bronchitis/COPD, chronic hypoxic respiratory failure, tobacco use (1 PPD/45 pack years), OSA, obesity hypoventilation, complicated by psoriasis O2 2-3 L sleep/exertion, CPAP 13/[facility]                   husband here Follows For: COPD, ok on 3L with exertion  She broke her left little toe and then dropped her oxygen tank on her left lower leg where there is already stasis dermatitis. Saw orthopedics. Had flu about 2 weeks ago with mild residual congestion but she does not feel that she needs additional intervention now. She is using her nebulizer machine as directed but does not have a rescue inhaler. Uses CPAP anytime she lies down and says is comfortable. Continues oxygen 2 L at rest but 3 L with any exertion  06/07/17- 61 year old female smoker  followed for chronic bronchitis/COPD, chronic hypoxic respiratory failure, tobacco use (1 PPD/45 pack years), OSA, obesity hypoventilation, complicated by psoriasis/ MTX O2 2-3 L sleep/exertion, CPAP 13/[facility]                   sister here FOLLOWS FOR: Pt states she is doing fine as long as she paces herself and wears O2. Not gotten any worse.  Incruse, neb albuterol, albuterol HFA, Symbicort 160,  Her PCP added Incruse and Q a prednisone taper in the summer for "allergy". She feels at her baseline now, dependent on oxygen and with daily wheeze and  cough. Her psoriasis looks worse and when I asked if she sees a dermatologist, her sister had to remind her that she does. CXR 03/28/17 IMPRESSION: 1. Scattered bronchitic changes, consistent with history of COPD. 2. No other active cardiopulmonary disease. 3. Mild cardiomegaly without pulmonary edema. 4. Aortic atherosclerosis. Office Spirometry-06/07/17-severe obstructive airways disease, severe restriction of exhaled volume. FVC 1.18/37%, FEV1 0.90/36%, ratio 0.76, FEF 25-75% 0.73/33%  Review of Systems-see HPI   + = positive Constitutional:   No-   weight loss, night sweats, fevers, chills, fatigue, lassitude. HEENT:    headaches, difficulty swallowing, tooth/dental problems, sore throat,        sneezing, itching,  ear ache,  +nasal congestion, +post nasal drip,  CV:  No-   chest pain, orthopnea, PND, +swelling in lower extremities, anasarca,   dizziness, palpitations Resp: +  shortness of breath with exertion or at rest.              + productive cough,  + non-productive cough,  No- coughing up of blood.              No-   change in color of mucus.  + wheezing.   Skin: psoriasis GI:  No-   heartburn, indigestion, abdominal pain, nausea, vomiting,  GU: No-    MS:  + joint pain or swelling. . Neuro-     nothing unusual Psych:  No- change in mood or affect. No depression or anxiety.  No memory loss.   Objective:   Physical Exam General- Alert, Oriented, Affect-appropriate, Distress- none acute, obese. On 2L O2 sat 92% Skin- +extensive **Note De-Identified Hailey Hernandez Obfuscation** psoriasis plaques Lymphadenopathy- none Head- atraumatic            Eyes- Gross vision intact, PERRLA, conjunctivae clear secretions            Ears- Hearing, canals- cerumen/ debris R            Nose- + turbinate edema, no-Septal dev, mucus, polyps, erosion, perforation             Throat- Mallampati III , mucosa clear , drainage- none, tonsils- atrophic.                        +dentures Neck- flexible , trachea midline, no stridor , thyroid nl,  carotid no bruit Chest - symmetrical excursion , unlabored           Heart/CV- RRR , no murmur , no gallop  , no rub, nl s1 s2                           - JVD- none , edema- none, stasis dermatitis changes+ bilateral, varices- none           Lung- Diminished but quiet without rhonchi,  wheeze+ With laughter, dullness-none, rub- none,                                  + shallow tachypnea, cough + raspy           Chest wall-  Abd-  Br/ Gen/ Rectal- Not done, not indicated Extrem- +apparent lipoma right medial knee with stasis changes,  Neuro- grossly intact to observation

## 2017-06-07 NOTE — Assessment & Plan Note (Signed)
**Note De-Identified Runner Obfuscation** She remains dependent on oxygen. Complains of dryness from home concentrator and we may be able to help her by adding a humidifier. We discussed and updated Pneumovax-23. She had flu shot already.

## 2017-06-07 NOTE — Patient Instructions (Signed)
**Note De-Identified Archibeque Obfuscation** Ok to continue present meds    Order- DME [facility]- continue O2 2-3L continuous. Please add humidifier to home concentrator                                  If eligible, please replace old CPAP machine, change to auto 10-15, mask of choice, humidifier, supplies, AirView   Dx OSA  Order- FENO    Dx COPD mixed type              Office  Spirometry    Pneumovax 23

## 2017-06-10 ENCOUNTER — Other Ambulatory Visit: Payer: Self-pay | Admitting: Internal Medicine

## 2017-06-10 ENCOUNTER — Other Ambulatory Visit: Payer: Self-pay | Admitting: Family Medicine

## 2017-06-11 DIAGNOSIS — T85511A Breakdown (mechanical) of esophageal anti-reflux device, initial encounter: Secondary | ICD-10-CM | POA: Diagnosis not present

## 2017-06-11 DIAGNOSIS — J449 Chronic obstructive pulmonary disease, unspecified: Secondary | ICD-10-CM | POA: Diagnosis not present

## 2017-06-11 DIAGNOSIS — G4733 Obstructive sleep apnea (adult) (pediatric): Secondary | ICD-10-CM | POA: Diagnosis not present

## 2017-06-11 DIAGNOSIS — L400 Psoriasis vulgaris: Secondary | ICD-10-CM | POA: Diagnosis not present

## 2017-06-18 ENCOUNTER — Ambulatory Visit: Payer: Medicare Other | Admitting: Podiatry

## 2017-06-18 ENCOUNTER — Telehealth: Payer: Self-pay

## 2017-06-18 NOTE — Telephone Encounter (Signed)
**Note De-Identified Slemmer Obfuscation** We have discontinued  Laroy Apple, MD [facility] 06/18/2017, 10:03 AM

## 2017-06-18 NOTE — Telephone Encounter (Signed)
**Note De-Identified Gaal Obfuscation** [facility] sending over to fill Fenofibrate 160 mg qd  Do not see on med list

## 2017-06-19 ENCOUNTER — Other Ambulatory Visit: Payer: Self-pay | Admitting: Family Medicine

## 2017-06-21 DIAGNOSIS — M65311 Trigger thumb, right thumb: Secondary | ICD-10-CM | POA: Diagnosis not present

## 2017-06-28 ENCOUNTER — Encounter: Payer: Self-pay | Admitting: Podiatry

## 2017-06-28 ENCOUNTER — Ambulatory Visit (INDEPENDENT_AMBULATORY_CARE_PROVIDER_SITE_OTHER): Payer: Medicare Other | Admitting: Podiatry

## 2017-06-28 VITALS — BP 152/79 | HR 92 | Resp 18

## 2017-06-28 DIAGNOSIS — L603 Nail dystrophy: Secondary | ICD-10-CM

## 2017-06-28 DIAGNOSIS — M79676 Pain in unspecified toe(s): Secondary | ICD-10-CM | POA: Diagnosis not present

## 2017-06-28 DIAGNOSIS — L409 Psoriasis, unspecified: Secondary | ICD-10-CM

## 2017-06-28 NOTE — Progress Notes (Signed)
**Note De-Identified [place]gen [facility]** Subjective:  Patient ID: Hailey Hernandez, female    DOB: 09/25/1956,  MRN: [id number] HPI Chief Complaint  Patient presents with  . Debridement    Requesting nail care-patient also states that she tried to discuss psoriasis on plantar feet but wouldn't listen, says her feet hurt sometimes    61 y.o. female presents with the above complaint. He reports severe psoriasis not currently controlled with medications. Reports painfully thickened and elongated nails. Reports pain to the bottom of both feet with significant dry skin that she is unsure whether it is psoriasis. Denies other pedal issues  Past Medical History:  Diagnosis Date  . Allergy   . Anxiety disorder   . Arthritis    bil legs  . Asthma   . Cancer ([place])   . Chronic bronchitis   . COPD (chronic obstructive pulmonary disease) ([place])   . Depression   . Diverticulitis   . DJD (degenerative joint disease)    spine  . Fibromyalgia   . GERD (gastroesophageal reflux disease)   . Glaucoma   . History of thyroid cancer   . HTN (hypertension)    no longer on BP medication  . Hyperlipidemia   . Impaired glucose tolerance 05/08/2013  . OSA (obstructive sleep apnea)    CPAP  last sleep study 8-10 yr.ag0  . Polymyalgia ([place])   . Psoriasis   . Rhinitis   . Tobacco abuse    Past Surgical History:  Procedure Laterality Date  . APPENDECTOMY    . CARPAL TUNNEL RELEASE  10/30/2011   Procedure: CARPAL TUNNEL RELEASE;  Surgeon: Wynonia Sours, MD;  Location: [facility];  Service: Orthopedics;  Laterality: Right;  and mass excision  . CHOLECYSTECTOMY    . CHOLESTEATOMA EXCISION     right ear  . left shoulder spurs    . ORIF right lower leg    . partial throidectomy    . TOTAL ABDOMINAL HYSTERECTOMY      Current Outpatient Prescriptions:  .  albuterol (PROVENTIL HFA;VENTOLIN HFA) 108 (90 Base) MCG/ACT inhaler, Inhale 2 puffs into the lungs every 6 (six) hours as needed for wheezing or shortness of breath., Disp: 1  Inhaler, Rfl: 12 .  albuterol (PROVENTIL) (2.5 MG/3ML) 0.083% nebulizer solution, Take 3 mLs (2.5 mg total) by nebulization every 4 (four) hours as needed for wheezing or shortness of breath. DX: 496, Disp: 180 vial, Rfl: 11 .  aspirin EC 81 MG tablet, Take 81 mg by mouth daily., Disp: , Rfl:  .  atorvastatin (LIPITOR) 80 MG tablet, TAKE 1 TABLET EVERY DAY, Disp: 90 tablet, Rfl: 0 .  budesonide-formoterol (SYMBICORT) 160-4.5 MCG/ACT inhaler, Inhale 2 puffs into the lungs 2 (two) times daily., Disp: 1 Inhaler, Rfl: 2 .  colchicine 0.6 MG tablet, Take 1 tablet (0.6 mg total) by mouth daily., Disp: 30 tablet, Rfl: 3 .  fluticasone (FLONASE) 50 MCG/ACT nasal spray, Place 2 sprays into both nostrils daily., Disp: 16 g, Rfl: 6 .  folic acid (FOLVITE) 1 MG tablet, Take 1 mg by mouth daily. , Disp: , Rfl:  .  methotrexate (RHEUMATREX) 2.5 MG tablet, 25 mg once a week. On wednesday Caution:Chemotherapy. Protect from light., Disp: , Rfl:  .  montelukast (SINGULAIR) 10 MG tablet, TAKE 1 TABLET EVERY DAY, Disp: 90 tablet, Rfl: 1 .  Nebulizers (COMPRESSOR/NEBULIZER) MISC, Use up to 4 times daily when needed, Disp: 1 each, Rfl: 0 .  umeclidinium bromide (INCRUSE ELLIPTA) 62.5 MCG/INH AEPB, Inhale 1 puff into the **Note De-Identified Deguire Obfuscation** lungs daily., Disp: 30 each, Rfl: 11  Allergies  Allergen Reactions  . Doxycycline Shortness Of Breath and Other (See Comments)    Drug interaction with Soriatane  . Tramadol Shortness Of Breath    Did not work  . Milnacipran     REACTION: dizzy  . Apremilast Rash    Rash, able to take this currently   Review of Systems  HENT: Positive for hearing loss, sinus pain and sneezing.   Eyes: Positive for itching.  Musculoskeletal: Positive for arthralgias, back pain, gait problem and myalgias.  All other systems reviewed and are negative.  Objective:   Vitals:   06/28/17 1121  BP: (!) 152/79  Pulse: 92  Resp: 18   General AA&O x3. Normal mood and affect.  Vascular Dorsalis pedis and  posterior tibial pulses  present 2+ bilaterally  Capillary refill normal to all digits. Pedal hair growth normal.  Neurologic Epicritic sensation grossly present.  Dermatologic No open lesions. Interspaces clear of maceration. Nails elongated and thickened with yellow discoloration and some areas heading brittle texture  Erythematous plaques with silvery scale to the extensor surfaces of the forearms and the thigh. Scant plaque to the posterior aspect of the right calf. Xerosis of the plantar surface of both feet without silvery scaling plaques.  Orthopedic: MMT 5/5 in dorsiflexion, plantarflexion, inversion, and eversion. Normal joint ROM without pain or crepitus.    Assessment & Plan:  Patient was evaluated and treated and all questions answered.  Nail dystrophy/psoriatic nail disease -Nails debrided as below  Xerosis both feet -Educated that it is not likely psoriatic plaques rather it is likely just dry skin. Advise over-the-counter urea cream and pumice stone   No Follow-up on file.

## 2017-07-06 ENCOUNTER — Encounter: Payer: Self-pay | Admitting: Family Medicine

## 2017-07-06 DIAGNOSIS — F339 Major depressive disorder, recurrent, unspecified: Secondary | ICD-10-CM

## 2017-07-11 DIAGNOSIS — G4733 Obstructive sleep apnea (adult) (pediatric): Secondary | ICD-10-CM | POA: Diagnosis not present

## 2017-07-11 DIAGNOSIS — T85511A Breakdown (mechanical) of esophageal anti-reflux device, initial encounter: Secondary | ICD-10-CM | POA: Diagnosis not present

## 2017-07-11 DIAGNOSIS — L400 Psoriasis vulgaris: Secondary | ICD-10-CM | POA: Diagnosis not present

## 2017-07-11 DIAGNOSIS — J449 Chronic obstructive pulmonary disease, unspecified: Secondary | ICD-10-CM | POA: Diagnosis not present

## 2017-07-15 DIAGNOSIS — N181 Chronic kidney disease, stage 1: Secondary | ICD-10-CM | POA: Diagnosis not present

## 2017-07-15 DIAGNOSIS — R7302 Impaired glucose tolerance (oral): Secondary | ICD-10-CM | POA: Diagnosis not present

## 2017-07-15 DIAGNOSIS — R809 Proteinuria, unspecified: Secondary | ICD-10-CM | POA: Diagnosis not present

## 2017-07-15 DIAGNOSIS — E785 Hyperlipidemia, unspecified: Secondary | ICD-10-CM | POA: Diagnosis not present

## 2017-07-15 DIAGNOSIS — I129 Hypertensive chronic kidney disease with stage 1 through stage 4 chronic kidney disease, or unspecified chronic kidney disease: Secondary | ICD-10-CM | POA: Diagnosis not present

## 2017-07-19 DIAGNOSIS — M65311 Trigger thumb, right thumb: Secondary | ICD-10-CM | POA: Diagnosis not present

## 2017-08-02 ENCOUNTER — Encounter: Payer: Self-pay | Admitting: *Deleted

## 2017-08-02 DIAGNOSIS — I129 Hypertensive chronic kidney disease with stage 1 through stage 4 chronic kidney disease, or unspecified chronic kidney disease: Secondary | ICD-10-CM | POA: Diagnosis not present

## 2017-08-11 DIAGNOSIS — L400 Psoriasis vulgaris: Secondary | ICD-10-CM | POA: Diagnosis not present

## 2017-08-11 DIAGNOSIS — G4733 Obstructive sleep apnea (adult) (pediatric): Secondary | ICD-10-CM | POA: Diagnosis not present

## 2017-08-11 DIAGNOSIS — T85511A Breakdown (mechanical) of esophageal anti-reflux device, initial encounter: Secondary | ICD-10-CM | POA: Diagnosis not present

## 2017-08-11 DIAGNOSIS — J449 Chronic obstructive pulmonary disease, unspecified: Secondary | ICD-10-CM | POA: Diagnosis not present

## 2017-08-20 ENCOUNTER — Ambulatory Visit: Payer: Medicare Other | Admitting: Family Medicine

## 2017-08-23 ENCOUNTER — Ambulatory Visit: Payer: Medicare Other | Admitting: Family Medicine

## 2017-08-26 DIAGNOSIS — J449 Chronic obstructive pulmonary disease, unspecified: Secondary | ICD-10-CM | POA: Diagnosis not present

## 2017-08-26 DIAGNOSIS — Z5181 Encounter for therapeutic drug level monitoring: Secondary | ICD-10-CM | POA: Diagnosis not present

## 2017-08-26 DIAGNOSIS — E1122 Type 2 diabetes mellitus with diabetic chronic kidney disease: Secondary | ICD-10-CM | POA: Diagnosis not present

## 2017-08-26 DIAGNOSIS — I776 Arteritis, unspecified: Secondary | ICD-10-CM | POA: Diagnosis not present

## 2017-08-26 DIAGNOSIS — Z79899 Other long term (current) drug therapy: Secondary | ICD-10-CM | POA: Diagnosis not present

## 2017-08-26 DIAGNOSIS — I872 Venous insufficiency (chronic) (peripheral): Secondary | ICD-10-CM | POA: Diagnosis not present

## 2017-08-26 DIAGNOSIS — N189 Chronic kidney disease, unspecified: Secondary | ICD-10-CM | POA: Diagnosis not present

## 2017-08-26 DIAGNOSIS — E785 Hyperlipidemia, unspecified: Secondary | ICD-10-CM | POA: Diagnosis not present

## 2017-08-26 DIAGNOSIS — L409 Psoriasis, unspecified: Secondary | ICD-10-CM | POA: Diagnosis not present

## 2017-08-30 DIAGNOSIS — G4733 Obstructive sleep apnea (adult) (pediatric): Secondary | ICD-10-CM | POA: Diagnosis not present

## 2017-09-02 ENCOUNTER — Ambulatory Visit (INDEPENDENT_AMBULATORY_CARE_PROVIDER_SITE_OTHER): Payer: Medicare Other | Admitting: Family Medicine

## 2017-09-02 ENCOUNTER — Encounter: Payer: Self-pay | Admitting: Family Medicine

## 2017-09-02 VITALS — BP 135/81 | HR 112 | Temp 97.4°F | Ht 64.0 in | Wt 263.8 lb

## 2017-09-02 DIAGNOSIS — H9201 Otalgia, right ear: Secondary | ICD-10-CM | POA: Diagnosis not present

## 2017-09-02 DIAGNOSIS — M79674 Pain in right toe(s): Secondary | ICD-10-CM | POA: Diagnosis not present

## 2017-09-02 DIAGNOSIS — J449 Chronic obstructive pulmonary disease, unspecified: Secondary | ICD-10-CM

## 2017-09-02 MED ORDER — FLUTICASONE-UMECLIDIN-VILANT 100-62.5-25 MCG/INH IN AEPB: 1.0000 | INHALATION_SPRAY | Freq: Every day | RESPIRATORY_TRACT | 11 refills | Status: DC

## 2017-09-02 NOTE — Progress Notes (Signed)
**Note De-identified Saran [facility]**   **Note De-Identified Peron [facility]** HPI  Patient presents today here to discuss COPD and with right second toe pain and right ear pain.  Patient has history of right ear surgery and usually follows up with ENT once a year.  She states that she has had right ear pain now for 3 or 4 weeks.  She has considered following up with ENT but has not yet.  She has history of cholesteatoma of the right ear. Her hearing is reduced at baseline and worse only.  Right second toe pain Several weeks described as sensation of swelling when walking or having her feet up. No injury.  COPD Breathing is a little bit worse lately, patient states that she is out of Symbicort and increase, she states that she is in the donut hole and needs samples if we have them. Feels that she did much better on in cruise.  She had no problem using the inhaler  PMH: Smoking status noted ROS: Per HPI  Objective: BP 135/81   Pulse (!) 112   Temp (!) 97.4 F (36.3 C) (Oral)   Ht 5\' 4"  (1.626 m)   Wt 263 lb 12.8 oz (119.7 kg)   SpO2 92%   BMI 45.28 kg/m  Gen: NAD, alert, cooperative with exam HEENT: NCAT, oxygen [place]ane nasal cannula, right TM with thickening some scarring no infection CV: RRR, good S1/S2, no murmur Resp: CTABL, no wheezes, non-labored Ext: No edema, warm Neuro: Alert and oriented, No gross deficits Skin:  Right second toe with yellow hyperkeratotic skin lesion at the base of the toe, slightly tender to palpation, 2+ dorsalis pedis pulses bilaterally  Assessment and plan:  #COPD Severe, changing from Symbicort plus increased to trelogy which appears to be equally well covered by her insurance and is available today as a sample. 4 weeks samples given.   #Right toe pain Patient with what appears to be a symptomatic corn on the right second toe, recommended follow-up with podiatry  #Right ear pain Patient with complex medical history as it pertains to her ear, on exam there is no clear etiology for her ear pain, I do recommend ENT  follow-up    Meds ordered this encounter  Medications  . Fluticasone-Umeclidin-Vilant (TRELEGY ELLIPTA) 100-62.5-25 MCG/INH AEPB    Sig: Inhale 1 puff into the lungs daily.    Dispense:  1 each    Refill:  [place], MD [facility] 09/02/2017, 1:16 PM

## 2017-09-02 NOTE — Patient Instructions (Signed)
**Note De-Identified Sennett [facility]** Great to see you!  ! Puff once daily of trelegy will replace symbicort and incruse, I have sent a prescription.

## 2017-09-10 DIAGNOSIS — L400 Psoriasis vulgaris: Secondary | ICD-10-CM | POA: Diagnosis not present

## 2017-09-10 DIAGNOSIS — J449 Chronic obstructive pulmonary disease, unspecified: Secondary | ICD-10-CM | POA: Diagnosis not present

## 2017-09-10 DIAGNOSIS — T85511A Breakdown (mechanical) of esophageal anti-reflux device, initial encounter: Secondary | ICD-10-CM | POA: Diagnosis not present

## 2017-09-10 DIAGNOSIS — G4733 Obstructive sleep apnea (adult) (pediatric): Secondary | ICD-10-CM | POA: Diagnosis not present

## 2017-09-20 ENCOUNTER — Other Ambulatory Visit: Payer: Self-pay | Admitting: Family Medicine

## 2017-10-11 DIAGNOSIS — L400 Psoriasis vulgaris: Secondary | ICD-10-CM | POA: Diagnosis not present

## 2017-10-11 DIAGNOSIS — J449 Chronic obstructive pulmonary disease, unspecified: Secondary | ICD-10-CM | POA: Diagnosis not present

## 2017-10-11 DIAGNOSIS — T85511A Breakdown (mechanical) of esophageal anti-reflux device, initial encounter: Secondary | ICD-10-CM | POA: Diagnosis not present

## 2017-10-11 DIAGNOSIS — G4733 Obstructive sleep apnea (adult) (pediatric): Secondary | ICD-10-CM | POA: Diagnosis not present

## 2017-10-22 ENCOUNTER — Other Ambulatory Visit: Payer: Self-pay | Admitting: Family Medicine

## 2017-10-22 DIAGNOSIS — Z1231 Encounter for screening mammogram for malignant neoplasm of breast: Secondary | ICD-10-CM

## 2017-10-28 DIAGNOSIS — L409 Psoriasis, unspecified: Secondary | ICD-10-CM | POA: Diagnosis not present

## 2017-11-08 ENCOUNTER — Ambulatory Visit (INDEPENDENT_AMBULATORY_CARE_PROVIDER_SITE_OTHER): Payer: Medicare Other | Admitting: Family Medicine

## 2017-11-08 ENCOUNTER — Encounter: Payer: Self-pay | Admitting: Family Medicine

## 2017-11-08 VITALS — BP 139/83 | HR 115 | Temp 97.7°F | Ht 64.0 in | Wt 255.4 lb

## 2017-11-08 DIAGNOSIS — E119 Type 2 diabetes mellitus without complications: Secondary | ICD-10-CM | POA: Diagnosis not present

## 2017-11-08 DIAGNOSIS — J449 Chronic obstructive pulmonary disease, unspecified: Secondary | ICD-10-CM | POA: Diagnosis not present

## 2017-11-08 DIAGNOSIS — J441 Chronic obstructive pulmonary disease with (acute) exacerbation: Secondary | ICD-10-CM

## 2017-11-08 LAB — BAYER DCA HB A1C WAIVED: HB A1C: 5.7 % (ref <7.0)

## 2017-11-08 MED ORDER — LEVOFLOXACIN 750 MG PO TABS: 750.0000 mg | ORAL_TABLET | Freq: Every day | ORAL | 0 refills | Status: DC

## 2017-11-08 MED ORDER — PREDNISONE 20 MG PO TABS: ORAL_TABLET | ORAL | 0 refills | Status: DC

## 2017-11-08 NOTE — Patient Instructions (Signed)
**Note De-Identified Provencal [facility]** Great to see you!  If your symptoms are getting worse, you have chest pain, if you are having a hard time keeping up with your breathing, fever, or difficulty tolerating fluids please go to the hospital.

## 2017-11-08 NOTE — Progress Notes (Signed)
**Note De-identified Lewman [facility]**   **Note De-Identified Stgermain [facility]bfuscation** HPI  Patient presents today here for cough and shortness of breath.  Patient explains she has had cough and shortness of breath for about 2 weeks.  Started out with nasal congestion and mild cough.  She then began having worsening symptoms and started taking some old amoxicillin for about 1 week.   Diabetes Watching diet No medications. Nonfasting today   PMH: Smoking status noted ROS: Per HPI  Objective: BP 139/83   Pulse (!) 115   Temp 97.7 F (36.5 C) (Oral)   Ht '5\' 4"'$  (1.626 m)   Wt 255 lb 6.4 oz (115.8 kg)   SpO2 90%   BMI 43.84 kg/m  Gen: NAD, alert, cooperative with exam HEENT: NCAT, O2 in place [place], 4 L CV: RRR, good S1/S2, no murmur Resp: CTABL, no wheezes, non-labored Ext: No edema, warm Neuro: Alert and oriented, No gross deficits  Assessment and plan:  #COPD, COPD exacerbation Patient with increased shortness of breath, cough more productive of yellow thick sputum, and increased oxygen need with 4 L now above her usual baseline of 3 L. Breathing is comfortable today. Levaquin plus prednisone.  #Type 2 diabetes Previously very well controlled with diet only Labs today    Orders Placed This Encounter  Procedures  . CMP14+EGFR  . CBC with Differential/Platelet  . LDL Cholesterol, Direct  . TSH    Meds ordered this encounter  Medications  . levofloxacin (LEVAQUIN) 750 MG tablet    Sig: Take 1 tablet (750 mg total) by mouth daily.    Dispense:  10 tablet    Refill:  0  . predniSONE (DELTASONE) 20 MG tablet    Sig: 2 po at same time daily for 5 days    Dispense:  10 tablet    Refill:  0    Laroy Apple, MD [facility] 11/08/2017, 2:09 PM

## 2017-11-09 LAB — CBC WITH DIFFERENTIAL/PLATELET
BASOS: 0 %
Basophils Absolute: 0 10*3/uL (ref 0.0–0.2)
EOS (ABSOLUTE): 0.2 10*3/uL (ref 0.0–0.4)
EOS: 2 %
HEMATOCRIT: 54.8 % — AB (ref 34.0–46.6)
Hemoglobin: 17.7 g/dL — ABNORMAL HIGH (ref 11.1–15.9)
IMMATURE GRANULOCYTES: 0 %
Immature Grans (Abs): 0 10*3/uL (ref 0.0–0.1)
Lymphocytes Absolute: 1.4 10*3/uL (ref 0.7–3.1)
Lymphs: 18 %
MCH: 32.7 pg (ref 26.6–33.0)
MCHC: 32.3 g/dL (ref 31.5–35.7)
MCV: 101 fL — AB (ref 79–97)
Monocytes Absolute: 0.5 10*3/uL (ref 0.1–0.9)
Monocytes: 6 %
NEUTROS PCT: 74 %
Neutrophils Absolute: 5.6 10*3/uL (ref 1.4–7.0)
PLATELETS: 164 10*3/uL (ref 150–379)
RBC: 5.41 x10E6/uL — ABNORMAL HIGH (ref 3.77–5.28)
RDW: 16.7 % — AB (ref 12.3–15.4)
WBC: 7.7 10*3/uL (ref 3.4–10.8)

## 2017-11-09 LAB — CMP14+EGFR
A/G RATIO: 1.2 (ref 1.2–2.2)
ALBUMIN: 3.8 g/dL (ref 3.6–4.8)
ALK PHOS: 132 IU/L — AB (ref 39–117)
ALT: 17 IU/L (ref 0–32)
AST: 21 IU/L (ref 0–40)
BUN / CREAT RATIO: 14 (ref 12–28)
BUN: 10 mg/dL (ref 8–27)
Bilirubin Total: 0.6 mg/dL (ref 0.0–1.2)
CO2: 31 mmol/L — ABNORMAL HIGH (ref 20–29)
Calcium: 9.2 mg/dL (ref 8.7–10.3)
Chloride: 93 mmol/L — ABNORMAL LOW (ref 96–106)
Creatinine, Ser: 0.72 mg/dL (ref 0.57–1.00)
GFR calc Af Amer: 103 mL/min/{1.73_m2} (ref >59)
GFR, EST NON AFRICAN AMERICAN: 89 mL/min/{1.73_m2} (ref >59)
GLOBULIN, TOTAL: 3.3 g/dL (ref 1.5–4.5)
Glucose: 158 mg/dL — ABNORMAL HIGH (ref 65–99)
POTASSIUM: 4.4 mmol/L (ref 3.5–5.2)
Sodium: 142 mmol/L (ref 134–144)
Total Protein: 7.1 g/dL (ref 6.0–8.5)

## 2017-11-09 LAB — BRAIN NATRIURETIC PEPTIDE: BNP: 47.9 pg/mL (ref 0.0–100.0)

## 2017-11-09 LAB — LDL CHOLESTEROL, DIRECT: LDL Direct: 106 mg/dL — ABNORMAL HIGH (ref 0–99)

## 2017-11-09 LAB — TSH: TSH: 1.43 u[IU]/mL (ref 0.450–4.500)

## 2017-11-11 DIAGNOSIS — T85511A Breakdown (mechanical) of esophageal anti-reflux device, initial encounter: Secondary | ICD-10-CM | POA: Diagnosis not present

## 2017-11-11 DIAGNOSIS — J449 Chronic obstructive pulmonary disease, unspecified: Secondary | ICD-10-CM | POA: Diagnosis not present

## 2017-11-11 DIAGNOSIS — L400 Psoriasis vulgaris: Secondary | ICD-10-CM | POA: Diagnosis not present

## 2017-11-11 DIAGNOSIS — G4733 Obstructive sleep apnea (adult) (pediatric): Secondary | ICD-10-CM | POA: Diagnosis not present

## 2017-11-21 ENCOUNTER — Encounter: Payer: Self-pay | Admitting: Family Medicine

## 2017-11-22 MED ORDER — PREDNISONE 10 MG PO TABS: ORAL_TABLET | ORAL | 0 refills | Status: DC

## 2017-12-02 ENCOUNTER — Ambulatory Visit: Payer: Medicare Other | Admitting: Family Medicine

## 2017-12-02 ENCOUNTER — Other Ambulatory Visit: Payer: Self-pay | Admitting: *Deleted

## 2017-12-02 MED ORDER — FLUTICASONE-UMECLIDIN-VILANT 100-62.5-25 MCG/INH IN AEPB: 1.0000 | INHALATION_SPRAY | Freq: Every day | RESPIRATORY_TRACT | 11 refills | Status: DC

## 2017-12-02 NOTE — Telephone Encounter (Signed)
**Note De-Identified Dietzman [facility]bfuscation** symbicort has been replaced by trellegy.   Laroy Apple, MD [facility] 12/02/2017, 4:20 PM

## 2017-12-02 NOTE — Telephone Encounter (Signed)
**Note De-Identified Muellner [facility]** Fax received RF symbicort 160-4.5 mcg inh 10.20 gm 2 puffs BID Originally Rx by Dr. Edrick Oh

## 2017-12-02 NOTE — Addendum Note (Signed)
**Note De-Identified [name] [facility]** Addended by: Zannie Cove on: 12/02/2017 04:40 PM   Modules accepted: Orders

## 2017-12-03 ENCOUNTER — Encounter: Payer: Self-pay | Admitting: Family Medicine

## 2017-12-03 ENCOUNTER — Telehealth: Payer: Self-pay | Admitting: Internal Medicine

## 2017-12-03 DIAGNOSIS — J40 Bronchitis, not specified as acute or chronic: Secondary | ICD-10-CM

## 2017-12-03 DIAGNOSIS — J449 Chronic obstructive pulmonary disease, unspecified: Secondary | ICD-10-CM

## 2017-12-03 NOTE — Telephone Encounter (Signed)
**Note De-Identified Lei [facility]bfuscation** Pt is current with OV's and order has been placed.

## 2017-12-04 DIAGNOSIS — G4733 Obstructive sleep apnea (adult) (pediatric): Secondary | ICD-10-CM | POA: Diagnosis not present

## 2017-12-05 ENCOUNTER — Ambulatory Visit: Payer: Medicare Other

## 2017-12-06 ENCOUNTER — Ambulatory Visit: Payer: Medicare Other | Admitting: Internal Medicine

## 2017-12-06 ENCOUNTER — Ambulatory Visit (INDEPENDENT_AMBULATORY_CARE_PROVIDER_SITE_OTHER)
Admission: RE | Admit: 2017-12-06 | Discharge: 2017-12-06 | Disposition: A | Discharge status: Home / Self Care | Payer: Medicare Other | Source: Ambulatory Visit | Attending: Internal Medicine | Admitting: Internal Medicine

## 2017-12-06 ENCOUNTER — Encounter: Payer: Self-pay | Admitting: Internal Medicine

## 2017-12-06 VITALS — BP 192/112 | HR 123 | Ht 65.0 in | Wt 241.1 lb

## 2017-12-06 DIAGNOSIS — R000 Tachycardia, unspecified: Secondary | ICD-10-CM | POA: Diagnosis not present

## 2017-12-06 DIAGNOSIS — D751 Secondary polycythemia: Secondary | ICD-10-CM

## 2017-12-06 DIAGNOSIS — J441 Chronic obstructive pulmonary disease with (acute) exacerbation: Secondary | ICD-10-CM

## 2017-12-06 DIAGNOSIS — Z720 Tobacco use: Secondary | ICD-10-CM

## 2017-12-06 DIAGNOSIS — J449 Chronic obstructive pulmonary disease, unspecified: Secondary | ICD-10-CM | POA: Diagnosis not present

## 2017-12-06 DIAGNOSIS — R05 Cough: Secondary | ICD-10-CM | POA: Diagnosis not present

## 2017-12-06 DIAGNOSIS — J9611 Chronic respiratory failure with hypoxia: Secondary | ICD-10-CM | POA: Diagnosis not present

## 2017-12-06 MED ORDER — BUDESONIDE-FORMOTEROL FUMARATE 160-4.5 MCG/ACT IN AERO: INHALATION_SPRAY | RESPIRATORY_TRACT | 12 refills | Status: DC

## 2017-12-06 NOTE — Assessment & Plan Note (Signed)
**Note De-Identified [place] [facility]** She is counseled at every visit but makes no firm effort to quit smoking. Admits she has Chantix but has chosen not to take it.

## 2017-12-06 NOTE — Assessment & Plan Note (Signed)
**Note De-Identified Wysocki [facility]uscation** EKG done in sitting position indicates sinus rhythm.  Need to watch cardiac status.

## 2017-12-06 NOTE — Assessment & Plan Note (Signed)
**Note De-Identified Banes [facility]usc[facility]** Polycythemia likely reflects her hypoxia, but there may be a P vera component since she required myotomy several times years ago.

## 2017-12-06 NOTE — Assessment & Plan Note (Signed)
**Note De-Identified Lykens [facility]bfuscation** Refilling Symbicort.  Hopefully she is resolving recent exacerbation.

## 2017-12-06 NOTE — Progress Notes (Signed)
**Note De-Identified Reuss [facility]** Patient ID: Hailey Hernandez, female    DOB: Apr 02, 1956, 62 y.o.   MRN: [id number]  HPI female smoker followed for chronic bronchitis/COPD, chronic hypoxic respiratory failure, tobacco use (1 PPD/45 pack years), OSA, obesity hypoventilation, complicated by psoriasis O2 2-3 L sleep/exertion, CPAP 13/[facility] Office Spirometry-06/07/17-severe obstructive airways disease, severe restriction of exhaled volume. FVC 1.18/37%, FEV1 0.90/36%, ratio 0.76, FEF 25-75% 0.73/33% -----------------------------------------------------------------------------------------  06/07/17- 62 year old female smoker  followed for chronic bronchitis/COPD, chronic hypoxic respiratory failure, tobacco use (1 PPD/45 pack years), OSA, obesity hypoventilation, complicated by psoriasis/ MTX O2 2-3 L sleep/exertion, CPAP 13/[facility]                   sister here FOLLOWS FOR: Pt states she is doing fine as long as she paces herself and wears O2. Not gotten any worse.  Incruse, neb albuterol, albuterol HFA, Symbicort 160,  Her PCP added Incruse and Q a prednisone taper in the summer for "allergy". She feels at her baseline now, dependent on oxygen and with daily wheeze and cough. Her psoriasis looks worse and when I asked if she sees a dermatologist, her sister had to remind her that she does. CXR 03/28/17 IMPRESSION: 1. Scattered bronchitic changes, consistent with history of COPD. 2. No other active cardiopulmonary disease. 3. Mild cardiomegaly without pulmonary edema. 4. Aortic atherosclerosis. Office Spirometry-06/07/17-severe obstructive airways disease, severe restriction of exhaled volume. FVC 1.18/37%, FEV1 0.90/36%, ratio 0.76, FEF 25-75% 0.73/33%  12/06/17- 62 year old female smoker  followed for chronic bronchitis/COPD, chronic hypoxic respiratory failure, tobacco use (1 PPD/45 pack years), OSA, obesity hypoventilation, complicated by psoriasis/ MTX ----Been sick for about a month productive cough yellow in color. No fever noted.   Continues to smoke against advice despite a great deal of counseling and effort. O2 2-3 L sleep/exertion, CPAP 13/[facility]  Arrival sat 80% on 3L, 93% on 4L. BP 192/112, HR 123. EKG done sitting in chair for rhythm- SinusTach. NSSTWC , partial AVB Took antibiotic and 1.round of prednisone.  Quit halfway through a second prednisone taper because it made her nervous and raised her heart rate.  Denies blood, chest pain, palpitation.  Sputum now white Noted polycythemia hemoglobin 17.  She declined referral and describes needing phlebotomies for polycythemia several years ago. Using rescue inhaler 2 or 3 times daily, nebulizer up to 4 times daily when sick, now down to once or twice daily.  Asked refill Symbicort.  Review of Systems-see HPI   + = positive Constitutional:   No-   weight loss, night sweats, fevers, chills, fatigue, lassitude. HEENT:    headaches, difficulty swallowing, tooth/dental problems, sore throat,        sneezing, itching,  ear ache,  +nasal congestion, +post nasal drip,  CV:  No-   chest pain, orthopnea, PND, +swelling in lower extremities, anasarca,   dizziness, palpitations Resp: +  shortness of breath with exertion or at rest.              + productive cough,  + non-productive cough,  No- coughing up of blood.              No-   change in color of mucus.  + wheezing.   Skin: psoriasis GI:  No-   heartburn, indigestion, abdominal pain, nausea, vomiting,  GU: No-    MS:  + joint pain or swelling. . Neuro-     nothing unusual Psych:  No- change in mood or affect. No depression or anxiety.  No memory loss. **Note De-Identified Guizar [facility]bfuscation** Objective:   Physical Exam General- Alert, Oriented, Affect-appropriate, Distress- none acute, obese. On 2L O2 sat 92% Skin- +extensive psoriasis plaques Lymphadenopathy- none Head- atraumatic            Eyes- Gross vision intact, PERRLA, conjunctivae clear secretions            Ears- Hearing, canals- cerumen/ debris R            Nose- + turbinate edema, no-Septal  dev, mucus, polyps, erosion, perforation             Throat- Mallampati III , mucosa clear , drainage- none, tonsils- atrophic.                        +dentures Neck- flexible , trachea midline, no stridor , thyroid nl, carotid no bruit Chest - symmetrical excursion , unlabored           Heart/CV- RRR , no murmur , no gallop  , no rub, nl s1 s2                           - JVD- none , edema- none, stasis dermatitis changes+ bilateral,                                 varices- none           Lung- Diminished but quiet without rhonchi,  Wheeze- none, dullness-none,                           rub- none, + shallow tachypnea, cough + raspy           Chest wall-  Abd-  Br/ Gen/ Rectal- Not done, not indicated Extrem- +apparent lipoma right medial knee with stasis changes,  Neuro- grossly intact to observation

## 2017-12-06 NOTE — Patient Instructions (Addendum)
**Note De-Identified Evitts [facility]bfuscation** Keep O2 at 4L for now    We would like to stay in range 88 to 94% most of the time.  Order- EKG    Dx tachycardia,  exacerbation COPD  Order- CXR   Dx exacerbation COPD  Script sent refilling Symbicort  If you get worse this weekend, go to ER

## 2017-12-06 NOTE — Assessment & Plan Note (Signed)
**Note De-Identified Razon Obfuscation** She was tachypneic on arrival and hypoxic on 3 L.  Saturation improved on 4 L.  Husband follows her saturation and I gave guidelines for target range with suggestion they keep her up to 3-4 L as needed.  If she gets worse go to ER.  Refilled Symbicort and discussed use of medications.  Holding off on further antibiotic and steroid for now.

## 2017-12-09 ENCOUNTER — Encounter: Payer: Self-pay | Admitting: Physician Assistant

## 2017-12-09 ENCOUNTER — Ambulatory Visit (INDEPENDENT_AMBULATORY_CARE_PROVIDER_SITE_OTHER): Payer: Medicare Other | Admitting: Physician Assistant

## 2017-12-09 VITALS — BP 161/88 | HR 116 | Temp 97.0°F | Resp 18 | Ht 65.0 in | Wt 250.0 lb

## 2017-12-09 DIAGNOSIS — R000 Tachycardia, unspecified: Secondary | ICD-10-CM

## 2017-12-09 DIAGNOSIS — L400 Psoriasis vulgaris: Secondary | ICD-10-CM | POA: Diagnosis not present

## 2017-12-09 DIAGNOSIS — J449 Chronic obstructive pulmonary disease, unspecified: Secondary | ICD-10-CM | POA: Diagnosis not present

## 2017-12-09 DIAGNOSIS — R0601 Orthopnea: Secondary | ICD-10-CM

## 2017-12-09 DIAGNOSIS — T85511A Breakdown (mechanical) of esophageal anti-reflux device, initial encounter: Secondary | ICD-10-CM | POA: Diagnosis not present

## 2017-12-09 DIAGNOSIS — G4733 Obstructive sleep apnea (adult) (pediatric): Secondary | ICD-10-CM | POA: Diagnosis not present

## 2017-12-09 MED ORDER — LISINOPRIL-HYDROCHLOROTHIAZIDE 10-12.5 MG PO TABS: 1.0000 | ORAL_TABLET | Freq: Every day | ORAL | 3 refills | Status: DC

## 2017-12-09 NOTE — Patient Instructions (Signed)
**Note De-identified Schnick [facility]bfuscation** In a few days you may receive a survey in the mail or online from [employer] regarding your visit with us today. Please take a moment to fill this out. Your feedback is very important to our whole office. It can help us better understand your needs as well as improve your experience and satisfaction. Thank you for taking your time to complete it. We care about you.  Kellis Mcadam, PA-C  

## 2017-12-10 NOTE — Progress Notes (Signed)
**Note De-Identified Smarr Obfuscation** BP (!) 161/88   Pulse (!) 116   Temp (!) 97 F (36.1 C) (Oral)   Resp 18   Ht 5' 5" (1.651 m)   Wt 250 lb (113.4 kg)   SpO2 92%   BMI 41.60 kg/m    Subjective:    Patient ID: Hailey Hernandez, female    DOB: 07/01/1956, 62 y.o.   MRN: [id number]  HPI: Hailey Hernandez is a 62 y.o. female presenting on 12/09/2017 for Hypertension  She has been having more elevated blood pressure and consistent elevation in heart rate.  She has known COPD, and hypoventilation. She has elevated readings in her pulmonologist office and all weekend. Her pulse oximetry has been slightly decreased. She has not seen cardiology in about a year.  Past Medical History:  Diagnosis Date  . Allergy   . Anxiety disorder   . Arthritis    bil legs  . Asthma   . Cancer ([place])   . Chronic bronchitis   . COPD (chronic obstructive pulmonary disease) ([place])   . Depression   . Diverticulitis   . DJD (degenerative joint disease)    spine  . Fibromyalgia   . GERD (gastroesophageal reflux disease)   . Glaucoma   . History of thyroid cancer   . HTN (hypertension)    no longer on BP medication  . Hyperlipidemia   . Impaired glucose tolerance 05/08/2013  . OSA (obstructive sleep apnea)    CPAP  last sleep study 8-10 yr.ag0  . Polymyalgia ([place])   . Psoriasis   . Rhinitis   . Tobacco abuse    Relevant past medical, surgical, family and social history reviewed and updated as indicated. Interim medical history since our last visit reviewed. Allergies and medications reviewed and updated. DATA REVIEWED: CHART IN EPIC  Family History reviewed for pertinent findings.  Review of Systems  Constitutional: Positive for fatigue. Negative for diaphoresis and fever.  HENT: Negative.   Eyes: Negative.   Respiratory: Positive for cough, shortness of breath and wheezing. Negative for chest tightness.   Cardiovascular: Positive for palpitations.  Gastrointestinal: Negative.   Genitourinary: Negative.     Allergies as of  12/09/2017      Reactions   Doxycycline Shortness Of Breath, Other (See Comments)   Drug interaction with Soriatane   Tramadol Shortness Of Breath   Did not work   Milnacipran    REACTION: dizzy   Apremilast Rash   Rash, able to take this currently      Medication List        Accurate as of 12/09/17 11:59 PM. Always use your most recent med list.          albuterol 108 (90 Base) MCG/ACT inhaler Commonly known as:  PROVENTIL HFA;VENTOLIN HFA Inhale 2 puffs into the lungs every 6 (six) hours as needed for wheezing or shortness of breath.   albuterol (2.5 MG/3ML) 0.083% nebulizer solution Commonly known as:  PROVENTIL Take 3 mLs (2.5 mg total) by nebulization every 4 (four) hours as needed for wheezing or shortness of breath. DX: 496   aspirin EC 81 MG tablet Take 81 mg by mouth daily.   atorvastatin 80 MG tablet Commonly known as:  LIPITOR TAKE 1 TABLET EVERY DAY   budesonide-formoterol 160-4.5 MCG/ACT inhaler Commonly known as:  SYMBICORT Inhale 2 puffs then rinse mouth, twice daily   colchicine 0.6 MG tablet Take 1 tablet (0.6 mg total) by mouth daily.   Compressor/Nebulizer Misc Use up to 4 **Note De-Identified Stang Obfuscation** times daily when needed   fluticasone 50 MCG/ACT nasal spray Commonly known as:  FLONASE Place 2 sprays into both nostrils daily.   folic acid 1 MG tablet Commonly known as:  FOLVITE Take 1 mg by mouth daily.   furosemide 20 MG tablet Commonly known as:  LASIX Take 20 mg by mouth.   lisinopril-hydrochlorothiazide 10-12.5 MG tablet Commonly known as:  PRINZIDE,ZESTORETIC Take 1 tablet by mouth daily.   methotrexate 2.5 MG tablet Commonly known as:  RHEUMATREX 25 mg once a week. On wednesday Caution:Chemotherapy. Protect from light.   montelukast 10 MG tablet Commonly known as:  SINGULAIR TAKE 1 TABLET EVERY DAY   umeclidinium bromide 62.5 MCG/INH Aepb Commonly known as:  INCRUSE ELLIPTA Inhale 1 puff into the lungs daily.          Objective:    BP (!)  161/88   Pulse (!) 116   Temp (!) 97 F (36.1 C) (Oral)   Resp 18   Ht 5' 5" (1.651 m)   Wt 250 lb (113.4 kg)   SpO2 92%   BMI 41.60 kg/m   Allergies  Allergen Reactions  . Doxycycline Shortness Of Breath and Other (See Comments)    Drug interaction with Soriatane  . Tramadol Shortness Of Breath    Did not work  . Milnacipran     REACTION: dizzy  . Apremilast Rash    Rash, able to take this currently    Wt Readings from Last 3 Encounters:  12/09/17 250 lb (113.4 kg)  12/06/17 241 lb 1.6 oz (109.4 kg)  11/08/17 255 lb 6.4 oz (115.8 kg)    Physical Exam  Constitutional: She is oriented to person, place, and time. She appears well-developed and well-nourished.  HENT:  Head: Normocephalic and atraumatic.  Eyes: Conjunctivae and EOM are normal. Pupils are equal, round, and reactive to light.  Cardiovascular: Regular rhythm, normal heart sounds and intact distal pulses. Tachycardia present.  No murmur heard. Pulmonary/Chest: Effort normal and breath sounds normal.  Abdominal: Soft. Bowel sounds are normal.  Neurological: She is alert and oriented to person, place, and time. She has normal reflexes.  Skin: Skin is warm and dry. No rash noted.  Psychiatric: She has a normal mood and affect. Her behavior is normal. Judgment and thought content normal.    Results for orders placed or performed in visit on 11/08/17  CMP14+EGFR  Result Value Ref Range   Glucose 158 (H) 65 - 99 mg/dL   BUN 10 8 - 27 mg/dL   Creatinine, Ser 0.72 0.57 - 1.00 mg/dL   GFR calc non Af Amer 89 >59 mL/min/1.73   GFR calc Af Amer 103 >59 mL/min/1.73   BUN/Creatinine Ratio 14 12 - 28   Sodium 142 134 - 144 mmol/L   Potassium 4.4 3.5 - 5.2 mmol/L   Chloride 93 (L) 96 - 106 mmol/L   CO2 31 (H) 20 - 29 mmol/L   Calcium 9.2 8.7 - 10.3 mg/dL   Total Protein 7.1 6.0 - 8.5 g/dL   Albumin 3.8 3.6 - 4.8 g/dL   Globulin, Total 3.3 1.5 - 4.5 g/dL   Albumin/Globulin Ratio 1.2 1.2 - 2.2   Bilirubin Total 0.6  0.0 - 1.2 mg/dL   Alkaline Phosphatase 132 (H) 39 - 117 IU/L   AST 21 0 - 40 IU/L   ALT 17 0 - 32 IU/L  CBC with Differential/Platelet  Result Value Ref Range   WBC 7.7 3.4 - 10.8 x10E3/uL   RBC **Note De-identified Laconte Obfuscation** 5.41 (H) 3.77 - 5.28 x10E6/uL   Hemoglobin 17.7 (H) 11.1 - 15.9 g/dL   Hematocrit 54.8 (H) 34.0 - 46.6 %   MCV 101 (H) 79 - 97 fL   MCH 32.7 26.6 - 33.0 pg   MCHC 32.3 31.5 - 35.7 g/dL   RDW 16.7 (H) 12.3 - 15.4 %   Platelets 164 150 - 379 x10E3/uL   Neutrophils 74 Not Estab. %   Lymphs 18 Not Estab. %   Monocytes 6 Not Estab. %   Eos 2 Not Estab. %   Basos 0 Not Estab. %   Neutrophils Absolute 5.6 1.4 - 7.0 x10E3/uL   Lymphocytes Absolute 1.4 0.7 - 3.1 x10E3/uL   Monocytes Absolute 0.5 0.1 - 0.9 x10E3/uL   EOS (ABSOLUTE) 0.2 0.0 - 0.4 x10E3/uL   Basophils Absolute 0.0 0.0 - 0.2 x10E3/uL   Immature Granulocytes 0 Not Estab. %   Immature Grans (Abs) 0.0 0.0 - 0.1 x10E3/uL  LDL Cholesterol, Direct  Result Value Ref Range   LDL Direct 106 (H) 0 - 99 mg/dL  TSH  Result Value Ref Range   TSH 1.430 0.450 - 4.500 uIU/mL  Bayer DCA Hb A1c Waived  Result Value Ref Range   Bayer DCA Hb A1c Waived 5.7 <7.0 %  Brain natriuretic peptide  Result Value Ref Range   BNP 47.9 0.0 - 100.0 pg/mL      Assessment & Plan:   1. Orthopnea - Ambulatory referral to Cardiology  2. Tachycardia - Ambulatory referral to Cardiology   Continue all other maintenance medications as listed above.  Follow up plan: Return in about 10 days (around 12/19/2017) for appt with [facility].  Educational handout given for survey   S.  PA-C [facility] [address]  [place], [place] [address] [phone]   12/10/2017, 11:16 PM  

## 2017-12-12 ENCOUNTER — Other Ambulatory Visit: Payer: Self-pay

## 2017-12-12 DIAGNOSIS — J984 Other disorders of lung: Secondary | ICD-10-CM

## 2017-12-18 ENCOUNTER — Ambulatory Visit: Payer: Medicare Other | Admitting: *Deleted

## 2017-12-19 ENCOUNTER — Encounter: Payer: Self-pay | Admitting: Family Medicine

## 2017-12-19 ENCOUNTER — Ambulatory Visit (INDEPENDENT_AMBULATORY_CARE_PROVIDER_SITE_OTHER): Payer: Medicare Other | Admitting: Family Medicine

## 2017-12-19 VITALS — BP 131/68 | HR 110 | Temp 97.0°F | Ht 65.0 in | Wt 245.0 lb

## 2017-12-19 DIAGNOSIS — I10 Essential (primary) hypertension: Secondary | ICD-10-CM

## 2017-12-19 DIAGNOSIS — R000 Tachycardia, unspecified: Secondary | ICD-10-CM

## 2017-12-19 DIAGNOSIS — K219 Gastro-esophageal reflux disease without esophagitis: Secondary | ICD-10-CM | POA: Diagnosis not present

## 2017-12-19 MED ORDER — FAMOTIDINE 20 MG PO TABS: 20.0000 mg | ORAL_TABLET | Freq: Two times a day (BID) | ORAL | 1 refills | Status: DC

## 2017-12-19 NOTE — Patient Instructions (Signed)
**Note De-identified Howland Obfuscation** Great to see you!   

## 2017-12-19 NOTE — Progress Notes (Signed)
**Note De-identified Kelner Obfuscation**   **Note De-Identified Brickner Obfuscation** HPI  Patient presents today for follow-up hypertension.  Patient here today for follow-up hypertension. Patient states that she has stopped Lasix and started Prinzide.  She is tolerating this well and reports moderate diuresis.  She states that her blood pressure does seem to be much improved.  She has had some struggles with GERD lately, 3-4 times at a time is making an improvement, she was stopped on nephrology from taking Prilosec.   PMH: Smoking status noted ROS: Per HPI  Objective: BP 131/68   Pulse (!) 110   Temp (!) 97 F (36.1 C) (Oral)   Ht 5\' 5"  (1.651 m)   Wt 245 lb (111.1 kg)   SpO2 92%   BMI 40.77 kg/m  Gen: NAD, alert, cooperative with exam HEENT: NCAT, EOMI, PERRL CV: Regular, tachycardic, no murmur appreciated Resp: CTABL, no wheezes, non-labored Ext: No edema, warm Neuro: Alert and oriented, No gross deficits  Assessment and plan:  #Hypertension Much improved on Prinzide, continue Previously on Lasix, this has been discontinued.  Will monitor for need with grade 1 diastolic dysfunction. Consider beta-blocker with persistent tachycardia, has cardiology appointment coming up BMP  #Tachycardia Sinus tachycardia on last EKG done about 2 weeks ago. Consider beta-blocker-bisoprolol with such severe COPD  #GERD Patient using Tums frequently, will try H2 blocker PPI was discontinued by nephrology per her report   Meds ordered this encounter  Medications  . famotidine (PEPCID) 20 MG tablet    Sig: Take 1 tablet (20 mg total) by mouth 2 (two) times daily.    Dispense:  60 tablet    Refill:  [place], MD [facility] 12/19/2017, 3:23 PM

## 2017-12-20 ENCOUNTER — Ambulatory Visit (INDEPENDENT_AMBULATORY_CARE_PROVIDER_SITE_OTHER)
Admission: RE | Admit: 2017-12-20 | Discharge: 2017-12-20 | Disposition: A | Discharge status: Home / Self Care | Payer: Medicare Other | Source: Ambulatory Visit | Attending: Internal Medicine | Admitting: Internal Medicine

## 2017-12-20 DIAGNOSIS — J984 Other disorders of lung: Secondary | ICD-10-CM | POA: Diagnosis not present

## 2017-12-20 DIAGNOSIS — R918 Other nonspecific abnormal finding of lung field: Secondary | ICD-10-CM | POA: Diagnosis not present

## 2017-12-20 LAB — BMP8+EGFR
BUN/Creatinine Ratio: 20 (ref 12–28)
BUN: 22 mg/dL (ref 8–27)
CO2: 32 mmol/L — AB (ref 20–29)
CREATININE: 1.09 mg/dL — AB (ref 0.57–1.00)
Calcium: 10.4 mg/dL — ABNORMAL HIGH (ref 8.7–10.3)
Chloride: 91 mmol/L — ABNORMAL LOW (ref 96–106)
GFR calc Af Amer: 62 mL/min/{1.73_m2} (ref >59)
GFR, EST NON AFRICAN AMERICAN: 54 mL/min/{1.73_m2} — AB (ref >59)
Glucose: 117 mg/dL — ABNORMAL HIGH (ref 65–99)
Potassium: 4.5 mmol/L (ref 3.5–5.2)
SODIUM: 141 mmol/L (ref 134–144)

## 2017-12-24 ENCOUNTER — Telehealth: Payer: Self-pay | Admitting: Internal Medicine

## 2017-12-24 NOTE — Telephone Encounter (Signed)
**Note De-Identified Youngers Obfuscation** Notes recorded by Deneise Lever, MD on 12/23/2017 at 9:54 AM EDT CT chest- There are some small nodules in the lung that are probably benign, but should be looked at with another CT scan in a year. We can talk about this at next visit. There is atherosclerosis in the arteries, increased pressure in the lung arteries, and some enlargement of the liver. We will review all of this at next visit. ------------------------------------------ Spoke with pt. She is aware of results. Nothing further was needed.

## 2017-12-26 ENCOUNTER — Other Ambulatory Visit: Payer: Medicare Other

## 2017-12-26 DIAGNOSIS — N289 Disorder of kidney and ureter, unspecified: Secondary | ICD-10-CM | POA: Diagnosis not present

## 2017-12-27 LAB — BMP8+EGFR
BUN/Creatinine Ratio: 17 (ref 12–28)
BUN: 17 mg/dL (ref 8–27)
CO2: 28 mmol/L (ref 20–29)
CREATININE: 1 mg/dL (ref 0.57–1.00)
Calcium: 9.3 mg/dL (ref 8.7–10.3)
Chloride: 96 mmol/L (ref 96–106)
GFR, EST AFRICAN AMERICAN: 69 mL/min/{1.73_m2} (ref >59)
GFR, EST NON AFRICAN AMERICAN: 60 mL/min/{1.73_m2} (ref >59)
Glucose: 126 mg/dL — ABNORMAL HIGH (ref 65–99)
POTASSIUM: 4.8 mmol/L (ref 3.5–5.2)
SODIUM: 143 mmol/L (ref 134–144)

## 2018-01-06 ENCOUNTER — Other Ambulatory Visit: Payer: Self-pay | Admitting: Family Medicine

## 2018-01-09 DIAGNOSIS — L400 Psoriasis vulgaris: Secondary | ICD-10-CM | POA: Diagnosis not present

## 2018-01-09 DIAGNOSIS — G4733 Obstructive sleep apnea (adult) (pediatric): Secondary | ICD-10-CM | POA: Diagnosis not present

## 2018-01-09 DIAGNOSIS — T85511A Breakdown (mechanical) of esophageal anti-reflux device, initial encounter: Secondary | ICD-10-CM | POA: Diagnosis not present

## 2018-01-09 DIAGNOSIS — J449 Chronic obstructive pulmonary disease, unspecified: Secondary | ICD-10-CM | POA: Diagnosis not present

## 2018-01-30 ENCOUNTER — Encounter: Payer: Self-pay | Admitting: Family Medicine

## 2018-01-30 ENCOUNTER — Ambulatory Visit (INDEPENDENT_AMBULATORY_CARE_PROVIDER_SITE_OTHER): Payer: Medicare Other | Admitting: Family Medicine

## 2018-01-30 VITALS — BP 138/86 | HR 108 | Temp 98.5°F | Ht 65.0 in | Wt 246.2 lb

## 2018-01-30 DIAGNOSIS — J449 Chronic obstructive pulmonary disease, unspecified: Secondary | ICD-10-CM | POA: Diagnosis not present

## 2018-01-30 DIAGNOSIS — I10 Essential (primary) hypertension: Secondary | ICD-10-CM

## 2018-01-30 DIAGNOSIS — R0789 Other chest pain: Secondary | ICD-10-CM | POA: Diagnosis not present

## 2018-01-30 DIAGNOSIS — E119 Type 2 diabetes mellitus without complications: Secondary | ICD-10-CM

## 2018-01-30 MED ORDER — CEFDINIR 300 MG PO CAPS: 300.0000 mg | ORAL_CAPSULE | Freq: Two times a day (BID) | ORAL | 0 refills | Status: DC

## 2018-01-30 MED ORDER — BISOPROLOL FUMARATE 5 MG PO TABS: 5.0000 mg | ORAL_TABLET | Freq: Every day | ORAL | 3 refills | Status: DC

## 2018-01-30 MED ORDER — PREDNISONE 20 MG PO TABS: 20.0000 mg | ORAL_TABLET | Freq: Every day | ORAL | 0 refills | Status: DC

## 2018-01-30 NOTE — Progress Notes (Signed)
**Note De-identified Toner Obfuscation**   **Note De-Identified Hemmerich Obfuscation** HPI  Patient presents today for follow-up chronic medical conditions.  Hypertension Doing well with Prinzide, does not seem to have any more leg swelling than previous.  Cough Worse over the last 2 to 3 weeks, patient states that after her last round of prednisone plus antibiotics she did improve but did not completely resolve, that was 4 to 6 weeks ago.  Type 2 diabetes Watching diet, not checking blood sugars regularly as she does not have a meter.  PMH: Smoking status noted ROS: Per HPI  Objective: BP 138/86   Pulse (!) 108   Temp 98.5 F (36.9 C) (Oral)   Ht _0  (1.651 m)   Wt 246 lb 3.2 oz (111.7 kg)   SpO2 93%   BMI 40.97 kg/m  Gen: NAD, alert, cooperative with exam HEENT: NCAT CV: RRR, good S1/S2, no murmur Resp: CTABL, no wheezes, non-labored, oxygen Hurley nasal cannula Ext: No edema, warm Neuro: Alert and oriented, No gross deficits  Assessment and plan:  #COPD COPD exacerbation, treated with Omnicef plus prednisone Continue to chronic oxygen  #Type 2 diabetes Expect good control, however steroids have been something that could attribute to less glycemic control Continue without daily checks No medications  #Hypertension Well-controlled, no changes Labs in 2 weeks with a1c   On coming back into the room the patient was holding her L chest with the levine sign. She describes L chest pain at rest and sometime worse with exercise like walking up the stairs last night. Patient has already been referred to cardiology, she has had 2 voicemails left according to our chart but states she has not had a call, EKG is normal today except for tachycardia, given her the number of cardiologist to call. Start bisoprolol 5 mg. ( could use bisoprolol 5/HCTZ 6.25 if needed fro 4$ list med)  Orders Placed This Encounter  Procedures  . CMP14+EGFR    Standing Status:   Future    Standing Expiration Date:   01/31/2019  . Bayer DCA Hb A1c Waived    Standing Status:    Future    Standing Expiration Date:   01/31/2019  . EKG 12-Lead    Meds ordered this encounter  Medications  . cefdinir (OMNICEF) 300 MG capsule    Sig: Take 1 capsule (300 mg total) by mouth 2 (two) times daily. 1 po BID    Dispense:  20 capsule    Refill:  0  . predniSONE (DELTASONE) 20 MG tablet    Sig: Take 1 tablet (20 mg total) by mouth daily with breakfast.    Dispense:  10 tablet    Refill:  0  . bisoprolol (ZEBETA) 5 MG tablet    Sig: Take 1 tablet (5 mg total) by mouth daily.    Dispense:  30 tablet    Refill:  Terrell Hills, MD [facility] 01/30/2018, 2:38 PM

## 2018-01-30 NOTE — Patient Instructions (Signed)
**Note De-Identified Prats Obfuscation** Great to see you!  Start omnicef and prednisone for your cough  Come back in about 2 weeks for a lab only visit for your A1C  Come back in 4 months to see Dr. [name]

## 2018-02-05 DIAGNOSIS — I129 Hypertensive chronic kidney disease with stage 1 through stage 4 chronic kidney disease, or unspecified chronic kidney disease: Secondary | ICD-10-CM | POA: Diagnosis not present

## 2018-02-05 DIAGNOSIS — J449 Chronic obstructive pulmonary disease, unspecified: Secondary | ICD-10-CM | POA: Diagnosis not present

## 2018-02-05 DIAGNOSIS — R809 Proteinuria, unspecified: Secondary | ICD-10-CM | POA: Diagnosis not present

## 2018-02-05 DIAGNOSIS — N181 Chronic kidney disease, stage 1: Secondary | ICD-10-CM | POA: Diagnosis not present

## 2018-02-08 DIAGNOSIS — L400 Psoriasis vulgaris: Secondary | ICD-10-CM | POA: Diagnosis not present

## 2018-02-08 DIAGNOSIS — J449 Chronic obstructive pulmonary disease, unspecified: Secondary | ICD-10-CM | POA: Diagnosis not present

## 2018-02-08 DIAGNOSIS — G4733 Obstructive sleep apnea (adult) (pediatric): Secondary | ICD-10-CM | POA: Diagnosis not present

## 2018-02-08 DIAGNOSIS — T85511A Breakdown (mechanical) of esophageal anti-reflux device, initial encounter: Secondary | ICD-10-CM | POA: Diagnosis not present

## 2018-02-10 DIAGNOSIS — Z5181 Encounter for therapeutic drug level monitoring: Secondary | ICD-10-CM | POA: Diagnosis not present

## 2018-02-10 DIAGNOSIS — Z79899 Other long term (current) drug therapy: Secondary | ICD-10-CM | POA: Diagnosis not present

## 2018-02-10 DIAGNOSIS — L409 Psoriasis, unspecified: Secondary | ICD-10-CM | POA: Diagnosis not present

## 2018-02-17 ENCOUNTER — Other Ambulatory Visit: Payer: Medicare Other

## 2018-02-17 DIAGNOSIS — E119 Type 2 diabetes mellitus without complications: Secondary | ICD-10-CM | POA: Diagnosis not present

## 2018-02-17 DIAGNOSIS — I10 Essential (primary) hypertension: Secondary | ICD-10-CM

## 2018-02-17 LAB — BAYER DCA HB A1C WAIVED: HB A1C (BAYER DCA - WAIVED): 5.3 % (ref <7.0)

## 2018-02-18 ENCOUNTER — Telehealth: Payer: Self-pay | Admitting: Family Medicine

## 2018-02-18 LAB — CMP14+EGFR
ALK PHOS: 126 IU/L — AB (ref 39–117)
ALT: 25 IU/L (ref 0–32)
AST: 19 IU/L (ref 0–40)
Albumin/Globulin Ratio: 1.5 (ref 1.2–2.2)
Albumin: 4.1 g/dL (ref 3.6–4.8)
BILIRUBIN TOTAL: 0.7 mg/dL (ref 0.0–1.2)
BUN/Creatinine Ratio: 16 (ref 12–28)
BUN: 18 mg/dL (ref 8–27)
CHLORIDE: 95 mmol/L — AB (ref 96–106)
CO2: 32 mmol/L — AB (ref 20–29)
CREATININE: 1.16 mg/dL — AB (ref 0.57–1.00)
Calcium: 9.4 mg/dL (ref 8.7–10.3)
GFR calc Af Amer: 57 mL/min/{1.73_m2} — ABNORMAL LOW (ref >59)
GFR calc non Af Amer: 50 mL/min/{1.73_m2} — ABNORMAL LOW (ref >59)
GLUCOSE: 144 mg/dL — AB (ref 65–99)
Globulin, Total: 2.8 g/dL (ref 1.5–4.5)
Potassium: 4.4 mmol/L (ref 3.5–5.2)
Sodium: 144 mmol/L (ref 134–144)
Total Protein: 6.9 g/dL (ref 6.0–8.5)

## 2018-02-18 NOTE — Telephone Encounter (Signed)
**Note De-identified Campanile Obfuscation** lmtcb

## 2018-02-19 ENCOUNTER — Ambulatory Visit
Admission: RE | Admit: 2018-02-19 | Discharge: 2018-02-19 | Disposition: A | Discharge status: Home / Self Care | Payer: Medicare Other | Source: Ambulatory Visit | Attending: Family Medicine | Admitting: Family Medicine

## 2018-02-19 DIAGNOSIS — Z1231 Encounter for screening mammogram for malignant neoplasm of breast: Secondary | ICD-10-CM

## 2018-02-20 ENCOUNTER — Other Ambulatory Visit: Payer: Self-pay | Admitting: Family Medicine

## 2018-02-20 DIAGNOSIS — R928 Other abnormal and inconclusive findings on diagnostic imaging of breast: Secondary | ICD-10-CM

## 2018-02-25 ENCOUNTER — Other Ambulatory Visit: Payer: Medicare Other

## 2018-02-26 ENCOUNTER — Ambulatory Visit (INDEPENDENT_AMBULATORY_CARE_PROVIDER_SITE_OTHER): Payer: Medicare Other | Admitting: Family Medicine

## 2018-02-26 ENCOUNTER — Ambulatory Visit (INDEPENDENT_AMBULATORY_CARE_PROVIDER_SITE_OTHER): Payer: Medicare Other

## 2018-02-26 ENCOUNTER — Encounter: Payer: Self-pay | Admitting: Family Medicine

## 2018-02-26 VITALS — BP 135/77 | HR 102 | Temp 97.7°F | Ht 65.0 in | Wt 245.0 lb

## 2018-02-26 DIAGNOSIS — S93491A Sprain of other ligament of right ankle, initial encounter: Secondary | ICD-10-CM | POA: Diagnosis not present

## 2018-02-26 DIAGNOSIS — M79671 Pain in right foot: Secondary | ICD-10-CM

## 2018-02-26 DIAGNOSIS — M7989 Other specified soft tissue disorders: Secondary | ICD-10-CM | POA: Diagnosis not present

## 2018-02-26 NOTE — Progress Notes (Signed)
**Note De-Identified Caamano Obfuscation** BP 135/77   Pulse (!) 102   Temp 97.7 F (36.5 C) (Oral)   Ht 5\' 5"  (1.651 m)   Wt 245 lb (111.1 kg)   BMI 40.77 kg/m    Subjective:    Patient ID: Hailey Hernandez, female    DOB: 28-Mar-1956, 62 y.o.   MRN: [id number]  HPI: Patient is a 62yr old female who presents to the clinic today with 5 day history of worsening right ankle and foot pain. Patient reports that her daughter was helping her up out of the bed over the weekend and her right leg "went out from under her." She feels as though she was "still asleep" when it occurred. She was initially able to "hobble around" but then required the support of a cane on Saturday and Sunday. She reports that ibuprofen and colchicine have helped alleviate some of the pain. She also states the pain is alleviated with rest. Of note, patient has a history of broken right ankle which was surgically repaired with a plate and screws approximately 30 years ago.    Relevant past medical, surgical, family and social history reviewed and updated as indicated. Interim medical history since our last visit reviewed. Allergies and medications reviewed and updated.  Review of Systems  Constitutional: Negative.   HENT: Negative.   Eyes: Negative.   Respiratory: Positive for shortness of breath and wheezing.        Her respiratory symptoms are chronic, this is not a new presentation for this patient.  Cardiovascular: Negative.   Gastrointestinal: Negative.   Endocrine: Negative.   Genitourinary: Negative.   Musculoskeletal: Positive for arthralgias, gait problem and joint swelling.       Significant pain and tenderness reported in the right ankle, lateral aspect of right foot, dorsal aspect of foot and "down at the toes."  Allergic/Immunologic: Negative.   Hematological: Negative.   Psychiatric/Behavioral: Negative.       Per HPI unless specifically indicated above   Allergies as of 02/26/2018      Reactions   Doxycycline Shortness Of Breath, Other  (See Comments)   Drug interaction with Soriatane   Tramadol Shortness Of Breath   Did not work   Milnacipran    REACTION: dizzy   Apremilast Rash   Rash, able to take this currently      Medication List        Accurate as of 02/26/18  4:09 PM. Always use your most recent med list.          albuterol 108 (90 Base) MCG/ACT inhaler Commonly known as:  PROVENTIL HFA;VENTOLIN HFA Inhale 2 puffs into the lungs every 6 (six) hours as needed for wheezing or shortness of breath.   albuterol (2.5 MG/3ML) 0.083% nebulizer solution Commonly known as:  PROVENTIL Take 3 mLs (2.5 mg total) by nebulization every 4 (four) hours as needed for wheezing or shortness of breath. DX: 496   aspirin EC 81 MG tablet Take 81 mg by mouth daily.   atorvastatin 80 MG tablet Commonly known as:  LIPITOR TAKE 1 TABLET EVERY DAY   bisoprolol 5 MG tablet Commonly known as:  ZEBETA Take 1 tablet (5 mg total) by mouth daily.   budesonide-formoterol 160-4.5 MCG/ACT inhaler Commonly known as:  SYMBICORT Inhale 2 puffs then rinse mouth, twice daily   colchicine 0.6 MG tablet Take 1 tablet (0.6 mg total) by mouth daily.   Compressor/Nebulizer Misc Use up to 4 times daily when needed   famotidine 20 MG **Note De-Identified Schonberger Obfuscation** tablet Commonly known as:  PEPCID Take 1 tablet (20 mg total) by mouth 2 (two) times daily.   fluticasone 50 MCG/ACT nasal spray Commonly known as:  FLONASE Place 2 sprays into both nostrils daily.   folic acid 1 MG tablet Commonly known as:  FOLVITE Take 1 mg by mouth daily.   lisinopril-hydrochlorothiazide 10-12.5 MG tablet Commonly known as:  PRINZIDE,ZESTORETIC Take 1 tablet by mouth daily.   methotrexate 2.5 MG tablet Commonly known as:  RHEUMATREX 25 mg once a week. On wednesday Caution:Chemotherapy. Protect from light.   montelukast 10 MG tablet Commonly known as:  SINGULAIR TAKE 1 TABLET EVERY DAY   predniSONE 20 MG tablet Commonly known as:  DELTASONE Take 1 tablet (20 mg total)  by mouth daily with breakfast.   umeclidinium bromide 62.5 MCG/INH Aepb Commonly known as:  INCRUSE ELLIPTA Inhale 1 puff into the lungs daily.          Objective:    BP 135/77   Pulse (!) 102   Temp 97.7 F (36.5 C) (Oral)   Ht 5\' 5"  (1.651 m)   Wt 245 lb (111.1 kg)   BMI 40.77 kg/m   Wt Readings from Last 3 Encounters:  02/26/18 245 lb (111.1 kg)  01/30/18 246 lb 3.2 oz (111.7 kg)  12/19/17 245 lb (111.1 kg)    Physical Exam  Constitutional: She appears well-developed and well-nourished. No distress.  HENT:  Head: Normocephalic.  Cardiovascular: Normal rate and regular rhythm.  Musculoskeletal: She exhibits edema and tenderness.  Right ankle and dorsal aspect of right forefoot edematous. Significant tenderness noted in right ankle, lateral aspect of the foot, dorsal aspect of the foot especially along the MTP joints. Decreased ROM. Tenderness with assisted movement on exam. Pedal pulses present.      Assessment & Plan:   Patient presents to the clinic with 5 day history of worsening right ankle and foot pain due to falling while trying to get out of bed. Given that a foot XR has ruled out an acute fracture/break or bony abnormality, her history and presentation are consistent with an anterior talofibular sprain of the right ankle.  I have advised the patient to rest and elevate her ankle/foot as much as possible and apply ice to help with inflammation. She has been encouraged to stretch her ankle gently as tolerated. I encouraged her to continue using her ibuprofen as needed for pain relief.   Problem List Items Addressed This Visit    None    Visit Diagnoses    Sprain of anterior talofibular ligament of right ankle, initial encounter    -  Primary   Relevant Orders   DG Foot Complete Right (Completed)   DG Ankle Complete Right (Completed)       Follow up plan: I have advised the patient to contact our office should her symptoms worsen or not improve.   Darla Lesches, PA-S  Counseling provided for all of the vaccine components Orders Placed This Encounter  Procedures  . DG Foot Complete Right  . DG Ankle Complete Right   Patient seen and examined with Vita Barley, PA student, agree with assessment and plan above, x-ray of the ankle showed no signs of fracture and was already read by radiology.  Continue conservative management and gradual increases in exercise. Caryl Pina, MD [facility] 02/26/2018, 4:09 PM

## 2018-02-28 DIAGNOSIS — I129 Hypertensive chronic kidney disease with stage 1 through stage 4 chronic kidney disease, or unspecified chronic kidney disease: Secondary | ICD-10-CM | POA: Diagnosis not present

## 2018-03-04 ENCOUNTER — Ambulatory Visit
Admission: RE | Admit: 2018-03-04 | Discharge: 2018-03-04 | Disposition: A | Discharge status: Home / Self Care | Payer: Medicare Other | Source: Ambulatory Visit | Attending: Family Medicine | Admitting: Family Medicine

## 2018-03-04 ENCOUNTER — Ambulatory Visit: Payer: Medicare Other

## 2018-03-04 DIAGNOSIS — R928 Other abnormal and inconclusive findings on diagnostic imaging of breast: Secondary | ICD-10-CM

## 2018-03-05 ENCOUNTER — Ambulatory Visit: Payer: Medicare Other | Admitting: Physician Assistant

## 2018-03-11 DIAGNOSIS — G4733 Obstructive sleep apnea (adult) (pediatric): Secondary | ICD-10-CM | POA: Diagnosis not present

## 2018-03-11 DIAGNOSIS — L400 Psoriasis vulgaris: Secondary | ICD-10-CM | POA: Diagnosis not present

## 2018-03-11 DIAGNOSIS — J449 Chronic obstructive pulmonary disease, unspecified: Secondary | ICD-10-CM | POA: Diagnosis not present

## 2018-03-11 DIAGNOSIS — T85511A Breakdown (mechanical) of esophageal anti-reflux device, initial encounter: Secondary | ICD-10-CM | POA: Diagnosis not present

## 2018-03-27 ENCOUNTER — Other Ambulatory Visit: Payer: Self-pay | Admitting: Family Medicine

## 2018-04-01 ENCOUNTER — Telehealth: Payer: Self-pay | Admitting: Internal Medicine

## 2018-04-01 DIAGNOSIS — G4733 Obstructive sleep apnea (adult) (pediatric): Secondary | ICD-10-CM

## 2018-04-01 NOTE — Telephone Encounter (Signed)
**Note De-Identified Martell Obfuscation** Called and spoke with patient regarding order for new cpap machine as current machine is broken. Advised pt of CY recommendations below. Placed DME referral today for urgent request for new CPAP machine and supplies  Left VM with Maudie Mercury with [employer] at [phone] to return call. Letting her know in VM making her aware of situation above Pt is unable to use current cpap machine as it is broken, in need of new machine and supplies urgent request.

## 2018-04-01 NOTE — Telephone Encounter (Signed)
**Note De-Identified Smaltz Obfuscation** Order DME- please replace broken CPAP machine auto 10-15, mask of chice, humidifier, supplies, AirView   Compliant with no break in therapy

## 2018-04-01 NOTE — Telephone Encounter (Signed)
**Note De-Identified Hailey Hernandez** Called and spoke to pt. Pt is requesting order for cpap machine, as her current machine has broken. Per our records cpap settings are 10-15cm.  CY please advise if okay to order cpap with current settings. Thanks.  Current Outpatient Medications on File Prior to Visit  Medication Sig Dispense Refill  . albuterol (PROVENTIL HFA;VENTOLIN HFA) 108 (90 Base) MCG/ACT inhaler Inhale 2 puffs into the lungs every 6 (six) hours as needed for wheezing or shortness of breath. 1 Inhaler 12  . albuterol (PROVENTIL) (2.5 MG/3ML) 0.083% nebulizer solution Take 3 mLs (2.5 mg total) by nebulization every 4 (four) hours as needed for wheezing or shortness of breath. DX: 496 180 vial 11  . aspirin EC 81 MG tablet Take 81 mg by mouth daily.    Marland Kitchen atorvastatin (LIPITOR) 80 MG tablet TAKE 1 TABLET EVERY DAY 90 tablet 0  . bisoprolol (ZEBETA) 5 MG tablet Take 1 tablet (5 mg total) by mouth daily. 30 tablet 3  . budesonide-formoterol (SYMBICORT) 160-4.5 MCG/ACT inhaler Inhale 2 puffs then rinse mouth, twice daily 1 Inhaler 12  . colchicine 0.6 MG tablet Take 1 tablet (0.6 mg total) by mouth daily. 30 tablet 3  . famotidine (PEPCID) 20 MG tablet Take 1 tablet (20 mg total) by mouth 2 (two) times daily. 60 tablet 1  . fluticasone (FLONASE) 50 MCG/ACT nasal spray Place 2 sprays into both nostrils daily. 16 g 6  . folic acid (FOLVITE) 1 MG tablet Take 1 mg by mouth daily.     Marland Kitchen lisinopril-hydrochlorothiazide (PRINZIDE,ZESTORETIC) 10-12.5 MG tablet Take 1 tablet by mouth daily. 90 tablet 3  . methotrexate (RHEUMATREX) 2.5 MG tablet 25 mg once a week. On wednesday Caution:Chemotherapy. Protect from light.    . montelukast (SINGULAIR) 10 MG tablet TAKE 1 TABLET EVERY DAY 90 tablet 0  . Nebulizers (COMPRESSOR/NEBULIZER) MISC Use up to 4 times daily when needed 1 each 0  . predniSONE (DELTASONE) 20 MG tablet Take 1 tablet (20 mg total) by mouth daily with breakfast. 10 tablet 0  . umeclidinium bromide (INCRUSE ELLIPTA) 62.5  MCG/INH AEPB Inhale 1 puff into the lungs daily. 30 each 11  . [DISCONTINUED] doxepin (SINEQUAN) 10 MG capsule 1-2 at bedtime     . [DISCONTINUED] fexofenadine (ALLEGRA) 180 MG tablet Take 180 mg by mouth daily.       No current facility-administered medications on file prior to visit.     Allergies  Allergen Reactions  . Doxycycline Shortness Of Breath and Other (See Comments)    Drug interaction with Soriatane  . Tramadol Shortness Of Breath    Did not work  . Milnacipran     REACTION: dizzy  . Apremilast Rash    Rash, able to take this currently

## 2018-04-02 ENCOUNTER — Encounter: Payer: Self-pay | Admitting: Internal Medicine

## 2018-04-02 DIAGNOSIS — G4733 Obstructive sleep apnea (adult) (pediatric): Secondary | ICD-10-CM | POA: Diagnosis not present

## 2018-04-02 NOTE — Telephone Encounter (Signed)
**Note De-Identified Renier Obfuscation** rec'd email from pt by mychart that new cpap machine will not come until another 7 business days. Placed a call with a supervisor Kim with [name] this morning.  The new machines come from the corporate office, that is why it takes longer, but we will try to get you one sooner. Maudie Mercury will be calling me back later this afternoon regarding this matter.

## 2018-04-02 NOTE — Telephone Encounter (Signed)
**Note De-Identified Kress Obfuscation** Kim with [employer] [phone]) returned phone call

## 2018-04-02 NOTE — Telephone Encounter (Signed)
**Note De-Identified Josten Obfuscation** lmtcb x2 for Hailey Hernandez with [facility].

## 2018-04-02 NOTE — Telephone Encounter (Signed)
**Note De-Identified Teehan Obfuscation** Called and spoke with Magda Paganini with [name] at phone [phone] Per Federico Flake states Kenney Houseman spoke with daughter of pt this morning Pt was a balance that is due immediately with [employer], once paid, cpap machine can be delivered.  Called and spoke with pt  She paid the balance owed to [place] Pt advised she spoke with [place] with [employer], they have new cpap machine ready for p/u Pt is picking up new machine, supplies and mask today. Nothing further needed.

## 2018-04-07 ENCOUNTER — Encounter: Payer: Self-pay | Admitting: Internal Medicine

## 2018-04-07 ENCOUNTER — Ambulatory Visit: Payer: Medicare Other | Admitting: Internal Medicine

## 2018-04-07 ENCOUNTER — Other Ambulatory Visit (INDEPENDENT_AMBULATORY_CARE_PROVIDER_SITE_OTHER): Payer: Medicare Other

## 2018-04-07 ENCOUNTER — Telehealth: Payer: Self-pay

## 2018-04-07 ENCOUNTER — Ambulatory Visit (INDEPENDENT_AMBULATORY_CARE_PROVIDER_SITE_OTHER)
Admission: RE | Admit: 2018-04-07 | Discharge: 2018-04-07 | Disposition: A | Discharge status: Home / Self Care | Payer: Medicare Other | Source: Ambulatory Visit | Attending: Internal Medicine | Admitting: Internal Medicine

## 2018-04-07 VITALS — BP 152/100 | HR 117 | Ht 65.0 in | Wt 250.0 lb

## 2018-04-07 DIAGNOSIS — J441 Chronic obstructive pulmonary disease with (acute) exacerbation: Secondary | ICD-10-CM | POA: Diagnosis not present

## 2018-04-07 DIAGNOSIS — J9611 Chronic respiratory failure with hypoxia: Secondary | ICD-10-CM | POA: Diagnosis not present

## 2018-04-07 DIAGNOSIS — R05 Cough: Secondary | ICD-10-CM | POA: Diagnosis not present

## 2018-04-07 DIAGNOSIS — J449 Chronic obstructive pulmonary disease, unspecified: Secondary | ICD-10-CM | POA: Diagnosis not present

## 2018-04-07 LAB — CBC WITH DIFFERENTIAL/PLATELET
Basophils Absolute: 0 10*3/uL (ref 0.0–0.1)
Basophils Relative: 0.5 % (ref 0.0–3.0)
EOS PCT: 1.5 % (ref 0.0–5.0)
Eosinophils Absolute: 0.1 10*3/uL (ref 0.0–0.7)
HCT: 45.7 % (ref 36.0–46.0)
Hemoglobin: 15.2 g/dL — ABNORMAL HIGH (ref 12.0–15.0)
LYMPHS ABS: 1.1 10*3/uL (ref 0.7–4.0)
Lymphocytes Relative: 12.3 % (ref 12.0–46.0)
MCHC: 33.2 g/dL (ref 30.0–36.0)
MCV: 103.8 fl — ABNORMAL HIGH (ref 78.0–100.0)
MONO ABS: 0.7 10*3/uL (ref 0.1–1.0)
Monocytes Relative: 8.3 % (ref 3.0–12.0)
Neutro Abs: 6.8 10*3/uL (ref 1.4–7.7)
Neutrophils Relative %: 77.4 % — ABNORMAL HIGH (ref 43.0–77.0)
Platelets: 135 10*3/uL — ABNORMAL LOW (ref 150.0–400.0)
RBC: 4.41 Mil/uL (ref 3.87–5.11)
RDW: 18.4 % — ABNORMAL HIGH (ref 11.5–15.5)
WBC: 8.8 10*3/uL (ref 4.0–10.5)

## 2018-04-07 LAB — BASIC METABOLIC PANEL
BUN: 12 mg/dL (ref 6–23)
CALCIUM: 9.2 mg/dL (ref 8.4–10.5)
CO2: 47 meq/L — AB (ref 19–32)
Chloride: 90 mEq/L — ABNORMAL LOW (ref 96–112)
Creatinine, Ser: 0.83 mg/dL (ref 0.40–1.20)
GFR: 73.52 mL/min (ref >60.00)
Glucose, Bld: 106 mg/dL — ABNORMAL HIGH (ref 70–99)
Potassium: 4.2 mEq/L (ref 3.5–5.1)
SODIUM: 140 meq/L (ref 135–145)

## 2018-04-07 MED ORDER — BUDESONIDE-FORMOTEROL FUMARATE 160-4.5 MCG/ACT IN AERO: INHALATION_SPRAY | RESPIRATORY_TRACT | 12 refills | Status: DC

## 2018-04-07 MED ORDER — AMOXICILLIN-POT CLAVULANATE 875-125 MG PO TABS: 1.0000 | ORAL_TABLET | Freq: Two times a day (BID) | ORAL | 0 refills | Status: DC

## 2018-04-07 MED ORDER — LEVALBUTEROL HCL 0.63 MG/3ML IN NEBU
0.6300 mg | INHALATION_SOLUTION | Freq: Once | RESPIRATORY_TRACT | Status: AC
  Administered 2018-04-07: 0.63 mg via RESPIRATORY_TRACT

## 2018-04-07 MED ORDER — METHYLPREDNISOLONE ACETATE 80 MG/ML IJ SUSP
80.0000 mg | Freq: Once | INTRAMUSCULAR | Status: AC
  Administered 2018-04-07: 80 mg via INTRAMUSCULAR

## 2018-04-07 NOTE — Progress Notes (Signed)
**Note De-Identified [place] Obfuscation** Patient ID: Hailey Hernandez, female    DOB: 10-29-1955, 62 y.o.   MRN: [id number]  HPI female smoker followed for chronic bronchitis/COPD, chronic hypoxic respiratory failure, tobacco use (1 PPD/45 pack years), OSA, obesity hypoventilation, complicated by psoriasis O2 2-3 L sleep/exertion, CPAP 13/[facility] Spirometry-06/07/17-severe obstructive airways disease, severe restriction of exhaled volume. FVC 1.18/37%, FEV1 0.90/36%, ratio 0.76, FEF 25-75% 0.73/33% ----------------------------------------------------------------------------------------- 12/06/17- 62 year old female smoker  followed for chronic bronchitis/COPD, chronic hypoxic respiratory failure, tobacco use (1 PPD/45 pack years), OSA, obesity hypoventilation, complicated by psoriasis/ MTX ----Been sick for about a month productive cough yellow in color. No fever noted.  Continues to smoke against advice despite a great deal of counseling and effort. O2 2-3 L sleep/exertion, CPAP 13/[facility]  Arrival sat 80% on 3L, 93% on 4L. BP 192/112, HR 123. EKG done sitting in chair for rhythm- SinusTach. NSSTWC , partial AVB Took antibiotic and 1.round of prednisone.  Quit halfway through a second prednisone taper because it made her nervous and raised her heart rate.  Denies blood, chest pain, palpitation.  Sputum now white Noted polycythemia hemoglobin 17.  She declined referral and describes needing phlebotomies for polycythemia several years ago. Using rescue inhaler 2 or 3 times daily, nebulizer up to 4 times daily when sick, now down to once or twice daily.  Asked refill Symbicort.  04/07/2018- 62 year old female smoker  followed for chronic bronchitis/COPD, chronic hypoxic respiratory failure, tobacco use (1 PPD/45 pack years), OSA, obesity hypoventilation, complicated by psoriasis/ MTX  O2 2-3 L sleep/exertion, CPAP 13/[facility]  -----FOLLOWS FOR: Breathing is worse since last OV. Reports increased SOB, wheezing and coughing. Denies chest  tightness. The compressor in her CPAP machine went bad and she couldn't use the machine for a few days but this has since been replaced. albuterol HFA, neb albuterol, Symbicort 160 Worse x2 weeks.  Cough productive yellow.  Fevers to 100 degrees.  Husband reports oxygen saturation repeatedly in the 80s on 3 L.  She describes muscle twitching which may be from acidosis. Trelegy did not work well enough-she cannot inhale deeply enough to benefit.  She is used Symbicort but did not restart it. She was blaming new medicine Siliq injection prescribed for psoriasis so she stopped that 2 weeks ago while continuing methotrexate.  Has not smoked in a week and intends to stay off.  Review of Systems-see HPI   + = positive Constitutional:   No-   weight loss, night sweats, fevers, chills, fatigue, lassitude. HEENT:    headaches, difficulty swallowing, tooth/dental problems, sore throat,        sneezing, itching,  ear ache,  +nasal congestion, +post nasal drip,  CV:  No-   chest pain, orthopnea, PND, +swelling in lower extremities, anasarca,   dizziness, palpitations Resp: +  shortness of breath with exertion or at rest.              + productive cough,  + non-productive cough,  No- coughing up of blood.             +  change in color of mucus.  + wheezing.   Skin: psoriasis GI:  No-   heartburn, indigestion, abdominal pain, nausea, vomiting,  GU: No-    MS:  + joint pain or swelling. . Neuro-     nothing unusual Psych:  No- change in mood or affect. No depression or anxiety.  No memory loss.   Objective:   Physical Exam General- Alert, Oriented, Affect-appropriate, Distress- none acute, + **Note De-Identified [place]ino Obfuscation** obese. On 3L O2 sat 92% Skin- +extensive psoriasis plaques Lymphadenopathy- none Head- atraumatic            Eyes- Gross vision intact, PERRLA, conjunctivae clear secretions            Ears- Hearing ok            Nose- + turbinate edema, no-Septal dev, mucus, polyps, erosion, perforation             Throat-  Mallampati III , mucosa clear , drainage- none, tonsils- atrophic.                        +dentures Neck- flexible , trachea midline, no stridor , thyroid nl, carotid no bruit Chest - symmetrical excursion , unlabored           Heart/CV- RRR , no murmur , no gallop  , no rub, nl s1 s2                           - JVD- none , edema- none, stasis dermatitis changes+ bilateral,                                 varices- none           Lung- Diminished but quiet without rhonchi,  Wheeze- none, dullness-none,                           rub- none, + shallow tachypnea, cough + raspy           Chest wall-  Abd-  Br/ Gen/ Rectal- Not done, not indicated Extrem- +apparent lipoma right medial knee with stasis changes,  Neuro- grossly intact to observation

## 2018-04-07 NOTE — Patient Instructions (Signed)
**Note De-Identified [place] Obfuscation** My nurse will call your drug store to discontinue Trelegy, since you don't feel you can inhale it well enough  Scripts sent refilling Symbicort and starting augmentin antibiotic  Order- CXR   Dx COPD exacerbation  Order- neb xop 0.63             Depo 80  If you get worse, go to the ER  You can use your nebulizer machine up to every 6 hours if needed

## 2018-04-07 NOTE — Telephone Encounter (Signed)
**Note De-identified [place] Obfuscation** Result note done.

## 2018-04-07 NOTE — Assessment & Plan Note (Signed)
**Note De-Identified [place]con[place] Obfuscation** Far advanced COPD with a chronic bronchitis component.  Cannot exclude some pneumonia. Plan-Augmentin, CBC with differential, CXR

## 2018-04-07 NOTE — Assessment & Plan Note (Signed)
**Note De-Identified Peto Obfuscation** Probably has hypoxia and hypercapnia, limiting her ability to turn up oxygen safely. Plan-CXR, CBC, BMET

## 2018-04-07 NOTE — Telephone Encounter (Signed)
**Note De-Identified [place] Obfuscation** Spoke with Diane at [facility], call report on pt's cxr obtained today:  IMPRESSION: New left lung base opacity compatible with pneumonia involving the lingula and/or anterior lower lobe. No pleural effusion. Followup PA and lateral chest X-ray is recommended in 3-4 weeks following trial of antibiotic therapy to ensure resolution and exclude underlying malignancy.  Full report available in Epic. CY please advise on recs.  Thanks!

## 2018-04-10 DIAGNOSIS — L400 Psoriasis vulgaris: Secondary | ICD-10-CM | POA: Diagnosis not present

## 2018-04-10 DIAGNOSIS — J449 Chronic obstructive pulmonary disease, unspecified: Secondary | ICD-10-CM | POA: Diagnosis not present

## 2018-04-10 DIAGNOSIS — T85511A Breakdown (mechanical) of esophageal anti-reflux device, initial encounter: Secondary | ICD-10-CM | POA: Diagnosis not present

## 2018-04-10 DIAGNOSIS — G4733 Obstructive sleep apnea (adult) (pediatric): Secondary | ICD-10-CM | POA: Diagnosis not present

## 2018-04-17 ENCOUNTER — Other Ambulatory Visit: Payer: Self-pay | Admitting: Family Medicine

## 2018-05-03 DIAGNOSIS — G4733 Obstructive sleep apnea (adult) (pediatric): Secondary | ICD-10-CM | POA: Diagnosis not present

## 2018-05-08 ENCOUNTER — Ambulatory Visit: Payer: Medicare Other | Admitting: Internal Medicine

## 2018-05-08 ENCOUNTER — Ambulatory Visit (INDEPENDENT_AMBULATORY_CARE_PROVIDER_SITE_OTHER)
Admission: RE | Admit: 2018-05-08 | Discharge: 2018-05-08 | Disposition: A | Discharge status: Home / Self Care | Payer: Medicare Other | Source: Ambulatory Visit | Attending: Internal Medicine | Admitting: Internal Medicine

## 2018-05-08 ENCOUNTER — Encounter: Payer: Self-pay | Admitting: Internal Medicine

## 2018-05-08 VITALS — BP 124/82 | HR 110 | Ht 65.0 in | Wt 242.8 lb

## 2018-05-08 DIAGNOSIS — J189 Pneumonia, unspecified organism: Secondary | ICD-10-CM

## 2018-05-08 DIAGNOSIS — J449 Chronic obstructive pulmonary disease, unspecified: Secondary | ICD-10-CM | POA: Diagnosis not present

## 2018-05-08 DIAGNOSIS — J9611 Chronic respiratory failure with hypoxia: Secondary | ICD-10-CM | POA: Diagnosis not present

## 2018-05-08 DIAGNOSIS — Z720 Tobacco use: Secondary | ICD-10-CM

## 2018-05-08 DIAGNOSIS — J181 Lobar pneumonia, unspecified organism: Secondary | ICD-10-CM

## 2018-05-08 NOTE — Progress Notes (Signed)
**Note De-Identified Blank Obfuscation** Patient ID: Hailey Hernandez, female    DOB: 1956-03-24, 62 y.o.   MRN: [id number]  HPI female smoker followed for chronic bronchitis/COPD, chronic hypoxic respiratory failure, tobacco use (1 PPD/45 pack years), OSA, obesity hypoventilation, complicated by psoriasis O2 2-3 L sleep/exertion, CPAP 13/[employer] Office Spirometry-06/07/17-severe obstructive airways disease, severe restriction of exhaled volume. FVC 1.18/37%, FEV1 0.90/36%, ratio 0.76, FEF 25-75% 0.73/33% ----------------------------------------------------------------------------------------- 04/07/2018- 62 year old female smoker  followed for chronic bronchitis/COPD, chronic hypoxic respiratory failure, tobacco use (1 PPD/45 pack years), OSA, obesity hypoventilation, complicated by psoriasis/ MTX  O2 2-3 L sleep/exertion, CPAP 13/[facility]  -----FOLLOWS FOR: Breathing is worse since last OV. Reports increased SOB, wheezing and coughing. Denies chest tightness. The compressor in her CPAP machine went bad and she couldn't use the machine for a few days but this has since been replaced. albuterol HFA, neb albuterol, Symbicort 160 Worse x2 weeks.  Cough productive yellow.  Fevers to 100 degrees.  Husband reports oxygen saturation repeatedly in the 80s on 3 L.  She describes muscle twitching which may be from acidosis. Trelegy did not work well enough-she cannot inhale deeply enough to benefit.  She is used Symbicort but did not restart it. She was blaming new medicine Siliq injection prescribed for psoriasis so she stopped that 2 weeks ago while continuing methotrexate.  Has not smoked in a week and intends to stay off.  05/08/2018- 62 year old female smoker  followed for chronic bronchitis/COPD, chronic hypoxic respiratory failure, tobacco use (1 PPD/45 pack years), OSA, obesity hypoventilation, complicated by psoriasis/ MTX  O2 2-3 L sleep/exertion, CPAP 13/[facility] -----1 month follow up. Patient states she has been feeling much better since last  visit.  Labs 04/07/2018-WBC 8800, hemoglobin 15.2, platelets 135 k, Clearly feeling much better.  Remains oxygen dependent at 2-3 L. Quit smoking as of July 3!!-Warmly congratulated and encouraged to stick with it. She is going to talk with her rheumatologist about restarting methotrexate for psoriasis CXR 04/07/2018- IMPRESSION: New left lung base opacity compatible with pneumonia involving the lingula and/or anterior lower lobe. No pleural effusion. Followup PA and lateral chest X-ray is recommended in 3-4 weeks, bicarb 47 following trial of antibiotic therapy to ensure resolution and exclude underlying malignancy.  Review of Systems-see HPI   + = positive Constitutional:   No-   weight loss, night sweats, fevers, chills, fatigue, lassitude. HEENT:    headaches, difficulty swallowing, tooth/dental problems, sore throat,        sneezing, itching,  ear ache,  +nasal congestion, +post nasal drip,  CV:  No-   chest pain, orthopnea, PND, swelling in lower extremities, anasarca,   dizziness, palpitations Resp: +  shortness of breath with exertion or at rest.               productive cough,  + non-productive cough,  No- coughing up of blood.               change in color of mucus.  + wheezing.   Skin: +psoriasis GI:  No-   heartburn, indigestion, abdominal pain, nausea, vomiting,  GU: No-    MS:  + joint pain or swelling. . Neuro-     nothing unusual Psych:  No- change in mood or affect. No depression or anxiety.  No memory loss.   Objective:   Physical Exam General- Alert, Oriented, Affect-appropriate, Distress- none acute, +obese. On 3L O2 sat 92% Skin- +extensive psoriasis plaques Lymphadenopathy- none Head- atraumatic **Note De-Identified [place] Obfuscation** Eyes- Gross vision intact, PERRLA, conjunctivae clear secretions            Ears- Hearing ok            Nose- + turbinate edema, no-Septal dev, mucus, polyps, erosion, perforation             Throat- Mallampati III , mucosa clear , drainage- none, tonsils-  atrophic.                        +dentures Neck- flexible , trachea midline, no stridor , thyroid nl, carotid no bruit Chest - symmetrical excursion , unlabored           Heart/CV- RRR , no murmur , no gallop  , no rub, nl s1 s2                           - JVD- none , edema- none, stasis dermatitis changes+ bilateral,                                 varices- none           Lung- Diminished, + few rhonchi,  Wheeze- none, dullness-none,                                                    rub- none, , cough + raspy           Chest wall-  Abd-  Br/ Gen/ Rectal- Not done, not indicated Extrem- +apparent lipoma right medial knee with +stasis changes,  Neuro- grossly intact to observation

## 2018-05-08 NOTE — Patient Instructions (Signed)
**Note De-Identified [place] Obfuscation** Order- CXR   Dx COPD mixed type, pneumonia   Ok to continue your oxygen and current meds  Please call if we can help

## 2018-05-09 DIAGNOSIS — J189 Pneumonia, unspecified organism: Secondary | ICD-10-CM | POA: Insufficient documentation

## 2018-05-09 NOTE — Assessment & Plan Note (Signed)
**Note De-Identified [place] Obfuscation** Very much improved clinically.  Completed antibiotics as instructed.  Cough is no longer productive. Plan-CXR for comparison

## 2018-05-09 NOTE — Assessment & Plan Note (Addendum)
**Note De-Identified [place] Obfuscation** She remains dependent on oxygen.  We have discussed CO2 retention.

## 2018-05-09 NOTE — Assessment & Plan Note (Signed)
**Note De-Identified [place] Obfuscation** I am very excited that she has been able to stop smoking and hope she can remain abstinent.  This was strongly reinforced.

## 2018-05-11 DIAGNOSIS — T85511A Breakdown (mechanical) of esophageal anti-reflux device, initial encounter: Secondary | ICD-10-CM | POA: Diagnosis not present

## 2018-05-11 DIAGNOSIS — L400 Psoriasis vulgaris: Secondary | ICD-10-CM | POA: Diagnosis not present

## 2018-05-11 DIAGNOSIS — J449 Chronic obstructive pulmonary disease, unspecified: Secondary | ICD-10-CM | POA: Diagnosis not present

## 2018-05-11 DIAGNOSIS — G4733 Obstructive sleep apnea (adult) (pediatric): Secondary | ICD-10-CM | POA: Diagnosis not present

## 2018-05-19 DIAGNOSIS — Z5181 Encounter for therapeutic drug level monitoring: Secondary | ICD-10-CM | POA: Diagnosis not present

## 2018-05-19 DIAGNOSIS — Z79899 Other long term (current) drug therapy: Secondary | ICD-10-CM | POA: Diagnosis not present

## 2018-05-19 DIAGNOSIS — L409 Psoriasis, unspecified: Secondary | ICD-10-CM | POA: Diagnosis not present

## 2018-05-19 DIAGNOSIS — Z720 Tobacco use: Secondary | ICD-10-CM | POA: Diagnosis not present

## 2018-05-19 DIAGNOSIS — J449 Chronic obstructive pulmonary disease, unspecified: Secondary | ICD-10-CM | POA: Diagnosis not present

## 2018-06-03 DIAGNOSIS — G4733 Obstructive sleep apnea (adult) (pediatric): Secondary | ICD-10-CM | POA: Diagnosis not present

## 2018-06-06 ENCOUNTER — Emergency Department (HOSPITAL_COMMUNITY): Payer: Medicare Other

## 2018-06-06 ENCOUNTER — Encounter (HOSPITAL_COMMUNITY): Payer: Self-pay | Admitting: Emergency Medicine

## 2018-06-06 ENCOUNTER — Encounter: Payer: Self-pay | Admitting: Family Medicine

## 2018-06-06 ENCOUNTER — Emergency Department (HOSPITAL_COMMUNITY)
Admission: EM | Admit: 2018-06-06 | Discharge: 2018-06-06 | Disposition: A | Discharge status: Home / Self Care | Payer: Medicare Other | Attending: Emergency Medicine | Admitting: Emergency Medicine

## 2018-06-06 ENCOUNTER — Ambulatory Visit (INDEPENDENT_AMBULATORY_CARE_PROVIDER_SITE_OTHER): Payer: Medicare Other | Admitting: Family Medicine

## 2018-06-06 VITALS — BP 210/107 | HR 76 | Temp 97.1°F | Ht 65.0 in | Wt 251.0 lb

## 2018-06-06 DIAGNOSIS — R51 Headache: Secondary | ICD-10-CM | POA: Insufficient documentation

## 2018-06-06 DIAGNOSIS — I10 Essential (primary) hypertension: Secondary | ICD-10-CM

## 2018-06-06 DIAGNOSIS — J449 Chronic obstructive pulmonary disease, unspecified: Secondary | ICD-10-CM | POA: Insufficient documentation

## 2018-06-06 DIAGNOSIS — J9611 Chronic respiratory failure with hypoxia: Secondary | ICD-10-CM | POA: Diagnosis not present

## 2018-06-06 DIAGNOSIS — R0789 Other chest pain: Secondary | ICD-10-CM | POA: Diagnosis not present

## 2018-06-06 DIAGNOSIS — E119 Type 2 diabetes mellitus without complications: Secondary | ICD-10-CM

## 2018-06-06 DIAGNOSIS — I160 Hypertensive urgency: Secondary | ICD-10-CM | POA: Diagnosis not present

## 2018-06-06 DIAGNOSIS — Z87891 Personal history of nicotine dependence: Secondary | ICD-10-CM | POA: Insufficient documentation

## 2018-06-06 DIAGNOSIS — R519 Headache, unspecified: Secondary | ICD-10-CM

## 2018-06-06 DIAGNOSIS — R112 Nausea with vomiting, unspecified: Secondary | ICD-10-CM | POA: Diagnosis not present

## 2018-06-06 DIAGNOSIS — Z79899 Other long term (current) drug therapy: Secondary | ICD-10-CM | POA: Insufficient documentation

## 2018-06-06 DIAGNOSIS — R079 Chest pain, unspecified: Secondary | ICD-10-CM | POA: Diagnosis not present

## 2018-06-06 LAB — BASIC METABOLIC PANEL
Anion gap: 9 (ref 5–15)
BUN: 24 mg/dL — ABNORMAL HIGH (ref 8–23)
CO2: 33 mmol/L — ABNORMAL HIGH (ref 22–32)
Calcium: 9.7 mg/dL (ref 8.9–10.3)
Chloride: 99 mmol/L (ref 98–111)
Creatinine, Ser: 0.91 mg/dL (ref 0.44–1.00)
GFR calc Af Amer: >60 mL/min (ref >60)
GFR calc non Af Amer: >60 mL/min (ref >60)
Glucose, Bld: 102 mg/dL — ABNORMAL HIGH (ref 70–99)
Potassium: 4.2 mmol/L (ref 3.5–5.1)
Sodium: 141 mmol/L (ref 135–145)

## 2018-06-06 LAB — CBC
HCT: 40.7 % (ref 36.0–46.0)
Hemoglobin: 12.8 g/dL (ref 12.0–15.0)
MCH: 32 pg (ref 26.0–34.0)
MCHC: 31.4 g/dL (ref 30.0–36.0)
MCV: 101.8 fL — ABNORMAL HIGH (ref 78.0–100.0)
Platelets: 185 10*3/uL (ref 150–400)
RBC: 4 MIL/uL (ref 3.87–5.11)
RDW: 15.3 % (ref 11.5–15.5)
WBC: 7.4 10*3/uL (ref 4.0–10.5)

## 2018-06-06 LAB — I-STAT TROPONIN, ED: Troponin i, poc: 0.03 ng/mL (ref 0.00–0.08)

## 2018-06-06 LAB — BAYER DCA HB A1C WAIVED: HB A1C: 5.4 % (ref <7.0)

## 2018-06-06 MED ORDER — LISINOPRIL 10 MG PO TABS
10.0000 mg | ORAL_TABLET | Freq: Once | ORAL | Status: AC
  Administered 2018-06-06: 10 mg via ORAL
  Filled 2018-06-06: qty 1

## 2018-06-06 MED ORDER — HYDROCHLOROTHIAZIDE 12.5 MG PO CAPS
12.5000 mg | ORAL_CAPSULE | Freq: Once | ORAL | Status: AC
  Administered 2018-06-06: 12.5 mg via ORAL
  Filled 2018-06-06: qty 1

## 2018-06-06 NOTE — Patient Instructions (Signed)
**Note De-Identified Chakraborty Obfuscation** Go immediately to the emergency department.  I offered to transport by EMS but you declined this today.  I am concerned given how high your blood pressure is, you having nausea and atypical chest pain that this needs to be controlled in an inpatient setting.  I have contacted the triage nurse at [facility] emergency department and they are aware that you were coming by private transport.

## 2018-06-06 NOTE — Progress Notes (Signed)
**Note De-Identified [place] Obfuscation** I called patient's nephrologist today and discussed recent office visit with the nurse there.  Per the office note in May, which was actually the last time patient saw the nephrology PA, lisinopril was recommended to be held because patient was having diarrheal episodes and there was fear for dehydration.  She had a repeat BMP, which did demonstrate slight improvement in creatinine.  Per her report, lisinopril/HCTZ okay to use.  Rosalita Carey M. [name], [facility]

## 2018-06-06 NOTE — ED Provider Notes (Signed)
**Note De-Identified [place] Obfuscation** [facility] DEPT Provider Note   CSN: [id number] Arrival date & time: 06/06/18  1453     History   Chief Complaint Chief Complaint  Patient presents with  . Hypertension    HPI Hailey Hernandez is a 62 y.o. female with history of anxiety, allergies, COPD, diverticulitis, DJD, fibromyalgia, GERD, hypertension, hyperlipidemia, OSA, and tobacco abuse presents for evaluation of hypertension.  Patient was sent from her doctor's office whom she saw for follow-up earlier today.  She told her physician that yesterday she developed a frontal headache consistent with migraines that she had several years ago when she was younger.  Headache began yesterday morning and resolved with Aleve.  She notes 2 episodes of nonbloody nonbilious emesis and nausea which lasted all day yesterday.  She did note feeling "swimmy headed "and as though she had some difficulty ambulating.  She states the headache and the remainder of her symptoms has entirely resolved.  No fevers.  She denies any associated numbness, weakness, vision changes, slurred speech, or syncope.  No aggravating or relieving factors noted.  No recent history of trauma.  She also noted right-sided chest pain located to a focal area of the anterior chest for the past 6 months or so.  She denies any shortness of breath and is on 3 L of oxygen [place] nasal cannula at home.  Found to be hypertensive at her PCPs office.  Her lisinopril was discontinued by her nephrologist and May and she is not taking any other antihypertensive medications.  She does not check her blood pressures at home.  Sent for further evaluation of her hypertension.  The history is provided by the patient.    Past Medical History:  Diagnosis Date  . Allergy   . Anxiety disorder   . Arthritis    bil legs  . Asthma   . Cancer ([place])   . Chronic bronchitis   . COPD (chronic obstructive pulmonary disease) ([place])   . Depression   . Diverticulitis   . DJD  (degenerative joint disease)    spine  . Fibromyalgia   . GERD (gastroesophageal reflux disease)   . Glaucoma   . History of thyroid cancer   . HTN (hypertension)    no longer on BP medication  . Hyperlipidemia   . Impaired glucose tolerance 05/08/2013  . OSA (obstructive sleep apnea)    CPAP  last sleep study 8-10 yr.ag0  . Polymyalgia ([place])   . Psoriasis   . Rhinitis   . Tobacco abuse     Patient Active Problem List   Diagnosis Date Noted  . Pneumonia, community acquired 05/09/2018  . Trigger finger of right thumb 06/21/2017  . Bronchitis 03/27/2017  . High risk medication use 01/18/2017  . Chronic respiratory failure with hypoxia ([place]) 11/30/2016  . Venous stasis 05/28/2016  . T2DM (type 2 diabetes mellitus) ([place]) 05/18/2016  . Morbid obesity ([place]) 05/18/2016  . Ulcer of skin ([place]) 05/07/2016  . Polycythemia, secondary 06/10/2015  . Allergic rhinitis 12/03/2014  . Psoriasis with arthropathy ([place]) 05/17/2014  . Chronic venous insufficiency 07/16/2013  . Leg ulcer ([place]) 07/13/2013  . Acute gouty arthritis 07/10/2013  . Bilateral leg pain 06/05/2013  . Bilateral knee pain 06/05/2013  . Left arm pain 06/05/2013  . Medial epicondylitis of left elbow 05/08/2013  . Peripheral edema 05/08/2013  . Abdominal swelling 11/07/2012  . Allergic conjunctivitis 11/07/2012  . Rotator cuff syndrome of right shoulder 07/23/2012  . Tendonitis, calcific, shoulder 07/23/2012  . **Note De-Identified [place] Obfuscation** Back pain 04/26/2012  . Cervical disc disease 04/25/2012  . Preventative health care 04/19/2012  . Tachyarrhythmia   . History of thyroid cancer   . Polymyalgia ([place])   . Diverticulitis   . DJD (degenerative joint disease)   . COPD mixed type ([place])   . Hyperlipidemia   . SYNCOPE 12/13/2010  . GERD 08/09/2010  . Other dysphagia 08/09/2010  . Psoriasis 07/16/2010  . INSOMNIA 07/14/2010  . Tobacco user 02/09/2010  . Anxiety 12/30/2009  . Obstructive sleep apnea 12/30/2009  . CARPAL TUNNEL SYNDROME,  BILATERAL 12/30/2009  . Essential hypertension 12/30/2009  . BREAST MASS, LEFT 12/30/2009  . Fibromyalgia 12/30/2009  . Osteoporosis 12/30/2009  . LIVER FUNCTION TESTS, ABNORMAL, HX OF 12/30/2009  . Obesity hypoventilation syndrome ([place]) 12/30/2009    Past Surgical History:  Procedure Laterality Date  . APPENDECTOMY    . CARPAL TUNNEL RELEASE  10/30/2011   Procedure: CARPAL TUNNEL RELEASE;  Surgeon: Wynonia Sours, MD;  Location: [facility];  Service: Orthopedics;  Laterality: Right;  and mass excision  . CHOLECYSTECTOMY    . CHOLESTEATOMA EXCISION     right ear  . left shoulder spurs    . ORIF right lower leg    . partial throidectomy    . TOTAL ABDOMINAL HYSTERECTOMY       OB History   None      Home Medications    Prior to Admission medications   Medication Sig Start Date End Date Taking? Authorizing Provider  albuterol (PROVENTIL HFA;VENTOLIN HFA) 108 (90 Base) MCG/ACT inhaler Inhale 2 puffs into the lungs every 6 (six) hours as needed for wheezing or shortness of breath. 11/30/16  Yes Young, Tarri Fuller D, MD  albuterol (PROVENTIL) (2.5 MG/3ML) 0.083% nebulizer solution Take 3 mLs (2.5 mg total) by nebulization every 4 (four) hours as needed for wheezing or shortness of breath. DX: 496 03/15/17  Yes Baird Lyons D, MD  aspirin EC 81 MG tablet Take 81 mg by mouth daily.   Yes [provider]  atorvastatin (LIPITOR) 80 MG tablet TAKE 1 TABLET EVERY DAY 03/28/18  Yes Timmothy Euler, MD  budesonide-formoterol [facility]) 160-4.5 MCG/ACT inhaler Inhale 2 puffs then rinse mouth, twice daily Patient taking differently: Inhale 2 puffs into the lungs daily.  04/07/18  Yes Young, Tarri Fuller D, MD  colchicine 0.6 MG tablet Take 0.6 mg by mouth daily.   Yes [provider]  famotidine (PEPCID) 20 MG tablet TAKE  (1)  TABLET TWICE A DAY. Patient taking differently: Take 20 mg by mouth 2 (two) times daily.  04/18/18  Yes Timmothy Euler, MD  fluticasone  (FLONASE) 50 MCG/ACT nasal spray Place 2 sprays into both nostrils daily. 04/08/17  Yes Timmothy Euler, MD  folic acid (FOLVITE) 1 MG tablet Take 1 mg by mouth daily.    Yes [provider]  methotrexate (RHEUMATREX) 2.5 MG tablet Take 20 mg by mouth once a week. Monday   Yes [provider]  montelukast (SINGULAIR) 10 MG tablet TAKE 1 TABLET EVERY DAY 04/18/18  Yes Timmothy Euler, MD  Nebulizers (COMPRESSOR/NEBULIZER) MISC Use up to 4 times daily when needed 02/05/11   Deneise Lever, MD  doxepin (SINEQUAN) 10 MG capsule 1-2 at bedtime   12/10/17  [provider]  fexofenadine (ALLEGRA) 180 MG tablet Take 180 mg by mouth daily.    12/10/17  [provider]    Family History Family History  Problem Relation Age of Onset  . **Note De-Identified [place] Obfuscation** Emphysema Sister   . Hypertension Sister   . Heart attack Sister   . Emphysema Sister   . Alpha-1 antitrypsin deficiency Sister   . Alpha-1 antitrypsin deficiency Sister   . Allergies Sister   . Asthma Sister   . Heart disease Mother   . Cancer Mother   . Hyperlipidemia Mother   . Hypertension Mother   . Heart attack Mother   . Heart disease Father   . Heart attack Father   . Cancer Sister   . Cancer Brother   . Hyperlipidemia Brother   . Hypertension Brother   . Tuberculosis Other        grandmother  . Hyperlipidemia Daughter   . Hypertension Daughter   . Hypertension Son     Social History Social History   Tobacco Use  . Smoking status: Former Smoker    Packs/day: 1.00    Years: 45.00    Pack years: 45.00    Types: Cigarettes    Last attempt to quit: 04/02/2018    Years since quitting: 0.1  . Smokeless tobacco: Never Used  Substance Use Topics  . Alcohol use: No  . Drug use: No     Allergies   Doxycycline; Tramadol; Milnacipran; and Apremilast   Review of Systems Review of Systems  Constitutional: Negative for chills and fever.  Eyes: Negative for photophobia and visual disturbance.  Respiratory:  Negative for shortness of breath.   Cardiovascular: Positive for chest pain (chronic, unchaged).  Gastrointestinal: Positive for nausea (resolved) and vomiting (resolved). Negative for abdominal pain.  Genitourinary: Negative for decreased urine volume and difficulty urinating.  Neurological: Positive for dizziness (resolved) and headaches (resolved). Negative for syncope, weakness and numbness.  All other systems reviewed and are negative.    Physical Exam Updated Vital Signs BP (!) 155/88   Pulse 96   Temp 98.8 F (37.1 C)   Resp (!) 21   SpO2 93%   Physical Exam  Constitutional: She is oriented to person, place, and time. She appears well-developed and well-nourished. No distress.  HENT:  Head: Normocephalic and atraumatic.  Eyes: Pupils are equal, round, and reactive to light. Conjunctivae and EOM are normal. Right eye exhibits no discharge. Left eye exhibits no discharge.  Neck: Normal range of motion. Neck supple. No JVD present. No tracheal deviation present.  Cardiovascular: Normal rate, regular rhythm and intact distal pulses.  2+ radial and DP/PT pulses bilaterally, Homans sign absent bilaterally, no lower extremity edema, no palpable cords, compartments are soft   Pulmonary/Chest: Effort normal. She exhibits tenderness.  On 3 L [place] nasal cannula.  SPO2 saturations 93 to 96%.  Scattered crackles and wheezes noted.  Speaking in full sentences without difficulty.  Focal tenderness to palpation of the right anterior chest wall around the pectoralis muscle insertion.  No deformity, crepitus, ecchymosis, or flail segment noted.  Abdominal: Soft. Bowel sounds are normal. She exhibits no distension. There is no tenderness. There is no guarding.  Musculoskeletal: She exhibits no edema.  Neurological: She is alert and oriented to person, place, and time. No cranial nerve deficit or sensory deficit. She exhibits normal muscle tone.  Mental Status:  Alert, thought content appropriate,  able to give a coherent history. Speech fluent without evidence of aphasia. Able to follow 2 step commands without difficulty.  Cranial Nerves:  II:  Peripheral visual fields grossly normal, pupils equal, round, reactive to light III,IV, VI: ptosis not present, extra-ocular motions intact bilaterally  V,VII: smile symmetric, facial light touch **Note De-Identified [place] Obfuscation** sensation equal VIII: hearing grossly normal to voice  X: uvula elevates symmetrically  XI: bilateral shoulder shrug symmetric and strong XII: midline tongue extension without fassiculations Motor:  Normal tone. 5/5 strength of BUE and BLE major muscle groups including strong and equal grip strength and dorsiflexion/plantar flexion Sensory: light touch normal in all extremities. Gait: normal gait and balance. No pronator drift.    Skin: Skin is warm and dry. No erythema.  Psychiatric: She has a normal mood and affect. Her behavior is normal.  Nursing note and vitals reviewed.    ED Treatments / Results  Labs (all labs ordered are listed, but only abnormal results are displayed) Labs Reviewed  BASIC METABOLIC PANEL - Abnormal; Notable for the following components:      Result Value   CO2 33 (*)    Glucose, Bld 102 (*)    BUN 24 (*)    All other components within normal limits  CBC - Abnormal; Notable for the following components:   MCV 101.8 (*)    All other components within normal limits  I-STAT TROPONIN, ED    EKG EKG Interpretation  Date/Time:  Friday June 06 2018 16:43:13 EDT Ventricular Rate:  76 PR Interval:    QRS Duration: 120 QT Interval:  408 QTC Calculation: 459 R Axis:   66 Text Interpretation:  Sinus rhythm Probable left atrial enlargement Nonspecific intraventricular conduction delay No significant change since last tracing Confirmed by Davonna Belling [phone]) on 06/06/2018 6:47:27 PM   Radiology Dg Chest 2 View  Result Date: 06/06/2018 CLINICAL DATA:  Chest pain EXAM: CHEST - 2 VIEW COMPARISON:   05/08/2018, 04/07/2017 FINDINGS: Mild cardiomegaly. No focal opacity or pleural effusion. Aortic atherosclerosis. No pneumothorax. IMPRESSION: No active cardiopulmonary disease.  Mild cardiomegaly Electronically Signed   By: Donavan Foil M.D.   On: 06/06/2018 17:03   Ct Head Wo Contrast  Result Date: 06/06/2018 CLINICAL DATA:  Headache EXAM: CT HEAD WITHOUT CONTRAST TECHNIQUE: Contiguous axial images were obtained from the base of the skull through the vertex without intravenous contrast. COMPARISON:  None. FINDINGS: Brain: No acute territorial infarction, hemorrhage or intracranial mass. Mild small vessel ischemic changes of the white matter. Normal ventricle size. Vascular: No hyperdense vessels.  Scattered carotid calcification. Skull: Normal. Negative for fracture or focal lesion. Sinuses/Orbits: No acute finding. Other: None IMPRESSION: 1. No CT evidence for acute intracranial abnormality. 2. Mild small vessel ischemic changes of the white matter Electronically Signed   By: Donavan Foil M.D.   On: 06/06/2018 18:37    Procedures Procedures (including critical care time)  Medications Ordered in ED Medications  lisinopril (PRINIVIL,ZESTRIL) tablet 10 mg (10 mg Oral Given 06/06/18 1744)  hydrochlorothiazide (MICROZIDE) capsule 12.5 mg (12.5 mg Oral Given 06/06/18 1744)     Initial Impression / Assessment and Plan / ED Course  I have reviewed the triage vital signs and the nursing notes.  Pertinent labs & imaging results that were available during my care of the patient were reviewed by me and considered in my medical decision making (see chart for details).     Patient sent from PCP to ED for evaluation of hypertension.  She had a headache yesterday but none today.  She is afebrile, hypertensive in the ED with improvement after administration of her home medicines.  Her lisinopril/HCTZ was discontinued in May after follow-up with her nephrologist.  PCP today followed up on this and states that  she is cleared to resume her lisinopril/HCTZ.  Lab work reviewed **Note De-Identified [place] Obfuscation** by me shows no leukocytosis.  Creatinine within normal limits.  She is not significantly anemic, no metabolic derangements.  EKG shows no significant changes from last tracing, no new ischemic changes.  Chest x-ray shows no acute cardiopulmonary abnormalities and head CT shows no evidence of acute intracranial abnormality but she does have mild small vessel ischemic changes.  Normal neuro exam and she is completely asymptomatic at this time.  She denies chest pain, shortness of breath, or any other symptoms presently.  She does have chronic right-sided chest pain which is reproducible on palpation and appears more consistent with musculoskeletal etiology.  Highly doubt ACS/MI.  Initial troponin was drawn in triage, although I do not feel that her symptoms are cardiac in etiology and I do not think that she needs a second troponin as her symptoms have been constant.  She is ambulatory without difficulty.  No evidence of CVA, AKI, or other endorgan damage.  Stable for discharge home with resumption of her lisinopril/HCTZ and follow-up with her cardiologist and PCP for reevaluation.  Discussed strict ED return precautions.  Patient and patient's husband verbalized understanding of and agreement with plan and patient is stable for discharge home at this time.  Discussed with Dr. Alvino Chapel who agrees with assessment and plan at this time. Final Clinical Impressions(s) / ED Diagnoses   Final diagnoses:  Hypertension, unspecified type  Atypical chest pain  Frontal headache    ED Discharge Orders    None       Debroah Baller 06/06/18 2013    Davonna Belling, MD 06/06/18 2334

## 2018-06-06 NOTE — ED Triage Notes (Signed)
**Note De-Identified [place] Obfuscation** Patient sent here from dr office with complaints of hypertension and hypoxia. Normally on 3L. Reports that nephrologist took her off lisinopril.

## 2018-06-06 NOTE — Discharge Instructions (Signed)
**Note De-Identified [place] Obfuscation** Take your home medicines as prescribed.  Drink plenty of water and get plenty of rest.  Follow-up with your cardiologist for reevaluation of your symptoms and your primary care physician for further evaluation.  Your primary care physician contacted your kidney doctor and it sounds like you can restart your low pressure medicine (lisinopril/HCTZ).  Return to the emergency department immediately for any concerning signs or symptoms develop such as fevers, chest pain, worsening headaches, shortness of breath, decreased urine output, or passing out.  If your blood pressure (BP) was elevated on multiple readings during this visit above 130 for the top number or above 80 for the bottom number, please have this repeated by your primary care provider within one month. You can also check your blood pressure when you are out at a pharmacy or grocery store. Many have machines that will check your blood pressure.  If your blood pressure remains elevated, please follow-up with your PCP.

## 2018-06-06 NOTE — Progress Notes (Signed)
**Note De-Identified [place] Obfuscation** Subjective: CC: Type 2 diabetes PCP: [name], DO TIR:[id number] Hailey Hernandez is a 62 y.o. female presenting to clinic today for:  1.  Type 2 diabetes Patient here for her interval follow-up on type 2 diabetes.  She is been diet controlled.  She does report some dizziness and nausea with vomiting yesterday.  2.  Hypertension Patient reports that she had her lisinopril discontinued by her nephrologist.  Per her report, it is not been replaced and she has not been on any medications.  She reports some right-sided chest pain that is been ongoing for 6 months.  She denies any shortness of breath outside of baseline and actually is on 3 L of oxygen, which is below her typical 4 L of oxygen.  She notes that dizziness.  She has been seen by Dr. Stanford Breed with cardiology in [place] in April 2018 for tachycardia.  She had an echocardiogram performed which demonstrated normal systolic function with EF of 55 to 60%.  There was hypokinesis at the basal inferior myocardium with grade 1 diastolic dysfunction.  She reports that she was instructed to follow-up but never did.   ROS: Per HPI  Allergies  Allergen Reactions  . Doxycycline Shortness Of Breath and Other (See Comments)    Drug interaction with Soriatane  . Tramadol Shortness Of Breath    Did not work  . Milnacipran     REACTION: dizzy  . Apremilast Rash    Rash, able to take this currently   Past Medical History:  Diagnosis Date  . Allergy   . Anxiety disorder   . Arthritis    bil legs  . Asthma   . Cancer ([place])   . Chronic bronchitis   . COPD (chronic obstructive pulmonary disease) ([place])   . Depression   . Diverticulitis   . DJD (degenerative joint disease)    spine  . Fibromyalgia   . GERD (gastroesophageal reflux disease)   . Glaucoma   . History of thyroid cancer   . HTN (hypertension)    no longer on BP medication  . Hyperlipidemia   . Impaired glucose tolerance 05/08/2013  . OSA (obstructive sleep apnea)    CPAP  last sleep study 8-10 yr.ag0  . Polymyalgia ([place])   . Psoriasis   . Rhinitis   . Tobacco abuse     Current Outpatient Medications:  .  albuterol (PROVENTIL HFA;VENTOLIN HFA) 108 (90 Base) MCG/ACT inhaler, Inhale 2 puffs into the lungs every 6 (six) hours as needed for wheezing or shortness of breath., Disp: 1 Inhaler, Rfl: 12 .  albuterol (PROVENTIL) (2.5 MG/3ML) 0.083% nebulizer solution, Take 3 mLs (2.5 mg total) by nebulization every 4 (four) hours as needed for wheezing or shortness of breath. DX: 496, Disp: 180 vial, Rfl: 11 .  aspirin EC 81 MG tablet, Take 81 mg by mouth daily., Disp: , Rfl:  .  atorvastatin (LIPITOR) 80 MG tablet, TAKE 1 TABLET EVERY DAY, Disp: 90 tablet, Rfl: 0 .  budesonide-formoterol (SYMBICORT) 160-4.5 MCG/ACT inhaler, Inhale 2 puffs then rinse mouth, twice daily, Disp: 1 Inhaler, Rfl: 12 .  colchicine 0.6 MG tablet, Take 0.6 mg by mouth daily., Disp: , Rfl:  .  famotidine (PEPCID) 20 MG tablet, TAKE  (1)  TABLET TWICE A DAY., Disp: 60 tablet, Rfl: 4 .  fluticasone (FLONASE) 50 MCG/ACT nasal spray, Place 2 sprays into both nostrils daily., Disp: 16 g, Rfl: 6 .  folic acid (FOLVITE) 1 MG tablet, Take 1 mg by mouth **Note De-Identified [place] Obfuscation** daily. , Disp: , Rfl:  .  methotrexate (RHEUMATREX) 2.5 MG tablet, 25 mg once a week. On wednesday Caution:Chemotherapy. Protect from light., Disp: , Rfl:  .  montelukast (SINGULAIR) 10 MG tablet, TAKE 1 TABLET EVERY DAY, Disp: 90 tablet, Rfl: 0 .  Nebulizers (COMPRESSOR/NEBULIZER) MISC, Use up to 4 times daily when needed, Disp: 1 each, Rfl: 0 Social History   Socioeconomic History  . Marital status: Married    Spouse name: Not on file  . Number of children: 3  . Years of education: 9  . Highest education level: Not on file  Occupational History  . Occupation: disabled    Employer: DISABLED  Social Needs  . Financial resource strain: Not on file  . Food insecurity:    Worry: Not on file    Inability: Not on file  . Transportation needs:      Medical: Not on file    Non-medical: Not on file  Tobacco Use  . Smoking status: Former Smoker    Packs/day: 1.00    Years: 45.00    Pack years: 45.00    Types: Cigarettes    Last attempt to quit: 04/02/2018    Years since quitting: 0.1  . Smokeless tobacco: Never Used  Substance and Sexual Activity  . Alcohol use: No  . Drug use: No  . Sexual activity: Not on file  Lifestyle  . Physical activity:    Days per week: Not on file    Minutes per session: Not on file  . Stress: Not on file  Relationships  . Social connections:    Talks on phone: Not on file    Gets together: Not on file    Attends religious service: Not on file    Active member of club or organization: Not on file    Attends meetings of clubs or organizations: Not on file    Relationship status: Not on file  . Intimate partner violence:    Fear of current or ex partner: Not on file    Emotionally abused: Not on file    Physically abused: Not on file    Forced sexual activity: Not on file  Other Topics Concern  . Not on file  Social History Narrative   Married w/ children   Daily caffeine use   Family History  Problem Relation Age of Onset  . Emphysema Sister   . Hypertension Sister   . Heart attack Sister   . Emphysema Sister   . Alpha-1 antitrypsin deficiency Sister   . Alpha-1 antitrypsin deficiency Sister   . Allergies Sister   . Asthma Sister   . Heart disease Mother   . Cancer Mother   . Hyperlipidemia Mother   . Hypertension Mother   . Heart attack Mother   . Heart disease Father   . Heart attack Father   . Cancer Sister   . Cancer Brother   . Hyperlipidemia Brother   . Hypertension Brother   . Tuberculosis Other        grandmother  . Hyperlipidemia Daughter   . Hypertension Daughter   . Hypertension Son     Objective: Office vital signs reviewed. BP (!) 210/107   Pulse 76   Temp (!) 97.1 F (36.2 Hailey) (Oral)   Ht 5\' 5"  (1.651 m)   Wt 251 lb (113.9 kg)   BMI 41.77 kg/m    Physical Examination:  General: Awake, alert, chronically ill appearing. obese, No acute distress HEENT: Normal **Note De-Identified [place] Obfuscation** Eyes: PERRLA, extraocular membranes intact, sclera white Cardio: regular rate and rhythm, S1S2 heard, no murmurs appreciated Pulm: Globally decreased breath sounds.  She has mild bibasilar R[place].  Normal work of breathing on 3 L of oxygen by nasal cannula Extremities: Slightly cool,  No edema, cyanosis or clubbing; +1 pulses bilaterally; she does have a dusky appearance to the right lower extremity compared to the left. MSK: Slow gait and station; requires some assistance for ambulation Skin: Multiple psoriatic plaques along bilateral upper extremities and lower extremities Neuro: No focal neurologic deficits noted.  Assessment/ Plan: Hailey Hernandez is a 62 year old female who presented today for chronic follow-up of her diabetes and hypertension.  She is found to have accelerated hypertension with concern for possible hypertensive emergency.  1. Accelerated hypertension Concern for hypertensive emergency given reports of dizziness and nausea with vomiting yesterday.  I am uncertain as to why her blood pressure medication was not replaced.  We will work on getting records from nephrology.  Per her report, her creatinine had stabilized after discontinuation of the ACE inhibitor.  2. Atypical chest pain Patient with multiple risk factors including type 2 diabetes, CKD and uncontrolled hypertension.  I recommended that she be transported to the emergency department by EMS but patient declined and wished to go by private vehicle.  She has capacity.  She will be brought to [facility] long emergency department by her husband.  The triage nurse in the emergency department has been notified of their impending arrival.  3. Hypertensive urgency Given multiple comorbidities, would benefit from close monitoring and further work-up given symptoms  4. Diabetes mellitus without complication  ([place]) Controlled. - Bayer DCA Hb A1c Waived  5. Chronic respiratory failure with hypoxia ([facility]) Saturating 89% on 3 L of oxygen.   Orders Placed This Encounter  Procedures  . Bayer [facility] Hb A1c Waived      [place], [place] [phone]

## 2018-06-07 ENCOUNTER — Other Ambulatory Visit: Payer: Self-pay

## 2018-06-07 ENCOUNTER — Emergency Department (HOSPITAL_COMMUNITY)
Admission: EM | Admit: 2018-06-07 | Discharge: 2018-06-08 | Disposition: A | Discharge status: Home / Self Care | Payer: Medicare Other | Attending: Emergency Medicine | Admitting: Emergency Medicine

## 2018-06-07 ENCOUNTER — Encounter (HOSPITAL_COMMUNITY): Payer: Self-pay | Admitting: *Deleted

## 2018-06-07 DIAGNOSIS — Z87891 Personal history of nicotine dependence: Secondary | ICD-10-CM | POA: Insufficient documentation

## 2018-06-07 DIAGNOSIS — Z859 Personal history of malignant neoplasm, unspecified: Secondary | ICD-10-CM | POA: Diagnosis not present

## 2018-06-07 DIAGNOSIS — Z7982 Long term (current) use of aspirin: Secondary | ICD-10-CM | POA: Diagnosis not present

## 2018-06-07 DIAGNOSIS — Z79899 Other long term (current) drug therapy: Secondary | ICD-10-CM | POA: Insufficient documentation

## 2018-06-07 DIAGNOSIS — I160 Hypertensive urgency: Secondary | ICD-10-CM | POA: Diagnosis not present

## 2018-06-07 DIAGNOSIS — R000 Tachycardia, unspecified: Secondary | ICD-10-CM

## 2018-06-07 DIAGNOSIS — J45909 Unspecified asthma, uncomplicated: Secondary | ICD-10-CM | POA: Diagnosis not present

## 2018-06-07 DIAGNOSIS — R51 Headache: Secondary | ICD-10-CM | POA: Diagnosis not present

## 2018-06-07 DIAGNOSIS — R7989 Other specified abnormal findings of blood chemistry: Secondary | ICD-10-CM | POA: Diagnosis not present

## 2018-06-07 DIAGNOSIS — I10 Essential (primary) hypertension: Secondary | ICD-10-CM | POA: Diagnosis present

## 2018-06-07 DIAGNOSIS — R519 Headache, unspecified: Secondary | ICD-10-CM

## 2018-06-07 LAB — COMPREHENSIVE METABOLIC PANEL
ALBUMIN: 3.9 g/dL (ref 3.5–5.0)
ALK PHOS: 128 U/L — AB (ref 38–126)
ALT: 50 U/L — AB (ref 0–44)
AST: 68 U/L — AB (ref 15–41)
Anion gap: 14 (ref 5–15)
BILIRUBIN TOTAL: 0.9 mg/dL (ref 0.3–1.2)
BUN: 22 mg/dL (ref 8–23)
CALCIUM: 9.1 mg/dL (ref 8.9–10.3)
CO2: 29 mmol/L (ref 22–32)
CREATININE: 0.93 mg/dL (ref 0.44–1.00)
Chloride: 96 mmol/L — ABNORMAL LOW (ref 98–111)
GFR calc Af Amer: >60 mL/min (ref >60)
GFR calc non Af Amer: >60 mL/min (ref >60)
GLUCOSE: 162 mg/dL — AB (ref 70–99)
Potassium: 4.1 mmol/L (ref 3.5–5.1)
SODIUM: 139 mmol/L (ref 135–145)
TOTAL PROTEIN: 8.6 g/dL — AB (ref 6.5–8.1)

## 2018-06-07 LAB — CBC
HCT: 42.2 % (ref 36.0–46.0)
Hemoglobin: 13.5 g/dL (ref 12.0–15.0)
MCH: 32.8 pg (ref 26.0–34.0)
MCHC: 32 g/dL (ref 30.0–36.0)
MCV: 102.7 fL — ABNORMAL HIGH (ref 78.0–100.0)
PLATELETS: 159 10*3/uL (ref 150–400)
RBC: 4.11 MIL/uL (ref 3.87–5.11)
RDW: 15.5 % (ref 11.5–15.5)
WBC: 9.1 10*3/uL (ref 4.0–10.5)

## 2018-06-07 MED ORDER — METOCLOPRAMIDE HCL 5 MG/ML IJ SOLN
10.0000 mg | Freq: Once | INTRAMUSCULAR | Status: AC
  Administered 2018-06-08: 10 mg via INTRAVENOUS
  Filled 2018-06-07: qty 2

## 2018-06-07 MED ORDER — SODIUM CHLORIDE 0.9 % IV BOLUS
500.0000 mL | Freq: Once | INTRAVENOUS | Status: AC
  Administered 2018-06-08: 500 mL via INTRAVENOUS

## 2018-06-07 NOTE — ED Triage Notes (Signed)
**Note De-Identified [place] Obfuscation** Pt arrives with c/o hypertension. She says that she had been off her BP medications for about 5 months and she was restarted yesterday on her meds. Pt has had headaches and vomiting.

## 2018-06-08 ENCOUNTER — Emergency Department (HOSPITAL_COMMUNITY): Payer: Medicare Other

## 2018-06-08 DIAGNOSIS — R7989 Other specified abnormal findings of blood chemistry: Secondary | ICD-10-CM | POA: Diagnosis not present

## 2018-06-08 DIAGNOSIS — R51 Headache: Secondary | ICD-10-CM | POA: Diagnosis not present

## 2018-06-08 LAB — URINALYSIS, ROUTINE W REFLEX MICROSCOPIC
BACTERIA UA: NONE SEEN
Bilirubin Urine: NEGATIVE
Glucose, UA: NEGATIVE mg/dL
Hgb urine dipstick: NEGATIVE
Ketones, ur: NEGATIVE mg/dL
Leukocytes, UA: NEGATIVE
Nitrite: NEGATIVE
PH: 8 (ref 5.0–8.0)
Protein, ur: 100 mg/dL — AB
SPECIFIC GRAVITY, URINE: 1.011 (ref 1.005–1.030)

## 2018-06-08 LAB — TROPONIN I: TROPONIN I: 0.03 ng/mL — AB (ref <0.03)

## 2018-06-08 LAB — D-DIMER, QUANTITATIVE (NOT AT ARMC): D DIMER QUANT: 0.52 ug{FEU}/mL — AB (ref 0.00–0.50)

## 2018-06-08 MED ORDER — IOPAMIDOL (ISOVUE-370) INJECTION 76%
INTRAVENOUS | Status: AC
  Filled 2018-06-08: qty 100

## 2018-06-08 MED ORDER — HYDRALAZINE HCL 10 MG PO TABS: 10.0000 mg | ORAL_TABLET | Freq: Two times a day (BID) | ORAL | 0 refills | Status: DC | PRN

## 2018-06-08 MED ORDER — SODIUM CHLORIDE 0.9 % IJ SOLN
INTRAMUSCULAR | Status: AC
  Filled 2018-06-08: qty 50

## 2018-06-08 MED ORDER — IOPAMIDOL (ISOVUE-370) INJECTION 76%
100.0000 mL | Freq: Once | INTRAVENOUS | Status: AC | PRN
  Administered 2018-06-08: 100 mL via INTRAVENOUS

## 2018-06-08 NOTE — ED Notes (Signed)
**Note De-Identified [place] Obfuscation** Date and time results received: 06/08/18    Test: troponin Critical Value: 0.03  Name of Provider Notified: nanavati  Orders Received? Or Actions Taken?:acknowledged

## 2018-06-08 NOTE — ED Notes (Signed)
**Note De-Identified [place] Obfuscation** Pt able to ambulate to restroom with assistance from staff and spouse without difficulty.

## 2018-06-08 NOTE — ED Notes (Signed)
**Note De-Identified Apo Obfuscation** Pt did not want to attempt to ambulate because she said her head hurt too much.

## 2018-06-08 NOTE — ED Provider Notes (Signed)
**Note De-Identified Ng Obfuscation** Normal DEPT Provider Note   CSN: [id number] Arrival date & time: 06/07/18  2153     History   Chief Complaint Chief Complaint  Patient presents with  . Hypertension    HPI Hailey Hernandez is a 62 y.o. female.  HPI 62 year old comes in with chief complaint of elevated blood pressure. Patient has history of OSA, COPD, hypertension, psoriasis on methotrexate.  Patient reports that she has been having headache for 2 days.  Headaches are generalized.  Patient denies any associated numbness, tingling, vision changes, dizziness, slurred speech that are new.  Regular over-the-counter medication have not helped.  Additionally patient is complaining of generalized weakness.  She also has elevated blood pressure.  Patient was seen 2 days ago with elevated blood pressure and was started on lisinopril, hydrochlorothiazide combination.  According to the husband patient's BP remained high, therefore they were asked to come to the ER.  Review of system is negative for chest pain, shortness of breath.  Additionally, patient is noted to be tachycardic and slightly tachypneic.  She denies any cough, wheezing.  There is no history of PE, DVT however patient is living a sedentary lifestyle.  Patient does not have any new rash or UTI-like symptoms.  Past Medical History:  Diagnosis Date  . Allergy   . Anxiety disorder   . Arthritis    bil legs  . Asthma   . Cancer ([place])   . Chronic bronchitis   . COPD (chronic obstructive pulmonary disease) ([place])   . Depression   . Diverticulitis   . DJD (degenerative joint disease)    spine  . Fibromyalgia   . GERD (gastroesophageal reflux disease)   . Glaucoma   . History of thyroid cancer   . HTN (hypertension)    no longer on BP medication  . Hyperlipidemia   . Impaired glucose tolerance 05/08/2013  . OSA (obstructive sleep apnea)    CPAP  last sleep study 8-10 yr.ag0  . Polymyalgia ([place])   . Psoriasis   . Rhinitis     . Tobacco abuse     Patient Active Problem List   Diagnosis Date Noted  . Pneumonia, community acquired 05/09/2018  . Trigger finger of right thumb 06/21/2017  . Bronchitis 03/27/2017  . High risk medication use 01/18/2017  . Chronic respiratory failure with hypoxia ([place]) 11/30/2016  . Venous stasis 05/28/2016  . T2DM (type 2 diabetes mellitus) ([place]) 05/18/2016  . Morbid obesity ([place]) 05/18/2016  . Ulcer of skin ([place]) 05/07/2016  . Polycythemia, secondary 06/10/2015  . Allergic rhinitis 12/03/2014  . Psoriasis with arthropathy ([place]) 05/17/2014  . Chronic venous insufficiency 07/16/2013  . Leg ulcer ([place]) 07/13/2013  . Acute gouty arthritis 07/10/2013  . Bilateral leg pain 06/05/2013  . Bilateral knee pain 06/05/2013  . Left arm pain 06/05/2013  . Medial epicondylitis of left elbow 05/08/2013  . Peripheral edema 05/08/2013  . Abdominal swelling 11/07/2012  . Allergic conjunctivitis 11/07/2012  . Rotator cuff syndrome of right shoulder 07/23/2012  . Tendonitis, calcific, shoulder 07/23/2012  . Back pain 04/26/2012  . Cervical disc disease 04/25/2012  . Preventative health care 04/19/2012  . Tachyarrhythmia   . History of thyroid cancer   . Polymyalgia ([place])   . Diverticulitis   . DJD (degenerative joint disease)   . COPD mixed type ([place])   . Hyperlipidemia   . SYNCOPE 12/13/2010  . GERD 08/09/2010  . Other dysphagia 08/09/2010  . Psoriasis 07/16/2010  . INSOMNIA 07/14/2010  . **Note De-Identified [place] Obfuscation** Tobacco user 02/09/2010  . Anxiety 12/30/2009  . Obstructive sleep apnea 12/30/2009  . CARPAL TUNNEL SYNDROME, BILATERAL 12/30/2009  . Essential hypertension 12/30/2009  . BREAST MASS, LEFT 12/30/2009  . Fibromyalgia 12/30/2009  . Osteoporosis 12/30/2009  . LIVER FUNCTION TESTS, ABNORMAL, HX OF 12/30/2009  . Obesity hypoventilation syndrome ([place]) 12/30/2009    Past Surgical History:  Procedure Laterality Date  . APPENDECTOMY    . CARPAL TUNNEL RELEASE  10/30/2011   Procedure: CARPAL  TUNNEL RELEASE;  Surgeon: Wynonia Sours, MD;  Location: [facility];  Service: Orthopedics;  Laterality: Right;  and mass excision  . CHOLECYSTECTOMY    . CHOLESTEATOMA EXCISION     right ear  . left shoulder spurs    . ORIF right lower leg    . partial throidectomy    . TOTAL ABDOMINAL HYSTERECTOMY       OB History   None      Home Medications    Prior to Admission medications   Medication Sig Start Date End Date Taking? Authorizing Provider  albuterol (PROVENTIL HFA;VENTOLIN HFA) 108 (90 Base) MCG/ACT inhaler Inhale 2 puffs into the lungs every 6 (six) hours as needed for wheezing or shortness of breath. 11/30/16  Yes Young, Tarri Fuller D, MD  albuterol (PROVENTIL) (2.5 MG/3ML) 0.083% nebulizer solution Take 3 mLs (2.5 mg total) by nebulization every 4 (four) hours as needed for wheezing or shortness of breath. DX: 496 03/15/17  Yes Baird Lyons D, MD  aspirin EC 81 MG tablet Take 81 mg by mouth daily.   Yes [provider]  atorvastatin (LIPITOR) 80 MG tablet TAKE 1 TABLET EVERY DAY Patient taking differently: Take 80 mg by mouth daily.  03/28/18  Yes Timmothy Euler, MD  budesonide-formoterol [facility]) 160-4.5 MCG/ACT inhaler Inhale 2 puffs then rinse mouth, twice daily Patient taking differently: Inhale 2 puffs into the lungs daily as needed (SOB, wheezing).  04/07/18  Yes Young, Tarri Fuller D, MD  colchicine 0.6 MG tablet Take 0.6 mg by mouth daily.   Yes [provider]  famotidine (PEPCID) 20 MG tablet TAKE  (1)  TABLET TWICE A DAY. Patient taking differently: Take 20 mg by mouth 2 (two) times daily.  04/18/18  Yes Timmothy Euler, MD  fluticasone (FLONASE) 50 MCG/ACT nasal spray Place 2 sprays into both nostrils daily. Patient taking differently: Place 2 sprays into both nostrils daily as needed for allergies.  04/08/17  Yes Timmothy Euler, MD  folic acid (FOLVITE) 1 MG tablet Take 1 mg by mouth daily.    Yes [provider]    lisinopril-hydrochlorothiazide (PRINZIDE,ZESTORETIC) 10-12.5 MG tablet Take 1 tablet by mouth daily.   Yes [provider]  methotrexate (RHEUMATREX) 2.5 MG tablet Take 20 mg by mouth once a week. Monday   Yes [provider]  montelukast (SINGULAIR) 10 MG tablet TAKE 1 TABLET EVERY DAY Patient taking differently: Take 10 mg by mouth daily.  04/18/18  Yes Timmothy Euler, MD  Nebulizers (COMPRESSOR/NEBULIZER) MISC Use up to 4 times daily when needed 02/05/11  Yes Young, Tarri Fuller D, MD  hydrALAZINE (APRESOLINE) 10 MG tablet Take 1 tablet (10 mg total) by mouth 2 (two) times daily as needed. 06/08/18   Varney Biles, MD  doxepin (SINEQUAN) 10 MG capsule 1-2 at bedtime   12/10/17  [provider]  fexofenadine (ALLEGRA) 180 MG tablet Take 180 mg by mouth daily.    12/10/17  [provider]    Family History Family **Note De-Identified Santillanes Obfuscation** History  Problem Relation Age of Onset  . Emphysema Sister   . Hypertension Sister   . Heart attack Sister   . Emphysema Sister   . Alpha-1 antitrypsin deficiency Sister   . Alpha-1 antitrypsin deficiency Sister   . Allergies Sister   . Asthma Sister   . Heart disease Mother   . Cancer Mother   . Hyperlipidemia Mother   . Hypertension Mother   . Heart attack Mother   . Heart disease Father   . Heart attack Father   . Cancer Sister   . Cancer Brother   . Hyperlipidemia Brother   . Hypertension Brother   . Tuberculosis Other        grandmother  . Hyperlipidemia Daughter   . Hypertension Daughter   . Hypertension Son     Social History Social History   Tobacco Use  . Smoking status: Former Smoker    Packs/day: 1.00    Years: 45.00    Pack years: 45.00    Types: Cigarettes    Last attempt to quit: 04/02/2018    Years since quitting: 0.1  . Smokeless tobacco: Never Used  Substance Use Topics  . Alcohol use: No  . Drug use: No     Allergies   Doxycycline; Tramadol; Milnacipran; and Apremilast   Review of Systems Review of  Systems  Constitutional: Positive for activity change.  Eyes: Negative for visual disturbance.  Respiratory: Negative for cough and shortness of breath.   Cardiovascular: Negative for chest pain.  Allergic/Immunologic: Positive for immunocompromised state.  Neurological: Positive for weakness and headaches. Negative for dizziness, tremors, seizures, facial asymmetry, speech difficulty, light-headedness and numbness.  Hematological: Does not bruise/bleed easily.  All other systems reviewed and are negative.    Physical Exam Updated Vital Signs BP (!) 152/77   Pulse (!) 103   Temp 97.9 F (36.6 C) (Oral)   Resp 20   Ht 5\' 5"  (1.651 m)   Wt 113.9 kg   SpO2 97%   BMI 41.77 kg/m   Physical Exam  Constitutional: She is oriented to person, place, and time. She appears well-developed and well-nourished.  HENT:  Head: Normocephalic and atraumatic.  Eyes: Pupils are equal, round, and reactive to light. EOM are normal.  Neck: Neck supple. No JVD present.  Cardiovascular: Normal rate, regular rhythm and normal heart sounds.  No murmur heard. Pulmonary/Chest: Effort normal. No respiratory distress.  Abdominal: Soft. She exhibits no distension. There is no tenderness. There is no rebound and no guarding.  Musculoskeletal: She exhibits no tenderness.  Neurological: She is alert and oriented to person, place, and time. No cranial nerve deficit. Coordination normal.  Skin: Skin is warm and dry.  Nursing note and vitals reviewed.    ED Treatments / Results  Labs (all labs ordered are listed, but only abnormal results are displayed) Labs Reviewed  COMPREHENSIVE METABOLIC PANEL - Abnormal; Notable for the following components:      Result Value   Chloride 96 (*)    Glucose, Bld 162 (*)    Total Protein 8.6 (*)    AST 68 (*)    ALT 50 (*)    Alkaline Phosphatase 128 (*)    All other components within normal limits  CBC - Abnormal; Notable for the following components:   MCV 102.7  (*)    All other components within normal limits  URINALYSIS, ROUTINE W REFLEX MICROSCOPIC - Abnormal; Notable for the following components:   Color, Urine STRAW (*) **Note De-Identified [place] Obfuscation** Protein, ur 100 (*)    All other components within normal limits  D-DIMER, QUANTITATIVE (NOT AT [facility]) - Abnormal; Notable for the following components:   D-Dimer, Quant 0.52 (*)    All other components within normal limits  TROPONIN I - Abnormal; Notable for the following components:   Troponin I 0.03 (*)    All other components within normal limits    EKG EKG Interpretation  Date/Time:  Saturday June 07 2018 22:54:16 EDT Ventricular Rate:  96 PR Interval:    QRS Duration: 123 QT Interval:  386 QTC Calculation: 488 R Axis:   80 Text Interpretation:  Sinus rhythm Consider left atrial enlargement Nonspecific intraventricular conduction delay Minimal ST depression, lateral leads No acute changes Intraventricular conduction delay No significant change since last tracing Confirmed by Varney Biles [phone]) on 06/07/2018 11:47:44 PM Also confirmed by Varney Biles [phone]), editor Philomena Doheny [phone])  on 06/08/2018 9:12:58 AM   Radiology Dg Chest 2 View  Result Date: 06/06/2018 CLINICAL DATA:  Chest pain EXAM: CHEST - 2 VIEW COMPARISON:  05/08/2018, 04/07/2017 FINDINGS: Mild cardiomegaly. No focal opacity or pleural effusion. Aortic atherosclerosis. No pneumothorax. IMPRESSION: No active cardiopulmonary disease.  Mild cardiomegaly Electronically Signed   By: Donavan Foil M.D.   On: 06/06/2018 17:03   Ct Head Wo Contrast  Result Date: 06/06/2018 CLINICAL DATA:  Headache EXAM: CT HEAD WITHOUT CONTRAST TECHNIQUE: Contiguous axial images were obtained from the base of the skull through the vertex without intravenous contrast. COMPARISON:  None. FINDINGS: Brain: No acute territorial infarction, hemorrhage or intracranial mass. Mild small vessel ischemic changes of the white matter. Normal ventricle size. Vascular: No  hyperdense vessels.  Scattered carotid calcification. Skull: Normal. Negative for fracture or focal lesion. Sinuses/Orbits: No acute finding. Other: None IMPRESSION: 1. No CT evidence for acute intracranial abnormality. 2. Mild small vessel ischemic changes of the white matter Electronically Signed   By: Donavan Foil M.D.   On: 06/06/2018 18:37   Ct Head W Contrast  Result Date: 06/08/2018 CLINICAL DATA:  Headache EXAM: CT HEAD WITH CONTRAST TECHNIQUE: Contiguous axial images were obtained from the base of the skull through the vertex with intravenous contrast. CONTRAST:  100 mL Isovue 370 COMPARISON:  Head CT 06/06/2018 FINDINGS: Brain: There is no mass, hemorrhage or extra-axial collection. The size and configuration of the ventricles and extra-axial CSF spaces are normal. There is no acute or chronic infarction. There is hypoattenuation of the periventricular white matter, most commonly indicating chronic ischemic microangiopathy. No abnormal contrast enhancement. Vascular: No abnormal hyperdensity of the major intracranial arteries or dural venous sinuses. No intracranial atherosclerosis. Skull: The visualized skull base, calvarium and extracranial soft tissues are normal. Sinuses/Orbits: No fluid levels or advanced mucosal thickening of the visualized paranasal sinuses. No mastoid or middle ear effusion. The orbits are normal. IMPRESSION: Chronic small vessel ischemia without acute abnormality. No abnormal enhancement. Electronically Signed   By: Ulyses Jarred M.D.   On: 06/08/2018 04:41   Ct Angio Chest Pe W And/or Wo Contrast  Result Date: 06/08/2018 CLINICAL DATA:  Hypertension. Headache and vomiting. Positive D-dimer. EXAM: CT ANGIOGRAPHY CHEST WITH CONTRAST TECHNIQUE: Multidetector CT imaging of the chest was performed using the standard protocol during bolus administration of intravenous contrast. Multiplanar CT image reconstructions and MIPs were obtained to evaluate the vascular anatomy.  CONTRAST:  100 mL Isovue 370 COMPARISON:  High-resolution chest 12/20/2017 FINDINGS: Cardiovascular: Somewhat limited opacification of central pulmonary arteries due to bolus timing. No large central pulmonary emboli **Note De-Identified Batrez Obfuscation** are demonstrated but segmental vessels are not well visualized. Normal caliber thoracic aorta with calcifications. Great vessel origins are patent. Heart size is normal. No pericardial effusions. Coronary artery calcifications. Mediastinum/Nodes: Esophagus is decompressed. Mild prominence of lymph nodes with pretracheal lymph node measuring about 16 mm short axis dimension. No change since prior study suggesting probable reactive change. Lungs/Pleura: Mild atelectasis in the lung bases. No airspace disease or consolidation. No pleural effusions. No pneumothorax. Airways are patent. Upper Abdomen: No acute abnormalities identified. Musculoskeletal: No chest wall abnormality. No acute or significant osseous findings. Review of the MIP images confirms the above findings. IMPRESSION: Technically limited study due to bolus timing. No large central pulmonary emboli are demonstrated but segmental vessels are not well visualized. No evidence of active pulmonary disease. Aortic Atherosclerosis (ICD10-I70.0). Electronically Signed   By: Lucienne Capers M.D.   On: 06/08/2018 04:28    Procedures Procedures (including critical care time)  Medications Ordered in ED Medications  sodium chloride 0.9 % injection (has no administration in time range)  metoCLOPramide (REGLAN) injection 10 mg (10 mg Intravenous Given 06/08/18 0100)  sodium chloride 0.9 % bolus 500 mL (0 mLs Intravenous Stopped 06/08/18 0614)  iopamidol (ISOVUE-370) 76 % injection 100 mL (100 mLs Intravenous Contrast Given 06/08/18 0347)     Initial Impression / Assessment and Plan / ED Course  I have reviewed the triage vital signs and the nursing notes.  Pertinent labs & imaging results that were available during my care of the patient were  reviewed by me and considered in my medical decision making (see chart for details).     62 year old female comes in with chief complaint of elevated blood pressure. She is noted to be hypertensive, but she also complains of weakness and headaches.  Patient is also noted to be tachycardic and she is immunosuppressed because of her methotrexate use.  Review of system is not revealing any source of infection. We will get basic labs and check the urine.  Additionally, with the headache patient has no focal neurologic deficits.  She had a CT scan of the head done yesterday which was negative.  Given that patient'sleading a sedentary lifestyle, she is at risk of having thrombosis.  We will get a d-dimer.  If elevated we will get a CT Angie and a CT venogram head to rule out CVT and PE.  No meningismus, and given that patient is not toxic appearing or having confusion or any focal neurologic deficits-we do not think an LP is required.  Final Clinical Impressions(s) / ED Diagnoses   Final diagnoses:  Hypertensive urgency  Bad headache  Sinus tachycardia    ED Discharge Orders         Ordered    hydrALAZINE (APRESOLINE) 10 MG tablet  2 times daily PRN     06/08/18 2376           Varney Biles, MD 06/08/18 [phone]

## 2018-06-08 NOTE — Discharge Instructions (Signed)
**Note De-Identified [place] Obfuscation** All the results in the ER are normal, labs and imaging. We are not sure what is causing your symptoms. The workup in the ER is not complete, and is limited to screening for life threatening and emergent conditions only, so please see a primary care doctor for further evaluation.  Take the hydralazine only if your blood pressure is over 180 mmhg, and only as prescribed.

## 2018-06-09 ENCOUNTER — Telehealth: Payer: Self-pay | Admitting: Family Medicine

## 2018-06-09 NOTE — Telephone Encounter (Signed)
**Note De-Identified [place] Obfuscation** I have reached out to patient today to check and see how she did over the weekend.  I see that she went to the emergency department twice for elevated blood pressure, headaches.  I reviewed the emergency department notes and imaging studies.  Her blood pressure looks like it has come down quite a bit just in that 24 hours.  She notes that it continues to be stable.  She reports headache is improving that she continues to have a mild headache.  She is using Tylenol as directed.  I offered her earlier appointment but patient declined and wished to come in on the 20th as scheduled.  I encouraged her to contact the office should she need any further assistance or has any other questions prior to that appointment.  Delmar Arriaga M. [name], [facility]

## 2018-06-11 ENCOUNTER — Encounter: Payer: Self-pay | Admitting: Family Medicine

## 2018-06-11 ENCOUNTER — Ambulatory Visit (INDEPENDENT_AMBULATORY_CARE_PROVIDER_SITE_OTHER): Payer: Medicare Other | Admitting: Family Medicine

## 2018-06-11 VITALS — BP 110/68 | HR 105 | Temp 98.4°F | Ht 65.0 in | Wt 239.2 lb

## 2018-06-11 DIAGNOSIS — J9611 Chronic respiratory failure with hypoxia: Secondary | ICD-10-CM | POA: Diagnosis not present

## 2018-06-11 DIAGNOSIS — L400 Psoriasis vulgaris: Secondary | ICD-10-CM | POA: Diagnosis not present

## 2018-06-11 DIAGNOSIS — I10 Essential (primary) hypertension: Secondary | ICD-10-CM

## 2018-06-11 DIAGNOSIS — T85511A Breakdown (mechanical) of esophageal anti-reflux device, initial encounter: Secondary | ICD-10-CM | POA: Diagnosis not present

## 2018-06-11 DIAGNOSIS — G4733 Obstructive sleep apnea (adult) (pediatric): Secondary | ICD-10-CM | POA: Diagnosis not present

## 2018-06-11 DIAGNOSIS — J449 Chronic obstructive pulmonary disease, unspecified: Secondary | ICD-10-CM | POA: Diagnosis not present

## 2018-06-11 MED ORDER — LISINOPRIL 10 MG PO TABS: 10.0000 mg | ORAL_TABLET | Freq: Every day | ORAL | 3 refills | Status: DC

## 2018-06-11 NOTE — Progress Notes (Signed)
**Note De-Identified [place] Obfuscation** Subjective: CC: HTN PCP: [name], DO BSJ:[id number] Hailey Hernandez is a 62 y.o. female presenting to clinic today for:  1. HTN/ ED follow up/ COPD w/ hypoxia Patient was found to be extremely hypertensive with associated shortness of breath and headache last week.  She was sent to the emergency department.  Where she had a CT chest and CT head performed.  These were unremarkable for acute processes.  She was restarted on her lisinopril/HCTZ, which was discontinued mistakenly by the patient because she misunderstood her nephrologist's instructions.  Her husband brings in a log of blood pressures which were taken at random times.  Most recent blood pressures ranged anywhere from systolic blood pressures of 97-167/diastolic blood pressures of 61-112.  Patient denies any chest pain.  Shortness of breath is stable.  No lower extremity edema.  Her husband did feel that she was somewhat disoriented after leaving the hospital but this is gradually improved.  He attributes the disorientation to her mistakenly being on 5 L of oxygen.  He is not sure how long her oxygen has been turned up.  Typically, she uses anywhere from 3 to 4 L.   ROS: Per HPI  Allergies  Allergen Reactions  . Doxycycline Shortness Of Breath and Other (See Comments)    Drug interaction with Soriatane  . Tramadol Shortness Of Breath    Did not work  . Milnacipran     REACTION: dizzy  . Apremilast Rash    Rash, able to take this currently   Past Medical History:  Diagnosis Date  . Allergy   . Anxiety disorder   . Arthritis    bil legs  . Asthma   . Cancer ([place])   . Chronic bronchitis   . COPD (chronic obstructive pulmonary disease) ([place])   . Depression   . Diverticulitis   . DJD (degenerative joint disease)    spine  . Fibromyalgia   . GERD (gastroesophageal reflux disease)   . Glaucoma   . History of thyroid cancer   . HTN (hypertension)    no longer on BP medication  . Hyperlipidemia   . Impaired glucose  tolerance 05/08/2013  . OSA (obstructive sleep apnea)    CPAP  last sleep study 8-10 yr.ag0  . Polymyalgia ([place])   . Psoriasis   . Rhinitis   . Tobacco abuse     Current Outpatient Medications:  .  albuterol (PROVENTIL HFA;VENTOLIN HFA) 108 (90 Base) MCG/ACT inhaler, Inhale 2 puffs into the lungs every 6 (six) hours as needed for wheezing or shortness of breath., Disp: 1 Inhaler, Rfl: 12 .  albuterol (PROVENTIL) (2.5 MG/3ML) 0.083% nebulizer solution, Take 3 mLs (2.5 mg total) by nebulization every 4 (four) hours as needed for wheezing or shortness of breath. DX: 496, Disp: 180 vial, Rfl: 11 .  aspirin EC 81 MG tablet, Take 81 mg by mouth daily., Disp: , Rfl:  .  atorvastatin (LIPITOR) 80 MG tablet, TAKE 1 TABLET EVERY DAY (Patient taking differently: Take 80 mg by mouth daily. ), Disp: 90 tablet, Rfl: 0 .  colchicine 0.6 MG tablet, Take 0.6 mg by mouth daily., Disp: , Rfl:  .  famotidine (PEPCID) 20 MG tablet, TAKE  (1)  TABLET TWICE A DAY. (Patient taking differently: Take 20 mg by mouth 2 (two) times daily. ), Disp: 60 tablet, Rfl: 4 .  fluticasone (FLONASE) 50 MCG/ACT nasal spray, Place 2 sprays into both nostrils daily. (Patient taking differently: Place 2 sprays into both nostrils daily **Note De-Identified [place] Obfuscation** as needed for allergies. ), Disp: 16 g, Rfl: 6 .  folic acid (FOLVITE) 1 MG tablet, Take 1 mg by mouth daily. , Disp: , Rfl:  .  hydrALAZINE (APRESOLINE) 10 MG tablet, Take 1 tablet (10 mg total) by mouth 2 (two) times daily as needed., Disp: 10 tablet, Rfl: 0 .  lisinopril-hydrochlorothiazide (PRINZIDE,ZESTORETIC) 10-12.5 MG tablet, Take 1 tablet by mouth daily., Disp: , Rfl:  .  montelukast (SINGULAIR) 10 MG tablet, TAKE 1 TABLET EVERY DAY (Patient taking differently: Take 10 mg by mouth daily. ), Disp: 90 tablet, Rfl: 0 .  Nebulizers (COMPRESSOR/NEBULIZER) MISC, Use up to 4 times daily when needed, Disp: 1 each, Rfl: 0 .  budesonide-formoterol (SYMBICORT) 160-4.5 MCG/ACT inhaler, Inhale 2 puffs then rinse  mouth, twice daily (Patient taking differently: Inhale 2 puffs into the lungs daily as needed (SOB, wheezing). ), Disp: 1 Inhaler, Rfl: 12 .  methotrexate (RHEUMATREX) 2.5 MG tablet, Take 20 mg by mouth once a week. Monday, Disp: , Rfl:  Social History   Socioeconomic History  . Marital status: Married    Spouse name: Not on file  . Number of children: 3  . Years of education: 9  . Highest education level: Not on file  Occupational History  . Occupation: disabled    Employer: DISABLED  Social Needs  . Financial resource strain: Not on file  . Food insecurity:    Worry: Not on file    Inability: Not on file  . Transportation needs:    Medical: Not on file    Non-medical: Not on file  Tobacco Use  . Smoking status: Former Smoker    Packs/day: 1.00    Years: 45.00    Pack years: 45.00    Types: Cigarettes    Last attempt to quit: 04/02/2018    Years since quitting: 0.1  . Smokeless tobacco: Never Used  Substance and Sexual Activity  . Alcohol use: No  . Drug use: No  . Sexual activity: Not on file  Lifestyle  . Physical activity:    Days per week: Not on file    Minutes per session: Not on file  . Stress: Not on file  Relationships  . Social connections:    Talks on phone: Not on file    Gets together: Not on file    Attends religious service: Not on file    Active member of club or organization: Not on file    Attends meetings of clubs or organizations: Not on file    Relationship status: Not on file  . Intimate partner violence:    Fear of current or ex partner: Not on file    Emotionally abused: Not on file    Physically abused: Not on file    Forced sexual activity: Not on file  Other Topics Concern  . Not on file  Social History Narrative   Married w/ children   Daily caffeine use   Family History  Problem Relation Age of Onset  . Emphysema Sister   . Hypertension Sister   . Heart attack Sister   . Emphysema Sister   . Alpha-1 antitrypsin deficiency  Sister   . Alpha-1 antitrypsin deficiency Sister   . Allergies Sister   . Asthma Sister   . Heart disease Mother   . Cancer Mother   . Hyperlipidemia Mother   . Hypertension Mother   . Heart attack Mother   . Heart disease Father   . Heart attack Father   . **Note De-Identified [place] Obfuscation** Cancer Sister   . Cancer Brother   . Hyperlipidemia Brother   . Hypertension Brother   . Tuberculosis Other        grandmother  . Hyperlipidemia Daughter   . Hypertension Daughter   . Hypertension Son     Objective: Office vital signs reviewed. BP 110/68 Comment: manual  Pulse (!) 105   Temp 98.4 F (36.9 Hailey)   Ht 5' 5" (1.651 m)   Wt 239 lb 3.2 oz (108.5 kg)   SpO2 96%   BMI 39.80 kg/m   Physical Examination:  General: Awake, alert, obese, No acute distress Cardio: regular rate and rhythm, S1S2 heard, no murmurs appreciated Pulm: Globally decreased breath sounds.  Normal work of breathing on 4 L O2 Lang nasal cannula. Extremities: warm, well perfused, No edema, cyanosis or clubbing; +1 pulses bilaterally Skin: Very dry with multiple psoriatic plaques along bilateral lower extremity's in upper extremities.  Assessment/ Plan: 62 y.o. female   1. Essential hypertension Blood pressure under much better control than previous.  I am somewhat concerned that we are overly treating her however.  For this reason, I have dropped the hydrochlorothiazide 12.5 mg from her blood pressure regimen and allowed her to stay on lisinopril 10 mg only. We discussed proper blood pressure monitoring technique and a log was provided to her husband today.  She will follow-up with me in 2 weeks for recheck.   We will recheck renal function given addition back of ACE inhibitor. - CMP14+EGFR  2. Chronic respiratory failure with hypoxia (HCC) Apparently was using 5 L of oxygen at home unknowingly.  Will check carbon dioxide level given history of COPD. - CMP14+EGFR   Orders Placed This Encounter  Procedures  . CMP14+EGFR   Meds ordered  this encounter  Medications  . lisinopril (PRINIVIL,ZESTRIL) 10 MG tablet    Sig: Take 1 tablet (10 mg total) by mouth daily.    Dispense:  90 tablet    Refill:  [place], [place] [phone]

## 2018-06-11 NOTE — Patient Instructions (Signed)
**Note De-Identified [place] Obfuscation** Please call the office tomorrow for your lab results.  If symptoms worsen, please seek immediate medical attention the emergency department.   How to Take Your Blood Pressure You can take your blood pressure at home with a machine. You may need to check your blood pressure at home:  To check if you have high blood pressure (hypertension).  To check your blood pressure over time.  To make sure your blood pressure medicine is working.  Supplies needed: You will need a blood pressure machine, or monitor. You can buy one at a drugstore or online. When choosing one:  Choose one with an arm cuff.  Choose one that wraps around your upper arm. Only one finger should fit between your arm and the cuff.  Do not choose one that measures your blood pressure from your wrist or finger.  Your doctor can suggest a monitor. How to prepare Avoid these things for 30 minutes before checking your blood pressure:  Drinking caffeine.  Drinking alcohol.  Eating.  Smoking.  Exercising.  Five minutes before checking your blood pressure:  Pee.  Sit in a dining chair. Avoid sitting in a soft couch or armchair.  Be quiet. Do not talk.  How to take your blood pressure Follow the instructions that came with your machine. If you have a digital blood pressure monitor, these may be the instructions: 1. Sit up straight. 2. Place your feet on the floor. Do not cross your ankles or legs. 3. Rest your left arm at the level of your heart. You may rest it on a table, desk, or chair. 4. Pull up your shirt sleeve. 5. Wrap the blood pressure cuff around the upper part of your left arm. The cuff should be 1 inch (2.5 cm) above your elbow. It is best to wrap the cuff around bare skin. 6. Fit the cuff snugly around your arm. You should be able to place only one finger between the cuff and your arm. 7. Put the cord inside the groove of your elbow. 8. Press the power button. 9. Sit quietly while the cuff fills  with air and loses air. 10. Write down the numbers on the screen. 11. Wait 2-3 minutes and then repeat steps 1-10.  What do the numbers mean? Two numbers make up your blood pressure. The first number is called systolic pressure. The second is called diastolic pressure. An example of a blood pressure reading is "120 over 80" (or 120/80). If you are an adult and do not have a medical condition, use this guide to find out if your blood pressure is normal: Normal  First number: below 120.  Second number: below 80. Elevated  First number: 120-129.  Second number: below 80. Hypertension stage 1  First number: 130-139.  Second number: 80-89. Hypertension stage 2  First number: 140 or above.  Second number: 49 or above. Your blood pressure is above normal even if only the top or bottom number is above normal. Follow these instructions at home:  Check your blood pressure as often as your doctor tells you to.  Take your monitor to your next doctor's appointment. Your doctor will: ? Make sure you are using it correctly. ? Make sure it is working right.  Make sure you understand what your blood pressure numbers should be.  Tell your doctor if your medicines are causing side effects. Contact a doctor if:  Your blood pressure keeps being high. Get help right away if:  Your first blood pressure number **Note De-Identified Ferrante Obfuscation** is higher than 180.  Your second blood pressure number is higher than 120. This information is not intended to replace advice given to you by your health care provider. Make sure you discuss any questions you have with your health care provider. Document Released: 08/30/2008 Document Revised: 08/15/2016 Document Reviewed: 02/24/2016 Elsevier Interactive Patient Education  [employer].

## 2018-06-12 LAB — CMP14+EGFR
ALBUMIN: 4.5 g/dL (ref 3.6–4.8)
ALK PHOS: 147 IU/L — AB (ref 39–117)
ALT: 62 IU/L — ABNORMAL HIGH (ref 0–32)
AST: 61 IU/L — AB (ref 0–40)
Albumin/Globulin Ratio: 1.1 — ABNORMAL LOW (ref 1.2–2.2)
BILIRUBIN TOTAL: 0.7 mg/dL (ref 0.0–1.2)
BUN/Creatinine Ratio: 29 — ABNORMAL HIGH (ref 12–28)
BUN: 37 mg/dL — AB (ref 8–27)
CHLORIDE: 89 mmol/L — AB (ref 96–106)
CO2: 30 mmol/L — ABNORMAL HIGH (ref 20–29)
Calcium: 9.7 mg/dL (ref 8.7–10.3)
Creatinine, Ser: 1.27 mg/dL — ABNORMAL HIGH (ref 0.57–1.00)
GFR calc Af Amer: 52 mL/min/{1.73_m2} — ABNORMAL LOW (ref >59)
GFR calc non Af Amer: 45 mL/min/{1.73_m2} — ABNORMAL LOW (ref >59)
GLUCOSE: 107 mg/dL — AB (ref 65–99)
Globulin, Total: 4.1 g/dL (ref 1.5–4.5)
Potassium: 3.9 mmol/L (ref 3.5–5.2)
Sodium: 141 mmol/L (ref 134–144)
Total Protein: 8.6 g/dL — ABNORMAL HIGH (ref 6.0–8.5)

## 2018-06-13 ENCOUNTER — Telehealth: Payer: Self-pay | Admitting: Family Medicine

## 2018-06-13 ENCOUNTER — Other Ambulatory Visit: Payer: Self-pay | Admitting: Family Medicine

## 2018-06-13 DIAGNOSIS — R7989 Other specified abnormal findings of blood chemistry: Secondary | ICD-10-CM

## 2018-06-13 NOTE — Telephone Encounter (Signed)
**Note De-Identified Griess Obfuscation** I attempted to contact her on her mobile and on her husband's mobile.  No answer or options for voicemail on either phone numbers.  Please see the results note for details.  I would like her to come back next week for repeat blood work, as her kidney function was slightly impaired.  We did discontinue the hydrochlorothiazide portion of her blood pressure medications that this hopefully will improve.

## 2018-06-13 NOTE — Telephone Encounter (Signed)
**Note De-Identified [place] Obfuscation** Results given by Brynda Peon

## 2018-06-18 ENCOUNTER — Ambulatory Visit (INDEPENDENT_AMBULATORY_CARE_PROVIDER_SITE_OTHER): Payer: Medicare Other | Admitting: Family Medicine

## 2018-06-18 ENCOUNTER — Encounter: Payer: Self-pay | Admitting: Family Medicine

## 2018-06-18 VITALS — BP 121/56 | HR 108 | Temp 97.6°F | Ht 65.0 in | Wt 251.0 lb

## 2018-06-18 DIAGNOSIS — R945 Abnormal results of liver function studies: Secondary | ICD-10-CM

## 2018-06-18 DIAGNOSIS — R410 Disorientation, unspecified: Secondary | ICD-10-CM | POA: Diagnosis not present

## 2018-06-18 DIAGNOSIS — R7989 Other specified abnormal findings of blood chemistry: Secondary | ICD-10-CM | POA: Diagnosis not present

## 2018-06-18 NOTE — Patient Instructions (Signed)
**Note De-Identified [place] Obfuscation** We are checking several labs today.  I will contact you with results as they come.  Likely will be contacted tomorrow.  I would like you to avoid use of the diuretics for now, as I do agree dehydration can promote disorientation.  If you see any evidence of fluid overload, swollen legs, hard breathing is getting worse etc., you may resume use but I would also like to see her back in office.  Continue to make sure that she is well-hydrated.  We discussed that she very well may have mild dementia and that recent illness may have caused it to be much more obvious.  However, we will look for metabolic reasons for her change in mental status as well.  We discussed that her CAT scan of the brain did not demonstrate any acute abnormalities like a stroke.  However, we can consider pursuing an MRI of the brain if her symptoms are not improving by next visit.  We will also plan for referral to neurology at that time if needed.  Contact me if you have any questions or concerns.   Dementia Dementia is the loss of two or more brain functions, such as:  Memory.  Decision making.  Behavior.  Speaking.  Thinking.  Problem solving.  There are many types of dementia. The most common type is called progressive dementia. Progressive dementia gets worse with time and it is irreversible. An example of this type of dementia is Alzheimer disease. What are the causes? This condition may be caused by:  Nerve cell damage in the brain.  Genetic mutations.  Certain medicines.  Multiple small strokes.  An infection, such as chronic meningitis.  A metabolic problem, such as vitamin B12 deficiency or thyroid disease.  Pressure on the brain, such as from a tumor or blood clot.  What are the signs or symptoms? Symptoms of this condition include:  Sudden changes in mood.  Depression.  Problems with balance.  Changes in personality.  Poor short-term  memory.  Agitation.  Delusions.  Hallucinations.  Having a hard time: ? Speaking thoughts. ? Finding words. ? Solving problems. ? Doing familiar tasks. ? Understanding familiar ideas.  How is this diagnosed? This condition is diagnosed with an assessment by your health care provider. During this assessment, your health care provider will talk with you and your family, friends, or caregivers about your symptoms. A thorough medical history will be taken, and you will have a physical exam and tests. Tests may include:  Lab tests, such as blood or urine tests.  Imaging tests, such as a CT scan, PET scan, or MRI.  A lumbar puncture. This test involves removing and testing a small amount of the fluid that surrounds the brain and spinal cord.  An electroencephalogram (EEG). In this test, small metal discs are used to measure electrical activity in the brain.  Memory tests, cognitive tests, and neuropsychological tests. These tests evaluate brain function.  How is this treated? Treatment depends on the cause of the dementia. It may involve taking medicines that may help:  To control the dementia.  To slow down the disease.  To manage symptoms.  In some cases, treating the cause of the dementia can improve symptoms, reverse symptoms, or slow down how quickly the dementia gets worse. Your health care provider can help direct you to support groups, organizations, and other health care providers who can help with decisions about your care. Follow these instructions at home: Medicine  Take over-the-counter and prescription medicines only **Note De-Identified [place] Obfuscation** as told by your health care provider.  Avoid taking medicines that can affect thinking, such as pain or sleeping medicines. Lifestyle   Make healthy lifestyle choices: ? Be physically active as told by your health care provider. ? Do not use any tobacco products, such as cigarettes, chewing tobacco, and e-cigarettes. If you need help quitting,  ask your health care provider. ? Eat a healthy diet. ? Practice stress-management techniques when you get stressed. ? Stay social.  Drink enough fluid to keep your urine clear or pale yellow.  Make sure to get quality sleep. These tips can help you to get a good night's rest: ? Avoid napping during the day. ? Keep your sleeping area dark and cool. ? Avoid exercising during the few hours before you go to bed. ? Avoid caffeine products in the evening. General instructions  Work with your health care provider to determine what you need help with and what your safety needs are.  If you were given a bracelet that tracks your location, make sure to wear it.  Keep all follow-up visits as told by your health care provider. This is important. Contact a health care provider if:  You have any new symptoms.  You have problems with choking or swallowing.  You have any symptoms of a different illness. Get help right away if:  You develop a fever.  You have new or worsening confusion.  You have new or worsening sleepiness.  You have a hard time staying awake.  You or your family members become concerned for your safety. This information is not intended to replace advice given to you by your health care provider. Make sure you discuss any questions you have with your health care provider. Document Released: 03/13/2001 Document Revised: 01/26/2016 Document Reviewed: 06/15/2015 Elsevier Interactive Patient Education  [employer]. Delirium Delirium is a state of mental confusion. It comes on quickly and causes significant changes in a person's thinking and behavior. People with delirium usually have trouble paying attention to what is going on or knowing where they are. They may become very withdrawn or very emotional and unable to sit still. They may even see or feel things that are not there (hallucinations). Delirium is a sign of a serious underlying medical condition. What are the  causes? Delirium occurs when something suddenly affects the signals that the brain sends out. Brain signals can be affected by anything that puts severe stress on the body and brain and causes brain chemicals to be out of balance. The most common causes of delirium include:  Infections. These may be bacterial, viral, fungal, or protozoal.  Medicines. These include many over-the-counter and prescription medicines.  Recreational drugs.  Substance withdrawal. This occurs with sudden discontinuation of alcohol, certain medicines, or recreational drugs.  Surgery.  Sudden vascular events, such as stroke, brain hemorrhage, and severe migraine.  Other brain disorders, such as tumors, seizures, and physical head trauma.  Metabolic disorders, such as kidney or liver failure.  Low blood oxygen (anoxia). This may occur with lung disease, cardiac arrest, or carbon monoxide poisoning.  Hormone imbalances (endocrinopathies), such as an overactive thyroid (hyperthyroidism) or underactive thyroid (hypothyroidism).  Vitamin deficiencies.  What increases the risk? This condition is more likely to develop in:  Children.  Older people.  People who live alone.  People who have vision loss or hearing loss.  People who have existing brain disease, such as dementia.  People who have long-lasting (chronic) medical conditions, such as heart disease. **Note De-Identified Simar Obfuscation** People who are hospitalized for long periods of time.  What are the signs or symptoms? Delirium starts with a sudden change in a person's thinking or behavior. Symptoms come and go (fluctuate) over time, and they are often worse at the end of the day. Symptoms include:  Not being able to stay awake (drowsiness) or pay attention.  Being confused about places, time, and people.  Forgetfulness.  Having extreme energy levels. These may be low or high.  Changes in sleep patterns.  Extreme mood swings, such as anger or anxiety.  Focusing on  things or ideas that are not important.  Rambling and senseless talking.  Difficulty speaking, understanding speech, or both.  Hallucinations.  Tremor or unsteady gait.  How is this diagnosed? People with delirium may not realize that they have the condition. Often, a family member or health care provider is the first person to notice the changes. The health care provider will obtain a detailed history of current symptoms, medical issues, medicines, and recreational drug use. The health care provider will perform a mental status examination by:  Asking questions to check for confusion.  Watching for abnormal behavior.  The health care provider may perform a physical exam and order lab tests or additional studies to determine the cause of the delirium. How is this treated? Treatment of delirium depends on the cause and severity. Delirium usually goes away within days or weeks of treating the underlying cause. In the meantime, the person should not be left alone because he or she may accidentally cause self-harm. Treatment includes supportive care, such as:  Increased light during the day and decreased light at night.  Low noise level.  Uninterrupted sleep.  A regular daily schedule.  Clocks and calendars to help with orientation.  Familiar objects, including the person's pictures and clothing.  Frequent visits from familiar family and friends.  Healthy diet.  Exercise.  In more severe cases of delirium, medicine may be prescribed to help the person to keep calm and think more clearly. Follow these instructions at home:  Any supportive care should be continued as told by the health care provider.  All medicines should be used as told by the health care provider. This is important.  The health care provider should be consulted before over-the-counter medicines, herbs, or supplements are used.  All follow-up visits should be kept as told by the health care provider. This  is important.  Alcohol and recreational drugs should be avoided as told by the health care provider. Contact a health care provider if:  Symptoms do not get better or they become worse.  New symptoms of delirium develop.  Caring for the person at home does not seem safe.  Eating, drinking, or communicating stops.  There are side effects of medicines, such as changes in sleep patterns, dizziness, weight gain, restlessness, movement changes, or tremors. Get help right away if:  Serious thoughts occur about self-harm or about hurting others.  There are serious side effects of medicine, such as: ? Swelling of the face, lips, tongue, or throat. ? Fever, confusion, muscle spasms, or seizures. This information is not intended to replace advice given to you by your health care provider. Make sure you discuss any questions you have with your health care provider. Document Released: 06/11/2012 Document Revised: 02/23/2016 Document Reviewed: 11/10/2014 Elsevier Interactive Patient Education  [employer].

## 2018-06-18 NOTE — Progress Notes (Signed)
**Note De-Identified [place] Obfuscation** Subjective: CC: Memory PCP: [name], DO LZJ:[id number] C Difiore is a 62 y.o. female presenting to clinic today for:  1. Memory Patient is accompanied today by her daughter, who provides quite a bit of the history.  She notes that memory changes and behavioral changes seem to onset after she became acutely ill with pneumonia in August of this year.  She states since that time, she is never really gone back to her baseline.  She reports that currently, patient will attempt to pull off her oxygen and not use her CPAP machine.  She at times seems confused.  Prior to pneumonia, she was completely independent in her IADLs and ADLs.  She continues to be able to self feed self toilet and self bathe.  However, she is no longer able to manage finances independently.  She seemed to be somewhat worse last week, when her daughter noticed that her pills had furosemide in her pillbox.  She states that this was given to her by her nephrologist who wanted her use as a as needed medication.  However, it appeared that she had been using it regularly.  She thinks that the exacerbation of memory issues and delirium were attributed to dehydration.  She states that the cognition seems to have improved somewhat over the last 3 to 4 days since she stopped taking the Lasix.  Denies any evidence of fluid overload.  She is breathing at baseline.  Denies any overt dysuria or hematuria.  No fevers.  However, she would like to have her urine checked today.   ROS: Per HPI  Allergies  Allergen Reactions  . Doxycycline Shortness Of Breath and Other (See Comments)    Drug interaction with Soriatane  . Tramadol Shortness Of Breath    Did not work  . Milnacipran     REACTION: dizzy  . Apremilast Rash    Rash, able to take this currently   Past Medical History:  Diagnosis Date  . Allergy   . Anxiety disorder   . Arthritis    bil legs  . Asthma   . Cancer ([place])   . Chronic bronchitis   . COPD (chronic  obstructive pulmonary disease) ([place])   . Depression   . Diverticulitis   . DJD (degenerative joint disease)    spine  . Fibromyalgia   . GERD (gastroesophageal reflux disease)   . Glaucoma   . History of thyroid cancer   . HTN (hypertension)    no longer on BP medication  . Hyperlipidemia   . Impaired glucose tolerance 05/08/2013  . OSA (obstructive sleep apnea)    CPAP  last sleep study 8-10 yr.ag0  . Polymyalgia ([place])   . Psoriasis   . Rhinitis   . Tobacco abuse     Current Outpatient Medications:  .  albuterol (PROVENTIL HFA;VENTOLIN HFA) 108 (90 Base) MCG/ACT inhaler, Inhale 2 puffs into the lungs every 6 (six) hours as needed for wheezing or shortness of breath., Disp: 1 Inhaler, Rfl: 12 .  albuterol (PROVENTIL) (2.5 MG/3ML) 0.083% nebulizer solution, Take 3 mLs (2.5 mg total) by nebulization every 4 (four) hours as needed for wheezing or shortness of breath. DX: 496, Disp: 180 vial, Rfl: 11 .  aspirin EC 81 MG tablet, Take 81 mg by mouth daily., Disp: , Rfl:  .  atorvastatin (LIPITOR) 80 MG tablet, TAKE 1 TABLET EVERY DAY (Patient taking differently: Take 80 mg by mouth daily. ), Disp: 90 tablet, Rfl: 0 .  budesonide-formoterol (SYMBICORT) 160-4.5 **Note De-Identified [place] Obfuscation** MCG/ACT inhaler, Inhale 2 puffs then rinse mouth, twice daily (Patient taking differently: Inhale 2 puffs into the lungs daily as needed (SOB, wheezing). ), Disp: 1 Inhaler, Rfl: 12 .  colchicine 0.6 MG tablet, Take 0.6 mg by mouth daily., Disp: , Rfl:  .  famotidine (PEPCID) 20 MG tablet, TAKE  (1)  TABLET TWICE A DAY. (Patient taking differently: Take 20 mg by mouth 2 (two) times daily. ), Disp: 60 tablet, Rfl: 4 .  fluticasone (FLONASE) 50 MCG/ACT nasal spray, Place 2 sprays into both nostrils daily. (Patient taking differently: Place 2 sprays into both nostrils daily as needed for allergies. ), Disp: 16 g, Rfl: 6 .  folic acid (FOLVITE) 1 MG tablet, Take 1 mg by mouth daily. , Disp: , Rfl:  .  hydrALAZINE (APRESOLINE) 10 MG tablet,  Take 1 tablet (10 mg total) by mouth 2 (two) times daily as needed., Disp: 10 tablet, Rfl: 0 .  lisinopril (PRINIVIL,ZESTRIL) 10 MG tablet, Take 1 tablet (10 mg total) by mouth daily., Disp: 90 tablet, Rfl: 3 .  methotrexate (RHEUMATREX) 2.5 MG tablet, Take 20 mg by mouth once a week. Monday, Disp: , Rfl:  .  montelukast (SINGULAIR) 10 MG tablet, TAKE 1 TABLET EVERY DAY (Patient taking differently: Take 10 mg by mouth daily. ), Disp: 90 tablet, Rfl: 0 .  Nebulizers (COMPRESSOR/NEBULIZER) MISC, Use up to 4 times daily when needed, Disp: 1 each, Rfl: 0 Social History   Socioeconomic History  . Marital status: Married    Spouse name: Not on file  . Number of children: 3  . Years of education: 9  . Highest education level: Not on file  Occupational History  . Occupation: disabled    Employer: DISABLED  Social Needs  . Financial resource strain: Not on file  . Food insecurity:    Worry: Not on file    Inability: Not on file  . Transportation needs:    Medical: Not on file    Non-medical: Not on file  Tobacco Use  . Smoking status: Former Smoker    Packs/day: 1.00    Years: 45.00    Pack years: 45.00    Types: Cigarettes    Last attempt to quit: 04/02/2018    Years since quitting: 0.2  . Smokeless tobacco: Never Used  Substance and Sexual Activity  . Alcohol use: No  . Drug use: No  . Sexual activity: Not on file  Lifestyle  . Physical activity:    Days per week: Not on file    Minutes per session: Not on file  . Stress: Not on file  Relationships  . Social connections:    Talks on phone: Not on file    Gets together: Not on file    Attends religious service: Not on file    Active member of club or organization: Not on file    Attends meetings of clubs or organizations: Not on file    Relationship status: Not on file  . Intimate partner violence:    Fear of current or ex partner: Not on file    Emotionally abused: Not on file    Physically abused: Not on file    Forced  sexual activity: Not on file  Other Topics Concern  . Not on file  Social History Narrative   Married w/ children   Daily caffeine use   Family History  Problem Relation Age of Onset  . Emphysema Sister   . Hypertension Sister   . Heart **Note De-Identified [place] Obfuscation** attack Sister   . Emphysema Sister   . Alpha-1 antitrypsin deficiency Sister   . Alpha-1 antitrypsin deficiency Sister   . Allergies Sister   . Asthma Sister   . Heart disease Mother   . Cancer Mother   . Hyperlipidemia Mother   . Hypertension Mother   . Heart attack Mother   . Heart disease Father   . Heart attack Father   . Cancer Sister   . Cancer Brother   . Hyperlipidemia Brother   . Hypertension Brother   . Tuberculosis Other        grandmother  . Hyperlipidemia Daughter   . Hypertension Daughter   . Hypertension Son     Objective: Office vital signs reviewed. BP (!) 121/56   Pulse (!) 108   Temp 97.6 F (36.4 C) (Oral)   Ht '5\' 5"'  (1.651 m)   Wt 251 lb (113.9 kg)   SpO2 96% Comment: 3L O2 [place]  BMI 41.77 kg/m   Physical Examination:  General: Awake, alert, chronically ill appearing. No acute distress HEENT: Normal    Neck: No masses palpated. No lymphadenopathy; no JVD    Ears: Tympanic membranes intact, normal light reflex, no erythema, no bulging    Eyes: PERRLA, extraocular membranes intact, sclera white    Throat: moist mucus membranes; symmetric rise of palate Cardio: regular rate and rhythm, S1S2 heard, no murmurs appreciated Pulm: Globally decreased breath sounds with no wheezes, rhonchi or rales; normal work of breathing on 3 L of home oxygen Extremities: warm, well perfused, No edema, cyanosis or clubbing; +2 pulses bilaterally Skin: dry; intact; no rashes or lesions Neuro: 5/5 UE and LE Strength and light touch sensation grossly intact, cranial nerves II through XII grossly intact.  She is alert and oriented to self and place.  She cannot identify the year or president but can identify that we are going in the  fall.  MMSE - Mini Mental State Exam 06/18/2018  Orientation to time 1  Orientation to Place 3  Registration 3  Attention/ Calculation 3  Recall 3  Language- name 2 objects 2  Language- repeat 1  Language- follow 3 step command 3  Language- read & follow direction 1  Write a sentence 0  Copy design 1  Total score 21    Assessment/ Plan: 62 y.o. female   1. Disorientation Difficult to tell if this is delirium versus progression of an undiagnosed dementia.  She certainly is considered "mild dementia" with a MMSE score of 21 today.  I discussed with the daughter at length how acute illnesses can impact the dementia and because what may have been a very mild dementia to progress abruptly.  However, I do think it imperative to rule out metabolic etiology and infectious etiology.  I reviewed her CT chest and CT head from recent hospitalization on 9/6.  CT head did not demonstrate any acute abnormalities.  CT chest demonstrated no evidence of PE.  Her recent lab work did demonstrate an elevation in her creatinine as well as a decline in her chloride.  I suspect that this is related to overdiuresis, as she was taking both Lasix and hydrochlorothiazide which was recently added back to her regimen.  Since that collection of blood, her hydrochlorothiazide has been discontinued as has her Lasix by her daughter.  Will check CMP today to further evaluate.  If persistently abnormal, we will plan to discontinue the lisinopril completely and place her on Norvasc for blood pressure control.  Additionally, **Note De-Identified [place] Obfuscation** she had a slight rise in her LFTs.  We discussed consideration for MRI of the brain as well as referral to neurology if symptoms did not improve as she became better hydrated.  Her daughter was very amenable to this.  We will plan to recheck in about 1 to 2 weeks to see how she is progressing.  I discussed reasons for emergent evaluation the emergency department.  The daughter voiced good understanding. -  Vitamin B12 - CMP14+EGFR - CBC with Differential - TSH - RPR - HIV Antibody (routine testing w rflx) - Urinalysis, Complete  2. Elevated serum creatinine - CMP14+EGFR  3. Elevated LFTs - CMP14+EGFR   Orders Placed This Encounter  Procedures  . Vitamin B12  . CMP14+EGFR  . CBC with Differential  . TSH  . RPR  . HIV Antibody (routine testing w rflx)  . Urinalysis, Complete   No orders of the defined types were placed in this encounter.    [name], DO [place] [phone]

## 2018-06-19 ENCOUNTER — Other Ambulatory Visit: Payer: Self-pay | Admitting: Family Medicine

## 2018-06-19 ENCOUNTER — Other Ambulatory Visit: Payer: Medicare Other

## 2018-06-19 DIAGNOSIS — R410 Disorientation, unspecified: Secondary | ICD-10-CM

## 2018-06-19 LAB — CMP14+EGFR
A/G RATIO: 1.3 (ref 1.2–2.2)
ALT: 35 IU/L — AB (ref 0–32)
AST: 26 IU/L (ref 0–40)
Albumin: 4.1 g/dL (ref 3.6–4.8)
Alkaline Phosphatase: 134 IU/L — ABNORMAL HIGH (ref 39–117)
BUN / CREAT RATIO: 28 (ref 12–28)
BUN: 29 mg/dL — ABNORMAL HIGH (ref 8–27)
Bilirubin Total: 0.4 mg/dL (ref 0.0–1.2)
CALCIUM: 9.3 mg/dL (ref 8.7–10.3)
CO2: 28 mmol/L (ref 20–29)
Chloride: 100 mmol/L (ref 96–106)
Creatinine, Ser: 1.03 mg/dL — ABNORMAL HIGH (ref 0.57–1.00)
GFR calc Af Amer: 66 mL/min/{1.73_m2} (ref >59)
GFR calc non Af Amer: 58 mL/min/{1.73_m2} — ABNORMAL LOW (ref >59)
GLOBULIN, TOTAL: 3.2 g/dL (ref 1.5–4.5)
Glucose: 179 mg/dL — ABNORMAL HIGH (ref 65–99)
POTASSIUM: 4.8 mmol/L (ref 3.5–5.2)
SODIUM: 144 mmol/L (ref 134–144)
TOTAL PROTEIN: 7.3 g/dL (ref 6.0–8.5)

## 2018-06-19 LAB — CBC WITH DIFFERENTIAL/PLATELET
Basophils Absolute: 0 10*3/uL (ref 0.0–0.2)
Basos: 0 %
EOS (ABSOLUTE): 0.3 10*3/uL (ref 0.0–0.4)
Eos: 4 %
Hematocrit: 39.1 % (ref 34.0–46.6)
Hemoglobin: 13 g/dL (ref 11.1–15.9)
IMMATURE GRANULOCYTES: 0 %
Immature Grans (Abs): 0 10*3/uL (ref 0.0–0.1)
LYMPHS ABS: 1.6 10*3/uL (ref 0.7–3.1)
Lymphs: 21 %
MCH: 31.8 pg (ref 26.6–33.0)
MCHC: 33.2 g/dL (ref 31.5–35.7)
MCV: 96 fL (ref 79–97)
MONOS ABS: 0.4 10*3/uL (ref 0.1–0.9)
Monocytes: 6 %
NEUTROS PCT: 69 %
Neutrophils Absolute: 5.1 10*3/uL (ref 1.4–7.0)
PLATELETS: 175 10*3/uL (ref 150–450)
RBC: 4.09 x10E6/uL (ref 3.77–5.28)
RDW: 15.8 % — AB (ref 12.3–15.4)
WBC: 7.4 10*3/uL (ref 3.4–10.8)

## 2018-06-19 LAB — URINALYSIS, COMPLETE
Bilirubin, UA: NEGATIVE
GLUCOSE, UA: NEGATIVE
Ketones, UA: NEGATIVE
LEUKOCYTES UA: NEGATIVE
Nitrite, UA: NEGATIVE
SPEC GRAV UA: 1.015 (ref 1.005–1.030)
Urobilinogen, Ur: 0.2 mg/dL (ref 0.2–1.0)
pH, UA: 6.5 (ref 5.0–7.5)

## 2018-06-19 LAB — MICROSCOPIC EXAMINATION
BACTERIA UA: NONE SEEN
Renal Epithel, UA: NONE SEEN /hpf

## 2018-06-19 LAB — VITAMIN B12: VITAMIN B 12: 417 pg/mL (ref 232–1245)

## 2018-06-19 LAB — TSH: TSH: 1.59 u[IU]/mL (ref 0.450–4.500)

## 2018-06-19 LAB — HIV ANTIBODY (ROUTINE TESTING W REFLEX): HIV SCREEN 4TH GENERATION: NONREACTIVE

## 2018-06-19 LAB — RPR: RPR: NONREACTIVE

## 2018-06-20 ENCOUNTER — Ambulatory Visit: Payer: Medicare Other | Admitting: Family Medicine

## 2018-06-20 LAB — URINE CULTURE: Organism ID, Bacteria: NO GROWTH

## 2018-06-25 ENCOUNTER — Ambulatory Visit: Payer: Medicare Other | Admitting: Family Medicine

## 2018-06-27 DIAGNOSIS — G4733 Obstructive sleep apnea (adult) (pediatric): Secondary | ICD-10-CM | POA: Diagnosis not present

## 2018-06-27 DIAGNOSIS — G4730 Sleep apnea, unspecified: Secondary | ICD-10-CM | POA: Diagnosis not present

## 2018-07-02 ENCOUNTER — Ambulatory Visit (INDEPENDENT_AMBULATORY_CARE_PROVIDER_SITE_OTHER): Payer: Medicare Other | Admitting: Family Medicine

## 2018-07-02 ENCOUNTER — Encounter: Payer: Self-pay | Admitting: Family Medicine

## 2018-07-02 VITALS — BP 113/71 | HR 128 | Temp 98.1°F | Ht 65.0 in | Wt 250.0 lb

## 2018-07-02 DIAGNOSIS — Z23 Encounter for immunization: Secondary | ICD-10-CM

## 2018-07-02 DIAGNOSIS — R410 Disorientation, unspecified: Secondary | ICD-10-CM

## 2018-07-02 DIAGNOSIS — F329 Major depressive disorder, single episode, unspecified: Secondary | ICD-10-CM | POA: Diagnosis not present

## 2018-07-02 DIAGNOSIS — F32A Depression, unspecified: Secondary | ICD-10-CM

## 2018-07-02 DIAGNOSIS — R000 Tachycardia, unspecified: Secondary | ICD-10-CM

## 2018-07-02 MED ORDER — DULOXETINE HCL 30 MG PO CPEP: 30.0000 mg | ORAL_CAPSULE | Freq: Every day | ORAL | 1 refills | Status: DC

## 2018-07-02 NOTE — Progress Notes (Signed)
**Note De-Identified [place] Obfuscation** Subjective: CC: Altered mentation f/u PCP: [name], DO HMC:[id number] C Dax is a 62 y.o. female presenting to clinic today for:  1. Altered mentation Patient was seen 2 weeks ago for concerns for memory and altered mentation.  At that time, her daughter had provided much of the history and stated that symptoms onset abruptly after she was acutely ill with pneumonia over the summer.  She now has impairment in some of her IADLs and ADLs.  She continues to be able to self feed self toilet and self bathe.  However, she is no longer able to manage finances independently.  There was concern that dehydration may have been playing a part as symptoms seemed to improve after discontinuing Lasix which was recommended as a PRN medication but being given as a daily medicine.  MMSE score was 21 at last visit, concerning for mild dementia.  She had labs performed to further evaluate change in mentation.  Thyroid function was normal.  RPR and HIV tests were negative.  Kidney function was improving but still slightly impaired.  Liver function tests were greatly improving.  ALT was still slightly elevated at 35 but down from 62.  There is no evidence of gross infection or inflammatory process.  No evidence of anemia.  Vitamin B12 was normal.  She notes that her mental status and mentation have improved greatly since discontinuation of the Lasix and pushing oral fluids.  Her granddaughter is here with her and also notes this since last visit.     2. Depression: She does report some concerns for depression.  She cites that she is very overwhelmed with the multiple medical problems that are ongoing with her family and herself.  She is wanting to go back on Cymbalta, which she previously used for major depressive disorder.  Personal history is significant for hospitalization for major depressive disorder 3 times in the past at [facility] in [place].  Last hospitalization was greater than 10 years  ago.  No history of suicide attempts.  She does report having history of visual and auditory hallucinations.  She has not had any of those since last hospitalization.  No alcohol use or drug use.  Family history is significant for depressive disorder and her sisters and granddaughter.  There is a history of bipolar disorder and her daughter.  There is also a history of alcohol use disorder in her brothers and drug use and her son.  ROS: Per HPI  Allergies  Allergen Reactions  . Doxycycline Shortness Of Breath and Other (See Comments)    Drug interaction with Soriatane  . Tramadol Shortness Of Breath    Did not work  . Milnacipran     REACTION: dizzy  . Apremilast Rash    Rash, able to take this currently   Past Medical History:  Diagnosis Date  . Allergy   . Anxiety disorder   . Arthritis    bil legs  . Asthma   . Cancer ([place])   . Chronic bronchitis   . COPD (chronic obstructive pulmonary disease) ([place])   . Depression   . Diverticulitis   . DJD (degenerative joint disease)    spine  . Fibromyalgia   . GERD (gastroesophageal reflux disease)   . Glaucoma   . History of thyroid cancer   . HTN (hypertension)    no longer on BP medication  . Hyperlipidemia   . Impaired glucose tolerance 05/08/2013  . OSA (obstructive sleep apnea)    CPAP **Note De-Identified [place] Obfuscation** last sleep study 8-10 yr.ag0  . Polymyalgia ([place])   . Psoriasis   . Rhinitis   . Tobacco abuse     Current Outpatient Medications:  .  albuterol (PROVENTIL HFA;VENTOLIN HFA) 108 (90 Base) MCG/ACT inhaler, Inhale 2 puffs into the lungs every 6 (six) hours as needed for wheezing or shortness of breath., Disp: 1 Inhaler, Rfl: 12 .  albuterol (PROVENTIL) (2.5 MG/3ML) 0.083% nebulizer solution, Take 3 mLs (2.5 mg total) by nebulization every 4 (four) hours as needed for wheezing or shortness of breath. DX: 496, Disp: 180 vial, Rfl: 11 .  aspirin EC 81 MG tablet, Take 81 mg by mouth daily., Disp: , Rfl:  .  atorvastatin (LIPITOR) 80 MG tablet,  TAKE 1 TABLET EVERY DAY (Patient taking differently: Take 80 mg by mouth daily. ), Disp: 90 tablet, Rfl: 0 .  budesonide-formoterol (SYMBICORT) 160-4.5 MCG/ACT inhaler, Inhale 2 puffs then rinse mouth, twice daily (Patient taking differently: Inhale 2 puffs into the lungs daily as needed (SOB, wheezing). ), Disp: 1 Inhaler, Rfl: 12 .  colchicine 0.6 MG tablet, Take 0.6 mg by mouth daily., Disp: , Rfl:  .  famotidine (PEPCID) 20 MG tablet, TAKE  (1)  TABLET TWICE A DAY. (Patient taking differently: Take 20 mg by mouth 2 (two) times daily. ), Disp: 60 tablet, Rfl: 4 .  fluticasone (FLONASE) 50 MCG/ACT nasal spray, Place 2 sprays into both nostrils daily. (Patient taking differently: Place 2 sprays into both nostrils daily as needed for allergies. ), Disp: 16 g, Rfl: 6 .  folic acid (FOLVITE) 1 MG tablet, Take 1 mg by mouth daily. , Disp: , Rfl:  .  hydrALAZINE (APRESOLINE) 10 MG tablet, Take 1 tablet (10 mg total) by mouth 2 (two) times daily as needed., Disp: 10 tablet, Rfl: 0 .  lisinopril (PRINIVIL,ZESTRIL) 10 MG tablet, Take 1 tablet (10 mg total) by mouth daily., Disp: 90 tablet, Rfl: 3 .  methotrexate (RHEUMATREX) 2.5 MG tablet, Take 20 mg by mouth once a week. Monday, Disp: , Rfl:  .  montelukast (SINGULAIR) 10 MG tablet, TAKE 1 TABLET EVERY DAY (Patient taking differently: Take 10 mg by mouth daily. ), Disp: 90 tablet, Rfl: 0 .  Nebulizers (COMPRESSOR/NEBULIZER) MISC, Use up to 4 times daily when needed, Disp: 1 each, Rfl: 0 Social History   Socioeconomic History  . Marital status: Married    Spouse name: Not on file  . Number of children: 3  . Years of education: 9  . Highest education level: Not on file  Occupational History  . Occupation: disabled    Employer: DISABLED  Social Needs  . Financial resource strain: Not on file  . Food insecurity:    Worry: Not on file    Inability: Not on file  . Transportation needs:    Medical: Not on file    Non-medical: Not on file  Tobacco  Use  . Smoking status: Former Smoker    Packs/day: 1.00    Years: 45.00    Pack years: 45.00    Types: Cigarettes    Last attempt to quit: 04/02/2018    Years since quitting: 0.2  . Smokeless tobacco: Never Used  Substance and Sexual Activity  . Alcohol use: No  . Drug use: No  . Sexual activity: Not on file  Lifestyle  . Physical activity:    Days per week: Not on file    Minutes per session: Not on file  . Stress: Not on file  Relationships  . **Note De-Identified [place] Obfuscation** Social connections:    Talks on phone: Not on file    Gets together: Not on file    Attends religious service: Not on file    Active member of club or organization: Not on file    Attends meetings of clubs or organizations: Not on file    Relationship status: Not on file  . Intimate partner violence:    Fear of current or ex partner: Not on file    Emotionally abused: Not on file    Physically abused: Not on file    Forced sexual activity: Not on file  Other Topics Concern  . Not on file  Social History Narrative   Married w/ children   Daily caffeine use   Family History  Problem Relation Age of Onset  . Emphysema Sister   . Hypertension Sister   . Heart attack Sister   . Emphysema Sister   . Alpha-1 antitrypsin deficiency Sister   . Alpha-1 antitrypsin deficiency Sister   . Allergies Sister   . Asthma Sister   . Heart disease Mother   . Cancer Mother   . Hyperlipidemia Mother   . Hypertension Mother   . Heart attack Mother   . Heart disease Father   . Heart attack Father   . Cancer Sister   . Cancer Brother   . Hyperlipidemia Brother   . Hypertension Brother   . Tuberculosis Other        grandmother  . Hyperlipidemia Daughter   . Hypertension Daughter   . Hypertension Son     Objective: Office vital signs reviewed. BP 113/71   Pulse (!) 128   Temp 98.1 F (36.7 C) (Oral)   Ht 5\' 5"  (1.651 m)   Wt 250 lb (113.4 kg)   SpO2 96%   BMI 41.60 kg/m   Physical Examination:  General: Awake, alert,  chronically ill appearing. No acute distress HEENT: Normal    Eyes: PERRLA, extraocular membranes intact, sclera white    Throat: moist mucus membranes; symmetric rise of palate Cardio: sinus tachycardia, S1S2 heard, no murmurs appreciated Pulm: Globally decreased breath sounds with no wheezes, rhonchi or rales; normal work of breathing on 3 L of home oxygen Skin: dry; intact; no rashes or lesions Neuro: Cranial nerves II through XII grossly intact.  She is alert and oriented x3. Able to identify county, country, state.  Recall 2 words.  Can spell WORLD backwards.  Psych: Mood depressed.  Affect appropriate.  Good eye contact.  Thought process normal.  Does not appear to be responding to internal stimuli. Depression screen [facility] 2/9 07/02/2018 06/18/2018 06/11/2018  Decreased Interest 0 0 1  Down, Depressed, Hopeless 3 0 1  PHQ - 2 Score 3 0 2  Altered sleeping 1 - -  Tired, decreased energy 3 - -  Change in appetite 0 - -  Feeling bad or failure about yourself  0 - -  Trouble concentrating 0 - -  Moving slowly or fidgety/restless 0 - -  Suicidal thoughts 0 - -  PHQ-9 Score 7 - -  Difficult doing work/chores Not difficult at all - -  Some recent data might be hidden   Assessment/ Plan: 62 y.o. female   1. Depressive disorder PHQ 9 score of 7 today.  She has a substantial history of major depressive disorder.  She reports having good tolerance to Cymbalta.  I have started this at 30 mg daily for her.  She will follow-up with me in 4 to 6 **Note De-Identified [place] Obfuscation** weeks or sooner if needed.  Handout provided today.   2. Disorientation Mental status greatly improved from last visit.  I do think that disorientation may have been related to dehydration.  We reviewed her laboratory results in office today and a hard copy was provided to her.  She will follow-up in 4 weeks for depression.  If she develops any waxing or waning symptoms or has any additional concerns, she will follow-up with me regarding this matter.  3.  Sinus tachycardia Seems to be a long-standing issue for patient.  She was previously followed by Dr. Stanford Breed and was supposed to follow-up 6 months after last visit in April 2019.  I see no follow-up and therefore placed a referral back to their office.  Sinus tachycardia was thought to be secondary to COPD and breathing issues at that time. - Ambulatory referral to Cardiology  4.  Need for immunization against influenza - Flu Vaccine QUAD 36+ mos IM   Orders Placed This Encounter  Procedures  . Flu Vaccine QUAD 36+ mos IM  . Ambulatory referral to Cardiology    Referral Priority:   Routine    Referral Type:   Consultation    Referral Reason:   Specialty Services Required    Requested Specialty:   Cardiology    Number of Visits Requested:   1   Meds ordered this encounter  Medications  . DULoxetine (CYMBALTA) 30 MG capsule    Sig: Take 1 capsule (30 mg total) by mouth daily.    Dispense:  30 capsule    Refill:  [place], [place] [phone]

## 2018-07-02 NOTE — Patient Instructions (Signed)
**Note De-Identified [place] Obfuscation** I am so glad to see that you are feeling better.  The cardiology office will contact you for an appointment.   Taking the medicine as directed and not missing any doses is one of the best things you can do to treat your depression.  Here are some things to keep in mind:  1) Side effects (stomach upset, some increased anxiety) may happen before you notice a benefit.  These side effects typically go away over time. 2) Changes to your dose of medicine or a change in medication all together is sometimes necessary 3) Most people need to be on medication at least 12 months 4) Many people will notice an improvement within two weeks but the full effect of the medication can take up to 4-6 weeks 5) Stopping the medication when you start feeling better often results in a return of symptoms 6) Never discontinue your medication without contacting a health care professional first.  Some medications require gradual discontinuation/ taper and can make you sick if you stop them abruptly.  If your symptoms worsen or you have thoughts of suicide/homicide, PLEASE SEEK IMMEDIATE MEDICAL ATTENTION.  You may always call:  National Suicide Hotline: (480)410-6282 [place]: (828) 460-8700 Crisis Recovery in [place]: (351) 821-6416   These are available 24 hours a day, 7 days a week.

## 2018-07-03 DIAGNOSIS — G4733 Obstructive sleep apnea (adult) (pediatric): Secondary | ICD-10-CM | POA: Diagnosis not present

## 2018-07-09 ENCOUNTER — Other Ambulatory Visit: Payer: Self-pay | Admitting: Family Medicine

## 2018-07-09 ENCOUNTER — Encounter: Payer: Self-pay | Admitting: Family Medicine

## 2018-07-09 MED ORDER — DULOXETINE HCL 20 MG PO CPEP: 20.0000 mg | ORAL_CAPSULE | Freq: Every day | ORAL | 1 refills | Status: DC

## 2018-07-11 DIAGNOSIS — J449 Chronic obstructive pulmonary disease, unspecified: Secondary | ICD-10-CM | POA: Diagnosis not present

## 2018-07-11 DIAGNOSIS — L400 Psoriasis vulgaris: Secondary | ICD-10-CM | POA: Diagnosis not present

## 2018-07-11 DIAGNOSIS — T85511A Breakdown (mechanical) of esophageal anti-reflux device, initial encounter: Secondary | ICD-10-CM | POA: Diagnosis not present

## 2018-07-11 DIAGNOSIS — G4733 Obstructive sleep apnea (adult) (pediatric): Secondary | ICD-10-CM | POA: Diagnosis not present

## 2018-07-17 ENCOUNTER — Other Ambulatory Visit: Payer: Self-pay | Admitting: *Deleted

## 2018-07-17 MED ORDER — ATORVASTATIN CALCIUM 80 MG PO TABS: 80.0000 mg | ORAL_TABLET | Freq: Every day | ORAL | 0 refills | Status: DC

## 2018-07-29 ENCOUNTER — Other Ambulatory Visit: Payer: Self-pay

## 2018-07-29 MED ORDER — MONTELUKAST SODIUM 10 MG PO TABS: 10.0000 mg | ORAL_TABLET | Freq: Every day | ORAL | 0 refills | Status: DC

## 2018-07-30 NOTE — Progress Notes (Deleted)
**Note De-Identified Jacobowitz Obfuscation** HPI: FU tachycardia.   Echocardiogram April 2018 showed normal LV function, hypokinesis of the basilar inferior wall, mild diastolic dysfunction and mild left atrial enlargement.  Patient has chronic home O2 dependent COPD.  Chest CT March 2019 showed scattered pulmonary nodules and follow-up recommended 12 months.  There was three-vessel coronary artery calcification and enlarged pulmonary arteries suggestive of pulmonary hypertension.  Since she was last seen  Current Outpatient Medications  Medication Sig Dispense Refill  . albuterol (PROVENTIL HFA;VENTOLIN HFA) 108 (90 Base) MCG/ACT inhaler Inhale 2 puffs into the lungs every 6 (six) hours as needed for wheezing or shortness of breath. 1 Inhaler 12  . albuterol (PROVENTIL) (2.5 MG/3ML) 0.083% nebulizer solution Take 3 mLs (2.5 mg total) by nebulization every 4 (four) hours as needed for wheezing or shortness of breath. DX: 496 180 vial 11  . aspirin EC 81 MG tablet Take 81 mg by mouth daily.    Marland Kitchen atorvastatin (LIPITOR) 80 MG tablet Take 1 tablet (80 mg total) by mouth daily. 90 tablet 0  . budesonide-formoterol (SYMBICORT) 160-4.5 MCG/ACT inhaler Inhale 2 puffs then rinse mouth, twice daily (Patient taking differently: Inhale 2 puffs into the lungs daily as needed (SOB, wheezing). ) 1 Inhaler 12  . colchicine 0.6 MG tablet Take 0.6 mg by mouth daily.    . DULoxetine (CYMBALTA) 20 MG capsule Take 1 capsule (20 mg total) by mouth daily. 30 capsule 1  . famotidine (PEPCID) 20 MG tablet TAKE  (1)  TABLET TWICE A DAY. (Patient taking differently: Take 20 mg by mouth 2 (two) times daily. ) 60 tablet 4  . fluticasone (FLONASE) 50 MCG/ACT nasal spray Place 2 sprays into both nostrils daily. (Patient taking differently: Place 2 sprays into both nostrils daily as needed for allergies. ) 16 g 6  . folic acid (FOLVITE) 1 MG tablet Take 1 mg by mouth daily.     . hydrALAZINE (APRESOLINE) 10 MG tablet Take 1 tablet (10 mg total) by mouth 2 (two) times  daily as needed. 10 tablet 0  . lisinopril (PRINIVIL,ZESTRIL) 10 MG tablet Take 1 tablet (10 mg total) by mouth daily. 90 tablet 3  . methotrexate (RHEUMATREX) 2.5 MG tablet Take 20 mg by mouth once a week. Monday    . montelukast (SINGULAIR) 10 MG tablet Take 1 tablet (10 mg total) by mouth daily. 90 tablet 0  . Nebulizers (COMPRESSOR/NEBULIZER) MISC Use up to 4 times daily when needed 1 each 0   No current facility-administered medications for this visit.      Past Medical History:  Diagnosis Date  . Allergy   . Anxiety disorder   . Arthritis    bil legs  . Asthma   . Cancer ([place])   . Chronic bronchitis   . COPD (chronic obstructive pulmonary disease) ([place])   . Depression   . Diverticulitis   . DJD (degenerative joint disease)    spine  . Fibromyalgia   . GERD (gastroesophageal reflux disease)   . Glaucoma   . History of thyroid cancer   . HTN (hypertension)    no longer on BP medication  . Hyperlipidemia   . Impaired glucose tolerance 05/08/2013  . OSA (obstructive sleep apnea)    CPAP  last sleep study 8-10 yr.ag0  . Polymyalgia ([place])   . Psoriasis   . Rhinitis   . Tobacco abuse     Past Surgical History:  Procedure Laterality Date  . APPENDECTOMY    . CARPAL **Note De-Identified Haydon Obfuscation** TUNNEL RELEASE  10/30/2011   Procedure: CARPAL TUNNEL RELEASE;  Surgeon: Wynonia Sours, MD;  Location: [place];  Service: Orthopedics;  Laterality: Right;  and mass excision  . CHOLECYSTECTOMY    . CHOLESTEATOMA EXCISION     right ear  . left shoulder spurs    . ORIF right lower leg    . partial throidectomy    . TOTAL ABDOMINAL HYSTERECTOMY      Social History   Socioeconomic History  . Marital status: Married    Spouse name: Not on file  . Number of children: 3  . Years of education: 9  . Highest education level: Not on file  Occupational History  . Occupation: disabled    Employer: DISABLED  Social Needs  . Financial resource strain: Not on file  . Food insecurity:     Worry: Not on file    Inability: Not on file  . Transportation needs:    Medical: Not on file    Non-medical: Not on file  Tobacco Use  . Smoking status: Former Smoker    Packs/day: 1.00    Years: 45.00    Pack years: 45.00    Types: Cigarettes    Last attempt to quit: 04/02/2018    Years since quitting: 0.3  . Smokeless tobacco: Never Used  Substance and Sexual Activity  . Alcohol use: No  . Drug use: No  . Sexual activity: Not on file  Lifestyle  . Physical activity:    Days per week: Not on file    Minutes per session: Not on file  . Stress: Not on file  Relationships  . Social connections:    Talks on phone: Not on file    Gets together: Not on file    Attends religious service: Not on file    Active member of club or organization: Not on file    Attends meetings of clubs or organizations: Not on file    Relationship status: Not on file  . Intimate partner violence:    Fear of current or ex partner: Not on file    Emotionally abused: Not on file    Physically abused: Not on file    Forced sexual activity: Not on file  Other Topics Concern  . Not on file  Social History Narrative   Married w/ children   Daily caffeine use    Family History  Problem Relation Age of Onset  . Emphysema Sister   . Hypertension Sister   . Heart attack Sister   . Emphysema Sister   . Alpha-1 antitrypsin deficiency Sister   . Alpha-1 antitrypsin deficiency Sister   . Allergies Sister   . Asthma Sister   . Heart disease Mother   . Cancer Mother   . Hyperlipidemia Mother   . Hypertension Mother   . Heart attack Mother   . Heart disease Father   . Heart attack Father   . Cancer Sister   . Cancer Brother   . Hyperlipidemia Brother   . Hypertension Brother   . Tuberculosis Other        grandmother  . Hyperlipidemia Daughter   . Hypertension Daughter   . Hypertension Son     ROS: no fevers or chills, productive cough, hemoptysis, dysphasia, odynophagia, melena, hematochezia,  dysuria, hematuria, rash, seizure activity, orthopnea, PND, pedal edema, claudication. Remaining systems are negative.  Physical Exam: Well-developed well-nourished in no acute distress.  Skin is warm and dry.  HEENT is normal. **Note De-Identified Morina Obfuscation** Neck is supple.  Chest is clear to auscultation with normal expansion.  Cardiovascular exam is regular rate and rhythm.  Abdominal exam nontender or distended. No masses palpated. Extremities show no edema. neuro grossly intact  ECG- personally reviewed  A/P  1  Kirk Ruths, MD

## 2018-08-03 DIAGNOSIS — G4733 Obstructive sleep apnea (adult) (pediatric): Secondary | ICD-10-CM | POA: Diagnosis not present

## 2018-08-04 ENCOUNTER — Ambulatory Visit: Payer: Medicare Other | Admitting: Family Medicine

## 2018-08-08 ENCOUNTER — Encounter: Payer: Self-pay | Admitting: Cardiology

## 2018-08-08 ENCOUNTER — Ambulatory Visit (INDEPENDENT_AMBULATORY_CARE_PROVIDER_SITE_OTHER): Payer: Medicare Other | Admitting: Family Medicine

## 2018-08-08 VITALS — BP 123/70 | HR 125 | Temp 97.1°F | Ht 65.0 in | Wt 254.0 lb

## 2018-08-08 DIAGNOSIS — Z8249 Family history of ischemic heart disease and other diseases of the circulatory system: Secondary | ICD-10-CM

## 2018-08-08 DIAGNOSIS — F418 Other specified anxiety disorders: Secondary | ICD-10-CM

## 2018-08-08 DIAGNOSIS — Z720 Tobacco use: Secondary | ICD-10-CM

## 2018-08-08 MED ORDER — LORAZEPAM 0.5 MG PO TABS: ORAL_TABLET | ORAL | 1 refills | Status: DC

## 2018-08-08 MED ORDER — FLUOXETINE HCL 10 MG PO TABS: 10.0000 mg | ORAL_TABLET | Freq: Every day | ORAL | 1 refills | Status: DC

## 2018-08-08 NOTE — Progress Notes (Signed)
**Note De-Identified Hailey Hernandez** Subjective: CC: Altered mentation f/u PCP: Hailey Norlander, DO NOI:[id number] Hailey Hernandez is a 61 y.o. female presenting to clinic today for:  1. Concern for brain aneurysm Patient reports that her brother was recently told he had 3 brain aneurysms.  There is plan for further investigation of the aneurysms to determine if surgical intervention is needed.  She reports that her sister also has a history of a small brain aneurysm and that her mother actually had a brain aneurysm rupture many years ago.  She states that her brother's physician recommended that his first-degree relatives be screened for brain aneurysms given the likely hereditary etiology.  She has had MRI scans in the past and notes that she does get anxiety and claustrophobia.  She has no known artificial pacemaker in place.  2. Depression: She has started smoking again and states that her depressive symptoms seem to be better with this.  She did not tolerate even low-dose of Cymbalta, stating that it made her very nauseous and she had to discontinue the medication.  She feels optimistic and less depressed lately because her sister is doing better with her cancer.  She does report worsening breathing secondary to starting smoking again.  She reports chronic fatigue.  Previously treated with Zoloft as well.  She is not sure why it was stopped.  ROS: Per HPI  Allergies  Allergen Reactions  . Doxycycline Shortness Of Breath and Other (See Comments)    Drug interaction with Soriatane  . Tramadol Shortness Of Breath    Did not work  . Milnacipran     REACTION: dizzy  . Apremilast Rash    Rash, able to take this currently   Past Medical History:  Diagnosis Date  . Allergy   . Anxiety disorder   . Arthritis    bil legs  . Asthma   . Cancer ([place])   . Chronic bronchitis   . COPD (chronic obstructive pulmonary disease) ([place])   . Depression   . Diverticulitis   . DJD (degenerative joint disease)    spine  . Fibromyalgia   .  GERD (gastroesophageal reflux disease)   . Glaucoma   . History of thyroid cancer   . HTN (hypertension)    no longer on BP medication  . Hyperlipidemia   . Impaired glucose tolerance 05/08/2013  . OSA (obstructive sleep apnea)    CPAP  last sleep study 8-10 yr.ag0  . Polymyalgia ([place])   . Psoriasis   . Rhinitis   . Tobacco abuse     Current Outpatient Medications:  .  albuterol (PROVENTIL HFA;VENTOLIN HFA) 108 (90 Base) MCG/ACT inhaler, Inhale 2 puffs into the lungs every 6 (six) hours as needed for wheezing or shortness of breath., Disp: 1 Inhaler, Rfl: 12 .  albuterol (PROVENTIL) (2.5 MG/3ML) 0.083% nebulizer solution, Take 3 mLs (2.5 mg total) by nebulization every 4 (four) hours as needed for wheezing or shortness of breath. DX: 496, Disp: 180 vial, Rfl: 11 .  aspirin EC 81 MG tablet, Take 81 mg by mouth daily., Disp: , Rfl:  .  atorvastatin (LIPITOR) 80 MG tablet, Take 1 tablet (80 mg total) by mouth daily., Disp: 90 tablet, Rfl: 0 .  budesonide-formoterol (SYMBICORT) 160-4.5 MCG/ACT inhaler, Inhale 2 puffs then rinse mouth, twice daily (Patient taking differently: Inhale 2 puffs into the lungs daily as needed (SOB, wheezing). ), Disp: 1 Inhaler, Rfl: 12 .  colchicine 0.6 MG tablet, Take 0.6 mg by mouth daily., Disp: , Rfl:  . **Note De-Identified [name]** famotidine (PEPCID) 20 MG tablet, TAKE  (1)  TABLET TWICE A DAY. (Patient taking differently: Take 20 mg by mouth 2 (two) times daily. ), Disp: 60 tablet, Rfl: 4 .  fluticasone (FLONASE) 50 MCG/ACT nasal spray, Place 2 sprays into both nostrils daily. (Patient taking differently: Place 2 sprays into both nostrils daily as needed for allergies. ), Disp: 16 g, Rfl: 6 .  folic acid (FOLVITE) 1 MG tablet, Take 1 mg by mouth daily. , Disp: , Rfl:  .  hydrALAZINE (APRESOLINE) 10 MG tablet, Take 1 tablet (10 mg total) by mouth 2 (two) times daily as needed., Disp: 10 tablet, Rfl: 0 .  lisinopril (PRINIVIL,ZESTRIL) 10 MG tablet, Take 1 tablet (10 mg total) by mouth  daily., Disp: 90 tablet, Rfl: 3 .  methotrexate (RHEUMATREX) 2.5 MG tablet, Take 20 mg by mouth once a week. Monday, Disp: , Rfl:  .  montelukast (SINGULAIR) 10 MG tablet, Take 1 tablet (10 mg total) by mouth daily., Disp: 90 tablet, Rfl: 0 .  Nebulizers (COMPRESSOR/NEBULIZER) MISC, Use up to 4 times daily when needed, Disp: 1 each, Rfl: 0 .  DULoxetine (CYMBALTA) 20 MG capsule, Take 1 capsule (20 mg total) by mouth daily. (Patient not taking: Reported on 08/08/2018), Disp: 30 capsule, Rfl: 1 Social History   Socioeconomic History  . Marital status: Married    Spouse name: Not on file  . Number of children: 3  . Years of education: 9  . Highest education level: Not on file  Occupational History  . Occupation: disabled    Employer: DISABLED  Social Needs  . Financial resource strain: Not on file  . Food insecurity:    Worry: Not on file    Inability: Not on file  . Transportation needs:    Medical: Not on file    Non-medical: Not on file  Tobacco Use  . Smoking status: Former Smoker    Packs/day: 1.00    Years: 45.00    Pack years: 45.00    Types: Cigarettes    Last attempt to quit: 04/02/2018    Years since quitting: 0.3  . Smokeless tobacco: Never Used  Substance and Sexual Activity  . Alcohol use: No  . Drug use: No  . Sexual activity: Not on file  Lifestyle  . Physical activity:    Days per week: Not on file    Minutes per session: Not on file  . Stress: Not on file  Relationships  . Social connections:    Talks on phone: Not on file    Gets together: Not on file    Attends religious service: Not on file    Active member of club or organization: Not on file    Attends meetings of clubs or organizations: Not on file    Relationship status: Not on file  . Intimate partner violence:    Fear of current or ex partner: Not on file    Emotionally abused: Not on file    Physically abused: Not on file    Forced sexual activity: Not on file  Other Topics Concern  . Not  on file  Social History Narrative   Married w/ children   Daily caffeine use   Family History  Problem Relation Age of Onset  . Emphysema Sister   . Hypertension Sister   . Heart attack Sister   . Emphysema Sister   . Alpha-1 antitrypsin deficiency Sister   . Alpha-1 antitrypsin deficiency Sister   . Allergies Sister   . **Note De-Identified Toney Hernandez** Asthma Sister   . Heart disease Mother   . Cancer Mother   . Hyperlipidemia Mother   . Hypertension Mother   . Heart attack Mother   . Heart disease Father   . Heart attack Father   . Cancer Sister   . Cancer Brother   . Hyperlipidemia Brother   . Hypertension Brother   . Tuberculosis Other        grandmother  . Hyperlipidemia Daughter   . Hypertension Daughter   . Hypertension Son     Objective: Office vital signs reviewed. BP 123/70   Pulse (!) 125   Temp (!) 97.1 F (36.2 Hailey) (Oral)   Ht 5\' 5"  (1.651 m)   Wt 254 lb (115.2 kg)   BMI 42.27 kg/m   Physical Examination:  General: Awake, alert, chronically ill appearing. No acute distress Cardio: sinus tachycardia, S1S2 heard, no murmurs appreciated Pulm: Globally decreased breath sounds with no wheezes, rhonchi or rales; normal work of breathing on 3 L of home oxygen Psych: Mood stable.  Affect appropriate.  Good eye contact.  Thought process normal.   Depression screen [facility] 2/9 08/08/2018 07/02/2018 06/18/2018  Decreased Interest 0 0 0  Down, Depressed, Hopeless 0 3 0  PHQ - 2 Score 0 3 0  Altered sleeping 0 1 -  Tired, decreased energy 0 3 -  Change in appetite 0 0 -  Feeling bad or failure about yourself  0 0 -  Trouble concentrating 0 0 -  Moving slowly or fidgety/restless 0 0 -  Suicidal thoughts 0 0 -  PHQ-9 Score 0 7 -  Difficult doing work/chores Not difficult at all Not difficult at all -  Some recent data might be hidden   Assessment/ Plan: 62 y.o. female   1. Depression with anxiety PHQ 9 score of 0 today.  Did not tolerate Cymbalta but does want to pursue antidepressant.  Prozac 10mg  started.  F/u 1 month  2. Tobacco use Discussed need for cessation at length today.  Patient is contemplative.  Recheck in 1 months.  3. Family history of aneurysm 3 first degree relatives with brain aneurysms.  Check MRI of the brain.  Will need to check this yearly for the next 3 years.  In the absence of brain aneurysms, can space out to q. 5 years.  Ativan has been sent to the pharmacy to use prior to MRI.  We discussed the sedative nature of the medicine. - MR Angiogram Head Wo Contrast; Future   Orders Placed This Encounter  Procedures  . MR Angiogram Head Wo Contrast    Standing Status:   Future    Standing Expiration Date:   10/09/2019    Order Specific Question:   ** REASON FOR EXAM (FREE TEXT)    Answer:   3 first degree relatives with brain aneurysms (sis, bro, mother)    Order Specific Question:   What is the patient's sedation requirement?    Answer:   No Sedation    Order Specific Question:   Does the patient have a pacemaker or implanted devices?    Answer:   No    Order Specific Question:   Preferred imaging location?    Answer:   [facility] (table limit-350lbs)    Order Specific Question:   Radiology Contrast Protocol - do NOT remove file path    Answer:   [email or web address]   Meds ordered this encounter  Medications  . FLUoxetine (PROZAC) 10 MG tablet **Note De-Identified Crewe Hernandez** Sig: Take 1 tablet (10 mg total) by mouth daily.    Dispense:  30 tablet    Refill:  1  . LORazepam (ATIVAN) 0.5 MG tablet    Sig: Take 1 tablet 30 minutes before MRI.  May repeat 1 time if needed.    Dispense:  30 tablet    Refill:  1   The Narcotic Database has been reviewed.  There were no red flags.     Hailey Norlander, DO [place] [phone]

## 2018-08-08 NOTE — Patient Instructions (Signed)
**Note De-Identified Tolles Obfuscation** Start fluoxetine.  Stop smoking.  See me in 1 month for recheck.  You will be contacted for the brain scan for aneurysm.  Taking the medicine as directed and not missing any doses is one of the best things you can do to treat your depression.  Here are some things to keep in mind:  1) Side effects (stomach upset, some increased anxiety) may happen before you notice a benefit.  These side effects typically go away over time. 2) Changes to your dose of medicine or a change in medication all together is sometimes necessary 3) Most people need to be on medication at least 12 months 4) Many people will notice an improvement within two weeks but the full effect of the medication can take up to 4-6 weeks 5) Stopping the medication when you start feeling better often results in a return of symptoms 6) Never discontinue your medication without contacting a health care professional first.  Some medications require gradual discontinuation/ taper and can make you sick if you stop them abruptly.  If your symptoms worsen or you have thoughts of suicide/homicide, PLEASE SEEK IMMEDIATE MEDICAL ATTENTION.  You may always call:  National Suicide Hotline: 705-156-1897 [place]: 818-689-3710 Crisis Recovery in [place]: [phone]   These are available 24 hours a day, 7 days a week.   Cerebral Aneurysm An aneurysm is the bulging or ballooning out of part of the weakened wall of a vein or artery. An aneurysm in the vein or artery of the brain is called a brain aneurysm, or cerebral aneurysm. Aneurysms are a risk to your health because they may leak or rupture. Once the aneurysm leaks or ruptures, bleeding occurs. If the bleeding occurs within the brain tissue, the condition is called an intracerebral hemorrhage. An intracerebral hemorrhage can result in a hemorrhagic stroke. If the bleeding occurs in the area between the brain and the thin tissues that cover the brain, the condition is  called a subarachnoid hemorrhage. This increases the pressure on the brain and causes some areas of the brain to not get the necessary blood flow. The blood from the ruptured aneurysm collects and presses on the surrounding brain tissue. A subarachnoid hemorrhage can cause a stroke. A ruptured cerebral aneurysm is a medical emergency. This can cause permanent damage and loss of brain function. What are the causes? A cerebral aneurysm is caused when a weakened part of the blood vessel expands. The blood vessel expands due to the constant pressure from the flow of blood through the weakened blood vessel. Usually the aneurysm expands slowly. As the weakened aneurysm expands, the walls of the aneurysm become weaker. Aneurysms may be associated with diseases that weaken and damage the walls of your blood vessels or blood vessels that develop abnormally. Some known causes for cerebral aneurysms are:  Head trauma.  Infection.  Use of "recreational drugs" such as cocaine or amphetamines.  What increases the risk? People at risk for a cerebral aneurysm or hemorrhagic stroke usually have one or more risk factors, which include:  Having high blood pressure (hypertension).  Abusing alcohol.  Having abnormal blood vessels present since birth.  Having certain bleeding disorders, such as hemophilia, sickle cell disease, or liver disease.  Taking blood thinners (anticoagulants).  Smoking.  Having a family history of aneurysm.  What are the signs or symptoms? The signs and symptoms of an unruptured cerebral aneurysm will partly depend on its size and rate of growth. A small, unchanging aneurysm generally does **Note De-Identified Charo Obfuscation** not produce symptoms. A larger aneurysm that is steadily growing can increase pressure on the brain or nerves. That increased pressure from the unruptured cerebral aneurysm can cause:  A headache.  Problems with your vision.  Numbness or weakness in an arm or leg.  Problems with  memory.  Problems speaking.  Seizures.  If an aneurysm leaks or bursts, it can cause a stroke and be life-threatening. Symptoms may include:  A sudden, severe headache with no known cause. The headache is often described as the worst headache ever experienced.  Nausea or vomiting, especially when combined with other symptoms such as a headache.  Sudden weakness or numbness of the face, arm, or leg, especially on one side of the body.  Sudden trouble walking or difficulty moving arms or legs.  Sudden confusion.  Sudden personality changes.  Trouble speaking (aphasia) or understanding.  Difficulty swallowing.  Sudden trouble seeing in one or both eyes.  Double vision.  Dizziness.  Loss of balance or coordination.  Intolerance to light.  Stiff neck.  How is this diagnosed? Your health care provider may use one of the following tests to diagnose your aneurysm:  Computed tomographic angiography (CTA). This test uses dye and a scanner to produce images of your blood vessels.  Magnetic resonance angiography (MRA). This test uses an MRI machine to produce images of your blood vessels.  Digital subtraction angiography (DSA). This test uses dye and X-rays to take images of your blood vessels. Your health care provider may use this test to help determine the best course of treatment.  How is this treated? Unruptured Aneurysms Treatment is complex when an aneurysm is found and it is not causing problems. Treatment is very individualized, as each case is different. Many things must be considered, such as the size and exact location of your aneurysm, your age, your overall health, and your feelings and preferences. Small aneurysms in certain locations of the brain have a very low chance of bleeding or rupturing. These small aneurysms may not be treated. However, depending on the size and location of the aneurysm, treatments may be recommended and include:  Coiling. During this  procedure, a catheter is inserted and advanced through a blood vessel. Once the catheter reaches the aneurysm, tiny coils are used to block blood flow into the aneurysm.  Surgical clipping. During surgery, a clip is placed at the base of the aneurysm. The clip prevents blood from continuing to enter the aneurysm.  Flow diversion. This procedure is used to divert blood flow around the aneurysm.  Ruptured Aneurysms Immediate emergency surgery may be needed to help prevent damage to the brain and to reduce the risk of rebleeding. Timing of treatment is an important factor in the prevention of complications. Successful early treatment of a ruptured aneurysm (within the first 3 days of a bleed) helps to prevent rebleeding and blood vessel spasm. In some cases, there may be a reason to treat later (10-14 days after a rupture). Many things are considered when making this decision, and each case is handled individually. Follow these instructions at home:  Take medicines only as instructed by your health care provider.  Eat healthy foods. It is recommended that you eat 5 or more servings of fruits and vegetables each day. Foods may need to be a special consistency (soft or pureed), or small bites may need to be taken if you have had a ruptured aneurysm or stroke. Certain dietary changes may be advised to address high blood pressure, high **Note De-Identified Leech Obfuscation** cholesterol, diabetes, or obesity. ? Food choices that are low in salt (sodium), saturated fat, trans fat, and cholesterol are recommended to manage high blood pressure. ? Food choices that are high in fiber and low in saturated fat, trans fat, and cholesterol are recommended to control cholesterol levels. ? Controlling carbohydrate and sugar intake is recommended to manage diabetes. ? Reducing calorie intake and making food choices that are low in sodium, saturated fat, trans fat, and cholesterol are recommended to manage obesity.  Maintain a healthy weight.  Stay  physically active. It is recommended that you get at least 30 minutes of activity on most or all days.  Do not smoke.  Limit alcohol use. Moderate alcohol use is considered to be: ? No more than 2 drinks each day for men. ? No more than 1 drink each day for nonpregnant women.  Stop drug abuse.  A safe home environment is important to reduce the risk of falls. Your health care provider may arrange for specialists to evaluate your home. Having grab bars in the bedroom and bathroom is often important. Your health care provider may arrange for special equipment to be used at home, such as raised toilets and a seat for the shower.  Physical, occupational, and speech therapy. Ongoing therapy may be needed to maximize your recovery after a ruptured aneurysm or stroke. If you have been advised to use a walker or a cane, use it at all times. Be sure to keep your therapy appointments.  Follow all instructions for follow-up with your health care provider. This is very important. This includes any referrals, physical therapy, rehabilitation, and laboratory tests. Proper follow-up may prevent an aneurysm rupture or a stroke. Get help right away if:  You have a sudden, severe headache with no known cause.  You have sudden nausea or vomiting with a severe headache.  You have sudden weakness or numbness of the face, arm, or leg, especially on one side of the body.  You have sudden trouble walking or difficulty moving arms or legs.  You have sudden confusion.  You have trouble speaking or understanding.  You have sudden trouble seeing in one or both eyes.  You have a sudden loss of balance or coordination.  You have a stiff neck.  You have difficulty breathing.  You have a partial or total loss of consciousness. Any of these symptoms may represent a serious problem that is an emergency. Do not wait to see if the symptoms will go away. Get medical help at once. Call your local emergency services  (911 in [place] Do not drive yourself to the hospital. This information is not intended to replace advice given to you by your health care provider. Make sure you discuss any questions you have with your health care provider. Document Released: 06/09/2002 Document Revised: 02/23/2016 Document Reviewed: 03/05/2013 Elsevier Interactive Patient Education  2017 [employer].

## 2018-08-11 ENCOUNTER — Ambulatory Visit: Payer: Medicare Other | Admitting: Cardiology

## 2018-08-11 DIAGNOSIS — L400 Psoriasis vulgaris: Secondary | ICD-10-CM | POA: Diagnosis not present

## 2018-08-11 DIAGNOSIS — G4733 Obstructive sleep apnea (adult) (pediatric): Secondary | ICD-10-CM | POA: Diagnosis not present

## 2018-08-11 DIAGNOSIS — J449 Chronic obstructive pulmonary disease, unspecified: Secondary | ICD-10-CM | POA: Diagnosis not present

## 2018-08-11 DIAGNOSIS — T85511A Breakdown (mechanical) of esophageal anti-reflux device, initial encounter: Secondary | ICD-10-CM | POA: Diagnosis not present

## 2018-08-18 DIAGNOSIS — Z5181 Encounter for therapeutic drug level monitoring: Secondary | ICD-10-CM | POA: Diagnosis not present

## 2018-08-18 DIAGNOSIS — L409 Psoriasis, unspecified: Secondary | ICD-10-CM | POA: Diagnosis not present

## 2018-08-18 DIAGNOSIS — J449 Chronic obstructive pulmonary disease, unspecified: Secondary | ICD-10-CM | POA: Diagnosis not present

## 2018-08-26 ENCOUNTER — Encounter (HOSPITAL_COMMUNITY): Payer: Self-pay

## 2018-08-26 ENCOUNTER — Ambulatory Visit (HOSPITAL_COMMUNITY): Admission: RE | Admit: 2018-08-26 | Payer: Medicare Other | Source: Ambulatory Visit

## 2018-09-02 DIAGNOSIS — G4733 Obstructive sleep apnea (adult) (pediatric): Secondary | ICD-10-CM | POA: Diagnosis not present

## 2018-09-05 ENCOUNTER — Ambulatory Visit: Payer: Medicare Other | Admitting: Internal Medicine

## 2018-09-08 ENCOUNTER — Ambulatory Visit (INDEPENDENT_AMBULATORY_CARE_PROVIDER_SITE_OTHER): Payer: Medicare Other

## 2018-09-08 ENCOUNTER — Encounter: Payer: Self-pay | Admitting: Family Medicine

## 2018-09-08 ENCOUNTER — Ambulatory Visit (INDEPENDENT_AMBULATORY_CARE_PROVIDER_SITE_OTHER): Payer: Medicare Other | Admitting: Family Medicine

## 2018-09-08 ENCOUNTER — Other Ambulatory Visit: Payer: Self-pay | Admitting: Family Medicine

## 2018-09-08 VITALS — BP 112/80 | HR 111 | Temp 97.4°F | Ht 65.0 in | Wt 249.0 lb

## 2018-09-08 DIAGNOSIS — R05 Cough: Secondary | ICD-10-CM

## 2018-09-08 DIAGNOSIS — R059 Cough, unspecified: Secondary | ICD-10-CM

## 2018-09-08 DIAGNOSIS — F329 Major depressive disorder, single episode, unspecified: Secondary | ICD-10-CM

## 2018-09-08 DIAGNOSIS — F419 Anxiety disorder, unspecified: Secondary | ICD-10-CM

## 2018-09-08 DIAGNOSIS — J441 Chronic obstructive pulmonary disease with (acute) exacerbation: Secondary | ICD-10-CM

## 2018-09-08 DIAGNOSIS — R0602 Shortness of breath: Secondary | ICD-10-CM

## 2018-09-08 DIAGNOSIS — J189 Pneumonia, unspecified organism: Secondary | ICD-10-CM

## 2018-09-08 MED ORDER — FLUOXETINE HCL 20 MG PO TABS: 20.0000 mg | ORAL_TABLET | Freq: Every day | ORAL | 1 refills | Status: DC

## 2018-09-08 MED ORDER — CEFDINIR 300 MG PO CAPS: 300.0000 mg | ORAL_CAPSULE | Freq: Two times a day (BID) | ORAL | 0 refills | Status: DC

## 2018-09-08 MED ORDER — PREDNISONE 20 MG PO TABS: 40.0000 mg | ORAL_TABLET | Freq: Every day | ORAL | 0 refills | Status: AC

## 2018-09-08 MED ORDER — ALBUTEROL SULFATE HFA 108 (90 BASE) MCG/ACT IN AERS: 2.0000 | INHALATION_SPRAY | Freq: Four times a day (QID) | RESPIRATORY_TRACT | 12 refills | Status: DC | PRN

## 2018-09-08 NOTE — Patient Instructions (Signed)
**Note De-Identified Laye Obfuscation** We have increased your Prozac to 20mg .  See me in 2 months to recheck Call tomorrow to talk to [facility] about rescheduling your MRI brain I am treating you with antibiotics and with steroids for your COPD flare. Use the albuterol every 6 hours for the next 2 days then as needed as directed.
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
## 2018-09-10 DIAGNOSIS — G4733 Obstructive sleep apnea (adult) (pediatric): Secondary | ICD-10-CM | POA: Diagnosis not present

## 2018-09-10 DIAGNOSIS — L400 Psoriasis vulgaris: Secondary | ICD-10-CM | POA: Diagnosis not present

## 2018-09-10 DIAGNOSIS — J449 Chronic obstructive pulmonary disease, unspecified: Secondary | ICD-10-CM | POA: Diagnosis not present

## 2018-09-10 DIAGNOSIS — T85511A Breakdown (mechanical) of esophageal anti-reflux device, initial encounter: Secondary | ICD-10-CM | POA: Diagnosis not present

## 2018-10-03 DIAGNOSIS — G4733 Obstructive sleep apnea (adult) (pediatric): Secondary | ICD-10-CM | POA: Diagnosis not present

## 2018-10-06 ENCOUNTER — Other Ambulatory Visit: Payer: Self-pay | Admitting: Family Medicine

## 2018-10-11 DIAGNOSIS — J449 Chronic obstructive pulmonary disease, unspecified: Secondary | ICD-10-CM | POA: Diagnosis not present

## 2018-10-11 DIAGNOSIS — T85511A Breakdown (mechanical) of esophageal anti-reflux device, initial encounter: Secondary | ICD-10-CM | POA: Diagnosis not present

## 2018-10-11 DIAGNOSIS — L400 Psoriasis vulgaris: Secondary | ICD-10-CM | POA: Diagnosis not present

## 2018-10-11 DIAGNOSIS — G4733 Obstructive sleep apnea (adult) (pediatric): Secondary | ICD-10-CM | POA: Diagnosis not present

## 2018-10-13 ENCOUNTER — Other Ambulatory Visit: Payer: Self-pay | Admitting: Family Medicine

## 2018-10-13 ENCOUNTER — Other Ambulatory Visit (INDEPENDENT_AMBULATORY_CARE_PROVIDER_SITE_OTHER): Payer: Medicare Other

## 2018-10-13 DIAGNOSIS — J189 Pneumonia, unspecified organism: Secondary | ICD-10-CM | POA: Diagnosis not present

## 2018-10-13 DIAGNOSIS — Z8701 Personal history of pneumonia (recurrent): Secondary | ICD-10-CM | POA: Diagnosis not present

## 2018-10-15 ENCOUNTER — Ambulatory Visit: Payer: Medicare Other | Admitting: Nurse Practitioner

## 2018-10-15 ENCOUNTER — Encounter: Payer: Self-pay | Admitting: Nurse Practitioner

## 2018-10-15 VITALS — BP 132/70 | HR 105 | Temp 98.2°F | Ht 65.0 in | Wt 240.3 lb

## 2018-10-15 DIAGNOSIS — J181 Lobar pneumonia, unspecified organism: Secondary | ICD-10-CM | POA: Diagnosis not present

## 2018-10-15 DIAGNOSIS — J189 Pneumonia, unspecified organism: Secondary | ICD-10-CM | POA: Insufficient documentation

## 2018-10-15 MED ORDER — PREDNISONE 10 MG PO TABS: ORAL_TABLET | ORAL | 0 refills | Status: DC

## 2018-10-15 MED ORDER — LEVOFLOXACIN 750 MG PO TABS: 750.0000 mg | ORAL_TABLET | Freq: Every day | ORAL | 0 refills | Status: DC

## 2018-10-15 MED ORDER — LEVALBUTEROL HCL 0.63 MG/3ML IN NEBU
0.6300 mg | INHALATION_SOLUTION | Freq: Once | RESPIRATORY_TRACT | Status: AC
  Administered 2018-10-15: 0.63 mg via RESPIRATORY_TRACT

## 2018-10-15 MED ORDER — METHYLPREDNISOLONE ACETATE 80 MG/ML IJ SUSP
80.0000 mg | Freq: Once | INTRAMUSCULAR | Status: AC
  Administered 2018-10-15: 80 mg via INTRAMUSCULAR

## 2018-10-15 NOTE — Patient Instructions (Addendum)
**Note De-Identified Rainone Obfuscation** Will give DepoMedrol in office today Will give xopenex neb treatment in office today Will order Levaquin Will order prednisone taper May take mucinex May take delsym Continue Symbicort Continue Proventil as needed Go to the ED if symptoms worsen or call the office Close follow up on Monday with Dr. Annamaria Boots or NP

## 2018-10-15 NOTE — Progress Notes (Signed)
**Note De-Identified [place] Obfuscation** @Patient  ID: Hailey Hernandez, female    DOB: Aug 24, 1956, 63 y.o.   MRN: [id number]  Chief Complaint  Patient presents with  . Shortness of Breath    Seen by [facility] on 09/08/18, Follow up chest xray 10/13/18.    Referring provider: [name], DO  HPI  63 year old female former smoker with allergic rhinitis, chronic bronchitis, chronic respiratory failure with hypoxemia, and OSA followed by Dr. Annamaria Hernandez.  Tests: CXR 10/13/18>>Worsening right middle lobe opacity. Recommend CT imaging for better evaluation.  CXR 09/09/19>>Lingular atelectasis or pneumonia superimposed upon chronic bronchitic-smoking related changes. No CHF.   OV 10/15/18 - cough and chest congestion/pneumonia Patient presents with cough and chest congestion.  She was seen by her PCP on 09/09/2019.  She was prescribed Omnicef for pneumonia seen on chest x-ray at that visit.  She did not take her medicine correctly.  She only took 1 tablet a day.  She returned to her PCP on 10/13/2018 complaining of worsening symptoms.  Chest x-ray at that visit showed worsening pneumonia.  She was advised to go to the ED.  She was reluctant to go to the hospital so she made her follow-up appointment with [place] today.  She states that her cough is productive of yellow sputum.  He denies any fever.  She is on O2 continuously at 2 to 3 L nasal cannula.  Her O2 sats in the office today are 96%.  Her vital signs are stable today.  She is afebrile in the office today.     Allergies  Allergen Reactions  . Doxycycline Shortness Of Breath and Other (See Comments)    Drug interaction with Soriatane  . Tramadol Shortness Of Breath    Did not work  . Milnacipran     REACTION: dizzy  . Apremilast Rash    Rash, able to take this currently    Immunization History  Administered Date(s) Administered  . Influenza Split 08/10/2011, 09/15/2012, 05/24/2017  . Influenza Whole 07/14/2010  . Influenza,inj,Quad PF,6+ Mos 06/05/2013, 05/21/2014,  06/10/2015, 09/21/2016, 07/02/2018  . PPD Test 05/17/2014, 06/30/2015, 08/06/2016  . Pneumococcal Conjugate-13 10/16/2013  . Pneumococcal Polysaccharide-23 07/14/2010, 06/07/2017  . Tdap 04/25/2012    Past Medical History:  Diagnosis Date  . Allergy   . Anxiety disorder   . Arthritis    bil legs  . Asthma   . Cancer ([place])   . Chronic bronchitis   . COPD (chronic obstructive pulmonary disease) ([place])   . Depression   . Diverticulitis   . DJD (degenerative joint disease)    spine  . Fibromyalgia   . GERD (gastroesophageal reflux disease)   . Glaucoma   . History of thyroid cancer   . HTN (hypertension)    no longer on BP medication  . Hyperlipidemia   . Impaired glucose tolerance 05/08/2013  . OSA (obstructive sleep apnea)    CPAP  last sleep study 8-10 yr.ag0  . Polymyalgia ([place])   . Psoriasis   . Rhinitis   . Tobacco abuse     Tobacco History: Social History   Tobacco Use  Smoking Status Former Smoker  . Packs/day: 1.00  . Years: 45.00  . Pack years: 45.00  . Types: Cigarettes  . Last attempt to quit: 04/02/2018  . Years since quitting: 0.5  Smokeless Tobacco Never Used   Counseling given: Yes   Outpatient Encounter Medications as of 10/15/2018  Medication Sig  . albuterol (PROVENTIL HFA;VENTOLIN HFA) 108 (90 Base) MCG/ACT inhaler Inhale 2 puffs **Note De-Identified [place] Obfuscation** into the lungs every 6 (six) hours as needed for wheezing or shortness of breath.  Marland Kitchen albuterol (PROVENTIL) (2.5 MG/3ML) 0.083% nebulizer solution Take 3 mLs (2.5 mg total) by nebulization every 4 (four) hours as needed for wheezing or shortness of breath. DX: 496  . aspirin EC 81 MG tablet Take 81 mg by mouth daily.  Marland Kitchen atorvastatin (LIPITOR) 80 MG tablet TAKE 1 TABLET DAILY  . budesonide-formoterol (SYMBICORT) 160-4.5 MCG/ACT inhaler Inhale 2 puffs then rinse mouth, twice daily (Patient taking differently: Inhale 2 puffs into the lungs daily as needed (SOB, wheezing). )  . cefdinir (OMNICEF) 300 MG capsule Take 1 capsule  (300 mg total) by mouth 2 (two) times daily. 1 po BID  . colchicine 0.6 MG tablet Take 0.6 mg by mouth daily.  . famotidine (PEPCID) 20 MG tablet TAKE  (1)  TABLET TWICE A DAY. (Patient taking differently: Take 20 mg by mouth 2 (two) times daily. )  . FLUoxetine (PROZAC) 20 MG tablet Take 1 tablet (20 mg total) by mouth daily.  . fluticasone (FLONASE) 50 MCG/ACT nasal spray Place 2 sprays into both nostrils daily. (Patient taking differently: Place 2 sprays into both nostrils daily as needed for allergies. )  . folic acid (FOLVITE) 1 MG tablet Take 1 mg by mouth daily.   . hydrALAZINE (APRESOLINE) 10 MG tablet Take 1 tablet (10 mg total) by mouth 2 (two) times daily as needed.  Marland Kitchen lisinopril (PRINIVIL,ZESTRIL) 10 MG tablet Take 1 tablet (10 mg total) by mouth daily.  . methotrexate (RHEUMATREX) 2.5 MG tablet Take 20 mg by mouth once a week. Monday  . montelukast (SINGULAIR) 10 MG tablet Take 1 tablet (10 mg total) by mouth daily.  . Nebulizers (COMPRESSOR/NEBULIZER) MISC Use up to 4 times daily when needed  . levofloxacin (LEVAQUIN) 750 MG tablet Take 1 tablet (750 mg total) by mouth daily.  . predniSONE (DELTASONE) 10 MG tablet Take 4 tabs for 2 days, then 3 tabs for 2 days, then 2 tabs for 2 days, then 1 tab for 2 days, then stop  . [DISCONTINUED] doxepin (SINEQUAN) 10 MG capsule 1-2 at bedtime   . [DISCONTINUED] fexofenadine (ALLEGRA) 180 MG tablet Take 180 mg by mouth daily.    . [EXPIRED] levalbuterol (XOPENEX) nebulizer solution 0.63 mg   . [EXPIRED] methylPREDNISolone acetate (DEPO-MEDROL) injection 80 mg    No facility-administered encounter medications on file as of 10/15/2018.      Review of Systems  Review of Systems  Constitutional: Negative.  Negative for chills and fever.  HENT: Negative.  Negative for sinus pressure, sinus pain and sore throat.   Respiratory: Positive for cough and shortness of breath.   Cardiovascular: Negative.  Negative for chest pain, palpitations and leg  swelling.  Gastrointestinal: Negative.   Allergic/Immunologic: Negative.   Neurological: Negative.   Psychiatric/Behavioral: Negative.        Physical Exam  BP 132/70 (BP Location: Left Arm, Patient Position: Sitting, Cuff Size: Normal)   Pulse (!) 105   Temp 98.2 F (36.8 C)   Ht 5\' 5"  (1.651 m)   Wt 240 lb 4.8 oz (109 kg)   SpO2 96%   BMI 39.99 kg/m   Wt Readings from Last 5 Encounters:  10/15/18 240 lb 4.8 oz (109 kg)  09/08/18 249 lb (112.9 kg)  08/08/18 254 lb (115.2 kg)  07/02/18 250 lb (113.4 kg)  06/18/18 251 lb (113.9 kg)     Physical Exam Vitals signs and nursing note reviewed.  Constitutional: **Note De-Identified [place] Obfuscation** General: She is not in acute distress.    Appearance: She is well-developed.  Cardiovascular:     Rate and Rhythm: Normal rate and regular rhythm.  Pulmonary:     Effort: Pulmonary effort is normal.     Breath sounds: Examination of the right-middle field reveals rhonchi. Examination of the right-lower field reveals rhonchi. Rhonchi present. No decreased breath sounds or wheezing.  Musculoskeletal:     Right lower leg: No edema.  Neurological:     Mental Status: She is alert and oriented to person, place, and time.      Imaging: Dg Chest 2 View  Result Date: 10/13/2018 CLINICAL DATA:  Recent pneumonia EXAM: CHEST - 2 VIEW COMPARISON:  September 08, 2018 FINDINGS: The right middle lobe opacity has worsened in the interval. The heart, hila, mediastinum, lungs, and pleura are otherwise unchanged and unremarkable. No other acute abnormalities. IMPRESSION: Worsening right middle lobe opacity. Recommend CT imaging for better evaluation. These results will be called to the ordering clinician or representative by the Radiologist Assistant, and communication documented in the PACS or zVision Dashboard. Electronically Signed   By: Dorise Bullion III M.D   On: 10/13/2018 15:04     Assessment & Plan:   Pneumonia of right middle lobe due to infectious organism  [facility]) Patient is stable in the office today.  Will keep close follow up to monitor resolution of pneumonia.  Patient Instructions  Will give DepoMedrol in office today Will give xopenex neb treatment in office today Will order Levaquin Will order prednisone taper May take mucinex May take delsym Continue Symbicort Continue Proventil as needed Go to the ED if symptoms worsen or call the office Close follow up on Monday with Dr. Annamaria Hernandez or NP        Fenton Foy, NP 10/15/2018

## 2018-10-15 NOTE — Assessment & Plan Note (Signed)
**Note De-Identified [place] Obfuscation** Patient is stable in the office today.  Will keep close follow up to monitor resolution of pneumonia.  Patient Instructions  Will give DepoMedrol in office today Will give xopenex neb treatment in office today Will order Levaquin Will order prednisone taper May take mucinex May take delsym Continue Symbicort Continue Proventil as needed Go to the ED if symptoms worsen or call the office Close follow up on Monday with Dr. Annamaria Boots or NP

## 2018-10-20 ENCOUNTER — Ambulatory Visit: Payer: Medicare Other | Admitting: Nurse Practitioner

## 2018-10-20 ENCOUNTER — Encounter: Payer: Self-pay | Admitting: Nurse Practitioner

## 2018-10-20 VITALS — BP 136/70 | HR 103 | Ht 65.0 in | Wt 245.8 lb

## 2018-10-20 DIAGNOSIS — J181 Lobar pneumonia, unspecified organism: Secondary | ICD-10-CM

## 2018-10-20 DIAGNOSIS — J9611 Chronic respiratory failure with hypoxia: Secondary | ICD-10-CM | POA: Diagnosis not present

## 2018-10-20 DIAGNOSIS — J189 Pneumonia, unspecified organism: Secondary | ICD-10-CM

## 2018-10-20 DIAGNOSIS — Z720 Tobacco use: Secondary | ICD-10-CM | POA: Diagnosis not present

## 2018-10-20 MED ORDER — FLUTTER DEVI: 0 refills | Status: AC

## 2018-10-20 NOTE — Patient Instructions (Addendum)
**Note De-Identified [place] Obfuscation** Please complete entire course of Levaquin Please complete prednisone taper  Use flutter valve 3 times daily Please quit smoking before next visit!!! Continue Symbicort Continue Proventil as needed Continue O2  Keep already scheduled appointment with Dr. Annamaria Boots - will order follow up chest x ray to be done before next appointment

## 2018-10-20 NOTE — Assessment & Plan Note (Signed)
**Note De-Identified [place] Obfuscation** Patient admits that she started back smoking 2 months ago. Smoking cessation instruction/counseling given:  counseled patient on the dangers of tobacco use, advised patient to stop smoking, and reviewed strategies to maximize success

## 2018-10-20 NOTE — Progress Notes (Signed)
**Note De-Identified [place] Obfuscation** @Patient  ID: Hailey Hernandez, female    DOB: [birth year], 63 y.o.   MRN: [id number]  Chief Complaint  Patient presents with  . Follow-up    Pneumonia of right middle lobe.    Referring provider: [name], DO  HPI 63 year old female former smoker with allergic rhinitis, chronic bronchitis, chronic respiratory failure with hypoxemia, and OSA followed by Dr. Annamaria Boots.  Tests: CXR 10/13/18>>Worsening right middle lobe opacity. Recommend CT imaging for better evaluation.  CXR 09/09/19>>Lingular atelectasis or pneumonia superimposed upon chronic bronchitic-smoking related changes. No CHF.   OV 10/20/17 - follow up pneumonia Patient presents for a follow-up on pneumonia.  She was last seen by me on 10/15/2018.  She was found to have worsening pneumonia on chest x-ray at PCPs office on 10/13/18.  She was started on Levaquin and a prednisone taper with close follow-up today.  She has not finished these medications yet she still has a couple days left on them.  She states that she is feeling much improved today.  Her cough has been productive of yellow sputum.  Denies any recent fever.  She is on continuous oxygen at 3 L nasal cannula.  She is compliant with her Symbicort and Proventil.  Unfortunately patient admits that she is still smoking.  She had quit smoking, but states that she started smoking again 2 months ago. Vital signs are stable in office today.     Allergies  Allergen Reactions  . Doxycycline Shortness Of Breath and Other (See Comments)    Drug interaction with Soriatane  . Tramadol Shortness Of Breath    Did not work  . Milnacipran     REACTION: dizzy  . Apremilast Rash    Rash, able to take this currently    Immunization History  Administered Date(s) Administered  . Influenza Split 08/10/2011, 09/15/2012, 05/24/2017  . Influenza Whole 07/14/2010  . Influenza,inj,Quad PF,6+ Mos 06/05/2013, 05/21/2014, 06/10/2015, 09/21/2016, 07/02/2018  . PPD Test 05/17/2014,  06/30/2015, 08/06/2016  . Pneumococcal Conjugate-13 10/16/2013  . Pneumococcal Polysaccharide-23 07/14/2010, 06/07/2017  . Tdap 04/25/2012    Past Medical History:  Diagnosis Date  . Allergy   . Anxiety disorder   . Arthritis    bil legs  . Asthma   . Cancer ([place])   . Chronic bronchitis   . COPD (chronic obstructive pulmonary disease) ([place])   . Depression   . Diverticulitis   . DJD (degenerative joint disease)    spine  . Fibromyalgia   . GERD (gastroesophageal reflux disease)   . Glaucoma   . History of thyroid cancer   . HTN (hypertension)    no longer on BP medication  . Hyperlipidemia   . Impaired glucose tolerance 05/08/2013  . OSA (obstructive sleep apnea)    CPAP  last sleep study 8-10 yr.ag0  . Polymyalgia ([place])   . Psoriasis   . Rhinitis   . Tobacco abuse     Tobacco History: Social History   Tobacco Use  Smoking Status Current Every Day Smoker  . Packs/day: 1.00  . Years: 45.00  . Pack years: 45.00  . Types: Cigarettes  Smokeless Tobacco Never Used   Ready to quit: No Counseling given: Yes   Outpatient Encounter Medications as of 10/20/2018  Medication Sig  . albuterol (PROVENTIL HFA;VENTOLIN HFA) 108 (90 Base) MCG/ACT inhaler Inhale 2 puffs into the lungs every 6 (six) hours as needed for wheezing or shortness of breath.  Marland Kitchen albuterol (PROVENTIL) (2.5 MG/3ML) 0.083% nebulizer solution Take 3 mLs ( **Note De-Identified [place] Obfuscation** 2.5 mg total) by nebulization every 4 (four) hours as needed for wheezing or shortness of breath. DX: 496  . aspirin EC 81 MG tablet Take 81 mg by mouth daily.  Marland Kitchen atorvastatin (LIPITOR) 80 MG tablet TAKE 1 TABLET DAILY  . budesonide-formoterol (SYMBICORT) 160-4.5 MCG/ACT inhaler Inhale 2 puffs then rinse mouth, twice daily (Patient taking differently: Inhale 2 puffs into the lungs daily as needed (SOB, wheezing). )  . cefdinir (OMNICEF) 300 MG capsule Take 1 capsule (300 mg total) by mouth 2 (two) times daily. 1 po BID  . colchicine 0.6 MG tablet Take 0.6 mg  by mouth daily.  . famotidine (PEPCID) 20 MG tablet TAKE  (1)  TABLET TWICE A DAY. (Patient taking differently: Take 20 mg by mouth 2 (two) times daily. )  . FLUoxetine (PROZAC) 20 MG tablet Take 1 tablet (20 mg total) by mouth daily.  . fluticasone (FLONASE) 50 MCG/ACT nasal spray Place 2 sprays into both nostrils daily. (Patient taking differently: Place 2 sprays into both nostrils daily as needed for allergies. )  . folic acid (FOLVITE) 1 MG tablet Take 1 mg by mouth daily.   . hydrALAZINE (APRESOLINE) 10 MG tablet Take 1 tablet (10 mg total) by mouth 2 (two) times daily as needed.  Marland Kitchen levofloxacin (LEVAQUIN) 750 MG tablet Take 1 tablet (750 mg total) by mouth daily.  Marland Kitchen lisinopril (PRINIVIL,ZESTRIL) 10 MG tablet Take 1 tablet (10 mg total) by mouth daily.  . methotrexate (RHEUMATREX) 2.5 MG tablet Take 20 mg by mouth once a week. Monday  . montelukast (SINGULAIR) 10 MG tablet Take 1 tablet (10 mg total) by mouth daily.  . Nebulizers (COMPRESSOR/NEBULIZER) MISC Use up to 4 times daily when needed  . predniSONE (DELTASONE) 10 MG tablet Take 4 tabs for 2 days, then 3 tabs for 2 days, then 2 tabs for 2 days, then 1 tab for 2 days, then stop  . Respiratory Therapy Supplies (FLUTTER) DEVI Patient to use device for ten minutes, three times daily  . [DISCONTINUED] doxepin (SINEQUAN) 10 MG capsule 1-2 at bedtime   . [DISCONTINUED] fexofenadine (ALLEGRA) 180 MG tablet Take 180 mg by mouth daily.     No facility-administered encounter medications on file as of 10/20/2018.      Review of Systems  Review of Systems  Constitutional: Negative.  Negative for chills and fever.  HENT: Negative.   Respiratory: Positive for cough and shortness of breath. Negative for wheezing.   Cardiovascular: Negative.  Negative for chest pain, palpitations and leg swelling.  Gastrointestinal: Negative.   Allergic/Immunologic: Negative.   Neurological: Negative.   Psychiatric/Behavioral: Negative.        Physical  Exam  BP 136/70 (BP Location: Left Arm, Patient Position: Sitting, Cuff Size: Normal)   Pulse (!) 103   Ht 5\' 5"  (1.651 m)   Wt 245 lb 12.8 oz (111.5 kg)   SpO2 93% Comment: 3L of O2  BMI 40.90 kg/m   Wt Readings from Last 5 Encounters:  10/20/18 245 lb 12.8 oz (111.5 kg)  10/15/18 240 lb 4.8 oz (109 kg)  09/08/18 249 lb (112.9 kg)  08/08/18 254 lb (115.2 kg)  07/02/18 250 lb (113.4 kg)     Physical Exam Vitals signs and nursing note reviewed.  Constitutional:      General: She is not in acute distress.    Appearance: She is well-developed.  Cardiovascular:     Rate and Rhythm: Normal rate and regular rhythm.  Pulmonary: **Note De-Identified [place] Obfuscation** Effort: Pulmonary effort is normal. No respiratory distress.     Breath sounds: Decreased breath sounds present.  Musculoskeletal:        General: No swelling.  Neurological:     Mental Status: She is alert and oriented to person, place, and time.      Imaging: Dg Chest 2 View  Result Date: 10/13/2018 CLINICAL DATA:  Recent pneumonia EXAM: CHEST - 2 VIEW COMPARISON:  September 08, 2018 FINDINGS: The right middle lobe opacity has worsened in the interval. The heart, hila, mediastinum, lungs, and pleura are otherwise unchanged and unremarkable. No other acute abnormalities. IMPRESSION: Worsening right middle lobe opacity. Recommend CT imaging for better evaluation. These results will be called to the ordering clinician or representative by the Radiologist Assistant, and communication documented in the PACS or zVision Dashboard. Electronically Signed   By: Dorise Bullion III M.D   On: 10/13/2018 15:04     Assessment & Plan:   Tobacco user Patient admits that she started back smoking 2 months ago. Smoking cessation instruction/counseling given:  counseled patient on the dangers of tobacco use, advised patient to stop smoking, and reviewed strategies to maximize success   Pneumonia of right middle lobe due to infectious organism [facility]) Clinically  improving. Patient still has 2 days of Levaquin to finish and still finishing prednisone taper. She has a follow up appointment scheduled with Dr. Annamaria Boots in 3 weeks. Will order a follow up chest x ray before this visit.  Patient Instructions  Please complete entire course of Levaquin Please complete prednisone taper  Use flutter valve 3 times daily Please quit smoking before next visit!!! Continue Symbicort Continue Proventil as needed Continue O2  Keep already scheduled appointment with Dr. Annamaria Boots - will order follow up chest x ray to be done before next appointment    Chronic respiratory failure with hypoxia ([place]) Continue O2 to keep sats above 88%     Fenton Foy, NP 10/20/2018

## 2018-10-20 NOTE — Assessment & Plan Note (Signed)
**Note De-Identified [place] Obfuscation** Continue O2 to keep sats above 88%

## 2018-10-20 NOTE — Assessment & Plan Note (Signed)
**Note De-Identified [place] Obfuscation** Clinically improving. Patient still has 2 days of Levaquin to finish and still finishing prednisone taper. She has a follow up appointment scheduled with Dr. Annamaria Boots in 3 weeks. Will order a follow up chest x ray before this visit.  Patient Instructions  Please complete entire course of Levaquin Please complete prednisone taper  Use flutter valve 3 times daily Please quit smoking before next visit!!! Continue Symbicort Continue Proventil as needed Continue O2  Keep already scheduled appointment with Dr. Annamaria Boots - will order follow up chest x ray to be done before next appointment

## 2018-10-21 NOTE — Progress Notes (Deleted)
**Note De-Identified [place] Obfuscation** HPI: FU tachycardia. Echocardiogram March 2012 was tech diff and showed ejection fraction 45-50%, mild left ventricular hypertrophy, rate 1 diastolic dysfunction, moderate mitral regurgitation and mild left atrial enlargement. Patient has chronic home O2 dependent COPD.  Last echocardiogram April 2018 showed normal LV function, mild diastolic dysfunction and mild left atrial enlargement.  Chest x-ray January 2020 showed worsening right middle lobe opacity and CT recommended.  Note previous tachycardia felt secondary to COPD and chronic hypoxemia.  Since last seen  Current Outpatient Medications  Medication Sig Dispense Refill  . albuterol (PROVENTIL HFA;VENTOLIN HFA) 108 (90 Base) MCG/ACT inhaler Inhale 2 puffs into the lungs every 6 (six) hours as needed for wheezing or shortness of breath. 1 Inhaler 12  . albuterol (PROVENTIL) (2.5 MG/3ML) 0.083% nebulizer solution Take 3 mLs (2.5 mg total) by nebulization every 4 (four) hours as needed for wheezing or shortness of breath. DX: 496 180 vial 11  . aspirin EC 81 MG tablet Take 81 mg by mouth daily.    Marland Kitchen atorvastatin (LIPITOR) 80 MG tablet TAKE 1 TABLET DAILY 90 tablet 0  . budesonide-formoterol (SYMBICORT) 160-4.5 MCG/ACT inhaler Inhale 2 puffs then rinse mouth, twice daily (Patient taking differently: Inhale 2 puffs into the lungs daily as needed (SOB, wheezing). ) 1 Inhaler 12  . cefdinir (OMNICEF) 300 MG capsule Take 1 capsule (300 mg total) by mouth 2 (two) times daily. 1 po BID 20 capsule 0  . colchicine 0.6 MG tablet Take 0.6 mg by mouth daily.    . famotidine (PEPCID) 20 MG tablet TAKE  (1)  TABLET TWICE A DAY. (Patient taking differently: Take 20 mg by mouth 2 (two) times daily. ) 60 tablet 4  . FLUoxetine (PROZAC) 20 MG tablet Take 1 tablet (20 mg total) by mouth daily. 30 tablet 1  . fluticasone (FLONASE) 50 MCG/ACT nasal spray Place 2 sprays into both nostrils daily. (Patient taking differently: Place 2 sprays into both nostrils  daily as needed for allergies. ) 16 g 6  . folic acid (FOLVITE) 1 MG tablet Take 1 mg by mouth daily.     . hydrALAZINE (APRESOLINE) 10 MG tablet Take 1 tablet (10 mg total) by mouth 2 (two) times daily as needed. 10 tablet 0  . levofloxacin (LEVAQUIN) 750 MG tablet Take 1 tablet (750 mg total) by mouth daily. 7 tablet 0  . lisinopril (PRINIVIL,ZESTRIL) 10 MG tablet Take 1 tablet (10 mg total) by mouth daily. 90 tablet 3  . methotrexate (RHEUMATREX) 2.5 MG tablet Take 20 mg by mouth once a week. Monday    . montelukast (SINGULAIR) 10 MG tablet Take 1 tablet (10 mg total) by mouth daily. 90 tablet 0  . Nebulizers (COMPRESSOR/NEBULIZER) MISC Use up to 4 times daily when needed 1 each 0  . predniSONE (DELTASONE) 10 MG tablet Take 4 tabs for 2 days, then 3 tabs for 2 days, then 2 tabs for 2 days, then 1 tab for 2 days, then stop 20 tablet 0  . Respiratory Therapy Supplies (FLUTTER) DEVI Patient to use device for ten minutes, three times daily 1 each 0   No current facility-administered medications for this visit.      Past Medical History:  Diagnosis Date  . Allergy   . Anxiety disorder   . Arthritis    bil legs  . Asthma   . Cancer ([place])   . Chronic bronchitis   . COPD (chronic obstructive pulmonary disease) ([place])   . Depression   . **Note De-Identified [place] Obfuscation** Diverticulitis   . DJD (degenerative joint disease)    spine  . Fibromyalgia   . GERD (gastroesophageal reflux disease)   . Glaucoma   . History of thyroid cancer   . HTN (hypertension)    no longer on BP medication  . Hyperlipidemia   . Impaired glucose tolerance 05/08/2013  . OSA (obstructive sleep apnea)    CPAP  last sleep study 8-10 yr.ag0  . Polymyalgia ([place])   . Psoriasis   . Rhinitis   . Tobacco abuse     Past Surgical History:  Procedure Laterality Date  . APPENDECTOMY    . CARPAL TUNNEL RELEASE  10/30/2011   Procedure: CARPAL TUNNEL RELEASE;  Surgeon: Wynonia Sours, MD;  Location: [facility];  Service: Orthopedics;   Laterality: Right;  and mass excision  . CHOLECYSTECTOMY    . CHOLESTEATOMA EXCISION     right ear  . left shoulder spurs    . ORIF right lower leg    . partial throidectomy    . TOTAL ABDOMINAL HYSTERECTOMY      Social History   Socioeconomic History  . Marital status: Married    Spouse name: Not on file  . Number of children: 3  . Years of education: 9  . Highest education level: Not on file  Occupational History  . Occupation: disabled    Employer: DISABLED  Social Needs  . Financial resource strain: Not on file  . Food insecurity:    Worry: Not on file    Inability: Not on file  . Transportation needs:    Medical: Not on file    Non-medical: Not on file  Tobacco Use  . Smoking status: Current Every Day Smoker    Packs/day: 1.00    Years: 45.00    Pack years: 45.00    Types: Cigarettes  . Smokeless tobacco: Never Used  Substance and Sexual Activity  . Alcohol use: No  . Drug use: No  . Sexual activity: Not on file  Lifestyle  . Physical activity:    Days per week: Not on file    Minutes per session: Not on file  . Stress: Not on file  Relationships  . Social connections:    Talks on phone: Not on file    Gets together: Not on file    Attends religious service: Not on file    Active member of club or organization: Not on file    Attends meetings of clubs or organizations: Not on file    Relationship status: Not on file  . Intimate partner violence:    Fear of current or ex partner: Not on file    Emotionally abused: Not on file    Physically abused: Not on file    Forced sexual activity: Not on file  Other Topics Concern  . Not on file  Social History Narrative   Married w/ children   Daily caffeine use    Family History  Problem Relation Age of Onset  . Emphysema Sister   . Hypertension Sister   . Heart attack Sister   . Emphysema Sister   . Alpha-1 antitrypsin deficiency Sister   . Alpha-1 antitrypsin deficiency Sister   . Allergies Sister    . Asthma Sister   . Heart disease Mother   . Cancer Mother   . Hyperlipidemia Mother   . Hypertension Mother   . Heart attack Mother   . Heart disease Father   . Heart attack Father   . **Note De-Identified [place]in Obfuscation** Cancer Sister   . Cancer Brother   . Hyperlipidemia Brother   . Hypertension Brother   . Tuberculosis Other        grandmother  . Hyperlipidemia Daughter   . Hypertension Daughter   . Hypertension Son     ROS: no fevers or chills, productive cough, hemoptysis, dysphasia, odynophagia, melena, hematochezia, dysuria, hematuria, rash, seizure activity, orthopnea, PND, pedal edema, claudication. Remaining systems are negative.  Physical Exam: Well-developed well-nourished in no acute distress.  Skin is warm and dry.  HEENT is normal.  Neck is supple.  Chest is clear to auscultation with normal expansion.  Cardiovascular exam is regular rate and rhythm.  Abdominal exam nontender or distended. No masses palpated. Extremities show no edema. neuro grossly intact  ECG- personally reviewed  A/P  1  Kirk Ruths, MD

## 2018-10-24 ENCOUNTER — Ambulatory Visit: Payer: Medicare Other | Admitting: Cardiology

## 2018-10-29 DIAGNOSIS — G4733 Obstructive sleep apnea (adult) (pediatric): Secondary | ICD-10-CM | POA: Diagnosis not present

## 2018-11-03 ENCOUNTER — Ambulatory Visit (INDEPENDENT_AMBULATORY_CARE_PROVIDER_SITE_OTHER): Payer: Medicare Other | Admitting: Family Medicine

## 2018-11-03 ENCOUNTER — Encounter: Payer: Self-pay | Admitting: Family Medicine

## 2018-11-03 ENCOUNTER — Ambulatory Visit (INDEPENDENT_AMBULATORY_CARE_PROVIDER_SITE_OTHER): Payer: Medicare Other

## 2018-11-03 VITALS — BP 138/83 | HR 109 | Temp 98.9°F | Ht 65.0 in | Wt 241.0 lb

## 2018-11-03 DIAGNOSIS — F329 Major depressive disorder, single episode, unspecified: Secondary | ICD-10-CM

## 2018-11-03 DIAGNOSIS — W19XXXA Unspecified fall, initial encounter: Secondary | ICD-10-CM

## 2018-11-03 DIAGNOSIS — G4733 Obstructive sleep apnea (adult) (pediatric): Secondary | ICD-10-CM | POA: Diagnosis not present

## 2018-11-03 DIAGNOSIS — R0781 Pleurodynia: Secondary | ICD-10-CM

## 2018-11-03 DIAGNOSIS — F419 Anxiety disorder, unspecified: Secondary | ICD-10-CM

## 2018-11-03 DIAGNOSIS — J181 Lobar pneumonia, unspecified organism: Secondary | ICD-10-CM | POA: Diagnosis not present

## 2018-11-03 DIAGNOSIS — Z8701 Personal history of pneumonia (recurrent): Secondary | ICD-10-CM

## 2018-11-03 MED ORDER — BENZONATATE 200 MG PO CAPS: 200.0000 mg | ORAL_CAPSULE | Freq: Two times a day (BID) | ORAL | 1 refills | Status: DC | PRN

## 2018-11-03 MED ORDER — HYDROCODONE-ACETAMINOPHEN 5-325 MG PO TABS: 1.0000 | ORAL_TABLET | Freq: Two times a day (BID) | ORAL | 0 refills | Status: DC | PRN

## 2018-11-03 NOTE — Progress Notes (Signed)
**Note De-Identified [place] Obfuscation** Subjective: CC: Altered mentation f/u PCP: [name], DO FUX:NATFTDDUK C Winecoff is a 63 y.o. female presenting to clinic today for:  1. Depression/ Anxiety History: Intolerant to Cymbalta.  Also previously treated with Zoloft.  She is not sure why she stopped that particular medication.  Intolerant to Cymbalta  She reports tolerance of Prozac 20 mg daily.  Her family member is with her who notes that she seems to be dealing with stressors much more easily.  Patient is doing well with the dose and not having any side effects.  2.  Recent pneumonia Patient was treated for right middle lobe pneumonia with Levaquin and steroids by her pulmonologist recently.  There was plans for follow-up and repeat chest x-ray in February.  She has completed her medications.  She notes that she continues to cough but the sputum is now no longer yellow.  Certainly no hemoptysis.  Breathing is baseline.  3.  Fall Patient sustained a fall to the left side on 10/23/2018 (she fell out of bed).  She has had quite a bit of pain on her entire left rib cage particularly within the breast region.  She had some bruising but that has since resolved.  She has not noticed any swelling but does still feel like everything is very tender.  Symptoms are refractory to OTC medications.  ROS: Per HPI  Allergies  Allergen Reactions  . Doxycycline Shortness Of Breath and Other (See Comments)    Drug interaction with Soriatane  . Tramadol Shortness Of Breath    Did not work  . Milnacipran     REACTION: dizzy  . Apremilast Rash    Rash, able to take this currently   Past Medical History:  Diagnosis Date  . Allergy   . Anxiety disorder   . Arthritis    bil legs  . Asthma   . Cancer ([place])   . Chronic bronchitis   . COPD (chronic obstructive pulmonary disease) ([place])   . Depression   . Diverticulitis   . DJD (degenerative joint disease)    spine  . Fibromyalgia   . GERD (gastroesophageal reflux disease)   .  Glaucoma   . History of thyroid cancer   . HTN (hypertension)    no longer on BP medication  . Hyperlipidemia   . Impaired glucose tolerance 05/08/2013  . OSA (obstructive sleep apnea)    CPAP  last sleep study 8-10 yr.ag0  . Polymyalgia ([place])   . Psoriasis   . Rhinitis   . Tobacco abuse     Current Outpatient Medications:  .  albuterol (PROVENTIL HFA;VENTOLIN HFA) 108 (90 Base) MCG/ACT inhaler, Inhale 2 puffs into the lungs every 6 (six) hours as needed for wheezing or shortness of breath., Disp: 1 Inhaler, Rfl: 12 .  albuterol (PROVENTIL) (2.5 MG/3ML) 0.083% nebulizer solution, Take 3 mLs (2.5 mg total) by nebulization every 4 (four) hours as needed for wheezing or shortness of breath. DX: 496, Disp: 180 vial, Rfl: 11 .  aspirin EC 81 MG tablet, Take 81 mg by mouth daily., Disp: , Rfl:  .  atorvastatin (LIPITOR) 80 MG tablet, TAKE 1 TABLET DAILY, Disp: 90 tablet, Rfl: 0 .  budesonide-formoterol (SYMBICORT) 160-4.5 MCG/ACT inhaler, Inhale 2 puffs then rinse mouth, twice daily (Patient taking differently: Inhale 2 puffs into the lungs daily as needed (SOB, wheezing). ), Disp: 1 Inhaler, Rfl: 12 .  colchicine 0.6 MG tablet, Take 0.6 mg by mouth daily., Disp: , Rfl:  .  famotidine ( **Note De-identified Calleros Obfuscation** PEPCID) 20 MG tablet, TAKE  (1)  TABLET TWICE A DAY. (Patient taking differently: Take 20 mg by mouth 2 (two) times daily. ), Disp: 60 tablet, Rfl: 4 .  FLUoxetine (PROZAC) 20 MG tablet, Take 1 tablet (20 mg total) by mouth daily., Disp: 30 tablet, Rfl: 1 .  fluticasone (FLONASE) 50 MCG/ACT nasal spray, Place 2 sprays into both nostrils daily. (Patient taking differently: Place 2 sprays into both nostrils daily as needed for allergies. ), Disp: 16 g, Rfl: 6 .  folic acid (FOLVITE) 1 MG tablet, Take 1 mg by mouth daily. , Disp: , Rfl:  .  hydrALAZINE (APRESOLINE) 10 MG tablet, Take 1 tablet (10 mg total) by mouth 2 (two) times daily as needed., Disp: 10 tablet, Rfl: 0 .  levofloxacin (LEVAQUIN) 750 MG tablet, Take 1  tablet (750 mg total) by mouth daily., Disp: 7 tablet, Rfl: 0 .  lisinopril (PRINIVIL,ZESTRIL) 10 MG tablet, Take 1 tablet (10 mg total) by mouth daily., Disp: 90 tablet, Rfl: 3 .  methotrexate (RHEUMATREX) 2.5 MG tablet, Take 20 mg by mouth once a week. Monday, Disp: , Rfl:  .  montelukast (SINGULAIR) 10 MG tablet, Take 1 tablet (10 mg total) by mouth daily., Disp: 90 tablet, Rfl: 0 .  Nebulizers (COMPRESSOR/NEBULIZER) MISC, Use up to 4 times daily when needed, Disp: 1 each, Rfl: 0 .  predniSONE (DELTASONE) 10 MG tablet, Take 4 tabs for 2 days, then 3 tabs for 2 days, then 2 tabs for 2 days, then 1 tab for 2 days, then stop, Disp: 20 tablet, Rfl: 0 .  Respiratory Therapy Supplies (FLUTTER) DEVI, Patient to use device for ten minutes, three times daily, Disp: 1 each, Rfl: 0 Social History   Socioeconomic History  . Marital status: Married    Spouse name: Not on file  . Number of children: 3  . Years of education: 9  . Highest education level: Not on file  Occupational History  . Occupation: disabled    Employer: DISABLED  Social Needs  . Financial resource strain: Not on file  . Food insecurity:    Worry: Not on file    Inability: Not on file  . Transportation needs:    Medical: Not on file    Non-medical: Not on file  Tobacco Use  . Smoking status: Current Every Day Smoker    Packs/day: 1.00    Years: 45.00    Pack years: 45.00    Types: Cigarettes  . Smokeless tobacco: Never Used  Substance and Sexual Activity  . Alcohol use: No  . Drug use: No  . Sexual activity: Not on file  Lifestyle  . Physical activity:    Days per week: Not on file    Minutes per session: Not on file  . Stress: Not on file  Relationships  . Social connections:    Talks on phone: Not on file    Gets together: Not on file    Attends religious service: Not on file    Active member of club or organization: Not on file    Attends meetings of clubs or organizations: Not on file    Relationship  status: Not on file  . Intimate partner violence:    Fear of current or ex partner: Not on file    Emotionally abused: Not on file    Physically abused: Not on file    Forced sexual activity: Not on file  Other Topics Concern  . Not on file  Social History  **Note De-Identified [place] Obfuscation** Narrative   Married w/ children   Daily caffeine use   Family History  Problem Relation Age of Onset  . Emphysema Sister   . Hypertension Sister   . Heart attack Sister   . Emphysema Sister   . Alpha-1 antitrypsin deficiency Sister   . Alpha-1 antitrypsin deficiency Sister   . Allergies Sister   . Asthma Sister   . Heart disease Mother   . Cancer Mother   . Hyperlipidemia Mother   . Hypertension Mother   . Heart attack Mother   . Heart disease Father   . Heart attack Father   . Cancer Sister   . Cancer Brother   . Hyperlipidemia Brother   . Hypertension Brother   . Tuberculosis Other        grandmother  . Hyperlipidemia Daughter   . Hypertension Daughter   . Hypertension Son     Objective: Office vital signs reviewed. BP 138/83   Pulse (!) 109   Temp 98.9 F (37.2 C) (Oral)   Ht 5' 5" (1.651 m)   Wt 241 lb (109.3 kg)   BMI 40.10 kg/m   Physical Examination:  General: Awake, alert, chronically ill appearing. No acute distress Cardio: sinus tachycardia, S1S2 heard, no murmurs appreciated Pulm: Globally decreased breath sounds with no wheezes, rhonchi or rales; normal work of breathing on 3 L of home oxygen Ribs: She has exquisite tenderness to palpation out of proportion to exam along the anterior left chest wall extending from the mid chest down to the lower rib cage.  There are no palpable bony abnormalities.  No appreciable bruising or swelling. Psych: Mood stable.  Affect appropriate.  Good eye contact.  Thought process normal.   Depression screen [facility] 2/9 11/03/2018 09/08/2018 08/08/2018  Decreased Interest 1 0 0  Down, Depressed, Hopeless 0 0 0  PHQ - 2 Score 1 0 0  Altered sleeping 1 3 0  Tired,  decreased energy 1 3 0  Change in appetite 0 0 0  Feeling bad or failure about yourself  0 0 0  Trouble concentrating 0 0 0  Moving slowly or fidgety/restless 0 0 0  Suicidal thoughts 0 0 0  PHQ-9 Score 3 6 0  Difficult doing work/chores Not difficult at all Not difficult at all Not difficult at all  Some recent data might be hidden   GAD 7 : Generalized Anxiety Score 11/03/2018 09/08/2018 08/08/2018 09/21/2016  Nervous, Anxious, on Edge 2 0 0 3  Control/stop worrying _0 Worry too much - different things _1 Trouble relaxing _2 Restless 1 0 0 0  Easily annoyed or irritable 0 0 3 3  Afraid - awful might happen 1 0 0 3  Total GAD 7 Score _3 Anxiety Difficulty Not difficult at all - Not difficult at all Not difficult at all   Dg Chest 2 View  Result Date: 11/03/2018 CLINICAL DATA:  Left rib pain. Follow-up pneumonia. EXAM: CHEST - 2 VIEW COMPARISON:  10/13/2018. FINDINGS: Significantly decreased right middle lobe opacity. Mild linear atelectasis at the left lung base. Stable borderline enlarged cardiac silhouette. Mild diffuse peribronchial thickening with progression. No rib fracture or pneumothorax seen. Stable small calcified loose bodies in the right cortical V/Q region. IMPRESSION: 1. Significantly improved right middle lobe pneumonia. 2. Mild left basilar atelectasis. 3. Mild bronchitic changes with progression. Electronically Signed   By: Claudie Revering M.D.   On: **Note De-Identified [place] Obfuscation** 11/03/2018 15:43    Assessment/ Plan: 63 y.o. female   1. Anxiety Under good control per her report with Prozac though PHQ 9 and gad 7 are essentially unchanged from previous check.  Continue Prozac 20 mg daily - CMP14+EGFR  2. Reactive depression As above - CMP14+EGFR  3. Fall, initial encounter I suspect contusion or ribs.  Personal view of the chest x-ray did not demonstrate any fractures.  Radiologist review also notes no fractures.  Interval improvement of right sided pneumonia noted.  I  offered rx for medical bed but patient declined. - DG Chest 2 View; Future  4. Rib pain on left side Given exquisite pain and splinting, I have prescribed her a short course of Norco to have on hand for severe pain.  NSAID considered but patient with impaired renal function on last BMP.  We will recheck this today.  We discussed the risks of the medication and I advised her to use this medication sparingly.  Check liver function and CBC as below.  We discussed use of ice and a pillow if needed to offset coughing spells.  I have also prescribed her Tessalon Perles.  Continue pulmonary toilet/breathing exercises as recommended by pulmonology. - DG Chest 2 View; Future - CMP14+EGFR  5. History of recent pneumonia Follow-up with pulmonology as recommended.  Interval improvement of right-sided pneumonia noted. - DG Chest 2 View; Future - CBC - CMP14+EGFR  The Narcotic Database has been reviewed.  There were no red flags.    Orders Placed This Encounter  Procedures  . DG Chest 2 View    Standing Status:   Future    Number of Occurrences:   1    Standing Expiration Date:   01/02/2020    Order Specific Question:   Reason for Exam (SYMPTOM  OR DIAGNOSIS REQUIRED)    Answer:   fall left side rib pain. recent pna    Order Specific Question:   Preferred imaging location?    Answer:   Internal    Order Specific Question:   Radiology Contrast Protocol - do NOT remove file path    Answer:   _0 [email or web address]  . CBC  . CMP14+EGFR   Meds ordered this encounter  Medications  . HYDROcodone-acetaminophen (NORCO) 5-325 MG tablet    Sig: Take 1 tablet by mouth 2 (two) times daily as needed for severe pain.    Dispense:  10 tablet    Refill:  0  . benzonatate (TESSALON) 200 MG capsule    Sig: Take 1 capsule (200 mg total) by mouth 2 (two) times daily as needed for cough.    Dispense:  20 capsule    Refill:  [place], [place] [phone]

## 2018-11-03 NOTE — Patient Instructions (Signed)
**Note De-Identified [place] Obfuscation** You had labs performed today.  You will be contacted with the results of the labs once they are available, usually in the next 3 business days for routine lab work.  If you had a pap smear or biopsy performed, expect to be contacted in about 7-10 days.  Take the hydrocodone with EXTREME caution and ONLY if needed for severe pain.  Use the cough pills to reduce coughing spells.  Continue breathing exercises.  Use pillow on ribs if you need to cough/ sneeze.  Keep follow up with the lung specialist.  Contusion  A contusion is a deep bruise. Contusions are the result of a blunt injury to tissues and muscle fibers under the skin. The injury causes bleeding under the skin. The skin overlying the contusion may turn blue, purple, or yellow. Minor injuries will give you a painless contusion, but more severe contusions may stay painful and swollen for a few weeks. What are the causes? This condition is usually caused by a blow, trauma, or direct force to an area of the body. What are the signs or symptoms? Symptoms of this condition include:  Swelling of the injured area.  Pain and tenderness in the injured area.  Discoloration. The area may have redness and then turn blue, purple, or yellow. How is this diagnosed? This condition is diagnosed based on a physical exam and medical history. An X-ray, CT scan, or MRI may be needed to determine if there are any associated injuries, such as broken bones (fractures). How is this treated? Specific treatment for this condition depends on what area of the body was injured. In general, the best treatment for a contusion is resting, icing, applying pressure to (compression), and elevating the injured area. This is often called the RICE strategy. Over-the-counter anti-inflammatory medicines may also be recommended for pain control. Follow these instructions at home:  Rest the injured area.  If directed, apply ice to the injured area: ? Put ice in a plastic  bag. ? Place a towel between your skin and the bag. ? Leave the ice on for 20 minutes, 2-3 times per day.  If directed, apply light compression to the injured area using an elastic bandage. Make sure the bandage is not wrapped too tightly. Remove and reapply the bandage as directed by your health care provider.  If possible, raise (elevate) the injured area above the level of your heart while you are sitting or lying down.  Take over-the-counter and prescription medicines only as told by your health care provider. Contact a health care provider if:  Your symptoms do not improve after several days of treatment.  Your symptoms get worse.  You have difficulty moving the injured area. Get help right away if:  You have severe pain.  You have numbness in a hand or foot.  Your hand or foot turns pale or cold. This information is not intended to replace advice given to you by your health care provider. Make sure you discuss any questions you have with your health care provider. Document Released: 06/27/2005 Document Revised: 01/26/2016 Document Reviewed: 02/02/2015 Elsevier Interactive Patient Education  2019 [employer].

## 2018-11-04 LAB — CMP14+EGFR
ALT: 28 IU/L (ref 0–32)
AST: 27 IU/L (ref 0–40)
Albumin/Globulin Ratio: 1.6 (ref 1.2–2.2)
Albumin: 4.3 g/dL (ref 3.8–4.8)
Alkaline Phosphatase: 134 IU/L — ABNORMAL HIGH (ref 39–117)
BUN/Creatinine Ratio: 16 (ref 12–28)
BUN: 16 mg/dL (ref 8–27)
Bilirubin Total: 0.5 mg/dL (ref 0.0–1.2)
CO2: 27 mmol/L (ref 20–29)
Calcium: 9.3 mg/dL (ref 8.7–10.3)
Chloride: 97 mmol/L (ref 96–106)
Creatinine, Ser: 0.97 mg/dL (ref 0.57–1.00)
GFR calc Af Amer: 71 mL/min/{1.73_m2} (ref >59)
GFR calc non Af Amer: 62 mL/min/{1.73_m2} (ref >59)
GLOBULIN, TOTAL: 2.7 g/dL (ref 1.5–4.5)
Glucose: 79 mg/dL (ref 65–99)
Potassium: 4.6 mmol/L (ref 3.5–5.2)
SODIUM: 141 mmol/L (ref 134–144)
Total Protein: 7 g/dL (ref 6.0–8.5)

## 2018-11-04 LAB — CBC
Hematocrit: 42.9 % (ref 34.0–46.6)
Hemoglobin: 14.6 g/dL (ref 11.1–15.9)
MCH: 33.6 pg — ABNORMAL HIGH (ref 26.6–33.0)
MCHC: 34 g/dL (ref 31.5–35.7)
MCV: 99 fL — ABNORMAL HIGH (ref 79–97)
Platelets: 116 10*3/uL — ABNORMAL LOW (ref 150–450)
RBC: 4.34 x10E6/uL (ref 3.77–5.28)
RDW: 15 % (ref 11.7–15.4)
WBC: 9.6 10*3/uL (ref 3.4–10.8)

## 2018-11-11 ENCOUNTER — Other Ambulatory Visit: Payer: Self-pay | Admitting: Family Medicine

## 2018-11-11 DIAGNOSIS — T85511A Breakdown (mechanical) of esophageal anti-reflux device, initial encounter: Secondary | ICD-10-CM | POA: Diagnosis not present

## 2018-11-11 DIAGNOSIS — G4733 Obstructive sleep apnea (adult) (pediatric): Secondary | ICD-10-CM | POA: Diagnosis not present

## 2018-11-11 DIAGNOSIS — J449 Chronic obstructive pulmonary disease, unspecified: Secondary | ICD-10-CM | POA: Diagnosis not present

## 2018-11-11 DIAGNOSIS — L400 Psoriasis vulgaris: Secondary | ICD-10-CM | POA: Diagnosis not present

## 2018-11-14 ENCOUNTER — Ambulatory Visit: Payer: Medicare Other | Admitting: Internal Medicine

## 2018-11-21 ENCOUNTER — Ambulatory Visit: Payer: Medicare Other | Admitting: Internal Medicine

## 2018-12-01 ENCOUNTER — Other Ambulatory Visit: Payer: Self-pay | Admitting: Pediatrics

## 2018-12-02 DIAGNOSIS — G4733 Obstructive sleep apnea (adult) (pediatric): Secondary | ICD-10-CM | POA: Diagnosis not present

## 2018-12-10 DIAGNOSIS — T85511A Breakdown (mechanical) of esophageal anti-reflux device, initial encounter: Secondary | ICD-10-CM | POA: Diagnosis not present

## 2018-12-10 DIAGNOSIS — G4733 Obstructive sleep apnea (adult) (pediatric): Secondary | ICD-10-CM | POA: Diagnosis not present

## 2018-12-10 DIAGNOSIS — L400 Psoriasis vulgaris: Secondary | ICD-10-CM | POA: Diagnosis not present

## 2018-12-10 DIAGNOSIS — J449 Chronic obstructive pulmonary disease, unspecified: Secondary | ICD-10-CM | POA: Diagnosis not present

## 2019-01-02 DIAGNOSIS — G4733 Obstructive sleep apnea (adult) (pediatric): Secondary | ICD-10-CM | POA: Diagnosis not present

## 2019-01-10 DIAGNOSIS — L400 Psoriasis vulgaris: Secondary | ICD-10-CM | POA: Diagnosis not present

## 2019-01-10 DIAGNOSIS — G4733 Obstructive sleep apnea (adult) (pediatric): Secondary | ICD-10-CM | POA: Diagnosis not present

## 2019-01-10 DIAGNOSIS — T85511A Breakdown (mechanical) of esophageal anti-reflux device, initial encounter: Secondary | ICD-10-CM | POA: Diagnosis not present

## 2019-01-10 DIAGNOSIS — J449 Chronic obstructive pulmonary disease, unspecified: Secondary | ICD-10-CM | POA: Diagnosis not present

## 2019-01-14 ENCOUNTER — Other Ambulatory Visit: Payer: Self-pay | Admitting: Family Medicine

## 2019-01-22 ENCOUNTER — Ambulatory Visit: Payer: Medicare Other | Admitting: Internal Medicine

## 2019-02-09 DIAGNOSIS — J449 Chronic obstructive pulmonary disease, unspecified: Secondary | ICD-10-CM | POA: Diagnosis not present

## 2019-02-09 DIAGNOSIS — L400 Psoriasis vulgaris: Secondary | ICD-10-CM | POA: Diagnosis not present

## 2019-02-09 DIAGNOSIS — T85511A Breakdown (mechanical) of esophageal anti-reflux device, initial encounter: Secondary | ICD-10-CM | POA: Diagnosis not present

## 2019-02-09 DIAGNOSIS — G4733 Obstructive sleep apnea (adult) (pediatric): Secondary | ICD-10-CM | POA: Diagnosis not present

## 2019-02-10 ENCOUNTER — Other Ambulatory Visit: Payer: Self-pay | Admitting: Family Medicine

## 2019-02-10 ENCOUNTER — Encounter

## 2019-02-26 DIAGNOSIS — G4733 Obstructive sleep apnea (adult) (pediatric): Secondary | ICD-10-CM | POA: Diagnosis not present

## 2019-03-04 ENCOUNTER — Other Ambulatory Visit: Payer: Self-pay | Admitting: Family Medicine

## 2019-03-12 DIAGNOSIS — T85511A Breakdown (mechanical) of esophageal anti-reflux device, initial encounter: Secondary | ICD-10-CM | POA: Diagnosis not present

## 2019-03-12 DIAGNOSIS — J449 Chronic obstructive pulmonary disease, unspecified: Secondary | ICD-10-CM | POA: Diagnosis not present

## 2019-03-12 DIAGNOSIS — L400 Psoriasis vulgaris: Secondary | ICD-10-CM | POA: Diagnosis not present

## 2019-03-12 DIAGNOSIS — G4733 Obstructive sleep apnea (adult) (pediatric): Secondary | ICD-10-CM | POA: Diagnosis not present

## 2019-03-18 ENCOUNTER — Other Ambulatory Visit: Payer: Self-pay | Admitting: Family Medicine

## 2019-04-11 DIAGNOSIS — L400 Psoriasis vulgaris: Secondary | ICD-10-CM | POA: Diagnosis not present

## 2019-04-11 DIAGNOSIS — T85511A Breakdown (mechanical) of esophageal anti-reflux device, initial encounter: Secondary | ICD-10-CM | POA: Diagnosis not present

## 2019-04-11 DIAGNOSIS — G4733 Obstructive sleep apnea (adult) (pediatric): Secondary | ICD-10-CM | POA: Diagnosis not present

## 2019-04-11 DIAGNOSIS — J449 Chronic obstructive pulmonary disease, unspecified: Secondary | ICD-10-CM | POA: Diagnosis not present

## 2019-04-23 ENCOUNTER — Other Ambulatory Visit: Payer: Self-pay | Admitting: Family Medicine

## 2019-04-24 NOTE — Telephone Encounter (Signed)
**Note De-Identified Hall Obfuscation** [place]. NTBS 30 days given 03/18/19

## 2019-04-24 NOTE — Telephone Encounter (Signed)
**Note De-identified [place]zman Obfuscation** Appointment scheduled.

## 2019-05-07 ENCOUNTER — Other Ambulatory Visit: Payer: Self-pay

## 2019-05-08 ENCOUNTER — Encounter: Payer: Self-pay | Admitting: Family Medicine

## 2019-05-08 ENCOUNTER — Ambulatory Visit (INDEPENDENT_AMBULATORY_CARE_PROVIDER_SITE_OTHER): Payer: Medicare Other | Admitting: Family Medicine

## 2019-05-08 VITALS — BP 139/79 | HR 108 | Temp 98.2°F | Ht 65.0 in | Wt 240.0 lb

## 2019-05-08 DIAGNOSIS — J9611 Chronic respiratory failure with hypoxia: Secondary | ICD-10-CM

## 2019-05-08 DIAGNOSIS — F329 Major depressive disorder, single episode, unspecified: Secondary | ICD-10-CM

## 2019-05-08 DIAGNOSIS — J410 Simple chronic bronchitis: Secondary | ICD-10-CM

## 2019-05-08 DIAGNOSIS — I10 Essential (primary) hypertension: Secondary | ICD-10-CM

## 2019-05-08 DIAGNOSIS — F419 Anxiety disorder, unspecified: Secondary | ICD-10-CM | POA: Diagnosis not present

## 2019-05-08 LAB — BASIC METABOLIC PANEL
BUN/Creatinine Ratio: 19 (ref 12–28)
BUN: 21 mg/dL (ref 8–27)
CO2: 28 mmol/L (ref 20–29)
Calcium: 8.9 mg/dL (ref 8.7–10.3)
Chloride: 96 mmol/L (ref 96–106)
Creatinine, Ser: 1.11 mg/dL — ABNORMAL HIGH (ref 0.57–1.00)
GFR calc Af Amer: 60 mL/min/{1.73_m2} (ref >59)
GFR calc non Af Amer: 52 mL/min/{1.73_m2} — ABNORMAL LOW (ref >59)
Glucose: 143 mg/dL — ABNORMAL HIGH (ref 65–99)
Potassium: 4.6 mmol/L (ref 3.5–5.2)
Sodium: 139 mmol/L (ref 134–144)

## 2019-05-08 MED ORDER — LISINOPRIL 10 MG PO TABS: 10.0000 mg | ORAL_TABLET | Freq: Every day | ORAL | 3 refills | Status: DC

## 2019-05-08 MED ORDER — FLUOXETINE HCL 20 MG PO CAPS: 20.0000 mg | ORAL_CAPSULE | Freq: Every day | ORAL | 3 refills | Status: DC

## 2019-05-08 MED ORDER — MONTELUKAST SODIUM 10 MG PO TABS: 10.0000 mg | ORAL_TABLET | Freq: Every day | ORAL | 3 refills | Status: DC

## 2019-05-08 MED ORDER — BUDESONIDE-FORMOTEROL FUMARATE 160-4.5 MCG/ACT IN AERO: INHALATION_SPRAY | RESPIRATORY_TRACT | 12 refills | Status: DC

## 2019-05-08 MED ORDER — COLCHICINE 0.6 MG PO TABS: 0.6000 mg | ORAL_TABLET | Freq: Every day | ORAL | 3 refills | Status: DC

## 2019-05-08 MED ORDER — ATORVASTATIN CALCIUM 80 MG PO TABS: 80.0000 mg | ORAL_TABLET | Freq: Every day | ORAL | 3 refills | Status: DC

## 2019-05-08 NOTE — Patient Instructions (Signed)
**Note De-Identified [place] Obfuscation** All meds have been refilled.  See me in 6 months unless you need me sooner.  Flu shots will be available in October.  Call for a nurses visit to get one.  You had labs performed today.  You will be contacted with the results of the labs once they are available, usually in the next 3 business days for routine lab work.  If you have an active my chart account, they will be released to your MyChart.  If you prefer to have these labs released to you Ly telephone, please let us know.  If you had a pap smear or biopsy performed, expect to be contacted in about 7-10 days.
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
## 2019-05-11 ENCOUNTER — Telehealth: Payer: Self-pay | Admitting: Family Medicine

## 2019-05-11 NOTE — Telephone Encounter (Signed)
**Note De-Identified Janish Obfuscation** Patient aware and will also contact urologist

## 2019-05-12 ENCOUNTER — Other Ambulatory Visit: Payer: Self-pay | Admitting: Family Medicine

## 2019-05-12 DIAGNOSIS — T85511A Breakdown (mechanical) of esophageal anti-reflux device, initial encounter: Secondary | ICD-10-CM | POA: Diagnosis not present

## 2019-05-12 DIAGNOSIS — L400 Psoriasis vulgaris: Secondary | ICD-10-CM | POA: Diagnosis not present

## 2019-05-12 DIAGNOSIS — G4733 Obstructive sleep apnea (adult) (pediatric): Secondary | ICD-10-CM | POA: Diagnosis not present

## 2019-05-12 DIAGNOSIS — Z1231 Encounter for screening mammogram for malignant neoplasm of breast: Secondary | ICD-10-CM

## 2019-05-12 DIAGNOSIS — J449 Chronic obstructive pulmonary disease, unspecified: Secondary | ICD-10-CM | POA: Diagnosis not present

## 2019-06-12 DIAGNOSIS — G4733 Obstructive sleep apnea (adult) (pediatric): Secondary | ICD-10-CM | POA: Diagnosis not present

## 2019-06-12 DIAGNOSIS — T85511A Breakdown (mechanical) of esophageal anti-reflux device, initial encounter: Secondary | ICD-10-CM | POA: Diagnosis not present

## 2019-06-12 DIAGNOSIS — L400 Psoriasis vulgaris: Secondary | ICD-10-CM | POA: Diagnosis not present

## 2019-06-12 DIAGNOSIS — J449 Chronic obstructive pulmonary disease, unspecified: Secondary | ICD-10-CM | POA: Diagnosis not present

## 2019-06-26 ENCOUNTER — Ambulatory Visit: Payer: Medicare Other

## 2019-07-07 DIAGNOSIS — G4733 Obstructive sleep apnea (adult) (pediatric): Secondary | ICD-10-CM | POA: Diagnosis not present

## 2019-07-10 DIAGNOSIS — N1831 Chronic kidney disease, stage 3a: Secondary | ICD-10-CM | POA: Diagnosis not present

## 2019-07-10 DIAGNOSIS — R809 Proteinuria, unspecified: Secondary | ICD-10-CM | POA: Diagnosis not present

## 2019-07-10 DIAGNOSIS — I129 Hypertensive chronic kidney disease with stage 1 through stage 4 chronic kidney disease, or unspecified chronic kidney disease: Secondary | ICD-10-CM | POA: Diagnosis not present

## 2019-07-10 DIAGNOSIS — D631 Anemia in chronic kidney disease: Secondary | ICD-10-CM | POA: Diagnosis not present

## 2019-07-10 DIAGNOSIS — N189 Chronic kidney disease, unspecified: Secondary | ICD-10-CM | POA: Diagnosis not present

## 2019-07-10 DIAGNOSIS — N2581 Secondary hyperparathyroidism of renal origin: Secondary | ICD-10-CM | POA: Diagnosis not present

## 2019-07-12 DIAGNOSIS — G4733 Obstructive sleep apnea (adult) (pediatric): Secondary | ICD-10-CM | POA: Diagnosis not present

## 2019-07-12 DIAGNOSIS — J449 Chronic obstructive pulmonary disease, unspecified: Secondary | ICD-10-CM | POA: Diagnosis not present

## 2019-07-12 DIAGNOSIS — L400 Psoriasis vulgaris: Secondary | ICD-10-CM | POA: Diagnosis not present

## 2019-07-12 DIAGNOSIS — T85511A Breakdown (mechanical) of esophageal anti-reflux device, initial encounter: Secondary | ICD-10-CM | POA: Diagnosis not present

## 2019-07-16 ENCOUNTER — Other Ambulatory Visit: Payer: Self-pay | Admitting: Family Medicine

## 2019-07-16 ENCOUNTER — Encounter: Payer: Self-pay | Admitting: Family Medicine

## 2019-07-17 DIAGNOSIS — L409 Psoriasis, unspecified: Secondary | ICD-10-CM | POA: Diagnosis not present

## 2019-07-17 DIAGNOSIS — Z5181 Encounter for therapeutic drug level monitoring: Secondary | ICD-10-CM | POA: Diagnosis not present

## 2019-07-24 DIAGNOSIS — Z5181 Encounter for therapeutic drug level monitoring: Secondary | ICD-10-CM | POA: Diagnosis not present

## 2019-07-24 DIAGNOSIS — L409 Psoriasis, unspecified: Secondary | ICD-10-CM | POA: Diagnosis not present

## 2019-08-07 ENCOUNTER — Ambulatory Visit: Payer: Medicare Other

## 2019-08-12 ENCOUNTER — Other Ambulatory Visit: Payer: Self-pay

## 2019-08-12 DIAGNOSIS — L400 Psoriasis vulgaris: Secondary | ICD-10-CM | POA: Diagnosis not present

## 2019-08-12 DIAGNOSIS — J449 Chronic obstructive pulmonary disease, unspecified: Secondary | ICD-10-CM | POA: Diagnosis not present

## 2019-08-12 DIAGNOSIS — G4733 Obstructive sleep apnea (adult) (pediatric): Secondary | ICD-10-CM | POA: Diagnosis not present

## 2019-08-12 DIAGNOSIS — T85511A Breakdown (mechanical) of esophageal anti-reflux device, initial encounter: Secondary | ICD-10-CM | POA: Diagnosis not present

## 2019-08-13 ENCOUNTER — Ambulatory Visit (INDEPENDENT_AMBULATORY_CARE_PROVIDER_SITE_OTHER): Payer: Medicare Other | Admitting: Nurse Practitioner

## 2019-08-13 ENCOUNTER — Encounter: Payer: Self-pay | Admitting: Nurse Practitioner

## 2019-08-13 VITALS — BP 179/100 | HR 93 | Temp 97.3°F | Ht 65.0 in | Wt 230.0 lb

## 2019-08-13 DIAGNOSIS — I10 Essential (primary) hypertension: Secondary | ICD-10-CM

## 2019-08-13 DIAGNOSIS — S81812A Laceration without foreign body, left lower leg, initial encounter: Secondary | ICD-10-CM | POA: Diagnosis not present

## 2019-08-13 MED ORDER — LISINOPRIL 20 MG PO TABS: 20.0000 mg | ORAL_TABLET | Freq: Every day | ORAL | 3 refills | Status: DC

## 2019-08-13 MED ORDER — CEPHALEXIN 500 MG PO CAPS: 500.0000 mg | ORAL_CAPSULE | Freq: Four times a day (QID) | ORAL | 0 refills | Status: DC

## 2019-08-13 NOTE — Progress Notes (Signed)
**Note De-identified [place] Obfuscation**   **Note De-Identified [place] Obfuscation** Subjective:    Patient ID: Hailey Hernandez, female    DOB: 10/20/1955, 63 y.o.   MRN: [id number]   Chief Complaint: Laceration (left leg x 1 week )   HPI Patient comes in c/o laceration to her left lower leg. Something from home felll off a shelf and lacerated her lest shin. It happened over a week ago.   * she went to see kidney specialist 2 weeks ago and they told her to stop her lisinopril because of her kidneys. Lab Results  Component Value Date   CREATININE 1.11 (H) 05/08/2019    Review of Systems  Constitutional: Negative for activity change and appetite change.  HENT: Negative.   Eyes: Negative for pain.  Respiratory: Negative for shortness of breath.   Cardiovascular: Negative for chest pain, palpitations and leg swelling.  Gastrointestinal: Negative for abdominal pain.  Endocrine: Negative for polydipsia.  Genitourinary: Negative.   Skin: Negative for rash.  Neurological: Negative for dizziness, weakness and headaches.  Hematological: Does not bruise/bleed easily.  Psychiatric/Behavioral: Negative.   All other systems reviewed and are negative.      Objective:   Physical Exam Vitals signs and nursing note reviewed.  Constitutional:      Appearance: Normal appearance.  Cardiovascular:     Rate and Rhythm: Normal rate and regular rhythm.     Heart sounds: Normal heart sounds.  Pulmonary:     Breath sounds: Normal breath sounds.  Skin:    General: Skin is warm and dry.     Comments: superficial skin laceration to leftt lower shin. 8cm annular with mild surrounding erythema.  Neurological:     General: No focal deficit present.     Mental Status: She is alert.  Psychiatric:        Mood and Affect: Mood normal.        Behavior: Behavior normal.    BP (!) 179/100   Pulse 93   Temp (!) 97.3 F (36.3 C) (Oral)   Ht 5\' 5"  (1.651 m)   Wt 230 lb (104.3 kg)   SpO2 95%   BMI 38.27 kg/m         Assessment & Plan:  Dietrich C Presswood in today with chief  complaint of Laceration (left leg x 1 week )   1. Essential hypertension Low sodium diet - lisinopril (ZESTRIL) 20 MG tablet; Take 1 tablet (20 mg total) by mouth daily.  Dispense: 90 tablet; Refill: 3  2. Laceration of left lower extremity, initial encounter Keep clean and dry Clean with antibacterial soap BID Keep covered - cephALEXin (KEFLEX) 500 MG capsule; Take 1 capsule (500 mg total) by mouth 4 (four) times daily.  Dispense: 20 capsule; Refill: 0  Mary-Margaret Hassell Done, FNP

## 2019-08-13 NOTE — Patient Instructions (Signed)
**Note De-identified Brigante Obfuscation** DASH Eating Plan °DASH stands for "Dietary Approaches to Stop Hypertension." The DASH eating plan is a healthy eating plan that has been shown to reduce high blood pressure (hypertension). It may also reduce your risk for type 2 diabetes, heart disease, and stroke. The DASH eating plan may also help with weight loss. °What are tips for following this plan? ° °General guidelines °· Avoid eating more than 2,300 mg (milligrams) of salt (sodium) a day. If you have hypertension, you may need to reduce your sodium intake to 1,500 mg a day. °· Limit alcohol intake to no more than 1 drink a day for nonpregnant women and 2 drinks a day for men. One drink equals 12 oz of beer, 5 oz of wine, or 1½ oz of hard liquor. °· Work with your health care provider to maintain a healthy body weight or to lose weight. Ask what an ideal weight is for you. °· Get at least 30 minutes of exercise that causes your heart to beat faster (aerobic exercise) most days of the week. Activities may include walking, swimming, or biking. °· Work with your health care provider or diet and nutrition specialist (dietitian) to adjust your eating plan to your individual calorie needs. °Reading food labels ° °· Check food labels for the amount of sodium per serving. Choose foods with less than 5 percent of the Daily Value of sodium. Generally, foods with less than 300 mg of sodium per serving fit into this eating plan. °· To find whole grains, look for the word "whole" as the first word in the ingredient list. °Shopping °· Buy products labeled as "low-sodium" or "no salt added." °· Buy fresh foods. Avoid canned foods and premade or frozen meals. °Cooking °· Avoid adding salt when cooking. Use salt-free seasonings or herbs instead of table salt or sea salt. Check with your health care provider or pharmacist before using salt substitutes. °· Do not fry foods. Cook foods using healthy methods such as baking, boiling, grilling, and broiling instead. °· Cook with  heart-healthy oils, such as olive, canola, soybean, or sunflower oil. °Meal planning °· Eat a balanced diet that includes: °? 5 or more servings of fruits and vegetables each day. At each meal, try to fill half of your plate with fruits and vegetables. °? Up to 6-8 servings of whole grains each day. °? Less than 6 oz of lean meat, poultry, or fish each day. A 3-oz serving of meat is about the same size as a deck of cards. One egg equals 1 oz. °? 2 servings of low-fat dairy each day. °? A serving of nuts, seeds, or beans 5 times each week. °? Heart-healthy fats. Healthy fats called Omega-3 fatty acids are found in foods such as flaxseeds and coldwater fish, like sardines, salmon, and mackerel. °· Limit how much you eat of the following: °? Canned or prepackaged foods. °? Food that is high in trans fat, such as fried foods. °? Food that is high in saturated fat, such as fatty meat. °? Sweets, desserts, sugary drinks, and other foods with added sugar. °? Full-fat dairy products. °· Do not salt foods before eating. °· Try to eat at least 2 vegetarian meals each week. °· Eat more home-cooked food and less restaurant, buffet, and fast food. °· When eating at a restaurant, ask that your food be prepared with less salt or no salt, if possible. °What foods are recommended? °The items listed may not be a complete list. Talk with your dietitian about  **Note De-identified Horkey Obfuscation** what dietary choices are best for you. °Grains °Whole-grain or whole-wheat bread. Whole-grain or whole-wheat pasta. Brown rice. Oatmeal. Quinoa. Bulgur. Whole-grain and low-sodium cereals. Pita bread. Low-fat, low-sodium crackers. Whole-wheat flour tortillas. °Vegetables °Fresh or frozen vegetables (raw, steamed, roasted, or grilled). Low-sodium or reduced-sodium tomato and vegetable juice. Low-sodium or reduced-sodium tomato sauce and tomato paste. Low-sodium or reduced-sodium canned vegetables. °Fruits °All fresh, dried, or frozen fruit. Canned fruit in natural juice (without  added sugar). °Meat and other protein foods °Skinless chicken or turkey. Ground chicken or turkey. Pork with fat trimmed off. Fish and seafood. Egg whites. Dried beans, peas, or lentils. Unsalted nuts, nut butters, and seeds. Unsalted canned beans. Lean cuts of beef with fat trimmed off. Low-sodium, lean deli meat. °Dairy °Low-fat (1%) or fat-free (skim) milk. Fat-free, low-fat, or reduced-fat cheeses. Nonfat, low-sodium ricotta or cottage cheese. Low-fat or nonfat yogurt. Low-fat, low-sodium cheese. °Fats and oils °Soft margarine without trans fats. Vegetable oil. Low-fat, reduced-fat, or light mayonnaise and salad dressings (reduced-sodium). Canola, safflower, olive, soybean, and sunflower oils. Avocado. °Seasoning and other foods °Herbs. Spices. Seasoning mixes without salt. Unsalted popcorn and pretzels. Fat-free sweets. °What foods are not recommended? °The items listed may not be a complete list. Talk with your dietitian about what dietary choices are best for you. °Grains °Baked goods made with fat, such as croissants, muffins, or some breads. Dry pasta or rice meal packs. °Vegetables °Creamed or fried vegetables. Vegetables in a cheese sauce. Regular canned vegetables (not low-sodium or reduced-sodium). Regular canned tomato sauce and paste (not low-sodium or reduced-sodium). Regular tomato and vegetable juice (not low-sodium or reduced-sodium). Pickles. Olives. °Fruits °Canned fruit in a light or heavy syrup. Fried fruit. Fruit in cream or butter sauce. °Meat and other protein foods °Fatty cuts of meat. Ribs. Fried meat. Bacon. Sausage. Bologna and other processed lunch meats. Salami. Fatback. Hotdogs. Bratwurst. Salted nuts and seeds. Canned beans with added salt. Canned or smoked fish. Whole eggs or egg yolks. Chicken or turkey with skin. °Dairy °Whole or 2% milk, cream, and half-and-half. Whole or full-fat cream cheese. Whole-fat or sweetened yogurt. Full-fat cheese. Nondairy creamers. Whipped toppings.  Processed cheese and cheese spreads. °Fats and oils °Butter. Stick margarine. Lard. Shortening. Ghee. Bacon fat. Tropical oils, such as coconut, palm kernel, or palm oil. °Seasoning and other foods °Salted popcorn and pretzels. Onion salt, garlic salt, seasoned salt, table salt, and sea salt. Worcestershire sauce. Tartar sauce. Barbecue sauce. Teriyaki sauce. Soy sauce, including reduced-sodium. Steak sauce. Canned and packaged gravies. Fish sauce. Oyster sauce. Cocktail sauce. Horseradish that you find on the shelf. Ketchup. Mustard. Meat flavorings and tenderizers. Bouillon cubes. Hot sauce and Tabasco sauce. Premade or packaged marinades. Premade or packaged taco seasonings. Relishes. Regular salad dressings. °Where to find more information: °· National Heart, Lung, and Blood Institute: www.nhlbi.nih.gov °· American Heart Association: www.heart.org °Summary °· The DASH eating plan is a healthy eating plan that has been shown to reduce high blood pressure (hypertension). It may also reduce your risk for type 2 diabetes, heart disease, and stroke. °· With the DASH eating plan, you should limit salt (sodium) intake to 2,300 mg a day. If you have hypertension, you may need to reduce your sodium intake to 1,500 mg a day. °· When on the DASH eating plan, aim to eat more fresh fruits and vegetables, whole grains, lean proteins, low-fat dairy, and heart-healthy fats. °· Work with your health care provider or diet and nutrition specialist (dietitian) to adjust your eating plan to your  **Note De-identified Vary Obfuscation** individual calorie needs. °This information is not intended to replace advice given to you by your health care provider. Make sure you discuss any questions you have with your health care provider. °Document Released: 09/06/2011 Document Revised: 08/30/2017 Document Reviewed: 09/10/2016 °Elsevier Patient Education © 2020 [employer] ° °

## 2019-09-10 ENCOUNTER — Other Ambulatory Visit: Payer: Self-pay

## 2019-09-11 ENCOUNTER — Ambulatory Visit (INDEPENDENT_AMBULATORY_CARE_PROVIDER_SITE_OTHER): Payer: Medicare Other | Admitting: Family Medicine

## 2019-09-11 VITALS — BP 147/78 | HR 102 | Temp 98.7°F | Ht 65.0 in | Wt 227.0 lb

## 2019-09-11 DIAGNOSIS — G4733 Obstructive sleep apnea (adult) (pediatric): Secondary | ICD-10-CM | POA: Diagnosis not present

## 2019-09-11 DIAGNOSIS — S81802A Unspecified open wound, left lower leg, initial encounter: Secondary | ICD-10-CM

## 2019-09-11 DIAGNOSIS — T85511A Breakdown (mechanical) of esophageal anti-reflux device, initial encounter: Secondary | ICD-10-CM | POA: Diagnosis not present

## 2019-09-11 DIAGNOSIS — I10 Essential (primary) hypertension: Secondary | ICD-10-CM | POA: Diagnosis not present

## 2019-09-11 DIAGNOSIS — L400 Psoriasis vulgaris: Secondary | ICD-10-CM | POA: Diagnosis not present

## 2019-09-11 DIAGNOSIS — Z23 Encounter for immunization: Secondary | ICD-10-CM | POA: Diagnosis not present

## 2019-09-11 DIAGNOSIS — J449 Chronic obstructive pulmonary disease, unspecified: Secondary | ICD-10-CM | POA: Diagnosis not present

## 2019-09-11 MED ORDER — CEPHALEXIN 500 MG PO CAPS: 500.0000 mg | ORAL_CAPSULE | Freq: Four times a day (QID) | ORAL | 0 refills | Status: AC

## 2019-09-11 NOTE — Progress Notes (Signed)
**Note De-Identified [place] Obfuscation** Subjective: CC: Follow-up hypertension PCP: [name], DO PZW:CHENIDPOE Hailey Hernandez is a 63 y.o. female presenting to clinic today for:  1.  Hypertension Patient noted to have uncontrolled blood pressures at her last visit.  Her lisinopril was increased to 20 mg daily.  She is here for interval checkup.  No chest pain.  Her shortness of breath is stable.  She wears 3 L of oxygen at baseline.  2.  Left leg wound Patient with ongoing left-sided anterior leg wound.  She notes tenderness to palpation.  She had some drainage.  She was treated with Keflex for 5 days back in November.  This did not seem to resolve her wound.  She has been applying Neosporin to the affected area.    ROS: Per HPI  Allergies  Allergen Reactions  . Doxycycline Shortness Of Breath and Other (See Comments)    Drug interaction with Soriatane  . Tramadol Shortness Of Breath    Did not work  . Milnacipran     REACTION: dizzy  . Apremilast Rash    Rash, able to take this currently   Past Medical History:  Diagnosis Date  . Allergy   . Anxiety disorder   . Arthritis    bil legs  . Asthma   . Cancer ([place])   . Chronic bronchitis   . COPD (chronic obstructive pulmonary disease) ([place])   . Depression   . Diverticulitis   . DJD (degenerative joint disease)    spine  . Fibromyalgia   . GERD (gastroesophageal reflux disease)   . Glaucoma   . History of thyroid cancer   . HTN (hypertension)    no longer on BP medication  . Hyperlipidemia   . Impaired glucose tolerance 05/08/2013  . OSA (obstructive sleep apnea)    CPAP  last sleep study 8-10 yr.ag0  . Polymyalgia ([place])   . Psoriasis   . Rhinitis   . Tobacco abuse     Current Outpatient Medications:  .  albuterol (PROVENTIL HFA;VENTOLIN HFA) 108 (90 Base) MCG/ACT inhaler, Inhale 2 puffs into the lungs every 6 (six) hours as needed for wheezing or shortness of breath., Disp: 1 Inhaler, Rfl: 12 .  albuterol (PROVENTIL) (2.5 MG/3ML) 0.083% nebulizer  solution, Take 3 mLs (2.5 mg total) by nebulization every 4 (four) hours as needed for wheezing or shortness of breath. DX: 496, Disp: 180 vial, Rfl: 11 .  aspirin EC 81 MG tablet, Take 81 mg by mouth daily., Disp: , Rfl:  .  atorvastatin (LIPITOR) 80 MG tablet, Take 1 tablet (80 mg total) by mouth daily., Disp: 90 tablet, Rfl: 3 .  budesonide-formoterol (SYMBICORT) 160-4.5 MCG/ACT inhaler, Inhale 2 puffs then rinse mouth, twice daily, Disp: 1 Inhaler, Rfl: 12 .  colchicine 0.6 MG tablet, Take 1 tablet (0.6 mg total) by mouth daily., Disp: 90 tablet, Rfl: 3 .  FLUoxetine (PROZAC) 20 MG capsule, Take 1 capsule (20 mg total) by mouth daily., Disp: 90 capsule, Rfl: 3 .  fluticasone (FLONASE) 50 MCG/ACT nasal spray, Place 2 sprays into both nostrils daily. (Patient taking differently: Place 2 sprays into both nostrils daily as needed for allergies. ), Disp: 16 g, Rfl: 6 .  folic acid (FOLVITE) 1 MG tablet, Take 1 mg by mouth daily. , Disp: , Rfl:  .  hydrALAZINE (APRESOLINE) 10 MG tablet, Take 1 tablet (10 mg total) by mouth 2 (two) times daily as needed., Disp: 10 tablet, Rfl: 0 .  lisinopril (ZESTRIL) 20 MG tablet, Take **Note De-Identified Hailey Hernandez Obfuscation** 1 tablet (20 mg total) by mouth daily., Disp: 90 tablet, Rfl: 3 .  methotrexate (RHEUMATREX) 2.5 MG tablet, Take 20 mg by mouth once a week. Monday, Disp: , Rfl:  .  montelukast (SINGULAIR) 10 MG tablet, Take 1 tablet (10 mg total) by mouth daily., Disp: 90 tablet, Rfl: 3 .  Nebulizers (COMPRESSOR/NEBULIZER) MISC, Use up to 4 times daily when needed, Disp: 1 each, Rfl: 0 .  Respiratory Therapy Supplies (FLUTTER) DEVI, Patient to use device for ten minutes, three times daily, Disp: 1 each, Rfl: 0 .  cephALEXin (KEFLEX) 500 MG capsule, Take 1 capsule (500 mg total) by mouth 4 (four) times daily for 10 days., Disp: 40 capsule, Rfl: 0 Social History   Socioeconomic History  . Marital status: Married    Spouse name: Not on file  . Number of children: 3  . Years of education: 9  .  Highest education level: Not on file  Occupational History  . Occupation: disabled    Employer: DISABLED  Tobacco Use  . Smoking status: Current Every Day Smoker    Packs/day: 1.00    Years: 45.00    Pack years: 45.00    Types: Cigarettes  . Smokeless tobacco: Never Used  Substance and Sexual Activity  . Alcohol use: No  . Drug use: No  . Sexual activity: Not on file  Other Topics Concern  . Not on file  Social History Narrative   Married w/ children   Daily caffeine use   Social Determinants of Health   Financial Resource Strain:   . Difficulty of Paying Living Expenses: Not on file  Food Insecurity:   . Worried About Charity fundraiser in the Last Year: Not on file  . Ran Out of Food in the Last Year: Not on file  Transportation Needs:   . Lack of Transportation (Medical): Not on file  . Lack of Transportation (Non-Medical): Not on file  Physical Activity:   . Days of Exercise per Week: Not on file  . Minutes of Exercise per Session: Not on file  Stress:   . Feeling of Stress : Not on file  Social Connections:   . Frequency of Communication with Friends and Family: Not on file  . Frequency of Social Gatherings with Friends and Family: Not on file  . Attends Religious Services: Not on file  . Active Member of Clubs or Organizations: Not on file  . Attends Archivist Meetings: Not on file  . Marital Status: Not on file  Intimate Partner Violence:   . Fear of Current or Ex-Partner: Not on file  . Emotionally Abused: Not on file  . Physically Abused: Not on file  . Sexually Abused: Not on file   Family History  Problem Relation Age of Onset  . Emphysema Sister   . Hypertension Sister   . Heart attack Sister   . Emphysema Sister   . Alpha-1 antitrypsin deficiency Sister   . Alpha-1 antitrypsin deficiency Sister   . Allergies Sister   . Asthma Sister   . Heart disease Mother   . Cancer Mother   . Hyperlipidemia Mother   . Hypertension Mother   .  Heart attack Mother   . Heart disease Father   . Heart attack Father   . Cancer Sister   . Cancer Brother   . Hyperlipidemia Brother   . Hypertension Brother   . Tuberculosis Other        grandmother  . Hyperlipidemia Daughter   . **Note De-Identified [place] Obfuscation** Hypertension Daughter   . Hypertension Son     Objective: Office vital signs reviewed. BP (!) 147/78   Pulse (!) 102   Temp 98.7 F (37.1 Hailey) (Temporal)   Ht 5\' 5"  (1.651 m)   Wt 227 lb (103 kg)   SpO2 96%   BMI 37.77 kg/m   Physical Examination:  General: Awake, alert, chronically ill patient, No acute distress HEENT: Normal. MMM Cardio: regular rate and rhythm, S1S2 heard, no murmurs appreciated Pulm: Globally decreased breath sounds.  Normal work of breathing on 3 L of oxygen Skin: 1.75 cm x 3 cm circular ulceration of skin noted along the left anterior lower extremity.  There is surrounding erythema and tenderness palpation but no active drainage.  Assessment/ Plan: 63 y.o. female   1. Leg wound, left, initial encounter Suspect she has a mild infection though it is difficult to tell given use of Neosporin.  No active drainage on today's exam but she has quite a bit of tenderness and surrounding erythema.  Empiric treatment with Keflex for 10 days this round.  I discussed wound care and gave them Vaseline impregnated dressings to apply to the affected area.  Discontinue use of Neosporin.  Keep area clean with soap and water.  Follow-up in 2 weeks for wound recheck. - cephALEXin (KEFLEX) 500 MG capsule; Take 1 capsule (500 mg total) by mouth 4 (four) times daily for 10 days.  Dispense: 40 capsule; Refill: 0  2. Essential hypertension Now controlled with increased dose of lisinopril.  Check renal function.   - Basic Metabolic Panel   Orders Placed This Encounter  Procedures  . Basic Metabolic Panel   Meds ordered this encounter  Medications  . cephALEXin (KEFLEX) 500 MG capsule    Sig: Take 1 capsule (500 mg total) by mouth 4 (four) times  daily for 10 days.    Dispense:  40 capsule    Refill:  [place], DO [place] [phone]

## 2019-09-11 NOTE — Patient Instructions (Signed)
**Note De-Identified [place] Obfuscation** Wound care as we discussed.  See me back in 2 weeks  You had labs performed today.  You will be contacted with the results of the labs once they are available, usually in the next 3 business days for routine lab work.  If you have an active my chart account, they will be released to your MyChart.  If you prefer to have these labs released to you [facility] telephone, please let us know.  If you had a pap smear or biopsy performed, expect to be contacted in about 7-10 days.

## 2019-09-12 LAB — BASIC METABOLIC PANEL
BUN/Creatinine Ratio: 16 (ref 12–28)
BUN: 20 mg/dL (ref 8–27)
CO2: 27 mmol/L (ref 20–29)
Calcium: 9.8 mg/dL (ref 8.7–10.3)
Chloride: 99 mmol/L (ref 96–106)
Creatinine, Ser: 1.25 mg/dL — ABNORMAL HIGH (ref 0.57–1.00)
GFR calc Af Amer: 52 mL/min/{1.73_m2} — ABNORMAL LOW (ref >59)
GFR calc non Af Amer: 45 mL/min/{1.73_m2} — ABNORMAL LOW (ref >59)
Glucose: 88 mg/dL (ref 65–99)
Potassium: 4.9 mmol/L (ref 3.5–5.2)
Sodium: 141 mmol/L (ref 134–144)

## 2019-09-14 ENCOUNTER — Telehealth: Payer: Self-pay | Admitting: Family Medicine

## 2019-09-14 NOTE — Telephone Encounter (Signed)
**Note De-Identified [place] Obfuscation** Patient aware of lab results and reports she already has an appointment scheduled for 09/30/19 and will recheck labwork then.

## 2019-09-14 NOTE — Telephone Encounter (Signed)
**Note De-Identified [place] Obfuscation** Pt received a missed call regarding her lab results. Wants to be called back.

## 2019-09-30 ENCOUNTER — Ambulatory Visit (INDEPENDENT_AMBULATORY_CARE_PROVIDER_SITE_OTHER): Payer: Medicare Other

## 2019-09-30 ENCOUNTER — Encounter: Payer: Self-pay | Admitting: Family Medicine

## 2019-09-30 ENCOUNTER — Ambulatory Visit (INDEPENDENT_AMBULATORY_CARE_PROVIDER_SITE_OTHER): Payer: Medicare Other | Admitting: Family Medicine

## 2019-09-30 VITALS — BP 133/67 | HR 96 | Temp 98.6°F | Ht 65.0 in | Wt 227.0 lb

## 2019-09-30 DIAGNOSIS — N179 Acute kidney failure, unspecified: Secondary | ICD-10-CM | POA: Diagnosis not present

## 2019-09-30 DIAGNOSIS — S81802D Unspecified open wound, left lower leg, subsequent encounter: Secondary | ICD-10-CM

## 2019-09-30 DIAGNOSIS — S81802A Unspecified open wound, left lower leg, initial encounter: Secondary | ICD-10-CM | POA: Diagnosis not present

## 2019-09-30 MED ORDER — SILVER SULFADIAZINE 1 % EX CREA: 1.0000 "application " | TOPICAL_CREAM | Freq: Every day | CUTANEOUS | 0 refills | Status: DC

## 2019-09-30 NOTE — Progress Notes (Signed)
**Note De-Identified [place] Obfuscation** Subjective: CC: Follow-up wound PCP: Hailey Norlander, DO OVF:IEPPIRJJO Hailey Hernandez is a 63 y.o. female presenting to clinic today for:  1.  Left leg wound Patient is here for interval checkup.  At last visit I had instructed her to discontinue use of the Neosporin.  I extended her Keflex.  I also gave her Xeroform bandages to apply to the affected area.  She is here today and the wound has gotten somewhat smaller but continues to be open and irritated.  She reports pain at the site.  She has not been using any oral NSAIDs due to AKI.  She tried using Tylenol but did not find it especially helpful.   ROS: Per HPI  Allergies  Allergen Reactions  . Doxycycline Shortness Of Breath and Other (See Comments)    Drug interaction with Soriatane  . Tramadol Shortness Of Breath    Did not work  . Milnacipran     REACTION: dizzy  . Apremilast Rash    Rash, able to take this currently   Past Medical History:  Diagnosis Date  . Allergy   . Anxiety disorder   . Arthritis    bil legs  . Asthma   . Cancer ([place])   . Chronic bronchitis   . COPD (chronic obstructive pulmonary disease) ([place])   . Depression   . Diverticulitis   . DJD (degenerative joint disease)    spine  . Fibromyalgia   . GERD (gastroesophageal reflux disease)   . Glaucoma   . History of thyroid cancer   . HTN (hypertension)    no longer on BP medication  . Hyperlipidemia   . Impaired glucose tolerance 05/08/2013  . OSA (obstructive sleep apnea)    CPAP  last sleep study 8-10 yr.ag0  . Polymyalgia ([place])   . Psoriasis   . Rhinitis   . Tobacco abuse     Current Outpatient Medications:  .  albuterol (PROVENTIL HFA;VENTOLIN HFA) 108 (90 Base) MCG/ACT inhaler, Inhale 2 puffs into the lungs every 6 (six) hours as needed for wheezing or shortness of breath., Disp: 1 Inhaler, Rfl: 12 .  albuterol (PROVENTIL) (2.5 MG/3ML) 0.083% nebulizer solution, Take 3 mLs (2.5 mg total) by nebulization every 4 (four) hours as needed for  wheezing or shortness of breath. DX: 496, Disp: 180 vial, Rfl: 11 .  aspirin EC 81 MG tablet, Take 81 mg by mouth daily., Disp: , Rfl:  .  atorvastatin (LIPITOR) 80 MG tablet, Take 1 tablet (80 mg total) by mouth daily., Disp: 90 tablet, Rfl: 3 .  budesonide-formoterol (SYMBICORT) 160-4.5 MCG/ACT inhaler, Inhale 2 puffs then rinse mouth, twice daily, Disp: 1 Inhaler, Rfl: 12 .  colchicine 0.6 MG tablet, Take 1 tablet (0.6 mg total) by mouth daily., Disp: 90 tablet, Rfl: 3 .  FLUoxetine (PROZAC) 20 MG capsule, Take 1 capsule (20 mg total) by mouth daily., Disp: 90 capsule, Rfl: 3 .  fluticasone (FLONASE) 50 MCG/ACT nasal spray, Place 2 sprays into both nostrils daily. (Patient taking differently: Place 2 sprays into both nostrils daily as needed for allergies. ), Disp: 16 g, Rfl: 6 .  folic acid (FOLVITE) 1 MG tablet, Take 1 mg by mouth daily. , Disp: , Rfl:  .  hydrALAZINE (APRESOLINE) 10 MG tablet, Take 1 tablet (10 mg total) by mouth 2 (two) times daily as needed., Disp: 10 tablet, Rfl: 0 .  lisinopril (ZESTRIL) 20 MG tablet, Take 1 tablet (20 mg total) by mouth daily., Disp: 90 tablet, Rfl: 3 . **Note De-Identified [place] Obfuscation** methotrexate (RHEUMATREX) 2.5 MG tablet, Take 20 mg by mouth once a week. Monday, Disp: , Rfl:  .  montelukast (SINGULAIR) 10 MG tablet, Take 1 tablet (10 mg total) by mouth daily., Disp: 90 tablet, Rfl: 3 .  Nebulizers (COMPRESSOR/NEBULIZER) MISC, Use up to 4 times daily when needed, Disp: 1 each, Rfl: 0 .  Respiratory Therapy Supplies (FLUTTER) DEVI, Patient to use device for ten minutes, three times daily, Disp: 1 each, Rfl: 0 Social History   Socioeconomic History  . Marital status: Married    Spouse name: Not on file  . Number of children: 3  . Years of education: 9  . Highest education level: Not on file  Occupational History  . Occupation: disabled    Employer: DISABLED  Tobacco Use  . Smoking status: Current Every Day Smoker    Packs/day: 1.00    Years: 45.00    Pack years: 45.00     Types: Cigarettes  . Smokeless tobacco: Never Used  Substance and Sexual Activity  . Alcohol use: No  . Drug use: No  . Sexual activity: Not on file  Other Topics Concern  . Not on file  Social History Narrative   Married w/ children   Daily caffeine use   Social Determinants of Health   Financial Resource Strain:   . Difficulty of Paying Living Expenses: Not on file  Food Insecurity:   . Worried About Charity fundraiser in the Last Year: Not on file  . Ran Out of Food in the Last Year: Not on file  Transportation Needs:   . Lack of Transportation (Medical): Not on file  . Lack of Transportation (Non-Medical): Not on file  Physical Activity:   . Days of Exercise per Week: Not on file  . Minutes of Exercise per Session: Not on file  Stress:   . Feeling of Stress : Not on file  Social Connections:   . Frequency of Communication with Friends and Family: Not on file  . Frequency of Social Gatherings with Friends and Family: Not on file  . Attends Religious Services: Not on file  . Active Member of Clubs or Organizations: Not on file  . Attends Archivist Meetings: Not on file  . Marital Status: Not on file  Intimate Partner Violence:   . Fear of Current or Ex-Partner: Not on file  . Emotionally Abused: Not on file  . Physically Abused: Not on file  . Sexually Abused: Not on file   Family History  Problem Relation Age of Onset  . Emphysema Sister   . Hypertension Sister   . Heart attack Sister   . Emphysema Sister   . Alpha-1 antitrypsin deficiency Sister   . Alpha-1 antitrypsin deficiency Sister   . Allergies Sister   . Asthma Sister   . Heart disease Mother   . Cancer Mother   . Hyperlipidemia Mother   . Hypertension Mother   . Heart attack Mother   . Heart disease Father   . Heart attack Father   . Cancer Sister   . Cancer Brother   . Hyperlipidemia Brother   . Hypertension Brother   . Tuberculosis Other        grandmother  . Hyperlipidemia  Daughter   . Hypertension Daughter   . Hypertension Son     Objective: Office vital signs reviewed. BP 133/67   Pulse 96   Temp 98.6 F (37 Hailey) (Temporal)   Ht 5\' 5"  (1.651 m) **Note De-Identified Safi Obfuscation** Wt 227 lb (103 kg)   SpO2 98%   BMI 37.77 kg/m   Physical Examination:  General: Awake, alert, chronically ill patient, No acute distress HEENT: Normal. MMM Cardio: regular rate and rhythm, S1S2 heard, no murmurs appreciated Pulm: Normal work of breathing on 3 L of oxygen Skin: 1.75 cm x 2.25 cm circular ulceration of skin noted along the left anterior lower extremity.  There is minimal surrounding erythema and tenderness palpation.  DG Tibia/Fibula Left  Result Date: 09/30/2019 CLINICAL DATA:  Nonhealing wound left lower leg EXAM: LEFT TIBIA AND FIBULA - 2 VIEW COMPARISON:  None. FINDINGS: Soft tissue calcifications in the mid and lower calf. No soft tissue gas. No acute bony abnormality. No radiographic changes of osteomyelitis. IMPRESSION: No soft tissue gas or radiographic changes of osteomyelitis. Electronically Signed   By: Rolm Baptise M.D.   On: 09/30/2019 11:52   Assessment/ Plan: 63 y.o. female   1. Non-healing wound of lower extremity, left, subsequent encounter Because of ongoing wound and tenderness to palpation and x-ray was ordered.  Personal review of x-ray demonstrated no tunneling, gas.  Radiology review confirms no radiographic changes suggest osteomyelitis or soft tissue gas.  I have ordered Silvadene cream to flex the affected area.  She will continue Xeroform bandages.  I placed referral to wound care clinic.  Differential diagnoses to consider include cancers of the skin.  May need to consider referral to dermatology if wound does not heal as expected. - DG Tibia/Fibula Left; Future - silver sulfADIAZINE (SILVADENE) 1 % cream; Apply 1 application topically daily.  Dispense: 50 g; Refill: 0 - Ambulatory referral to Wound Clinic  2. AKI (acute kidney injury) ([place]) Has been abstaining  from oral NSAIDs.  Check BMP.  If renal function is back to normal I will prescribe her an oral NSAID to take so as to avoid overuse.  Otherwise, to use Tylenol 3 times daily along with ice for pain control.  Would hesitate to use any opioids given respiratory depression and need for oxygen. - Basic Metabolic Panel    No orders of the defined types were placed in this encounter.  No orders of the defined types were placed in this encounter.    Hailey Norlander, DO [place] [phone]

## 2019-09-30 NOTE — Patient Instructions (Signed)
**Note De-Identified [place] Obfuscation** I could not appreciate any tunneling of the wound.  I am waiting for the radiologist to give his formal review and will contact you with the results once available  For now I have sent in the sulfa Silvadene cream to apply to your wound every day.  Let it dry down some before applying the Xeroform bandage.  I placed a referral to wound care clinic and we will try and get you in there as soon as possible.  I am checking your kidney function again today.  If it is back to normal I will send in a medication similar to the Aleve.  If it is not, then I would continue just using extra strength Tylenol 3-4 times a day and ice over the affected painful area.

## 2019-10-01 LAB — BASIC METABOLIC PANEL
BUN/Creatinine Ratio: 21 (ref 12–28)
BUN: 21 mg/dL (ref 8–27)
CO2: 27 mmol/L (ref 20–29)
Calcium: 9.2 mg/dL (ref 8.7–10.3)
Chloride: 99 mmol/L (ref 96–106)
Creatinine, Ser: 1.01 mg/dL — ABNORMAL HIGH (ref 0.57–1.00)
GFR calc Af Amer: 68 mL/min/{1.73_m2} (ref >59)
GFR calc non Af Amer: 59 mL/min/{1.73_m2} — ABNORMAL LOW (ref >59)
Glucose: 97 mg/dL (ref 65–99)
Potassium: 5.2 mmol/L (ref 3.5–5.2)
Sodium: 140 mmol/L (ref 134–144)

## 2019-10-12 ENCOUNTER — Ambulatory Visit: Payer: Medicare Other | Admitting: Orthopedic Surgery

## 2019-10-12 DIAGNOSIS — G4733 Obstructive sleep apnea (adult) (pediatric): Secondary | ICD-10-CM | POA: Diagnosis not present

## 2019-10-12 DIAGNOSIS — T85511A Breakdown (mechanical) of esophageal anti-reflux device, initial encounter: Secondary | ICD-10-CM | POA: Diagnosis not present

## 2019-10-12 DIAGNOSIS — L400 Psoriasis vulgaris: Secondary | ICD-10-CM | POA: Diagnosis not present

## 2019-10-12 DIAGNOSIS — J449 Chronic obstructive pulmonary disease, unspecified: Secondary | ICD-10-CM | POA: Diagnosis not present

## 2019-10-13 ENCOUNTER — Other Ambulatory Visit: Payer: Self-pay | Admitting: Family Medicine

## 2019-10-13 ENCOUNTER — Encounter: Payer: Self-pay | Admitting: Family Medicine

## 2019-10-13 DIAGNOSIS — S81802D Unspecified open wound, left lower leg, subsequent encounter: Secondary | ICD-10-CM

## 2019-10-14 ENCOUNTER — Telehealth: Payer: Self-pay | Admitting: Family Medicine

## 2019-10-26 DIAGNOSIS — J449 Chronic obstructive pulmonary disease, unspecified: Secondary | ICD-10-CM | POA: Diagnosis not present

## 2019-10-26 DIAGNOSIS — L409 Psoriasis, unspecified: Secondary | ICD-10-CM | POA: Diagnosis not present

## 2019-10-26 DIAGNOSIS — Z5181 Encounter for therapeutic drug level monitoring: Secondary | ICD-10-CM | POA: Diagnosis not present

## 2019-11-06 ENCOUNTER — Ambulatory Visit (HOSPITAL_BASED_OUTPATIENT_CLINIC_OR_DEPARTMENT_OTHER): Payer: Medicare Other | Admitting: Internal Medicine

## 2019-11-09 DIAGNOSIS — G4733 Obstructive sleep apnea (adult) (pediatric): Secondary | ICD-10-CM | POA: Diagnosis not present

## 2019-11-12 DIAGNOSIS — G4733 Obstructive sleep apnea (adult) (pediatric): Secondary | ICD-10-CM | POA: Diagnosis not present

## 2019-11-12 DIAGNOSIS — T85511A Breakdown (mechanical) of esophageal anti-reflux device, initial encounter: Secondary | ICD-10-CM | POA: Diagnosis not present

## 2019-11-12 DIAGNOSIS — L400 Psoriasis vulgaris: Secondary | ICD-10-CM | POA: Diagnosis not present

## 2019-11-12 DIAGNOSIS — J449 Chronic obstructive pulmonary disease, unspecified: Secondary | ICD-10-CM | POA: Diagnosis not present

## 2019-11-18 ENCOUNTER — Telehealth: Payer: Self-pay | Admitting: Family Medicine

## 2019-11-18 NOTE — Telephone Encounter (Signed)
**Note De-Identified [place] Obfuscation** Genia from [facility] wanted to call to let Dr Darnell Level know that we referred pt to them and pt was a no call/no show.

## 2019-12-10 DIAGNOSIS — G4733 Obstructive sleep apnea (adult) (pediatric): Secondary | ICD-10-CM | POA: Diagnosis not present

## 2019-12-10 DIAGNOSIS — L400 Psoriasis vulgaris: Secondary | ICD-10-CM | POA: Diagnosis not present

## 2019-12-10 DIAGNOSIS — T85511A Breakdown (mechanical) of esophageal anti-reflux device, initial encounter: Secondary | ICD-10-CM | POA: Diagnosis not present

## 2019-12-10 DIAGNOSIS — J449 Chronic obstructive pulmonary disease, unspecified: Secondary | ICD-10-CM | POA: Diagnosis not present

## 2019-12-11 ENCOUNTER — Ambulatory Visit: Payer: Medicare Other | Admitting: Internal Medicine

## 2019-12-15 ENCOUNTER — Ambulatory Visit (INDEPENDENT_AMBULATORY_CARE_PROVIDER_SITE_OTHER): Payer: Medicare Other | Admitting: Family Medicine

## 2019-12-15 DIAGNOSIS — B372 Candidiasis of skin and nail: Secondary | ICD-10-CM

## 2019-12-15 DIAGNOSIS — L03311 Cellulitis of abdominal wall: Secondary | ICD-10-CM | POA: Diagnosis not present

## 2019-12-15 MED ORDER — NYSTATIN 100000 UNIT/GM EX POWD: 1.0000 "application " | Freq: Three times a day (TID) | CUTANEOUS | 0 refills | Status: DC

## 2019-12-15 MED ORDER — CEPHALEXIN 500 MG PO CAPS: 500.0000 mg | ORAL_CAPSULE | Freq: Four times a day (QID) | ORAL | 0 refills | Status: AC

## 2019-12-15 MED ORDER — FLUCONAZOLE 150 MG PO TABS: 150.0000 mg | ORAL_TABLET | Freq: Once | ORAL | 0 refills | Status: AC

## 2019-12-15 NOTE — Progress Notes (Signed)
**Note De-Identified [place] Obfuscation** Telephone visit  Subjective: CC: yeast infection PCP: [name], DO BWG:[id number] Hailey Hernandez is a 64 y.o. female calls for telephone consult today. Patient provides verbal consent for consult held [place] phone.  Due to COVID-19 pandemic this visit was conducted virtually. This visit type was conducted due to national recommendations for restrictions regarding the COVID-19 Pandemic (e.g. social distancing, sheltering in place) in an effort to limit this patient's exposure and mitigate transmission in our community. All issues noted in this document were discussed and addressed.  A physical exam was not performed with this format.   Location of patient: home Location of provider: Working remotely from home Others present for call: none  1. Rash Patient reports that she got really ill a few weeks ago.  She had a diarrhea illness.  She notes that she has a rash under her stomach that has extended down into her groin.  She had blisters.  She has been applying Desitin after showering.  She mistakenly applied her psorasis cream to the area, which caused the blisters.  She notes that she continues to have irritation in that area.  She reports some bloody drainage but no purulence.  No fevers.  She is trying to keep the area dry.   ROS: Per HPI  Allergies  Allergen Reactions  . Doxycycline Shortness Of Breath and Other (See Comments)    Drug interaction with Soriatane  . Tramadol Shortness Of Breath    Did not work  . Milnacipran     REACTION: dizzy  . Apremilast Rash    Rash, able to take this currently   Past Medical History:  Diagnosis Date  . Allergy   . Anxiety disorder   . Arthritis    bil legs  . Asthma   . Cancer ([place])   . Chronic bronchitis   . COPD (chronic obstructive pulmonary disease) ([place])   . Depression   . Diverticulitis   . DJD (degenerative joint disease)    spine  . Fibromyalgia   . GERD (gastroesophageal reflux disease)   . Glaucoma   . History of thyroid  cancer   . HTN (hypertension)    no longer on BP medication  . Hyperlipidemia   . Impaired glucose tolerance 05/08/2013  . OSA (obstructive sleep apnea)    CPAP  last sleep study 8-10 yr.ag0  . Polymyalgia ([place])   . Psoriasis   . Rhinitis   . Tobacco abuse     Current Outpatient Medications:  .  albuterol (PROVENTIL HFA;VENTOLIN HFA) 108 (90 Base) MCG/ACT inhaler, Inhale 2 puffs into the lungs every 6 (six) hours as needed for wheezing or shortness of breath., Disp: 1 Inhaler, Rfl: 12 .  albuterol (PROVENTIL) (2.5 MG/3ML) 0.083% nebulizer solution, Take 3 mLs (2.5 mg total) by nebulization every 4 (four) hours as needed for wheezing or shortness of breath. DX: 496, Disp: 180 vial, Rfl: 11 .  aspirin EC 81 MG tablet, Take 81 mg by mouth daily., Disp: , Rfl:  .  atorvastatin (LIPITOR) 80 MG tablet, Take 1 tablet (80 mg total) by mouth daily., Disp: 90 tablet, Rfl: 3 .  budesonide-formoterol (SYMBICORT) 160-4.5 MCG/ACT inhaler, Inhale 2 puffs then rinse mouth, twice daily, Disp: 1 Inhaler, Rfl: 12 .  colchicine 0.6 MG tablet, Take 1 tablet (0.6 mg total) by mouth daily., Disp: 90 tablet, Rfl: 3 .  FLUoxetine (PROZAC) 20 MG capsule, Take 1 capsule (20 mg total) by mouth daily., Disp: 90 capsule, Rfl: 3 .  fluticasone (FLONASE) **Note De-Identified Copes Obfuscation** 50 MCG/ACT nasal spray, Place 2 sprays into both nostrils daily. (Patient taking differently: Place 2 sprays into both nostrils daily as needed for allergies. ), Disp: 16 g, Rfl: 6 .  folic acid (FOLVITE) 1 MG tablet, Take 1 mg by mouth daily. , Disp: , Rfl:  .  hydrALAZINE (APRESOLINE) 10 MG tablet, Take 1 tablet (10 mg total) by mouth 2 (two) times daily as needed., Disp: 10 tablet, Rfl: 0 .  lisinopril (ZESTRIL) 20 MG tablet, Take 1 tablet (20 mg total) by mouth daily., Disp: 90 tablet, Rfl: 3 .  methotrexate (RHEUMATREX) 2.5 MG tablet, Take 20 mg by mouth once a week. Monday, Disp: , Rfl:  .  montelukast (SINGULAIR) 10 MG tablet, Take 1 tablet (10 mg total) by mouth  daily., Disp: 90 tablet, Rfl: 3 .  Nebulizers (COMPRESSOR/NEBULIZER) MISC, Use up to 4 times daily when needed, Disp: 1 each, Rfl: 0 .  Respiratory Therapy Supplies (FLUTTER) DEVI, Patient to use device for ten minutes, three times daily, Disp: 1 each, Rfl: 0 .  silver sulfADIAZINE (SILVADENE) 1 % cream, Apply 1 application topically daily., Disp: 50 g, Rfl: 0  Assessment/ Plan: 64 y.o. female   1. Candidal intertrigo I suspect that initial rash was likely candidal intertrigo.  I am going to place her on nystatin powder apply topically 3 times daily and also to start a oral Diflucan pill.  I suspect that there is a secondary bacterial infection and have added Keflex as below.  I discussed home care instructions including keeping area dry.  Monitoring for further progression.  If no significant improvement by Monday I would like to see her in office.  She is aware of instructions and red flag signs and symptoms.  She will follow up as directed - nystatin (MYCOSTATIN/NYSTOP) powder; Apply 1 application topically 3 (three) times daily. (apply to belly and groin folds)  Dispense: 60 g; Refill: 0 - fluconazole (DIFLUCAN) 150 MG tablet; Take 1 tablet (150 mg total) by mouth once for 1 dose. Repeat dose when antibiotic completed.  Dispense: 2 tablet; Refill: 0  2. Cellulitis, abdominal wall - cephALEXin (KEFLEX) 500 MG capsule; Take 1 capsule (500 mg total) by mouth 4 (four) times daily for 10 days.  Dispense: 40 capsule; Refill: 0   Start time: 8:30am End time: 8:40am  Total time spent on patient care (including telephone call/ virtual visit): 20 minutes  [place], [place] [phone]

## 2019-12-30 ENCOUNTER — Ambulatory Visit: Payer: Medicare Other

## 2020-01-05 ENCOUNTER — Telehealth: Payer: Self-pay | Admitting: Family Medicine

## 2020-01-05 NOTE — Chronic Care Management (AMB) (Signed)
**Note De-identified Ghee Obfuscation**  **Note De-Identified Hallmon Obfuscation** Chronic Care Management   Outreach Note  01/05/2020 Name: [name] MRN: [id number] DOB: 21-Aug-1956  Hailey Hernandez is a 64 y.o. year old female who is a primary care patient of [name], DO. I reached out to [place] by phone today in response to a referral sent by Ms. Miguel Dibble Jurgensen's health plan.     An unsuccessful telephone outreach was attempted today. The patient was referred to the case management team for assistance with care management and care coordination.   Follow Up Plan: A HIPPA compliant phone message was left for the patient providing contact information and requesting a return call. The care management team will reach out to the patient again over the next 7 days. If patient returns call to provider office, please advise to call [place] at [phone].  [place], [place] [address] Direct Dial: [phone] [email or web address]: [email or web address]

## 2020-01-06 NOTE — Chronic Care Management (AMB) (Signed)
**Note De-identified Barth Obfuscation**  **Note De-Identified Krider Obfuscation** Chronic Care Management   Note  01/06/2020 Name: Fredonia Casalino Fabio MRN: [id number] DOB: 1955/12/08  Ashwika Freels Vallone is a 64 y.o. year old female who is a primary care patient of [name], DO. I reached out to [place] by phone today in response to a referral sent by Ms. Miguel Dibble Hegna's health plan.     Ms. Goodlin was given information about Chronic Care Management services today including:  1. CCM service includes personalized support from designated clinical staff supervised by her physician, including individualized plan of care and coordination with other care providers 2. 24/7 contact phone numbers for assistance for urgent and routine care needs. 3. Service will only be billed when office clinical staff spend 20 minutes or more in a month to coordinate care. 4. Only one practitioner may furnish and bill the service in a calendar month. 5. The patient may stop CCM services at any time (effective at the end of the month) by phone call to the office staff. 6. The patient will be responsible for cost sharing (co-pay) of up to 20% of the service fee (after annual deductible is met).  Patient did not agree to enrollment in care management services and does not wish to consider at this time.  Follow up plan: The patient has been provided with contact information for the care management team and has been advised to call with any health related questions or concerns.   [place], [place] [address] Direct Dial: [phone] [email or web address] Website: [email or web address]

## 2020-01-10 DIAGNOSIS — J449 Chronic obstructive pulmonary disease, unspecified: Secondary | ICD-10-CM | POA: Diagnosis not present

## 2020-01-10 DIAGNOSIS — G4733 Obstructive sleep apnea (adult) (pediatric): Secondary | ICD-10-CM | POA: Diagnosis not present

## 2020-01-10 DIAGNOSIS — T85511A Breakdown (mechanical) of esophageal anti-reflux device, initial encounter: Secondary | ICD-10-CM | POA: Diagnosis not present

## 2020-01-10 DIAGNOSIS — L400 Psoriasis vulgaris: Secondary | ICD-10-CM | POA: Diagnosis not present

## 2020-01-15 ENCOUNTER — Ambulatory Visit: Payer: Medicare Other | Admitting: Internal Medicine

## 2020-01-22 ENCOUNTER — Ambulatory Visit: Payer: Medicare Other | Admitting: Internal Medicine

## 2020-01-25 DIAGNOSIS — L409 Psoriasis, unspecified: Secondary | ICD-10-CM | POA: Diagnosis not present

## 2020-01-25 DIAGNOSIS — Z5181 Encounter for therapeutic drug level monitoring: Secondary | ICD-10-CM | POA: Diagnosis not present

## 2020-01-25 DIAGNOSIS — Z79899 Other long term (current) drug therapy: Secondary | ICD-10-CM | POA: Diagnosis not present

## 2020-02-09 DIAGNOSIS — T85511A Breakdown (mechanical) of esophageal anti-reflux device, initial encounter: Secondary | ICD-10-CM | POA: Diagnosis not present

## 2020-02-09 DIAGNOSIS — J449 Chronic obstructive pulmonary disease, unspecified: Secondary | ICD-10-CM | POA: Diagnosis not present

## 2020-02-09 DIAGNOSIS — G4733 Obstructive sleep apnea (adult) (pediatric): Secondary | ICD-10-CM | POA: Diagnosis not present

## 2020-02-09 DIAGNOSIS — L400 Psoriasis vulgaris: Secondary | ICD-10-CM | POA: Diagnosis not present

## 2020-02-16 ENCOUNTER — Ambulatory Visit: Payer: Medicare Other | Admitting: Internal Medicine

## 2020-03-11 DIAGNOSIS — L400 Psoriasis vulgaris: Secondary | ICD-10-CM | POA: Diagnosis not present

## 2020-03-11 DIAGNOSIS — J449 Chronic obstructive pulmonary disease, unspecified: Secondary | ICD-10-CM | POA: Diagnosis not present

## 2020-03-11 DIAGNOSIS — G4733 Obstructive sleep apnea (adult) (pediatric): Secondary | ICD-10-CM | POA: Diagnosis not present

## 2020-03-11 DIAGNOSIS — T85511A Breakdown (mechanical) of esophageal anti-reflux device, initial encounter: Secondary | ICD-10-CM | POA: Diagnosis not present

## 2020-03-22 ENCOUNTER — Ambulatory Visit: Payer: Medicare Other | Admitting: Family Medicine

## 2020-04-01 ENCOUNTER — Ambulatory Visit: Payer: Medicare Other | Admitting: Internal Medicine

## 2020-04-05 DIAGNOSIS — G4733 Obstructive sleep apnea (adult) (pediatric): Secondary | ICD-10-CM | POA: Diagnosis not present

## 2020-04-10 DIAGNOSIS — J449 Chronic obstructive pulmonary disease, unspecified: Secondary | ICD-10-CM | POA: Diagnosis not present

## 2020-04-10 DIAGNOSIS — G4733 Obstructive sleep apnea (adult) (pediatric): Secondary | ICD-10-CM | POA: Diagnosis not present

## 2020-04-10 DIAGNOSIS — L400 Psoriasis vulgaris: Secondary | ICD-10-CM | POA: Diagnosis not present

## 2020-04-10 DIAGNOSIS — T85511A Breakdown (mechanical) of esophageal anti-reflux device, initial encounter: Secondary | ICD-10-CM | POA: Diagnosis not present

## 2020-04-29 ENCOUNTER — Other Ambulatory Visit: Payer: Self-pay

## 2020-04-29 ENCOUNTER — Ambulatory Visit (INDEPENDENT_AMBULATORY_CARE_PROVIDER_SITE_OTHER): Payer: Medicare Other | Admitting: Family Medicine

## 2020-04-29 ENCOUNTER — Encounter: Payer: Self-pay | Admitting: Family Medicine

## 2020-04-29 VITALS — BP 128/68 | HR 92 | Temp 97.8°F | Ht 65.0 in | Wt 215.0 lb

## 2020-04-29 DIAGNOSIS — L723 Sebaceous cyst: Secondary | ICD-10-CM

## 2020-04-29 DIAGNOSIS — Z5181 Encounter for therapeutic drug level monitoring: Secondary | ICD-10-CM | POA: Diagnosis not present

## 2020-04-29 DIAGNOSIS — L409 Psoriasis, unspecified: Secondary | ICD-10-CM | POA: Diagnosis not present

## 2020-04-29 DIAGNOSIS — F418 Other specified anxiety disorders: Secondary | ICD-10-CM | POA: Diagnosis not present

## 2020-04-29 DIAGNOSIS — J9611 Chronic respiratory failure with hypoxia: Secondary | ICD-10-CM

## 2020-04-29 DIAGNOSIS — R0789 Other chest pain: Secondary | ICD-10-CM

## 2020-04-29 NOTE — Patient Instructions (Signed)
**Note De-identified Steers Obfuscation** Epidermal Cyst  An epidermal cyst is a sac made of skin tissue. The sac contains a substance called keratin. Keratin is a protein that is normally secreted through the hair follicles. When keratin becomes trapped in the top layer of skin (epidermis), it can form an epidermal cyst. Epidermal cysts can be found anywhere on your body. These cysts are usually harmless (benign), and they may not cause symptoms unless they become infected. What are the causes? This condition may be caused by:  A blocked hair follicle.  A hair that curls and re-enters the skin instead of growing straight out of the skin (ingrown hair).  A blocked pore.  Irritated skin.  An injury to the skin.  Certain conditions that are passed along from parent to child (inherited).  Human papillomavirus (HPV).  Long-term (chronic) sun damage to the skin. What increases the risk? The following factors may make you more likely to develop an epidermal cyst:  Having acne.  Being overweight.  Being 30-40 years old. What are the signs or symptoms? The only symptom of this condition may be a small, painless lump underneath the skin. When an epidermal cyst ruptures, it may become infected. Symptoms may include:  Redness.  Inflammation.  Tenderness.  Warmth.  Fever.  Keratin draining from the cyst. Keratin is grayish-white, bad-smelling substance.  Pus draining from the cyst. How is this diagnosed? This condition is diagnosed with a physical exam.  In some cases, you may have a sample of tissue (biopsy) taken from your cyst to be examined under a microscope or tested for bacteria.  You may be referred to a health care provider who specializes in skin care (dermatologist). How is this treated? In many cases, epidermal cysts go away on their own without treatment. If a cyst becomes infected, treatment may include:  Opening and draining the cyst, done by a health care provider. After draining, minor surgery to  remove the rest of the cyst may be done.  Antibiotic medicine.  Injections of medicines (steroids) that help to reduce inflammation.  Surgery to remove the cyst. Surgery may be done if the cyst: ? Becomes large. ? Bothers you. ? Has a chance of turning into cancer.  Do not try to open a cyst yourself. Follow these instructions at home:  Take over-the-counter and prescription medicines only as told by your health care provider.  If you were prescribed an antibiotic medicine, take it it as told by your health care provider. Do not stop using the antibiotic even if you start to feel better.  Keep the area around your cyst clean and dry.  Wear loose, dry clothing.  Avoid touching your cyst.  Check your cyst every day for signs of infection. Check for: ? Redness, swelling, or pain. ? Fluid or blood. ? Warmth. ? Pus or a bad smell.  Keep all follow-up visits as told by your health care provider. This is important. How is this prevented?  Wear clean, dry, clothing.  Avoid wearing tight clothing.  Keep your skin clean and dry. Take showers or baths every day. Contact a health care provider if:  Your cyst develops symptoms of infection.  Your condition is not improving or is getting worse.  You develop a cyst that looks different from other cysts you have had.  You have a fever. Get help right away if:  Redness spreads from the cyst into the surrounding area. Summary  An epidermal cyst is a sac made of skin tissue. These cysts are  **Note De-identified Leoni Obfuscation** usually harmless (benign), and they may not cause symptoms unless they become infected.  If a cyst becomes infected, treatment may include surgery to open and drain the cyst, or to remove it. Treatment may also include medicines by mouth or through an injection.  Take over-the-counter and prescription medicines only as told by your health care provider. If you were prescribed an antibiotic medicine, take it as told by your health care  provider. Do not stop using the antibiotic even if you start to feel better.  Contact a health care provider if your condition is not improving or is getting worse.  Keep all follow-up visits as told by your health care provider. This is important. This information is not intended to replace advice given to you by your health care provider. Make sure you discuss any questions you have with your health care provider. Document Revised: 01/08/2019 Document Reviewed: 03/31/2018 Elsevier Patient Education  2020 Elsevier Inc.  

## 2020-04-29 NOTE — Progress Notes (Signed)
**Note De-Identified Krahl Obfuscation** Subjective: CC: Checkup PCP: [name], DO VOP:[id number] C Driggers is a 64 y.o. female presenting to clinic today for:  1.  Psoriasis Patient is seeing Dr. Mayo Ao at [facility] for psoriatic changes.  She is on methotrexate and they are asking to obtain CBC and LFTs every 3 months and having them copied to that office.  2.  COPD with respiratory failure Patient reports stability on 3 L O2.  She has not been monitoring her O2 sats at home regularly because she lost her O2 sensor.  She currently has this ordered with her insurance company.  Sometimes she will not use the O2 when she is in the basement.  Sometimes she feels unstable on her feet.  3.  Chest lump Patient reports a breast lump.  She points to her xiphoid.  She notes sometimes this hurts.  Denies any change in size but wanted to get this checked out  4.  Thigh lump Patient reports a right-sided thigh lump that has been present for a while now.  She was afraid that it might be cancerous and wanted to have it checked out.  No pain.  Sometimes if she hits it it will bruise   ROS: Per HPI  Allergies  Allergen Reactions   Doxycycline Shortness Of Breath and Other (See Comments)    Drug interaction with Soriatane   Tramadol Shortness Of Breath    Did not work   Milnacipran     REACTION: dizzy   Apremilast Rash    Rash, able to take this currently   Past Medical History:  Diagnosis Date   Allergy    Anxiety disorder    Arthritis    bil legs   Asthma    Cancer (HCC)    Chronic bronchitis    COPD (chronic obstructive pulmonary disease) (HCC)    Depression    Diverticulitis    DJD (degenerative joint disease)    spine   Fibromyalgia    GERD (gastroesophageal reflux disease)    Glaucoma    History of thyroid cancer    HTN (hypertension)    no longer on BP medication   Hyperlipidemia    Impaired glucose tolerance 05/08/2013   OSA (obstructive sleep apnea)     CPAP  last sleep study 8-10 yr.ag0   Polymyalgia ([place])    Psoriasis    Rhinitis    Tobacco abuse     Current Outpatient Medications:    albuterol (PROVENTIL HFA;VENTOLIN HFA) 108 (90 Base) MCG/ACT inhaler, Inhale 2 puffs into the lungs every 6 (six) hours as needed for wheezing or shortness of breath., Disp: 1 Inhaler, Rfl: 12   albuterol (PROVENTIL) (2.5 MG/3ML) 0.083% nebulizer solution, Take 3 mLs (2.5 mg total) by nebulization every 4 (four) hours as needed for wheezing or shortness of breath. DX: 496, Disp: 180 vial, Rfl: 11   aspirin EC 81 MG tablet, Take 81 mg by mouth daily., Disp: , Rfl:    atorvastatin (LIPITOR) 80 MG tablet, Take 1 tablet (80 mg total) by mouth daily., Disp: 90 tablet, Rfl: 3   budesonide-formoterol (SYMBICORT) 160-4.5 MCG/ACT inhaler, Inhale 2 puffs then rinse mouth, twice daily, Disp: 1 Inhaler, Rfl: 12   colchicine 0.6 MG tablet, Take 1 tablet (0.6 mg total) by mouth daily., Disp: 90 tablet, Rfl: 3   FLUoxetine (PROZAC) 20 MG capsule, Take 1 capsule (20 mg total) by mouth daily., Disp: 90 capsule, Rfl: 3   fluticasone (FLONASE) 50 MCG/ACT nasal spray, **Note De-Identified Belcastro Obfuscation** Place 2 sprays into both nostrils daily. (Patient taking differently: Place 2 sprays into both nostrils daily as needed for allergies. ), Disp: 16 g, Rfl: 6   folic acid (FOLVITE) 1 MG tablet, Take 1 mg by mouth daily. , Disp: , Rfl:    hydrALAZINE (APRESOLINE) 10 MG tablet, Take 1 tablet (10 mg total) by mouth 2 (two) times daily as needed., Disp: 10 tablet, Rfl: 0   lisinopril (ZESTRIL) 20 MG tablet, Take 1 tablet (20 mg total) by mouth daily., Disp: 90 tablet, Rfl: 3   methotrexate (RHEUMATREX) 2.5 MG tablet, Take 20 mg by mouth once a week. Monday, Disp: , Rfl:    montelukast (SINGULAIR) 10 MG tablet, Take 1 tablet (10 mg total) by mouth daily., Disp: 90 tablet, Rfl: 3   Nebulizers (COMPRESSOR/NEBULIZER) MISC, Use up to 4 times daily when needed, Disp: 1 each, Rfl: 0   nystatin  (MYCOSTATIN/NYSTOP) powder, Apply 1 application topically 3 (three) times daily. (apply to belly and groin folds), Disp: 60 g, Rfl: 0   Respiratory Therapy Supplies (FLUTTER) DEVI, Patient to use device for ten minutes, three times daily, Disp: 1 each, Rfl: 0   silver sulfADIAZINE (SILVADENE) 1 % cream, Apply 1 application topically daily., Disp: 50 g, Rfl: 0 Social History   Socioeconomic History   Marital status: Married    Spouse name: Not on file   Number of children: 3   Years of education: 9   Highest education level: Not on file  Occupational History   Occupation: disabled    Employer: DISABLED  Tobacco Use   Smoking status: Current Every Day Smoker    Packs/day: 1.00    Years: 45.00    Pack years: 45.00    Types: Cigarettes   Smokeless tobacco: Never Used  Scientific laboratory technician Use: Never used  Substance and Sexual Activity   Alcohol use: No   Drug use: No   Sexual activity: Not on file  Other Topics Concern   Not on file  Social History Narrative   Married w/ children   Daily caffeine use   Social Determinants of Health   Financial Resource Strain:    Difficulty of Paying Living Expenses:   Food Insecurity:    Worried About Charity fundraiser in the Last Year:    Arboriculturist in the Last Year:   Transportation Needs:    Film/video editor (Medical):    Lack of Transportation (Non-Medical):   Physical Activity:    Days of Exercise per Week:    Minutes of Exercise per Session:   Stress:    Feeling of Stress :   Social Connections:    Frequency of Communication with Friends and Family:    Frequency of Social Gatherings with Friends and Family:    Attends Religious Services:    Active Member of Clubs or Organizations:    Attends Music therapist:    Marital Status:   Intimate Partner Violence:    Fear of Current or Ex-Partner:    Emotionally Abused:    Physically Abused:    Sexually Abused:    Family  History  Problem Relation Age of Onset   Emphysema Sister    Hypertension Sister    Heart attack Sister    Emphysema Sister    Alpha-1 antitrypsin deficiency Sister    Alpha-1 antitrypsin deficiency Sister    Allergies Sister    Asthma Sister    Heart disease Mother **Note De-Identified Holberg Obfuscation** Cancer Mother    Hyperlipidemia Mother    Hypertension Mother    Heart attack Mother    Heart disease Father    Heart attack Father    Cancer Sister    Cancer Brother    Hyperlipidemia Brother    Hypertension Brother    Tuberculosis Other        grandmother   Hyperlipidemia Daughter    Hypertension Daughter    Hypertension Son     Objective: Office vital signs reviewed. BP 128/68    Pulse 92    Temp 97.8 F (36.6 C)    Ht '5\' 5"'  (1.651 m)    Wt (!) 215 lb (97.5 kg)    SpO2 95% Comment: 3L O2   BMI 35.78 kg/m   Physical Examination:  General: Awake, alert, appears healthier than I have seen her recently, No acute distress HEENT: Normal; sclera white Cardio: regular rate and rhythm, S1S2 heard, no murmurs appreciated Pulm: Globally decreased breath sounds.  No wheezes.  Normal work of breathing on 3 L of supplemental oxygen Chest: Area of concern is at the ridge of her xiphoid process.  Minimal tenderness palpation Extremities: warm, well perfused, No edema, cyanosis or clubbing; +2 pulses bilaterally MSK: Slow and antalgic gait and station Skin: dry; intact; no rashes; pea-sized sebaceous cyst noted along the right lateral thigh.  This is well-circumscribed, mobile and nontender. Neuro: Alert and oriented  Assessment/ Plan: 64 y.o. female   1. Chronic respiratory failure with hypoxia (HCC) Stable  2. Depression with anxiety Stable  3. Psoriasis Needs every 3 months LFTs and CBC sent to Dr. Mayo Ao with dermatology - CMP14+EGFR; Standing - CBC with Differential; Standing - CBC with Differential - CMP14+EGFR  4. Medication monitoring encounter - CMP14+EGFR;  Standing - CBC with Differential; Standing - CBC with Differential - CMP14+EGFR  5. Sebaceous cyst Reassurance.  Handout provided  6. Xyphoidalgia Reassurance.  Ice.   Orders Placed This Encounter  Procedures   CMP14+EGFR    Standing Status:   Standing    Number of Occurrences:   4    Standing Expiration Date:   04/29/2021   CBC with Differential    Standing Status:   Standing    Number of Occurrences:   4    Standing Expiration Date:   04/29/2021    Order Specific Question:   CC Results    Answer:   Orville Govern [[id number]]   No orders of the defined types were placed in this encounter.    [name], DO [place] [phone]

## 2020-04-30 LAB — CMP14+EGFR
ALT: 20 IU/L (ref 0–32)
AST: 31 IU/L (ref 0–40)
Albumin/Globulin Ratio: 1.2 (ref 1.2–2.2)
Albumin: 3.9 g/dL (ref 3.8–4.8)
Alkaline Phosphatase: 197 IU/L — ABNORMAL HIGH (ref 48–121)
BUN/Creatinine Ratio: 16 (ref 12–28)
BUN: 14 mg/dL (ref 8–27)
Bilirubin Total: 0.4 mg/dL (ref 0.0–1.2)
CO2: 30 mmol/L — ABNORMAL HIGH (ref 20–29)
Calcium: 8.6 mg/dL — ABNORMAL LOW (ref 8.7–10.3)
Chloride: 100 mmol/L (ref 96–106)
Creatinine, Ser: 0.88 mg/dL (ref 0.57–1.00)
GFR calc Af Amer: 79 mL/min/{1.73_m2} (ref >59)
GFR calc non Af Amer: 69 mL/min/{1.73_m2} (ref >59)
Globulin, Total: 3.2 g/dL (ref 1.5–4.5)
Glucose: 82 mg/dL (ref 65–99)
Potassium: 4.4 mmol/L (ref 3.5–5.2)
Sodium: 141 mmol/L (ref 134–144)
Total Protein: 7.1 g/dL (ref 6.0–8.5)

## 2020-04-30 LAB — CBC WITH DIFFERENTIAL/PLATELET
Basophils Absolute: 0 10*3/uL (ref 0.0–0.2)
Basos: 1 %
EOS (ABSOLUTE): 0.3 10*3/uL (ref 0.0–0.4)
Eos: 4 %
Hematocrit: 38.2 % (ref 34.0–46.6)
Hemoglobin: 13 g/dL (ref 11.1–15.9)
Immature Grans (Abs): 0 10*3/uL (ref 0.0–0.1)
Immature Granulocytes: 0 %
Lymphocytes Absolute: 1.6 10*3/uL (ref 0.7–3.1)
Lymphs: 22 %
MCH: 34.4 pg — ABNORMAL HIGH (ref 26.6–33.0)
MCHC: 34 g/dL (ref 31.5–35.7)
MCV: 101 fL — ABNORMAL HIGH (ref 79–97)
Monocytes Absolute: 0.6 10*3/uL (ref 0.1–0.9)
Monocytes: 8 %
Neutrophils Absolute: 4.7 10*3/uL (ref 1.4–7.0)
Neutrophils: 65 %
Platelets: 125 10*3/uL — ABNORMAL LOW (ref 150–450)
RBC: 3.78 x10E6/uL (ref 3.77–5.28)
RDW: 13.5 % (ref 11.7–15.4)
WBC: 7.2 10*3/uL (ref 3.4–10.8)

## 2020-05-09 ENCOUNTER — Other Ambulatory Visit: Payer: Self-pay | Admitting: Family Medicine

## 2020-05-09 DIAGNOSIS — F419 Anxiety disorder, unspecified: Secondary | ICD-10-CM

## 2020-05-10 DIAGNOSIS — H34811 Central retinal vein occlusion, right eye, with macular edema: Secondary | ICD-10-CM | POA: Diagnosis not present

## 2020-05-10 DIAGNOSIS — Z961 Presence of intraocular lens: Secondary | ICD-10-CM | POA: Diagnosis not present

## 2020-05-10 DIAGNOSIS — H10413 Chronic giant papillary conjunctivitis, bilateral: Secondary | ICD-10-CM | POA: Diagnosis not present

## 2020-05-10 DIAGNOSIS — E119 Type 2 diabetes mellitus without complications: Secondary | ICD-10-CM | POA: Diagnosis not present

## 2020-05-11 ENCOUNTER — Other Ambulatory Visit: Payer: Self-pay

## 2020-05-11 DIAGNOSIS — T85511A Breakdown (mechanical) of esophageal anti-reflux device, initial encounter: Secondary | ICD-10-CM | POA: Diagnosis not present

## 2020-05-11 DIAGNOSIS — H35362 Drusen (degenerative) of macula, left eye: Secondary | ICD-10-CM | POA: Diagnosis not present

## 2020-05-11 DIAGNOSIS — Z780 Asymptomatic menopausal state: Secondary | ICD-10-CM

## 2020-05-11 DIAGNOSIS — H4311 Vitreous hemorrhage, right eye: Secondary | ICD-10-CM | POA: Diagnosis not present

## 2020-05-11 DIAGNOSIS — H34831 Tributary (branch) retinal vein occlusion, right eye, with macular edema: Secondary | ICD-10-CM | POA: Diagnosis not present

## 2020-05-11 DIAGNOSIS — L400 Psoriasis vulgaris: Secondary | ICD-10-CM | POA: Diagnosis not present

## 2020-05-11 DIAGNOSIS — Z79899 Other long term (current) drug therapy: Secondary | ICD-10-CM

## 2020-05-11 DIAGNOSIS — J449 Chronic obstructive pulmonary disease, unspecified: Secondary | ICD-10-CM | POA: Diagnosis not present

## 2020-05-11 DIAGNOSIS — H43813 Vitreous degeneration, bilateral: Secondary | ICD-10-CM | POA: Diagnosis not present

## 2020-05-11 DIAGNOSIS — M810 Age-related osteoporosis without current pathological fracture: Secondary | ICD-10-CM

## 2020-05-11 DIAGNOSIS — G4733 Obstructive sleep apnea (adult) (pediatric): Secondary | ICD-10-CM | POA: Diagnosis not present

## 2020-05-11 DIAGNOSIS — E119 Type 2 diabetes mellitus without complications: Secondary | ICD-10-CM

## 2020-06-03 ENCOUNTER — Encounter: Payer: Self-pay | Admitting: Internal Medicine

## 2020-06-03 ENCOUNTER — Ambulatory Visit: Payer: Medicare Other | Admitting: Internal Medicine

## 2020-06-03 ENCOUNTER — Ambulatory Visit (INDEPENDENT_AMBULATORY_CARE_PROVIDER_SITE_OTHER): Payer: Medicare Other

## 2020-06-03 ENCOUNTER — Other Ambulatory Visit: Payer: Self-pay

## 2020-06-03 DIAGNOSIS — Z720 Tobacco use: Secondary | ICD-10-CM | POA: Diagnosis not present

## 2020-06-03 DIAGNOSIS — J9611 Chronic respiratory failure with hypoxia: Secondary | ICD-10-CM

## 2020-06-03 DIAGNOSIS — J449 Chronic obstructive pulmonary disease, unspecified: Secondary | ICD-10-CM

## 2020-06-03 MED ORDER — ALBUTEROL SULFATE HFA 108 (90 BASE) MCG/ACT IN AERS: INHALATION_SPRAY | RESPIRATORY_TRACT | 12 refills | Status: DC

## 2020-06-03 MED ORDER — ALBUTEROL SULFATE (2.5 MG/3ML) 0.083% IN NEBU: 2.5000 mg | INHALATION_SOLUTION | RESPIRATORY_TRACT | 12 refills | Status: DC | PRN

## 2020-06-03 NOTE — Assessment & Plan Note (Signed)
**Note De-Identified Boeder Obfuscation** Resistant to change. We will continue to encourage her to consider trying. Support available.

## 2020-06-03 NOTE — Patient Instructions (Signed)
**Note De-Identified Knighton Obfuscation** Refills sent for albuterol rescue inhaler and neb solution  Order CXR   Dx COPD mixed type  Please keep trying to stop smoking  Get your Flu shot this Fall  Get a booster for your Hailey Hernandez and Hailey Hernandez covid vaccine when available  Please call if we can help

## 2020-06-03 NOTE — Progress Notes (Signed)
**Note De-Identified Ogburn Obfuscation** Patient ID: Hailey Hernandez, female    DOB: January 05, 1956, 64 y.o.   MRN: [id number]  HPI female smoker followed for chronic bronchitis/COPD, chronic hypoxic respiratory failure, tobacco use (1 PPD/45 pack years), OSA, obesity hypoventilation, complicated by psoriasis O2 2-3 L sleep/exertion, CPAP 13/[facility] Office Spirometry-06/07/17-severe obstructive airways disease, severe restriction of exhaled volume. FVC 1.18/37%, FEV1 0.90/36%, ratio 0.76, FEF 25-75% 0.73/33% -----------------------------------------------------------------------------------------  05/08/2018- 64 year old female smoker  followed for chronic bronchitis/COPD, chronic hypoxic respiratory failure, tobacco use (1 PPD/45 pack years), OSA, obesity hypoventilation, complicated by psoriasis/ MTX  O2 2-3 L sleep/exertion, CPAP 13/[facility] -----1 month follow up. Patient states she has been feeling much better since last visit.  Labs 04/07/2018-WBC 8800, hemoglobin 15.2, platelets 135 k, Clearly feeling much better.  Remains oxygen dependent at 2-3 L. Quit smoking as of July 3!!-Warmly congratulated and encouraged to stick with it. She is going to talk with her rheumatologist about restarting methotrexate for psoriasis CXR 04/07/2018- IMPRESSION: New left lung base opacity compatible with pneumonia involving the lingula and/or anterior lower lobe. No pleural effusion. Followup PA and lateral chest X-ray is recommended in 3-4 weeks, bicarb 47 following trial of antibiotic therapy to ensure resolution and exclude underlying malignancy.  06/04/20- 64 year old female smoker  followed for chronic bronchitis/COPD, chronic hypoxic respiratory failure, tobacco use (1 PPD/45 pack years), OSA, obesity hypoventilation, complicated by psoriasis/ MTX, DM2,  O2 2-3 L sleep/exertion, CPAP 13/[facility] 2 sisters died a1AT def/ breast cancer                                  Husband here ------copd,sob with exertion, nonproductive cough Flutter device, Symbicort  160, albuterol hfa, neb albuterol,        MTX Had Johnson covax Denies significant change. Stable cough, mostly dry or scant sputum, no blood.  Uses nebulizer 1-2x/ day. Relative gave her some Symbicort so doesn't need that filled yet. Smoking 1-2 ppd-- discussed again.  CXR 11/03/18- IMPRESSION: 1. Significantly improved right middle lobe pneumonia. 2. Mild left basilar atelectasis. 3. Mild bronchitic changes with progression.  Review of Systems-see HPI   + = positive Constitutional:   No-   weight loss, night sweats, fevers, chills, fatigue, lassitude. HEENT:    headaches, difficulty swallowing, tooth/dental problems, sore throat,        sneezing, itching,  ear ache,  +nasal congestion, +post nasal drip,  CV:  No-   chest pain, orthopnea, PND, swelling in lower extremities, anasarca,   dizziness, palpitations Resp: +  shortness of breath with exertion or at rest.               productive cough,  + non-productive cough,  No- coughing up of blood.               change in color of mucus.  + wheezing.   Skin: +psoriasis GI:  No-   heartburn, indigestion, abdominal pain, nausea, vomiting,  GU: No-    MS:  + joint pain or swelling. . Neuro-     nothing unusual Psych:  No- change in mood or affect. No depression or anxiety.  No memory loss.   Objective:   Physical Exam General- Alert, Oriented, Affect-appropriate, Distress- none acute, +obese.  On 3L POC O2 sat 95% Skin- +extensive psoriasis plaques Lymphadenopathy- none Head- atraumatic            Eyes- Gross vision intact, PERRLA, conjunctivae clear secretions **Note De-Identified Wissinger Obfuscation** Ears- Hearing ok            Nose-  no-Septal dev, mucus, polyps, erosion, perforation             Throat- Mallampati III , mucosa clear , drainage- none, tonsils- atrophic.                        +dentures Neck- flexible , trachea midline, no stridor , thyroid nl, carotid no bruit Chest - symmetrical excursion , unlabored           Heart/CV- RRR , no murmur , no  gallop  , no rub, nl s1 s2                           - JVD- none , edema- none, stasis dermatitis changes+ bilateral,                                 varices- none           Lung- Diminished,  Wheeze- none, dullness-none,                                                    rub- none, , cough + loose           Chest wall-  Abd-  Br/ Gen/ Rectal- Not done, not indicated Extrem- +apparent lipoma right medial knee with +stasis changes,  Neuro- grossly intact to observation

## 2020-06-03 NOTE — Assessment & Plan Note (Signed)
**Note De-Identified Tobin Obfuscation** Severe but without dramatic change. No recent pneumonia or exacerbation Plan- refill meds, CXR, Flu vax when available

## 2020-06-03 NOTE — Assessment & Plan Note (Signed)
**Note De-Identified Hanel Obfuscation** Remains oxygen dependent Plan- continue O2 2-3L

## 2020-06-07 ENCOUNTER — Telehealth: Payer: Self-pay | Admitting: Internal Medicine

## 2020-06-07 NOTE — Telephone Encounter (Signed)
**Note De-Identified Sosnowski Obfuscation** Spoke with patient regarding prior message. Patient stated she was already notified with result's. Patient's voice waqs understanding. Nothing else further needed.

## 2020-06-08 DIAGNOSIS — H34831 Tributary (branch) retinal vein occlusion, right eye, with macular edema: Secondary | ICD-10-CM | POA: Diagnosis not present

## 2020-06-11 DIAGNOSIS — J449 Chronic obstructive pulmonary disease, unspecified: Secondary | ICD-10-CM | POA: Diagnosis not present

## 2020-06-11 DIAGNOSIS — T85511A Breakdown (mechanical) of esophageal anti-reflux device, initial encounter: Secondary | ICD-10-CM | POA: Diagnosis not present

## 2020-06-11 DIAGNOSIS — L400 Psoriasis vulgaris: Secondary | ICD-10-CM | POA: Diagnosis not present

## 2020-06-11 DIAGNOSIS — G4733 Obstructive sleep apnea (adult) (pediatric): Secondary | ICD-10-CM | POA: Diagnosis not present

## 2020-06-15 ENCOUNTER — Other Ambulatory Visit: Payer: Self-pay | Admitting: Family Medicine

## 2020-06-15 DIAGNOSIS — J410 Simple chronic bronchitis: Secondary | ICD-10-CM

## 2020-06-15 DIAGNOSIS — J9611 Chronic respiratory failure with hypoxia: Secondary | ICD-10-CM

## 2020-06-29 ENCOUNTER — Ambulatory Visit
Admission: RE | Admit: 2020-06-29 | Discharge: 2020-06-29 | Disposition: A | Discharge status: Home / Self Care | Payer: Medicare Other | Source: Ambulatory Visit | Attending: Family Medicine | Admitting: Family Medicine

## 2020-06-29 ENCOUNTER — Other Ambulatory Visit: Payer: Self-pay

## 2020-06-29 DIAGNOSIS — Z1231 Encounter for screening mammogram for malignant neoplasm of breast: Secondary | ICD-10-CM

## 2020-07-11 DIAGNOSIS — J449 Chronic obstructive pulmonary disease, unspecified: Secondary | ICD-10-CM | POA: Diagnosis not present

## 2020-07-11 DIAGNOSIS — G4733 Obstructive sleep apnea (adult) (pediatric): Secondary | ICD-10-CM | POA: Diagnosis not present

## 2020-07-11 DIAGNOSIS — L400 Psoriasis vulgaris: Secondary | ICD-10-CM | POA: Diagnosis not present

## 2020-07-11 DIAGNOSIS — T85511A Breakdown (mechanical) of esophageal anti-reflux device, initial encounter: Secondary | ICD-10-CM | POA: Diagnosis not present

## 2020-07-21 DIAGNOSIS — H43813 Vitreous degeneration, bilateral: Secondary | ICD-10-CM | POA: Diagnosis not present

## 2020-07-21 DIAGNOSIS — H35362 Drusen (degenerative) of macula, left eye: Secondary | ICD-10-CM | POA: Diagnosis not present

## 2020-07-21 DIAGNOSIS — H4311 Vitreous hemorrhage, right eye: Secondary | ICD-10-CM | POA: Diagnosis not present

## 2020-07-21 DIAGNOSIS — H34831 Tributary (branch) retinal vein occlusion, right eye, with macular edema: Secondary | ICD-10-CM | POA: Diagnosis not present

## 2020-07-21 DIAGNOSIS — H3561 Retinal hemorrhage, right eye: Secondary | ICD-10-CM | POA: Diagnosis not present

## 2020-08-01 ENCOUNTER — Ambulatory Visit: Payer: Medicare Other | Admitting: Family Medicine

## 2020-08-05 ENCOUNTER — Other Ambulatory Visit: Payer: Self-pay

## 2020-08-05 ENCOUNTER — Ambulatory Visit (INDEPENDENT_AMBULATORY_CARE_PROVIDER_SITE_OTHER): Payer: Medicare Other

## 2020-08-05 ENCOUNTER — Ambulatory Visit (INDEPENDENT_AMBULATORY_CARE_PROVIDER_SITE_OTHER): Payer: Medicare Other | Admitting: Family Medicine

## 2020-08-05 VITALS — BP 132/73 | HR 91 | Temp 97.4°F | Ht 65.0 in | Wt 216.0 lb

## 2020-08-05 DIAGNOSIS — L409 Psoriasis, unspecified: Secondary | ICD-10-CM

## 2020-08-05 DIAGNOSIS — Z780 Asymptomatic menopausal state: Secondary | ICD-10-CM | POA: Diagnosis not present

## 2020-08-05 DIAGNOSIS — M85852 Other specified disorders of bone density and structure, left thigh: Secondary | ICD-10-CM | POA: Diagnosis not present

## 2020-08-05 DIAGNOSIS — Z79899 Other long term (current) drug therapy: Secondary | ICD-10-CM | POA: Diagnosis not present

## 2020-08-05 DIAGNOSIS — J9611 Chronic respiratory failure with hypoxia: Secondary | ICD-10-CM | POA: Diagnosis not present

## 2020-08-05 DIAGNOSIS — E119 Type 2 diabetes mellitus without complications: Secondary | ICD-10-CM

## 2020-08-05 DIAGNOSIS — Z23 Encounter for immunization: Secondary | ICD-10-CM | POA: Diagnosis not present

## 2020-08-05 DIAGNOSIS — M799 Soft tissue disorder, unspecified: Secondary | ICD-10-CM

## 2020-08-05 DIAGNOSIS — M810 Age-related osteoporosis without current pathological fracture: Secondary | ICD-10-CM | POA: Diagnosis not present

## 2020-08-05 LAB — BAYER DCA HB A1C WAIVED: HB A1C (BAYER DCA - WAIVED): 4.6 % (ref <7.0)

## 2020-08-05 NOTE — Progress Notes (Signed)
**Note De-Identified Oats Obfuscation** Subjective: CC: f/u Psoriasis, COPD w/ respiratory failure. PCP: [name], DO LKJ:[id number] Hailey Hernandez is a 64 y.o. female presenting to clinic today for:  1. Psoriasis/ abdominal lump Patient is seeing Dr. Mayo Ao at [facility] for psoriatic changes.    Apparently patient will be following up with Dr. Lyman Speller once per year.  She does report improvement in her psoriatic plaques but still has small plaques on the elbows, inner thighs and abdomen.  She is down to 6 tablets of methotrexate every Sunday.  She has been self tapering as per his guidance.  Does not report any chills or bone type pain but she does report that she has a painful lump in the right lower abdomen.  No drainage.  No redness or heat.  2. COPD with respiratory failure She is followed by pulmonology with last visit in September.  She continues to smoke tobacco.  She is on 3 L of supplemental oxygen and has been stable.   Apparently she was told after her last chest x-ray that she had some changes however.  She thought that this was infectious but notes that she was never placed on any antibiotics.  Her follow-up with her pulmonologist is not until January  3. Diabetes Patient with diet-controlled diabetes.  She is not had an A1c checked since 2019.  She is had some retinal damage such that she had a hemorrhage behind one of her eyes and is subsequently getting injections in the eye.  Her vision does seem to be improving with these injections.  She is currently being managed by Dr. Ernst Breach and Dr. Katy Fitch.   ROS: Per HPI  Allergies  Allergen Reactions  . Doxycycline Shortness Of Breath and Other (See Comments)    Drug interaction with Soriatane  . Tramadol Shortness Of Breath    Did not work  . Milnacipran     REACTION: dizzy  . Apremilast Rash    Rash, able to take this currently   Past Medical History:  Diagnosis Date  . Allergy   . Anxiety disorder   . Arthritis    bil  legs  . Asthma   . Cancer ([place])   . Chronic bronchitis   . COPD (chronic obstructive pulmonary disease) ([place])   . Depression   . Diverticulitis   . DJD (degenerative joint disease)    spine  . Fibromyalgia   . GERD (gastroesophageal reflux disease)   . Glaucoma   . History of thyroid cancer   . HTN (hypertension)    no longer on BP medication  . Hyperlipidemia   . Impaired glucose tolerance 05/08/2013  . OSA (obstructive sleep apnea)    CPAP  last sleep study 8-10 yr.ag0  . Polymyalgia ([place])   . Psoriasis   . Rhinitis   . Tobacco abuse     Current Outpatient Medications:  .  albuterol (PROVENTIL) (2.5 MG/3ML) 0.083% nebulizer solution, Take 3 mLs (2.5 mg total) by nebulization every 4 (four) hours as needed for wheezing or shortness of breath. DX: 496, Disp: 180 mL, Rfl: 12 .  albuterol (VENTOLIN HFA) 108 (90 Base) MCG/ACT inhaler, Inhale 2 puffs every 4-6 hors as needed- rescue, Disp: 18 g, Rfl: 12 .  aspirin EC 81 MG tablet, Take 81 mg by mouth daily., Disp: , Rfl:  .  atorvastatin (LIPITOR) 80 MG tablet, Take 1 tablet (80 mg total) by mouth daily., Disp: 90 tablet, Rfl: 0 .  budesonide-formoterol (SYMBICORT) 160-4.5 MCG/ACT inhaler, **Note De-Identified Roesler Obfuscation** Inhale 2 puffs then rinse mouth, twice daily, Disp: 1 Inhaler, Rfl: 12 .  colchicine 0.6 MG tablet, Take 1 tablet (0.6 mg total) by mouth daily., Disp: 30 tablet, Rfl: 2 .  FLUoxetine (PROZAC) 20 MG capsule, Take 1 capsule (20 mg total) by mouth daily., Disp: 90 capsule, Rfl: 0 .  fluticasone (FLONASE) 50 MCG/ACT nasal spray, Place 2 sprays into both nostrils daily. (Patient taking differently: Place 2 sprays into both nostrils daily as needed for allergies. ), Disp: 16 g, Rfl: 6 .  folic acid (FOLVITE) 1 MG tablet, Take 1 mg by mouth daily. , Disp: , Rfl:  .  hydrALAZINE (APRESOLINE) 10 MG tablet, Take 1 tablet (10 mg total) by mouth 2 (two) times daily as needed., Disp: 10 tablet, Rfl: 0 .  lisinopril (ZESTRIL) 20 MG tablet, Take 1 tablet (20 mg  total) by mouth daily., Disp: 90 tablet, Rfl: 3 .  methotrexate (RHEUMATREX) 2.5 MG tablet, Take 20 mg by mouth once a week. Monday, Disp: , Rfl:  .  montelukast (SINGULAIR) 10 MG tablet, TAKE 1 TABLET EVERY DAY, Disp: 90 tablet, Rfl: 0 .  Nebulizers (COMPRESSOR/NEBULIZER) MISC, Use up to 4 times daily when needed, Disp: 1 each, Rfl: 0 .  nystatin (MYCOSTATIN/NYSTOP) powder, Apply 1 application topically 3 (three) times daily. (apply to belly and groin folds), Disp: 60 g, Rfl: 0 .  Respiratory Therapy Supplies (FLUTTER) DEVI, Patient to use device for ten minutes, three times daily, Disp: 1 each, Rfl: 0 .  silver sulfADIAZINE (SILVADENE) 1 % cream, Apply 1 application topically daily., Disp: 50 g, Rfl: 0 Social History   Socioeconomic History  . Marital status: Married    Spouse name: Not on file  . Number of children: 3  . Years of education: 9  . Highest education level: Not on file  Occupational History  . Occupation: disabled    Employer: DISABLED  Tobacco Use  . Smoking status: Current Every Day Smoker    Packs/day: 1.00    Years: 45.00    Pack years: 45.00    Types: Cigarettes  . Smokeless tobacco: Never Used  Vaping Use  . Vaping Use: Never used  Substance and Sexual Activity  . Alcohol use: No  . Drug use: No  . Sexual activity: Not on file  Other Topics Concern  . Not on file  Social History Narrative   Married w/ children   Daily caffeine use   Social Determinants of Health   Financial Resource Strain:   . Difficulty of Paying Living Expenses: Not on file  Food Insecurity:   . Worried About Charity fundraiser in the Last Year: Not on file  . Ran Out of Food in the Last Year: Not on file  Transportation Needs:   . Lack of Transportation (Medical): Not on file  . Lack of Transportation (Non-Medical): Not on file  Physical Activity:   . Days of Exercise per Week: Not on file  . Minutes of Exercise per Session: Not on file  Stress:   . Feeling of Stress : Not  on file  Social Connections:   . Frequency of Communication with Friends and Family: Not on file  . Frequency of Social Gatherings with Friends and Family: Not on file  . Attends Religious Services: Not on file  . Active Member of Clubs or Organizations: Not on file  . Attends Archivist Meetings: Not on file  . Marital Status: Not on file  Intimate Partner Violence:   . **Note De-Identified Salo Obfuscation** Fear of Current or Ex-Partner: Not on file  . Emotionally Abused: Not on file  . Physically Abused: Not on file  . Sexually Abused: Not on file   Family History  Problem Relation Age of Onset  . Emphysema Sister   . Hypertension Sister   . Heart attack Sister   . Emphysema Sister   . Alpha-1 antitrypsin deficiency Sister   . Alpha-1 antitrypsin deficiency Sister   . Allergies Sister   . Asthma Sister   . Heart disease Mother   . Cancer Mother   . Hyperlipidemia Mother   . Hypertension Mother   . Heart attack Mother   . Heart disease Father   . Heart attack Father   . Cancer Sister   . Cancer Brother   . Hyperlipidemia Brother   . Hypertension Brother   . Tuberculosis Other        grandmother  . Hyperlipidemia Daughter   . Hypertension Daughter   . Hypertension Son     Objective: Office vital signs reviewed. BP 132/73   Pulse 91   Temp (!) 97.4 F (36.3 Hailey) (Temporal)   Ht _0  (1.651 m)   Wt 216 lb (98 kg)   SpO2 96%   BMI 35.94 kg/m   Physical Examination:  General: Awake, alert, no acute distress.  Chronically ill-appearing HEENT: Normal; sclera white Cardio: regular rate and rhythm, S1S2 heard, no murmurs appreciated Pulm: Decreased breath sounds throughout.  No wheezes, rhonchi or rales.  She has normal work of breathing on 3 L supplemental oxygen by nasal cannula Extremities: warm, well perfused, No edema, cyanosis or clubbing; +2 pulses bilaterally MSK: Slow and antalgic gait and station Skin: dry; intact; no rashes; there is a palpable, well-circumscribed gumball sized,  rubbery mass noted along the right lower pannus.  There is no appreciable pores, appreciable induration, or erythema.  She does have tenderness to palpation to this lesion  Assessment/ Plan: 63 y.o. female   1. Chronic respiratory failure with hypoxia (HCC) Possible advancing fibrosis of the lungs.  2. Psoriasis Improving with current regimen.  Will CC lab results to Dr. Lyman Speller - CMP14+EGFR - CBC with Differential  3. High risk medication use Tapering  - CMP14+EGFR - CBC with Differential  4. Diet-controlled diabetes mellitus (HCC) Check A1c - Bayer DCA Hb A1c Waived  5. Lesion of soft tissue I suspect that this is a cyst based on exam today.  However will check ultrasound to further evaluate and referral to general surgery has been placed for excision given symptomatic nature of this lesion - [place] MiscellaneoUS Localization; Future - Ambulatory referral to General Surgery  No orders of the defined types were placed in this encounter.  No orders of the defined types were placed in this encounter.  [name], DO [place] [phone]

## 2020-08-06 LAB — CMP14+EGFR
ALT: 18 IU/L (ref 0–32)
AST: 28 IU/L (ref 0–40)
Albumin/Globulin Ratio: 1.2 (ref 1.2–2.2)
Albumin: 3.8 g/dL (ref 3.8–4.8)
Alkaline Phosphatase: 177 IU/L — ABNORMAL HIGH (ref 44–121)
BUN/Creatinine Ratio: 16 (ref 12–28)
BUN: 14 mg/dL (ref 8–27)
Bilirubin Total: 0.5 mg/dL (ref 0.0–1.2)
CO2: 31 mmol/L — ABNORMAL HIGH (ref 20–29)
Calcium: 9.4 mg/dL (ref 8.7–10.3)
Chloride: 100 mmol/L (ref 96–106)
Creatinine, Ser: 0.9 mg/dL (ref 0.57–1.00)
GFR calc Af Amer: 77 mL/min/{1.73_m2} (ref >59)
GFR calc non Af Amer: 67 mL/min/{1.73_m2} (ref >59)
Globulin, Total: 3.3 g/dL (ref 1.5–4.5)
Glucose: 101 mg/dL — ABNORMAL HIGH (ref 65–99)
Potassium: 4.9 mmol/L (ref 3.5–5.2)
Sodium: 142 mmol/L (ref 134–144)
Total Protein: 7.1 g/dL (ref 6.0–8.5)

## 2020-08-06 LAB — CBC WITH DIFFERENTIAL/PLATELET
Basophils Absolute: 0 10*3/uL (ref 0.0–0.2)
Basos: 1 %
EOS (ABSOLUTE): 0.2 10*3/uL (ref 0.0–0.4)
Eos: 3 %
Hematocrit: 38.3 % (ref 34.0–46.6)
Hemoglobin: 13.1 g/dL (ref 11.1–15.9)
Immature Grans (Abs): 0 10*3/uL (ref 0.0–0.1)
Immature Granulocytes: 0 %
Lymphocytes Absolute: 1.3 10*3/uL (ref 0.7–3.1)
Lymphs: 20 %
MCH: 34.2 pg — ABNORMAL HIGH (ref 26.6–33.0)
MCHC: 34.2 g/dL (ref 31.5–35.7)
MCV: 100 fL — ABNORMAL HIGH (ref 79–97)
Monocytes Absolute: 0.5 10*3/uL (ref 0.1–0.9)
Monocytes: 8 %
Neutrophils Absolute: 4.5 10*3/uL (ref 1.4–7.0)
Neutrophils: 68 %
Platelets: 136 10*3/uL — ABNORMAL LOW (ref 150–450)
RBC: 3.83 x10E6/uL (ref 3.77–5.28)
RDW: 13.2 % (ref 11.7–15.4)
WBC: 6.6 10*3/uL (ref 3.4–10.8)

## 2020-08-09 ENCOUNTER — Other Ambulatory Visit: Payer: Self-pay | Admitting: Family Medicine

## 2020-08-09 DIAGNOSIS — F419 Anxiety disorder, unspecified: Secondary | ICD-10-CM

## 2020-08-09 DIAGNOSIS — M799 Soft tissue disorder, unspecified: Secondary | ICD-10-CM

## 2020-08-11 DIAGNOSIS — L400 Psoriasis vulgaris: Secondary | ICD-10-CM | POA: Diagnosis not present

## 2020-08-11 DIAGNOSIS — T85511A Breakdown (mechanical) of esophageal anti-reflux device, initial encounter: Secondary | ICD-10-CM | POA: Diagnosis not present

## 2020-08-11 DIAGNOSIS — G4733 Obstructive sleep apnea (adult) (pediatric): Secondary | ICD-10-CM | POA: Diagnosis not present

## 2020-08-11 DIAGNOSIS — J449 Chronic obstructive pulmonary disease, unspecified: Secondary | ICD-10-CM | POA: Diagnosis not present

## 2020-08-11 NOTE — Progress Notes (Signed)
**Note De-Identified Stay Obfuscation** Patient aware - appt made

## 2020-08-12 ENCOUNTER — Ambulatory Visit: Payer: Medicare Other | Admitting: Pharmacist

## 2020-08-12 DIAGNOSIS — M818 Other osteoporosis without current pathological fracture: Secondary | ICD-10-CM

## 2020-08-12 MED ORDER — ALENDRONATE SODIUM 70 MG PO TABS: 70.0000 mg | ORAL_TABLET | ORAL | 11 refills | Status: DC

## 2020-08-12 MED ORDER — CALCIUM CARBONATE 1500 (600 CA) MG PO TABS: 600.0000 mg | ORAL_TABLET | Freq: Two times a day (BID) | ORAL | 3 refills | Status: AC

## 2020-08-12 NOTE — Progress Notes (Signed)
**Note De-identified Mcmurphy Obfuscation**     **Note De-Identified Nipper Obfuscation** 08/12/2020 Name: Hailey Hernandez MRN: [id number] DOB: 1955-11-20   S:  37 yoF Presents for osteoporosis evaluation, education, and management  Current Height:   5'5"     Max Lifetime Height: 5'5" Current Weight:   216lbs      Ethnicity:Caucasian   HPI: Does pt already have a diagnosis of:  Osteopenia?  Yes Osteoporosis?  Yes  Back Pain?  Yes , lower back  Kyphosis?  No Prior fracture?  Yes fracture ribs, feet, toes Med(s) for Osteoporosis/Osteopenia:  n/a Med(s) previously tried for Osteoporosis/Osteopenia:  n/a                                                             PMH: Age at menopause:  Doesn't recall Hysterectomy?  Yes Oophorectomy?  No HRT? No Steroid Use?  No Thyroid med?  No History of cancer?  Yes left thyroid History of digestive disorders (ie Crohn's)?  No, does have GERD  Current or previous eating disorders?  No Last Vitamin D Result:  n/a Last GFR Result:  67 (08/05/20)   FH/SH: Family history of osteoporosis?  Yes mother  Parent with history of hip fracture?  No Family history of breast cancer?  No Exercise?  Yes walks up and down stairs (25 stairs to basement), holds on to railing Smoking?  Yes, 1 ppd Alcohol?  No    Calcium Assessment Calcium Intake  # of servings/day  Calcium mg  Milk (8 oz) 0  x  300  = 0  Yogurt (4 oz) 0 x  200 = 0  Cheese (1 oz) 0.5 x  200 = 0  Other Calcium sources tums 2-3 days regular  1000-1500mg   Ca supplement 0 = 0   Estimated calcium intake per day 1000-1500mg    FRAX 10 year estimate: ASSESSMENT: Patient's diagnostic category is OSTEOPOROSIS by WHO Criteria.  FRACTURE RISK: INCREASED  FRAX: World Health Organization FRAX assessment of absolute fracture risk is not calculated for this patient because the patient has osteoporosis.   Assessment: FINDINGS: AP LUMBAR SPINE L3 and L4  Bone Mineral Density (BMD):  0.899 g/cm2 Young Adult T-Score:  -2.5 Z-Score:  -2.0  LEFT FEMUR NECK Bone Mineral  Density (BMD):  0.682 g/cm2 Young Adult T-Score: -2.6 Z-Score:  -1.8  LEFT FOREARM (1/3 RADIUS) Bone Mineral Density (BMD):  0.724 Young Adult T Score:  -1.8 Z Score:  -0.4  Recommendations: 1.  Start alendronate (FOSAMAX) 70 mg by mouth weekly 2.  recommend calcium 1200mg  daily through supplementation or diet.  3.  recommend weight bearing exercise - 30 minutes at least 4 days per week.   4.  Counseled and educated about fall risk and prevention.  Recheck DEXA:  2 years  Time spent counseling patient:  15 minutes   Regina Eck, PharmD, BCPS Clinical Pharmacist, [place]  II Phone [phone]

## 2020-08-17 ENCOUNTER — Other Ambulatory Visit: Payer: Self-pay | Admitting: Family Medicine

## 2020-08-17 ENCOUNTER — Other Ambulatory Visit: Payer: Self-pay | Admitting: Nurse Practitioner

## 2020-08-17 DIAGNOSIS — I10 Essential (primary) hypertension: Secondary | ICD-10-CM

## 2020-08-18 ENCOUNTER — Ambulatory Visit (HOSPITAL_COMMUNITY)
Admission: RE | Admit: 2020-08-18 | Discharge: 2020-08-18 | Disposition: A | Discharge status: Home / Self Care | Payer: Medicare Other | Source: Ambulatory Visit | Attending: Family Medicine | Admitting: Family Medicine

## 2020-08-18 ENCOUNTER — Other Ambulatory Visit: Payer: Self-pay

## 2020-08-18 ENCOUNTER — Encounter: Payer: Self-pay | Admitting: General Surgery

## 2020-08-18 ENCOUNTER — Ambulatory Visit: Payer: Medicare Other | Admitting: General Surgery

## 2020-08-18 VITALS — BP 116/70 | HR 102 | Temp 98.3°F | Resp 20 | Ht 65.0 in | Wt 218.0 lb

## 2020-08-18 DIAGNOSIS — M799 Soft tissue disorder, unspecified: Secondary | ICD-10-CM | POA: Diagnosis not present

## 2020-08-18 DIAGNOSIS — R1903 Right lower quadrant abdominal swelling, mass and lump: Secondary | ICD-10-CM

## 2020-08-18 NOTE — Progress Notes (Signed)
**Note De-Identified Faciane Obfuscation** [facility] History and Physical  Reason for Referral: Right lower quadrant abdominal mass  Referring Physician:  [name], DO  Chief Complaint    New Patient (Initial Visit)      Hailey Hernandez is a 64 y.o. female.  HPI:  Hailey Hernandez is a 64 yo who comes in with a 1 month yesterday of mass she felt on her right lower quadrant. She has a history of COPD on 3L, GERD, thyroid cancer s/p thyroidectomy, psoriasis.  She says that the area is sore when she rubs it but has not been red or infected previously. She denies any trauma to the area or infections. She has a history of psoriasis under her pannus and has seen dermatology at [facility].   She reports having knots on her right leg at some time and being told they were cyst but they disappeared without removal.   Past Medical History:  Diagnosis Date  . Allergy   . Anxiety disorder   . Arthritis    bil legs  . Asthma   . Cancer ([place])   . Chronic bronchitis   . COPD (chronic obstructive pulmonary disease) ([place])   . Depression   . Diverticulitis   . DJD (degenerative joint disease)    spine  . Fibromyalgia   . GERD (gastroesophageal reflux disease)   . Glaucoma   . History of thyroid cancer   . HTN (hypertension)    no longer on BP medication  . Hyperlipidemia   . Impaired glucose tolerance 05/08/2013  . OSA (obstructive sleep apnea)    CPAP  last sleep study 8-10 yr.ag0  . Polymyalgia ([place])   . Psoriasis   . Rhinitis   . Tobacco abuse     Past Surgical History:  Procedure Laterality Date  . APPENDECTOMY    . CARPAL TUNNEL RELEASE  10/30/2011   Procedure: CARPAL TUNNEL RELEASE;  Surgeon: Wynonia Sours, MD;  Location: [facility];  Service: Orthopedics;  Laterality: Right;  and mass excision  . CHOLECYSTECTOMY    . CHOLESTEATOMA EXCISION     right ear  . left shoulder spurs    . ORIF right lower leg    . partial throidectomy    . TOTAL ABDOMINAL HYSTERECTOMY      Family  History  Problem Relation Age of Onset  . Emphysema Sister   . Hypertension Sister   . Heart attack Sister   . Emphysema Sister   . Alpha-1 antitrypsin deficiency Sister   . Alpha-1 antitrypsin deficiency Sister   . Allergies Sister   . Asthma Sister   . Heart disease Mother   . Cancer Mother   . Hyperlipidemia Mother   . Hypertension Mother   . Heart attack Mother   . Heart disease Father   . Heart attack Father   . Cancer Sister   . Cancer Brother   . Hyperlipidemia Brother   . Hypertension Brother   . Tuberculosis Other        grandmother  . Hyperlipidemia Daughter   . Hypertension Daughter   . Hypertension Son     Social History   Tobacco Use  . Smoking status: Current Every Day Smoker    Packs/day: 1.00    Years: 45.00    Pack years: 45.00    Types: Cigarettes  . Smokeless tobacco: Never Used  Vaping Use  . Vaping Use: Never used  Substance Use Topics  . Alcohol use: No  . Drug **Note De-Identified Gravette Obfuscation** use: No    Medications: I have reviewed the patient's current medications. Allergies as of 08/18/2020      Reactions   Doxycycline Shortness Of Breath, Other (See Comments)   Drug interaction with Soriatane   Tramadol Shortness Of Breath   Did not work   Milnacipran    REACTION: dizzy   Apremilast Rash   Rash, able to take this currently      Medication List       Accurate as of August 18, 2020 11:59 PM. If you have any questions, ask your nurse or doctor.        albuterol 108 (90 Base) MCG/ACT inhaler Commonly known as: VENTOLIN HFA Inhale 2 puffs every 4-6 hors as needed- rescue   albuterol (2.5 MG/3ML) 0.083% nebulizer solution Commonly known as: PROVENTIL Take 3 mLs (2.5 mg total) by nebulization every 4 (four) hours as needed for wheezing or shortness of breath. DX: 496   alendronate 70 MG tablet Commonly known as: FOSAMAX Take 1 tablet (70 mg total) by mouth every 7 (seven) days. Take with a full glass of water on an empty stomach.   aspirin EC 81 MG  tablet Take 81 mg by mouth daily.   atorvastatin 80 MG tablet Commonly known as: LIPITOR TAKE 1 TABLET ONCE DAILY   budesonide-formoterol 160-4.5 MCG/ACT inhaler Commonly known as: Symbicort Inhale 2 puffs then rinse mouth, twice daily   calcium carbonate 1500 (600 Ca) MG Tabs tablet Commonly known as: Caltrate 600 Take 1 tablet (1,500 mg total) by mouth 2 (two) times daily with a meal.   colchicine 0.6 MG tablet TAKE 1 TABLET ONCE DAILY   Compressor/Nebulizer Misc Use up to 4 times daily when needed   FLUoxetine 20 MG capsule Commonly known as: PROZAC TAKE 1 CAPSULE ONCE DAILY   Flutter Devi Patient to use device for ten minutes, three times daily   folic acid 1 MG tablet Commonly known as: FOLVITE Take 1 mg by mouth daily.   hydrALAZINE 10 MG tablet Commonly known as: APRESOLINE Take 1 tablet (10 mg total) by mouth 2 (two) times daily as needed.   lisinopril 20 MG tablet Commonly known as: ZESTRIL TAKE 1 TABLET BY MOUTH DAILY.   methotrexate 2.5 MG tablet Commonly known as: RHEUMATREX Take 20 mg by mouth once a week. Monday   montelukast 10 MG tablet Commonly known as: SINGULAIR TAKE 1 TABLET EVERY DAY   nystatin powder Commonly known as: MYCOSTATIN/NYSTOP Apply 1 application topically 3 (three) times daily. (apply to belly and groin folds)   silver sulfADIAZINE 1 % cream Commonly known as: Silvadene Apply 1 application topically daily.        ROS:  A comprehensive review of systems was negative except for: Respiratory: positive for COPD, 3L O2, SOB Gastrointestinal: positive for reflux symptoms and mass right lower abdomen Integument/breast: positive for psoriasis  Musculoskeletal: positive for back pain, neck pain and joint pain  Blood pressure 116/70, pulse (!) 102, temperature 98.3 F (36.8 C), temperature source Oral, resp. rate 20, height 5\' 5"  (1.651 m), weight 218 lb (98.9 kg), SpO2 92 %. Physical Exam Vitals reviewed.  Constitutional:        Appearance: She is obese.  HENT:     Head: Normocephalic.     Nose: Nose normal.  Eyes:     Extraocular Movements: Extraocular movements intact.  Neck:     Comments: Thyroidectomy incision lower neck, no adenopathy appreciated Cardiovascular:     Rate and Rhythm: Normal rate. **Note De-Identified Martos Obfuscation** Pulmonary:     Effort: Pulmonary effort is normal.  Abdominal:     General: There is no distension.     Palpations: Abdomen is soft.     Tenderness: There is abdominal tenderness.     Comments: RUQ incision from cholecystectomy, RLQ with hard superficial mass about 1.5 in size, tender, no skin changes overlying  Musculoskeletal:     Right lower leg: Edema present.     Left lower leg: Edema present.  Skin:    General: Skin is warm.  Neurological:     General: No focal deficit present.     Mental Status: She is alert and oriented to person, place, and time.  Psychiatric:        Mood and Affect: Mood normal.        Behavior: Behavior normal.        Thought Content: Thought content normal.        Judgment: Judgment normal.     Results: No results found for this or any previous visit (from the past 48 hour(s)).  US Pelvis Limited  Result Date: 08/18/2020 CLINICAL DATA:  Painful, palpable right lower quadrant mass for 1 month. History of psoriasis. EXAM: LIMITED ULTRASOUND OF PELVIS TECHNIQUE: Limited transabdominal ultrasound examination of the pelvis was performed. COMPARISON:  None. FINDINGS: Targeted ultrasound of the right lower quadrant in the area of palpable concern demonstrates a 16 x 10 x 15 mm subcutaneous mass. This is heterogeneous with mixed echogenicity including possible small internal cystic spaces. No internal vascularity is demonstrated on color Doppler imaging. IMPRESSION: 16 mm subcutaneous mass in the right lower quadrant, nonspecific. While no internal vascularity is evident, the mass is complex in appearance and may be partially solid and partially cystic with inflammatory,  infectious, and neoplastic etiologies all possible. Per the electronic medical record, the patient has been referred to general surgery for consideration of excision. Electronically Signed   By: Logan Bores M.D.   On: 08/18/2020 14:24     Assessment & Plan:  Teresina Bugaj Vick is a 64 y.o. female with a hard mass in the right lower quadrant of her abdomen. She has a history of thyroid cancer and psoriasis, COPD on 3L.  She reports having a colonoscopy in the past and not being due at this time. I am worried with how hard this lesion is that it may be a metastatic area from a cancer. The [place] was not back prior to her leaving clinic and I called to discuss with her (see phone note).   -Plan for excision under local and discussed risk of bleeding, infection, finding something that will need more work up/ cancer   All questions were answered to the satisfaction of the patient and family.    Hailey Hernandez 08/19/2020, 9:19 AM

## 2020-08-18 NOTE — Patient Instructions (Signed)
**Note De-Identified [place] Obfuscation** Will call with results and discuss options.

## 2020-08-19 ENCOUNTER — Telehealth (INDEPENDENT_AMBULATORY_CARE_PROVIDER_SITE_OTHER): Payer: Medicare Other | Admitting: General Surgery

## 2020-08-19 DIAGNOSIS — R1903 Right lower quadrant abdominal swelling, mass and lump: Secondary | ICD-10-CM

## 2020-08-19 NOTE — Telephone Encounter (Signed)
**Note De-Identified [place] Obfuscation** [facility]  Discussed the [place] results with the patient. Area is nonspecific and could be inflammatory, infectious or neoplastic. I am concerned about it being neoplastic, and we discussed this in the office.  Discussed option of CT for further imaging versus excision to get a diagnosis and discussed possible need for further testing after this excision. Discussed risk of bleeding, infection, finding cancer.   She wants to remove the area and go that route. Will get her scheduled in the office under local.   Curlene Labrum, MD [facility] [address] [place], [place] [address] [phone] (office)

## 2020-09-01 ENCOUNTER — Ambulatory Visit (INDEPENDENT_AMBULATORY_CARE_PROVIDER_SITE_OTHER): Payer: Medicare Other | Admitting: General Surgery

## 2020-09-01 ENCOUNTER — Encounter: Payer: Self-pay | Admitting: General Surgery

## 2020-09-01 ENCOUNTER — Other Ambulatory Visit: Payer: Self-pay

## 2020-09-01 ENCOUNTER — Other Ambulatory Visit: Payer: Self-pay | Admitting: Family Medicine

## 2020-09-01 VITALS — BP 142/65 | HR 86 | Temp 98.2°F | Resp 20 | Ht 65.0 in | Wt 216.0 lb

## 2020-09-01 DIAGNOSIS — D492 Neoplasm of unspecified behavior of bone, soft tissue, and skin: Secondary | ICD-10-CM

## 2020-09-01 DIAGNOSIS — R222 Localized swelling, mass and lump, trunk: Secondary | ICD-10-CM

## 2020-09-01 DIAGNOSIS — R1903 Right lower quadrant abdominal swelling, mass and lump: Secondary | ICD-10-CM | POA: Diagnosis not present

## 2020-09-01 MED ORDER — HYDROCODONE-ACETAMINOPHEN 5-325 MG PO TABS: 1.0000 | ORAL_TABLET | Freq: Four times a day (QID) | ORAL | 0 refills | Status: DC | PRN

## 2020-09-01 NOTE — Patient Instructions (Addendum)
**Note De-Identified Laabs Obfuscation** Keep area clean and dry. Ok to shower. Do not pick at glue. Stitches dissolve. Tylenol and ibuprofen for pain. Norco for breakthrough pain. Call with questions or concerns. Will call with results next week from pathology.

## 2020-09-02 ENCOUNTER — Other Ambulatory Visit: Payer: Self-pay | Admitting: General Surgery

## 2020-09-02 DIAGNOSIS — D175 Benign lipomatous neoplasm of intra-abdominal organs: Secondary | ICD-10-CM | POA: Diagnosis not present

## 2020-09-02 NOTE — Progress Notes (Signed)
**Note De-Identified Thang Obfuscation** [facility] Procedure Note  09/02/20  Preoperative Diagnosis:  Right lower abdominal wall mass    Postoperative Diagnosis: Same   Procedure(s) Performed:  Excisional biopsy of right lower abdominal wall mass 2cm    Surgeon: Lanell Matar. Constance Haw, MD   Anesthesia: 1% Xylocaine with epinephrine    Specimens:  Hard mass    Estimated Blood Loss: Minimal   Blood Replacement: None    Complications: None   Wound Class: Clean    Operative Indications: Hailey Hernandez is a 64 yo with a newly appreciated hard mass in the right lower abdomen that has been worked up with [place] and demonstrated a 2cm area that could be concerning for infection, inflammation versus neoplasm. The area is very hard and superficial. I discussed with her the option of excisional biopsy versus further imaging with CT, and she opted to biopsy.  We discussed the risk of bleeding, infection, not improving her pain, and risk of finding something like cancer due to the characteristics of the lesion.   Findings: Firm nodule that was tan in color, 2cm    Procedure: The patient was taken to the procedure room and placed semi-supine. The right lower abdomen was prepped with betadine and local anesthetic was placed into the area. An incision was made in the skin over the harden nodule and carried down through to the adipose tissue. The lesion was felt and grasped with an Alis. This was excised with blunt and sharp dissection with scissors. Hemostasis was achieved. The skin was closed with interrupted 4-0 Monocryl sutures and dermabond.   A prescription for Norco was sent to the pharmacy for breakthrough pain control. The specimen was sent to pathology and I will call her with the results.    Hailey Labrum, MD [facility] [address] [place],  [address] [phone] (office)

## 2020-09-06 DIAGNOSIS — G4733 Obstructive sleep apnea (adult) (pediatric): Secondary | ICD-10-CM | POA: Diagnosis not present

## 2020-09-10 DIAGNOSIS — J449 Chronic obstructive pulmonary disease, unspecified: Secondary | ICD-10-CM | POA: Diagnosis not present

## 2020-09-10 DIAGNOSIS — L400 Psoriasis vulgaris: Secondary | ICD-10-CM | POA: Diagnosis not present

## 2020-09-10 DIAGNOSIS — G4733 Obstructive sleep apnea (adult) (pediatric): Secondary | ICD-10-CM | POA: Diagnosis not present

## 2020-09-10 DIAGNOSIS — T85511A Breakdown (mechanical) of esophageal anti-reflux device, initial encounter: Secondary | ICD-10-CM | POA: Diagnosis not present

## 2020-09-12 ENCOUNTER — Telehealth (INDEPENDENT_AMBULATORY_CARE_PROVIDER_SITE_OTHER): Payer: Medicare Other | Admitting: General Surgery

## 2020-09-12 DIAGNOSIS — D1730 Benign lipomatous neoplasm of skin and subcutaneous tissue of unspecified sites: Secondary | ICD-10-CM

## 2020-09-12 NOTE — Telephone Encounter (Signed)
**Note De-Identified Hogans Obfuscation** [facility]  Results of biopsy back.   Diagnosis Soft tissue, biopsy, right lower abdominal wall hard mass ANGIOLIPOMA WITH FAT NECROSIS (TRAUMATIZED), SEE COMMENT. Microscopic Comment This case has been reviewed by Dr. Saralyn Pilar. (MJ:kh 09/09/20)  Benign process. Pain is somewhat better. These can be painful lesions.  Incisions are healing well. Follow up PRN.   Curlene Labrum, MD [facility] [address] [place], [place] [address] [phone] (office)

## 2020-09-16 ENCOUNTER — Other Ambulatory Visit: Payer: Self-pay | Admitting: Family Medicine

## 2020-09-16 DIAGNOSIS — J9611 Chronic respiratory failure with hypoxia: Secondary | ICD-10-CM

## 2020-09-16 DIAGNOSIS — J410 Simple chronic bronchitis: Secondary | ICD-10-CM

## 2020-10-06 NOTE — Progress Notes (Deleted)
**Note De-Identified Cathers Obfuscation** Patient ID: Hailey Hernandez, female    DOB: December 01, 1955, 65 y.o.   MRN: [id number]  HPI female smoker followed for chronic bronchitis/COPD, chronic hypoxic respiratory failure, tobacco use (1 PPD/45 pack years), OSA, obesity hypoventilation, complicated by psoriasis O2 2-3 L sleep/exertion, CPAP 13/[employer] Office Spirometry-06/07/17-severe obstructive airways disease, severe restriction of exhaled volume. FVC 1.18/37%, FEV1 0.90/36%, ratio 0.76, FEF 25-75% 0.73/33% -----------------------------------------------------------------------------------------   06/04/20- 65 year old female smoker  followed for chronic bronchitis/COPD, chronic hypoxic respiratory failure, tobacco use (1 PPD/45 pack years), OSA, obesity hypoventilation, complicated by psoriasis/ MTX, DM2,  O2 2-3 L sleep/exertion, CPAP 13/[employer] 2 sisters died a1AT def/ breast cancer                                  Husband here ------copd,sob with exertion, nonproductive cough Flutter device, Symbicort 160, albuterol hfa, neb albuterol,        MTX Had Johnson covax Denies significant change. Stable cough, mostly dry or scant sputum, no blood.  Uses nebulizer 1-2x/ day. Relative gave her some Symbicort so doesn't need that filled yet. Smoking 1-2 ppd-- discussed again.  CXR 11/03/18- IMPRESSION: 1. Significantly improved right middle lobe pneumonia. 2. Mild left basilar atelectasis. 3. Mild bronchitic changes with progression.  10/07/20- 65 year old female smoker  followed for chronic bronchitis/COPD, chronic hypoxic respiratory failure, tobacco use (1 PPD/45 pack years), OSA, obesity hypoventilation, complicated by psoriasis/ MTX, DM2,  O2 2-3 L sleep/exertion, CPAP 13/[employer] 2 sisters died a1AT def/ breast cancer      Surgery 09/01/20- excision hard mass R abdominal wall >> Angiolipoma Ventolin hfa, Symbicort 160, neb albuterol     Methotrexate Covid vax- Flu vax-  CXR 06/03/20-  IMPRESSION: Progressive diffuse prominence of the  interstitial markings, likely progressive chronic interstitial lung disease. Consider short-term radiographic follow-up and/or high-resolution chest CT for further Evaluation. HRCT 12/21/17-   2019 IMPRESSION: 1. No findings to explain the patient's symptoms. No evidence of interstitial lung disease. 2. Scattered pulmonary nodules measure 5 mm or less in size. No follow-up needed if patient is low-risk (and has no known or suspected primary neoplasm). Non-contrast chest CT can be considered in 12 months if patient is high-risk. This recommendation follows the consensus statement: Guidelines for Management of Incidental Pulmonary Nodules Detected on CT Images: From the Fleischner Society 2017; Radiology 2017; 284:228-243. 3. Aortic atherosclerosis (ICD10-170.0). Three-vessel coronary artery calcification. 4. Pulmonary arteries are enlarged, indicative of pulmonary arterial hypertension. 5. Liver appears enlarged but is incompletely imaged. Enlarged porta hepatis lymph node is likely related.  Review of Systems-see HPI   + = positive Constitutional:   No-   weight loss, night sweats, fevers, chills, fatigue, lassitude. HEENT:    headaches, difficulty swallowing, tooth/dental problems, sore throat,        sneezing, itching,  ear ache,  +nasal congestion, +post nasal drip,  CV:  No-   chest pain, orthopnea, PND, swelling in lower extremities, anasarca,   dizziness, palpitations Resp: +  shortness of breath with exertion or at rest.               productive cough,  + non-productive cough,  No- coughing up of blood.               change in color of mucus.  + wheezing.   Skin: +psoriasis GI:  No-   heartburn, indigestion, abdominal pain, nausea, vomiting,  GU: No-    MS:  + **Note De-Identified Cadotte Obfuscation** joint pain or swelling. . Neuro-     nothing unusual Psych:  No- change in mood or affect. No depression or anxiety.  No memory loss.   Objective:   Physical Exam General- Alert, Oriented, Affect-appropriate,  Distress- none acute, +obese.  On 3L POC O2 sat 95% Skin- +extensive psoriasis plaques Lymphadenopathy- none Head- atraumatic            Eyes- Gross vision intact, PERRLA, conjunctivae clear secretions            Ears- Hearing ok            Nose-  no-Septal dev, mucus, polyps, erosion, perforation             Throat- Mallampati III , mucosa clear , drainage- none, tonsils- atrophic.                        +dentures Neck- flexible , trachea midline, no stridor , thyroid nl, carotid no bruit Chest - symmetrical excursion , unlabored           Heart/CV- RRR , no murmur , no gallop  , no rub, nl s1 s2                           - JVD- none , edema- none, stasis dermatitis changes+ bilateral,                                 varices- none           Lung- Diminished,  Wheeze- none, dullness-none,                                                    rub- none, , cough + loose           Chest wall-  Abd-  Br/ Gen/ Rectal- Not done, not indicated Extrem- +apparent lipoma right medial knee with +stasis changes,  Neuro- grossly intact to observation

## 2020-10-07 ENCOUNTER — Ambulatory Visit: Payer: Medicare Other | Admitting: Internal Medicine

## 2020-10-11 DIAGNOSIS — G4733 Obstructive sleep apnea (adult) (pediatric): Secondary | ICD-10-CM | POA: Diagnosis not present

## 2020-10-11 DIAGNOSIS — T85511A Breakdown (mechanical) of esophageal anti-reflux device, initial encounter: Secondary | ICD-10-CM | POA: Diagnosis not present

## 2020-10-11 DIAGNOSIS — L400 Psoriasis vulgaris: Secondary | ICD-10-CM | POA: Diagnosis not present

## 2020-10-11 DIAGNOSIS — J449 Chronic obstructive pulmonary disease, unspecified: Secondary | ICD-10-CM | POA: Diagnosis not present

## 2020-11-07 ENCOUNTER — Ambulatory Visit: Payer: Medicare Other | Admitting: Family Medicine

## 2020-11-10 ENCOUNTER — Other Ambulatory Visit: Payer: Self-pay | Admitting: Family Medicine

## 2020-11-10 DIAGNOSIS — I10 Essential (primary) hypertension: Secondary | ICD-10-CM

## 2020-11-10 DIAGNOSIS — F419 Anxiety disorder, unspecified: Secondary | ICD-10-CM

## 2020-11-11 DIAGNOSIS — T85511A Breakdown (mechanical) of esophageal anti-reflux device, initial encounter: Secondary | ICD-10-CM | POA: Diagnosis not present

## 2020-11-11 DIAGNOSIS — J449 Chronic obstructive pulmonary disease, unspecified: Secondary | ICD-10-CM | POA: Diagnosis not present

## 2020-11-11 DIAGNOSIS — L400 Psoriasis vulgaris: Secondary | ICD-10-CM | POA: Diagnosis not present

## 2020-11-11 DIAGNOSIS — G4733 Obstructive sleep apnea (adult) (pediatric): Secondary | ICD-10-CM | POA: Diagnosis not present

## 2020-11-14 DIAGNOSIS — H35033 Hypertensive retinopathy, bilateral: Secondary | ICD-10-CM | POA: Diagnosis not present

## 2020-11-14 DIAGNOSIS — H43813 Vitreous degeneration, bilateral: Secondary | ICD-10-CM | POA: Diagnosis not present

## 2020-11-14 DIAGNOSIS — H35362 Drusen (degenerative) of macula, left eye: Secondary | ICD-10-CM | POA: Diagnosis not present

## 2020-11-14 DIAGNOSIS — H34831 Tributary (branch) retinal vein occlusion, right eye, with macular edema: Secondary | ICD-10-CM | POA: Diagnosis not present

## 2020-11-22 ENCOUNTER — Other Ambulatory Visit: Payer: Self-pay | Admitting: Family Medicine

## 2020-12-01 ENCOUNTER — Encounter: Payer: Self-pay | Admitting: Family Medicine

## 2020-12-06 DIAGNOSIS — G4733 Obstructive sleep apnea (adult) (pediatric): Secondary | ICD-10-CM | POA: Diagnosis not present

## 2020-12-09 DIAGNOSIS — L400 Psoriasis vulgaris: Secondary | ICD-10-CM | POA: Diagnosis not present

## 2020-12-09 DIAGNOSIS — T85511A Breakdown (mechanical) of esophageal anti-reflux device, initial encounter: Secondary | ICD-10-CM | POA: Diagnosis not present

## 2020-12-09 DIAGNOSIS — J449 Chronic obstructive pulmonary disease, unspecified: Secondary | ICD-10-CM | POA: Diagnosis not present

## 2020-12-09 DIAGNOSIS — G4733 Obstructive sleep apnea (adult) (pediatric): Secondary | ICD-10-CM | POA: Diagnosis not present

## 2020-12-13 ENCOUNTER — Encounter: Payer: Self-pay | Admitting: Internal Medicine

## 2020-12-15 NOTE — Progress Notes (Signed)
**Note De-Identified [place]ve Obfuscation** Patient ID: Hailey Hernandez, female    DOB: 1956-05-31, 65 y.o.   MRN: [id number]  HPI female smoker followed for chronic bronchitis/COPD, chronic hypoxic respiratory failure, tobacco use (1 PPD/45 pack years), OSA, obesity hypoventilation, complicated by psoriasis O2 2-3 L sleep/exertion, CPAP 13/[employer] Office Spirometry-06/07/17-severe obstructive airways disease, severe restriction of exhaled volume. FVC 1.18/37%, FEV1 0.90/36%, ratio 0.76, FEF 25-75% 0.73/33% -----------------------------------------------------------------------------------------   06/04/20- 65 year old female smoker  followed for chronic bronchitis/COPD, chronic hypoxic respiratory failure, tobacco use (1 PPD/45 pack years), OSA, obesity hypoventilation, complicated by psoriasis/ MTX, DM2,  O2 2-3 L sleep/exertion, CPAP 13/[employer] 2 sisters died a1AT def/ breast cancer                                  Husband here ------copd,sob with exertion, nonproductive cough Flutter device, Symbicort 160, albuterol hfa, neb albuterol,        MTX Had Johnson covax Denies significant change. Stable cough, mostly dry or scant sputum, no blood.  Uses nebulizer 1-2x/ day. Relative gave her some Symbicort so doesn't need that filled yet. Smoking 1-2 ppd-- discussed again.  CXR 11/03/18- IMPRESSION: 1. Significantly improved right middle lobe pneumonia. 2. Mild left basilar atelectasis. 3. Mild bronchitic changes with progression.  12/16/20- 65 year old female smoker  followed for chronic bronchitis/COPD, chronic hypoxic respiratory failure, tobacco use (1 PPD/45 pack years), OSA, obesity hypoventilation, complicated by psoriasis, DM2,  -Flutter device, Symbicort 160, albuterol hfa, neb albuterol -O2 2-3 L sleep/exertion, CPAP 5-15/[employer] Download- compliance 100%, AHI 0.7/ hr                   Daughter here 2 sisters died with a1AT def,     Body weight today-211 lbs Covid vax-- J&J, 1 Moderna Flu vax-had Had angiolipoma resected from  abdominal wall. -----Breathing not good, gets winded easily Arrival sat 93% on 3L Uses Symbicort but not rescue hfa or nebulizer.  More dyspnea and chest congestion x 3 weeks, some yellow sputum but no fever or blood. She wouldn't go to doctor, so daughter gave her left-over doxycycline and prednisone- seemed to help but not back to baseline.  Uses CPAP anytime she sleeps- no problems. Discussed CXR., smoking, nebulizer use.  No longer on MTX for psoriasis, being treated in [place]  CXR 06/03/20- IMPRESSION: Progressive diffuse prominence of the interstitial markings, likely progressive chronic interstitial lung disease. Consider short-term radiographic follow-up and/or high-resolution chest CT for further evaluation.  Review of Systems-see HPI   + = positive Constitutional:   No-   weight loss, night sweats, fevers, chills, fatigue, lassitude. HEENT:    headaches, difficulty swallowing, tooth/dental problems, sore throat,        sneezing, itching,  ear ache,  +nasal congestion, +post nasal drip,  CV:  No-   chest pain, orthopnea, PND, swelling in lower extremities, anasarca,   dizziness, palpitations Resp: +  shortness of breath with exertion or at rest.               +productive cough,  + non-productive cough,  No- coughing up of blood.              change in color of mucus.  + wheezing.   Skin: +psoriasis GI:  No-   heartburn, indigestion, abdominal pain, nausea, vomiting,  GU: No-    MS:  + joint pain or swelling. . Neuro-     nothing unusual Psych:  No- **Note De-Identified Ninh Obfuscation** change in mood or affect. No depression or anxiety.  No memory loss.   Objective:   Physical Exam General- Alert, Oriented, Affect-appropriate, Distress- none acute, +obese.  On 3L POC O2 sat 93% Skin- + psoriasis plaques Lymphadenopathy- none Head- atraumatic            Eyes- Gross vision intact, PERRLA, conjunctivae clear secretions            Ears- Hearing ok            Nose-  no-Septal dev, mucus, polyps, erosion,  perforation             Throat- Mallampati III , mucosa clear , drainage- none, tonsils- atrophic.                        +dentures Neck- flexible , trachea midline, no stridor , thyroid nl, carotid no bruit Chest - symmetrical excursion , unlabored           Heart/CV- RRR , no murmur , no gallop  , no rub, nl s1 s2                           - JVD- none , edema- none, stasis dermatitis changes+ bilateral,                                 varices- none           Lung- +Diminished,  Wheeze- none, dullness-none,                                                    rub- none, , cough + loose           Chest wall-  Abd-  Br/ Gen/ Rectal- Not done, not indicated Extrem- +apparent lipoma right medial knee with +stasis changes,  Neuro- grossly intact to observation

## 2020-12-16 ENCOUNTER — Ambulatory Visit: Payer: Medicare Other | Admitting: Internal Medicine

## 2020-12-16 ENCOUNTER — Ambulatory Visit: Payer: Medicare Other | Admitting: Family Medicine

## 2020-12-16 ENCOUNTER — Other Ambulatory Visit: Payer: Self-pay

## 2020-12-16 ENCOUNTER — Encounter: Payer: Self-pay | Admitting: Internal Medicine

## 2020-12-16 DIAGNOSIS — G4733 Obstructive sleep apnea (adult) (pediatric): Secondary | ICD-10-CM | POA: Diagnosis not present

## 2020-12-16 DIAGNOSIS — J449 Chronic obstructive pulmonary disease, unspecified: Secondary | ICD-10-CM | POA: Diagnosis not present

## 2020-12-16 DIAGNOSIS — J849 Interstitial pulmonary disease, unspecified: Secondary | ICD-10-CM

## 2020-12-16 DIAGNOSIS — J9611 Chronic respiratory failure with hypoxia: Secondary | ICD-10-CM | POA: Diagnosis not present

## 2020-12-16 MED ORDER — BREZTRI AEROSPHERE 160-9-4.8 MCG/ACT IN AERO: 2.0000 | INHALATION_SPRAY | Freq: Two times a day (BID) | RESPIRATORY_TRACT | 0 refills | Status: DC

## 2020-12-16 NOTE — Assessment & Plan Note (Addendum)
**Note De-Identified Jonsson Obfuscation** Recent bronchitic exacerbation seems to be settling back down after doxy and prednisone.  More prominent interstitial markings on CXR- very nonspecific- could be from smoking/ COPD, from MTX, or a developing pulmonary fibrosis Plan- They will call if further problems. Try samples Breztri instead of Symbicort. Ok to use nebulizer more if needed.           Schedule HRCT

## 2020-12-16 NOTE — Patient Instructions (Signed)
**Note De-Identified Formby Obfuscation** Order- Sample 3 Breztri    Inhale 2 puffs then rinse mouth, twice daily.  Try this instead of Symbicort. If you like it better, let us know.  Order- Schedule HRCT chest   Dx ILD protocol  Continue CPAP 13  Continue oxygen 3L  Please call if we can help

## 2020-12-16 NOTE — Addendum Note (Signed)
**Note De-Identified Burdine Obfuscation** Addended byCoralie Keens on: 12/16/2020 12:23 PM   Modules accepted: Orders

## 2020-12-16 NOTE — Assessment & Plan Note (Signed)
**Note De-Identified Hark Obfuscation** Remains dependent on O2. No change

## 2020-12-16 NOTE — Assessment & Plan Note (Signed)
**Note De-Identified Street Obfuscation** Benefits from CPAP with good compliance and control Plan continue auto 5-15 with O2 bleed-in 3L during sleep

## 2020-12-28 ENCOUNTER — Other Ambulatory Visit: Payer: Self-pay | Admitting: Family Medicine

## 2020-12-28 DIAGNOSIS — J410 Simple chronic bronchitis: Secondary | ICD-10-CM

## 2020-12-28 DIAGNOSIS — J9611 Chronic respiratory failure with hypoxia: Secondary | ICD-10-CM

## 2020-12-30 ENCOUNTER — Ambulatory Visit
Admission: RE | Admit: 2020-12-30 | Discharge: 2020-12-30 | Disposition: A | Discharge status: Home / Self Care | Payer: Medicare Other | Source: Ambulatory Visit | Attending: Internal Medicine | Admitting: Internal Medicine

## 2020-12-30 DIAGNOSIS — R918 Other nonspecific abnormal finding of lung field: Secondary | ICD-10-CM | POA: Diagnosis not present

## 2020-12-30 DIAGNOSIS — J849 Interstitial pulmonary disease, unspecified: Secondary | ICD-10-CM

## 2021-01-03 ENCOUNTER — Telehealth: Payer: Self-pay | Admitting: Internal Medicine

## 2021-01-03 DIAGNOSIS — R918 Other nonspecific abnormal finding of lung field: Secondary | ICD-10-CM

## 2021-01-03 NOTE — Telephone Encounter (Signed)
**Note De-Identified Kluth Obfuscation** Called and spoke with patient to go over CT Results and to let her know that Dr. Annamaria Boots wants to order PET scan to get better picture of nodules and then we would touch base after. She expressed understanding. Nothing further needed at this time.

## 2021-01-03 NOTE — Telephone Encounter (Signed)
**Note De-Identified Starnes Obfuscation** CT chest - There are several lung nodules. To help [place] better understand what this is, we need to order a PET scan.  Please order PET scan neck to thigh- dx multiple lung nodules

## 2021-01-03 NOTE — Telephone Encounter (Signed)
**Note De-Identified Satchell Obfuscation** Do I need to call the patient to let her know of results or just order PET scan?

## 2021-01-03 NOTE — Telephone Encounter (Signed)
**Note De-Identified Knouff Obfuscation** Please call her- use the wording in my order- several nodules, need further evaluation.

## 2021-01-03 NOTE — Telephone Encounter (Signed)
**Note De-Identified Brod Obfuscation** Received call report from [place] with [facility] on patient's High Res CT done on 01/02/21. CY please review the result/impression copied below:  IMPRESSION: 1. No findings to suggest interstitial lung disease. 2. Numerous pulmonary nodules scattered throughout the lungs bilaterally concerning for potential metastatic disease, although atypical infection is difficult to exclude. The largest of these is a cavitary left upper lobe nodule measuring 2.7 x 2.0 x 2.2 cm. Further clinical evaluation is recommended. Follow-up PET-CT should be considered if clinically appropriate. 3. Dilatation of the pulmonic trunk (3.5 cm in diameter), concerning for pulmonary arterial hypertension. 4. Probable cirrhosis with hepatic steatosis and hepatosplenomegaly. 5. Cardiomegaly. 6. There are calcifications of the aortic valve and mitral annulus. Echocardiographic correlation for evaluation of potential valvular dysfunction may be warranted if clinically indicated. 7. Aortic atherosclerosis, in addition to left main and 3 vessel coronary artery disease. Please note that although the presence of coronary artery calcium documents the presence of coronary artery disease, the severity of this disease and any potential stenosis cannot be assessed on this non-gated CT examination. Assessment for potential risk factor modification, dietary therapy or pharmacologic therapy may be warranted, if clinically indicated.  Please advise, thank you.

## 2021-01-09 ENCOUNTER — Other Ambulatory Visit: Payer: Self-pay

## 2021-01-09 ENCOUNTER — Encounter (HOSPITAL_COMMUNITY)
Admission: RE | Admit: 2021-01-09 | Discharge: 2021-01-09 | Disposition: A | Discharge status: Home / Self Care | Payer: Medicare Other | Source: Ambulatory Visit | Attending: Internal Medicine | Admitting: Internal Medicine

## 2021-01-09 DIAGNOSIS — L400 Psoriasis vulgaris: Secondary | ICD-10-CM | POA: Diagnosis not present

## 2021-01-09 DIAGNOSIS — T85511A Breakdown (mechanical) of esophageal anti-reflux device, initial encounter: Secondary | ICD-10-CM | POA: Diagnosis not present

## 2021-01-09 DIAGNOSIS — G4733 Obstructive sleep apnea (adult) (pediatric): Secondary | ICD-10-CM | POA: Diagnosis not present

## 2021-01-09 DIAGNOSIS — J449 Chronic obstructive pulmonary disease, unspecified: Secondary | ICD-10-CM | POA: Diagnosis not present

## 2021-01-09 DIAGNOSIS — R918 Other nonspecific abnormal finding of lung field: Secondary | ICD-10-CM | POA: Diagnosis not present

## 2021-01-09 MED ORDER — FLUDEOXYGLUCOSE F - 18 (FDG) INJECTION
12.0300 | Freq: Once | INTRAVENOUS | Status: AC | PRN
  Administered 2021-01-09: 12.03 via INTRAVENOUS

## 2021-01-11 ENCOUNTER — Telehealth: Payer: Self-pay | Admitting: Internal Medicine

## 2021-01-11 NOTE — Telephone Encounter (Signed)
**Note De-Identified Lorensen Obfuscation** Patient and patient's daughter is calling back for PET scan results.

## 2021-01-11 NOTE — Telephone Encounter (Signed)
**Note De-Identified Bougher Obfuscation** Tried home and mobile numbers to talk with her about PET results. Reached her husband who will try to reacch her to call me back.

## 2021-01-12 NOTE — Telephone Encounter (Signed)
**Note De-Identified Luz Obfuscation** CY pt is calling for the PET scan results.  Please advise. Thanks

## 2021-01-12 NOTE — Telephone Encounter (Signed)
**Note De-Identified Huitron Obfuscation** [phone] pt calling back for results

## 2021-01-16 NOTE — Telephone Encounter (Signed)
**Note De-Identified Viglione Obfuscation** I have notified Hailey Hernandez that her PET scan is abnormal and most likely indicates a lung cancer.  I will be discussing with my partners.

## 2021-01-17 ENCOUNTER — Other Ambulatory Visit: Payer: Self-pay | Admitting: Internal Medicine

## 2021-01-17 DIAGNOSIS — C3412 Malignant neoplasm of upper lobe, left bronchus or lung: Secondary | ICD-10-CM

## 2021-01-17 NOTE — Telephone Encounter (Signed)
**Note De-Identified Rudden Obfuscation** I have scheduled pt for Bronch/EBUS on 4/26 at 11:25 at [facility].  Covid test scheduled for 4/25.  I spoke to pt & her dtr and gave them appt info.

## 2021-01-17 NOTE — Progress Notes (Unsigned)
**Note De-Identified Huxtable Obfuscation** Patient needs schedule for bronchoscopy with EBUS - with anesthesia.

## 2021-01-23 ENCOUNTER — Other Ambulatory Visit (HOSPITAL_COMMUNITY)
Admission: RE | Admit: 2021-01-23 | Discharge: 2021-01-23 | Disposition: A | Discharge status: Home / Self Care | Payer: Medicare Other | Source: Ambulatory Visit | Attending: Internal Medicine | Admitting: Internal Medicine

## 2021-01-23 ENCOUNTER — Encounter (HOSPITAL_COMMUNITY): Payer: Self-pay | Admitting: Internal Medicine

## 2021-01-23 DIAGNOSIS — Z01812 Encounter for preprocedural laboratory examination: Secondary | ICD-10-CM | POA: Diagnosis not present

## 2021-01-23 DIAGNOSIS — Z20822 Contact with and (suspected) exposure to covid-19: Secondary | ICD-10-CM | POA: Diagnosis not present

## 2021-01-23 NOTE — Progress Notes (Signed)
**Note De-Identified Votaw Obfuscation** Spoke with pt and her husband, Fritz Pickerel for pre-op call. Pt denies cardiac history but is treated for HTN. Pt states she is pre-diabetic. Last A1C was 4.6 on 08/05/20.  Covid test done today, result pending. Pt states she's been in quarantine since the test was done and understands that she stays in quarantine until she comes to the hospital tomorrow.

## 2021-01-24 ENCOUNTER — Ambulatory Visit (HOSPITAL_COMMUNITY): Payer: Medicare Other

## 2021-01-24 ENCOUNTER — Ambulatory Visit (HOSPITAL_COMMUNITY): Payer: Medicare Other | Admitting: Anesthesiology

## 2021-01-24 ENCOUNTER — Other Ambulatory Visit: Payer: Self-pay

## 2021-01-24 ENCOUNTER — Ambulatory Visit (HOSPITAL_COMMUNITY)
Admission: RE | Admit: 2021-01-24 | Discharge: 2021-01-24 | Disposition: A | Discharge status: Home / Self Care | Payer: Medicare Other | Attending: Internal Medicine | Admitting: Internal Medicine

## 2021-01-24 ENCOUNTER — Encounter (HOSPITAL_COMMUNITY)
Admission: RE | Disposition: A | Discharge status: Home / Self Care | Payer: Self-pay | Source: Home / Self Care | Attending: Internal Medicine

## 2021-01-24 ENCOUNTER — Encounter (HOSPITAL_COMMUNITY): Payer: Self-pay | Admitting: Internal Medicine

## 2021-01-24 DIAGNOSIS — R7303 Prediabetes: Secondary | ICD-10-CM | POA: Insufficient documentation

## 2021-01-24 DIAGNOSIS — Z8489 Family history of other specified conditions: Secondary | ICD-10-CM | POA: Insufficient documentation

## 2021-01-24 DIAGNOSIS — I700 Atherosclerosis of aorta: Secondary | ICD-10-CM | POA: Diagnosis not present

## 2021-01-24 DIAGNOSIS — K219 Gastro-esophageal reflux disease without esophagitis: Secondary | ICD-10-CM | POA: Insufficient documentation

## 2021-01-24 DIAGNOSIS — R918 Other nonspecific abnormal finding of lung field: Secondary | ICD-10-CM | POA: Diagnosis not present

## 2021-01-24 DIAGNOSIS — Z9981 Dependence on supplemental oxygen: Secondary | ICD-10-CM | POA: Insufficient documentation

## 2021-01-24 DIAGNOSIS — Z809 Family history of malignant neoplasm, unspecified: Secondary | ICD-10-CM | POA: Insufficient documentation

## 2021-01-24 DIAGNOSIS — C3490 Malignant neoplasm of unspecified part of unspecified bronchus or lung: Secondary | ICD-10-CM | POA: Diagnosis not present

## 2021-01-24 DIAGNOSIS — E785 Hyperlipidemia, unspecified: Secondary | ICD-10-CM | POA: Diagnosis not present

## 2021-01-24 DIAGNOSIS — J9611 Chronic respiratory failure with hypoxia: Secondary | ICD-10-CM | POA: Diagnosis not present

## 2021-01-24 DIAGNOSIS — R911 Solitary pulmonary nodule: Secondary | ICD-10-CM | POA: Diagnosis not present

## 2021-01-24 DIAGNOSIS — R942 Abnormal results of pulmonary function studies: Secondary | ICD-10-CM | POA: Diagnosis not present

## 2021-01-24 DIAGNOSIS — Z8249 Family history of ischemic heart disease and other diseases of the circulatory system: Secondary | ICD-10-CM | POA: Insufficient documentation

## 2021-01-24 DIAGNOSIS — Z419 Encounter for procedure for purposes other than remedying health state, unspecified: Secondary | ICD-10-CM

## 2021-01-24 DIAGNOSIS — C3412 Malignant neoplasm of upper lobe, left bronchus or lung: Secondary | ICD-10-CM | POA: Diagnosis not present

## 2021-01-24 DIAGNOSIS — J849 Interstitial pulmonary disease, unspecified: Secondary | ICD-10-CM | POA: Diagnosis not present

## 2021-01-24 DIAGNOSIS — I10 Essential (primary) hypertension: Secondary | ICD-10-CM | POA: Insufficient documentation

## 2021-01-24 DIAGNOSIS — Z85850 Personal history of malignant neoplasm of thyroid: Secondary | ICD-10-CM | POA: Diagnosis not present

## 2021-01-24 DIAGNOSIS — F17210 Nicotine dependence, cigarettes, uncomplicated: Secondary | ICD-10-CM | POA: Insufficient documentation

## 2021-01-24 DIAGNOSIS — J449 Chronic obstructive pulmonary disease, unspecified: Secondary | ICD-10-CM | POA: Insufficient documentation

## 2021-01-24 DIAGNOSIS — R599 Enlarged lymph nodes, unspecified: Secondary | ICD-10-CM

## 2021-01-24 DIAGNOSIS — M353 Polymyalgia rheumatica: Secondary | ICD-10-CM | POA: Insufficient documentation

## 2021-01-24 DIAGNOSIS — Z9889 Other specified postprocedural states: Secondary | ICD-10-CM

## 2021-01-24 DIAGNOSIS — Z825 Family history of asthma and other chronic lower respiratory diseases: Secondary | ICD-10-CM | POA: Diagnosis not present

## 2021-01-24 HISTORY — PX: VIDEO BRONCHOSCOPY WITH ENDOBRONCHIAL ULTRASOUND: SHX6177

## 2021-01-24 HISTORY — PX: BRONCHIAL NEEDLE ASPIRATION BIOPSY: SHX5106

## 2021-01-24 HISTORY — DX: Dyspnea, unspecified: R06.00

## 2021-01-24 HISTORY — DX: Prediabetes: R73.03

## 2021-01-24 HISTORY — PX: BRONCHIAL BIOPSY: SHX5109

## 2021-01-24 HISTORY — PX: VIDEO BRONCHOSCOPY WITH ENDOBRONCHIAL NAVIGATION: SHX6175

## 2021-01-24 HISTORY — PX: BRONCHIAL BRUSHINGS: SHX5108

## 2021-01-24 LAB — BASIC METABOLIC PANEL
Anion gap: 7 (ref 5–15)
BUN: 14 mg/dL (ref 8–23)
CO2: 30 mmol/L (ref 22–32)
Calcium: 8.7 mg/dL — ABNORMAL LOW (ref 8.9–10.3)
Chloride: 102 mmol/L (ref 98–111)
Creatinine, Ser: 0.87 mg/dL (ref 0.44–1.00)
GFR, Estimated: >60 mL/min (ref >60)
Glucose, Bld: 117 mg/dL — ABNORMAL HIGH (ref 70–99)
Potassium: 4.5 mmol/L (ref 3.5–5.1)
Sodium: 139 mmol/L (ref 135–145)

## 2021-01-24 LAB — GLUCOSE, CAPILLARY: Glucose-Capillary: 138 mg/dL — ABNORMAL HIGH (ref 70–99)

## 2021-01-24 LAB — SARS CORONAVIRUS 2 (TAT 6-24 HRS): SARS Coronavirus 2: NEGATIVE

## 2021-01-24 SURGERY — BRONCHOSCOPY, WITH EBUS
Anesthesia: General

## 2021-01-24 MED ORDER — OXYCODONE HCL 5 MG PO TABS: 5.0000 mg | ORAL_TABLET | Freq: Once | ORAL | Status: DC | PRN

## 2021-01-24 MED ORDER — FENTANYL CITRATE (PF) 100 MCG/2ML IJ SOLN: 25.0000 ug | INTRAMUSCULAR | Status: DC | PRN

## 2021-01-24 MED ORDER — PHENYLEPHRINE 40 MCG/ML (10ML) SYRINGE FOR IV PUSH (FOR BLOOD PRESSURE SUPPORT)
PREFILLED_SYRINGE | INTRAVENOUS | Status: DC | PRN
  Administered 2021-01-24 (×2): 120 ug via INTRAVENOUS

## 2021-01-24 MED ORDER — LIDOCAINE 2% (20 MG/ML) 5 ML SYRINGE
INTRAMUSCULAR | Status: DC | PRN
  Administered 2021-01-24: 100 mg via INTRAVENOUS

## 2021-01-24 MED ORDER — CHLORHEXIDINE GLUCONATE 0.12 % MT SOLN
OROMUCOSAL | Status: AC
  Administered 2021-01-24: 15 mL via OROMUCOSAL
  Filled 2021-01-24: qty 15

## 2021-01-24 MED ORDER — CHLORHEXIDINE GLUCONATE 0.12 % MT SOLN: 15.0000 mL | Freq: Once | OROMUCOSAL | Status: AC

## 2021-01-24 MED ORDER — ONDANSETRON HCL 4 MG/2ML IJ SOLN
INTRAMUSCULAR | Status: DC | PRN
  Administered 2021-01-24: 4 mg via INTRAVENOUS

## 2021-01-24 MED ORDER — ROCURONIUM BROMIDE 10 MG/ML (PF) SYRINGE
PREFILLED_SYRINGE | INTRAVENOUS | Status: DC | PRN
  Administered 2021-01-24: 60 mg via INTRAVENOUS
  Administered 2021-01-24: 5 mg via INTRAVENOUS

## 2021-01-24 MED ORDER — PROPOFOL 10 MG/ML IV BOLUS
INTRAVENOUS | Status: DC | PRN
  Administered 2021-01-24: 160 mg via INTRAVENOUS

## 2021-01-24 MED ORDER — DEXAMETHASONE SODIUM PHOSPHATE 10 MG/ML IJ SOLN
INTRAMUSCULAR | Status: DC | PRN
  Administered 2021-01-24: 10 mg via INTRAVENOUS

## 2021-01-24 MED ORDER — ONDANSETRON HCL 4 MG/2ML IJ SOLN: 4.0000 mg | Freq: Once | INTRAMUSCULAR | Status: DC | PRN

## 2021-01-24 MED ORDER — LACTATED RINGERS IV SOLN: INTRAVENOUS | Status: DC

## 2021-01-24 MED ORDER — AMISULPRIDE (ANTIEMETIC) 5 MG/2ML IV SOLN: 10.0000 mg | Freq: Once | INTRAVENOUS | Status: DC | PRN

## 2021-01-24 MED ORDER — OXYCODONE HCL 5 MG/5ML PO SOLN: 5.0000 mg | Freq: Once | ORAL | Status: DC | PRN

## 2021-01-24 MED ORDER — FENTANYL CITRATE (PF) 100 MCG/2ML IJ SOLN
INTRAMUSCULAR | Status: DC | PRN
  Administered 2021-01-24: 100 ug via INTRAVENOUS

## 2021-01-24 MED ORDER — SUGAMMADEX SODIUM 200 MG/2ML IV SOLN
INTRAVENOUS | Status: DC | PRN
  Administered 2021-01-24: 200 mg via INTRAVENOUS

## 2021-01-24 MED ORDER — PHENYLEPHRINE HCL-NACL 10-0.9 MG/250ML-% IV SOLN
INTRAVENOUS | Status: DC | PRN
  Administered 2021-01-24: 50 ug/min via INTRAVENOUS

## 2021-01-24 SURGICAL SUPPLY — 46 items

## 2021-01-24 NOTE — Op Note (Signed)
**Note De-Identified Infantino Obfuscation** Video Bronchoscopy with Electromagnetic Navigation Procedure Note Video Bronchoscopy with Endobronchial Ultrasound Procedure Note  Date of Operation: 01/24/2021  Pre-op Diagnosis: Left upper lobe lung nodule  Post-op Diagnosis: Left upper lobe lung nodule  Surgeon: Garner Nash, DO  Assistants: Lenice Llamas, MD   Anesthesia: General endotracheal anesthesia  Operation: Flexible video fiberoptic bronchoscopy with electromagnetic navigation and biopsies.  Estimated Blood Loss: Minimal  Complications: None   Indications and History: Hailey Hernandez is a 65 y.o. female with left upper lobe lung nodule, cavitary nature with a PET avid associated hilar node.  The risks, benefits, complications, treatment options and expected outcomes were discussed with the patient.  The possibilities of pneumothorax, pneumonia, reaction to medication, pulmonary aspiration, perforation of a viscus, bleeding, failure to diagnose a condition and creating a complication requiring transfusion or operation were discussed with the patient who freely signed the consent.    Description of Procedure: The patient was seen in the Preoperative Area, was examined and was deemed appropriate to proceed.  The patient was taken to [facility] endoscopy room 2, identified as Hailey Hernandez and the procedure verified as Flexible Video Fiberoptic Bronchoscopy.  A Time Out was held and the above information confirmed.   Prior to the date of the procedure a high-resolution CT scan of the chest was performed. Utilizing [place] a virtual tracheobronchial tree was generated to allow the creation of distinct navigation pathways to the patient's parenchymal abnormalities. After being taken to the operating room general anesthesia was initiated and the patient  was orally intubated. The video fiberoptic bronchoscope was introduced Hanning the endotracheal tube and a general inspection was performed which showed normal right and left  lung anatomy, no evidence of endobronchial lesion, scattered airway pitting and striations.  Thick tenacious secretions.   Target #1 left upper lobe nodule: The extendable working channel and locator guide were introduced into the bronchoscope. The distinct navigation pathways prepared prior to this procedure were then utilized to navigate to within 0.8 cm of patient's lesion(s) identified on CT scan.  Full fluoroscopic sweep was obtained from RAO 25 degrees to LAO 25 degrees with inspiratory breath-hold at APL of 20 cm of water completed for local registration. The extendable working channel was secured into place and the locator guide was withdrawn. Under fluoroscopic guidance transbronchial needle brushings, transbronchial Wang needle biopsies, and transbronchial forceps biopsies were performed to be sent for cytology and pathology. A bronchioalveolar lavage was performed in the left upper lobe and sent for cytology.   Target #2 Station 7 endobronchial ultrasound: The standard scope was then withdrawn and the endobronchial ultrasound was used to identify and characterize the peritracheal, hilar and bronchial lymph nodes. Inspection showed mildly enlarged 1.2 cm cross-section subcarinal station 7 node.  We spent a significant amount of time in an attempt to get visualization of the PET avid node seen on CT within the left upper lobe anterior takeoff.  This node is seen on CT and pet imaging appears to be in the suprahilar space above the left upper lobe apical bronchus.  With various techniques including trying to insert the scope in an upside down fashion and then rotating within the apical segment to obtain a clear view for needle passing we were unsuccessful at obtaining a adequate image of the node for sampling.  We therefore turned our attention to nodes that were were visible under ultrasound and easily sampled. Using real-time ultrasound guidance Wang needle biopsies were take from Station 7 nodes and **Note De-Identified Ransom Obfuscation** were sent for cytology.  At the end of the procedure a general airway inspection was performed and there was no evidence of active bleeding. The bronchoscope was removed.  The patient tolerated the procedure well. There was no significant blood loss and there were no obvious complications. A post-procedural chest x-ray is pending.  Samples Target #1 left upper lobe nodule: 1. Transbronchial needle brushings from left upper lobe 2. Transbronchial Wang needle biopsies from left upper lobe 3. Transbronchial forceps biopsies from left upper lobe 4. Bronchoalveolar lavage from left upper lobe   Samples Target #2 Station 7: 1. Wang needle biopsies from station 7 node  Plans:  The patient will be discharged from the PACU to home when recovered from anesthesia and after chest x-ray is reviewed. We will review the cytology, pathology and microbiology results with the patient when they become available. Outpatient followup will be with Garner Nash, DO.   Garner Nash, DO [facility] 01/24/2021 1:01 PM

## 2021-01-24 NOTE — Transfer of Care (Signed)
**Note De-Identified Majerus Obfuscation** Immediate Anesthesia Transfer of Care Note  Patient: Antha C Frommer  Procedure(s) Performed: VIDEO BRONCHOSCOPY WITH ENDOBRONCHIAL ULTRASOUND (N/A ) BRONCHIAL BIOPSIES BRONCHIAL BRUSHINGS BRONCHIAL NEEDLE ASPIRATION BIOPSIES VIDEO BRONCHOSCOPY WITH ENDOBRONCHIAL NAVIGATION (N/A )  Patient Location: PACU  Anesthesia Type:General  Level of Consciousness: drowsy and patient cooperative  Airway & Oxygen Therapy: Patient Spontanous Breathing and Patient connected to face mask oxygen  Post-op Assessment: Report given to RN and Post -op Vital signs reviewed and stable  Post vital signs: Reviewed  Last Vitals:  Vitals Value Taken Time  BP 161/68 01/24/21 1311  Temp 37.1 C 01/24/21 1311  Pulse 118 01/24/21 1320  Resp 26 01/24/21 1319  SpO2 94 % 01/24/21 1320  Vitals shown include unvalidated device data.  Last Pain:  Vitals:   01/24/21 1006  TempSrc:   PainSc: 5       Patients Stated Pain Goal: 4 (09/16/23 4695)  Complications: No complications documented.

## 2021-01-24 NOTE — Anesthesia Postprocedure Evaluation (Signed)
**Note De-Identified Alkins Obfuscation** Anesthesia Post Note  Patient: Hailey Hernandez  Procedure(s) Performed: VIDEO BRONCHOSCOPY WITH ENDOBRONCHIAL ULTRASOUND (N/A ) BRONCHIAL BIOPSIES BRONCHIAL BRUSHINGS BRONCHIAL NEEDLE ASPIRATION BIOPSIES VIDEO BRONCHOSCOPY WITH ENDOBRONCHIAL NAVIGATION (N/A )     Patient location during evaluation: PACU Anesthesia Type: General Level of consciousness: awake and alert Pain management: pain level controlled Vital Signs Assessment: post-procedure vital signs reviewed and stable Respiratory status: spontaneous breathing, nonlabored ventilation and respiratory function stable Cardiovascular status: blood pressure returned to baseline and stable Postop Assessment: no apparent nausea or vomiting Anesthetic complications: no   No complications documented.  Last Vitals:  Vitals:   01/24/21 1438 01/24/21 1442  BP:  101/65  Pulse:  (!) 112  Resp:  (!) 22  Temp:    SpO2: (!) 88% 90%    Last Pain:  Vitals:   01/24/21 1442  TempSrc:   PainSc: 0-No pain                 Lidia Collum

## 2021-01-24 NOTE — Consult Note (Signed)
**Note De-Identified Hubert Obfuscation** Synopsis: Referred in April 2022 for lung nodule, adenopathy abnormal PET scan by No ref. provider found  Subjective:   PATIENT ID: Hailey Hernandez GENDER: female DOB: Feb 15, 1956, MRN: [id number]  Chief complaint: Abnormal CT, abnormal PET scan  This is a 65 year old female, past medical history of hypertension, GERD, tobacco abuse, chronic bronchitis, asthma.  Patient seen by Dr. Annamaria Boots at the pulmonary office.  Followed for her COPD and chronic hypoxemic respiratory failure.  Patient had high-resolution CT scan imaging of the chest on 12/30/2020 which revealed a 2.7 x 2 cm left upper lobe cavitary nodule with associated adenopathy within the left hilum.  Patient underwent nuclear medicine pet imaging on 01/09/2021 this revealed hypermetabolic lesion within the left upper lobe and a hypermetabolic left hilar lymph node consistent with a stage IIb (T1 cN1 M0) malignancy.  Patient was referred for navigational bronchoscopy and endobronchial ultrasound with tissue sampling.     Past Medical History:  Diagnosis Date  . Allergy   . Anxiety disorder   . Arthritis    bil legs  . Asthma   . Cancer ([place])   . Chronic bronchitis   . Complication of anesthesia    slow to wake up, one time she turned blue in recovery  . COPD (chronic obstructive pulmonary disease) ([place])   . Depression   . Diverticulitis   . DJD (degenerative joint disease)    spine  . Dyspnea    O2 at 3 L 24/7  . Fibromyalgia   . GERD (gastroesophageal reflux disease)   . Glaucoma   . History of thyroid cancer   . HTN (hypertension)   . Hyperlipidemia   . Impaired glucose tolerance 05/08/2013  . OSA (obstructive sleep apnea)    CPAP  last sleep study 8-10 yr.ag0  . Polymyalgia ([place])   . Pre-diabetes   . Psoriasis   . Rhinitis   . Tobacco abuse      Family History  Problem Relation Age of Onset  . Emphysema Sister   . Hypertension Sister   . Heart attack Sister   . Emphysema Sister   . Alpha-1 antitrypsin  deficiency Sister   . Alpha-1 antitrypsin deficiency Sister   . Allergies Sister   . Asthma Sister   . Heart disease Mother   . Cancer Mother   . Hyperlipidemia Mother   . Hypertension Mother   . Heart attack Mother   . Heart disease Father   . Heart attack Father   . Cancer Sister   . Cancer Brother   . Hyperlipidemia Brother   . Hypertension Brother   . Tuberculosis Other        grandmother  . Hyperlipidemia Daughter   . Hypertension Daughter   . Hypertension Son      Past Surgical History:  Procedure Laterality Date  . APPENDECTOMY    . CARPAL TUNNEL RELEASE  10/30/2011   Procedure: CARPAL TUNNEL RELEASE;  Surgeon: Wynonia Sours, MD;  Location: [facility];  Service: Orthopedics;  Laterality: Right;  and mass excision  . CHOLECYSTECTOMY    . CHOLESTEATOMA EXCISION     right ear  . left shoulder spurs    . ORIF right lower leg    . partial throidectomy    . TOTAL ABDOMINAL HYSTERECTOMY      Social History   Socioeconomic History  . Marital status: Married    Spouse name: Not on file  . Number of children: 3  . Years of education: **Note De-Identified K[place] Obfuscation** 9  . Highest education level: Not on file  Occupational History  . Occupation: disabled    Employer: DISABLED  Tobacco Use  . Smoking status: Current Every Day Smoker    Packs/day: 1.00    Years: 45.00    Pack years: 45.00    Types: Cigarettes  . Smokeless tobacco: Never Used  Vaping Use  . Vaping Use: Never used  Substance and Sexual Activity  . Alcohol use: No  . Drug use: No  . Sexual activity: Not on file  Other Topics Concern  . Not on file  Social History Narrative   Married w/ children   Daily caffeine use   Social Determinants of Health   Financial Resource Strain: Not on file  Food Insecurity: Not on file  Transportation Needs: Not on file  Physical Activity: Not on file  Stress: Not on file  Social Connections: Not on file  Intimate Partner Violence: Not on file     Allergies  Allergen  Reactions  . Doxycycline Shortness Of Breath and Other (See Comments)    Drug interaction with Soriatane  . Tramadol Shortness Of Breath    Did not work  . Milnacipran     REACTION: dizzy  . Apremilast Rash    Rash, able to take this currently     @ENCMEDSTART @  Review of Systems  Constitutional: Negative for chills, fever, malaise/fatigue and weight loss.  HENT: Negative for hearing loss, sore throat and tinnitus.   Eyes: Negative for blurred vision and double vision.  Respiratory: Positive for cough and shortness of breath. Negative for hemoptysis, sputum production, wheezing and stridor.   Cardiovascular: Negative for chest pain, palpitations, orthopnea, leg swelling and PND.  Gastrointestinal: Negative for abdominal pain, constipation, diarrhea, heartburn, nausea and vomiting.  Genitourinary: Negative for dysuria, hematuria and urgency.  Musculoskeletal: Negative for joint pain and myalgias.  Skin: Negative for itching and rash.  Neurological: Negative for dizziness, tingling, weakness and headaches.  Endo/Heme/Allergies: Negative for environmental allergies. Does not bruise/bleed easily.  Psychiatric/Behavioral: Negative for depression. The patient is not nervous/anxious and does not have insomnia.   All other systems reviewed and are negative.    Objective:  Physical Exam Vitals reviewed.  Constitutional:      General: She is not in acute distress.    Appearance: She is well-developed.  HENT:     Head: Normocephalic and atraumatic.     Mouth/Throat:     Pharynx: No oropharyngeal exudate.  Eyes:     Conjunctiva/sclera: Conjunctivae normal.     Pupils: Pupils are equal, round, and reactive to light.  Neck:     Vascular: No JVD.     Trachea: No tracheal deviation.     Comments: Loss of supraclavicular fat Cardiovascular:     Rate and Rhythm: Normal rate and regular rhythm.     Heart sounds: S1 normal and S2 normal.     Comments: Distant heart tones Pulmonary:      Effort: No tachypnea or accessory muscle usage.     Breath sounds: No stridor. Decreased breath sounds (throughout all lung fields) present. No wheezing, rhonchi or rales.  Abdominal:     General: Bowel sounds are normal. There is no distension.     Palpations: Abdomen is soft.     Tenderness: There is no abdominal tenderness.  Musculoskeletal:        General: No deformity (muscle wasting ).  Skin:    General: Skin is warm and dry. **Note De-Identified Esquivel Obfuscation** Capillary Refill: Capillary refill takes less than 2 seconds.     Findings: No rash.  Neurological:     Mental Status: She is alert and oriented to person, place, and time.  Psychiatric:        Behavior: Behavior normal.      There were no vitals filed for this visit.   on 2lpm  BMI Readings from Last 3 Encounters:  12/16/20 35.11 kg/m  09/01/20 35.94 kg/m  08/18/20 36.28 kg/m   Wt Readings from Last 3 Encounters:  12/16/20 95.7 kg  09/01/20 98 kg  08/18/20 98.9 kg     CBC    Component Value Date/Time   WBC 6.6 08/05/2020 1502   WBC 9.1 06/07/2018 2305   RBC 3.83 08/05/2020 1502   RBC 4.11 06/07/2018 2305   HGB 13.1 08/05/2020 1502   HGB 16.6 (H) 08/26/2015 1027   HCT 38.3 08/05/2020 1502   HCT 50.5 (H) 08/26/2015 1027   PLT 136 (L) 08/05/2020 1502   MCV 100 (H) 08/05/2020 1502   MCV 101.9 (H) 08/26/2015 1027   MCH 34.2 (H) 08/05/2020 1502   MCH 32.8 06/07/2018 2305   MCHC 34.2 08/05/2020 1502   MCHC 32.0 06/07/2018 2305   RDW 13.2 08/05/2020 1502   RDW 17.0 (H) 08/26/2015 1027   LYMPHSABS 1.3 08/05/2020 1502   LYMPHSABS 1.6 08/26/2015 1027   MONOABS 0.7 04/07/2018 1205   MONOABS 0.6 08/26/2015 1027   EOSABS 0.2 08/05/2020 1502   BASOSABS 0.0 08/05/2020 1502   BASOSABS 0.1 08/26/2015 1027    Chest Imaging: 12/30/2020 CT scan of the chest to the left upper lobe 2 cm cavitary lung nodule with associated left hilar adenopathy concerning for bronchogenic carcinoma. The patient's images have been independently reviewed by  me.   01/09/2021 nuclear medicine pet imaging PET avid left upper lobe cavitary nodule with associated PET avid left hilar adenopathy concerning for stage IIb lung cancer. The patient's images have been independently reviewed by me.    Pulmonary Functions Testing Results: No flowsheet data found.  FeNO:   Pathology:   Echocardiogram:   Heart Catheterization:     Assessment & Plan:   2 cm left upper lobe pulmonary nodule, cavitary nature Associated with hypermetabolic left hilar adenopathy concerning for a stage IIb lung cancer. Chronic hypoxemic respiratory failure on home oxygen therapy COPD  Discussion: Today we discussed the risk benefits and alternatives of proceeding with navigational bronchoscopy and video bronchoscopy with endobronchial ultrasound for tissue sampling and staging. We discussed the risk of bleeding, pneumothorax. Patient is agreeable to proceed.  No barriers at this time. For her COPD management following the procedure she will be able to return to her home medication regimen. We will ensure appropriate medical oncology, radiation oncology evaluation following tissue sampling and final pathology reports.   Current Facility-Administered Medications:  .  chlorhexidine (PERIDEX) 0.12 % solution, , , ,  .  chlorhexidine (PERIDEX) 0.12 % solution 15 mL, 15 mL, Mouth/Throat, Once, Roberts Gaudy, MD .  lactated ringers infusion, , Intravenous, Continuous, Roberts Gaudy, MD   Garner Nash, DO [facility] 01/24/2021 9:27 AM

## 2021-01-24 NOTE — H&P (View-Only) (Signed)
**Note De-Identified Yadao Obfuscation** Synopsis: Referred in April 2022 for lung nodule, adenopathy abnormal PET scan by No ref. provider found  Subjective:   PATIENT ID: Hailey Hernandez GENDER: female DOB: Jan 22, 1956, MRN: [id number]  Chief complaint: Abnormal CT, abnormal PET scan  This is a 65 year old female, past medical history of hypertension, GERD, tobacco abuse, chronic bronchitis, asthma.  Patient seen by Dr. Annamaria Boots at the pulmonary office.  Followed for her COPD and chronic hypoxemic respiratory failure.  Patient had high-resolution CT scan imaging of the chest on 12/30/2020 which revealed a 2.7 x 2 cm left upper lobe cavitary nodule with associated adenopathy within the left hilum.  Patient underwent nuclear medicine pet imaging on 01/09/2021 this revealed hypermetabolic lesion within the left upper lobe and a hypermetabolic left hilar lymph node consistent with a stage IIb (T1 cN1 M0) malignancy.  Patient was referred for navigational bronchoscopy and endobronchial ultrasound with tissue sampling.     Past Medical History:  Diagnosis Date  . Allergy   . Anxiety disorder   . Arthritis    bil legs  . Asthma   . Cancer ([place])   . Chronic bronchitis   . Complication of anesthesia    slow to wake up, one time Hailey Hernandez turned blue in recovery  . COPD (chronic obstructive pulmonary disease) ([place])   . Depression   . Diverticulitis   . DJD (degenerative joint disease)    spine  . Dyspnea    O2 at 3 L 24/7  . Fibromyalgia   . GERD (gastroesophageal reflux disease)   . Glaucoma   . History of thyroid cancer   . HTN (hypertension)   . Hyperlipidemia   . Impaired glucose tolerance 05/08/2013  . OSA (obstructive sleep apnea)    CPAP  last sleep study 8-10 yr.ag0  . Polymyalgia ([place])   . Pre-diabetes   . Psoriasis   . Rhinitis   . Tobacco abuse      Family History  Problem Relation Age of Onset  . Emphysema Sister   . Hypertension Sister   . Heart attack Sister   . Emphysema Sister   . Alpha-1 antitrypsin  deficiency Sister   . Alpha-1 antitrypsin deficiency Sister   . Allergies Sister   . Asthma Sister   . Heart disease Mother   . Cancer Mother   . Hyperlipidemia Mother   . Hypertension Mother   . Heart attack Mother   . Heart disease Father   . Heart attack Father   . Cancer Sister   . Cancer Brother   . Hyperlipidemia Brother   . Hypertension Brother   . Tuberculosis Other        grandmother  . Hyperlipidemia Daughter   . Hypertension Daughter   . Hypertension Son      Past Surgical History:  Procedure Laterality Date  . APPENDECTOMY    . CARPAL TUNNEL RELEASE  10/30/2011   Procedure: CARPAL TUNNEL RELEASE;  Surgeon: Wynonia Sours, MD;  Location: [facility];  Service: Orthopedics;  Laterality: Right;  and mass excision  . CHOLECYSTECTOMY    . CHOLESTEATOMA EXCISION     right ear  . left shoulder spurs    . ORIF right lower leg    . partial throidectomy    . TOTAL ABDOMINAL HYSTERECTOMY      Social History   Socioeconomic History  . Marital status: Married    Spouse name: Not on file  . Number of children: 3  . Years of education: **Note De-Identified [place] Obfuscation** 9  . Highest education level: Not on file  Occupational History  . Occupation: disabled    Employer: DISABLED  Tobacco Use  . Smoking status: Current Every Day Smoker    Packs/day: 1.00    Years: 45.00    Pack years: 45.00    Types: Cigarettes  . Smokeless tobacco: Never Used  Vaping Use  . Vaping Use: Never used  Substance and Sexual Activity  . Alcohol use: No  . Drug use: No  . Sexual activity: Not on file  Other Topics Concern  . Not on file  Social History Narrative   Married w/ children   Daily caffeine use   Social Determinants of Health   Financial Resource Strain: Not on file  Food Insecurity: Not on file  Transportation Needs: Not on file  Physical Activity: Not on file  Stress: Not on file  Social Connections: Not on file  Intimate Partner Violence: Not on file     Allergies  Allergen  Reactions  . Doxycycline Shortness Of Breath and Other (See Comments)    Drug interaction with Soriatane  . Tramadol Shortness Of Breath    Did not work  . Milnacipran     REACTION: dizzy  . Apremilast Rash    Rash, able to take this currently     @ENCMEDSTART @  Review of Systems  Constitutional: Negative for chills, fever, malaise/fatigue and weight loss.  HENT: Negative for hearing loss, sore throat and tinnitus.   Eyes: Negative for blurred vision and double vision.  Respiratory: Positive for cough and shortness of breath. Negative for hemoptysis, sputum production, wheezing and stridor.   Cardiovascular: Negative for chest pain, palpitations, orthopnea, leg swelling and PND.  Gastrointestinal: Negative for abdominal pain, constipation, diarrhea, heartburn, nausea and vomiting.  Genitourinary: Negative for dysuria, hematuria and urgency.  Musculoskeletal: Negative for joint pain and myalgias.  Skin: Negative for itching and rash.  Neurological: Negative for dizziness, tingling, weakness and headaches.  Endo/Heme/Allergies: Negative for environmental allergies. Does not bruise/bleed easily.  Psychiatric/Behavioral: Negative for depression. The patient is not nervous/anxious and does not have insomnia.   All other systems reviewed and are negative.    Objective:  Physical Exam Vitals reviewed.  Constitutional:      General: Hailey Hernandez is not in acute distress.    Appearance: Hailey Hernandez is well-developed.  HENT:     Head: Normocephalic and atraumatic.     Mouth/Throat:     Pharynx: No oropharyngeal exudate.  Eyes:     Conjunctiva/sclera: Conjunctivae normal.     Pupils: Pupils are equal, round, and reactive to light.  Neck:     Vascular: No JVD.     Trachea: No tracheal deviation.     Comments: Loss of supraclavicular fat Cardiovascular:     Rate and Rhythm: Normal rate and regular rhythm.     Heart sounds: S1 normal and S2 normal.     Comments: Distant heart tones Pulmonary:      Effort: No tachypnea or accessory muscle usage.     Breath sounds: No stridor. Decreased breath sounds (throughout all lung fields) present. No wheezing, rhonchi or rales.  Abdominal:     General: Bowel sounds are normal. There is no distension.     Palpations: Abdomen is soft.     Tenderness: There is no abdominal tenderness.  Musculoskeletal:        General: No deformity (muscle wasting ).  Skin:    General: Skin is warm and dry. **Note De-Identified [place] Obfuscation** Capillary Refill: Capillary refill takes less than 2 seconds.     Findings: No rash.  Neurological:     Mental Status: Hailey Hernandez is alert and oriented to person, place, and time.  Psychiatric:        Behavior: Behavior normal.      There were no vitals filed for this visit.   on 2lpm  BMI Readings from Last 3 Encounters:  12/16/20 35.11 kg/m  09/01/20 35.94 kg/m  08/18/20 36.28 kg/m   Wt Readings from Last 3 Encounters:  12/16/20 95.7 kg  09/01/20 98 kg  08/18/20 98.9 kg     CBC    Component Value Date/Time   WBC 6.6 08/05/2020 1502   WBC 9.1 06/07/2018 2305   RBC 3.83 08/05/2020 1502   RBC 4.11 06/07/2018 2305   HGB 13.1 08/05/2020 1502   HGB 16.6 (H) 08/26/2015 1027   HCT 38.3 08/05/2020 1502   HCT 50.5 (H) 08/26/2015 1027   PLT 136 (L) 08/05/2020 1502   MCV 100 (H) 08/05/2020 1502   MCV 101.9 (H) 08/26/2015 1027   MCH 34.2 (H) 08/05/2020 1502   MCH 32.8 06/07/2018 2305   MCHC 34.2 08/05/2020 1502   MCHC 32.0 06/07/2018 2305   RDW 13.2 08/05/2020 1502   RDW 17.0 (H) 08/26/2015 1027   LYMPHSABS 1.3 08/05/2020 1502   LYMPHSABS 1.6 08/26/2015 1027   MONOABS 0.7 04/07/2018 1205   MONOABS 0.6 08/26/2015 1027   EOSABS 0.2 08/05/2020 1502   BASOSABS 0.0 08/05/2020 1502   BASOSABS 0.1 08/26/2015 1027    Chest Imaging: 12/30/2020 CT scan of the chest to the left upper lobe 2 cm cavitary lung nodule with associated left hilar adenopathy concerning for bronchogenic carcinoma. The patient's images have been independently reviewed by  me.   01/09/2021 nuclear medicine pet imaging PET avid left upper lobe cavitary nodule with associated PET avid left hilar adenopathy concerning for stage IIb lung cancer. The patient's images have been independently reviewed by me.    Pulmonary Functions Testing Results: No flowsheet data found.  FeNO:   Pathology:   Echocardiogram:   Heart Catheterization:     Assessment & Plan:   2 cm left upper lobe pulmonary nodule, cavitary nature Associated with hypermetabolic left hilar adenopathy concerning for a stage IIb lung cancer. Chronic hypoxemic respiratory failure on home oxygen therapy COPD  Discussion: Today we discussed the risk benefits and alternatives of proceeding with navigational bronchoscopy and video bronchoscopy with endobronchial ultrasound for tissue sampling and staging. We discussed the risk of bleeding, pneumothorax. Patient is agreeable to proceed.  No barriers at this time. For her COPD management following the procedure Hailey Hernandez will be able to return to her home medication regimen. We will ensure appropriate medical oncology, radiation oncology evaluation following tissue sampling and final pathology reports.   Current Facility-Administered Medications:  .  chlorhexidine (PERIDEX) 0.12 % solution, , , ,  .  chlorhexidine (PERIDEX) 0.12 % solution 15 mL, 15 mL, Mouth/Throat, Once, Roberts Gaudy, MD .  lactated ringers infusion, , Intravenous, Continuous, Roberts Gaudy, MD   Garner Nash, DO [facility] 01/24/2021 9:27 AM

## 2021-01-24 NOTE — Anesthesia Preprocedure Evaluation (Addendum)
**Note De-Identified Leason Obfuscation** Anesthesia Evaluation  Patient identified by MRN, date of birth, ID band Patient awake    Reviewed: Allergy & Precautions, NPO status , Patient's Chart, lab work & pertinent test results  History of Anesthesia Complications Negative for: history of anesthetic complications  Airway Mallampati: II  TM Distance: >3 FB Neck ROM: Full    Dental  (+) Edentulous Upper, Edentulous Lower, Dental Advisory Given   Pulmonary asthma , sleep apnea , COPD,  COPD inhaler and oxygen dependent, Current Smoker and Patient abstained from smoking.,  Lung cancer   Pulmonary exam normal        Cardiovascular hypertension, Pt. on medications Normal cardiovascular exam     Neuro/Psych Anxiety Depression negative neurological ROS     GI/Hepatic Neg liver ROS, GERD  ,  Endo/Other  negative endocrine ROS  Renal/GU negative Renal ROS  negative genitourinary   Musculoskeletal  (+) Arthritis , Fibromyalgia -  Abdominal   Peds  Hematology negative hematology ROS (+)   Anesthesia Other Findings  Echo 2018: EF 55-60%, inferobasal hypokinesis, g1dd, mild LAE, valves unremarkable  Reproductive/Obstetrics                          Anesthesia Physical Anesthesia Plan  ASA: III  Anesthesia Plan: General   Post-op Pain Management:    Induction: Intravenous  PONV Risk Score and Plan: 2 and Ondansetron, Dexamethasone, Treatment may vary due to age or medical condition and Midazolam  Airway Management Planned: Oral ETT  Additional Equipment: None  Intra-op Plan:   Post-operative Plan: Extubation in OR  Informed Consent: I have reviewed the patients History and Physical, chart, labs and discussed the procedure including the risks, benefits and alternatives for the proposed anesthesia with the patient or authorized representative who has indicated his/her understanding and acceptance.     Dental advisory given  Plan  Discussed with:   Anesthesia Plan Comments:         Anesthesia Quick Evaluation

## 2021-01-24 NOTE — Anesthesia Procedure Notes (Signed)
**Note De-Identified [place] Obfuscation** Procedure Name: Intubation Date/Time: 01/24/2021 11:43 AM Performed by: Jenne Campus, CRNA Pre-anesthesia Checklist: Patient identified, Emergency Drugs available, Suction available and Patient being monitored Patient Re-evaluated:Patient Re-evaluated prior to induction Oxygen Delivery Method: Circle System Utilized Preoxygenation: Pre-oxygenation with 100% oxygen Induction Type: IV induction Ventilation: Mask ventilation without difficulty Laryngoscope Size: Miller and 3 Grade View: Grade I Tube type: Oral Tube size: 8.5 mm Number of attempts: 1 Airway Equipment and Method: Stylet and Oral airway Placement Confirmation: ETT inserted through vocal cords under direct vision,  positive ETCO2 and breath sounds checked- equal and bilateral Secured at: 21 cm Tube secured with: Tape Dental Injury: Teeth and Oropharynx as per pre-operative assessment

## 2021-01-24 NOTE — Interval H&P Note (Signed)
**Note De-Identified Rise Obfuscation** History and Physical Interval Note:  01/24/2021 10:50 AM  [name]  has presented today for surgery, with the diagnosis of LUNG CANCER.  The various methods of treatment have been discussed with the patient and family. After consideration of risks, benefits and other options for treatment, the patient has consented to  Procedure(s): VIDEO BRONCHOSCOPY WITH ENDOBRONCHIAL ULTRASOUND (N/A) VIDEO BRONCHOSCOPY WITH ENDOBRONCHIAL NAVIGATION (N/A) as a surgical intervention.  The patient's history has been reviewed, patient examined, no change in status, stable for surgery.  I have reviewed the patient's chart and labs.  Questions were answered to the patient's satisfaction.     [place]

## 2021-01-24 NOTE — Discharge Instructions (Signed)
**Note De-Identified Stiger Obfuscation** Flexible Bronchoscopy, Care After This sheet gives you information about how to care for yourself after your test. Your doctor may also give you more specific instructions. If you have problems or questions, contact your doctor. Follow these instructions at home: Eating and drinking  Do not eat or drink anything (not even water) for 2 hours after your test, or until your numbing medicine (local anesthetic) wears off.  When your numbness is gone and your cough and gag reflexes have come back, you may: ? Eat only soft foods. ? Slowly drink liquids.  The day after the test, go back to your normal diet. Driving  Do not drive for 24 hours if you were given a medicine to help you relax (sedative).  Do not drive or use heavy machinery while taking prescription pain medicine. General instructions   Take over-the-counter and prescription medicines only as told by your doctor.  Return to your normal activities as told. Ask what activities are safe for you.  Do not use any products that have nicotine or tobacco in them. This includes cigarettes and e-cigarettes. If you need help quitting, ask your doctor.  Keep all follow-up visits as told by your doctor. This is important. It is very important if you had a tissue sample (biopsy) taken. Get help right away if:  You have shortness of breath that gets worse.  You get light-headed.  You feel like you are going to pass out (faint).  You have chest pain.  You cough up: ? More than a little blood. ? More blood than before. Summary  Do not eat or drink anything (not even water) for 2 hours after your test, or until your numbing medicine wears off.  Do not use cigarettes. Do not use e-cigarettes.  Get help right away if you have chest pain.   This information is not intended to replace advice given to you by your health care provider. Make sure you discuss any questions you have with your health care provider. Document Released:  07/15/2009 Document Revised: 08/30/2017 Document Reviewed: 10/05/2016 Elsevier Patient Education  2020 [employer].

## 2021-01-25 ENCOUNTER — Encounter (HOSPITAL_COMMUNITY): Payer: Self-pay | Admitting: Pulmonary Disease

## 2021-01-25 ENCOUNTER — Telehealth: Payer: Self-pay | Admitting: Internal Medicine

## 2021-01-25 ENCOUNTER — Telehealth: Payer: Self-pay | Admitting: *Deleted

## 2021-01-25 ENCOUNTER — Encounter: Payer: Self-pay | Admitting: *Deleted

## 2021-01-25 DIAGNOSIS — R911 Solitary pulmonary nodule: Secondary | ICD-10-CM

## 2021-01-25 NOTE — Progress Notes (Signed)
**Note De-Identified Blazier Obfuscation** I received referral on Ms. Hailey Hernandez yesterday. I called and left vm message. She called me back. I updated on her appt with Dr. Julien Nordmann next week. She verbalized understanding.

## 2021-01-25 NOTE — Telephone Encounter (Signed)
**Note De-Identified Samarin Obfuscation** Called and spoke to pt's husband, Fritz Pickerel. Informed him of the recs per Dr. Annamaria Boots. He states the pt is doing ok and is fine waiting until the path report comes back. Appt scheduled with Dr. Annamaria Boots on 02/01/2021. Fritz Pickerel verbalized understanding and denied any further questions or concerns at this time.

## 2021-01-25 NOTE — Telephone Encounter (Signed)
**Note De-Identified Baggerly Obfuscation** It will take about 3-4 days for pathology report to come back. I can see her before that, but if she is comfortable, we can wait that long, then ok to use a held spot.

## 2021-01-25 NOTE — Telephone Encounter (Signed)
**Note De-Identified Hertzberg Obfuscation** I received referral on Hailey Hernandez yesterday.  She was still in the hospital yesterday but discharged today. I called to schedule her to see Dr. Julien Nordmann per Dr. Valeta Harms but could not reach. I did leave vm message with my name and phone number to call.

## 2021-01-25 NOTE — Telephone Encounter (Signed)
**Note De-Identified Beckel Obfuscation** Called and spoke to pt's husband, Fritz Pickerel. He states he was told by short stay to call Dr. Annamaria Boots to see when pt can be seen after her bronch on 01/24/2021. Fritz Pickerel states the pt is doing well and is no acute distress but pt does not want to wait till end of May to be seen (first available).   Dr. Annamaria Boots, please advise when you would like to see pt. Thanks.

## 2021-01-26 ENCOUNTER — Encounter: Payer: Self-pay | Admitting: Family Medicine

## 2021-01-26 ENCOUNTER — Telehealth: Payer: Self-pay | Admitting: Pulmonary Disease

## 2021-01-26 ENCOUNTER — Ambulatory Visit (INDEPENDENT_AMBULATORY_CARE_PROVIDER_SITE_OTHER): Payer: Medicare Other | Admitting: Family Medicine

## 2021-01-26 ENCOUNTER — Other Ambulatory Visit: Payer: Self-pay

## 2021-01-26 VITALS — BP 130/62 | HR 93 | Temp 96.6°F | Ht 65.0 in | Wt 209.8 lb

## 2021-01-26 DIAGNOSIS — J301 Allergic rhinitis due to pollen: Secondary | ICD-10-CM | POA: Diagnosis not present

## 2021-01-26 DIAGNOSIS — R911 Solitary pulmonary nodule: Secondary | ICD-10-CM | POA: Diagnosis not present

## 2021-01-26 DIAGNOSIS — J9611 Chronic respiratory failure with hypoxia: Secondary | ICD-10-CM

## 2021-01-26 LAB — CYTOLOGY - NON PAP

## 2021-01-26 MED ORDER — FLUTICASONE PROPIONATE 50 MCG/ACT NA SUSP: 2.0000 | Freq: Every day | NASAL | 6 refills | Status: DC

## 2021-01-26 NOTE — Progress Notes (Signed)
**Note De-Identified Temkin Obfuscation** Subjective: CC: Chronic respiratory failure with lung nodule PCP: [name], DO RDE:[id number] Hailey Hernandez is a 65 y.o. female presenting to clinic today for:  1.  Lung nodule Patient had lung nodule biopsied recently and unfortunately did not show lung cancer, squamous cell to be specific.  She does admit to some anxiety about this recent diagnosis.  Her brother unfortunately was recently diagnosed with metastatic prostate cancer and her sister passed away from lung cancer.  She does have good support by her family and her daughter in fact accompanies her today.  She reports that she has a new granddaughter, Hailey Hernandez, whom she is really enjoying spending time with.  She continues to smoke unfortunately.  She continues to use her oxygen as prescribed but tries to "be careful" when she smokes.  She knows the risks of continued smoking.  She has appointment with both her lung doctor and her new oncologist, Dr. Earlie Server, on Wednesday.  2.  Allergic rhinitis Patient reports that she does suffer from allergic rhinitis and used one of her family members Flonase recently.  She really felt better on that and would like to start on her own supply.  She continues to have intermittent cough in the setting of above.  She has been using Delsym with some relief.  ROS: Per HPI  Allergies  Allergen Reactions  . Doxycycline Shortness Of Breath and Other (See Comments)    Drug interaction with Soriatane  . Tramadol Shortness Of Breath    Did not work  . Milnacipran     REACTION: dizzy  . Apremilast Rash    Rash, able to take this currently   Past Medical History:  Diagnosis Date  . Allergy   . Anxiety disorder   . Arthritis    bil legs  . Asthma   . Cancer ([place])   . Chronic bronchitis   . Complication of anesthesia    slow to wake up, one time she turned blue in recovery  . COPD (chronic obstructive pulmonary disease) ([place])   . Depression   . Diverticulitis   . DJD (degenerative joint  disease)    spine  . Dyspnea    O2 at 3 L 24/7  . Fibromyalgia   . GERD (gastroesophageal reflux disease)   . Glaucoma   . History of thyroid cancer   . HTN (hypertension)   . Hyperlipidemia   . Impaired glucose tolerance 05/08/2013  . OSA (obstructive sleep apnea)    CPAP  last sleep study 8-10 yr.ag0  . Polymyalgia ([place])   . Pre-diabetes   . Psoriasis   . Rhinitis   . Tobacco abuse     Current Outpatient Medications:  .  albuterol (PROVENTIL) (2.5 MG/3ML) 0.083% nebulizer solution, Take 3 mLs (2.5 mg total) by nebulization every 4 (four) hours as needed for wheezing or shortness of breath. DX: 496, Disp: 180 mL, Rfl: 12 .  albuterol (VENTOLIN HFA) 108 (90 Base) MCG/ACT inhaler, Inhale 2 puffs every 4-6 hors as needed- rescue (Patient taking differently: Inhale 2 puffs into the lungs every 4 (four) hours as needed for wheezing or shortness of breath. as needed- rescue), Disp: 18 g, Rfl: 12 .  alendronate (FOSAMAX) 70 MG tablet, Take 1 tablet (70 mg total) by mouth every 7 (seven) days. Take with a full glass of water on an empty stomach. (Patient taking differently: Take 70 mg by mouth every 7 (seven) days. Take with a full glass of water on an empty stomach. **Note De-Identified Vesely Obfuscation** Sunday), Disp: 4 tablet, Rfl: 11 .  aspirin EC 81 MG tablet, Take 81 mg by mouth daily., Disp: , Rfl:  .  atorvastatin (LIPITOR) 80 MG tablet, TAKE 1 TABLET ONCE DAILY (Patient taking differently: Take 80 mg by mouth at bedtime.), Disp: 90 tablet, Rfl: 0 .  budesonide-formoterol (SYMBICORT) 160-4.5 MCG/ACT inhaler, Inhale 2 puffs then rinse mouth, twice daily (Patient taking differently: Inhale 2 puffs into the lungs 2 (two) times daily as needed (Shortness of breath).), Disp: 1 Inhaler, Rfl: 12 .  calcium carbonate (CALTRATE 600) 1500 (600 Ca) MG TABS tablet, Take 1 tablet (1,500 mg total) by mouth 2 (two) times daily with a meal., Disp: 60 tablet, Rfl: 3 .  colchicine 0.6 MG tablet, TAKE 1 TABLET ONCE DAILY (Patient taking  differently: Take 0.6 mg by mouth at bedtime.), Disp: 30 tablet, Rfl: 0 .  FLUoxetine (PROZAC) 20 MG capsule, TAKE 1 CAPSULE ONCE DAILY (Patient taking differently: Take 20 mg by mouth at bedtime.), Disp: 90 capsule, Rfl: 0 .  folic acid (FOLVITE) 1 MG tablet, Take 1 mg by mouth at bedtime., Disp: , Rfl:  .  lisinopril (ZESTRIL) 20 MG tablet, TAKE 1 TABLET BY MOUTH DAILY. (Patient taking differently: Take 20 mg by mouth at bedtime.), Disp: 90 tablet, Rfl: 0 .  montelukast (SINGULAIR) 10 MG tablet, TAKE 1 TABLET EVERY DAY (Patient taking differently: Take 10 mg by mouth at bedtime.), Disp: 90 tablet, Rfl: 0 .  Nebulizers (COMPRESSOR/NEBULIZER) MISC, Use up to 4 times daily when needed, Disp: 1 each, Rfl: 0 .  Respiratory Therapy Supplies (FLUTTER) DEVI, Patient to use device for ten minutes, three times daily, Disp: 1 each, Rfl: 0 Social History   Socioeconomic History  . Marital status: Married    Spouse name: Not on file  . Number of children: 3  . Years of education: 9  . Highest education level: Not on file  Occupational History  . Occupation: disabled    Employer: DISABLED  Tobacco Use  . Smoking status: Current Every Day Smoker    Packs/day: 1.00    Years: 45.00    Pack years: 45.00    Types: Cigarettes  . Smokeless tobacco: Never Used  Vaping Use  . Vaping Use: Never used  Substance and Sexual Activity  . Alcohol use: No  . Drug use: No  . Sexual activity: Not on file  Other Topics Concern  . Not on file  Social History Narrative   Married w/ children   Daily caffeine use   Social Determinants of Health   Financial Resource Strain: Not on file  Food Insecurity: Not on file  Transportation Needs: Not on file  Physical Activity: Not on file  Stress: Not on file  Social Connections: Not on file  Intimate Partner Violence: Not on file   Family History  Problem Relation Age of Onset  . Emphysema Sister   . Hypertension Sister   . Heart attack Sister   . Emphysema  Sister   . Alpha-1 antitrypsin deficiency Sister   . Alpha-1 antitrypsin deficiency Sister   . Allergies Sister   . Asthma Sister   . Heart disease Mother   . Cancer Mother   . Hyperlipidemia Mother   . Hypertension Mother   . Heart attack Mother   . Heart disease Father   . Heart attack Father   . Cancer Sister   . Cancer Brother   . Hyperlipidemia Brother   . Hypertension Brother   . Tuberculosis Other **Note De-Identified Cerrito Obfuscation** grandmother  . Hyperlipidemia Daughter   . Hypertension Daughter   . Hypertension Son     Objective: Office vital signs reviewed. BP 130/62   Pulse 93   Temp (!) 96.6 F (35.9 Hailey)   Ht 5\' 5"  (1.651 m)   Wt 209 lb 12.8 oz (95.2 kg)   SpO2 93%   BMI 34.91 kg/m   Physical Examination:  General: Awake, alert, chronically ill-appearing female, No acute distress HEENT: Normal; sclera slightly injected today Cardio: regular rate and rhythm, S1S2 heard, no murmurs appreciated Pulm: Globally decreased breath sounds throughout.  She has normal work of breathing on supplemental oxygen [place] nasal cannula MSK: Requires some assistance for ambulation Skin: Psoriatic plaques noted throughout bilateral upper extremities  Assessment/ Plan: 65 y.o. female   Chronic respiratory failure with hypoxia (HCC)  Lung nodule  Seasonal allergic rhinitis due to pollen - Plan: fluticasone (FLONASE) 50 MCG/ACT nasal spray  New lung cancer diagnosis.  Continues to smoke despite known lung disease, dependence on oxygen.  She clearly knows that smoking is not good for her but unfortunately continues to do so.  Hopefully this new diagnosis will be what she needs to really stop smoking again.  I reiterated to please call me if she has any needs going forward with this cancer diagnosis.  Her lung specialist have graciously kept me in the loop and I look forward to seeing what her treatment plan will be with her oncologist next week.  Flonase sent to pharmacy.  Follow-up as needed on that  issue  No orders of the defined types were placed in this encounter.  No orders of the defined types were placed in this encounter.    [name], DO [place] [phone]

## 2021-01-26 NOTE — Telephone Encounter (Signed)
**Note De-Identified Lasater Obfuscation** PCCM:  I called and spoke with the patient and patient's husband regarding positive tissue diagnosis for squamous cell carcinoma of the left upper lobe.  Patient has an appointment already set up with oncology, primary care and Dr. Annamaria Boots her primary pulmonologist.  Garner Nash, DO [facility] 01/26/2021 1:43 PM

## 2021-01-26 NOTE — Telephone Encounter (Signed)
**Note De-Identified Thau Obfuscation** Pt had bronch on 01/24/2021. Will close encounter.

## 2021-01-26 NOTE — Telephone Encounter (Signed)
**Note De-Identified Baranek Obfuscation** Appreciate all of the communication and haste getting her into oncology!  I saw her today.  She's understandably worried about the diagnosis (her brother just dx with metastatic prostate cancer and she had a sister pass from lung cancer).  Please let me know if there is anything I can do to help on this side.

## 2021-01-27 ENCOUNTER — Telehealth: Payer: Self-pay | Admitting: *Deleted

## 2021-01-27 NOTE — Telephone Encounter (Signed)
**Note De-Identified Smithhart Obfuscation** I called Ms. Yepiz today X 3 but was unable to reach. I wanted to remind her of her appt this week with Dr. Julien Nordmann.

## 2021-01-30 ENCOUNTER — Other Ambulatory Visit: Payer: Self-pay | Admitting: Family Medicine

## 2021-01-30 ENCOUNTER — Encounter: Payer: Self-pay | Admitting: Internal Medicine

## 2021-01-31 NOTE — Progress Notes (Signed)
**Note De-Identified Baltzell Obfuscation** Patient ID: Hailey Hernandez, female    DOB: 06-21-1956, 66 y.o.   MRN: [id number]  HPI female smoker followed for chronic bronchitis/COPD, chronic hypoxic respiratory failure, tobacco use (1 PPD/45 pack years), OSA, obesity hypoventilation, complicated by psoriasis, Squamous Cell CA lung 2022, Cirrhosis, Multi[ple Lung Nodules, SqCell Lung CA,  O2 2-3 L sleep/exertion, CPAP 13/[facility] Office Spirometry-06/07/17-severe obstructive airways disease, severe restriction of exhaled volume. FVC 1.18/37%, FEV1 0.90/36%, ratio 0.76, FEF 25-75% 0.73/33% CT chest 12/30/20- Cavitary LUL nodule and multiple small nodules Navigation Bronchoscopy Dr Valeta Harms 01/24/21- for LUL nodule met to L hilum by PET -----------------------------------------------------------------------------------------    12/16/20- 65 year old female smoker  followed for chronic bronchitis/COPD, chronic hypoxic respiratory failure, tobacco use (1 PPD/45 pack years), OSA, obesity hypoventilation, complicated by psoriasis, DM2, Atherosclerosis, Cirrhosis,  -Flutter device, Symbicort 160, albuterol hfa, neb albuterol -O2 2-3 L sleep/exertion, CPAP 5-15/[facility] Download- compliance 100%, AHI 0.7/ hr                   Daughter here 2 sisters died with a1AT def,     Body weight today-211 lbs Covid vax-- J&J, 1 Moderna Flu vax-had Had angiolipoma resected from abdominal wall. -----Breathing not good, gets winded easily Arrival sat 93% on 3L Uses Symbicort but not rescue hfa or nebulizer.  More dyspnea and chest congestion x 3 weeks, some yellow sputum but no fever or blood. She wouldn't go to doctor, so daughter gave her left-over doxycycline and prednisone- seemed to help but not back to baseline.  Uses CPAP anytime she sleeps- no problems. Discussed CXR., smoking, nebulizer use.  No longer on MTX for psoriasis, being treated in [place]  CXR 06/03/20- IMPRESSION: Progressive diffuse prominence of the interstitial markings, likely progressive  chronic interstitial lung disease. Consider short-term radiographic follow-up and/or high-resolution chest CT for further Evaluation.  02/01/21-  65 year old female smoker  followed for chronic bronchitis/COPD, chronic hypoxic respiratory failure, Tobacco use (1 PPD/45 pack years), OSA, obesity hypoventilation, complicated by psoriasis, DM2, SCLung Ca, Cirrhosis, lung nodules,  -Flutter device, Symbicort 160, albuterol hfa, neb albuterol -O2 2-3 L sleep/exertion,  CPAP 5-15/[facility]                                                 Husband here Download compliance 100%, AHI 0.8/ hr Body weight today  Covid vax- 1J&J, 1 Moderna Navigation Bronchoscopy Dr Valeta Harms 01/24/21- for LUL nodule met to L hilum by PET -----CPAP going well. Sob-same, cough light yellow,occass. Wheezing CT did rule out ILD. I reassured her she will be in good hands for her lung cancer. She admits being scared.  Breathing is stable for her with chronic cough.  She preferred Breztri samples over Symbicort. She and husband are finishing a last pack of cigarettes, then plan to quit together. CT chest 4/122-  IMPRESSION: 1. No findings to suggest interstitial lung disease. 2. Numerous pulmonary nodules scattered throughout the lungs bilaterally concerning for potential metastatic disease, although atypical infection is difficult to exclude. The largest of these is a cavitary left upper lobe nodule measuring 2.7 x 2.0 x 2.2 cm. Further clinical evaluation is recommended. Follow-up PET-CT should be considered if clinically appropriate. 3. Dilatation of the pulmonic trunk (3.5 cm in diameter), concerning for pulmonary arterial hypertension. 4. Probable cirrhosis with hepatic steatosis and hepatosplenomegaly. 5. Cardiomegaly. 6. There are calcifications of the **Note De-Identified Vizzini Obfuscation** aortic valve and mitral annulus. Echocardiographic correlation for evaluation of potential valvular dysfunction may be warranted if clinically indicated. 7. Aortic  atherosclerosis, in addition to left main and 3 vessel coronary artery disease. Please note that although the presence of coronary artery calcium documents the presence of coronary artery disease, the severity of this disease and any potential stenosis cannot be assessed on this non-gated CT examination. Assessment for potential risk factor modification, dietary therapy or pharmacologic therapy may be warranted, if clinically indicated.  PET 01/11/21-  IMPRESSION: 1. The dominant cavitary lesion in the left upper lobe is significantly hypermetabolic, most consistent with primary bronchogenic carcinoma. There is a single hypermetabolic left hilar lymph node. By this examination, findings are consistent with IIB disease (T1c N1 M0). 2. The multiple other smaller, ill-defined nodules in both lungs are  grossly stable, without hypermetabolic activity. Although suboptimally evaluated based on size, the morphology of these nodules on prior CT favors an atypical inflammatory process such as atypical mycobacterial infection. Attention on follow-up recommended. 3. No evidence of metastatic disease in the abdomen or pelvis. 4. Morphologic changes of cirrhosis with mild splenomegaly. 5. Carotid, coronary artery and Aortic Atherosclerosis (ICD10-I70.0).  Review of Systems-see HPI   + = positive Constitutional:   No-   weight loss, night sweats, fevers, chills, fatigue, lassitude. HEENT:    headaches, difficulty swallowing, tooth/dental problems, sore throat,        sneezing, itching,  ear ache,  +nasal congestion, +post nasal drip,  CV:  No-   chest pain, orthopnea, PND, swelling in lower extremities, anasarca,   dizziness, palpitations Resp: +  shortness of breath with exertion or at rest.               +productive cough,  + non-productive cough,  No- coughing up of blood.              change in color of mucus.  + wheezing.   Skin: +psoriasis GI:  No-   heartburn, indigestion, abdominal pain,  nausea, vomiting,  GU: No-    MS:  + joint pain or swelling. . Neuro-     nothing unusual Psych:  No- change in mood or affect. No depression or anxiety.  No memory loss.   Objective:   Physical Exam General- Alert, Oriented, Affect-appropriate, Distress- none acute, +obese.  On 3L POC O2 sat 96% Skin- + psoriasis plaques- flared since last visit Lymphadenopathy- none Head- atraumatic            Eyes- Gross vision intact, PERRLA, conjunctivae clear secretions            Ears- Hearing ok            Nose-  no-Septal dev, mucus, polyps, erosion, perforation             Throat- Mallampati III , mucosa clear , drainage- none, tonsils- atrophic.                        +dentures Neck- flexible , trachea midline, no stridor , thyroid nl, carotid no bruit Chest - symmetrical excursion , unlabored           Heart/CV- RRR , no murmur , no gallop  , no rub, nl s1 s2                           - JVD- none , edema- none, stasis dermatitis changes+ bilateral, **Note De-Identified Kittle Obfuscation** varices- none           Lung- +Diminished,  Wheeze- none, dullness-none,                                                    rub- none, , cough + loose           Chest wall-  Abd-  Br/ Gen/ Rectal- Not done, not indicated Extrem- +apparent lipoma right medial knee with +stasis changes,  Neuro- grossly intact to observation

## 2021-02-01 ENCOUNTER — Inpatient Hospital Stay: Payer: Medicare Other | Attending: Internal Medicine | Admitting: Internal Medicine

## 2021-02-01 ENCOUNTER — Encounter: Payer: Self-pay | Admitting: Internal Medicine

## 2021-02-01 ENCOUNTER — Ambulatory Visit: Payer: Medicare Other | Admitting: Internal Medicine

## 2021-02-01 ENCOUNTER — Other Ambulatory Visit: Payer: Self-pay

## 2021-02-01 ENCOUNTER — Inpatient Hospital Stay: Payer: Medicare Other

## 2021-02-01 VITALS — BP 137/60 | HR 109 | Temp 98.6°F | Resp 20 | Ht 65.0 in | Wt 205.9 lb

## 2021-02-01 DIAGNOSIS — Z9981 Dependence on supplemental oxygen: Secondary | ICD-10-CM | POA: Diagnosis not present

## 2021-02-01 DIAGNOSIS — Z809 Family history of malignant neoplasm, unspecified: Secondary | ICD-10-CM | POA: Diagnosis not present

## 2021-02-01 DIAGNOSIS — J9611 Chronic respiratory failure with hypoxia: Secondary | ICD-10-CM | POA: Diagnosis not present

## 2021-02-01 DIAGNOSIS — C3490 Malignant neoplasm of unspecified part of unspecified bronchus or lung: Secondary | ICD-10-CM

## 2021-02-01 DIAGNOSIS — R059 Cough, unspecified: Secondary | ICD-10-CM | POA: Insufficient documentation

## 2021-02-01 DIAGNOSIS — R0789 Other chest pain: Secondary | ICD-10-CM | POA: Insufficient documentation

## 2021-02-01 DIAGNOSIS — Z79899 Other long term (current) drug therapy: Secondary | ICD-10-CM | POA: Insufficient documentation

## 2021-02-01 DIAGNOSIS — Z720 Tobacco use: Secondary | ICD-10-CM | POA: Diagnosis not present

## 2021-02-01 DIAGNOSIS — J449 Chronic obstructive pulmonary disease, unspecified: Secondary | ICD-10-CM | POA: Diagnosis not present

## 2021-02-01 DIAGNOSIS — Z836 Family history of other diseases of the respiratory system: Secondary | ICD-10-CM | POA: Diagnosis not present

## 2021-02-01 DIAGNOSIS — R0602 Shortness of breath: Secondary | ICD-10-CM | POA: Diagnosis not present

## 2021-02-01 DIAGNOSIS — R911 Solitary pulmonary nodule: Secondary | ICD-10-CM

## 2021-02-01 DIAGNOSIS — Z5111 Encounter for antineoplastic chemotherapy: Secondary | ICD-10-CM | POA: Diagnosis not present

## 2021-02-01 DIAGNOSIS — Z8349 Family history of other endocrine, nutritional and metabolic diseases: Secondary | ICD-10-CM | POA: Diagnosis not present

## 2021-02-01 DIAGNOSIS — Z8249 Family history of ischemic heart disease and other diseases of the circulatory system: Secondary | ICD-10-CM

## 2021-02-01 DIAGNOSIS — Z832 Family history of diseases of the blood and blood-forming organs and certain disorders involving the immune mechanism: Secondary | ICD-10-CM

## 2021-02-01 DIAGNOSIS — R0609 Other forms of dyspnea: Secondary | ICD-10-CM | POA: Insufficient documentation

## 2021-02-01 DIAGNOSIS — R5383 Other fatigue: Secondary | ICD-10-CM | POA: Insufficient documentation

## 2021-02-01 DIAGNOSIS — G4733 Obstructive sleep apnea (adult) (pediatric): Secondary | ICD-10-CM

## 2021-02-01 DIAGNOSIS — R0600 Dyspnea, unspecified: Secondary | ICD-10-CM | POA: Diagnosis not present

## 2021-02-01 DIAGNOSIS — F17210 Nicotine dependence, cigarettes, uncomplicated: Secondary | ICD-10-CM | POA: Diagnosis not present

## 2021-02-01 DIAGNOSIS — C3492 Malignant neoplasm of unspecified part of left bronchus or lung: Secondary | ICD-10-CM

## 2021-02-01 DIAGNOSIS — C3412 Malignant neoplasm of upper lobe, left bronchus or lung: Secondary | ICD-10-CM | POA: Insufficient documentation

## 2021-02-01 DIAGNOSIS — R519 Headache, unspecified: Secondary | ICD-10-CM | POA: Diagnosis not present

## 2021-02-01 DIAGNOSIS — R634 Abnormal weight loss: Secondary | ICD-10-CM

## 2021-02-01 DIAGNOSIS — I10 Essential (primary) hypertension: Secondary | ICD-10-CM | POA: Insufficient documentation

## 2021-02-01 LAB — CBC WITH DIFFERENTIAL (CANCER CENTER ONLY)
Abs Immature Granulocytes: 0.01 10*3/uL (ref 0.00–0.07)
Basophils Absolute: 0 10*3/uL (ref 0.0–0.1)
Basophils Relative: 1 %
Eosinophils Absolute: 0.2 10*3/uL (ref 0.0–0.5)
Eosinophils Relative: 3 %
HCT: 41.8 % (ref 36.0–46.0)
Hemoglobin: 12.4 g/dL (ref 12.0–15.0)
Immature Granulocytes: 0 %
Lymphocytes Relative: 20 %
Lymphs Abs: 1.2 10*3/uL (ref 0.7–4.0)
MCH: 30.1 pg (ref 26.0–34.0)
MCHC: 29.7 g/dL — ABNORMAL LOW (ref 30.0–36.0)
MCV: 101.5 fL — ABNORMAL HIGH (ref 80.0–100.0)
Monocytes Absolute: 0.4 10*3/uL (ref 0.1–1.0)
Monocytes Relative: 7 %
Neutro Abs: 4.1 10*3/uL (ref 1.7–7.7)
Neutrophils Relative %: 69 %
Platelet Count: 130 10*3/uL — ABNORMAL LOW (ref 150–400)
RBC: 4.12 MIL/uL (ref 3.87–5.11)
RDW: 15.3 % (ref 11.5–15.5)
WBC Count: 5.9 10*3/uL (ref 4.0–10.5)
nRBC: 0 % (ref 0.0–0.2)

## 2021-02-01 LAB — BASIC METABOLIC PANEL - CANCER CENTER ONLY
Anion gap: 10 (ref 5–15)
BUN: 17 mg/dL (ref 8–23)
CO2: 36 mmol/L — ABNORMAL HIGH (ref 22–32)
Calcium: 8.9 mg/dL (ref 8.9–10.3)
Chloride: 99 mmol/L (ref 98–111)
Creatinine: 1.32 mg/dL — ABNORMAL HIGH (ref 0.44–1.00)
GFR, Estimated: 45 mL/min — ABNORMAL LOW (ref >60)
Glucose, Bld: 211 mg/dL — ABNORMAL HIGH (ref 70–99)
Potassium: 4.5 mmol/L (ref 3.5–5.1)
Sodium: 145 mmol/L (ref 135–145)

## 2021-02-01 MED ORDER — BREZTRI AEROSPHERE 160-9-4.8 MCG/ACT IN AERO: 2.0000 | INHALATION_SPRAY | Freq: Two times a day (BID) | RESPIRATORY_TRACT | 0 refills | Status: DC

## 2021-02-01 MED ORDER — PROCHLORPERAZINE MALEATE 10 MG PO TABS: 10.0000 mg | ORAL_TABLET | Freq: Four times a day (QID) | ORAL | 0 refills | Status: DC | PRN

## 2021-02-01 NOTE — Assessment & Plan Note (Signed)
**Note De-Identified Bastien Obfuscation** Problem will be defined by Oncology. LUL Sq Cell met to L hilar node.  Also has multiple small nodules too small for PET, but currently favored to be benign inflammatory. Plan observation.

## 2021-02-01 NOTE — Assessment & Plan Note (Signed)
**Note De-Identified Halliday Obfuscation** Benefits from CPAP and remains compliant with good download.  Plan- continue CPAP auto 5-15

## 2021-02-01 NOTE — Assessment & Plan Note (Signed)
**Note De-Identified Wickwire Obfuscation** Remains dependent on O2 2-3L 24/ 7

## 2021-02-01 NOTE — Assessment & Plan Note (Signed)
**Note De-Identified Swanger Obfuscation** Severe persistent without exacerbation. Plan- Changing from Symbicort to [facility]

## 2021-02-01 NOTE — Patient Instructions (Signed)
**Note De-Identified Lowery Obfuscation** Order- sample x 2 Breztri inhaler   Inhale 2 puffs then rinse mouth, twice daily  Script sent for Breztri to replace your Symbicort inhaler  I hope your meeting today with Dr Julien Nordmann goes well.  Please call if we can help

## 2021-02-01 NOTE — Patient Instructions (Signed)
**Note De-identified [place] [employer]** Steps to Quit Smoking Smoking tobacco is the leading cause of preventable death. It can affect almost every organ in the body. Smoking puts you and people around you at risk for many serious, long-lasting (chronic) diseases. Quitting smoking can be hard, but it is one of the best things that you can do for your health. It is never too late to quit. How do I get ready to quit? When you decide to quit smoking, make a plan to help you succeed. Before you quit:  Pick a date to quit. Set a date within the next 2 weeks to give you time to prepare.  Write down the reasons why you are quitting. Keep this list in places where you will see it often.  Tell your family, friends, and co-workers that you are quitting. Their support is important.  Talk with your doctor about the choices that may help you quit.  Find out if your health insurance will pay for these treatments.  Know the people, places, things, and activities that make you want to smoke (triggers). Avoid them. What first steps can I take to quit smoking?  Throw away all cigarettes at home, at work, and in your car.  Throw away the things that you use when you smoke, such as ashtrays and lighters.  Clean your car. Make sure to empty the ashtray.  Clean your home, including curtains and carpets. What can I do to help me quit smoking? Talk with your doctor about taking medicines and seeing a counselor at the same time. You are more likely to succeed when you do both.  If you are pregnant or breastfeeding, talk with your doctor about counseling or other ways to quit smoking. Do not take medicine to help you quit smoking unless your doctor tells you to do so. To quit smoking: Quit right away  Quit smoking totally, instead of slowly cutting back on how much you smoke over a period of time.  Go to counseling. You are more likely to quit if you go to counseling sessions regularly. Take medicine You may take medicines to help you quit. Some  medicines need a prescription, and some you can buy over-the-counter. Some medicines may contain a drug called nicotine to replace the nicotine in cigarettes. Medicines may:  Help you to stop having the desire to smoke (cravings).  Help to stop the problems that come when you stop smoking (withdrawal symptoms). Your doctor may ask you to use:  Nicotine patches, gum, or lozenges.  Nicotine inhalers or sprays.  Non-nicotine medicine that is taken by mouth. Find resources Find resources and other ways to help you quit smoking and remain smoke-free after you quit. These resources are most helpful when you use them often. They include:  Online chats with a counselor.  Phone quitlines.  Printed self-help materials.  Support groups or group counseling.  Text messaging programs.  Mobile phone apps. Use apps on your mobile phone or tablet that can help you stick to your quit plan. There are many free apps for mobile phones and tablets as well as websites. Examples include Quit Guide from the CDC and smokefree.gov   What things can I do to make it easier to quit?  Talk to your family and friends. Ask them to support and encourage you.  Call a phone quitline (1-800-QUIT-NOW), reach out to support groups, or work with a counselor.  Ask people who smoke to not smoke around you.  Avoid places that make you want to smoke,  **Note De-identified [place] [employer]** such as: ? Bars. ? Parties. ? Smoke-break areas at work.  Spend time with people who do not smoke.  Lower the stress in your life. Stress can make you want to smoke. Try these things to help your stress: ? Getting regular exercise. ? Doing deep-breathing exercises. ? Doing yoga. ? Meditating. ? Doing a body scan. To do this, close your eyes, focus on one area of your body at a time from head to toe. Notice which parts of your body are tense. Try to relax the muscles in those areas.   How will I feel when I quit smoking? Day 1 to 3 weeks Within the first 24 hours,  you may start to have some problems that come from quitting tobacco. These problems are very bad 2-3 days after you quit, but they do not often last for more than 2-3 weeks. You may get these symptoms:  Mood swings.  Feeling restless, nervous, angry, or annoyed.  Trouble concentrating.  Dizziness.  Strong desire for high-sugar foods and nicotine.  Weight gain.  Trouble pooping (constipation).  Feeling like you may vomit (nausea).  Coughing or a sore throat.  Changes in how the medicines that you take for other issues work in your body.  Depression.  Trouble sleeping (insomnia). Week 3 and afterward After the first 2-3 weeks of quitting, you may start to notice more positive results, such as:  Better sense of smell and taste.  Less coughing and sore throat.  Slower heart rate.  Lower blood pressure.  Clearer skin.  Better breathing.  Fewer sick days. Quitting smoking can be hard. Do not give up if you fail the first time. Some people need to try a few times before they succeed. Do your best to stick to your quit plan, and talk with your doctor if you have any questions or concerns. Summary  Smoking tobacco is the leading cause of preventable death. Quitting smoking can be hard, but it is one of the best things that you can do for your health.  When you decide to quit smoking, make a plan to help you succeed.  Quit smoking right away, not slowly over a period of time.  When you start quitting, seek help from your doctor, family, or friends. This information is not intended to replace advice given to you by your health care provider. Make sure you discuss any questions you have with your health care provider. Document Revised: 06/12/2019 Document Reviewed: 12/06/2018 Elsevier Patient Education  2021 [employer]  

## 2021-02-01 NOTE — Assessment & Plan Note (Signed)
**Note De-Identified [place] [employer]ation** She and husband express intention to quit as they finish last pack of cigarettes. We discussed and support this.

## 2021-02-01 NOTE — Progress Notes (Signed)
**Note De-Identified [place] [employer]bfuscation** START ON PATHWAY REGIMEN - Non-Small Cell Lung     Administer weekly:     Paclitaxel      Carboplatin   **Always confirm dose/schedule in your pharmacy ordering system**  Patient Characteristics: Preoperative or Nonsurgical Candidate (Clinical Staging), Stage II, Nonsurgical Candidate Therapeutic Status: Preoperative or Nonsurgical Candidate (Clinical Staging) AJCC T Category: cT1c AJCC N Category: cN1 AJCC M Category: cM0 AJCC 8 Stage Grouping: IIB Intent of Therapy: Curative Intent, Discussed with Patient

## 2021-02-01 NOTE — Progress Notes (Signed)
**Note De-Identified [facility]inger Obfuscation** [place] [phone]   [phone]  CONSULT NOTE  REFERRING PHYSICIAN: Dr. Leory Plowman Icard  REASON FOR CONSULTATION:  65 years old white female recently diagnosed with lung cancer  HPI Hailey Hernandez is a 65 y.o. female with past medical history significant for multiple medical problems including COPD, hypertension, dyslipidemia, diverticulosis, depression, anxiety, seasonal allergy, polymyalgia, severe psoriasis, borderline diabetes as well as obstructive sleep apnea and long history of smoking.  The patient was seen by her pulmonologist Dr. Annamaria Boots for evaluation of interstitial lung disease and she was complaining of worsening dyspnea.  She had CT scan of the chest performed on 12/30/2020 and it showed numerous pulmonary nodules scattered throughout the lungs bilaterally the largest and most concerning is near the apex of the left upper lobe and measures 2.7 x 2.0 x 2.2 cm with microlobulated and spiculated margins.  There was also multiple prominent borderline enlarged mediastinal and bilateral hilar lymph node measuring up to 1.3 cm in short axis in the lower right paratracheal nodal station.  The patient had a PET scan performed on January 09, 2021 and it showed hypermetabolic left hilar lymph node with SUV max of 9.5.  No other hypermetabolic mediastinal, hilar or axillary lymph nodes identified.  The dominant cavitary left upper lobe lesion is hypermetabolic and measures 2.3 x 1.8 cm with maximum SUV of 10.9.  No hypermetabolic activity associated with the other smaller and ill-defined nodules in both lungs. The patient was seen by Dr. Valeta Harms and on January 24, 2021 she underwent video bronchoscopy with electromagnetic navigation procedure with endobronchial ultrasound and biopsies of the left upper lobe lung nodule and left hilar lymph node. The final pathology ([id number]) from the left upper lobe fine-needle aspiration as well as the left upper lobe  bronchial brushings showed malignant cells. Immunohistochemistry is positive for cytokeratin 5/6 and p40. TTF-1 and NapsinA are negative. The immunoprofile is consistent with a squamous cell carcinoma. Dr. Valeta Harms kindly referred the patient to me today for evaluation and recommendation regarding treatment of her condition. When seen today she is feeling fine except for the baseline shortness of breath and she is currently on home oxygen.  She also has cough productive of yellowish sputum but no significant chest pain or hemoptysis.  She has no nausea, vomiting, diarrhea or constipation but has intermittent headache.  The patient also mentioned that she lost around 80 pounds in the last year. Family history significant for mother with pancreatic cancer.  Sister had a small cell lung cancer.  Brother and sister had leukemia and another brother had prostate cancer.  Her father died from heart disease. The patient is married and has 3 children.  She used to work in [employer].  She was accompanied today by her husband Fritz Pickerel.  She has a history of smoking up to 1.5 pack/day for around 54 years and trying to quit.  She has no history of alcohol or drug abuse.  Past Medical History:  Diagnosis Date  . Allergy   . Anxiety disorder   . Arthritis    bil legs  . Asthma   . Cancer ([place])   . Chronic bronchitis   . Complication of anesthesia    slow to wake up, one time she turned blue in recovery  . COPD (chronic obstructive pulmonary disease) ([place])   . Depression   . Diverticulitis   . DJD (degenerative joint disease)    spine  . Dyspnea    O2 at **Note De-Identified [facility] Obfuscation** 3 L 24/7  . Fibromyalgia   . GERD (gastroesophageal reflux disease)   . Glaucoma   . History of thyroid cancer   . HTN (hypertension)   . Hyperlipidemia   . Impaired glucose tolerance 05/08/2013  . OSA (obstructive sleep apnea)    CPAP  last sleep study 8-10 yr.ag0  . Polymyalgia ([place])   . Pre-diabetes   . Psoriasis   . Rhinitis   . Tobacco abuse      Past Surgical History:  Procedure Laterality Date  . APPENDECTOMY    . BRONCHIAL BIOPSY  01/24/2021   Procedure: BRONCHIAL BIOPSIES;  Surgeon: Garner Nash, DO;  Location: [facility];  Service: Pulmonary;;  . BRONCHIAL BRUSHINGS  01/24/2021   Procedure: BRONCHIAL BRUSHINGS;  Surgeon: Garner Nash, DO;  Location: [facility];  Service: Pulmonary;;  . BRONCHIAL NEEDLE ASPIRATION BIOPSY  01/24/2021   Procedure: BRONCHIAL NEEDLE ASPIRATION BIOPSIES;  Surgeon: Garner Nash, DO;  Location: [facility];  Service: Pulmonary;;  . CARPAL TUNNEL RELEASE  10/30/2011   Procedure: CARPAL TUNNEL RELEASE;  Surgeon: Wynonia Sours, MD;  Location: [facility];  Service: Orthopedics;  Laterality: Right;  and mass excision  . CHOLECYSTECTOMY    . CHOLESTEATOMA EXCISION     right ear  . left shoulder spurs    . ORIF right lower leg    . partial throidectomy    . TOTAL ABDOMINAL HYSTERECTOMY    . VIDEO BRONCHOSCOPY WITH ENDOBRONCHIAL NAVIGATION N/A 01/24/2021   Procedure: VIDEO BRONCHOSCOPY WITH ENDOBRONCHIAL NAVIGATION;  Surgeon: Garner Nash, DO;  Location: [facility];  Service: Pulmonary;  Laterality: N/A;  . VIDEO BRONCHOSCOPY WITH ENDOBRONCHIAL ULTRASOUND N/A 01/24/2021   Procedure: VIDEO BRONCHOSCOPY WITH ENDOBRONCHIAL ULTRASOUND;  Surgeon: Garner Nash, DO;  Location: [facility];  Service: Pulmonary;  Laterality: N/A;    Family History  Problem Relation Age of Onset  . Emphysema Sister   . Hypertension Sister   . Heart attack Sister   . Emphysema Sister   . Alpha-1 antitrypsin deficiency Sister   . Alpha-1 antitrypsin deficiency Sister   . Allergies Sister   . Asthma Sister   . Heart disease Mother   . Cancer Mother   . Hyperlipidemia Mother   . Hypertension Mother   . Heart attack Mother   . Heart disease Father   . Heart attack Father   . Cancer Sister   . Cancer Brother   . Hyperlipidemia Brother   . Hypertension Brother   . Tuberculosis Other         grandmother  . Hyperlipidemia Daughter   . Hypertension Daughter   . Hypertension Son     Social History Social History   Tobacco Use  . Smoking status: Current Every Day Smoker    Packs/day: 1.00    Years: 45.00    Pack years: 45.00    Types: Cigarettes  . Smokeless tobacco: Never Used  Vaping Use  . Vaping Use: Never used  Substance Use Topics  . Alcohol use: No  . Drug use: No    Allergies  Allergen Reactions  . Doxycycline Shortness Of Breath and Other (See Comments)    Drug interaction with Soriatane  . Tramadol Shortness Of Breath    Did not work  . Milnacipran     REACTION: dizzy  . Apremilast Rash    Rash, able to take this currently    Current Outpatient Medications  Medication Sig Dispense Refill  . albuterol (PROVENTIL) (2.5 **Note De-Identified Kirk[place]oll Obfuscation** MG/3ML) 0.083% nebulizer solution Take 3 mLs (2.5 mg total) by nebulization every 4 (four) hours as needed for wheezing or shortness of breath. DX: 496 180 mL 12  . albuterol (VENTOLIN HFA) 108 (90 Base) MCG/ACT inhaler Inhale 2 puffs every 4-6 hors as needed- rescue 18 g 12  . alendronate (FOSAMAX) 70 MG tablet Take 1 tablet (70 mg total) by mouth every 7 (seven) days. Take with a full glass of water on an empty stomach. (Patient taking differently: Take 70 mg by mouth every 7 (seven) days. Take with a full glass of water on an empty stomach. Sunday) 4 tablet 11  . aspirin EC 81 MG tablet Take 81 mg by mouth daily.    Marland Kitchen atorvastatin (LIPITOR) 80 MG tablet TAKE 1 TABLET ONCE DAILY (Patient taking differently: Take 80 mg by mouth at bedtime.) 90 tablet 0  . Budeson-Glycopyrrol-Formoterol (BREZTRI AEROSPHERE) 160-9-4.8 MCG/ACT AERO Inhale 2 puffs into the lungs 2 (two) times daily. 5.9 g 0  . calcium carbonate (CALTRATE 600) 1500 (600 Ca) MG TABS tablet Take 1 tablet (1,500 mg total) by mouth 2 (two) times daily with a meal. 60 tablet 3  . colchicine 0.6 MG tablet TAKE 1 TABLET ONCE DAILY 30 tablet 0  . FLUoxetine (PROZAC) 20 MG  capsule TAKE 1 CAPSULE ONCE DAILY (Patient taking differently: Take 20 mg by mouth at bedtime.) 90 capsule 0  . fluticasone (FLONASE) 50 MCG/ACT nasal spray Place 2 sprays into both nostrils daily. 16 g 6  . folic acid (FOLVITE) 1 MG tablet Take 1 mg by mouth at bedtime.    Marland Kitchen lisinopril (ZESTRIL) 20 MG tablet TAKE 1 TABLET BY MOUTH DAILY. (Patient taking differently: Take 20 mg by mouth at bedtime.) 90 tablet 0  . montelukast (SINGULAIR) 10 MG tablet TAKE 1 TABLET EVERY DAY (Patient taking differently: Take 10 mg by mouth at bedtime.) 90 tablet 0  . Nebulizers (COMPRESSOR/NEBULIZER) MISC Use up to 4 times daily when needed 1 each 0  . Respiratory Therapy Supplies (FLUTTER) DEVI Patient to use device for ten minutes, three times daily 1 each 0   No current facility-administered medications for this visit.    Review of Systems  Constitutional: positive for fatigue and weight loss Eyes: negative Ears, nose, mouth, throat, and face: negative Respiratory: positive for cough, dyspnea on exertion and sputum Cardiovascular: negative Gastrointestinal: negative Genitourinary:negative Integument/breast: negative Hematologic/lymphatic: negative Musculoskeletal:negative Neurological: positive for headaches Behavioral/Psych: negative Endocrine: negative Allergic/Immunologic: negative  Physical Exam  WJX:BJYNW, healthy, no distress, well nourished, well developed and anxious SKIN: skin color, texture, turgor are normal, no rashes or significant lesions HEAD: Normocephalic, No masses, lesions, tenderness or abnormalities EYES: normal, PERRLA, Conjunctiva are pink and non-injected EARS: External ears normal, Canals clear OROPHARYNX:no exudate, no erythema and lips, buccal mucosa, and tongue normal  NECK: supple, no adenopathy, no JVD LYMPH:  no palpable lymphadenopathy, no hepatosplenomegaly BREAST:not examined LUNGS: coarse sounds heard, decreased breath sounds HEART: regular rate & rhythm,  no murmurs and no gallops ABDOMEN:abdomen soft, non-tender, obese, normal bowel sounds and no masses or organomegaly BACK: No CVA tenderness, Range of motion is normal EXTREMITIES:no joint deformities, effusion, or inflammation, no edema  NEURO: alert & oriented x 3 with fluent speech, no focal motor/sensory deficits  PERFORMANCE STATUS: ECOG 1  LABORATORY DATA: Lab Results  Component Value Date   WBC 5.9 02/01/2021   HGB 12.4 02/01/2021   HCT 41.8 02/01/2021   MCV 101.5 (H) 02/01/2021   PLT 130 (L) 02/01/2021 **Note De-Identified Borowski Obfuscation** Chemistry      Component Value Date/Time   NA 139 01/24/2021 0922   NA 142 08/05/2020 1502   NA 142 08/26/2015 1027   K 4.5 01/24/2021 0922   K 4.3 08/26/2015 1027   CL 102 01/24/2021 0922   CO2 30 01/24/2021 0922   CO2 34 (H) 08/26/2015 1027   BUN 14 01/24/2021 0922   BUN 14 08/05/2020 1502   BUN 9.6 08/26/2015 1027   CREATININE 0.87 01/24/2021 0922   CREATININE 0.7 08/26/2015 1027      Component Value Date/Time   CALCIUM 8.7 (L) 01/24/2021 0922   CALCIUM 9.5 08/26/2015 1027   ALKPHOS 177 (H) 08/05/2020 1502   ALKPHOS 131 08/26/2015 1027   AST 28 08/05/2020 1502   AST 15 08/26/2015 1027   ALT 18 08/05/2020 1502   ALT 15 08/26/2015 1027   BILITOT 0.5 08/05/2020 1502   BILITOT 0.66 08/26/2015 1027       RADIOGRAPHIC STUDIES: NM PET Image Initial (PI) Skull Base To Thigh  Result Date: 01/11/2021 CLINICAL DATA:  Initial treatment strategy for lung nodules. Remote history of thyroid cancer. EXAM: NUCLEAR MEDICINE PET SKULL BASE TO THIGH TECHNIQUE: 12.03 mCi F-18 FDG was injected intravenously. Full-ring PET imaging was performed from the skull base to thigh after the radiotracer. CT data was obtained and used for attenuation correction and anatomic localization. Fasting blood glucose: 83 mg/dl COMPARISON:  Chest CT 12/30/2020 and 06/08/2018 FINDINGS: Mediastinal blood pool activity: SUV max NECK: No hypermetabolic cervical lymph nodes are identified.There  are no lesions of the pharyngeal mucosal space. Incidental CT findings: Severe carotid atherosclerosis bilaterally. Apparent previous left thyroidectomy. The right thyroid lobe appears unremarkable. CHEST: There is a hypermetabolic left hilar lymph node (SUV max 9.5). No other hypermetabolic mediastinal, hilar or axillary lymph nodes are identified. There are small probable reactive nodes in the right axilla. The dominant cavitary left upper lobe lesion is hypermetabolic. This measures 2.3 x 1.8 cm on image 69/3 and has an SUV max of 10.9. There is no hypermetabolic activity associated with the other smaller and ill-defined nodules in both lungs. Incidental CT findings: Diffuse atherosclerosis of the aorta, great vessels and coronary arteries. Aortic valvular calcifications. As above, the ill-defined pulmonary nodules previously noted are grossly stable in size and without hypermetabolic activity, although are suboptimally evaluated based on size. Scattered calcified granulomas are also present. ABDOMEN/PELVIS: There is no hypermetabolic activity within the liver, adrenal glands, spleen or pancreas. There is no hypermetabolic nodal activity. Incidental CT findings: There is contour irregularity of the liver suspicious for cirrhosis. The spleen is mildly enlarged. The left kidney demonstrates marked cortical thinning and limited function. Diffuse aortic and branch vessel atherosclerosis. Mild diverticular changes of the sigmoid colon. There are postsurgical changes in the anterior abdominal wall. SKELETON: There is no hypermetabolic activity to suggest osseous metastatic disease. Incidental CT findings: Mild spondylosis. IMPRESSION: 1. The dominant cavitary lesion in the left upper lobe is significantly hypermetabolic, most consistent with primary bronchogenic carcinoma. There is a single hypermetabolic left hilar lymph node. By this examination, findings are consistent with IIB disease (T1c N1 M0). 2. The multiple  other smaller, ill-defined nodules in both lungs are grossly stable, without hypermetabolic activity. Although suboptimally evaluated based on size, the morphology of these nodules on prior CT favors an atypical inflammatory process such as atypical mycobacterial infection. Attention on follow-up recommended. 3. No evidence of metastatic disease in the abdomen or pelvis. 4. Morphologic changes of cirrhosis with mild splenomegaly. 5. **Note De-Identified Hoglund Obfuscation** Carotid, coronary artery and Aortic Atherosclerosis (ICD10-I70.0). Electronically Signed   By: Richardean Sale M.D.   On: 01/11/2021 10:37   DG CHEST PORT 1 VIEW  Result Date: 01/24/2021 CLINICAL DATA:  Status post bronchoscopy EXAM: PORTABLE CHEST 1 VIEW COMPARISON:  June 03, 2020 chest radiograph; PET-CT January 09, 2021. Chest CT December 30, 2020 FINDINGS: No pneumothorax. The recently noted cavitary lesion in the left upper lobe is not well delineated. Other nodular opacities are not well delineated as discrete structures by radiography. There is diffuse interstitial thickening throughout the lungs bilaterally. Ill-defined airspace opacity noted in the left upper lobe. Heart upper normal in size with pulmonary vascularity within normal limits. Prominence in the perihilar regions may well represent a degree of adenopathy. There is aortic atherosclerosis. Evidence of prior trauma involving the lateral left clavicle. IMPRESSION: No pneumothorax. Diffuse interstitial thickening which may represent a degree of noncardiogenic edema. Airspace opacity left upper lobe potentially could represent hemorrhage. No well-defined nodular lesions evident on current radiographic examination. Heart upper normal in size. Suspect hilar adenopathy. Aortic Atherosclerosis (ICD10-I70.0). Electronically Signed   By: Lowella Grip III M.D.   On: 01/24/2021 13:44   DG C-ARM BRONCHOSCOPY  Result Date: 01/24/2021 C-ARM BRONCHOSCOPY: Fluoroscopy was utilized by the requesting physician.  No  radiographic interpretation.    ASSESSMENT: This is a very pleasant 65 years old white female recently diagnosed with a stage IIb (T1c, N1, M0) non-small cell lung cancer, squamous cell carcinoma presented with left upper lobe lung nodule in addition to left hilar adenopathy diagnosed in April 2022.   PLAN: I had a lengthy discussion with the patient and her husband today about her current disease stage, prognosis and treatment options. I personally and independently reviewed the scan images and discussed the result and showed the images to the patient and her husband. I recommended for the patient to complete the staging work-up by ordering MRI of the brain to rule out brain metastasis. The patient is not a good surgical candidate for resection because of her COPD, interstitial lung disease as well as the other comorbidities and being on home oxygen. I discussed with her alternative treatment options including a course of concurrent chemoradiation with weekly carboplatin for AUC of 2 and paclitaxel 45 Mg/M2 for 6-7 weeks. The patient has extensive psoriasis and she will not be a great candidate for any consolidation immunotherapy in the future. I will refer the patient to radiation oncology for evaluation and discussion of the radiotherapy option. I will call her pharmacy with prescription for Compazine 10 mg p.o. every 6 hours as needed for nausea. She is expected to start the first dose of her chemotherapy on Feb 13, 2021. The patient will come back for follow-up visit at that time. For smoking cessation I strongly encouraged the patient to quit smoking and she will try with her husband to quit smoking. She was advised to call immediately if she has any concerning symptoms in the interval.  The patient voices understanding of current disease status and treatment options and is in agreement with the current care plan.  All questions were answered. The patient knows to call the clinic with any  problems, questions or concerns. We can certainly see the patient much sooner if necessary.  Thank you so much for allowing me to participate in the care of Brownie C Gable. I will continue to follow up the patient with you and assist in her care.  The total time spent in the appointment was **Note De-Identified Ekstein Obfuscation** 90 minutes.  Disclaimer: This note was dictated with voice recognition software. Similar sounding words can inadvertently be transcribed and may not be corrected upon review.   Eilleen Kempf Feb 01, 2021, 2:01 PM

## 2021-02-02 ENCOUNTER — Encounter: Payer: Self-pay | Admitting: *Deleted

## 2021-02-02 ENCOUNTER — Other Ambulatory Visit: Payer: Self-pay | Admitting: *Deleted

## 2021-02-02 NOTE — Progress Notes (Signed)
**Note De-Identified Sinclair Obfuscation** Hailey Hernandez is a new patient of Dr. Worthy Flank her treatment plan is 6-7 weeks of concurrent chemo rad and MRI brain.  Patient has an appt with Rad Onc and MRI Brain set up at this time.  I looked at Dr. Julien Nordmann and his PA to see appt availability.  They are both booked on 5/16 but [name] has appt time on 5/18.  I notified scheduling that is ok to start on Wednesday is unable to get her first treatment on Monday 5/16.  I did state that after that week she needs to be back on a Monday for treatment. Scheduling will call patient soon with appts.

## 2021-02-02 NOTE — Progress Notes (Signed)
**Note De-identified Peyser Obfuscation** The proposed treatment discussed in cancer conference is for discussion purpose only and is not a binding recommendation. The patient was not physically examined nor present for their treatment options. Therefore, final treatment plans cannot be decided.  ?

## 2021-02-07 ENCOUNTER — Other Ambulatory Visit: Payer: Self-pay

## 2021-02-07 ENCOUNTER — Ambulatory Visit (HOSPITAL_COMMUNITY)
Admission: RE | Admit: 2021-02-07 | Discharge: 2021-02-07 | Disposition: A | Discharge status: Home / Self Care | Payer: Medicare Other | Source: Ambulatory Visit | Attending: Internal Medicine | Admitting: Internal Medicine

## 2021-02-07 DIAGNOSIS — I739 Peripheral vascular disease, unspecified: Secondary | ICD-10-CM | POA: Diagnosis not present

## 2021-02-07 DIAGNOSIS — C3490 Malignant neoplasm of unspecified part of unspecified bronchus or lung: Secondary | ICD-10-CM | POA: Diagnosis not present

## 2021-02-07 DIAGNOSIS — J3489 Other specified disorders of nose and nasal sinuses: Secondary | ICD-10-CM | POA: Diagnosis not present

## 2021-02-07 DIAGNOSIS — I6381 Other cerebral infarction due to occlusion or stenosis of small artery: Secondary | ICD-10-CM | POA: Diagnosis not present

## 2021-02-07 MED ORDER — GADOBUTROL 1 MMOL/ML IV SOLN
9.0000 mL | Freq: Once | INTRAVENOUS | Status: AC | PRN
  Administered 2021-02-07: 9 mL via INTRAVENOUS

## 2021-02-08 ENCOUNTER — Ambulatory Visit (HOSPITAL_COMMUNITY): Payer: Medicare Other

## 2021-02-08 DIAGNOSIS — G4733 Obstructive sleep apnea (adult) (pediatric): Secondary | ICD-10-CM | POA: Diagnosis not present

## 2021-02-08 DIAGNOSIS — L400 Psoriasis vulgaris: Secondary | ICD-10-CM | POA: Diagnosis not present

## 2021-02-08 DIAGNOSIS — T85511A Breakdown (mechanical) of esophageal anti-reflux device, initial encounter: Secondary | ICD-10-CM | POA: Diagnosis not present

## 2021-02-08 DIAGNOSIS — J449 Chronic obstructive pulmonary disease, unspecified: Secondary | ICD-10-CM | POA: Diagnosis not present

## 2021-02-08 NOTE — Progress Notes (Signed)
**Note De-Identified Grundman Obfuscation** Thoracic Location of Tumor / Histology: NSCLC, squamous cell carcinoma LUL  Patient presented to pulmonary with complaints of progressive dyspnea.  MRI Brain 02/07/2021: No metastatic disease or acute intracranial abnormality identified.  Fairly advanced signal changes of chronic small vessel disease.  Bronchoscopy 01/24/2021:   PET 01/09/2021: The dominant cavitary lesion in the left upper lobe is significantly hypermetabolic, most consistent with primary bronchogenic carcinoma. There is a single hypermetabolic left hilar lymph node. By this examination, findings are consistent with IIB disease (T1c N1 M0).  The multiple other smaller, ill-defined nodules in both lungs are grossly stable, without hypermetabolic activity.  CT chest 12/30/2020: Numerous pulmonary nodules scattered throughout the lungs bilaterally concerning for potential metastatic disease, although atypical infection is difficult to exclude. The largest of these is a cavitary left upper lobe nodule measuring 2.7 x 2.0 x 2.2 cm.  Biopsies of LUL/Lymph nodes 01/24/2021    Tobacco/Marijuana/Snuff/ETOH use: Current Smoker, trying to quit  Past/Anticipated interventions by cardiothoracic surgery, if any:    Past/Anticipated interventions by medical oncology, if any:  Dr. Julien Nordmann 02/01/2021 -I recommended for the patient to complete the staging work-up by ordering MRI of the brain to rule out brain metastasis. -The patient is not a good surgical candidate for resection because of her COPD, interstitial lung disease as well as the other comorbidities and being on home oxygen. -I discussed with her alternative treatment options including a course of concurrent chemoradiation with weekly carboplatin for AUC of 2 and paclitaxel 45 Mg/M2 for 6-7 weeks. -The patient has extensive psoriasis and she will not be a great candidate for any consolidation immunotherapy in the future. -I will refer the patient to radiation oncology for  evaluation and discussion of the radiotherapy option.    Signs/Symptoms  Weight changes, if any: Lost about 80 pounds within the past year.  Has gained about 6 pounds within the last week.  Respiratory complaints, if any: Baseline SOB, on oxygen 3 liters.  Hemoptysis, if any: Productive cough with yellowish sputum, No hemoptysis noted.  Pain issues, if any:  None  SAFETY ISSUES:  Prior radiation? No  Pacemaker/ICD? No  Possible current pregnancy? Hysterectomy  Is the patient on methotrexate? No  Current Complaints / other details:

## 2021-02-09 ENCOUNTER — Ambulatory Visit
Admission: RE | Admit: 2021-02-09 | Discharge: 2021-02-09 | Disposition: A | Discharge status: Home / Self Care | Payer: Medicare Other | Source: Ambulatory Visit | Attending: Radiation Oncology | Admitting: Radiation Oncology

## 2021-02-09 ENCOUNTER — Encounter: Payer: Self-pay | Admitting: Radiation Oncology

## 2021-02-09 ENCOUNTER — Other Ambulatory Visit: Payer: Self-pay

## 2021-02-09 VITALS — BP 139/61 | HR 97 | Temp 98.4°F | Resp 16 | Ht 65.0 in | Wt 210.2 lb

## 2021-02-09 DIAGNOSIS — C3492 Malignant neoplasm of unspecified part of left bronchus or lung: Secondary | ICD-10-CM

## 2021-02-09 DIAGNOSIS — M797 Fibromyalgia: Secondary | ICD-10-CM | POA: Insufficient documentation

## 2021-02-09 DIAGNOSIS — Z79899 Other long term (current) drug therapy: Secondary | ICD-10-CM | POA: Insufficient documentation

## 2021-02-09 DIAGNOSIS — C3412 Malignant neoplasm of upper lobe, left bronchus or lung: Secondary | ICD-10-CM | POA: Insufficient documentation

## 2021-02-09 DIAGNOSIS — R590 Localized enlarged lymph nodes: Secondary | ICD-10-CM | POA: Insufficient documentation

## 2021-02-09 DIAGNOSIS — F329 Major depressive disorder, single episode, unspecified: Secondary | ICD-10-CM | POA: Diagnosis not present

## 2021-02-09 DIAGNOSIS — Z7982 Long term (current) use of aspirin: Secondary | ICD-10-CM | POA: Diagnosis not present

## 2021-02-09 DIAGNOSIS — J449 Chronic obstructive pulmonary disease, unspecified: Secondary | ICD-10-CM | POA: Diagnosis not present

## 2021-02-09 DIAGNOSIS — Z809 Family history of malignant neoplasm, unspecified: Secondary | ICD-10-CM | POA: Diagnosis not present

## 2021-02-09 DIAGNOSIS — F17210 Nicotine dependence, cigarettes, uncomplicated: Secondary | ICD-10-CM | POA: Diagnosis not present

## 2021-02-09 DIAGNOSIS — E785 Hyperlipidemia, unspecified: Secondary | ICD-10-CM | POA: Diagnosis not present

## 2021-02-09 DIAGNOSIS — M129 Arthropathy, unspecified: Secondary | ICD-10-CM | POA: Insufficient documentation

## 2021-02-09 DIAGNOSIS — K219 Gastro-esophageal reflux disease without esophagitis: Secondary | ICD-10-CM | POA: Diagnosis not present

## 2021-02-09 DIAGNOSIS — M353 Polymyalgia rheumatica: Secondary | ICD-10-CM | POA: Insufficient documentation

## 2021-02-09 NOTE — Progress Notes (Signed)
**Note De-Identified [place] [facility]ation** Radiation Oncology         [phone] ________________________________  Name: Hailey Hernandez        MRN: [id number]  Date of Service: 02/09/2021 DOB: 12-10-1955  RF:[id number], [name], DO  Curt Bears, MD     REFERRING PHYSICIAN: Curt Bears, MD   DIAGNOSIS: The encounter diagnosis was Stage II squamous cell carcinoma of left lung ([place]).   HISTORY OF PRESENT ILLNESS: Hailey Hernandez is a 65 y.o. female seen at the request of Dr. Julien Nordmann for a diagnosis of Stage IIB, cT1cN1M0 NSCLC, squamous cell carcinoma of the LUL.  She is followed by Dr. Annamaria Boots in pulmonary with a history of interstitial lung disease, and complained of progressive dyspnea.  CT of the chest on 12/30/2020 showed multiple pulmonary nodules bilaterally, the largest was in the left upper lobe measuring up to 2.7 cm as well as borderline mediastinal and bilateral hilar adenopathy, and right paratracheal adenopathy.  Pet imaging on 01/09/2021 showed hypermetabolism within the left hilar node, and hypermetabolism in the left upper lobe mass.  Bronchoscopy on 01/24/2021 was performed, with endobronchial ultrasound as well.  Final pathology revealed squamous cell carcinoma on the FNA and bronchial brushings of the left upper lobe.  An MRI brain is pending; Dr. Julien Nordmann has recommended chemoradiation.   She is seen today to discuss treatment recommendations for her cancer.     PREVIOUS RADIATION THERAPY: No   PAST MEDICAL HISTORY:  Past Medical History:  Diagnosis Date  . Allergy   . Anxiety disorder   . Arthritis    bil legs  . Asthma   . Cancer ([place])   . Chronic bronchitis   . Complication of anesthesia    slow to wake up, one time she turned blue in recovery  . COPD (chronic obstructive pulmonary disease) ([place])   . Depression   . Diverticulitis   . DJD (degenerative joint disease)    spine  . Dyspnea    O2 at 3 L 24/7  . Fibromyalgia   . GERD (gastroesophageal reflux disease)   . Glaucoma   . History of  thyroid cancer   . HTN (hypertension)   . Hyperlipidemia   . Impaired glucose tolerance 05/08/2013  . OSA (obstructive sleep apnea)    CPAP  last sleep study 8-10 yr.ag0  . Polymyalgia ([place])   . Pre-diabetes   . Psoriasis   . Rhinitis   . Tobacco abuse        PAST SURGICAL HISTORY: Past Surgical History:  Procedure Laterality Date  . APPENDECTOMY    . BRONCHIAL BIOPSY  01/24/2021   Procedure: BRONCHIAL BIOPSIES;  Surgeon: Garner Nash, DO;  Location: [facility];  Service: Pulmonary;;  . BRONCHIAL BRUSHINGS  01/24/2021   Procedure: BRONCHIAL BRUSHINGS;  Surgeon: Garner Nash, DO;  Location: [facility];  Service: Pulmonary;;  . BRONCHIAL NEEDLE ASPIRATION BIOPSY  01/24/2021   Procedure: BRONCHIAL NEEDLE ASPIRATION BIOPSIES;  Surgeon: Garner Nash, DO;  Location: [facility];  Service: Pulmonary;;  . CARPAL TUNNEL RELEASE  10/30/2011   Procedure: CARPAL TUNNEL RELEASE;  Surgeon: Wynonia Sours, MD;  Location: [facility];  Service: Orthopedics;  Laterality: Right;  and mass excision  . CHOLECYSTECTOMY    . CHOLESTEATOMA EXCISION     right ear  . left shoulder spurs    . ORIF right lower leg    . partial throidectomy    . TOTAL ABDOMINAL HYSTERECTOMY    . VIDEO BRONCHOSCOPY WITH ENDOBRONCHIAL **Note De-Identified [place] Obfuscation** NAVIGATION N/A 01/24/2021   Procedure: VIDEO BRONCHOSCOPY WITH ENDOBRONCHIAL NAVIGATION;  Surgeon: Garner Nash, DO;  Location: [facility];  Service: Pulmonary;  Laterality: N/A;  . VIDEO BRONCHOSCOPY WITH ENDOBRONCHIAL ULTRASOUND N/A 01/24/2021   Procedure: VIDEO BRONCHOSCOPY WITH ENDOBRONCHIAL ULTRASOUND;  Surgeon: Garner Nash, DO;  Location: [facility];  Service: Pulmonary;  Laterality: N/A;     FAMILY HISTORY:  Family History  Problem Relation Age of Onset  . Emphysema Sister   . Hypertension Sister   . Heart attack Sister   . Emphysema Sister   . Alpha-1 antitrypsin deficiency Sister   . Alpha-1 antitrypsin deficiency Sister   . Allergies Sister    . Asthma Sister   . Heart disease Mother   . Cancer Mother   . Hyperlipidemia Mother   . Hypertension Mother   . Heart attack Mother   . Heart disease Father   . Heart attack Father   . Cancer Sister   . Cancer Brother   . Hyperlipidemia Brother   . Hypertension Brother   . Tuberculosis Other        grandmother  . Hyperlipidemia Daughter   . Hypertension Daughter   . Hypertension Son      SOCIAL HISTORY:  reports that she has been smoking cigarettes. She has a 45.00 pack-year smoking history. She has never used smokeless tobacco. She reports that she does not drink alcohol and does not use drugs. The patient is married and lives in [place]. She's accompanied by her husband.    ALLERGIES: Doxycycline, Tramadol, Milnacipran, and Apremilast   MEDICATIONS:  Current Outpatient Medications  Medication Sig Dispense Refill  . albuterol (PROVENTIL) (2.5 MG/3ML) 0.083% nebulizer solution Take 3 mLs (2.5 mg total) by nebulization every 4 (four) hours as needed for wheezing or shortness of breath. DX: 496 180 mL 12  . albuterol (VENTOLIN HFA) 108 (90 Base) MCG/ACT inhaler Inhale 2 puffs every 4-6 hors as needed- rescue 18 g 12  . alendronate (FOSAMAX) 70 MG tablet Take 1 tablet (70 mg total) by mouth every 7 (seven) days. Take with a full glass of water on an empty stomach. (Patient taking differently: Take 70 mg by mouth every 7 (seven) days. Take with a full glass of water on an empty stomach. Sunday) 4 tablet 11  . aspirin EC 81 MG tablet Take 81 mg by mouth daily.    Marland Kitchen atorvastatin (LIPITOR) 80 MG tablet TAKE 1 TABLET ONCE DAILY (Patient taking differently: Take 80 mg by mouth at bedtime.) 90 tablet 0  . Budeson-Glycopyrrol-Formoterol (BREZTRI AEROSPHERE) 160-9-4.8 MCG/ACT AERO Inhale 2 puffs into the lungs 2 (two) times daily. 5.9 g 0  . calcium carbonate (CALTRATE 600) 1500 (600 Ca) MG TABS tablet Take 1 tablet (1,500 mg total) by mouth 2 (two) times daily with a meal. 60 tablet 3   . colchicine 0.6 MG tablet TAKE 1 TABLET ONCE DAILY 30 tablet 0  . FLUoxetine (PROZAC) 20 MG capsule TAKE 1 CAPSULE ONCE DAILY (Patient taking differently: Take 20 mg by mouth at bedtime.) 90 capsule 0  . fluticasone (FLONASE) 50 MCG/ACT nasal spray Place 2 sprays into both nostrils daily. 16 g 6  . folic acid (FOLVITE) 1 MG tablet Take 1 mg by mouth at bedtime.    Marland Kitchen lisinopril (ZESTRIL) 20 MG tablet TAKE 1 TABLET BY MOUTH DAILY. (Patient taking differently: Take 20 mg by mouth at bedtime.) 90 tablet 0  . montelukast (SINGULAIR) 10 MG tablet TAKE 1 TABLET EVERY DAY (Patient taking **Note De-Identified [place] Obfuscation** differently: Take 10 mg by mouth at bedtime.) 90 tablet 0  . Nebulizers (COMPRESSOR/NEBULIZER) MISC Use up to 4 times daily when needed 1 each 0  . prochlorperazine (COMPAZINE) 10 MG tablet Take 1 tablet (10 mg total) by mouth every 6 (six) hours as needed for nausea or vomiting. 30 tablet 0  . Respiratory Therapy Supplies (FLUTTER) DEVI Patient to use device for ten minutes, three times daily 1 each 0   No current facility-administered medications for this encounter.     REVIEW OF SYSTEMS: On review of systems, the patient reports that she's been on home O2 for at least 20 years. She currently uses 3 L continuously. She has shortness of breath at rest, and uses a CPAP at night along with O2. She has noticed 2 episodes of hemoptysis since her biopsies. She has also noted 80 pounds of weight loss in the last year, but gained 6 pounds back this week. She has a productive cough as well, but denies any concerns with chest tightness. She denies any pain. No other complaints are verbalized.      PHYSICAL EXAM:  Wt Readings from Last 3 Encounters:  02/09/21 210 lb 3.2 oz (95.3 kg)  02/01/21 205 lb 14.4 oz (93.4 kg)  02/01/21 204 lb 9.6 oz (92.8 kg)   Temp Readings from Last 3 Encounters:  02/01/21 98.6 F (37 C) (Tympanic)  02/01/21 98.2 F (36.8 C) (Temporal)  01/26/21 (!) 96.6 F (35.9 C)   BP Readings from  Last 3 Encounters:  02/01/21 137/60  02/01/21 (!) 154/62  01/26/21 130/62   Pulse Readings from Last 3 Encounters:  02/01/21 (!) 109  02/01/21 88  01/26/21 93   Pain Assessment Pain Score: 0-No pain/10  In general this is a well appearing caucasian female in no acute distress. She's alert and oriented x4 and appropriate throughout the examination. Cardiopulmonary assessment is negative for acute distress and she exhibits normal effort.     ECOG = 0  0 - Asymptomatic (Fully active, able to carry on all predisease activities without restriction)  1 - Symptomatic but completely ambulatory (Restricted in physically strenuous activity but ambulatory and able to carry out work of a light or sedentary nature. For example, light housework, office work)  2 - Symptomatic, <50% in bed during the day (Ambulatory and capable of all self care but unable to carry out any work activities. Up and about more than 50% of waking hours)  3 - Symptomatic, >50% in bed, but not bedbound (Capable of only limited self-care, confined to bed or chair 50% or more of waking hours)  4 - Bedbound (Completely disabled. Cannot carry on any self-care. Totally confined to bed or chair)  5 - Death   Eustace Pen MM, Creech RH, Tormey DC, et al. [phone]). "Toxicity and response criteria of the [facility]". [place] Oncol. 5 (6): 649-55    LABORATORY DATA:  Lab Results  Component Value Date   WBC 5.9 02/01/2021   HGB 12.4 02/01/2021   HCT 41.8 02/01/2021   MCV 101.5 (H) 02/01/2021   PLT 130 (L) 02/01/2021   Lab Results  Component Value Date   NA 145 02/01/2021   K 4.5 02/01/2021   CL 99 02/01/2021   CO2 36 (H) 02/01/2021   Lab Results  Component Value Date   ALT 18 08/05/2020   AST 28 08/05/2020   ALKPHOS 177 (H) 08/05/2020   BILITOT 0.5 08/05/2020      RADIOGRAPHY: MR BRAIN **Note De-Identified [place] Obfuscation** W WO CONTRAST  Result Date: 02/08/2021 CLINICAL DATA:  65 year old female recently diagnosed with  non-small cell lung cancer. Staging. EXAM: MRI HEAD WITHOUT AND WITH CONTRAST TECHNIQUE: Multiplanar, multiecho pulse sequences of the brain and surrounding structures were obtained without and with intravenous contrast. CONTRAST:  37mL GADAVIST GADOBUTROL 1 MMOL/ML IV SOLN COMPARISON:  PET-CT 01/09/2021. Head CT 06/08/2018. FINDINGS: Brain: No restricted diffusion to suggest acute infarction. No midline shift, mass effect, evidence of mass lesion, ventriculomegaly, extra-axial collection or acute intracranial hemorrhage. Cervicomedullary junction and pituitary are within normal limits. Moderate to advanced Patchy and confluent bilateral cerebral white matter T2 and FLAIR hyperintensity, including some involvement of the deep white matter capsules. Superimposed chronic lacunar infarcts in the deep gray matter nuclei, most notably the right thalamus. Mild to moderate patchy signal heterogeneity in the pons. No cortical encephalomalacia. Possible tiny chronic microhemorrhage in the right cerebellum on series 10, image 18, negative cerebellum otherwise. No abnormal enhancement identified. No dural thickening. Vascular: Major intracranial vascular flow voids are preserved. The major dural venous sinuses are enhancing and appear to be patent. Skull and upper cervical spine: Within normal limits for age. Sinuses/Orbits: Postoperative changes to both globes, otherwise negative orbits. Trace paranasal sinus mucosal thickening. Other: Trace mastoid air cell fluid. Minimal retained secretions in the nasopharynx which otherwise appears negative. Grossly normal visible internal auditory structures. Negative visible orbits and scalp. IMPRESSION: 1. No metastatic disease or acute intracranial abnormality identified. 2. Fairly advanced signal changes of chronic small vessel disease. Electronically Signed   By: Genevie Ann M.D.   On: 02/08/2021 06:03   DG CHEST PORT 1 VIEW  Result Date: 01/24/2021 CLINICAL DATA:  Status post  bronchoscopy EXAM: PORTABLE CHEST 1 VIEW COMPARISON:  June 03, 2020 chest radiograph; PET-CT January 09, 2021. Chest CT December 30, 2020 FINDINGS: No pneumothorax. The recently noted cavitary lesion in the left upper lobe is not well delineated. Other nodular opacities are not well delineated as discrete structures by radiography. There is diffuse interstitial thickening throughout the lungs bilaterally. Ill-defined airspace opacity noted in the left upper lobe. Heart upper normal in size with pulmonary vascularity within normal limits. Prominence in the perihilar regions may well represent a degree of adenopathy. There is aortic atherosclerosis. Evidence of prior trauma involving the lateral left clavicle. IMPRESSION: No pneumothorax. Diffuse interstitial thickening which may represent a degree of noncardiogenic edema. Airspace opacity left upper lobe potentially could represent hemorrhage. No well-defined nodular lesions evident on current radiographic examination. Heart upper normal in size. Suspect hilar adenopathy. Aortic Atherosclerosis (ICD10-I70.0). Electronically Signed   By: Lowella Grip III M.D.   On: 01/24/2021 13:44   DG C-ARM BRONCHOSCOPY  Result Date: 01/24/2021 C-ARM BRONCHOSCOPY: Fluoroscopy was utilized by the requesting physician.  No radiographic interpretation.       IMPRESSION/PLAN: 1. Stage IIB, cT1cN1M0 NSCLC, squamous cell carcinoma of the LUL. Dr. Lisbeth Renshaw discusses the pathology findings and reviews the nature of NSCLC. She is not a good candidate for surgical resection, and Dr. Julien Nordmann has recommended definitive chemoradiation.   We discussed the risks, benefits, short, and long term effects of radiotherapy, as well as the curative intent, and the patient is interested in proceeding. Dr. Lisbeth Renshaw discusses the delivery and logistics of radiotherapy and anticipates a course of 6 1/2 weeks of radiotherapy. Written consent is obtained and placed in the chart, a copy was provided to  the patient. She will return on Monday for simulation, and we anticipate starting her treatment on 02/20/21. **Note De-Identified [place] Obfuscation** In a visit lasting 60 minutes, greater than 50% of the time was spent face to face discussing the patient's condition, in preparation for the discussion, and coordinating the patient's care.   The above documentation reflects my direct findings during this shared patient visit. Please see the separate note by Dr. Lisbeth Renshaw on this date for the remainder of the patient's plan of care.    Carola Rhine, [facility]   **Disclaimer: This note was dictated with voice recognition software. Similar sounding words can inadvertently be transcribed and this note may contain transcription errors which may not have been corrected upon publication of note.**

## 2021-02-09 NOTE — Progress Notes (Unsigned)
**Note De-Identified [place] [facility]uscation** Pharmacist Chemotherapy Monitoring - Initial Assessment    Anticipated start date: 02/16/21  Regimen:  . Are orders appropriate based on the patient's diagnosis, regimen, and cycle? Yes . Does the plan date match the patient's scheduled date? Yes . Is the sequencing of drugs appropriate? Yes . Are the premedications appropriate for the patient's regimen? Yes . Prior Authorization for treatment is: Not Started o If applicable, is the correct biosimilar selected based on the patient's insurance? not applicable  Organ Function and Labs: Marland Kitchen Are dose adjustments needed based on the patient's renal function, hepatic function, or hematologic function? No . Are appropriate labs ordered prior to the start of patient's treatment? Yes . Other organ system assessment, if indicated: N/A . The following baseline labs, if indicated, have been ordered: N/A  Dose Assessment: . Are the drug doses appropriate? Yes . Are the following correct: o Drug concentrations Yes o IV fluid compatible with drug Yes o Administration routes Yes o Timing of therapy Yes . If applicable, does the patient have documented access for treatment and/or plans for port-a-cath placement? {YES/NO/NOT APPLICABLE:20182} . If applicable, have lifetime cumulative doses been properly documented and assessed? not applicable Lifetime Dose Tracking  No doses have been documented on this patient for the following tracked chemicals: Doxorubicin, Epirubicin, Idarubicin, Daunorubicin, Mitoxantrone, Bleomycin, Oxaliplatin, Carboplatin, Liposomal Doxorubicin  o   Toxicity Monitoring/Prevention: . The patient has the following take home antiemetics prescribed: Prochlorperazine . The patient has the following take home medications prescribed: N/A . Medication allergies and previous infusion related reactions, if applicable, have been reviewed and addressed. No . The patient's current medication list has been assessed for drug-drug interactions  with their chemotherapy regimen. no significant drug-drug interactions were identified on review.  Order Review: . Are the treatment plan orders signed? Yes . Is the patient scheduled to see a provider prior to their treatment? Yes  I verify that I have reviewed each item in the above checklist and answered each question accordingly.  Claybon Jabs, [facility], 02/09/2021  9:01 AM

## 2021-02-10 ENCOUNTER — Telehealth: Payer: Self-pay | Admitting: *Deleted

## 2021-02-10 NOTE — Telephone Encounter (Signed)
**Note De-Identified [place] Obfuscation** I followed up on Hailey Hernandez schedule. Hailey Hernandez is set up to have SIM and start tx next week. I called to see if Hailey Hernandez had any questions regarding her treatment.  Hailey Hernandez states Hailey Hernandez understands her upcoming appts and that we have been "very nice" in her care.  I appreciated her kind words and asked that Hailey Hernandez reached out to [place] if Hailey Hernandez needed anything.

## 2021-02-13 ENCOUNTER — Ambulatory Visit
Admission: RE | Admit: 2021-02-13 | Discharge: 2021-02-13 | Disposition: A | Discharge status: Home / Self Care | Payer: Medicare Other | Source: Ambulatory Visit | Attending: Radiation Oncology | Admitting: Radiation Oncology

## 2021-02-13 ENCOUNTER — Other Ambulatory Visit: Payer: Self-pay

## 2021-02-13 DIAGNOSIS — Z510 Encounter for antineoplastic radiation therapy: Secondary | ICD-10-CM | POA: Diagnosis not present

## 2021-02-13 DIAGNOSIS — C3412 Malignant neoplasm of upper lobe, left bronchus or lung: Secondary | ICD-10-CM | POA: Insufficient documentation

## 2021-02-13 NOTE — Progress Notes (Signed)
**Note De-Identified Rea Obfuscation** [facility] OFFICE PROGRESS NOTE  Hailey Norlander, DO [place] [address]  DIAGNOSIS: Stage IIb (T1c, N1, M0) non-small cell lung cancer, squamous cell carcinoma presented with left upper lobe lung nodule in addition to left hilar adenopathy diagnosed in April 2022.  PRIOR THERAPY: None  CURRENT THERAPY: Concurrent chemoradiation with weekly carboplatin for AUC of 2 and paclitaxel 45 Mg/M2 for 6-7 weeks. First dose on 02/21/21.  INTERVAL HISTORY: Hailey Hernandez 65 y.o. female returns to the clinic today for a follow-up visit accompanied by her daughter.  The patient was recently diagnosed with stage IIb non-small cell lung cancer.  The patient is not a good candidate for surgical resection due to her COPD, interstitial lung disease, and other comorbidities such as being on oxygen.  Therefore, it is recommended that she start weekly concurrent chemoradiation.  She met with radiation oncology who are planning to start her treatment on 02/20/2021.  She was scheduled to start treatment tomorrow which we will remove and start her next week as scheduled on 02/21/2021.  The patient otherwise is feeling today.  She denies any fever, chills, or night sweats. She gained a few pounds. She reports her baseline dyspnea on exertion and cough which produces yellow sputum. She is on chronic oxygen 3 L [place] nasal cannula. She is trying to quit smoking and is currently smoking 1/2 a pack of cigarettes per day. She sometimes reports chest discomfort which is relieved with tums. She used to take Prilosec but this was discontinued by her nephrologist. She denies any nausea, vomiting, diarrhea, or constipation.  Patient reports an intermittent headache which she thinks is allergy related. She takes Singulair. She recently had a staging brain MRI performed which was negative for metastatic disease to the brain.  She is here today for repeat lab work before starting treatment next week.       MEDICAL HISTORY: Past Medical History:  Diagnosis Date  . Allergy   . Anxiety disorder   . Arthritis    bil legs  . Asthma   . Cancer ([place])   . Chronic bronchitis   . Complication of anesthesia    slow to wake up, one time she turned blue in recovery  . COPD (chronic obstructive pulmonary disease) ([place])   . Depression   . Diverticulitis   . DJD (degenerative joint disease)    spine  . Dyspnea    O2 at 3 L 24/7  . Fibromyalgia   . GERD (gastroesophageal reflux disease)   . Glaucoma   . History of thyroid cancer   . HTN (hypertension)   . Hyperlipidemia   . Impaired glucose tolerance 05/08/2013  . OSA (obstructive sleep apnea)    CPAP  last sleep study 8-10 yr.ag0  . Polymyalgia ([place])   . Pre-diabetes   . Psoriasis   . Rhinitis   . Tobacco abuse     ALLERGIES:  is allergic to doxycycline, tramadol, milnacipran, and apremilast.  MEDICATIONS:  Current Outpatient Medications  Medication Sig Dispense Refill  . albuterol (PROVENTIL) (2.5 MG/3ML) 0.083% nebulizer solution Take 3 mLs (2.5 mg total) by nebulization every 4 (four) hours as needed for wheezing or shortness of breath. DX: 496 180 mL 12  . albuterol (VENTOLIN HFA) 108 (90 Base) MCG/ACT inhaler Inhale 2 puffs every 4-6 hors as needed- rescue 18 g 12  . alendronate (FOSAMAX) 70 MG tablet Take 1 tablet (70 mg total) by mouth every 7 (seven) days. Take with **Note De-Identified Pewitt Obfuscation** a full glass of water on an empty stomach. (Patient taking differently: Take 70 mg by mouth every 7 (seven) days. Take with a full glass of water on an empty stomach. Sunday) 4 tablet 11  . aspirin EC 81 MG tablet Take 81 mg by mouth daily.    Marland Kitchen atorvastatin (LIPITOR) 80 MG tablet TAKE 1 TABLET ONCE DAILY 90 tablet 0  . Budeson-Glycopyrrol-Formoterol (BREZTRI AEROSPHERE) 160-9-4.8 MCG/ACT AERO Inhale 2 puffs into the lungs 2 (two) times daily. 5.9 g 0  . calcium carbonate (CALTRATE 600) 1500 (600 Ca) MG TABS tablet Take 1 tablet (1,500 mg total) by mouth 2  (two) times daily with a meal. 60 tablet 3  . colchicine 0.6 MG tablet TAKE 1 TABLET ONCE DAILY 30 tablet 0  . FLUoxetine (PROZAC) 20 MG capsule TAKE 1 CAPSULE ONCE DAILY 90 capsule 0  . fluticasone (FLONASE) 50 MCG/ACT nasal spray Place 2 sprays into both nostrils daily. 16 g 6  . folic acid (FOLVITE) 1 MG tablet Take 1 mg by mouth at bedtime.    Marland Kitchen lisinopril (ZESTRIL) 20 MG tablet TAKE 1 TABLET BY MOUTH DAILY. 90 tablet 0  . montelukast (SINGULAIR) 10 MG tablet TAKE 1 TABLET EVERY DAY (Patient taking differently: Take 10 mg by mouth at bedtime.) 90 tablet 0  . Nebulizers (COMPRESSOR/NEBULIZER) MISC Use up to 4 times daily when needed 1 each 0  . prochlorperazine (COMPAZINE) 10 MG tablet Take 1 tablet (10 mg total) by mouth every 6 (six) hours as needed for nausea or vomiting. 30 tablet 0  . Respiratory Therapy Supplies (FLUTTER) DEVI Patient to use device for ten minutes, three times daily 1 each 0   No current facility-administered medications for this visit.    SURGICAL HISTORY:  Past Surgical History:  Procedure Laterality Date  . APPENDECTOMY    . BRONCHIAL BIOPSY  01/24/2021   Procedure: BRONCHIAL BIOPSIES;  Surgeon: Garner Nash, DO;  Location: [facility];  Service: Pulmonary;;  . BRONCHIAL BRUSHINGS  01/24/2021   Procedure: BRONCHIAL BRUSHINGS;  Surgeon: Garner Nash, DO;  Location: [facility];  Service: Pulmonary;;  . BRONCHIAL NEEDLE ASPIRATION BIOPSY  01/24/2021   Procedure: BRONCHIAL NEEDLE ASPIRATION BIOPSIES;  Surgeon: Garner Nash, DO;  Location: [facility];  Service: Pulmonary;;  . CARPAL TUNNEL RELEASE  10/30/2011   Procedure: CARPAL TUNNEL RELEASE;  Surgeon: Wynonia Sours, MD;  Location: [facility];  Service: Orthopedics;  Laterality: Right;  and mass excision  . CHOLECYSTECTOMY    . CHOLESTEATOMA EXCISION     right ear  . left shoulder spurs    . ORIF right lower leg    . partial throidectomy    . TOTAL ABDOMINAL HYSTERECTOMY    . VIDEO  BRONCHOSCOPY WITH ENDOBRONCHIAL NAVIGATION N/A 01/24/2021   Procedure: VIDEO BRONCHOSCOPY WITH ENDOBRONCHIAL NAVIGATION;  Surgeon: Garner Nash, DO;  Location: [facility];  Service: Pulmonary;  Laterality: N/A;  . VIDEO BRONCHOSCOPY WITH ENDOBRONCHIAL ULTRASOUND N/A 01/24/2021   Procedure: VIDEO BRONCHOSCOPY WITH ENDOBRONCHIAL ULTRASOUND;  Surgeon: Garner Nash, DO;  Location: [facility];  Service: Pulmonary;  Laterality: N/A;    REVIEW OF SYSTEMS:   Review of Systems  Constitutional: Positive for fatigue. Negative for appetite change, chills, fever and unexpected weight change.  HENT: Negative for mouth sores, nosebleeds, sore throat and trouble swallowing.   Eyes: Negative for eye problems and icterus.  Respiratory: Positive for baseline shortness of breath with exertion and chronic cough. Negative for hemoptysis and wheezing. **Note De-Identified Spindel Obfuscation** Cardiovascular: Positive for intermittent chest discomfort relieved by tums. Negative for leg swelling.  Gastrointestinal: Negative for abdominal pain, constipation, diarrhea, nausea and vomiting.  Genitourinary: Negative for bladder incontinence, difficulty urinating, dysuria, frequency and hematuria.   Musculoskeletal: Negative for back pain, gait problem, neck pain and neck stiffness.  Skin: Negative for itching and rash.  Neurological: Positive for headaches. Negative for dizziness, extremity weakness, gait problem, light-headedness and seizures.  Hematological: Negative for adenopathy. Does not bruise/bleed easily.  Psychiatric/Behavioral: Negative for confusion, depression and sleep disturbance. The patient is not nervous/anxious.     PHYSICAL EXAMINATION:  Blood pressure (!) 149/65, pulse 84, temperature 97.7 F (36.5 C), temperature source Oral, resp. rate 18, weight 210 lb 14.4 oz (95.7 kg), SpO2 98 %.  ECOG PERFORMANCE STATUS: 2 - Symptomatic, <50% confined to bed  Physical Exam  Constitutional: Oriented to person, place, and time and  chronically ill appearing female and in no distress.  HENT:  Head: Normocephalic and atraumatic.  Mouth/Throat: Oropharynx is clear and moist. No oropharyngeal exudate.  Eyes: Conjunctivae are normal. Right eye exhibits no discharge. Left eye exhibits no discharge. No scleral icterus.  Neck: Normal range of motion. Neck supple.  Cardiovascular: Normal rate, regular rhythm, normal heart sounds and intact distal pulses.   Pulmonary/Chest: Effort normal. Quiet breath sounds in all lung fields. No respiratory distress. No wheezes. No rales.  Abdominal: Soft. Bowel sounds are normal. Exhibits no distension and no mass. There is no tenderness.  Musculoskeletal: Normal range of motion. Exhibits no edema.  Lymphadenopathy:    No cervical adenopathy.  Neurological: Alert and oriented to person, place, and time. Exhibits muscle wasting. Examined in the wheelchair.   Skin: Skin is warm and dry. Extensive psoriasis on her extremities. Not diaphoretic. No erythema. No pallor.  Psychiatric: Mood, memory and judgment normal.  Vitals reviewed.  LABORATORY DATA: Lab Results  Component Value Date   WBC 5.7 02/15/2021   HGB 12.2 02/15/2021   HCT 40.3 02/15/2021   MCV 101.0 (H) 02/15/2021   PLT 107 (L) 02/15/2021      Chemistry      Component Value Date/Time   NA 143 02/15/2021 0930   NA 142 08/05/2020 1502   NA 142 08/26/2015 1027   K 4.5 02/15/2021 0930   K 4.3 08/26/2015 1027   CL 99 02/15/2021 0930   CO2 34 (H) 02/15/2021 0930   CO2 34 (H) 08/26/2015 1027   BUN 15 02/15/2021 0930   BUN 14 08/05/2020 1502   BUN 9.6 08/26/2015 1027   CREATININE 0.91 02/15/2021 0930   CREATININE 0.7 08/26/2015 1027      Component Value Date/Time   CALCIUM 9.2 02/15/2021 0930   CALCIUM 9.5 08/26/2015 1027   ALKPHOS 200 (H) 02/15/2021 0930   ALKPHOS 131 08/26/2015 1027   AST 35 02/15/2021 0930   AST 15 08/26/2015 1027   ALT 21 02/15/2021 0930   ALT 15 08/26/2015 1027   BILITOT 0.6 02/15/2021 0930    BILITOT 0.66 08/26/2015 1027       RADIOGRAPHIC STUDIES:  MR BRAIN W WO CONTRAST  Result Date: 02/08/2021 CLINICAL DATA:  66 year old female recently diagnosed with non-small cell lung cancer. Staging. EXAM: MRI HEAD WITHOUT AND WITH CONTRAST TECHNIQUE: Multiplanar, multiecho pulse sequences of the brain and surrounding structures were obtained without and with intravenous contrast. CONTRAST:  63m GADAVIST GADOBUTROL 1 MMOL/ML IV SOLN COMPARISON:  PET-CT 01/09/2021. Head CT 06/08/2018. FINDINGS: Brain: No restricted diffusion to suggest acute infarction. No midline shift, **Note De-Identified Arocho Obfuscation** mass effect, evidence of mass lesion, ventriculomegaly, extra-axial collection or acute intracranial hemorrhage. Cervicomedullary junction and pituitary are within normal limits. Moderate to advanced Patchy and confluent bilateral cerebral white matter T2 and FLAIR hyperintensity, including some involvement of the deep white matter capsules. Superimposed chronic lacunar infarcts in the deep gray matter nuclei, most notably the right thalamus. Mild to moderate patchy signal heterogeneity in the pons. No cortical encephalomalacia. Possible tiny chronic microhemorrhage in the right cerebellum on series 10, image 18, negative cerebellum otherwise. No abnormal enhancement identified. No dural thickening. Vascular: Major intracranial vascular flow voids are preserved. The major dural venous sinuses are enhancing and appear to be patent. Skull and upper cervical spine: Within normal limits for age. Sinuses/Orbits: Postoperative changes to both globes, otherwise negative orbits. Trace paranasal sinus mucosal thickening. Other: Trace mastoid air cell fluid. Minimal retained secretions in the nasopharynx which otherwise appears negative. Grossly normal visible internal auditory structures. Negative visible orbits and scalp. IMPRESSION: 1. No metastatic disease or acute intracranial abnormality identified. 2. Fairly advanced signal changes of chronic  small vessel disease. Electronically Signed   By: Genevie Ann M.D.   On: 02/08/2021 06:03   DG CHEST PORT 1 VIEW  Result Date: 01/24/2021 CLINICAL DATA:  Status post bronchoscopy EXAM: PORTABLE CHEST 1 VIEW COMPARISON:  June 03, 2020 chest radiograph; PET-CT January 09, 2021. Chest CT December 30, 2020 FINDINGS: No pneumothorax. The recently noted cavitary lesion in the left upper lobe is not well delineated. Other nodular opacities are not well delineated as discrete structures by radiography. There is diffuse interstitial thickening throughout the lungs bilaterally. Ill-defined airspace opacity noted in the left upper lobe. Heart upper normal in size with pulmonary vascularity within normal limits. Prominence in the perihilar regions may well represent a degree of adenopathy. There is aortic atherosclerosis. Evidence of prior trauma involving the lateral left clavicle. IMPRESSION: No pneumothorax. Diffuse interstitial thickening which may represent a degree of noncardiogenic edema. Airspace opacity left upper lobe potentially could represent hemorrhage. No well-defined nodular lesions evident on current radiographic examination. Heart upper normal in size. Suspect hilar adenopathy. Aortic Atherosclerosis (ICD10-I70.0). Electronically Signed   By: Lowella Grip III M.D.   On: 01/24/2021 13:44   DG C-ARM BRONCHOSCOPY  Result Date: 01/24/2021 C-ARM BRONCHOSCOPY: Fluoroscopy was utilized by the requesting physician.  No radiographic interpretation.     ASSESSMENT/PLAN:  This is a very pleasant 65 year old Caucasian female recently diagnosed with a stage IIb (T1c, N1, M0) non-small cell lung cancer, squamous cell carcinoma presented with left upper lobe lung nodule in addition to left hilar adenopathy diagnosed in April 2022.  The patient is not a good surgical candidate for resection because of her COPD, interstitial lung disease as well as the other comorbidities and being on home oxygen. Therefore, Dr.  Julien Nordmann recommended a course of concurrent chemoradiation with carboplatin for an AUC of 2 and paclitaxel 45 mg per metered squared.  She is scheduled to start radiation on 02/20/2021. The patient has extensive psoriasis and she will not be a great candidate for any consolidation immunotherapy in the future.  The patient recently had a staging brain MRI performed which is negative for any metastatic disease.  I reviewed them with the patient today.  The patient is scheduled to start treatment tomorrow on 02/16/21; however, we will remove this appointment and start her next week on 02/21/2021 as she is going to start radiation on 02/20/2021.  We will see her back for follow-up visit in **Note De-Identified [place] Obfuscation** 2 weeks for evaluation before starting cycle #2.  The patient is strongly encouraged to quit smoking.   The patient was advised to call immediately if she has any concerning symptoms in the interval. The patient voices understanding of current disease status and treatment options and is in agreement with the current care plan. All questions were answered. The patient knows to call the clinic with any problems, questions or concerns. We can certainly see the patient much sooner if necessary         Orders Placed This Encounter  Procedures  . CBC with Differential (Cancer Center Only)    Standing Status:   Standing    Number of Occurrences:   6    Standing Expiration Date:   02/15/2022  . CMP ([facility] only)    Standing Status:   Standing    Number of Occurrences:   6    Standing Expiration Date:   02/15/2022     I spent 20-29 minutes in this encounter   Georg Ang L Birgit Nowling, PA-C 02/15/21

## 2021-02-14 ENCOUNTER — Telehealth: Payer: Self-pay | Admitting: Radiology

## 2021-02-14 ENCOUNTER — Other Ambulatory Visit: Payer: Self-pay

## 2021-02-14 ENCOUNTER — Other Ambulatory Visit: Payer: Self-pay | Admitting: Family Medicine

## 2021-02-14 DIAGNOSIS — C3492 Malignant neoplasm of unspecified part of left bronchus or lung: Secondary | ICD-10-CM

## 2021-02-14 DIAGNOSIS — F419 Anxiety disorder, unspecified: Secondary | ICD-10-CM

## 2021-02-14 DIAGNOSIS — I10 Essential (primary) hypertension: Secondary | ICD-10-CM

## 2021-02-14 NOTE — Telephone Encounter (Signed)
**Note De-Identified Larin Obfuscation** [facility] - Customer service manager for the Discovery and Validation of Biomarkers for the Prediction, Diagnosis, and Management of Disease  **LATE ENTRY**  02/13/2021    3:15PM  PHONE CALL: Spoke with Fritz Pickerel, patient's husband. Introduced myself as Scientist, physiological and stated reason for call was the inquire about patients interest in participation in the above mentioned study. Described purpose, patient responsibilities, voluntary nature of study, and compensation from this study. Fritz Pickerel stated his wife would be interested. This coordinator requested for patient to arrive around 9-9:15AM Wednesday, 02/15/21 just prior to her lab appointment to review consent and HIPAA documents and collect signature if patient is interested. Fritz Pickerel expressed understanding and verified patient would arrive around 9:15AM. They were thanked for their time.   Carol Ada, RT(R)(T) Clinical Research Coordinator

## 2021-02-15 ENCOUNTER — Telehealth: Payer: Self-pay

## 2021-02-15 ENCOUNTER — Other Ambulatory Visit: Payer: Self-pay | Admitting: *Deleted

## 2021-02-15 ENCOUNTER — Inpatient Hospital Stay: Payer: Medicare Other

## 2021-02-15 ENCOUNTER — Inpatient Hospital Stay: Payer: Medicare Other | Admitting: Radiology

## 2021-02-15 ENCOUNTER — Inpatient Hospital Stay: Payer: Medicare Other | Admitting: Physician Assistant

## 2021-02-15 ENCOUNTER — Other Ambulatory Visit: Payer: Self-pay

## 2021-02-15 ENCOUNTER — Encounter: Payer: Self-pay | Admitting: Physician Assistant

## 2021-02-15 VITALS — BP 149/65 | HR 84 | Temp 97.7°F | Resp 18 | Wt 210.9 lb

## 2021-02-15 DIAGNOSIS — C3412 Malignant neoplasm of upper lobe, left bronchus or lung: Secondary | ICD-10-CM

## 2021-02-15 DIAGNOSIS — R0609 Other forms of dyspnea: Secondary | ICD-10-CM | POA: Diagnosis not present

## 2021-02-15 DIAGNOSIS — C3492 Malignant neoplasm of unspecified part of left bronchus or lung: Secondary | ICD-10-CM

## 2021-02-15 DIAGNOSIS — R519 Headache, unspecified: Secondary | ICD-10-CM | POA: Diagnosis not present

## 2021-02-15 DIAGNOSIS — Z79899 Other long term (current) drug therapy: Secondary | ICD-10-CM | POA: Diagnosis not present

## 2021-02-15 DIAGNOSIS — R0602 Shortness of breath: Secondary | ICD-10-CM | POA: Diagnosis not present

## 2021-02-15 DIAGNOSIS — Z5111 Encounter for antineoplastic chemotherapy: Secondary | ICD-10-CM | POA: Diagnosis not present

## 2021-02-15 DIAGNOSIS — R0789 Other chest pain: Secondary | ICD-10-CM | POA: Diagnosis not present

## 2021-02-15 DIAGNOSIS — R059 Cough, unspecified: Secondary | ICD-10-CM | POA: Diagnosis not present

## 2021-02-15 DIAGNOSIS — I10 Essential (primary) hypertension: Secondary | ICD-10-CM | POA: Diagnosis not present

## 2021-02-15 DIAGNOSIS — J449 Chronic obstructive pulmonary disease, unspecified: Secondary | ICD-10-CM | POA: Diagnosis not present

## 2021-02-15 DIAGNOSIS — Z9981 Dependence on supplemental oxygen: Secondary | ICD-10-CM | POA: Diagnosis not present

## 2021-02-15 DIAGNOSIS — R5383 Other fatigue: Secondary | ICD-10-CM | POA: Diagnosis not present

## 2021-02-15 LAB — CMP (CANCER CENTER ONLY)
ALT: 21 U/L (ref 0–44)
AST: 35 U/L (ref 15–41)
Albumin: 3.3 g/dL — ABNORMAL LOW (ref 3.5–5.0)
Alkaline Phosphatase: 200 U/L — ABNORMAL HIGH (ref 38–126)
Anion gap: 10 (ref 5–15)
BUN: 15 mg/dL (ref 8–23)
CO2: 34 mmol/L — ABNORMAL HIGH (ref 22–32)
Calcium: 9.2 mg/dL (ref 8.9–10.3)
Chloride: 99 mmol/L (ref 98–111)
Creatinine: 0.91 mg/dL (ref 0.44–1.00)
GFR, Estimated: >60 mL/min (ref >60)
Glucose, Bld: 107 mg/dL — ABNORMAL HIGH (ref 70–99)
Potassium: 4.5 mmol/L (ref 3.5–5.1)
Sodium: 143 mmol/L (ref 135–145)
Total Bilirubin: 0.6 mg/dL (ref 0.3–1.2)
Total Protein: 7.9 g/dL (ref 6.5–8.1)

## 2021-02-15 LAB — CBC WITH DIFFERENTIAL (CANCER CENTER ONLY)
Abs Immature Granulocytes: 0.03 10*3/uL (ref 0.00–0.07)
Basophils Absolute: 0 10*3/uL (ref 0.0–0.1)
Basophils Relative: 0 %
Eosinophils Absolute: 0.2 10*3/uL (ref 0.0–0.5)
Eosinophils Relative: 4 %
HCT: 40.3 % (ref 36.0–46.0)
Hemoglobin: 12.2 g/dL (ref 12.0–15.0)
Immature Granulocytes: 1 %
Lymphocytes Relative: 23 %
Lymphs Abs: 1.3 10*3/uL (ref 0.7–4.0)
MCH: 30.6 pg (ref 26.0–34.0)
MCHC: 30.3 g/dL (ref 30.0–36.0)
MCV: 101 fL — ABNORMAL HIGH (ref 80.0–100.0)
Monocytes Absolute: 0.6 10*3/uL (ref 0.1–1.0)
Monocytes Relative: 10 %
Neutro Abs: 3.6 10*3/uL (ref 1.7–7.7)
Neutrophils Relative %: 62 %
Platelet Count: 107 10*3/uL — ABNORMAL LOW (ref 150–400)
RBC: 3.99 MIL/uL (ref 3.87–5.11)
RDW: 15.5 % (ref 11.5–15.5)
WBC Count: 5.7 10*3/uL (ref 4.0–10.5)
nRBC: 0 % (ref 0.0–0.2)

## 2021-02-15 LAB — RESEARCH LABS

## 2021-02-15 NOTE — Research (Signed)
**Note De-Identified Bussey Obfuscation** [facility] - Customer service manager for the Discovery and Validation of Biomarkers for the Prediction, Diagnosis, and Management of Disease  02/15/2021    8:50AM  CONSENT: Patient Hailey Hernandez was identified by this clinical Research officer, political party as a potential candidate for the above listed study.  This Clinical Research Coordinator met with Hailey Hernandez, [id number], on 02/15/21 in a manner and location that ensures patient privacy to discuss participation in the above listed research study.  Patient is Accompanied by her daughter, Hailey Hernandez.  A copy of the informed consent document and separate HIPAA Authorization was provided to the patient.  Patient reads, speaks, and understands [place].    Patient was provided the option of taking informed consent documents home to review and was encouraged to review at their convenience with their support network, including other care providers. Patient is comfortable with making a decision regarding study participation today.  As outlined in the informed consent form, this Coordinator and Jannett C Leichter discussed the purpose of the research study, the investigational nature of the study, study procedures and requirements for study participation, potential risks and benefits of study participation, as well as alternatives to participation. The patient understands participation is voluntary and they may withdraw from study participation at any time.    Confidentiality and how the patient's information will be used as part of study participation were discussed.  Patient was informed there is reimbursement provided for their time and effort spent on trial participation.     All questions were answered to patient's satisfaction.  The informed consent and separate HIPAA Authorization was reviewed page by page.  The patient's mental and emotional status is appropriate to provide informed consent, and the patient verbalizes an understanding of study  participation.  Patient has agreed to participate in the above listed research study and has voluntarily signed the informed consent date approved version 05/06/2020;valid until 05/05/2021 and separate HIPAA Authorization, version 5 on 02/15/21 at 9:06 AM.  The patient was provided with a copy of the signed informed consent form and separate HIPAA Authorization for their reference.  No study specific procedures were obtained prior to the signing of the informed consent document.  Approximately 15 minutes were spent with the patient reviewing the informed consent documents.  Patient was thanked for her time and participation in the above listed study.   LABS: A research lab order was added by Hailey Johns, RN, Clinical Research Nurse II. Patient and daughter were escorted to the lab and research lab tubes were provided to the lab staff. Labs were collected at 930am by Hailey Hernandez per protocol and patient had no adverse events.  GIFT CARD: Patient was then given the $50 gift card and a signature of attestation was signed. Patient was thanked for her time.   Hailey Hernandez, RT(R)(T) Clinical Research Coordinator

## 2021-02-15 NOTE — Telephone Encounter (Signed)
**Note De-Identified [place] Obfuscation** 02/16/21 tx cancelled per 02/15/21 sch message    Hailey Hernandez

## 2021-02-16 ENCOUNTER — Inpatient Hospital Stay: Payer: Medicare Other

## 2021-02-16 ENCOUNTER — Ambulatory Visit
Admission: RE | Admit: 2021-02-16 | Discharge: 2021-02-16 | Disposition: A | Discharge status: Home / Self Care | Payer: Medicare Other | Source: Ambulatory Visit | Attending: Radiation Oncology | Admitting: Radiation Oncology

## 2021-02-16 ENCOUNTER — Other Ambulatory Visit: Payer: Self-pay

## 2021-02-16 ENCOUNTER — Ambulatory Visit: Payer: Medicare Other | Admitting: Radiation Oncology

## 2021-02-17 DIAGNOSIS — Z510 Encounter for antineoplastic radiation therapy: Secondary | ICD-10-CM | POA: Diagnosis not present

## 2021-02-17 DIAGNOSIS — C3412 Malignant neoplasm of upper lobe, left bronchus or lung: Secondary | ICD-10-CM | POA: Diagnosis not present

## 2021-02-20 ENCOUNTER — Other Ambulatory Visit: Payer: Self-pay

## 2021-02-20 ENCOUNTER — Ambulatory Visit
Admission: RE | Admit: 2021-02-20 | Discharge: 2021-02-20 | Disposition: A | Discharge status: Home / Self Care | Payer: Medicare Other | Source: Ambulatory Visit | Attending: Radiation Oncology | Admitting: Radiation Oncology

## 2021-02-20 DIAGNOSIS — C3412 Malignant neoplasm of upper lobe, left bronchus or lung: Secondary | ICD-10-CM | POA: Diagnosis not present

## 2021-02-20 DIAGNOSIS — Z510 Encounter for antineoplastic radiation therapy: Secondary | ICD-10-CM | POA: Diagnosis not present

## 2021-02-21 ENCOUNTER — Inpatient Hospital Stay: Payer: Medicare Other

## 2021-02-21 ENCOUNTER — Ambulatory Visit
Admission: RE | Admit: 2021-02-21 | Discharge: 2021-02-21 | Disposition: A | Discharge status: Home / Self Care | Payer: Medicare Other | Source: Ambulatory Visit | Attending: Radiation Oncology | Admitting: Radiation Oncology

## 2021-02-21 VITALS — BP 125/60 | HR 88 | Temp 98.0°F | Resp 18

## 2021-02-21 DIAGNOSIS — Z5111 Encounter for antineoplastic chemotherapy: Secondary | ICD-10-CM | POA: Diagnosis not present

## 2021-02-21 DIAGNOSIS — C3412 Malignant neoplasm of upper lobe, left bronchus or lung: Secondary | ICD-10-CM | POA: Diagnosis not present

## 2021-02-21 DIAGNOSIS — I10 Essential (primary) hypertension: Secondary | ICD-10-CM | POA: Diagnosis not present

## 2021-02-21 DIAGNOSIS — R0789 Other chest pain: Secondary | ICD-10-CM | POA: Diagnosis not present

## 2021-02-21 DIAGNOSIS — R0609 Other forms of dyspnea: Secondary | ICD-10-CM | POA: Diagnosis not present

## 2021-02-21 DIAGNOSIS — Z79899 Other long term (current) drug therapy: Secondary | ICD-10-CM | POA: Diagnosis not present

## 2021-02-21 DIAGNOSIS — J449 Chronic obstructive pulmonary disease, unspecified: Secondary | ICD-10-CM | POA: Diagnosis not present

## 2021-02-21 DIAGNOSIS — Z9981 Dependence on supplemental oxygen: Secondary | ICD-10-CM | POA: Diagnosis not present

## 2021-02-21 DIAGNOSIS — R5383 Other fatigue: Secondary | ICD-10-CM | POA: Diagnosis not present

## 2021-02-21 DIAGNOSIS — R059 Cough, unspecified: Secondary | ICD-10-CM | POA: Diagnosis not present

## 2021-02-21 DIAGNOSIS — Z510 Encounter for antineoplastic radiation therapy: Secondary | ICD-10-CM | POA: Diagnosis not present

## 2021-02-21 DIAGNOSIS — R519 Headache, unspecified: Secondary | ICD-10-CM | POA: Diagnosis not present

## 2021-02-21 DIAGNOSIS — R0602 Shortness of breath: Secondary | ICD-10-CM | POA: Diagnosis not present

## 2021-02-21 LAB — CMP (CANCER CENTER ONLY)
ALT: 17 U/L (ref 0–44)
AST: 29 U/L (ref 15–41)
Albumin: 3.3 g/dL — ABNORMAL LOW (ref 3.5–5.0)
Alkaline Phosphatase: 196 U/L — ABNORMAL HIGH (ref 38–126)
Anion gap: 8 (ref 5–15)
BUN: 18 mg/dL (ref 8–23)
CO2: 32 mmol/L (ref 22–32)
Calcium: 8.9 mg/dL (ref 8.9–10.3)
Chloride: 101 mmol/L (ref 98–111)
Creatinine: 0.91 mg/dL (ref 0.44–1.00)
GFR, Estimated: >60 mL/min (ref >60)
Glucose, Bld: 111 mg/dL — ABNORMAL HIGH (ref 70–99)
Potassium: 4.2 mmol/L (ref 3.5–5.1)
Sodium: 141 mmol/L (ref 135–145)
Total Bilirubin: 0.5 mg/dL (ref 0.3–1.2)
Total Protein: 7.7 g/dL (ref 6.5–8.1)

## 2021-02-21 LAB — CBC WITH DIFFERENTIAL (CANCER CENTER ONLY)
Abs Immature Granulocytes: 0.01 10*3/uL (ref 0.00–0.07)
Basophils Absolute: 0 10*3/uL (ref 0.0–0.1)
Basophils Relative: 0 %
Eosinophils Absolute: 0.2 10*3/uL (ref 0.0–0.5)
Eosinophils Relative: 4 %
HCT: 39.2 % (ref 36.0–46.0)
Hemoglobin: 12 g/dL (ref 12.0–15.0)
Immature Granulocytes: 0 %
Lymphocytes Relative: 23 %
Lymphs Abs: 1.3 10*3/uL (ref 0.7–4.0)
MCH: 30.8 pg (ref 26.0–34.0)
MCHC: 30.6 g/dL (ref 30.0–36.0)
MCV: 100.5 fL — ABNORMAL HIGH (ref 80.0–100.0)
Monocytes Absolute: 0.6 10*3/uL (ref 0.1–1.0)
Monocytes Relative: 10 %
Neutro Abs: 3.6 10*3/uL (ref 1.7–7.7)
Neutrophils Relative %: 63 %
Platelet Count: 98 10*3/uL — ABNORMAL LOW (ref 150–400)
RBC: 3.9 MIL/uL (ref 3.87–5.11)
RDW: 15.1 % (ref 11.5–15.5)
WBC Count: 5.7 10*3/uL (ref 4.0–10.5)
nRBC: 0 % (ref 0.0–0.2)

## 2021-02-21 MED ORDER — DIPHENHYDRAMINE HCL 50 MG/ML IJ SOLN
INTRAMUSCULAR | Status: AC
  Filled 2021-02-21: qty 1

## 2021-02-21 MED ORDER — FAMOTIDINE 20 MG IN NS 100 ML IVPB
20.0000 mg | Freq: Once | INTRAVENOUS | Status: AC
Start: 2021-02-21 — End: 2021-02-21
  Administered 2021-02-21: 20 mg via INTRAVENOUS

## 2021-02-21 MED ORDER — SODIUM CHLORIDE 0.9 % IV SOLN
20.0000 mg | Freq: Once | INTRAVENOUS | Status: DC
  Filled 2021-02-21: qty 2

## 2021-02-21 MED ORDER — DIPHENHYDRAMINE HCL 50 MG/ML IJ SOLN
50.0000 mg | Freq: Once | INTRAMUSCULAR | Status: AC
  Administered 2021-02-21: 50 mg via INTRAVENOUS

## 2021-02-21 MED ORDER — SODIUM CHLORIDE 0.9 % IV SOLN
173.6000 mg | Freq: Once | INTRAVENOUS | Status: AC
  Administered 2021-02-21: 170 mg via INTRAVENOUS
  Filled 2021-02-21: qty 17

## 2021-02-21 MED ORDER — PALONOSETRON HCL INJECTION 0.25 MG/5ML
INTRAVENOUS | Status: AC
  Filled 2021-02-21: qty 5

## 2021-02-21 MED ORDER — SODIUM CHLORIDE 0.9 % IV SOLN
Freq: Once | INTRAVENOUS | Status: AC
  Filled 2021-02-21: qty 250

## 2021-02-21 MED ORDER — SODIUM CHLORIDE 0.9 % IV SOLN
45.0000 mg/m2 | Freq: Once | INTRAVENOUS | Status: AC
  Administered 2021-02-21: 96 mg via INTRAVENOUS
  Filled 2021-02-21: qty 16

## 2021-02-21 MED ORDER — PALONOSETRON HCL INJECTION 0.25 MG/5ML
0.2500 mg | Freq: Once | INTRAVENOUS | Status: AC
  Administered 2021-02-21: 0.25 mg via INTRAVENOUS

## 2021-02-21 MED ORDER — SODIUM CHLORIDE 0.9 % IV SOLN
20.0000 mg | Freq: Once | INTRAVENOUS | Status: AC
  Administered 2021-02-21: 20 mg via INTRAVENOUS
  Filled 2021-02-21: qty 20

## 2021-02-21 MED ORDER — FAMOTIDINE 20 MG IN NS 100 ML IVPB
INTRAVENOUS | Status: AC
  Filled 2021-02-21: qty 100

## 2021-02-21 NOTE — Progress Notes (Signed)
**Note De-Identified Hailey Hernandez** Ok to treat with plt 98

## 2021-02-21 NOTE — Patient Instructions (Signed)
**Note De-Identified [place] Obfuscation** [facility]  Discharge Instructions: Thank you for choosing [facility] to provide your oncology and hematology care.   If you have a lab appointment with the [facility], please go directly to the [facility] and check in at the registration area.   Wear comfortable clothing and clothing appropriate for easy access to any Portacath or PICC line.   We strive to give you quality time with your provider. You may need to reschedule your appointment if you arrive late (15 or more minutes).  Arriving late affects you and other patients whose appointments are after yours.  Also, if you miss three or more appointments without notifying the office, you may be dismissed from the clinic at the provider's discretion.      For prescription refill requests, have your pharmacy contact our office and allow 72 hours for refills to be completed.    Today you received the following chemotherapy and/or immunotherapy agents paclitaxel, carboplatin      To help prevent nausea and vomiting after your treatment, we encourage you to take your nausea medication as directed.  BELOW ARE SYMPTOMS THAT SHOULD BE REPORTED IMMEDIATELY: . *FEVER GREATER THAN 100.4 F (38 C) OR HIGHER . *CHILLS OR SWEATING . *NAUSEA AND VOMITING THAT IS NOT CONTROLLED WITH YOUR NAUSEA MEDICATION . *UNUSUAL SHORTNESS OF BREATH . *UNUSUAL BRUISING OR BLEEDING . *URINARY PROBLEMS (pain or burning when urinating, or frequent urination) . *BOWEL PROBLEMS (unusual diarrhea, constipation, pain near the anus) . TENDERNESS IN MOUTH AND THROAT WITH OR WITHOUT PRESENCE OF ULCERS (sore throat, sores in mouth, or a toothache) . UNUSUAL RASH, SWELLING OR PAIN  . UNUSUAL VAGINAL DISCHARGE OR ITCHING   Items with * indicate a potential emergency and should be followed up as soon as possible or go to the Emergency Department if any problems should occur.  Please show the CHEMOTHERAPY ALERT CARD or  IMMUNOTHERAPY ALERT CARD at check-in to the Emergency Department and triage nurse.  Should you have questions after your visit or need to cancel or reschedule your appointment, please contact [facility]  Dept: [phone]  and follow the prompts.  Office hours are 8:00 a.m. to 4:30 p.m. Monday - Friday. Please note that voicemails left after 4:00 p.m. may not be returned until the following business day.  We are closed weekends and major holidays. You have access to a nurse at all times for urgent questions. Please call the main number to the clinic Dept: [phone] and follow the prompts.   For any non-urgent questions, you may also contact your provider using MyChart. We now offer e-Visits for anyone 31 and older to request care online for non-urgent symptoms. For details visit [email or web address].   Also download the MyChart app! Go to the app store, search "MyChart", open the app, select [facility], and log in with your MyChart username and password.  Due to Covid, a mask is required upon entering the hospital/clinic. If you do not have a mask, one will be given to you upon arrival. For doctor visits, patients may have 1 support person aged 89 or older with them. For treatment visits, patients cannot have anyone with them due to current Covid guidelines and our immunocompromised population.

## 2021-02-22 ENCOUNTER — Other Ambulatory Visit: Payer: Medicare Other

## 2021-02-22 ENCOUNTER — Other Ambulatory Visit: Payer: Self-pay

## 2021-02-22 ENCOUNTER — Other Ambulatory Visit: Payer: Self-pay | Admitting: Family Medicine

## 2021-02-22 ENCOUNTER — Ambulatory Visit
Admission: RE | Admit: 2021-02-22 | Discharge: 2021-02-22 | Disposition: A | Discharge status: Home / Self Care | Payer: Medicare Other | Source: Ambulatory Visit | Attending: Radiation Oncology | Admitting: Radiation Oncology

## 2021-02-22 DIAGNOSIS — Z510 Encounter for antineoplastic radiation therapy: Secondary | ICD-10-CM | POA: Diagnosis not present

## 2021-02-22 DIAGNOSIS — C3412 Malignant neoplasm of upper lobe, left bronchus or lung: Secondary | ICD-10-CM | POA: Diagnosis not present

## 2021-02-23 ENCOUNTER — Ambulatory Visit
Admission: RE | Admit: 2021-02-23 | Discharge: 2021-02-23 | Disposition: A | Discharge status: Home / Self Care | Payer: Medicare Other | Source: Ambulatory Visit | Attending: Radiation Oncology | Admitting: Radiation Oncology

## 2021-02-23 DIAGNOSIS — Z510 Encounter for antineoplastic radiation therapy: Secondary | ICD-10-CM | POA: Diagnosis not present

## 2021-02-23 DIAGNOSIS — C3412 Malignant neoplasm of upper lobe, left bronchus or lung: Secondary | ICD-10-CM | POA: Diagnosis not present

## 2021-02-23 NOTE — Progress Notes (Signed)
**Note De-Identified Dimon Obfuscation** [facility] OFFICE PROGRESS NOTE  [name], DO [place] [address]  DIAGNOSIS: Stage IIb (T1c, N1, M0) non-small cell lung cancer, squamous cell carcinoma presented with left upper lobe lung nodule in addition to left hilar adenopathy diagnosed in April 2022.  PRIOR THERAPY: None  CURRENT THERAPY: Concurrent chemoradiation with weekly carboplatin for AUC of 2 and paclitaxel 45 Mg/M2 for 6-7 weeks. First dose on 02/21/21. Status post 1 cycle.   INTERVAL HISTORY: Inice Sanluis Navarra 65 y.o. female returns to the clinic today for a follow-up visit accompanied by her husband. The patient was recently diagnosed with stage IIb non-small cell lung cancer.  The patient is not a good candidate for surgical resection due to her COPD, interstitial lung disease, and other comorbidities such as being on oxygen.  Therefore, it is recommended that she start weekly concurrent chemoradiation. She started treatment on 02/21/21. She tolerated her first treatment well.   The patient otherwise is feeling today.  She denies any fever, chills, or night sweats. She gained a few pounds. She reports her baseline dyspnea on exertion and cough which produces yellow sputum. She is on chronic oxygen 3 L [place] nasal cannula. She is trying to quit smoking and is currently smoking 1/2 a pack of cigarettes per day. She denies any nausea and vomiting.  She sometimes has intermittent diarrhea or constipation. She had on episode of diarrhea but it resolved without intervention. She sometimes has intermittent headache which she thinks is allergy related. She takes Singulair. She recently had a staging brain MRI performed which was negative for metastatic disease to the brain.  She is here today for evaluation before starting cycle #2.    MEDICAL HISTORY: Past Medical History:  Diagnosis Date  . Allergy   . Anxiety disorder   . Arthritis    bil legs  . Asthma   . Cancer ([place])   . Chronic  bronchitis   . Complication of anesthesia    slow to wake up, one time she turned blue in recovery  . COPD (chronic obstructive pulmonary disease) ([place])   . Depression   . Diverticulitis   . DJD (degenerative joint disease)    spine  . Dyspnea    O2 at 3 L 24/7  . Fibromyalgia   . GERD (gastroesophageal reflux disease)   . Glaucoma   . History of thyroid cancer   . HTN (hypertension)   . Hyperlipidemia   . Impaired glucose tolerance 05/08/2013  . OSA (obstructive sleep apnea)    CPAP  last sleep study 8-10 yr.ag0  . Polymyalgia ([place])   . Pre-diabetes   . Psoriasis   . Rhinitis   . Tobacco abuse     ALLERGIES:  is allergic to doxycycline, tramadol, milnacipran, and apremilast.  MEDICATIONS:  Current Outpatient Medications  Medication Sig Dispense Refill  . albuterol (PROVENTIL) (2.5 MG/3ML) 0.083% nebulizer solution Take 3 mLs (2.5 mg total) by nebulization every 4 (four) hours as needed for wheezing or shortness of breath. DX: 496 180 mL 12  . albuterol (VENTOLIN HFA) 108 (90 Base) MCG/ACT inhaler Inhale 2 puffs every 4-6 hors as needed- rescue 18 g 12  . alendronate (FOSAMAX) 70 MG tablet Take 1 tablet (70 mg total) by mouth every 7 (seven) days. Take with a full glass of water on an empty stomach. (Patient taking differently: Take 70 mg by mouth every 7 (seven) days. Take with a full glass of water on an empty **Note De-Identified Keller Obfuscation** stomach. Sunday) 4 tablet 11  . aspirin EC 81 MG tablet Take 81 mg by mouth daily.    Marland Kitchen atorvastatin (LIPITOR) 80 MG tablet TAKE 1 TABLET ONCE DAILY 90 tablet 0  . Budeson-Glycopyrrol-Formoterol (BREZTRI AEROSPHERE) 160-9-4.8 MCG/ACT AERO Inhale 2 puffs into the lungs 2 (two) times daily. 5.9 g 0  . calcium carbonate (CALTRATE 600) 1500 (600 Ca) MG TABS tablet Take 1 tablet (1,500 mg total) by mouth 2 (two) times daily with a meal. 60 tablet 3  . colchicine 0.6 MG tablet TAKE 1 TABLET ONCE DAILY 30 tablet 0  . FLUoxetine (PROZAC) 20 MG capsule TAKE 1 CAPSULE ONCE DAILY  90 capsule 0  . fluticasone (FLONASE) 50 MCG/ACT nasal spray Place 2 sprays into both nostrils daily. 16 g 6  . folic acid (FOLVITE) 1 MG tablet Take 1 mg by mouth at bedtime.    Marland Kitchen lisinopril (ZESTRIL) 20 MG tablet TAKE 1 TABLET BY MOUTH DAILY. 90 tablet 0  . montelukast (SINGULAIR) 10 MG tablet TAKE 1 TABLET EVERY DAY (Patient taking differently: Take 10 mg by mouth at bedtime.) 90 tablet 0  . Nebulizers (COMPRESSOR/NEBULIZER) MISC Use up to 4 times daily when needed 1 each 0  . prochlorperazine (COMPAZINE) 10 MG tablet Take 1 tablet (10 mg total) by mouth every 6 (six) hours as needed for nausea or vomiting. 30 tablet 0  . Respiratory Therapy Supplies (FLUTTER) DEVI Patient to use device for ten minutes, three times daily 1 each 0   No current facility-administered medications for this visit.    SURGICAL HISTORY:  Past Surgical History:  Procedure Laterality Date  . APPENDECTOMY    . BRONCHIAL BIOPSY  01/24/2021   Procedure: BRONCHIAL BIOPSIES;  Surgeon: Garner Nash, DO;  Location: [facility];  Service: Pulmonary;;  . BRONCHIAL BRUSHINGS  01/24/2021   Procedure: BRONCHIAL BRUSHINGS;  Surgeon: Garner Nash, DO;  Location: [facility];  Service: Pulmonary;;  . BRONCHIAL NEEDLE ASPIRATION BIOPSY  01/24/2021   Procedure: BRONCHIAL NEEDLE ASPIRATION BIOPSIES;  Surgeon: Garner Nash, DO;  Location: [facility];  Service: Pulmonary;;  . CARPAL TUNNEL RELEASE  10/30/2011   Procedure: CARPAL TUNNEL RELEASE;  Surgeon: Wynonia Sours, MD;  Location: [facility];  Service: Orthopedics;  Laterality: Right;  and mass excision  . CHOLECYSTECTOMY    . CHOLESTEATOMA EXCISION     right ear  . left shoulder spurs    . ORIF right lower leg    . partial throidectomy    . TOTAL ABDOMINAL HYSTERECTOMY    . VIDEO BRONCHOSCOPY WITH ENDOBRONCHIAL NAVIGATION N/A 01/24/2021   Procedure: VIDEO BRONCHOSCOPY WITH ENDOBRONCHIAL NAVIGATION;  Surgeon: Garner Nash, DO;  Location: [facility];  Service: Pulmonary;  Laterality: N/A;  . VIDEO BRONCHOSCOPY WITH ENDOBRONCHIAL ULTRASOUND N/A 01/24/2021   Procedure: VIDEO BRONCHOSCOPY WITH ENDOBRONCHIAL ULTRASOUND;  Surgeon: Garner Nash, DO;  Location: [facility];  Service: Pulmonary;  Laterality: N/A;    REVIEW OF SYSTEMS:   Review of Systems  Constitutional: Positive for fatigue. Negative for appetite change, chills, fever and unexpected weight change.  HENT: Negative for mouth sores, nosebleeds, sore throat and trouble swallowing.   Eyes: Negative for eye problems and icterus.  Respiratory: Positive for baseline shortness of breath with exertion and chronic cough. Negative for hemoptysis and wheezing.   Cardiovascular: . Negative for chest pain or leg swelling.  Gastrointestinal: Positive for intermittent diarrhea or constipation. Negative for abdominal pain, nausea and vomiting.  Genitourinary: Negative for bladder **Note De-Identified Whyte Obfuscation** incontinence, difficulty urinating, dysuria, frequency and hematuria.   Musculoskeletal: Negative for back pain, gait problem, neck pain and neck stiffness.  Skin: Negative for itching and rash.  Neurological: Positive for intermittent headaches. Negative for dizziness, extremity weakness, gait problem, light-headedness and seizures.  Hematological: Negative for adenopathy. Does not bruise/bleed easily.  Psychiatric/Behavioral: Negative for confusion, depression and sleep disturbance. The patient is not nervous/anxious   PHYSICAL EXAMINATION:  Blood pressure 135/67, pulse 93, temperature 98.3 F (36.8 C), temperature source Oral, resp. rate 20, height 5\' 5"  (1.651 m), weight 214 lb 6.4 oz (97.3 kg), SpO2 96 %, peak flow (!) 3 L/min.  ECOG PERFORMANCE STATUS: 2 - Symptomatic, <50% confined to bed  Physical Exam  Constitutional: Oriented to person, place, and time and chronically ill appearing female and in no distress.  HENT:  Head: Normocephalic and atraumatic.  Mouth/Throat: Oropharynx is clear and  moist. No oropharyngeal exudate.  Eyes: Conjunctivae are normal. Right eye exhibits no discharge. Left eye exhibits no discharge. No scleral icterus.  Neck: Normal range of motion. Neck supple.  Cardiovascular: Normal rate, regular rhythm, normal heart sounds and intact distal pulses.   Pulmonary/Chest: Effort normal. Quiet breath sounds in all lung fields. No respiratory distress. No wheezes. No rales.  Abdominal: Soft. Bowel sounds are normal. Exhibits no distension and no mass. There is no tenderness.  Musculoskeletal: Normal range of motion. Exhibits no edema.  Lymphadenopathy:    No cervical adenopathy.  Neurological: Alert and oriented to person, place, and time. Exhibits muscle wasting. Examined in the wheelchair.   Skin: Skin is warm and dry. Extensive psoriasis on her extremities. Not diaphoretic. No erythema. No pallor.  Psychiatric: Mood, memory and judgment normal.  Vitals reviewed.  LABORATORY DATA: Lab Results  Component Value Date   WBC 4.3 02/28/2021   HGB 11.0 (L) 02/28/2021   HCT 36.3 02/28/2021   MCV 100.3 (H) 02/28/2021   PLT 95 (L) 02/28/2021      Chemistry      Component Value Date/Time   NA 143 02/28/2021 0820   NA 142 08/05/2020 1502   NA 142 08/26/2015 1027   K 5.0 02/28/2021 0820   K 4.3 08/26/2015 1027   CL 102 02/28/2021 0820   CO2 31 02/28/2021 0820   CO2 34 (H) 08/26/2015 1027   BUN 22 02/28/2021 0820   BUN 14 08/05/2020 1502   BUN 9.6 08/26/2015 1027   CREATININE 0.84 02/28/2021 0820   CREATININE 0.7 08/26/2015 1027      Component Value Date/Time   CALCIUM 9.2 02/28/2021 0820   CALCIUM 9.5 08/26/2015 1027   ALKPHOS 170 (H) 02/28/2021 0820   ALKPHOS 131 08/26/2015 1027   AST 27 02/28/2021 0820   AST 15 08/26/2015 1027   ALT 18 02/28/2021 0820   ALT 15 08/26/2015 1027   BILITOT 0.5 02/28/2021 0820   BILITOT 0.66 08/26/2015 1027       RADIOGRAPHIC STUDIES:  MR BRAIN W WO CONTRAST  Result Date: 02/08/2021 CLINICAL DATA:   65 year old female recently diagnosed with non-small cell lung cancer. Staging. EXAM: MRI HEAD WITHOUT AND WITH CONTRAST TECHNIQUE: Multiplanar, multiecho pulse sequences of the brain and surrounding structures were obtained without and with intravenous contrast. CONTRAST:  66mL GADAVIST GADOBUTROL 1 MMOL/ML IV SOLN COMPARISON:  PET-CT 01/09/2021. Head CT 06/08/2018. FINDINGS: Brain: No restricted diffusion to suggest acute infarction. No midline shift, mass effect, evidence of mass lesion, ventriculomegaly, extra-axial collection or acute intracranial hemorrhage. Cervicomedullary junction and pituitary are within normal limits. **Note De-Identified Novak Obfuscation** Moderate to advanced Patchy and confluent bilateral cerebral white matter T2 and FLAIR hyperintensity, including some involvement of the deep white matter capsules. Superimposed chronic lacunar infarcts in the deep gray matter nuclei, most notably the right thalamus. Mild to moderate patchy signal heterogeneity in the pons. No cortical encephalomalacia. Possible tiny chronic microhemorrhage in the right cerebellum on series 10, image 18, negative cerebellum otherwise. No abnormal enhancement identified. No dural thickening. Vascular: Major intracranial vascular flow voids are preserved. The major dural venous sinuses are enhancing and appear to be patent. Skull and upper cervical spine: Within normal limits for age. Sinuses/Orbits: Postoperative changes to both globes, otherwise negative orbits. Trace paranasal sinus mucosal thickening. Other: Trace mastoid air cell fluid. Minimal retained secretions in the nasopharynx which otherwise appears negative. Grossly normal visible internal auditory structures. Negative visible orbits and scalp. IMPRESSION: 1. No metastatic disease or acute intracranial abnormality identified. 2. Fairly advanced signal changes of chronic small vessel disease. Electronically Signed   By: Genevie Ann M.D.   On: 02/08/2021 06:03     ASSESSMENT/PLAN:  This is a very  pleasant 65 year old Caucasian female recently diagnosed with a stage IIb (T1c, N1, M0) non-small cell lung cancer, squamous cell carcinoma presented with left upper lobe lung nodule in addition to left hilar adenopathy diagnosed in April 2022.  The patient is not a good surgical candidate for resection because of her COPD, interstitial lung disease as well as the other comorbidities and being on home oxygen. Therefore, Dr. Julien Nordmann recommended a course of concurrent chemoradiation with carboplatin for an AUC of 2 and paclitaxel 45 mg per metered squared. She is status post 1 cycle and tolerated it well. Her last day of radiation is scheduled for 04/07/21   She is scheduled to start radiation on 02/20/2021. The patient has extensive psoriasis and she will not be a great candidate for any consolidation immunotherapy in the future.   Labs were reviewed. Recommend that she proceed with cycle #2 today as scheduled.   We will see her back for a follow up visit in 2 weeks for evaluation before starting cycle #4.   The patient was advised to call immediately if she has any concerning symptoms in the interval. The patient voices understanding of current disease status and treatment options and is in agreement with the current care plan. All questions were answered. The patient knows to call the clinic with any problems, questions or concerns. We can certainly see the patient much sooner if necessary       No orders of the defined types were placed in this encounter.    I spent 20-29 minutes in this encounter.   Geordan Xu L Toretto Tingler, PA-C 02/28/21

## 2021-02-23 NOTE — Progress Notes (Signed)
**Note De-Identified Spangler Obfuscation** Pt here for patient teaching.  Pt given Radiation and You booklet, skin care instructions and Sonafine.  Reviewed areas of pertinence such as fatigue, hair loss, skin changes, throat changes, cough and shortness of breath . Pt able to give teach back of to pat skin and use unscented/gentle soap,apply Sonafine bid and avoid applying anything to skin within 4 hours of treatment. Pt verbalizes understanding of information given and will contact nursing with any questions or concerns.     [email or web address]  Hailey Hernandez. Leonie Green, BSN

## 2021-02-24 ENCOUNTER — Other Ambulatory Visit: Payer: Self-pay

## 2021-02-24 ENCOUNTER — Ambulatory Visit
Admission: RE | Admit: 2021-02-24 | Discharge: 2021-02-24 | Disposition: A | Discharge status: Home / Self Care | Payer: Medicare Other | Source: Ambulatory Visit | Attending: Radiation Oncology | Admitting: Radiation Oncology

## 2021-02-24 DIAGNOSIS — C3412 Malignant neoplasm of upper lobe, left bronchus or lung: Secondary | ICD-10-CM

## 2021-02-24 DIAGNOSIS — Z510 Encounter for antineoplastic radiation therapy: Secondary | ICD-10-CM | POA: Diagnosis not present

## 2021-02-24 MED ORDER — SONAFINE EX EMUL
1.0000 "application " | Freq: Once | CUTANEOUS | Status: AC
  Administered 2021-02-24: 1 via TOPICAL

## 2021-02-28 ENCOUNTER — Encounter: Payer: Self-pay | Admitting: Physician Assistant

## 2021-02-28 ENCOUNTER — Inpatient Hospital Stay: Payer: Medicare Other | Admitting: Physician Assistant

## 2021-02-28 ENCOUNTER — Ambulatory Visit
Admission: RE | Admit: 2021-02-28 | Discharge: 2021-02-28 | Disposition: A | Discharge status: Home / Self Care | Payer: Medicare Other | Source: Ambulatory Visit | Attending: Radiation Oncology | Admitting: Radiation Oncology

## 2021-02-28 ENCOUNTER — Inpatient Hospital Stay: Payer: Medicare Other

## 2021-02-28 ENCOUNTER — Other Ambulatory Visit: Payer: Self-pay

## 2021-02-28 VITALS — BP 135/67 | HR 93 | Temp 98.3°F | Resp 20 | Ht 65.0 in | Wt 214.4 lb

## 2021-02-28 DIAGNOSIS — Z510 Encounter for antineoplastic radiation therapy: Secondary | ICD-10-CM | POA: Diagnosis not present

## 2021-02-28 DIAGNOSIS — J449 Chronic obstructive pulmonary disease, unspecified: Secondary | ICD-10-CM | POA: Diagnosis not present

## 2021-02-28 DIAGNOSIS — R0609 Other forms of dyspnea: Secondary | ICD-10-CM | POA: Diagnosis not present

## 2021-02-28 DIAGNOSIS — I10 Essential (primary) hypertension: Secondary | ICD-10-CM | POA: Diagnosis not present

## 2021-02-28 DIAGNOSIS — C3412 Malignant neoplasm of upper lobe, left bronchus or lung: Secondary | ICD-10-CM

## 2021-02-28 DIAGNOSIS — R0602 Shortness of breath: Secondary | ICD-10-CM | POA: Diagnosis not present

## 2021-02-28 DIAGNOSIS — Z79899 Other long term (current) drug therapy: Secondary | ICD-10-CM | POA: Diagnosis not present

## 2021-02-28 DIAGNOSIS — R5383 Other fatigue: Secondary | ICD-10-CM | POA: Diagnosis not present

## 2021-02-28 DIAGNOSIS — Z9981 Dependence on supplemental oxygen: Secondary | ICD-10-CM | POA: Diagnosis not present

## 2021-02-28 DIAGNOSIS — R059 Cough, unspecified: Secondary | ICD-10-CM | POA: Diagnosis not present

## 2021-02-28 DIAGNOSIS — Z5111 Encounter for antineoplastic chemotherapy: Secondary | ICD-10-CM | POA: Diagnosis not present

## 2021-02-28 DIAGNOSIS — R0789 Other chest pain: Secondary | ICD-10-CM | POA: Diagnosis not present

## 2021-02-28 DIAGNOSIS — R519 Headache, unspecified: Secondary | ICD-10-CM | POA: Diagnosis not present

## 2021-02-28 LAB — CBC WITH DIFFERENTIAL (CANCER CENTER ONLY)
Abs Immature Granulocytes: 0.01 10*3/uL (ref 0.00–0.07)
Basophils Absolute: 0 10*3/uL (ref 0.0–0.1)
Basophils Relative: 1 %
Eosinophils Absolute: 0.2 10*3/uL (ref 0.0–0.5)
Eosinophils Relative: 5 %
HCT: 36.3 % (ref 36.0–46.0)
Hemoglobin: 11 g/dL — ABNORMAL LOW (ref 12.0–15.0)
Immature Granulocytes: 0 %
Lymphocytes Relative: 23 %
Lymphs Abs: 1 10*3/uL (ref 0.7–4.0)
MCH: 30.4 pg (ref 26.0–34.0)
MCHC: 30.3 g/dL (ref 30.0–36.0)
MCV: 100.3 fL — ABNORMAL HIGH (ref 80.0–100.0)
Monocytes Absolute: 0.4 10*3/uL (ref 0.1–1.0)
Monocytes Relative: 9 %
Neutro Abs: 2.7 10*3/uL (ref 1.7–7.7)
Neutrophils Relative %: 62 %
Platelet Count: 95 10*3/uL — ABNORMAL LOW (ref 150–400)
RBC: 3.62 MIL/uL — ABNORMAL LOW (ref 3.87–5.11)
RDW: 15.2 % (ref 11.5–15.5)
WBC Count: 4.3 10*3/uL (ref 4.0–10.5)
nRBC: 0 % (ref 0.0–0.2)

## 2021-02-28 LAB — CMP (CANCER CENTER ONLY)
ALT: 18 U/L (ref 0–44)
AST: 27 U/L (ref 15–41)
Albumin: 3 g/dL — ABNORMAL LOW (ref 3.5–5.0)
Alkaline Phosphatase: 170 U/L — ABNORMAL HIGH (ref 38–126)
Anion gap: 10 (ref 5–15)
BUN: 22 mg/dL (ref 8–23)
CO2: 31 mmol/L (ref 22–32)
Calcium: 9.2 mg/dL (ref 8.9–10.3)
Chloride: 102 mmol/L (ref 98–111)
Creatinine: 0.84 mg/dL (ref 0.44–1.00)
GFR, Estimated: >60 mL/min (ref >60)
Glucose, Bld: 105 mg/dL — ABNORMAL HIGH (ref 70–99)
Potassium: 5 mmol/L (ref 3.5–5.1)
Sodium: 143 mmol/L (ref 135–145)
Total Bilirubin: 0.5 mg/dL (ref 0.3–1.2)
Total Protein: 7.3 g/dL (ref 6.5–8.1)

## 2021-02-28 MED ORDER — DIPHENHYDRAMINE HCL 50 MG/ML IJ SOLN
50.0000 mg | Freq: Once | INTRAMUSCULAR | Status: AC
  Administered 2021-02-28: 50 mg via INTRAVENOUS

## 2021-02-28 MED ORDER — SODIUM CHLORIDE 0.9 % IV SOLN
173.6000 mg | Freq: Once | INTRAVENOUS | Status: AC
  Administered 2021-02-28: 170 mg via INTRAVENOUS
  Filled 2021-02-28: qty 17

## 2021-02-28 MED ORDER — PACLITAXEL CHEMO INJECTION 300 MG/50ML
45.0000 mg/m2 | Freq: Once | INTRAVENOUS | Status: AC
  Administered 2021-02-28: 96 mg via INTRAVENOUS
  Filled 2021-02-28: qty 16

## 2021-02-28 MED ORDER — SODIUM CHLORIDE 0.9 % IV SOLN
20.0000 mg | Freq: Once | INTRAVENOUS | Status: AC
  Administered 2021-02-28: 20 mg via INTRAVENOUS
  Filled 2021-02-28: qty 2
  Filled 2021-02-28: qty 20

## 2021-02-28 MED ORDER — FAMOTIDINE 20 MG IN NS 100 ML IVPB
INTRAVENOUS | Status: AC
  Filled 2021-02-28: qty 100

## 2021-02-28 MED ORDER — PALONOSETRON HCL INJECTION 0.25 MG/5ML
0.2500 mg | Freq: Once | INTRAVENOUS | Status: AC
  Administered 2021-02-28: 0.25 mg via INTRAVENOUS

## 2021-02-28 MED ORDER — SODIUM CHLORIDE 0.9 % IV SOLN
Freq: Once | INTRAVENOUS | Status: AC
  Filled 2021-02-28: qty 250

## 2021-02-28 MED ORDER — PALONOSETRON HCL INJECTION 0.25 MG/5ML
INTRAVENOUS | Status: AC
  Filled 2021-02-28: qty 5

## 2021-02-28 MED ORDER — FAMOTIDINE 20 MG IN NS 100 ML IVPB
20.0000 mg | Freq: Once | INTRAVENOUS | Status: AC
  Administered 2021-02-28: 20 mg via INTRAVENOUS

## 2021-02-28 MED ORDER — DIPHENHYDRAMINE HCL 50 MG/ML IJ SOLN
INTRAMUSCULAR | Status: AC
  Filled 2021-02-28: qty 1

## 2021-02-28 NOTE — Patient Instructions (Signed)
**Note De-Identified [place]worthy Obfuscation** [facility]  Discharge Instructions: Thank you for choosing [facility] to provide your oncology and hematology care.   If you have a lab appointment with the [facility], please go directly to the [facility] and check in at the registration area.   Wear comfortable clothing and clothing appropriate for easy access to any Portacath or PICC line.   We strive to give you quality time with your provider. You may need to reschedule your appointment if you arrive late (15 or more minutes).  Arriving late affects you and other patients whose appointments are after yours.  Also, if you miss three or more appointments without notifying the office, you may be dismissed from the clinic at the provider's discretion.      For prescription refill requests, have your pharmacy contact our office and allow 72 hours for refills to be completed.    Today you received the following chemotherapy and/or immunotherapy agents paclitaxel, carboplatin      To help prevent nausea and vomiting after your treatment, we encourage you to take your nausea medication as directed.  BELOW ARE SYMPTOMS THAT SHOULD BE REPORTED IMMEDIATELY: . *FEVER GREATER THAN 100.4 F (38 C) OR HIGHER . *CHILLS OR SWEATING . *NAUSEA AND VOMITING THAT IS NOT CONTROLLED WITH YOUR NAUSEA MEDICATION . *UNUSUAL SHORTNESS OF BREATH . *UNUSUAL BRUISING OR BLEEDING . *URINARY PROBLEMS (pain or burning when urinating, or frequent urination) . *BOWEL PROBLEMS (unusual diarrhea, constipation, pain near the anus) . TENDERNESS IN MOUTH AND THROAT WITH OR WITHOUT PRESENCE OF ULCERS (sore throat, sores in mouth, or a toothache) . UNUSUAL RASH, SWELLING OR PAIN  . UNUSUAL VAGINAL DISCHARGE OR ITCHING   Items with * indicate a potential emergency and should be followed up as soon as possible or go to the Emergency Department if any problems should occur.  Please show the CHEMOTHERAPY ALERT CARD or  IMMUNOTHERAPY ALERT CARD at check-in to the Emergency Department and triage nurse.  Should you have questions after your visit or need to cancel or reschedule your appointment, please contact [facility]  Dept: [phone]  and follow the prompts.  Office hours are 8:00 a.m. to 4:30 p.m. Monday - Friday. Please note that voicemails left after 4:00 p.m. may not be returned until the following business day.  We are closed weekends and major holidays. You have access to a nurse at all times for urgent questions. Please call the main number to the clinic Dept: [phone] and follow the prompts.   For any non-urgent questions, you may also contact your provider using MyChart. We now offer e-Visits for anyone 23 and older to request care online for non-urgent symptoms. For details visit [email or web address].   Also download the MyChart app! Go to the app store, search "MyChart", open the app, select [facility], and log in with your MyChart username and password.  Due to Covid, a mask is required upon entering the hospital/clinic. If you do not have a mask, one will be given to you upon arrival. For doctor visits, patients may have 1 support person aged 32 or older with them. For treatment visits, patients cannot have anyone with them due to current Covid guidelines and our immunocompromised population.

## 2021-02-28 NOTE — Progress Notes (Signed)
**Note De-Identified Folds Obfuscation** Ok to treat with platelets of 95 today per provider

## 2021-03-01 ENCOUNTER — Other Ambulatory Visit: Payer: Self-pay | Admitting: Family Medicine

## 2021-03-01 ENCOUNTER — Ambulatory Visit
Admission: RE | Admit: 2021-03-01 | Discharge: 2021-03-01 | Disposition: A | Discharge status: Home / Self Care | Payer: Medicare Other | Source: Ambulatory Visit | Attending: Radiation Oncology | Admitting: Radiation Oncology

## 2021-03-01 DIAGNOSIS — C3412 Malignant neoplasm of upper lobe, left bronchus or lung: Secondary | ICD-10-CM | POA: Diagnosis not present

## 2021-03-01 DIAGNOSIS — Z510 Encounter for antineoplastic radiation therapy: Secondary | ICD-10-CM | POA: Insufficient documentation

## 2021-03-02 ENCOUNTER — Other Ambulatory Visit: Payer: Self-pay

## 2021-03-02 ENCOUNTER — Ambulatory Visit
Admission: RE | Admit: 2021-03-02 | Discharge: 2021-03-02 | Disposition: A | Discharge status: Home / Self Care | Payer: Medicare Other | Source: Ambulatory Visit | Attending: Radiation Oncology | Admitting: Radiation Oncology

## 2021-03-02 DIAGNOSIS — Z510 Encounter for antineoplastic radiation therapy: Secondary | ICD-10-CM | POA: Diagnosis not present

## 2021-03-02 DIAGNOSIS — C3412 Malignant neoplasm of upper lobe, left bronchus or lung: Secondary | ICD-10-CM | POA: Diagnosis not present

## 2021-03-03 ENCOUNTER — Ambulatory Visit
Admission: RE | Admit: 2021-03-03 | Discharge: 2021-03-03 | Disposition: A | Discharge status: Home / Self Care | Payer: Medicare Other | Source: Ambulatory Visit | Attending: Radiation Oncology | Admitting: Radiation Oncology

## 2021-03-03 DIAGNOSIS — C3412 Malignant neoplasm of upper lobe, left bronchus or lung: Secondary | ICD-10-CM | POA: Diagnosis not present

## 2021-03-03 DIAGNOSIS — Z510 Encounter for antineoplastic radiation therapy: Secondary | ICD-10-CM | POA: Diagnosis not present

## 2021-03-06 ENCOUNTER — Ambulatory Visit
Admission: RE | Admit: 2021-03-06 | Discharge: 2021-03-06 | Disposition: A | Discharge status: Home / Self Care | Payer: Medicare Other | Source: Ambulatory Visit | Attending: Radiation Oncology | Admitting: Radiation Oncology

## 2021-03-06 ENCOUNTER — Inpatient Hospital Stay: Payer: Medicare Other

## 2021-03-06 ENCOUNTER — Other Ambulatory Visit: Payer: Self-pay

## 2021-03-06 ENCOUNTER — Inpatient Hospital Stay: Payer: Medicare Other | Attending: Internal Medicine

## 2021-03-06 VITALS — BP 125/58 | HR 88 | Temp 98.4°F | Resp 18 | Wt 220.8 lb

## 2021-03-06 DIAGNOSIS — Z885 Allergy status to narcotic agent status: Secondary | ICD-10-CM | POA: Diagnosis not present

## 2021-03-06 DIAGNOSIS — Z79899 Other long term (current) drug therapy: Secondary | ICD-10-CM | POA: Diagnosis not present

## 2021-03-06 DIAGNOSIS — C3412 Malignant neoplasm of upper lobe, left bronchus or lung: Secondary | ICD-10-CM | POA: Diagnosis not present

## 2021-03-06 DIAGNOSIS — Z881 Allergy status to other antibiotic agents status: Secondary | ICD-10-CM | POA: Diagnosis not present

## 2021-03-06 DIAGNOSIS — I10 Essential (primary) hypertension: Secondary | ICD-10-CM | POA: Insufficient documentation

## 2021-03-06 DIAGNOSIS — G4733 Obstructive sleep apnea (adult) (pediatric): Secondary | ICD-10-CM | POA: Insufficient documentation

## 2021-03-06 DIAGNOSIS — R059 Cough, unspecified: Secondary | ICD-10-CM | POA: Diagnosis not present

## 2021-03-06 DIAGNOSIS — Z9049 Acquired absence of other specified parts of digestive tract: Secondary | ICD-10-CM | POA: Insufficient documentation

## 2021-03-06 DIAGNOSIS — Z888 Allergy status to other drugs, medicaments and biological substances status: Secondary | ICD-10-CM | POA: Diagnosis not present

## 2021-03-06 DIAGNOSIS — F419 Anxiety disorder, unspecified: Secondary | ICD-10-CM | POA: Insufficient documentation

## 2021-03-06 DIAGNOSIS — J449 Chronic obstructive pulmonary disease, unspecified: Secondary | ICD-10-CM | POA: Diagnosis not present

## 2021-03-06 DIAGNOSIS — Z85850 Personal history of malignant neoplasm of thyroid: Secondary | ICD-10-CM | POA: Diagnosis not present

## 2021-03-06 DIAGNOSIS — R197 Diarrhea, unspecified: Secondary | ICD-10-CM | POA: Insufficient documentation

## 2021-03-06 DIAGNOSIS — R5383 Other fatigue: Secondary | ICD-10-CM | POA: Insufficient documentation

## 2021-03-06 DIAGNOSIS — R590 Localized enlarged lymph nodes: Secondary | ICD-10-CM | POA: Insufficient documentation

## 2021-03-06 DIAGNOSIS — R110 Nausea: Secondary | ICD-10-CM | POA: Diagnosis not present

## 2021-03-06 DIAGNOSIS — Z5111 Encounter for antineoplastic chemotherapy: Secondary | ICD-10-CM | POA: Diagnosis not present

## 2021-03-06 DIAGNOSIS — R0609 Other forms of dyspnea: Secondary | ICD-10-CM | POA: Insufficient documentation

## 2021-03-06 DIAGNOSIS — Z923 Personal history of irradiation: Secondary | ICD-10-CM | POA: Diagnosis not present

## 2021-03-06 DIAGNOSIS — Z510 Encounter for antineoplastic radiation therapy: Secondary | ICD-10-CM | POA: Diagnosis not present

## 2021-03-06 LAB — CMP (CANCER CENTER ONLY)
ALT: 23 U/L (ref 0–44)
AST: 29 U/L (ref 15–41)
Albumin: 3.1 g/dL — ABNORMAL LOW (ref 3.5–5.0)
Alkaline Phosphatase: 155 U/L — ABNORMAL HIGH (ref 38–126)
Anion gap: 10 (ref 5–15)
BUN: 17 mg/dL (ref 8–23)
CO2: 30 mmol/L (ref 22–32)
Calcium: 8.8 mg/dL — ABNORMAL LOW (ref 8.9–10.3)
Chloride: 101 mmol/L (ref 98–111)
Creatinine: 0.86 mg/dL (ref 0.44–1.00)
GFR, Estimated: >60 mL/min (ref >60)
Glucose, Bld: 99 mg/dL (ref 70–99)
Potassium: 4.9 mmol/L (ref 3.5–5.1)
Sodium: 141 mmol/L (ref 135–145)
Total Bilirubin: 0.6 mg/dL (ref 0.3–1.2)
Total Protein: 7.2 g/dL (ref 6.5–8.1)

## 2021-03-06 LAB — CBC WITH DIFFERENTIAL (CANCER CENTER ONLY)
Abs Immature Granulocytes: 0.01 10*3/uL (ref 0.00–0.07)
Basophils Absolute: 0 10*3/uL (ref 0.0–0.1)
Basophils Relative: 0 %
Eosinophils Absolute: 0.1 10*3/uL (ref 0.0–0.5)
Eosinophils Relative: 3 %
HCT: 36.3 % (ref 36.0–46.0)
Hemoglobin: 11 g/dL — ABNORMAL LOW (ref 12.0–15.0)
Immature Granulocytes: 0 %
Lymphocytes Relative: 19 %
Lymphs Abs: 0.9 10*3/uL (ref 0.7–4.0)
MCH: 31.1 pg (ref 26.0–34.0)
MCHC: 30.3 g/dL (ref 30.0–36.0)
MCV: 102.5 fL — ABNORMAL HIGH (ref 80.0–100.0)
Monocytes Absolute: 0.3 10*3/uL (ref 0.1–1.0)
Monocytes Relative: 7 %
Neutro Abs: 3.3 10*3/uL (ref 1.7–7.7)
Neutrophils Relative %: 71 %
Platelet Count: 96 10*3/uL — ABNORMAL LOW (ref 150–400)
RBC: 3.54 MIL/uL — ABNORMAL LOW (ref 3.87–5.11)
RDW: 15.5 % (ref 11.5–15.5)
WBC Count: 4.6 10*3/uL (ref 4.0–10.5)
nRBC: 0 % (ref 0.0–0.2)

## 2021-03-06 MED ORDER — PALONOSETRON HCL INJECTION 0.25 MG/5ML
0.2500 mg | Freq: Once | INTRAVENOUS | Status: AC
  Administered 2021-03-06: 0.25 mg via INTRAVENOUS

## 2021-03-06 MED ORDER — SODIUM CHLORIDE 0.9 % IV SOLN
Freq: Once | INTRAVENOUS | Status: AC
  Filled 2021-03-06: qty 250

## 2021-03-06 MED ORDER — SODIUM CHLORIDE 0.9 % IV SOLN
20.0000 mg | Freq: Once | INTRAVENOUS | Status: AC
  Administered 2021-03-06: 20 mg via INTRAVENOUS
  Filled 2021-03-06: qty 20

## 2021-03-06 MED ORDER — PACLITAXEL CHEMO INJECTION 300 MG/50ML
45.0000 mg/m2 | Freq: Once | INTRAVENOUS | Status: AC
  Administered 2021-03-06: 96 mg via INTRAVENOUS
  Filled 2021-03-06: qty 16

## 2021-03-06 MED ORDER — DIPHENHYDRAMINE HCL 50 MG/ML IJ SOLN
50.0000 mg | Freq: Once | INTRAMUSCULAR | Status: AC
  Administered 2021-03-06: 50 mg via INTRAVENOUS

## 2021-03-06 MED ORDER — FAMOTIDINE 20 MG IN NS 100 ML IVPB
20.0000 mg | Freq: Once | INTRAVENOUS | Status: AC
  Administered 2021-03-06: 20 mg via INTRAVENOUS

## 2021-03-06 MED ORDER — PALONOSETRON HCL INJECTION 0.25 MG/5ML
INTRAVENOUS | Status: AC
  Filled 2021-03-06: qty 5

## 2021-03-06 MED ORDER — SODIUM CHLORIDE 0.9 % IV SOLN
173.6000 mg | Freq: Once | INTRAVENOUS | Status: AC
  Administered 2021-03-06: 170 mg via INTRAVENOUS
  Filled 2021-03-06: qty 17

## 2021-03-06 MED ORDER — SODIUM CHLORIDE 0.9% FLUSH
10.0000 mL | INTRAVENOUS | Status: DC | PRN
  Filled 2021-03-06: qty 10

## 2021-03-06 MED ORDER — HEPARIN SOD (PORK) LOCK FLUSH 100 UNIT/ML IV SOLN
500.0000 [IU] | Freq: Once | INTRAVENOUS | Status: DC | PRN
  Filled 2021-03-06: qty 5

## 2021-03-06 MED ORDER — FAMOTIDINE 20 MG IN NS 100 ML IVPB
INTRAVENOUS | Status: AC
  Filled 2021-03-06: qty 100

## 2021-03-06 MED ORDER — DIPHENHYDRAMINE HCL 50 MG/ML IJ SOLN
INTRAMUSCULAR | Status: AC
  Filled 2021-03-06: qty 1

## 2021-03-06 NOTE — Patient Instructions (Signed)
**Note De-Identified [place] Obfuscation** [facility]  Discharge Instructions: Thank you for choosing [facility] to provide your oncology and hematology care.   If you have a lab appointment with the [facility], please go directly to the [facility] and check in at the registration area.   Wear comfortable clothing and clothing appropriate for easy access to any Portacath or PICC line.   We strive to give you quality time with your provider. You may need to reschedule your appointment if you arrive late (15 or more minutes).  Arriving late affects you and other patients whose appointments are after yours.  Also, if you miss three or more appointments without notifying the office, you may be dismissed from the clinic at the provider's discretion.      For prescription refill requests, have your pharmacy contact our office and allow 72 hours for refills to be completed.    Today you received the following chemotherapy and/or immunotherapy agents paclitaxel, carboplatin      To help prevent nausea and vomiting after your treatment, we encourage you to take your nausea medication as directed.  BELOW ARE SYMPTOMS THAT SHOULD BE REPORTED IMMEDIATELY: . *FEVER GREATER THAN 100.4 F (38 C) OR HIGHER . *CHILLS OR SWEATING . *NAUSEA AND VOMITING THAT IS NOT CONTROLLED WITH YOUR NAUSEA MEDICATION . *UNUSUAL SHORTNESS OF BREATH . *UNUSUAL BRUISING OR BLEEDING . *URINARY PROBLEMS (pain or burning when urinating, or frequent urination) . *BOWEL PROBLEMS (unusual diarrhea, constipation, pain near the anus) . TENDERNESS IN MOUTH AND THROAT WITH OR WITHOUT PRESENCE OF ULCERS (sore throat, sores in mouth, or a toothache) . UNUSUAL RASH, SWELLING OR PAIN  . UNUSUAL VAGINAL DISCHARGE OR ITCHING   Items with * indicate a potential emergency and should be followed up as soon as possible or go to the Emergency Department if any problems should occur.  Please show the CHEMOTHERAPY ALERT CARD or  IMMUNOTHERAPY ALERT CARD at check-in to the Emergency Department and triage nurse.  Should you have questions after your visit or need to cancel or reschedule your appointment, please contact [facility]  Dept: [phone]  and follow the prompts.  Office hours are 8:00 a.m. to 4:30 p.m. Monday - Friday. Please note that voicemails left after 4:00 p.m. may not be returned until the following business day.  We are closed weekends and major holidays. You have access to a nurse at all times for urgent questions. Please call the main number to the clinic Dept: [phone] and follow the prompts.   For any non-urgent questions, you may also contact your provider using MyChart. We now offer e-Visits for anyone 49 and older to request care online for non-urgent symptoms. For details visit [email or web address].   Also download the MyChart app! Go to the app store, search "MyChart", open the app, select [facility], and log in with your MyChart username and password.  Due to Covid, a mask is required upon entering the hospital/clinic. If you do not have a mask, one will be given to you upon arrival. For doctor visits, patients may have 1 support person aged 4 or older with them. For treatment visits, patients cannot have anyone with them due to current Covid guidelines and our immunocompromised population.

## 2021-03-06 NOTE — Progress Notes (Signed)
**Note De-Identified Smolen Obfuscation** Per [name] Heilingoetter, PA, ok to treat with Plt 96

## 2021-03-07 ENCOUNTER — Ambulatory Visit
Admission: RE | Admit: 2021-03-07 | Discharge: 2021-03-07 | Disposition: A | Discharge status: Home / Self Care | Payer: Medicare Other | Source: Ambulatory Visit | Attending: Radiation Oncology | Admitting: Radiation Oncology

## 2021-03-07 DIAGNOSIS — Z510 Encounter for antineoplastic radiation therapy: Secondary | ICD-10-CM | POA: Diagnosis not present

## 2021-03-07 DIAGNOSIS — C3412 Malignant neoplasm of upper lobe, left bronchus or lung: Secondary | ICD-10-CM | POA: Diagnosis not present

## 2021-03-08 ENCOUNTER — Ambulatory Visit
Admission: RE | Admit: 2021-03-08 | Discharge: 2021-03-08 | Disposition: A | Discharge status: Home / Self Care | Payer: Medicare Other | Source: Ambulatory Visit | Attending: Radiation Oncology | Admitting: Radiation Oncology

## 2021-03-08 ENCOUNTER — Other Ambulatory Visit: Payer: Self-pay

## 2021-03-08 DIAGNOSIS — Z510 Encounter for antineoplastic radiation therapy: Secondary | ICD-10-CM | POA: Diagnosis not present

## 2021-03-08 DIAGNOSIS — C3412 Malignant neoplasm of upper lobe, left bronchus or lung: Secondary | ICD-10-CM | POA: Diagnosis not present

## 2021-03-08 DIAGNOSIS — G4733 Obstructive sleep apnea (adult) (pediatric): Secondary | ICD-10-CM | POA: Diagnosis not present

## 2021-03-09 ENCOUNTER — Ambulatory Visit
Admission: RE | Admit: 2021-03-09 | Discharge: 2021-03-09 | Disposition: A | Discharge status: Home / Self Care | Payer: Medicare Other | Source: Ambulatory Visit | Attending: Radiation Oncology | Admitting: Radiation Oncology

## 2021-03-09 DIAGNOSIS — Z510 Encounter for antineoplastic radiation therapy: Secondary | ICD-10-CM | POA: Diagnosis not present

## 2021-03-09 DIAGNOSIS — C3412 Malignant neoplasm of upper lobe, left bronchus or lung: Secondary | ICD-10-CM | POA: Diagnosis not present

## 2021-03-10 ENCOUNTER — Other Ambulatory Visit: Payer: Self-pay

## 2021-03-10 ENCOUNTER — Ambulatory Visit
Admission: RE | Admit: 2021-03-10 | Discharge: 2021-03-10 | Disposition: A | Discharge status: Home / Self Care | Payer: Medicare Other | Source: Ambulatory Visit | Attending: Radiation Oncology | Admitting: Radiation Oncology

## 2021-03-10 ENCOUNTER — Other Ambulatory Visit: Payer: Self-pay | Admitting: Radiation Oncology

## 2021-03-10 DIAGNOSIS — Z510 Encounter for antineoplastic radiation therapy: Secondary | ICD-10-CM | POA: Diagnosis not present

## 2021-03-10 DIAGNOSIS — C3412 Malignant neoplasm of upper lobe, left bronchus or lung: Secondary | ICD-10-CM | POA: Diagnosis not present

## 2021-03-10 MED ORDER — SUCRALFATE 1 G PO TABS: 1.0000 g | ORAL_TABLET | Freq: Four times a day (QID) | ORAL | 2 refills | Status: DC

## 2021-03-11 DIAGNOSIS — G4733 Obstructive sleep apnea (adult) (pediatric): Secondary | ICD-10-CM | POA: Diagnosis not present

## 2021-03-11 DIAGNOSIS — T85511A Breakdown (mechanical) of esophageal anti-reflux device, initial encounter: Secondary | ICD-10-CM | POA: Diagnosis not present

## 2021-03-11 DIAGNOSIS — L400 Psoriasis vulgaris: Secondary | ICD-10-CM | POA: Diagnosis not present

## 2021-03-11 DIAGNOSIS — J449 Chronic obstructive pulmonary disease, unspecified: Secondary | ICD-10-CM | POA: Diagnosis not present

## 2021-03-13 ENCOUNTER — Encounter: Payer: Self-pay | Admitting: Internal Medicine

## 2021-03-13 ENCOUNTER — Inpatient Hospital Stay: Payer: Medicare Other | Admitting: Internal Medicine

## 2021-03-13 ENCOUNTER — Inpatient Hospital Stay: Payer: Medicare Other

## 2021-03-13 ENCOUNTER — Ambulatory Visit
Admission: RE | Admit: 2021-03-13 | Discharge: 2021-03-13 | Disposition: A | Discharge status: Home / Self Care | Payer: Medicare Other | Source: Ambulatory Visit | Attending: Radiation Oncology | Admitting: Radiation Oncology

## 2021-03-13 ENCOUNTER — Other Ambulatory Visit: Payer: Self-pay

## 2021-03-13 ENCOUNTER — Encounter: Payer: Self-pay | Admitting: *Deleted

## 2021-03-13 VITALS — BP 132/53 | HR 79

## 2021-03-13 VITALS — BP 113/45 | HR 89 | Temp 97.6°F | Resp 20 | Ht 65.0 in | Wt 209.9 lb

## 2021-03-13 DIAGNOSIS — C3412 Malignant neoplasm of upper lobe, left bronchus or lung: Secondary | ICD-10-CM

## 2021-03-13 DIAGNOSIS — R059 Cough, unspecified: Secondary | ICD-10-CM | POA: Diagnosis not present

## 2021-03-13 DIAGNOSIS — J449 Chronic obstructive pulmonary disease, unspecified: Secondary | ICD-10-CM | POA: Diagnosis not present

## 2021-03-13 DIAGNOSIS — I10 Essential (primary) hypertension: Secondary | ICD-10-CM

## 2021-03-13 DIAGNOSIS — Z5111 Encounter for antineoplastic chemotherapy: Secondary | ICD-10-CM

## 2021-03-13 DIAGNOSIS — R590 Localized enlarged lymph nodes: Secondary | ICD-10-CM | POA: Diagnosis not present

## 2021-03-13 DIAGNOSIS — R5383 Other fatigue: Secondary | ICD-10-CM | POA: Diagnosis not present

## 2021-03-13 DIAGNOSIS — R0609 Other forms of dyspnea: Secondary | ICD-10-CM | POA: Diagnosis not present

## 2021-03-13 DIAGNOSIS — Z85850 Personal history of malignant neoplasm of thyroid: Secondary | ICD-10-CM | POA: Diagnosis not present

## 2021-03-13 DIAGNOSIS — R197 Diarrhea, unspecified: Secondary | ICD-10-CM | POA: Diagnosis not present

## 2021-03-13 DIAGNOSIS — Z79899 Other long term (current) drug therapy: Secondary | ICD-10-CM | POA: Diagnosis not present

## 2021-03-13 DIAGNOSIS — Z9049 Acquired absence of other specified parts of digestive tract: Secondary | ICD-10-CM | POA: Diagnosis not present

## 2021-03-13 DIAGNOSIS — R110 Nausea: Secondary | ICD-10-CM | POA: Diagnosis not present

## 2021-03-13 DIAGNOSIS — G4733 Obstructive sleep apnea (adult) (pediatric): Secondary | ICD-10-CM | POA: Diagnosis not present

## 2021-03-13 DIAGNOSIS — Z510 Encounter for antineoplastic radiation therapy: Secondary | ICD-10-CM | POA: Diagnosis not present

## 2021-03-13 DIAGNOSIS — Z888 Allergy status to other drugs, medicaments and biological substances status: Secondary | ICD-10-CM | POA: Diagnosis not present

## 2021-03-13 DIAGNOSIS — Z881 Allergy status to other antibiotic agents status: Secondary | ICD-10-CM | POA: Diagnosis not present

## 2021-03-13 DIAGNOSIS — Z923 Personal history of irradiation: Secondary | ICD-10-CM | POA: Diagnosis not present

## 2021-03-13 DIAGNOSIS — Z885 Allergy status to narcotic agent status: Secondary | ICD-10-CM | POA: Diagnosis not present

## 2021-03-13 LAB — CBC WITH DIFFERENTIAL (CANCER CENTER ONLY)
Abs Immature Granulocytes: 0.02 10*3/uL (ref 0.00–0.07)
Basophils Absolute: 0 10*3/uL (ref 0.0–0.1)
Basophils Relative: 0 %
Eosinophils Absolute: 0.1 10*3/uL (ref 0.0–0.5)
Eosinophils Relative: 2 %
HCT: 33.8 % — ABNORMAL LOW (ref 36.0–46.0)
Hemoglobin: 10.4 g/dL — ABNORMAL LOW (ref 12.0–15.0)
Immature Granulocytes: 1 %
Lymphocytes Relative: 19 %
Lymphs Abs: 0.7 10*3/uL (ref 0.7–4.0)
MCH: 31.4 pg (ref 26.0–34.0)
MCHC: 30.8 g/dL (ref 30.0–36.0)
MCV: 102.1 fL — ABNORMAL HIGH (ref 80.0–100.0)
Monocytes Absolute: 0.4 10*3/uL (ref 0.1–1.0)
Monocytes Relative: 12 %
Neutro Abs: 2.4 10*3/uL (ref 1.7–7.7)
Neutrophils Relative %: 66 %
Platelet Count: 121 10*3/uL — ABNORMAL LOW (ref 150–400)
RBC: 3.31 MIL/uL — ABNORMAL LOW (ref 3.87–5.11)
RDW: 16.5 % — ABNORMAL HIGH (ref 11.5–15.5)
WBC Count: 3.7 10*3/uL — ABNORMAL LOW (ref 4.0–10.5)
nRBC: 0 % (ref 0.0–0.2)

## 2021-03-13 LAB — CMP (CANCER CENTER ONLY)
ALT: 21 U/L (ref 0–44)
AST: 30 U/L (ref 15–41)
Albumin: 3 g/dL — ABNORMAL LOW (ref 3.5–5.0)
Alkaline Phosphatase: 138 U/L — ABNORMAL HIGH (ref 38–126)
Anion gap: 7 (ref 5–15)
BUN: 19 mg/dL (ref 8–23)
CO2: 30 mmol/L (ref 22–32)
Calcium: 8.5 mg/dL — ABNORMAL LOW (ref 8.9–10.3)
Chloride: 102 mmol/L (ref 98–111)
Creatinine: 1.08 mg/dL — ABNORMAL HIGH (ref 0.44–1.00)
GFR, Estimated: 56 mL/min — ABNORMAL LOW (ref >60)
Glucose, Bld: 99 mg/dL (ref 70–99)
Potassium: 4.6 mmol/L (ref 3.5–5.1)
Sodium: 139 mmol/L (ref 135–145)
Total Bilirubin: 0.9 mg/dL (ref 0.3–1.2)
Total Protein: 6.8 g/dL (ref 6.5–8.1)

## 2021-03-13 MED ORDER — PALONOSETRON HCL INJECTION 0.25 MG/5ML
INTRAVENOUS | Status: AC
  Filled 2021-03-13: qty 5

## 2021-03-13 MED ORDER — SODIUM CHLORIDE 0.9 % IV SOLN
45.0000 mg/m2 | Freq: Once | INTRAVENOUS | Status: AC
  Administered 2021-03-13: 96 mg via INTRAVENOUS
  Filled 2021-03-13: qty 16

## 2021-03-13 MED ORDER — SODIUM CHLORIDE 0.9% FLUSH
10.0000 mL | INTRAVENOUS | Status: DC | PRN
  Filled 2021-03-13: qty 10

## 2021-03-13 MED ORDER — HEPARIN SOD (PORK) LOCK FLUSH 100 UNIT/ML IV SOLN
500.0000 [IU] | Freq: Once | INTRAVENOUS | Status: DC | PRN
  Filled 2021-03-13: qty 5

## 2021-03-13 MED ORDER — PALONOSETRON HCL INJECTION 0.25 MG/5ML
0.2500 mg | Freq: Once | INTRAVENOUS | Status: AC
  Administered 2021-03-13: 0.25 mg via INTRAVENOUS

## 2021-03-13 MED ORDER — FAMOTIDINE 20 MG IN NS 100 ML IVPB
20.0000 mg | Freq: Once | INTRAVENOUS | Status: AC
  Administered 2021-03-13: 20 mg via INTRAVENOUS

## 2021-03-13 MED ORDER — DIPHENHYDRAMINE HCL 50 MG/ML IJ SOLN
50.0000 mg | Freq: Once | INTRAMUSCULAR | Status: AC
  Administered 2021-03-13: 50 mg via INTRAVENOUS

## 2021-03-13 MED ORDER — SODIUM CHLORIDE 0.9 % IV SOLN
Freq: Once | INTRAVENOUS | Status: AC
  Filled 2021-03-13: qty 250

## 2021-03-13 MED ORDER — DIPHENHYDRAMINE HCL 50 MG/ML IJ SOLN
INTRAMUSCULAR | Status: AC
  Filled 2021-03-13: qty 1

## 2021-03-13 MED ORDER — SODIUM CHLORIDE 0.9 % IV SOLN
173.6000 mg | Freq: Once | INTRAVENOUS | Status: AC
  Administered 2021-03-13: 170 mg via INTRAVENOUS
  Filled 2021-03-13: qty 17

## 2021-03-13 MED ORDER — FAMOTIDINE 20 MG IN NS 100 ML IVPB
INTRAVENOUS | Status: AC
  Filled 2021-03-13: qty 100

## 2021-03-13 MED ORDER — SODIUM CHLORIDE 0.9 % IV SOLN
20.0000 mg | Freq: Once | INTRAVENOUS | Status: AC
  Administered 2021-03-13: 20 mg via INTRAVENOUS
  Filled 2021-03-13: qty 20

## 2021-03-13 NOTE — Progress Notes (Signed)
**Note De-Identified Mans Obfuscation** I spoke to Mr and Ms Mulhall today.  She is on her 4 cycle and getting treatment today.  She is having some nausea and diarrhea.  Dr. Julien Nordmann addressed these issues with her and I help educate on hydration.  It was great seeing her and her husband today.

## 2021-03-13 NOTE — Patient Instructions (Signed)
**Note De-identified [place] Obfuscation** [facility]  Discharge Instructions: Thank you for choosing [facility] to provide your oncology and hematology care.   If you have a lab appointment with the Cancer Center, please go directly to the Cancer Center and check in at the registration area.   Wear comfortable clothing and clothing appropriate for easy access to any Portacath or PICC line.   We strive to give you quality time with your provider. You may need to reschedule your appointment if you arrive late (15 or more minutes).  Arriving late affects you and other patients whose appointments are after yours.  Also, if you miss three or more appointments without notifying the office, you may be dismissed from the clinic at the provider's discretion.      For prescription refill requests, have your pharmacy contact our office and allow 72 hours for refills to be completed.    Today you received the following chemotherapy and/or immunotherapy agents: Paclitaxel (Taxol) and Carboplatin.   To help prevent nausea and vomiting after your treatment, we encourage you to take your nausea medication as directed.  BELOW ARE SYMPTOMS THAT SHOULD BE REPORTED IMMEDIATELY: *FEVER GREATER THAN 100.4 F (38 C) OR HIGHER *CHILLS OR SWEATING *NAUSEA AND VOMITING THAT IS NOT CONTROLLED WITH YOUR NAUSEA MEDICATION *UNUSUAL SHORTNESS OF BREATH *UNUSUAL BRUISING OR BLEEDING *URINARY PROBLEMS (pain or burning when urinating, or frequent urination) *BOWEL PROBLEMS (unusual diarrhea, constipation, pain near the anus) TENDERNESS IN MOUTH AND THROAT WITH OR WITHOUT PRESENCE OF ULCERS (sore throat, sores in mouth, or a toothache) UNUSUAL RASH, SWELLING OR PAIN  UNUSUAL VAGINAL DISCHARGE OR ITCHING   Items with * indicate a potential emergency and should be followed up as soon as possible or go to the Emergency Department if any problems should occur.  Please show the CHEMOTHERAPY ALERT CARD or IMMUNOTHERAPY  ALERT CARD at check-in to the Emergency Department and triage nurse.  Should you have questions after your visit or need to cancel or reschedule your appointment, please contact [facility]  Dept: [phone]  and follow the prompts.  Office hours are 8:00 a.m. to 4:30 p.m. Monday - Friday. Please note that voicemails left after 4:00 p.m. may not be returned until the following business day.  We are closed weekends and major holidays. You have access to a nurse at all times for urgent questions. Please call the main number to the clinic Dept: [phone] and follow the prompts.   For any non-urgent questions, you may also contact your provider using MyChart. We now offer e-Visits for anyone 18 and older to request care online for non-urgent symptoms. For details visit [email or web address].   Also download the MyChart app! Go to the app store, search "MyChart", open the app, select [facility], and log in with your MyChart username and password.  Due to Covid, a mask is required upon entering the hospital/clinic. If you do not have a mask, one will be given to you upon arrival. For doctor visits, patients may have 1 support person aged 18 or older with them. For treatment visits, patients cannot have anyone with them due to current Covid guidelines and our immunocompromised population.   

## 2021-03-13 NOTE — Progress Notes (Signed)
**Note De-Identified [place] Obfuscation** [place] [phone]   [phone]  OFFICE PROGRESS NOTE  [name], DO [place] [address]  DIAGNOSIS: Stage IIb (T1c, N1, M0) non-small cell lung cancer, squamous cell carcinoma presented with left upper lobe lung nodule in addition to left hilar adenopathy diagnosed in April 2022.   PRIOR THERAPY: None   CURRENT THERAPY: Concurrent chemoradiation with weekly carboplatin for AUC of 2 and paclitaxel 45 Mg/M2 for 6-7 weeks. First dose on 02/21/21. Status post 3 cycles.   INTERVAL HISTORY: Hailey Hernandez 65 y.o. female returns to the clinic today for follow-up visit accompanied by her husband.  The patient is feeling fine today with no concerning complaints except for few episodes of nausea and diarrhea up to 6 times a day.  She takes Imodium as needed.  She denied having any current chest pain but has shortness of breath at baseline increased with exertion and she is currently on home oxygen.  She denied having any fever or chills.  She has no significant weight loss or night sweats.  She continues to tolerate her treatment with concurrent chemoradiation fairly well.  MEDICAL HISTORY: Past Medical History:  Diagnosis Date   Allergy    Anxiety disorder    Arthritis    bil legs   Asthma    Cancer ([place])    Chronic bronchitis    Complication of anesthesia    slow to wake up, one time she turned blue in recovery   COPD (chronic obstructive pulmonary disease) (HCC)    Depression    Diverticulitis    DJD (degenerative joint disease)    spine   Dyspnea    O2 at 3 L 24/7   Fibromyalgia    GERD (gastroesophageal reflux disease)    Glaucoma    History of thyroid cancer    HTN (hypertension)    Hyperlipidemia    Impaired glucose tolerance 05/08/2013   OSA (obstructive sleep apnea)    CPAP  last sleep study 8-10 yr.ag0   Polymyalgia ([place])    Pre-diabetes    Psoriasis    Rhinitis    Tobacco abuse     ALLERGIES:   is allergic to doxycycline, tramadol, milnacipran, and apremilast.  MEDICATIONS:  Current Outpatient Medications  Medication Sig Dispense Refill   albuterol (PROVENTIL) (2.5 MG/3ML) 0.083% nebulizer solution Take 3 mLs (2.5 mg total) by nebulization every 4 (four) hours as needed for wheezing or shortness of breath. DX: 496 180 mL 12   albuterol (VENTOLIN HFA) 108 (90 Base) MCG/ACT inhaler Inhale 2 puffs every 4-6 hors as needed- rescue 18 g 12   alendronate (FOSAMAX) 70 MG tablet Take 1 tablet (70 mg total) by mouth every 7 (seven) days. Take with a full glass of water on an empty stomach. (Patient taking differently: Take 70 mg by mouth every 7 (seven) days. Take with a full glass of water on an empty stomach. Sunday) 4 tablet 11   aspirin EC 81 MG tablet Take 81 mg by mouth daily.     atorvastatin (LIPITOR) 80 MG tablet TAKE 1 TABLET ONCE DAILY 90 tablet 0   Budeson-Glycopyrrol-Formoterol (BREZTRI AEROSPHERE) 160-9-4.8 MCG/ACT AERO Inhale 2 puffs into the lungs 2 (two) times daily. 5.9 g 0   calcium carbonate (CALTRATE 600) 1500 (600 Ca) MG TABS tablet Take 1 tablet (1,500 mg total) by mouth 2 (two) times daily with a meal. 60 tablet 3   colchicine 0.6 MG tablet TAKE **Note De-Identified [place] Obfuscation** 1 TABLET ONCE DAILY 30 tablet 0   FLUoxetine (PROZAC) 20 MG capsule TAKE 1 CAPSULE ONCE DAILY 90 capsule 0   fluticasone (FLONASE) 50 MCG/ACT nasal spray Place 2 sprays into both nostrils daily. 16 g 6   folic acid (FOLVITE) 1 MG tablet Take 1 mg by mouth at bedtime.     lisinopril (ZESTRIL) 20 MG tablet TAKE 1 TABLET BY MOUTH DAILY. 90 tablet 0   montelukast (SINGULAIR) 10 MG tablet TAKE 1 TABLET EVERY DAY (Patient taking differently: Take 10 mg by mouth at bedtime.) 90 tablet 0   Nebulizers (COMPRESSOR/NEBULIZER) MISC Use up to 4 times daily when needed 1 each 0   prochlorperazine (COMPAZINE) 10 MG tablet Take 1 tablet (10 mg total) by mouth every 6 (six) hours as needed for nausea or vomiting. 30 tablet 0   Respiratory Therapy  Supplies (FLUTTER) DEVI Patient to use device for ten minutes, three times daily 1 each 0   sucralfate (CARAFATE) 1 g tablet Take 1 tablet (1 g total) by mouth 4 (four) times daily. Dissolve each tablet in 15 cc water before use. 120 tablet 2   No current facility-administered medications for this visit.    SURGICAL HISTORY:  Past Surgical History:  Procedure Laterality Date   APPENDECTOMY     BRONCHIAL BIOPSY  01/24/2021   Procedure: BRONCHIAL BIOPSIES;  Surgeon: Garner Nash, DO;  Location: [facility];  Service: Pulmonary;;   BRONCHIAL BRUSHINGS  01/24/2021   Procedure: BRONCHIAL BRUSHINGS;  Surgeon: Garner Nash, DO;  Location: [facility];  Service: Pulmonary;;   BRONCHIAL NEEDLE ASPIRATION BIOPSY  01/24/2021   Procedure: BRONCHIAL NEEDLE ASPIRATION BIOPSIES;  Surgeon: Garner Nash, DO;  Location: [facility];  Service: Pulmonary;;   CARPAL TUNNEL RELEASE  10/30/2011   Procedure: CARPAL TUNNEL RELEASE;  Surgeon: Wynonia Sours, MD;  Location: [facility];  Service: Orthopedics;  Laterality: Right;  and mass excision   CHOLECYSTECTOMY     CHOLESTEATOMA EXCISION     right ear   left shoulder spurs     ORIF right lower leg     partial throidectomy     TOTAL ABDOMINAL HYSTERECTOMY     VIDEO BRONCHOSCOPY WITH ENDOBRONCHIAL NAVIGATION N/A 01/24/2021   Procedure: VIDEO BRONCHOSCOPY WITH ENDOBRONCHIAL NAVIGATION;  Surgeon: Garner Nash, DO;  Location: [facility];  Service: Pulmonary;  Laterality: N/A;   VIDEO BRONCHOSCOPY WITH ENDOBRONCHIAL ULTRASOUND N/A 01/24/2021   Procedure: VIDEO BRONCHOSCOPY WITH ENDOBRONCHIAL ULTRASOUND;  Surgeon: Garner Nash, DO;  Location: [facility];  Service: Pulmonary;  Laterality: N/A;    REVIEW OF SYSTEMS:  A comprehensive review of systems was negative except for: Constitutional: positive for fatigue Respiratory: positive for cough and dyspnea on exertion Gastrointestinal: positive for diarrhea and nausea   PHYSICAL  EXAMINATION: General appearance: alert, cooperative, fatigued, and no distress Head: Normocephalic, without obvious abnormality, atraumatic Neck: no adenopathy, no JVD, supple, symmetrical, trachea midline, and thyroid not enlarged, symmetric, no tenderness/mass/nodules Lymph nodes: Cervical, supraclavicular, and axillary nodes normal. Resp: clear to auscultation bilaterally Back: negative, symmetric, no curvature. ROM normal. No CVA tenderness. Cardio: regular rate and rhythm, S1, S2 normal, no murmur, click, rub or gallop GI: soft, non-tender; bowel sounds normal; no masses,  no organomegaly Extremities: extremities normal, atraumatic, no cyanosis or edema  ECOG PERFORMANCE STATUS: 1 - Symptomatic but completely ambulatory  Blood pressure (!) 113/45, pulse 89, temperature 97.6 F (36.4 C), temperature source Tympanic, resp. rate 20, height 5\' 5"  (1.651 m), weight **Note De-Identified [place] Obfuscation** 209 lb 14.4 oz (95.2 kg), SpO2 (!) 80 %.  LABORATORY DATA: Lab Results  Component Value Date   WBC 3.7 (L) 03/13/2021   HGB 10.4 (L) 03/13/2021   HCT 33.8 (L) 03/13/2021   MCV 102.1 (H) 03/13/2021   PLT 121 (L) 03/13/2021      Chemistry      Component Value Date/Time   NA 139 03/13/2021 1021   NA 142 08/05/2020 1502   NA 142 08/26/2015 1027   K 4.6 03/13/2021 1021   K 4.3 08/26/2015 1027   CL 102 03/13/2021 1021   CO2 30 03/13/2021 1021   CO2 34 (H) 08/26/2015 1027   BUN 19 03/13/2021 1021   BUN 14 08/05/2020 1502   BUN 9.6 08/26/2015 1027   CREATININE 1.08 (H) 03/13/2021 1021   CREATININE 0.7 08/26/2015 1027      Component Value Date/Time   CALCIUM 8.5 (L) 03/13/2021 1021   CALCIUM 9.5 08/26/2015 1027   ALKPHOS 138 (H) 03/13/2021 1021   ALKPHOS 131 08/26/2015 1027   AST 30 03/13/2021 1021   AST 15 08/26/2015 1027   ALT 21 03/13/2021 1021   ALT 15 08/26/2015 1027   BILITOT 0.9 03/13/2021 1021   BILITOT 0.66 08/26/2015 1027       RADIOGRAPHIC STUDIES: No results found.  ASSESSMENT AND PLAN: This  is a very pleasant 65 years old white female recently diagnosed with unresectable stage IIb (T1c, N1, M0) non-small cell lung cancer, squamous cell carcinoma diagnosed in April 2022 and presented with left upper lobe lung nodule in addition to left hilar adenopathy. The patient is currently undergoing a course of concurrent chemoradiation with weekly carboplatin for AUC of 2 and paclitaxel 45 Mg/M2 status post 3 cycles.  The patient continues to tolerate her treatment well with no concerning adverse effect except for mild nausea and occasional diarrhea. I recommended for her to continue her treatment and she will proceed with cycle #4 today. For the diarrhea, she will continue on Imodium on as-needed basis. The patient will come back for follow-up visit in 2 weeks for evaluation before starting cycle #6. She was advised to call immediately if she has any other concerning symptoms in the interval. The patient voices understanding of current disease status and treatment options and is in agreement with the current care plan.  All questions were answered. The patient knows to call the clinic with any problems, questions or concerns. We can certainly see the patient much sooner if necessary.  The total time spent in the appointment was 20 minutes.  Disclaimer: This note was dictated with voice recognition software. Similar sounding words can inadvertently be transcribed and may not be corrected upon review.

## 2021-03-14 ENCOUNTER — Ambulatory Visit
Admission: RE | Admit: 2021-03-14 | Discharge: 2021-03-14 | Disposition: A | Discharge status: Home / Self Care | Payer: Medicare Other | Source: Ambulatory Visit | Attending: Radiation Oncology | Admitting: Radiation Oncology

## 2021-03-14 DIAGNOSIS — C3412 Malignant neoplasm of upper lobe, left bronchus or lung: Secondary | ICD-10-CM | POA: Diagnosis not present

## 2021-03-14 DIAGNOSIS — Z510 Encounter for antineoplastic radiation therapy: Secondary | ICD-10-CM | POA: Diagnosis not present

## 2021-03-15 ENCOUNTER — Ambulatory Visit
Admission: RE | Admit: 2021-03-15 | Discharge: 2021-03-15 | Disposition: A | Discharge status: Home / Self Care | Payer: Medicare Other | Source: Ambulatory Visit | Attending: Radiation Oncology | Admitting: Radiation Oncology

## 2021-03-15 ENCOUNTER — Other Ambulatory Visit: Payer: Self-pay

## 2021-03-15 DIAGNOSIS — C3412 Malignant neoplasm of upper lobe, left bronchus or lung: Secondary | ICD-10-CM | POA: Diagnosis not present

## 2021-03-15 DIAGNOSIS — Z510 Encounter for antineoplastic radiation therapy: Secondary | ICD-10-CM | POA: Diagnosis not present

## 2021-03-16 ENCOUNTER — Ambulatory Visit
Admission: RE | Admit: 2021-03-16 | Discharge: 2021-03-16 | Disposition: A | Discharge status: Home / Self Care | Payer: Medicare Other | Source: Ambulatory Visit | Attending: Radiation Oncology | Admitting: Radiation Oncology

## 2021-03-16 DIAGNOSIS — Z510 Encounter for antineoplastic radiation therapy: Secondary | ICD-10-CM | POA: Diagnosis not present

## 2021-03-16 DIAGNOSIS — C3412 Malignant neoplasm of upper lobe, left bronchus or lung: Secondary | ICD-10-CM | POA: Diagnosis not present

## 2021-03-17 ENCOUNTER — Other Ambulatory Visit: Payer: Self-pay

## 2021-03-17 ENCOUNTER — Ambulatory Visit
Admission: RE | Admit: 2021-03-17 | Discharge: 2021-03-17 | Disposition: A | Discharge status: Home / Self Care | Payer: Medicare Other | Source: Ambulatory Visit | Attending: Radiation Oncology | Admitting: Radiation Oncology

## 2021-03-17 ENCOUNTER — Ambulatory Visit (INDEPENDENT_AMBULATORY_CARE_PROVIDER_SITE_OTHER): Payer: Medicare Other | Admitting: Family Medicine

## 2021-03-17 DIAGNOSIS — R530 Neoplastic (malignant) related fatigue: Secondary | ICD-10-CM | POA: Diagnosis not present

## 2021-03-17 DIAGNOSIS — J9611 Chronic respiratory failure with hypoxia: Secondary | ICD-10-CM

## 2021-03-17 DIAGNOSIS — F419 Anxiety disorder, unspecified: Secondary | ICD-10-CM | POA: Diagnosis not present

## 2021-03-17 DIAGNOSIS — C3412 Malignant neoplasm of upper lobe, left bronchus or lung: Secondary | ICD-10-CM | POA: Diagnosis not present

## 2021-03-17 DIAGNOSIS — Z510 Encounter for antineoplastic radiation therapy: Secondary | ICD-10-CM | POA: Diagnosis not present

## 2021-03-17 MED ORDER — BREZTRI AEROSPHERE 160-9-4.8 MCG/ACT IN AERO: 2.0000 | INHALATION_SPRAY | Freq: Two times a day (BID) | RESPIRATORY_TRACT | 5 refills | Status: DC

## 2021-03-17 MED ORDER — FLUOXETINE HCL 20 MG PO CAPS: 20.0000 mg | ORAL_CAPSULE | Freq: Every day | ORAL | 3 refills | Status: DC

## 2021-03-17 NOTE — Progress Notes (Signed)
**Note De-Identified [place] Obfuscation** Telephone visit  Subjective: CC:  f/u mood PCP: [name], DO IRC:[id number] Hailey Hernandez is a 65 y.o. female calls for telephone consult today. Patient provides verbal consent for consult held [place] phone.  Due to COVID-19 pandemic this visit was conducted virtually. This visit type was conducted due to national recommendations for restrictions regarding the COVID-19 Pandemic (e.g. social distancing, sheltering in place) in an effort to limit this patient's exposure and mitigate transmission in our community. All issues noted in this document were discussed and addressed.  A physical exam was not performed with this format.   Location of patient: home Location of provider: [facility] Others present for call: spouse  1.  Depression/anxiety Patient is now undergoing treatment for left lung cancer.  She is currently receiving radiation and chemotherapy.  She does report fatigue but overall she feels that her mood has been pretty good.  Does not feel that she needs any adjustment of the Prozac.  Denies depressive or anxiety symptoms.  Needs refills on her breztri.  Currently receiving samples from pulmonology but next OV is not until fall.   ROS: Per HPI  Allergies  Allergen Reactions   Doxycycline Shortness Of Breath and Other (See Comments)    Drug interaction with Soriatane   Tramadol Shortness Of Breath    Did not work   Milnacipran     REACTION: dizzy   Apremilast Rash    Rash, able to take this currently   Past Medical History:  Diagnosis Date   Allergy    Anxiety disorder    Arthritis    bil legs   Asthma    Cancer (HCC)    Chronic bronchitis    Complication of anesthesia    slow to wake up, one time she turned blue in recovery   COPD (chronic obstructive pulmonary disease) (HCC)    Depression    Diverticulitis    DJD (degenerative joint disease)    spine   Dyspnea    O2 at 3 L 24/7   Fibromyalgia    GERD (gastroesophageal reflux disease)    Glaucoma    History of  thyroid cancer    HTN (hypertension)    Hyperlipidemia    Impaired glucose tolerance 05/08/2013   OSA (obstructive sleep apnea)    CPAP  last sleep study 8-10 yr.ag0   Polymyalgia (HCC)    Pre-diabetes    Psoriasis    Rhinitis    Tobacco abuse     Current Outpatient Medications:    albuterol (PROVENTIL) (2.5 MG/3ML) 0.083% nebulizer solution, Take 3 mLs (2.5 mg total) by nebulization every 4 (four) hours as needed for wheezing or shortness of breath. DX: 496, Disp: 180 mL, Rfl: 12   albuterol (VENTOLIN HFA) 108 (90 Base) MCG/ACT inhaler, Inhale 2 puffs every 4-6 hors as needed- rescue, Disp: 18 g, Rfl: 12   alendronate (FOSAMAX) 70 MG tablet, Take 1 tablet (70 mg total) by mouth every 7 (seven) days. Take with a full glass of water on an empty stomach. (Patient taking differently: Take 70 mg by mouth every 7 (seven) days. Take with a full glass of water on an empty stomach. Sunday), Disp: 4 tablet, Rfl: 11   aspirin EC 81 MG tablet, Take 81 mg by mouth daily., Disp: , Rfl:    atorvastatin (LIPITOR) 80 MG tablet, TAKE 1 TABLET ONCE DAILY, Disp: 90 tablet, Rfl: 0   Budeson-Glycopyrrol-Formoterol (BREZTRI AEROSPHERE) 160-9-4.8 MCG/ACT AERO, Inhale 2 puffs into the lungs 2 (two) times daily., **Note De-Identified [place] Obfuscation** Disp: 5.9 g, Rfl: 0   calcium carbonate (CALTRATE 600) 1500 (600 Ca) MG TABS tablet, Take 1 tablet (1,500 mg total) by mouth 2 (two) times daily with a meal., Disp: 60 tablet, Rfl: 3   colchicine 0.6 MG tablet, TAKE 1 TABLET ONCE DAILY, Disp: 30 tablet, Rfl: 0   FLUoxetine (PROZAC) 20 MG capsule, TAKE 1 CAPSULE ONCE DAILY, Disp: 90 capsule, Rfl: 0   fluticasone (FLONASE) 50 MCG/ACT nasal spray, Place 2 sprays into both nostrils daily., Disp: 16 g, Rfl: 6   folic acid (FOLVITE) 1 MG tablet, Take 1 mg by mouth at bedtime., Disp: , Rfl:    lisinopril (ZESTRIL) 20 MG tablet, TAKE 1 TABLET BY MOUTH DAILY., Disp: 90 tablet, Rfl: 0   montelukast (SINGULAIR) 10 MG tablet, TAKE 1 TABLET EVERY DAY (Patient taking  differently: Take 10 mg by mouth at bedtime.), Disp: 90 tablet, Rfl: 0   Nebulizers (COMPRESSOR/NEBULIZER) MISC, Use up to 4 times daily when needed, Disp: 1 each, Rfl: 0   prochlorperazine (COMPAZINE) 10 MG tablet, Take 1 tablet (10 mg total) by mouth every 6 (six) hours as needed for nausea or vomiting., Disp: 30 tablet, Rfl: 0   Respiratory Therapy Supplies (FLUTTER) DEVI, Patient to use device for ten minutes, three times daily, Disp: 1 each, Rfl: 0   sucralfate (CARAFATE) 1 g tablet, Take 1 tablet (1 g total) by mouth 4 (four) times daily. Dissolve each tablet in 15 cc water before use., Disp: 120 tablet, Rfl: 2  Assessment/ Plan: 65 y.o. female   Squamous cell carcinoma of bronchus in left upper lobe (HCC) - Plan: Budeson-Glycopyrrol-Formoterol (BREZTRI AEROSPHERE) 160-9-4.8 MCG/ACT AERO  Neoplastic malignant related fatigue  Anxiety - Plan: FLUoxetine (PROZAC) 20 MG capsule  Chronic respiratory failure with hypoxia (HCC) - Plan: Budeson-Glycopyrrol-Formoterol (BREZTRI AEROSPHERE) 160-9-4.8 MCG/ACT AERO  Continue following up with oncology as scheduled for chemotherapy and radiation treatments.  I suspect the fatigue is related to the neoplasm.  Would like to see her back the next couple of months to ensure that this is getting better when she is done with her treatments  She is stable on Prozac 20 mg daily and no changes were made.  Renewals have been sent  Additional supply of Breztri placed upfront for her and a new prescription has been sent to her pharmacy.  Keep follow-up with pulmonology as scheduled in the fall  Start time: 12:48pm (LVM); called on Home# 12:49pm (no answer); 2:56pm (called spouse's cell per daughter) End time: 3:04pm  Total time spent on patient care (including telephone call/ virtual visit): 8 minutes  [place], [place] [phone]

## 2021-03-20 ENCOUNTER — Inpatient Hospital Stay: Payer: Medicare Other

## 2021-03-20 ENCOUNTER — Inpatient Hospital Stay (HOSPITAL_BASED_OUTPATIENT_CLINIC_OR_DEPARTMENT_OTHER): Payer: Medicare Other | Admitting: Internal Medicine

## 2021-03-20 ENCOUNTER — Other Ambulatory Visit: Payer: Self-pay

## 2021-03-20 ENCOUNTER — Ambulatory Visit
Admission: RE | Admit: 2021-03-20 | Discharge: 2021-03-20 | Disposition: A | Discharge status: Home / Self Care | Payer: Medicare Other | Source: Ambulatory Visit | Attending: Radiation Oncology | Admitting: Radiation Oncology

## 2021-03-20 VITALS — BP 125/63 | HR 79 | Temp 98.7°F | Resp 18 | Ht 65.0 in | Wt 212.5 lb

## 2021-03-20 DIAGNOSIS — Z85850 Personal history of malignant neoplasm of thyroid: Secondary | ICD-10-CM | POA: Diagnosis not present

## 2021-03-20 DIAGNOSIS — C3412 Malignant neoplasm of upper lobe, left bronchus or lung: Secondary | ICD-10-CM

## 2021-03-20 DIAGNOSIS — Z923 Personal history of irradiation: Secondary | ICD-10-CM | POA: Diagnosis not present

## 2021-03-20 DIAGNOSIS — R197 Diarrhea, unspecified: Secondary | ICD-10-CM | POA: Diagnosis not present

## 2021-03-20 DIAGNOSIS — Z881 Allergy status to other antibiotic agents status: Secondary | ICD-10-CM | POA: Diagnosis not present

## 2021-03-20 DIAGNOSIS — I10 Essential (primary) hypertension: Secondary | ICD-10-CM | POA: Diagnosis not present

## 2021-03-20 DIAGNOSIS — Z79899 Other long term (current) drug therapy: Secondary | ICD-10-CM | POA: Diagnosis not present

## 2021-03-20 DIAGNOSIS — Z5111 Encounter for antineoplastic chemotherapy: Secondary | ICD-10-CM | POA: Diagnosis not present

## 2021-03-20 DIAGNOSIS — Z9049 Acquired absence of other specified parts of digestive tract: Secondary | ICD-10-CM | POA: Diagnosis not present

## 2021-03-20 DIAGNOSIS — R110 Nausea: Secondary | ICD-10-CM | POA: Diagnosis not present

## 2021-03-20 DIAGNOSIS — G4733 Obstructive sleep apnea (adult) (pediatric): Secondary | ICD-10-CM | POA: Diagnosis not present

## 2021-03-20 DIAGNOSIS — R5383 Other fatigue: Secondary | ICD-10-CM | POA: Diagnosis not present

## 2021-03-20 DIAGNOSIS — J449 Chronic obstructive pulmonary disease, unspecified: Secondary | ICD-10-CM | POA: Diagnosis not present

## 2021-03-20 DIAGNOSIS — R590 Localized enlarged lymph nodes: Secondary | ICD-10-CM | POA: Diagnosis not present

## 2021-03-20 DIAGNOSIS — Z510 Encounter for antineoplastic radiation therapy: Secondary | ICD-10-CM | POA: Diagnosis not present

## 2021-03-20 DIAGNOSIS — R0609 Other forms of dyspnea: Secondary | ICD-10-CM | POA: Diagnosis not present

## 2021-03-20 DIAGNOSIS — Z888 Allergy status to other drugs, medicaments and biological substances status: Secondary | ICD-10-CM | POA: Diagnosis not present

## 2021-03-20 DIAGNOSIS — R059 Cough, unspecified: Secondary | ICD-10-CM | POA: Diagnosis not present

## 2021-03-20 DIAGNOSIS — Z885 Allergy status to narcotic agent status: Secondary | ICD-10-CM | POA: Diagnosis not present

## 2021-03-20 LAB — CBC WITH DIFFERENTIAL (CANCER CENTER ONLY)
Abs Immature Granulocytes: 0.02 10*3/uL (ref 0.00–0.07)
Basophils Absolute: 0 10*3/uL (ref 0.0–0.1)
Basophils Relative: 0 %
Eosinophils Absolute: 0.1 10*3/uL (ref 0.0–0.5)
Eosinophils Relative: 2 %
HCT: 32.8 % — ABNORMAL LOW (ref 36.0–46.0)
Hemoglobin: 10.1 g/dL — ABNORMAL LOW (ref 12.0–15.0)
Immature Granulocytes: 1 %
Lymphocytes Relative: 19 %
Lymphs Abs: 0.6 10*3/uL — ABNORMAL LOW (ref 0.7–4.0)
MCH: 32 pg (ref 26.0–34.0)
MCHC: 30.8 g/dL (ref 30.0–36.0)
MCV: 103.8 fL — ABNORMAL HIGH (ref 80.0–100.0)
Monocytes Absolute: 0.3 10*3/uL (ref 0.1–1.0)
Monocytes Relative: 10 %
Neutro Abs: 2.1 10*3/uL (ref 1.7–7.7)
Neutrophils Relative %: 68 %
Platelet Count: 82 10*3/uL — ABNORMAL LOW (ref 150–400)
RBC: 3.16 MIL/uL — ABNORMAL LOW (ref 3.87–5.11)
RDW: 17.4 % — ABNORMAL HIGH (ref 11.5–15.5)
WBC Count: 3.2 10*3/uL — ABNORMAL LOW (ref 4.0–10.5)
nRBC: 0 % (ref 0.0–0.2)

## 2021-03-20 LAB — CMP (CANCER CENTER ONLY)
ALT: 27 U/L (ref 0–44)
AST: 38 U/L (ref 15–41)
Albumin: 3.4 g/dL — ABNORMAL LOW (ref 3.5–5.0)
Alkaline Phosphatase: 140 U/L — ABNORMAL HIGH (ref 38–126)
Anion gap: 6 (ref 5–15)
BUN: 19 mg/dL (ref 8–23)
CO2: 34 mmol/L — ABNORMAL HIGH (ref 22–32)
Calcium: 8.7 mg/dL — ABNORMAL LOW (ref 8.9–10.3)
Chloride: 100 mmol/L (ref 98–111)
Creatinine: 1.02 mg/dL — ABNORMAL HIGH (ref 0.44–1.00)
GFR, Estimated: >60 mL/min (ref >60)
Glucose, Bld: 101 mg/dL — ABNORMAL HIGH (ref 70–99)
Potassium: 4.4 mmol/L (ref 3.5–5.1)
Sodium: 140 mmol/L (ref 135–145)
Total Bilirubin: 0.6 mg/dL (ref 0.3–1.2)
Total Protein: 6.9 g/dL (ref 6.5–8.1)

## 2021-03-20 MED ORDER — DIPHENHYDRAMINE HCL 50 MG/ML IJ SOLN
50.0000 mg | Freq: Once | INTRAMUSCULAR | Status: AC
Start: 2021-03-20 — End: 2021-03-20
  Administered 2021-03-20: 50 mg via INTRAVENOUS

## 2021-03-20 MED ORDER — SODIUM CHLORIDE 0.9 % IV SOLN
20.0000 mg | Freq: Once | INTRAVENOUS | Status: AC
  Administered 2021-03-20: 20 mg via INTRAVENOUS
  Filled 2021-03-20: qty 20

## 2021-03-20 MED ORDER — SODIUM CHLORIDE 0.9 % IV SOLN
45.0000 mg/m2 | Freq: Once | INTRAVENOUS | Status: AC
  Administered 2021-03-20: 96 mg via INTRAVENOUS
  Filled 2021-03-20: qty 16

## 2021-03-20 MED ORDER — PALONOSETRON HCL INJECTION 0.25 MG/5ML
INTRAVENOUS | Status: AC
  Filled 2021-03-20: qty 5

## 2021-03-20 MED ORDER — DIPHENHYDRAMINE HCL 50 MG/ML IJ SOLN
INTRAMUSCULAR | Status: AC
  Filled 2021-03-20: qty 1

## 2021-03-20 MED ORDER — FAMOTIDINE 20 MG IN NS 100 ML IVPB
20.0000 mg | Freq: Once | INTRAVENOUS | Status: AC
Start: 2021-03-20 — End: 2021-03-20
  Administered 2021-03-20: 20 mg via INTRAVENOUS

## 2021-03-20 MED ORDER — SODIUM CHLORIDE 0.9 % IV SOLN
Freq: Once | INTRAVENOUS | Status: AC
  Filled 2021-03-20: qty 250

## 2021-03-20 MED ORDER — FAMOTIDINE 20 MG IN NS 100 ML IVPB
INTRAVENOUS | Status: AC
  Filled 2021-03-20: qty 100

## 2021-03-20 MED ORDER — SODIUM CHLORIDE 0.9 % IV SOLN
173.6000 mg | Freq: Once | INTRAVENOUS | Status: AC
  Administered 2021-03-20: 170 mg via INTRAVENOUS
  Filled 2021-03-20: qty 17

## 2021-03-20 MED ORDER — PALONOSETRON HCL INJECTION 0.25 MG/5ML
0.2500 mg | Freq: Once | INTRAVENOUS | Status: AC
  Administered 2021-03-20: 0.25 mg via INTRAVENOUS

## 2021-03-20 NOTE — Progress Notes (Signed)
**Note De-Identified [place] Obfuscation** Per Dr. Providence Lanius to treat with today's labs

## 2021-03-20 NOTE — Patient Instructions (Signed)
**Note De-identified [place] Obfuscation** [facility]  Discharge Instructions: Thank you for choosing [facility] to provide your oncology and hematology care.   If you have a lab appointment with the Cancer Center, please go directly to the Cancer Center and check in at the registration area.   Wear comfortable clothing and clothing appropriate for easy access to any Portacath or PICC line.   We strive to give you quality time with your provider. You may need to reschedule your appointment if you arrive late (15 or more minutes).  Arriving late affects you and other patients whose appointments are after yours.  Also, if you miss three or more appointments without notifying the office, you may be dismissed from the clinic at the provider's discretion.      For prescription refill requests, have your pharmacy contact our office and allow 72 hours for refills to be completed.    Today you received the following chemotherapy and/or immunotherapy agents: Paclitaxel (Taxol) and Carboplatin.   To help prevent nausea and vomiting after your treatment, we encourage you to take your nausea medication as directed.  BELOW ARE SYMPTOMS THAT SHOULD BE REPORTED IMMEDIATELY: *FEVER GREATER THAN 100.4 F (38 C) OR HIGHER *CHILLS OR SWEATING *NAUSEA AND VOMITING THAT IS NOT CONTROLLED WITH YOUR NAUSEA MEDICATION *UNUSUAL SHORTNESS OF BREATH *UNUSUAL BRUISING OR BLEEDING *URINARY PROBLEMS (pain or burning when urinating, or frequent urination) *BOWEL PROBLEMS (unusual diarrhea, constipation, pain near the anus) TENDERNESS IN MOUTH AND THROAT WITH OR WITHOUT PRESENCE OF ULCERS (sore throat, sores in mouth, or a toothache) UNUSUAL RASH, SWELLING OR PAIN  UNUSUAL VAGINAL DISCHARGE OR ITCHING   Items with * indicate a potential emergency and should be followed up as soon as possible or go to the Emergency Department if any problems should occur.  Please show the CHEMOTHERAPY ALERT CARD or IMMUNOTHERAPY  ALERT CARD at check-in to the Emergency Department and triage nurse.  Should you have questions after your visit or need to cancel or reschedule your appointment, please contact [facility]  Dept: [phone]  and follow the prompts.  Office hours are 8:00 a.m. to 4:30 p.m. Monday - Friday. Please note that voicemails left after 4:00 p.m. may not be returned until the following business day.  We are closed weekends and major holidays. You have access to a nurse at all times for urgent questions. Please call the main number to the clinic Dept: [phone] and follow the prompts.   For any non-urgent questions, you may also contact your provider using MyChart. We now offer e-Visits for anyone 18 and older to request care online for non-urgent symptoms. For details visit [email or web address].   Also download the MyChart app! Go to the app store, search "MyChart", open the app, select [facility], and log in with your MyChart username and password.  Due to Covid, a mask is required upon entering the hospital/clinic. If you do not have a mask, one will be given to you upon arrival. For doctor visits, patients may have 1 support person aged 18 or older with them. For treatment visits, patients cannot have anyone with them due to current Covid guidelines and our immunocompromised population.   

## 2021-03-20 NOTE — Progress Notes (Signed)
**Note De-Identified [facility]** [place] [phone]   [phone]  OFFICE PROGRESS NOTE  [name], DO [place] [address]  DIAGNOSIS: Stage IIb (T1c, N1, M0) non-small cell lung cancer, squamous cell carcinoma presented with left upper lobe lung nodule in addition to left hilar adenopathy diagnosed in April 2022.   PRIOR THERAPY: None   CURRENT THERAPY: Concurrent chemoradiation with weekly carboplatin for AUC of 2 and paclitaxel 45 Mg/M2 for 6-7 weeks. First dose on 02/21/21. Status post 4 cycles.   INTERVAL HISTORY: Hailey Hernandez 65 y.o. female was seen in the chemo room today during her chemotherapy treatment for evaluation of swelling of the right lower extremity.  The patient mentions that she has a swelling for few weeks now but it is getting worse with some pain and the right calf area.  She denied having any significant shortness of breath or palpitation.  She denied having any fever or chills.  She has no chest pain, cough or hemoptysis.  She denied having any recent weight loss or night sweats.  She has no nausea, vomiting, diarrhea or constipation.  She continues to tolerate her concurrent chemoradiation fairly well.  MEDICAL HISTORY: Past Medical History:  Diagnosis Date   Allergy    Anxiety disorder    Arthritis    bil legs   Asthma    Cancer ([place])    Chronic bronchitis    Complication of anesthesia    slow to wake up, one time she turned blue in recovery   COPD (chronic obstructive pulmonary disease) (HCC)    Depression    Diverticulitis    DJD (degenerative joint disease)    spine   Dyspnea    O2 at 3 L 24/7   Fibromyalgia    GERD (gastroesophageal reflux disease)    Glaucoma    History of thyroid cancer    HTN (hypertension)    Hyperlipidemia    Impaired glucose tolerance 05/08/2013   OSA (obstructive sleep apnea)    CPAP  last sleep study 8-10 yr.ag0   Polymyalgia ([place])    Pre-diabetes    Psoriasis    Rhinitis     Tobacco abuse     ALLERGIES:  is allergic to doxycycline, tramadol, milnacipran, and apremilast.  MEDICATIONS:  Current Outpatient Medications  Medication Sig Dispense Refill   albuterol (PROVENTIL) (2.5 MG/3ML) 0.083% nebulizer solution Take 3 mLs (2.5 mg total) by nebulization every 4 (four) hours as needed for wheezing or shortness of breath. DX: 496 180 mL 12   albuterol (VENTOLIN HFA) 108 (90 Base) MCG/ACT inhaler Inhale 2 puffs every 4-6 hors as needed- rescue 18 g 12   alendronate (FOSAMAX) 70 MG tablet Take 1 tablet (70 mg total) by mouth every 7 (seven) days. Take with a full glass of water on an empty stomach. (Patient taking differently: Take 70 mg by mouth every 7 (seven) days. Take with a full glass of water on an empty stomach. Sunday) 4 tablet 11   aspirin EC 81 MG tablet Take 81 mg by mouth daily.     atorvastatin (LIPITOR) 80 MG tablet TAKE 1 TABLET ONCE DAILY 90 tablet 0   Budeson-Glycopyrrol-Formoterol (BREZTRI AEROSPHERE) 160-9-4.8 MCG/ACT AERO Inhale 2 puffs into the lungs 2 (two) times daily. 10.7 g 5   calcium carbonate (CALTRATE 600) 1500 (600 Ca) MG TABS tablet Take 1 tablet (1,500 mg total) by mouth 2 (two) times daily with a meal. 60 tablet 3 **Note De-Identified [facility]** colchicine 0.6 MG tablet TAKE 1 TABLET ONCE DAILY 30 tablet 0   FLUoxetine (PROZAC) 20 MG capsule Take 1 capsule (20 mg total) by mouth daily. 90 capsule 3   fluticasone (FLONASE) 50 MCG/ACT nasal spray Place 2 sprays into both nostrils daily. 16 g 6   folic acid (FOLVITE) 1 MG tablet Take 1 mg by mouth at bedtime.     lisinopril (ZESTRIL) 20 MG tablet TAKE 1 TABLET BY MOUTH DAILY. 90 tablet 0   montelukast (SINGULAIR) 10 MG tablet TAKE 1 TABLET EVERY DAY (Patient taking differently: Take 10 mg by mouth at bedtime.) 90 tablet 0   Nebulizers (COMPRESSOR/NEBULIZER) MISC Use up to 4 times daily when needed 1 each 0   prochlorperazine (COMPAZINE) 10 MG tablet Take 1 tablet (10 mg total) by mouth every 6 (six) hours as needed for  nausea or vomiting. 30 tablet 0   Respiratory Therapy Supplies (FLUTTER) DEVI Patient to use device for ten minutes, three times daily 1 each 0   sucralfate (CARAFATE) 1 g tablet Take 1 tablet (1 g total) by mouth 4 (four) times daily. Dissolve each tablet in 15 cc water before use. 120 tablet 2   No current facility-administered medications for this visit.   Facility-Administered Medications Ordered in Other Visits  Medication Dose Route Frequency Provider Last Rate Last Admin   CARBOplatin (PARAPLATIN) 170 mg in sodium chloride 0.9 % 100 mL chemo infusion  170 mg Intravenous Once Curt Bears, MD       dexamethasone (DECADRON) 20 mg in sodium chloride 0.9 % 50 mL IVPB  20 mg Intravenous Once Curt Bears, MD 208 mL/hr at 03/20/21 1150 20 mg at 03/20/21 1150   PACLitaxel (TAXOL) 96 mg in sodium chloride 0.9 % 250 mL chemo infusion (</= 80mg /m2)  45 mg/m2 (Treatment Plan Recorded) Intravenous Once Curt Bears, MD        SURGICAL HISTORY:  Past Surgical History:  Procedure Laterality Date   APPENDECTOMY     BRONCHIAL BIOPSY  01/24/2021   Procedure: BRONCHIAL BIOPSIES;  Surgeon: Garner Nash, DO;  Location: [facility];  Service: Pulmonary;;   BRONCHIAL BRUSHINGS  01/24/2021   Procedure: BRONCHIAL BRUSHINGS;  Surgeon: Garner Nash, DO;  Location: [facility];  Service: Pulmonary;;   BRONCHIAL NEEDLE ASPIRATION BIOPSY  01/24/2021   Procedure: BRONCHIAL NEEDLE ASPIRATION BIOPSIES;  Surgeon: Garner Nash, DO;  Location: [facility];  Service: Pulmonary;;   CARPAL TUNNEL RELEASE  10/30/2011   Procedure: CARPAL TUNNEL RELEASE;  Surgeon: Wynonia Sours, MD;  Location: [facility];  Service: Orthopedics;  Laterality: Right;  and mass excision   CHOLECYSTECTOMY     CHOLESTEATOMA EXCISION     right ear   left shoulder spurs     ORIF right lower leg     partial throidectomy     TOTAL ABDOMINAL HYSTERECTOMY     VIDEO BRONCHOSCOPY WITH ENDOBRONCHIAL NAVIGATION  N/A 01/24/2021   Procedure: VIDEO BRONCHOSCOPY WITH ENDOBRONCHIAL NAVIGATION;  Surgeon: Garner Nash, DO;  Location: [facility];  Service: Pulmonary;  Laterality: N/A;   VIDEO BRONCHOSCOPY WITH ENDOBRONCHIAL ULTRASOUND N/A 01/24/2021   Procedure: VIDEO BRONCHOSCOPY WITH ENDOBRONCHIAL ULTRASOUND;  Surgeon: Garner Nash, DO;  Location: [facility];  Service: Pulmonary;  Laterality: N/A;    REVIEW OF SYSTEMS:  A comprehensive review of systems was negative except for: Constitutional: positive for fatigue   PHYSICAL EXAMINATION: General appearance: alert, cooperative, fatigued, and no distress Head: Normocephalic, without obvious abnormality, atraumatic Neck: no **Note De-Identified [facility]** adenopathy, no JVD, supple, symmetrical, trachea midline, and thyroid not enlarged, symmetric, no tenderness/mass/nodules Lymph nodes: Cervical, supraclavicular, and axillary nodes normal. Resp: clear to auscultation bilaterally Back: negative, symmetric, no curvature. ROM normal. No CVA tenderness. Cardio: regular rate and rhythm, S1, S2 normal, no murmur, click, rub or gallop GI: soft, non-tender; bowel sounds normal; no masses,  no organomegaly Extremities: edema 1+ edema bilaterally more on the right with darkening of the skin of the shin area and mild tenderness to palpation on the left calf.  ECOG PERFORMANCE STATUS: 1 - Symptomatic but completely ambulatory  There were no vitals taken for this visit.  LABORATORY DATA: Lab Results  Component Value Date   WBC 3.2 (L) 03/20/2021   HGB 10.1 (L) 03/20/2021   HCT 32.8 (L) 03/20/2021   MCV 103.8 (H) 03/20/2021   PLT 82 (L) 03/20/2021      Chemistry      Component Value Date/Time   NA 140 03/20/2021 1000   NA 142 08/05/2020 1502   NA 142 08/26/2015 1027   K 4.4 03/20/2021 1000   K 4.3 08/26/2015 1027   CL 100 03/20/2021 1000   CO2 34 (H) 03/20/2021 1000   CO2 34 (H) 08/26/2015 1027   BUN 19 03/20/2021 1000   BUN 14 08/05/2020 1502   BUN 9.6 08/26/2015 1027    CREATININE 1.02 (H) 03/20/2021 1000   CREATININE 0.7 08/26/2015 1027      Component Value Date/Time   CALCIUM 8.7 (L) 03/20/2021 1000   CALCIUM 9.5 08/26/2015 1027   ALKPHOS 140 (H) 03/20/2021 1000   ALKPHOS 131 08/26/2015 1027   AST 38 03/20/2021 1000   AST 15 08/26/2015 1027   ALT 27 03/20/2021 1000   ALT 15 08/26/2015 1027   BILITOT 0.6 03/20/2021 1000   BILITOT 0.66 08/26/2015 1027       RADIOGRAPHIC STUDIES: No results found.  ASSESSMENT AND PLAN: This is a very pleasant 65 years old white female recently diagnosed with unresectable stage IIb (T1c, N1, M0) non-small cell lung cancer, squamous cell carcinoma diagnosed in April 2022 and presented with left upper lobe lung nodule in addition to left hilar adenopathy. The patient is currently undergoing a course of concurrent chemoradiation with weekly carboplatin for AUC of 2 and paclitaxel 45 Mg/M2 status post 4 cycles.   The patient continues to tolerate her treatment with concurrent chemoradiation fairly well. I recommended for her to proceed with cycle #5 today as planned. For the swelling of the right leg, I will order Doppler of the right lower extremity to rule out deep venous thrombosis. The patient will come back for follow-up visit next week as previously planned. She was advised to call immediately if she has any other concerning symptoms in the interval. The patient voices understanding of current disease status and treatment options and is in agreement with the current care plan.  All questions were answered. The patient knows to call the clinic with any problems, questions or concerns. We can certainly see the patient much sooner if necessary.  Disclaimer: This note was dictated with voice recognition software. Similar sounding words can inadvertently be transcribed and may not be corrected upon review.

## 2021-03-21 ENCOUNTER — Ambulatory Visit (HOSPITAL_COMMUNITY)
Admission: RE | Admit: 2021-03-21 | Discharge: 2021-03-21 | Disposition: A | Discharge status: Home / Self Care | Payer: Medicare Other | Source: Ambulatory Visit | Attending: Internal Medicine | Admitting: Internal Medicine

## 2021-03-21 ENCOUNTER — Ambulatory Visit
Admission: RE | Admit: 2021-03-21 | Discharge: 2021-03-21 | Disposition: A | Discharge status: Home / Self Care | Payer: Medicare Other | Source: Ambulatory Visit | Attending: Radiation Oncology | Admitting: Radiation Oncology

## 2021-03-21 ENCOUNTER — Encounter: Payer: Self-pay | Admitting: Radiation Oncology

## 2021-03-21 DIAGNOSIS — M79604 Pain in right leg: Secondary | ICD-10-CM

## 2021-03-21 DIAGNOSIS — Z510 Encounter for antineoplastic radiation therapy: Secondary | ICD-10-CM | POA: Diagnosis not present

## 2021-03-21 DIAGNOSIS — C3412 Malignant neoplasm of upper lobe, left bronchus or lung: Secondary | ICD-10-CM | POA: Insufficient documentation

## 2021-03-21 DIAGNOSIS — M7989 Other specified soft tissue disorders: Secondary | ICD-10-CM | POA: Diagnosis not present

## 2021-03-21 NOTE — Progress Notes (Signed)
**Note De-Identified [facility]** Bilateral lower extremity venous duplex has been completed. Preliminary results can be found in CV Proc through chart review.  Results were given to [facility] at Dr. Worthy Flank office.  03/21/21 2:02 PM Carlos Levering RVT

## 2021-03-22 ENCOUNTER — Other Ambulatory Visit: Payer: Self-pay

## 2021-03-22 ENCOUNTER — Ambulatory Visit
Admission: RE | Admit: 2021-03-22 | Discharge: 2021-03-22 | Disposition: A | Discharge status: Home / Self Care | Payer: Medicare Other | Source: Ambulatory Visit | Attending: Radiation Oncology | Admitting: Radiation Oncology

## 2021-03-22 DIAGNOSIS — Z510 Encounter for antineoplastic radiation therapy: Secondary | ICD-10-CM | POA: Diagnosis not present

## 2021-03-22 DIAGNOSIS — C3412 Malignant neoplasm of upper lobe, left bronchus or lung: Secondary | ICD-10-CM | POA: Diagnosis not present

## 2021-03-23 ENCOUNTER — Ambulatory Visit
Admission: RE | Admit: 2021-03-23 | Discharge: 2021-03-23 | Disposition: A | Discharge status: Home / Self Care | Payer: Medicare Other | Source: Ambulatory Visit | Attending: Radiation Oncology | Admitting: Radiation Oncology

## 2021-03-23 ENCOUNTER — Ambulatory Visit: Payer: Medicare Other | Admitting: Internal Medicine

## 2021-03-23 DIAGNOSIS — C3412 Malignant neoplasm of upper lobe, left bronchus or lung: Secondary | ICD-10-CM | POA: Diagnosis not present

## 2021-03-23 DIAGNOSIS — Z510 Encounter for antineoplastic radiation therapy: Secondary | ICD-10-CM | POA: Diagnosis not present

## 2021-03-24 ENCOUNTER — Ambulatory Visit
Admission: RE | Admit: 2021-03-24 | Discharge: 2021-03-24 | Disposition: A | Discharge status: Home / Self Care | Payer: Medicare Other | Source: Ambulatory Visit | Attending: Radiation Oncology | Admitting: Radiation Oncology

## 2021-03-24 ENCOUNTER — Other Ambulatory Visit: Payer: Self-pay

## 2021-03-24 DIAGNOSIS — Z510 Encounter for antineoplastic radiation therapy: Secondary | ICD-10-CM | POA: Diagnosis not present

## 2021-03-24 DIAGNOSIS — C3412 Malignant neoplasm of upper lobe, left bronchus or lung: Secondary | ICD-10-CM | POA: Diagnosis not present

## 2021-03-25 DIAGNOSIS — C3412 Malignant neoplasm of upper lobe, left bronchus or lung: Secondary | ICD-10-CM | POA: Diagnosis not present

## 2021-03-25 DIAGNOSIS — Z510 Encounter for antineoplastic radiation therapy: Secondary | ICD-10-CM | POA: Diagnosis not present

## 2021-03-27 ENCOUNTER — Other Ambulatory Visit: Payer: Self-pay

## 2021-03-27 ENCOUNTER — Encounter: Payer: Self-pay | Admitting: Internal Medicine

## 2021-03-27 ENCOUNTER — Inpatient Hospital Stay: Payer: Medicare Other

## 2021-03-27 ENCOUNTER — Inpatient Hospital Stay: Payer: Medicare Other | Admitting: Internal Medicine

## 2021-03-27 ENCOUNTER — Ambulatory Visit: Payer: Medicare Other

## 2021-03-27 VITALS — BP 123/46 | HR 95 | Temp 97.5°F | Resp 19 | Ht 65.0 in | Wt 214.1 lb

## 2021-03-27 DIAGNOSIS — R197 Diarrhea, unspecified: Secondary | ICD-10-CM | POA: Diagnosis not present

## 2021-03-27 DIAGNOSIS — Z79899 Other long term (current) drug therapy: Secondary | ICD-10-CM | POA: Diagnosis not present

## 2021-03-27 DIAGNOSIS — J449 Chronic obstructive pulmonary disease, unspecified: Secondary | ICD-10-CM | POA: Diagnosis not present

## 2021-03-27 DIAGNOSIS — R059 Cough, unspecified: Secondary | ICD-10-CM | POA: Diagnosis not present

## 2021-03-27 DIAGNOSIS — G4733 Obstructive sleep apnea (adult) (pediatric): Secondary | ICD-10-CM | POA: Diagnosis not present

## 2021-03-27 DIAGNOSIS — R0609 Other forms of dyspnea: Secondary | ICD-10-CM | POA: Diagnosis not present

## 2021-03-27 DIAGNOSIS — Z9049 Acquired absence of other specified parts of digestive tract: Secondary | ICD-10-CM | POA: Diagnosis not present

## 2021-03-27 DIAGNOSIS — Z923 Personal history of irradiation: Secondary | ICD-10-CM | POA: Diagnosis not present

## 2021-03-27 DIAGNOSIS — Z85850 Personal history of malignant neoplasm of thyroid: Secondary | ICD-10-CM | POA: Diagnosis not present

## 2021-03-27 DIAGNOSIS — Z5111 Encounter for antineoplastic chemotherapy: Secondary | ICD-10-CM

## 2021-03-27 DIAGNOSIS — C3412 Malignant neoplasm of upper lobe, left bronchus or lung: Secondary | ICD-10-CM | POA: Diagnosis not present

## 2021-03-27 DIAGNOSIS — Z885 Allergy status to narcotic agent status: Secondary | ICD-10-CM | POA: Diagnosis not present

## 2021-03-27 DIAGNOSIS — R110 Nausea: Secondary | ICD-10-CM | POA: Diagnosis not present

## 2021-03-27 DIAGNOSIS — I10 Essential (primary) hypertension: Secondary | ICD-10-CM

## 2021-03-27 DIAGNOSIS — Z881 Allergy status to other antibiotic agents status: Secondary | ICD-10-CM | POA: Diagnosis not present

## 2021-03-27 DIAGNOSIS — R590 Localized enlarged lymph nodes: Secondary | ICD-10-CM | POA: Diagnosis not present

## 2021-03-27 DIAGNOSIS — Z888 Allergy status to other drugs, medicaments and biological substances status: Secondary | ICD-10-CM | POA: Diagnosis not present

## 2021-03-27 DIAGNOSIS — R5383 Other fatigue: Secondary | ICD-10-CM | POA: Diagnosis not present

## 2021-03-27 LAB — CMP (CANCER CENTER ONLY)
ALT: 21 U/L (ref 0–44)
AST: 29 U/L (ref 15–41)
Albumin: 3 g/dL — ABNORMAL LOW (ref 3.5–5.0)
Alkaline Phosphatase: 159 U/L — ABNORMAL HIGH (ref 38–126)
Anion gap: 9 (ref 5–15)
BUN: 13 mg/dL (ref 8–23)
CO2: 30 mmol/L (ref 22–32)
Calcium: 8.8 mg/dL — ABNORMAL LOW (ref 8.9–10.3)
Chloride: 102 mmol/L (ref 98–111)
Creatinine: 0.8 mg/dL (ref 0.44–1.00)
GFR, Estimated: >60 mL/min (ref >60)
Glucose, Bld: 103 mg/dL — ABNORMAL HIGH (ref 70–99)
Potassium: 4.4 mmol/L (ref 3.5–5.1)
Sodium: 141 mmol/L (ref 135–145)
Total Bilirubin: 0.7 mg/dL (ref 0.3–1.2)
Total Protein: 6.8 g/dL (ref 6.5–8.1)

## 2021-03-27 LAB — CBC WITH DIFFERENTIAL (CANCER CENTER ONLY)
Abs Immature Granulocytes: 0.01 10*3/uL (ref 0.00–0.07)
Basophils Absolute: 0 10*3/uL (ref 0.0–0.1)
Basophils Relative: 0 %
Eosinophils Absolute: 0 10*3/uL (ref 0.0–0.5)
Eosinophils Relative: 1 %
HCT: 32 % — ABNORMAL LOW (ref 36.0–46.0)
Hemoglobin: 10 g/dL — ABNORMAL LOW (ref 12.0–15.0)
Immature Granulocytes: 0 %
Lymphocytes Relative: 18 %
Lymphs Abs: 0.6 10*3/uL — ABNORMAL LOW (ref 0.7–4.0)
MCH: 32.4 pg (ref 26.0–34.0)
MCHC: 31.3 g/dL (ref 30.0–36.0)
MCV: 103.6 fL — ABNORMAL HIGH (ref 80.0–100.0)
Monocytes Absolute: 0.3 10*3/uL (ref 0.1–1.0)
Monocytes Relative: 10 %
Neutro Abs: 2.3 10*3/uL (ref 1.7–7.7)
Neutrophils Relative %: 71 %
Platelet Count: 70 10*3/uL — ABNORMAL LOW (ref 150–400)
RBC: 3.09 MIL/uL — ABNORMAL LOW (ref 3.87–5.11)
RDW: 18.8 % — ABNORMAL HIGH (ref 11.5–15.5)
WBC Count: 3.2 10*3/uL — ABNORMAL LOW (ref 4.0–10.5)
nRBC: 0 % (ref 0.0–0.2)

## 2021-03-27 MED ORDER — PALONOSETRON HCL INJECTION 0.25 MG/5ML: 0.2500 mg | Freq: Once | INTRAVENOUS | Status: DC

## 2021-03-27 MED ORDER — SODIUM CHLORIDE 0.9 % IV SOLN
Freq: Once | INTRAVENOUS | Status: AC
Start: 2021-03-27 — End: 2021-03-27
  Filled 2021-03-27: qty 250

## 2021-03-27 MED ORDER — SODIUM CHLORIDE 0.9 % IV SOLN: 45.0000 mg/m2 | Freq: Once | INTRAVENOUS | Status: DC

## 2021-03-27 MED ORDER — FAMOTIDINE 20 MG IN NS 100 ML IVPB: 20.0000 mg | Freq: Once | INTRAVENOUS | Status: DC

## 2021-03-27 MED ORDER — SODIUM CHLORIDE 0.9 % IV SOLN: 207.8000 mg | Freq: Once | INTRAVENOUS | Status: DC

## 2021-03-27 MED ORDER — SODIUM CHLORIDE 0.9 % IV SOLN
20.0000 mg | Freq: Once | INTRAVENOUS | Status: DC
  Filled 2021-03-27: qty 2

## 2021-03-27 MED ORDER — DIPHENHYDRAMINE HCL 50 MG/ML IJ SOLN: 50.0000 mg | Freq: Once | INTRAMUSCULAR | Status: DC

## 2021-03-27 NOTE — Progress Notes (Signed)
**Note De-Identified [facility]** Tx held-plts 7. Patient to rtc clinic next week, she states understanding.

## 2021-03-27 NOTE — Progress Notes (Signed)
**Note De-Identified [facility]** [place] [phone]   [phone]  OFFICE PROGRESS NOTE  Hailey Norlander, DO [place] [address]  DIAGNOSIS: Stage IIb (T1c, N1, M0) non-small cell lung cancer, squamous cell carcinoma presented with left upper lobe lung nodule in addition to left hilar adenopathy diagnosed in April 2022.   PRIOR THERAPY: None   CURRENT THERAPY: Concurrent chemoradiation with weekly carboplatin for AUC of 2 and paclitaxel 45 Mg/M2 for 6-7 weeks. First dose on 02/21/21. Status post 5 cycles.   INTERVAL HISTORY: Hailey Hernandez 65 y.o. female returns to the clinic today for follow-up visit accompanied by her husband.  The patient is feeling fine today with no concerning complaints except for the baseline shortness of breath and she is currently on home oxygen.  She has occasional nausea improved with Compazine.  She denied having any chest pain, cough or hemoptysis.  She denied having any fever or chills.  She has no current nausea, vomiting, diarrhea or constipation.  She denied having any significant weight loss or night sweats.  She is here today for evaluation before starting cycle #6 of her treatment.  MEDICAL HISTORY: Past Medical History:  Diagnosis Date   Allergy    Anxiety disorder    Arthritis    bil legs   Asthma    Cancer ([place])    Chronic bronchitis    Complication of anesthesia    slow to wake up, one time she turned blue in recovery   COPD (chronic obstructive pulmonary disease) (HCC)    Depression    Diverticulitis    DJD (degenerative joint disease)    spine   Dyspnea    O2 at 3 L 24/7   Fibromyalgia    GERD (gastroesophageal reflux disease)    Glaucoma    History of thyroid cancer    HTN (hypertension)    Hyperlipidemia    Impaired glucose tolerance 05/08/2013   OSA (obstructive sleep apnea)    CPAP  last sleep study 8-10 yr.ag0   Polymyalgia ([place])    Pre-diabetes    Psoriasis    Rhinitis    Tobacco abuse      ALLERGIES:  is allergic to doxycycline, tramadol, milnacipran, and apremilast.  MEDICATIONS:  Current Outpatient Medications  Medication Sig Dispense Refill   albuterol (PROVENTIL) (2.5 MG/3ML) 0.083% nebulizer solution Take 3 mLs (2.5 mg total) by nebulization every 4 (four) hours as needed for wheezing or shortness of breath. DX: 496 180 mL 12   albuterol (VENTOLIN HFA) 108 (90 Base) MCG/ACT inhaler Inhale 2 puffs every 4-6 hors as needed- rescue 18 g 12   alendronate (FOSAMAX) 70 MG tablet Take 1 tablet (70 mg total) by mouth every 7 (seven) days. Take with a full glass of water on an empty stomach. (Patient taking differently: Take 70 mg by mouth every 7 (seven) days. Take with a full glass of water on an empty stomach. Sunday) 4 tablet 11   aspirin EC 81 MG tablet Take 81 mg by mouth daily.     atorvastatin (LIPITOR) 80 MG tablet TAKE 1 TABLET ONCE DAILY 90 tablet 0   Budeson-Glycopyrrol-Formoterol (BREZTRI AEROSPHERE) 160-9-4.8 MCG/ACT AERO Inhale 2 puffs into the lungs 2 (two) times daily. 10.7 g 5   calcium carbonate (CALTRATE 600) 1500 (600 Ca) MG TABS tablet Take 1 tablet (1,500 mg total) by mouth 2 (two) times daily with a meal. 60 tablet 3   colchicine 0.6 MG tablet TAKE **Note De-Identified [facility]** 1 TABLET ONCE DAILY 30 tablet 0   FLUoxetine (PROZAC) 20 MG capsule Take 1 capsule (20 mg total) by mouth daily. 90 capsule 3   fluticasone (FLONASE) 50 MCG/ACT nasal spray Place 2 sprays into both nostrils daily. 16 g 6   folic acid (FOLVITE) 1 MG tablet Take 1 mg by mouth at bedtime.     lisinopril (ZESTRIL) 20 MG tablet TAKE 1 TABLET BY MOUTH DAILY. 90 tablet 0   montelukast (SINGULAIR) 10 MG tablet TAKE 1 TABLET EVERY DAY (Patient taking differently: Take 10 mg by mouth at bedtime.) 90 tablet 0   Nebulizers (COMPRESSOR/NEBULIZER) MISC Use up to 4 times daily when needed 1 each 0   prochlorperazine (COMPAZINE) 10 MG tablet Take 1 tablet (10 mg total) by mouth every 6 (six) hours as needed for nausea or  vomiting. 30 tablet 0   Respiratory Therapy Supplies (FLUTTER) DEVI Patient to use device for ten minutes, three times daily 1 each 0   sucralfate (CARAFATE) 1 g tablet Take 1 tablet (1 g total) by mouth 4 (four) times daily. Dissolve each tablet in 15 cc water before use. 120 tablet 2   No current facility-administered medications for this visit.    SURGICAL HISTORY:  Past Surgical History:  Procedure Laterality Date   APPENDECTOMY     BRONCHIAL BIOPSY  01/24/2021   Procedure: BRONCHIAL BIOPSIES;  Surgeon: Garner Nash, DO;  Location: [facility];  Service: Pulmonary;;   BRONCHIAL BRUSHINGS  01/24/2021   Procedure: BRONCHIAL BRUSHINGS;  Surgeon: Garner Nash, DO;  Location: [facility];  Service: Pulmonary;;   BRONCHIAL NEEDLE ASPIRATION BIOPSY  01/24/2021   Procedure: BRONCHIAL NEEDLE ASPIRATION BIOPSIES;  Surgeon: Garner Nash, DO;  Location: [facility];  Service: Pulmonary;;   CARPAL TUNNEL RELEASE  10/30/2011   Procedure: CARPAL TUNNEL RELEASE;  Surgeon: Wynonia Sours, MD;  Location: [facility];  Service: Orthopedics;  Laterality: Right;  and mass excision   CHOLECYSTECTOMY     CHOLESTEATOMA EXCISION     right ear   left shoulder spurs     ORIF right lower leg     partial throidectomy     TOTAL ABDOMINAL HYSTERECTOMY     VIDEO BRONCHOSCOPY WITH ENDOBRONCHIAL NAVIGATION N/A 01/24/2021   Procedure: VIDEO BRONCHOSCOPY WITH ENDOBRONCHIAL NAVIGATION;  Surgeon: Garner Nash, DO;  Location: [facility];  Service: Pulmonary;  Laterality: N/A;   VIDEO BRONCHOSCOPY WITH ENDOBRONCHIAL ULTRASOUND N/A 01/24/2021   Procedure: VIDEO BRONCHOSCOPY WITH ENDOBRONCHIAL ULTRASOUND;  Surgeon: Garner Nash, DO;  Location: [facility];  Service: Pulmonary;  Laterality: N/A;    REVIEW OF SYSTEMS:  A comprehensive review of systems was negative except for: Constitutional: positive for fatigue Respiratory: positive for dyspnea on exertion   PHYSICAL EXAMINATION: General  appearance: alert, cooperative, fatigued, and no distress Head: Normocephalic, without obvious abnormality, atraumatic Neck: no adenopathy, no JVD, supple, symmetrical, trachea midline, and thyroid not enlarged, symmetric, no tenderness/mass/nodules Lymph nodes: Cervical, supraclavicular, and axillary nodes normal. Resp: clear to auscultation bilaterally Back: negative, symmetric, no curvature. ROM normal. No CVA tenderness. Cardio: regular rate and rhythm, S1, S2 normal, no murmur, click, rub or gallop GI: soft, non-tender; bowel sounds normal; no masses,  no organomegaly Extremities: edema 1+ edema bilaterally more on the right with darkening of the skin of the shin area and mild tenderness to palpation on the left calf.  ECOG PERFORMANCE STATUS: 1 - Symptomatic but completely ambulatory  Blood pressure (!) 123/46, pulse 95, temperature Marland Kitchen) **Note De-Identified [facility]** 97.5 F (36.4 C), temperature source Tympanic, resp. rate 19, height 5\' 5"  (1.651 m), weight 214 lb 1.6 oz (97.1 kg), SpO2 99 %.  LABORATORY DATA: Lab Results  Component Value Date   WBC 3.2 (L) 03/27/2021   HGB 10.0 (L) 03/27/2021   HCT 32.0 (L) 03/27/2021   MCV 103.6 (H) 03/27/2021   PLT 70 (L) 03/27/2021      Chemistry      Component Value Date/Time   NA 140 03/20/2021 1000   NA 142 08/05/2020 1502   NA 142 08/26/2015 1027   K 4.4 03/20/2021 1000   K 4.3 08/26/2015 1027   CL 100 03/20/2021 1000   CO2 34 (H) 03/20/2021 1000   CO2 34 (H) 08/26/2015 1027   BUN 19 03/20/2021 1000   BUN 14 08/05/2020 1502   BUN 9.6 08/26/2015 1027   CREATININE 1.02 (H) 03/20/2021 1000   CREATININE 0.7 08/26/2015 1027      Component Value Date/Time   CALCIUM 8.7 (L) 03/20/2021 1000   CALCIUM 9.5 08/26/2015 1027   ALKPHOS 140 (H) 03/20/2021 1000   ALKPHOS 131 08/26/2015 1027   AST 38 03/20/2021 1000   AST 15 08/26/2015 1027   ALT 27 03/20/2021 1000   ALT 15 08/26/2015 1027   BILITOT 0.6 03/20/2021 1000   BILITOT 0.66 08/26/2015 1027        RADIOGRAPHIC STUDIES: VAS [place] LOWER EXTREMITY VENOUS (DVT)  Result Date: 03/23/2021  Lower Venous DVT Study Patient Name:  AYANNI TUN Kroeker  Date of Exam:   03/21/2021 Medical Rec #: [id number]        Accession #:    [id number] Date of Birth: Feb 28, 1956         Patient Gender: F Patient Age:   65Y Exam Location:  [facility] Procedure:      VAS [place] LOWER EXTREMITY VENOUS (DVT) Referring Phys: [id number] Nocole Zammit --------------------------------------------------------------------------------  Indications: Pain, and Swelling.  Risk Factors: Cancer. Limitations: Body habitus and poor ultrasound/tissue interface. Comparison Study: No prior studies. Performing Technologist: Oliver Hum RVT  Examination Guidelines: A complete evaluation includes B-mode imaging, spectral Doppler, color Doppler, and power Doppler as needed of all accessible portions of each vessel. Bilateral testing is considered an integral part of a complete examination. Limited examinations for reoccurring indications may be performed as noted. The reflux portion of the exam is performed with the patient in reverse Trendelenburg.  +---------+---------------+---------+-----------+----------+--------------+ RIGHT    CompressibilityPhasicitySpontaneityPropertiesThrombus Aging +---------+---------------+---------+-----------+----------+--------------+ CFV      Full           Yes      Yes                                 +---------+---------------+---------+-----------+----------+--------------+ SFJ      Full                                                        +---------+---------------+---------+-----------+----------+--------------+ FV Prox  Full                                                        +---------+---------------+---------+-----------+----------+--------------+ **Note De-Identified [facility]** FV Mid   Full                                                         +---------+---------------+---------+-----------+----------+--------------+ FV DistalFull                                                        +---------+---------------+---------+-----------+----------+--------------+ PFV      Full                                                        +---------+---------------+---------+-----------+----------+--------------+ POP      Full           Yes      Yes                                 +---------+---------------+---------+-----------+----------+--------------+ PTV      Full                                                        +---------+---------------+---------+-----------+----------+--------------+ PERO     Full                                                        +---------+---------------+---------+-----------+----------+--------------+   +---------+---------------+---------+-----------+----------+--------------+ LEFT     CompressibilityPhasicitySpontaneityPropertiesThrombus Aging +---------+---------------+---------+-----------+----------+--------------+ CFV      Full           Yes      Yes                                 +---------+---------------+---------+-----------+----------+--------------+ SFJ      Full                                                        +---------+---------------+---------+-----------+----------+--------------+ FV Prox  Full                                                        +---------+---------------+---------+-----------+----------+--------------+ FV Mid   Full                                                        +---------+---------------+---------+-----------+----------+--------------+ **Note De-Identified [facility]** FV Distal               Yes      Yes                                 +---------+---------------+---------+-----------+----------+--------------+ PFV      Full                                                         +---------+---------------+---------+-----------+----------+--------------+ POP      Full           Yes      Yes                                 +---------+---------------+---------+-----------+----------+--------------+ PTV      Full                                                        +---------+---------------+---------+-----------+----------+--------------+ PERO     Full                                                        +---------+---------------+---------+-----------+----------+--------------+     Summary: RIGHT: - There is no evidence of deep vein thrombosis in the lower extremity. However, portions of this examination were limited- see technologist comments above.  - No cystic structure found in the popliteal fossa.  LEFT: - There is no evidence of deep vein thrombosis in the lower extremity. However, portions of this examination were limited- see technologist comments above.  - No cystic structure found in the popliteal fossa.  *See table(s) above for measurements and observations. Electronically signed by Jamelle Haring on 03/23/2021 at 12:35:58 PM.    Final     ASSESSMENT AND PLAN: This is a very pleasant 65 years old white female recently diagnosed with unresectable stage IIb (T1c, N1, M0) non-small cell lung cancer, squamous cell carcinoma diagnosed in April 2022 and presented with left upper lobe lung nodule in addition to left hilar adenopathy. The patient is currently undergoing a course of concurrent chemoradiation with weekly carboplatin for AUC of 2 and paclitaxel 45 Mg/M2 status post 5 cycles.   The patient continues to tolerate her treatment well with no concerning adverse effect except for mild nausea. I recommended for her to proceed with cycle #6 today as planned. She is expected to complete the course of her concurrent chemoradiation next week. I will see her back for follow-up visit in 2 weeks for evaluation and consideration of ordering imaging studies for  restaging of her disease. For the swelling of the lower extremities, the patient underwent Doppler of the lower extremities that showed no evidence for deep venous thrombosis. She was advised to call immediately if she has any concerning symptoms in the interval. The patient voices understanding of current disease status and treatment options and is in agreement with the **Note De-Identified [facility]** current care plan.  All questions were answered. The patient knows to call the clinic with any problems, questions or concerns. We can certainly see the patient much sooner if necessary.  Disclaimer: This note was dictated with voice recognition software. Similar sounding words can inadvertently be transcribed and may not be corrected upon review.

## 2021-03-28 ENCOUNTER — Ambulatory Visit
Admission: RE | Admit: 2021-03-28 | Discharge: 2021-03-28 | Disposition: A | Discharge status: Home / Self Care | Payer: Medicare Other | Source: Ambulatory Visit | Attending: Radiation Oncology | Admitting: Radiation Oncology

## 2021-03-28 ENCOUNTER — Telehealth: Payer: Self-pay | Admitting: Internal Medicine

## 2021-03-28 ENCOUNTER — Other Ambulatory Visit: Payer: Self-pay

## 2021-03-28 DIAGNOSIS — Z510 Encounter for antineoplastic radiation therapy: Secondary | ICD-10-CM | POA: Diagnosis not present

## 2021-03-28 DIAGNOSIS — C3412 Malignant neoplasm of upper lobe, left bronchus or lung: Secondary | ICD-10-CM | POA: Diagnosis not present

## 2021-03-28 NOTE — Telephone Encounter (Signed)
**Note De-identified [facility]** Scheduled per los. Called and left msg. Mailed printout  °

## 2021-03-29 ENCOUNTER — Ambulatory Visit
Admission: RE | Admit: 2021-03-29 | Discharge: 2021-03-29 | Disposition: A | Discharge status: Home / Self Care | Payer: Medicare Other | Source: Ambulatory Visit | Attending: Radiation Oncology | Admitting: Radiation Oncology

## 2021-03-29 DIAGNOSIS — Z510 Encounter for antineoplastic radiation therapy: Secondary | ICD-10-CM | POA: Diagnosis not present

## 2021-03-29 DIAGNOSIS — C3412 Malignant neoplasm of upper lobe, left bronchus or lung: Secondary | ICD-10-CM | POA: Diagnosis not present

## 2021-03-30 ENCOUNTER — Ambulatory Visit
Admission: RE | Admit: 2021-03-30 | Discharge: 2021-03-30 | Disposition: A | Discharge status: Home / Self Care | Payer: Medicare Other | Source: Ambulatory Visit | Attending: Radiation Oncology | Admitting: Radiation Oncology

## 2021-03-30 ENCOUNTER — Other Ambulatory Visit: Payer: Self-pay

## 2021-03-30 DIAGNOSIS — Z510 Encounter for antineoplastic radiation therapy: Secondary | ICD-10-CM | POA: Diagnosis not present

## 2021-03-30 DIAGNOSIS — C3412 Malignant neoplasm of upper lobe, left bronchus or lung: Secondary | ICD-10-CM | POA: Diagnosis not present

## 2021-03-31 ENCOUNTER — Other Ambulatory Visit: Payer: Self-pay | Admitting: Physician Assistant

## 2021-03-31 ENCOUNTER — Ambulatory Visit
Admission: RE | Admit: 2021-03-31 | Discharge: 2021-03-31 | Disposition: A | Discharge status: Home / Self Care | Payer: Medicare Other | Source: Ambulatory Visit | Attending: Radiation Oncology | Admitting: Radiation Oncology

## 2021-03-31 DIAGNOSIS — Z510 Encounter for antineoplastic radiation therapy: Secondary | ICD-10-CM | POA: Diagnosis not present

## 2021-03-31 DIAGNOSIS — C3412 Malignant neoplasm of upper lobe, left bronchus or lung: Secondary | ICD-10-CM | POA: Diagnosis not present

## 2021-03-31 MED FILL — Dexamethasone Sodium Phosphate Inj 100 MG/10ML: INTRAMUSCULAR | Qty: 2 | Status: AC

## 2021-03-31 MED FILL — Dexamethasone Sodium Phosphate Inj 100 MG/10ML: INTRAMUSCULAR | Qty: 2 | Status: CN

## 2021-04-04 ENCOUNTER — Ambulatory Visit: Payer: Medicare Other | Admitting: Physician Assistant

## 2021-04-04 ENCOUNTER — Ambulatory Visit: Payer: Medicare Other

## 2021-04-04 ENCOUNTER — Inpatient Hospital Stay: Payer: Medicare Other

## 2021-04-04 ENCOUNTER — Inpatient Hospital Stay: Payer: Medicare Other | Attending: Internal Medicine

## 2021-04-04 ENCOUNTER — Other Ambulatory Visit: Payer: Medicare Other

## 2021-04-04 ENCOUNTER — Ambulatory Visit
Admission: RE | Admit: 2021-04-04 | Discharge: 2021-04-04 | Disposition: A | Discharge status: Home / Self Care | Payer: Medicare Other | Source: Ambulatory Visit | Attending: Radiation Oncology | Admitting: Radiation Oncology

## 2021-04-04 ENCOUNTER — Other Ambulatory Visit: Payer: Self-pay

## 2021-04-04 DIAGNOSIS — Z923 Personal history of irradiation: Secondary | ICD-10-CM | POA: Diagnosis not present

## 2021-04-04 DIAGNOSIS — C3412 Malignant neoplasm of upper lobe, left bronchus or lung: Secondary | ICD-10-CM | POA: Insufficient documentation

## 2021-04-04 DIAGNOSIS — F17200 Nicotine dependence, unspecified, uncomplicated: Secondary | ICD-10-CM | POA: Diagnosis not present

## 2021-04-04 DIAGNOSIS — R042 Hemoptysis: Secondary | ICD-10-CM | POA: Insufficient documentation

## 2021-04-04 DIAGNOSIS — Z510 Encounter for antineoplastic radiation therapy: Secondary | ICD-10-CM | POA: Diagnosis not present

## 2021-04-04 LAB — CBC WITH DIFFERENTIAL (CANCER CENTER ONLY)
Abs Immature Granulocytes: 0.01 10*3/uL (ref 0.00–0.07)
Basophils Absolute: 0 10*3/uL (ref 0.0–0.1)
Basophils Relative: 0 %
Eosinophils Absolute: 0.1 10*3/uL (ref 0.0–0.5)
Eosinophils Relative: 2 %
HCT: 32.9 % — ABNORMAL LOW (ref 36.0–46.0)
Hemoglobin: 10.5 g/dL — ABNORMAL LOW (ref 12.0–15.0)
Immature Granulocytes: 0 %
Lymphocytes Relative: 17 %
Lymphs Abs: 0.7 10*3/uL (ref 0.7–4.0)
MCH: 32.8 pg (ref 26.0–34.0)
MCHC: 31.9 g/dL (ref 30.0–36.0)
MCV: 102.8 fL — ABNORMAL HIGH (ref 80.0–100.0)
Monocytes Absolute: 0.5 10*3/uL (ref 0.1–1.0)
Monocytes Relative: 13 %
Neutro Abs: 2.5 10*3/uL (ref 1.7–7.7)
Neutrophils Relative %: 68 %
Platelet Count: 66 10*3/uL — ABNORMAL LOW (ref 150–400)
RBC: 3.2 MIL/uL — ABNORMAL LOW (ref 3.87–5.11)
RDW: 18.8 % — ABNORMAL HIGH (ref 11.5–15.5)
WBC Count: 3.7 10*3/uL — ABNORMAL LOW (ref 4.0–10.5)
nRBC: 0 % (ref 0.0–0.2)

## 2021-04-04 LAB — CMP (CANCER CENTER ONLY)
ALT: 18 U/L (ref 0–44)
AST: 25 U/L (ref 15–41)
Albumin: 3 g/dL — ABNORMAL LOW (ref 3.5–5.0)
Alkaline Phosphatase: 158 U/L — ABNORMAL HIGH (ref 38–126)
Anion gap: 6 (ref 5–15)
BUN: 12 mg/dL (ref 8–23)
CO2: 33 mmol/L — ABNORMAL HIGH (ref 22–32)
Calcium: 8.8 mg/dL — ABNORMAL LOW (ref 8.9–10.3)
Chloride: 101 mmol/L (ref 98–111)
Creatinine: 0.8 mg/dL (ref 0.44–1.00)
GFR, Estimated: >60 mL/min (ref >60)
Glucose, Bld: 89 mg/dL (ref 70–99)
Potassium: 4.4 mmol/L (ref 3.5–5.1)
Sodium: 140 mmol/L (ref 135–145)
Total Bilirubin: 0.7 mg/dL (ref 0.3–1.2)
Total Protein: 6.8 g/dL (ref 6.5–8.1)

## 2021-04-05 ENCOUNTER — Inpatient Hospital Stay: Payer: Medicare Other

## 2021-04-05 ENCOUNTER — Ambulatory Visit
Admission: RE | Admit: 2021-04-05 | Discharge: 2021-04-05 | Disposition: A | Discharge status: Home / Self Care | Payer: Medicare Other | Source: Ambulatory Visit | Attending: Radiation Oncology | Admitting: Radiation Oncology

## 2021-04-05 ENCOUNTER — Other Ambulatory Visit: Payer: Self-pay

## 2021-04-05 ENCOUNTER — Ambulatory Visit: Payer: Medicare Other

## 2021-04-05 ENCOUNTER — Other Ambulatory Visit: Payer: Self-pay | Admitting: Family Medicine

## 2021-04-05 DIAGNOSIS — J410 Simple chronic bronchitis: Secondary | ICD-10-CM

## 2021-04-05 DIAGNOSIS — C3412 Malignant neoplasm of upper lobe, left bronchus or lung: Secondary | ICD-10-CM | POA: Diagnosis not present

## 2021-04-05 DIAGNOSIS — J9611 Chronic respiratory failure with hypoxia: Secondary | ICD-10-CM

## 2021-04-05 DIAGNOSIS — Z510 Encounter for antineoplastic radiation therapy: Secondary | ICD-10-CM | POA: Diagnosis not present

## 2021-04-05 NOTE — Progress Notes (Signed)
**Note De-Identified [facility]** Per Dr Julien Nordmann no treatment today with plt count of 66. Patient was notified and husband was called for pick up.

## 2021-04-06 ENCOUNTER — Ambulatory Visit: Payer: Medicare Other

## 2021-04-06 ENCOUNTER — Ambulatory Visit
Admission: RE | Admit: 2021-04-06 | Discharge: 2021-04-06 | Disposition: A | Discharge status: Home / Self Care | Payer: Medicare Other | Source: Ambulatory Visit | Attending: Radiation Oncology | Admitting: Radiation Oncology

## 2021-04-06 DIAGNOSIS — Z510 Encounter for antineoplastic radiation therapy: Secondary | ICD-10-CM | POA: Diagnosis not present

## 2021-04-06 DIAGNOSIS — C3412 Malignant neoplasm of upper lobe, left bronchus or lung: Secondary | ICD-10-CM | POA: Diagnosis not present

## 2021-04-07 ENCOUNTER — Ambulatory Visit
Admission: RE | Admit: 2021-04-07 | Discharge: 2021-04-07 | Disposition: A | Discharge status: Home / Self Care | Payer: Medicare Other | Source: Ambulatory Visit | Attending: Radiation Oncology | Admitting: Radiation Oncology

## 2021-04-07 ENCOUNTER — Other Ambulatory Visit: Payer: Self-pay

## 2021-04-07 ENCOUNTER — Ambulatory Visit: Payer: Medicare Other

## 2021-04-07 DIAGNOSIS — Z510 Encounter for antineoplastic radiation therapy: Secondary | ICD-10-CM | POA: Diagnosis not present

## 2021-04-07 DIAGNOSIS — C3412 Malignant neoplasm of upper lobe, left bronchus or lung: Secondary | ICD-10-CM | POA: Diagnosis not present

## 2021-04-10 ENCOUNTER — Other Ambulatory Visit: Payer: Self-pay | Admitting: Medical Oncology

## 2021-04-10 ENCOUNTER — Other Ambulatory Visit: Payer: Self-pay

## 2021-04-10 ENCOUNTER — Ambulatory Visit
Admission: RE | Admit: 2021-04-10 | Discharge: 2021-04-10 | Disposition: A | Discharge status: Home / Self Care | Payer: Medicare Other | Source: Ambulatory Visit | Attending: Radiation Oncology | Admitting: Radiation Oncology

## 2021-04-10 ENCOUNTER — Encounter: Payer: Self-pay | Admitting: Radiation Oncology

## 2021-04-10 DIAGNOSIS — Z510 Encounter for antineoplastic radiation therapy: Secondary | ICD-10-CM | POA: Diagnosis not present

## 2021-04-10 DIAGNOSIS — L400 Psoriasis vulgaris: Secondary | ICD-10-CM | POA: Diagnosis not present

## 2021-04-10 DIAGNOSIS — T85511A Breakdown (mechanical) of esophageal anti-reflux device, initial encounter: Secondary | ICD-10-CM | POA: Diagnosis not present

## 2021-04-10 DIAGNOSIS — G4733 Obstructive sleep apnea (adult) (pediatric): Secondary | ICD-10-CM | POA: Diagnosis not present

## 2021-04-10 DIAGNOSIS — C3412 Malignant neoplasm of upper lobe, left bronchus or lung: Secondary | ICD-10-CM | POA: Diagnosis not present

## 2021-04-10 DIAGNOSIS — J449 Chronic obstructive pulmonary disease, unspecified: Secondary | ICD-10-CM | POA: Diagnosis not present

## 2021-04-11 ENCOUNTER — Inpatient Hospital Stay (HOSPITAL_BASED_OUTPATIENT_CLINIC_OR_DEPARTMENT_OTHER): Payer: Medicare Other | Admitting: Internal Medicine

## 2021-04-11 ENCOUNTER — Inpatient Hospital Stay: Payer: Medicare Other

## 2021-04-11 ENCOUNTER — Encounter: Payer: Self-pay | Admitting: Internal Medicine

## 2021-04-11 VITALS — BP 138/66 | HR 96 | Temp 98.4°F | Resp 18 | Ht 65.0 in | Wt 206.9 lb

## 2021-04-11 DIAGNOSIS — Z923 Personal history of irradiation: Secondary | ICD-10-CM | POA: Diagnosis not present

## 2021-04-11 DIAGNOSIS — R042 Hemoptysis: Secondary | ICD-10-CM | POA: Diagnosis not present

## 2021-04-11 DIAGNOSIS — C3412 Malignant neoplasm of upper lobe, left bronchus or lung: Secondary | ICD-10-CM

## 2021-04-11 DIAGNOSIS — C3490 Malignant neoplasm of unspecified part of unspecified bronchus or lung: Secondary | ICD-10-CM | POA: Diagnosis not present

## 2021-04-11 DIAGNOSIS — Z5111 Encounter for antineoplastic chemotherapy: Secondary | ICD-10-CM

## 2021-04-11 DIAGNOSIS — F17200 Nicotine dependence, unspecified, uncomplicated: Secondary | ICD-10-CM | POA: Diagnosis not present

## 2021-04-11 LAB — CBC WITH DIFFERENTIAL (CANCER CENTER ONLY)
Abs Immature Granulocytes: 0.01 10*3/uL (ref 0.00–0.07)
Basophils Absolute: 0 10*3/uL (ref 0.0–0.1)
Basophils Relative: 0 %
Eosinophils Absolute: 0.1 10*3/uL (ref 0.0–0.5)
Eosinophils Relative: 2 %
HCT: 35.2 % — ABNORMAL LOW (ref 36.0–46.0)
Hemoglobin: 11.2 g/dL — ABNORMAL LOW (ref 12.0–15.0)
Immature Granulocytes: 0 %
Lymphocytes Relative: 16 %
Lymphs Abs: 0.6 10*3/uL — ABNORMAL LOW (ref 0.7–4.0)
MCH: 33.2 pg (ref 26.0–34.0)
MCHC: 31.8 g/dL (ref 30.0–36.0)
MCV: 104.5 fL — ABNORMAL HIGH (ref 80.0–100.0)
Monocytes Absolute: 0.4 10*3/uL (ref 0.1–1.0)
Monocytes Relative: 11 %
Neutro Abs: 2.7 10*3/uL (ref 1.7–7.7)
Neutrophils Relative %: 71 %
Platelet Count: 72 10*3/uL — ABNORMAL LOW (ref 150–400)
RBC: 3.37 MIL/uL — ABNORMAL LOW (ref 3.87–5.11)
RDW: 18.4 % — ABNORMAL HIGH (ref 11.5–15.5)
WBC Count: 3.9 10*3/uL — ABNORMAL LOW (ref 4.0–10.5)
nRBC: 0 % (ref 0.0–0.2)

## 2021-04-11 LAB — CMP (CANCER CENTER ONLY)
ALT: 19 U/L (ref 0–44)
AST: 31 U/L (ref 15–41)
Albumin: 3.1 g/dL — ABNORMAL LOW (ref 3.5–5.0)
Alkaline Phosphatase: 166 U/L — ABNORMAL HIGH (ref 38–126)
Anion gap: 9 (ref 5–15)
BUN: 11 mg/dL (ref 8–23)
CO2: 32 mmol/L (ref 22–32)
Calcium: 9.4 mg/dL (ref 8.9–10.3)
Chloride: 101 mmol/L (ref 98–111)
Creatinine: 0.84 mg/dL (ref 0.44–1.00)
GFR, Estimated: >60 mL/min (ref >60)
Glucose, Bld: 130 mg/dL — ABNORMAL HIGH (ref 70–99)
Potassium: 4.4 mmol/L (ref 3.5–5.1)
Sodium: 142 mmol/L (ref 135–145)
Total Bilirubin: 0.8 mg/dL (ref 0.3–1.2)
Total Protein: 7.4 g/dL (ref 6.5–8.1)

## 2021-04-11 NOTE — Progress Notes (Signed)
**Note De-Identified Hailey Hernandez** [place] [phone]   [phone]) [phone]  OFFICE PROGRESS NOTE  Hailey Norlander, DO [place] [address]  DIAGNOSIS: Stage IIb (T1c, N1, M0) non-small cell lung cancer, squamous cell carcinoma presented with left upper lobe lung nodule in addition to left hilar adenopathy diagnosed in April 2022.   PRIOR THERAPY: Concurrent chemoradiation with weekly carboplatin for AUC of 2 and paclitaxel 45 Mg/M2 for 6-7 weeks. First dose on 02/21/21. Status post 6 cycles.  Last dose was given on March 27, 2021.   CURRENT THERAPY: None  INTERVAL HISTORY: Hailey Hernandez 65 y.o. female returns to the clinic today for follow-up visit accompanied by her husband.  The patient is feeling fine today with no concerning complaints except for mild fatigue and discomfort on the left side of the chest close to the radiation port.  She also had 1 episode of coughing blood-tinged sputum but this resolved.  She continues to smoke 1.5 pack/day and I strongly encouraged her to quit smoking.  She denied having any current shortness of breath or hemoptysis.  She denied having any fever or chills.  She has no nausea, vomiting, diarrhea or constipation.  She lost around 7 pounds in the last 2 weeks because of lack of appetite.  She has no headache or visual changes.  She completed her radiotherapy yesterday.   MEDICAL HISTORY: Past Medical History:  Diagnosis Date   Allergy    Anxiety disorder    Arthritis    bil legs   Asthma    Cancer ([place])    Chronic bronchitis    Complication of anesthesia    slow to wake up, one time she turned blue in recovery   COPD (chronic obstructive pulmonary disease) (HCC)    Depression    Diverticulitis    DJD (degenerative joint disease)    spine   Dyspnea    O2 at 3 L 24/7   Fibromyalgia    GERD (gastroesophageal reflux disease)    Glaucoma    History of thyroid cancer    HTN (hypertension)    Hyperlipidemia    Impaired  glucose tolerance 05/08/2013   OSA (obstructive sleep apnea)    CPAP  last sleep study 8-10 yr.ag0   Polymyalgia ([place])    Pre-diabetes    Psoriasis    Rhinitis    Tobacco abuse     ALLERGIES:  is allergic to doxycycline, tramadol, milnacipran, and apremilast.  MEDICATIONS:  Current Outpatient Medications  Medication Sig Dispense Refill   albuterol (PROVENTIL) (2.5 MG/3ML) 0.083% nebulizer solution Take 3 mLs (2.5 mg total) by nebulization every 4 (four) hours as needed for wheezing or shortness of breath. DX: 496 180 mL 12   albuterol (VENTOLIN HFA) 108 (90 Base) MCG/ACT inhaler Inhale 2 puffs every 4-6 hors as needed- rescue 18 g 12   alendronate (FOSAMAX) 70 MG tablet Take 1 tablet (70 mg total) by mouth every 7 (seven) days. Take with a full glass of water on an empty stomach. (Patient taking differently: Take 70 mg by mouth every 7 (seven) days. Take with a full glass of water on an empty stomach. Sunday) 4 tablet 11   aspirin EC 81 MG tablet Take 81 mg by mouth daily.     atorvastatin (LIPITOR) 80 MG tablet TAKE 1 TABLET ONCE DAILY 90 tablet 0   Budeson-Glycopyrrol-Formoterol (BREZTRI AEROSPHERE) 160-9-4.8 MCG/ACT AERO Inhale 2 puffs into the lungs 2 (two) times daily. 10.7 g **Note De-Identified Belote Hernandez** 5   calcium carbonate (CALTRATE 600) 1500 (600 Ca) MG TABS tablet Take 1 tablet (1,500 mg total) by mouth 2 (two) times daily with a meal. 60 tablet 3   colchicine 0.6 MG tablet TAKE 1 TABLET ONCE DAILY 30 tablet 0   FLUoxetine (PROZAC) 20 MG capsule Take 1 capsule (20 mg total) by mouth daily. 90 capsule 3   fluticasone (FLONASE) 50 MCG/ACT nasal spray Place 2 sprays into both nostrils daily. 16 g 6   lisinopril (ZESTRIL) 20 MG tablet TAKE 1 TABLET BY MOUTH DAILY. 90 tablet 0   montelukast (SINGULAIR) 10 MG tablet TAKE 1 TABLET EVERY DAY 90 tablet 0   Nebulizers (COMPRESSOR/NEBULIZER) MISC Use up to 4 times daily when needed 1 each 0   prochlorperazine (COMPAZINE) 10 MG tablet Take 1 tablet (10 mg total) by  mouth every 6 (six) hours as needed for nausea or vomiting. 30 tablet 0   Respiratory Therapy Supplies (FLUTTER) DEVI Patient to use device for ten minutes, three times daily 1 each 0   sucralfate (CARAFATE) 1 g tablet Take 1 tablet (1 g total) by mouth 4 (four) times daily. Dissolve each tablet in 15 cc water before use. 120 tablet 2   No current facility-administered medications for this visit.    SURGICAL HISTORY:  Past Surgical History:  Procedure Laterality Date   APPENDECTOMY     BRONCHIAL BIOPSY  01/24/2021   Procedure: BRONCHIAL BIOPSIES;  Surgeon: Garner Nash, DO;  Location: [facility];  Service: Pulmonary;;   BRONCHIAL BRUSHINGS  01/24/2021   Procedure: BRONCHIAL BRUSHINGS;  Surgeon: Garner Nash, DO;  Location: [facility];  Service: Pulmonary;;   BRONCHIAL NEEDLE ASPIRATION BIOPSY  01/24/2021   Procedure: BRONCHIAL NEEDLE ASPIRATION BIOPSIES;  Surgeon: Garner Nash, DO;  Location: [facility];  Service: Pulmonary;;   CARPAL TUNNEL RELEASE  10/30/2011   Procedure: CARPAL TUNNEL RELEASE;  Surgeon: Wynonia Sours, MD;  Location: [facility];  Service: Orthopedics;  Laterality: Right;  and mass excision   CHOLECYSTECTOMY     CHOLESTEATOMA EXCISION     right ear   left shoulder spurs     ORIF right lower leg     partial throidectomy     TOTAL ABDOMINAL HYSTERECTOMY     VIDEO BRONCHOSCOPY WITH ENDOBRONCHIAL NAVIGATION N/A 01/24/2021   Procedure: VIDEO BRONCHOSCOPY WITH ENDOBRONCHIAL NAVIGATION;  Surgeon: Garner Nash, DO;  Location: [facility];  Service: Pulmonary;  Laterality: N/A;   VIDEO BRONCHOSCOPY WITH ENDOBRONCHIAL ULTRASOUND N/A 01/24/2021   Procedure: VIDEO BRONCHOSCOPY WITH ENDOBRONCHIAL ULTRASOUND;  Surgeon: Garner Nash, DO;  Location: [facility];  Service: Pulmonary;  Laterality: N/A;    REVIEW OF SYSTEMS:  A comprehensive review of systems was negative except for: Constitutional: positive for anorexia, fatigue, and weight  loss Respiratory: positive for dyspnea on exertion and pleurisy/chest pain Integument/breast: positive for significant skin rash all over the body mainly on the upper extremities secondary to psoriasis    PHYSICAL EXAMINATION: General appearance: alert, cooperative, fatigued, and no distress Head: Normocephalic, without obvious abnormality, atraumatic Neck: no adenopathy, no JVD, supple, symmetrical, trachea midline, and thyroid not enlarged, symmetric, no tenderness/mass/nodules Lymph nodes: Cervical, supraclavicular, and axillary nodes normal. Resp: clear to auscultation bilaterally Back: negative, symmetric, no curvature. ROM normal. No CVA tenderness. Cardio: regular rate and rhythm, S1, S2 normal, no murmur, click, rub or gallop GI: soft, non-tender; bowel sounds normal; no masses,  no organomegaly Extremities: extremities normal, atraumatic, no cyanosis or edemaand mild **Note De-Identified Brimage Hernandez** tenderness to palpation on the left calf.  ECOG PERFORMANCE STATUS: 1 - Symptomatic but completely ambulatory  Blood pressure 138/66, pulse 96, temperature 98.4 F (36.9 C), temperature source Oral, resp. rate 18, height 5\' 5"  (1.651 m), weight 206 lb 14.4 oz (93.8 kg), SpO2 99 %.  LABORATORY DATA: Lab Results  Component Value Date   WBC 3.9 (L) 04/11/2021   HGB 11.2 (L) 04/11/2021   HCT 35.2 (L) 04/11/2021   MCV 104.5 (H) 04/11/2021   PLT 72 (L) 04/11/2021      Chemistry      Component Value Date/Time   NA 140 04/04/2021 1453   NA 142 08/05/2020 1502   NA 142 08/26/2015 1027   K 4.4 04/04/2021 1453   K 4.3 08/26/2015 1027   CL 101 04/04/2021 1453   CO2 33 (H) 04/04/2021 1453   CO2 34 (H) 08/26/2015 1027   BUN 12 04/04/2021 1453   BUN 14 08/05/2020 1502   BUN 9.6 08/26/2015 1027   CREATININE 0.80 04/04/2021 1453   CREATININE 0.7 08/26/2015 1027      Component Value Date/Time   CALCIUM 8.8 (L) 04/04/2021 1453   CALCIUM 9.5 08/26/2015 1027   ALKPHOS 158 (H) 04/04/2021 1453   ALKPHOS 131 08/26/2015  1027   AST 25 04/04/2021 1453   AST 15 08/26/2015 1027   ALT 18 04/04/2021 1453   ALT 15 08/26/2015 1027   BILITOT 0.7 04/04/2021 1453   BILITOT 0.66 08/26/2015 1027       RADIOGRAPHIC STUDIES: VAS [place] LOWER EXTREMITY VENOUS (DVT)  Result Date: 03/23/2021  Lower Venous DVT Study Patient Name:  ESTHER [address] Benkert  Date of Exam:   03/21/2021 Medical Rec #: [id number]        Accession #:    [id number] Date of Birth: [birth year]         Patient Gender: F Patient Age:   65Y Exam Location:  [facility] Procedure:      VAS [place] LOWER EXTREMITY VENOUS (DVT) Referring Phys: [id number] [facility] Alphonso Gregson --------------------------------------------------------------------------------  Indications: Pain, and Swelling.  Risk Factors: Cancer. Limitations: Body habitus and poor ultrasound/tissue interface. Comparison Study: No prior studies. Performing Technologist: Oliver Hum RVT  Examination Guidelines: A complete evaluation includes B-mode imaging, spectral Doppler, color Doppler, and power Doppler as needed of all accessible portions of each vessel. Bilateral testing is considered an integral part of a complete examination. Limited examinations for reoccurring indications may be performed as noted. The reflux portion of the exam is performed with the patient in reverse Trendelenburg.  +---------+---------------+---------+-----------+----------+--------------+ RIGHT    CompressibilityPhasicitySpontaneityPropertiesThrombus Aging +---------+---------------+---------+-----------+----------+--------------+ CFV      Full           Yes      Yes                                 +---------+---------------+---------+-----------+----------+--------------+ SFJ      Full                                                        +---------+---------------+---------+-----------+----------+--------------+ FV Prox  Full                                                         +---------+---------------+---------+-----------+----------+--------------+ **Note De-Identified Driggers Hernandez** FV Mid   Full                                                        +---------+---------------+---------+-----------+----------+--------------+ FV DistalFull                                                        +---------+---------------+---------+-----------+----------+--------------+ PFV      Full                                                        +---------+---------------+---------+-----------+----------+--------------+ POP      Full           Yes      Yes                                 +---------+---------------+---------+-----------+----------+--------------+ PTV      Full                                                        +---------+---------------+---------+-----------+----------+--------------+ PERO     Full                                                        +---------+---------------+---------+-----------+----------+--------------+   +---------+---------------+---------+-----------+----------+--------------+ LEFT     CompressibilityPhasicitySpontaneityPropertiesThrombus Aging +---------+---------------+---------+-----------+----------+--------------+ CFV      Full           Yes      Yes                                 +---------+---------------+---------+-----------+----------+--------------+ SFJ      Full                                                        +---------+---------------+---------+-----------+----------+--------------+ FV Prox  Full                                                        +---------+---------------+---------+-----------+----------+--------------+ FV Mid   Full                                                        +---------+---------------+---------+-----------+----------+--------------+ **Note De-Identified Heathman Hernandez** FV Distal               Yes      Yes                                  +---------+---------------+---------+-----------+----------+--------------+ PFV      Full                                                        +---------+---------------+---------+-----------+----------+--------------+ POP      Full           Yes      Yes                                 +---------+---------------+---------+-----------+----------+--------------+ PTV      Full                                                        +---------+---------------+---------+-----------+----------+--------------+ PERO     Full                                                        +---------+---------------+---------+-----------+----------+--------------+     Summary: RIGHT: - There is no evidence of deep vein thrombosis in the lower extremity. However, portions of this examination were limited- see technologist comments above.  - No cystic structure found in the popliteal fossa.  LEFT: - There is no evidence of deep vein thrombosis in the lower extremity. However, portions of this examination were limited- see technologist comments above.  - No cystic structure found in the popliteal fossa.  *See table(s) above for measurements and observations. Electronically signed by Jamelle Haring on 03/23/2021 at 12:35:58 PM.    Final     ASSESSMENT AND PLAN: This is a very pleasant 65 years old white female recently diagnosed with unresectable stage IIb (T1c, N1, M0) non-small cell lung cancer, squamous cell carcinoma diagnosed in April 2022 and presented with left upper lobe lung nodule in addition to left hilar adenopathy. The patient completed a course of concurrent chemoradiation with weekly carboplatin for AUC of 2 and paclitaxel 45 Mg/M2 status post 6 cycles.   The patient tolerated this course fairly well except for mild fatigue and weight loss. I recommended for her to have repeat CT scan of the chest before her next follow-up visit with me in 3 weeks for restaging of her disease. For the  smoking cessation I strongly encouraged her to quit smoking. For the intermittent hemoptysis, she was advised to hold her aspirin if she has any further bleeding and go immediately to the emergency department if significant. The patient was advised to call immediately if she has any other concerning symptoms in the interval. The patient voices understanding of current disease status and treatment options and is in agreement with the current care plan.  All questions were answered. The patient knows **Note De-Identified Abend Hernandez** to call the clinic with any problems, questions or concerns. We can certainly see the patient much sooner if necessary.  Disclaimer: This note was dictated with voice recognition software. Similar sounding words can inadvertently be transcribed and may not be corrected upon review.

## 2021-04-13 ENCOUNTER — Encounter: Payer: Self-pay | Admitting: Hematology

## 2021-04-13 NOTE — Progress Notes (Signed)
**Note De-identified [facility]**  **Note De-Identified [place] [facility]** Radiation Oncology         [phone] ________________________________  Name: Hailey Hernandez MRN: [id number]  Date: 03/21/2021  DOB: 04-17-1956  SIMULATION NOTE   NARRATIVE:  The patient underwent simulation today for ongoing radiation therapy.  The existing CT study set was employed for the purpose of virtual treatment planning.  The target and avoidance structures were reviewed and modified as necessary.  Treatment planning then occurred.  The radiation boost prescription was entered and confirmed.  A total of 4 complex treatment devices were fabricated in the form of multi-leaf collimators to shape radiation around the targets while maximally excluding nearby normal structures. I have requested : Isodose Plan.    PLAN:  This modified radiation beam arrangement is intended to continue the current radiation dose to an additional 6 Gy in 3 fractions for a total cumulative dose of 66 Gy.    ------------------------------------------------  Jodelle Gross, MD, PhD

## 2021-04-19 ENCOUNTER — Ambulatory Visit (INDEPENDENT_AMBULATORY_CARE_PROVIDER_SITE_OTHER): Payer: Medicare Other | Admitting: Nurse Practitioner

## 2021-04-19 ENCOUNTER — Encounter: Payer: Self-pay | Admitting: Nurse Practitioner

## 2021-04-19 ENCOUNTER — Other Ambulatory Visit: Payer: Self-pay

## 2021-04-19 VITALS — BP 128/59 | HR 99 | Temp 98.3°F | Ht 65.0 in | Wt 203.0 lb

## 2021-04-19 DIAGNOSIS — M79604 Pain in right leg: Secondary | ICD-10-CM

## 2021-04-19 DIAGNOSIS — M79605 Pain in left leg: Secondary | ICD-10-CM

## 2021-04-19 NOTE — Patient Instructions (Signed)
**Note De-Identified [facility]** Leg Cramps Leg cramps occur when one or more muscles tighten and a person has no control over it (involuntary muscle contraction). Muscle cramps are most common in the calf muscles of the leg. They can occur during exercise or at rest. Leg cramps are painful, and they may last for a few seconds to a few minutes. Cramps may return several times before they finallystop. Usually, leg cramps are not caused by a serious medical problem. In many cases, the cause is not known. Some common causes include: Excessive physical effort (overexertion), such as during intense exercise. Doing the same motion over and over. Staying in a certain position for a long period of time. Improper preparation, form, or technique while doing a sport or an activity. Dehydration. Injury. Side effects of certain medicines. Abnormally low levels of minerals in your blood (electrolytes), especially potassium and calcium. This could result from: Pregnancy. Taking diuretic medicines. Follow these instructions at home: Eating and drinking Drink enough fluid to keep your urine pale yellow. Staying hydrated may help prevent cramps. Eat a healthy diet that includes plenty of nutrients to help your muscles function. A healthy diet includes fruits and vegetables, lean protein, whole grains, and low-fat or nonfat dairy products. Managing pain, stiffness, and swelling     Try massaging, stretching, and relaxing the affected muscle. Do this for several minutes at a time. If directed, put ice on areas that are sore or painful after a cramp. To do this: Put ice in a plastic bag. Place a towel between your skin and the bag. Leave the ice on for 20 minutes, 2-3 times a day. Remove the ice if your skin turns bright red. This is very important. If you cannot feel pain, heat, or cold, you have a greater risk of damage to the area. If directed, apply heat to muscles that are tense or tight. Do this before you exercise, or as often as told  by your health care provider. Use the heat source that your health care provider recommends, such as a moist heat pack or a heating pad. To do this: Place a towel between your skin and the heat source. Leave the heat on for 20-30 minutes. Remove the heat if your skin turns bright red. This is especially important if you are unable to feel pain, heat, or cold. You may have a greater risk of getting burned. Try taking hot showers or baths to help relax tight muscles. General instructions If you are having frequent leg cramps, avoid intense exercise for several days. Take over-the-counter and prescription medicines only as told by your health care provider. Keep all follow-up visits. This is important. Contact a health care provider if: Your leg cramps get more severe or more frequent, or they do not improve over time. Your foot becomes cold, numb, or blue. Summary Muscle cramps can develop in any muscle, but the most common place is in the calf muscles of the leg. Leg cramps are painful, and they may last for a few seconds to a few minutes. Usually, leg cramps are not caused by a serious medical problem. Often, the cause is not known. Stay hydrated, and take over-the-counter and prescription medicines only as told by your health care provider. This information is not intended to replace advice given to you by your health care provider. Make sure you discuss any questions you have with your healthcare provider. Document Revised: 02/03/2020 Document Reviewed: 02/03/2020 Elsevier Patient Education  [employer].

## 2021-04-19 NOTE — Assessment & Plan Note (Signed)
**Note De-Identified [place] [facility]** Patient continues to report bilateral lower extremity pain.  Patient recently completed radiation for lung cancer. I suspect that this could be a side effect of radiation treatment.  Pain is moderate, no numbness, tingling associated with symptoms.  Advised patient to elevate feet, warm Epson salt soak, Tylenol as tolerated for pain.  Follow-up with worsening or unresolved symptoms.  Patient verbalized understanding.

## 2021-04-19 NOTE — Progress Notes (Signed)
**Note De-Identified Figueira Obfuscation** Acute Office Visit  Subjective:    Patient ID: Hailey Hernandez, female    DOB: 1955/10/02, 65 y.o.   MRN: [id number]  Chief Complaint  Patient presents with   Leg Swelling    HPI Patient is in today for Pain  She reports recurrent bilateral lower leg pain. was not an injury that may have caused the pain. The pain started a few weeks ago and is worsening. Patient recently completed radiation therapy for lung cancer.The pain does not radiate . The pain is described as aching, is moderate in intensity, occurring intermittently. Symptoms are worse in the: mid-day  Aggravating factors: walking Relieving factors: none.  She has tried application of ice with no relief.   ---------------------------------------------------------------------------------------------------   Past Medical History:  Diagnosis Date   Allergy    Anxiety disorder    Arthritis    bil legs   Asthma    Cancer ([place])    Chronic bronchitis    Complication of anesthesia    slow to wake up, one time she turned blue in recovery   COPD (chronic obstructive pulmonary disease) (HCC)    Depression    Diverticulitis    DJD (degenerative joint disease)    spine   Dyspnea    O2 at 3 L 24/7   Fibromyalgia    GERD (gastroesophageal reflux disease)    Glaucoma    History of thyroid cancer    HTN (hypertension)    Hyperlipidemia    Impaired glucose tolerance 05/08/2013   OSA (obstructive sleep apnea)    CPAP  last sleep study 8-10 yr.ag0   Polymyalgia ([place])    Pre-diabetes    Psoriasis    Rhinitis    Tobacco abuse     Past Surgical History:  Procedure Laterality Date   APPENDECTOMY     BRONCHIAL BIOPSY  01/24/2021   Procedure: BRONCHIAL BIOPSIES;  Surgeon: Garner Nash, DO;  Location: [facility];  Service: Pulmonary;;   BRONCHIAL BRUSHINGS  01/24/2021   Procedure: BRONCHIAL BRUSHINGS;  Surgeon: Garner Nash, DO;  Location: [facility];  Service: Pulmonary;;   BRONCHIAL NEEDLE ASPIRATION BIOPSY   01/24/2021   Procedure: BRONCHIAL NEEDLE ASPIRATION BIOPSIES;  Surgeon: Garner Nash, DO;  Location: [facility];  Service: Pulmonary;;   CARPAL TUNNEL RELEASE  10/30/2011   Procedure: CARPAL TUNNEL RELEASE;  Surgeon: Wynonia Sours, MD;  Location: [facility];  Service: Orthopedics;  Laterality: Right;  and mass excision   CHOLECYSTECTOMY     CHOLESTEATOMA EXCISION     right ear   left shoulder spurs     ORIF right lower leg     partial throidectomy     TOTAL ABDOMINAL HYSTERECTOMY     VIDEO BRONCHOSCOPY WITH ENDOBRONCHIAL NAVIGATION N/A 01/24/2021   Procedure: VIDEO BRONCHOSCOPY WITH ENDOBRONCHIAL NAVIGATION;  Surgeon: Garner Nash, DO;  Location: [facility];  Service: Pulmonary;  Laterality: N/A;   VIDEO BRONCHOSCOPY WITH ENDOBRONCHIAL ULTRASOUND N/A 01/24/2021   Procedure: VIDEO BRONCHOSCOPY WITH ENDOBRONCHIAL ULTRASOUND;  Surgeon: Garner Nash, DO;  Location: [facility];  Service: Pulmonary;  Laterality: N/A;    Family History  Problem Relation Age of Onset   Emphysema Sister    Hypertension Sister    Heart attack Sister    Emphysema Sister    Alpha-1 antitrypsin deficiency Sister    Alpha-1 antitrypsin deficiency Sister    Allergies Sister    Asthma Sister    Heart disease Mother    Cancer Mother **Note De-Identified Lupien Obfuscation** Hyperlipidemia Mother    Hypertension Mother    Heart attack Mother    Heart disease Father    Heart attack Father    Cancer Sister    Cancer Brother    Hyperlipidemia Brother    Hypertension Brother    Tuberculosis Other        grandmother   Hyperlipidemia Daughter    Hypertension Daughter    Hypertension Son     Social History   Socioeconomic History   Marital status: Married    Spouse name: Not on file   Number of children: 3   Years of education: 9   Highest education level: Not on file  Occupational History   Occupation: disabled    Employer: DISABLED  Tobacco Use   Smoking status: Every Day    Packs/day: 1.50    Years: 45.00     Pack years: 67.50    Types: Cigarettes   Smokeless tobacco: Never  Vaping Use   Vaping Use: Never used  Substance and Sexual Activity   Alcohol use: No   Drug use: No   Sexual activity: Not on file  Other Topics Concern   Not on file  Social History Narrative   Married w/ children   Daily caffeine use   Social Determinants of Health   Financial Resource Strain: Not on file  Food Insecurity: Not on file  Transportation Needs: Not on file  Physical Activity: Not on file  Stress: Not on file  Social Connections: Not on file  Intimate Partner Violence: Not on file    Outpatient Medications Prior to Visit  Medication Sig Dispense Refill   albuterol (PROVENTIL) (2.5 MG/3ML) 0.083% nebulizer solution Take 3 mLs (2.5 mg total) by nebulization every 4 (four) hours as needed for wheezing or shortness of breath. DX: 496 180 mL 12   albuterol (VENTOLIN HFA) 108 (90 Base) MCG/ACT inhaler Inhale 2 puffs every 4-6 hors as needed- rescue 18 g 12   alendronate (FOSAMAX) 70 MG tablet Take 1 tablet (70 mg total) by mouth every 7 (seven) days. Take with a full glass of water on an empty stomach. (Patient taking differently: Take 70 mg by mouth every 7 (seven) days. Take with a full glass of water on an empty stomach. Sunday) 4 tablet 11   aspirin EC 81 MG tablet Take 81 mg by mouth daily.     atorvastatin (LIPITOR) 80 MG tablet TAKE 1 TABLET ONCE DAILY 90 tablet 0   Budeson-Glycopyrrol-Formoterol (BREZTRI AEROSPHERE) 160-9-4.8 MCG/ACT AERO Inhale 2 puffs into the lungs 2 (two) times daily. 10.7 g 5   calcium carbonate (CALTRATE 600) 1500 (600 Ca) MG TABS tablet Take 1 tablet (1,500 mg total) by mouth 2 (two) times daily with a meal. 60 tablet 3   colchicine 0.6 MG tablet TAKE 1 TABLET ONCE DAILY 30 tablet 0   FLUoxetine (PROZAC) 20 MG capsule Take 1 capsule (20 mg total) by mouth daily. 90 capsule 3   fluticasone (FLONASE) 50 MCG/ACT nasal spray Place 2 sprays into both nostrils daily. 16 g 6    lisinopril (ZESTRIL) 20 MG tablet TAKE 1 TABLET BY MOUTH DAILY. 90 tablet 0   montelukast (SINGULAIR) 10 MG tablet TAKE 1 TABLET EVERY DAY 90 tablet 0   Nebulizers (COMPRESSOR/NEBULIZER) MISC Use up to 4 times daily when needed 1 each 0   prochlorperazine (COMPAZINE) 10 MG tablet Take 1 tablet (10 mg total) by mouth every 6 (six) hours as needed for nausea or vomiting. 30 tablet **Note De-Identified Abdalla Obfuscation** 0   Respiratory Therapy Supplies (FLUTTER) DEVI Patient to use device for ten minutes, three times daily 1 each 0   sucralfate (CARAFATE) 1 g tablet Take 1 tablet (1 g total) by mouth 4 (four) times daily. Dissolve each tablet in 15 cc water before use. 120 tablet 2   No facility-administered medications prior to visit.    Allergies  Allergen Reactions   Doxycycline Shortness Of Breath and Other (See Comments)    Drug interaction with Soriatane   Tramadol Shortness Of Breath    Did not work   Milnacipran     REACTION: dizzy   Apremilast Rash    Rash, able to take this currently    Review of Systems  Constitutional: Negative.   HENT: Negative.    Gastrointestinal: Negative.   Genitourinary: Negative.   Musculoskeletal:  Positive for joint swelling.  All other systems reviewed and are negative.     Objective:    Physical Exam Vitals and nursing note reviewed. Exam conducted with a chaperone present (spouse).  Constitutional:      Appearance: Normal appearance.  HENT:     Head: Normocephalic.     Nose: Nose normal.     Mouth/Throat:     Mouth: Mucous membranes are moist.     Pharynx: Oropharynx is clear.  Eyes:     Conjunctiva/sclera: Conjunctivae normal.  Cardiovascular:     Rate and Rhythm: Normal rate and regular rhythm.     Pulses: Normal pulses.     Heart sounds: Normal heart sounds.  Pulmonary:     Effort: Pulmonary effort is normal.     Breath sounds: Normal breath sounds.  Abdominal:     General: Bowel sounds are normal.  Musculoskeletal:     Right lower leg: Swelling and  tenderness present.     Left lower leg: Swelling and tenderness present.  Skin:    Findings: Erythema and rash present.  Neurological:     Mental Status: She is alert.    BP (!) 128/59   Pulse 99   Temp 98.3 F (36.8 C) (Temporal)   Ht '5\' 5"'  (1.651 m)   Wt 203 lb (92.1 kg)   SpO2 94%   BMI 33.78 kg/m  Wt Readings from Last 3 Encounters:  04/19/21 203 lb (92.1 kg)  04/11/21 206 lb 14.4 oz (93.8 kg)  03/27/21 214 lb 1.6 oz (97.1 kg)    Health Maintenance Due  Topic Date Due   Zoster Vaccines- Shingrix (1 of 2) Never done   OPHTHALMOLOGY EXAM  04/02/2018   FOOT EXAM  05/21/2018   COLONOSCOPY (Pts 45-16yr Insurance coverage will need to be confirmed)  10/19/2020   COVID-19 Vaccine (3 - Booster for Janssen series) 12/06/2020   HEMOGLOBIN A1C  02/02/2021    There are no preventive care reminders to display for this patient.   Lab Results  Component Value Date   TSH 1.590 06/18/2018   Lab Results  Component Value Date   WBC 3.9 (L) 04/11/2021   HGB 11.2 (L) 04/11/2021   HCT 35.2 (L) 04/11/2021   MCV 104.5 (H) 04/11/2021   PLT 72 (L) 04/11/2021   Lab Results  Component Value Date   NA 142 04/11/2021   K 4.4 04/11/2021   CHLORIDE 99 08/26/2015   CO2 32 04/11/2021   GLUCOSE 130 (H) 04/11/2021   BUN 11 04/11/2021   CREATININE 0.84 04/11/2021   BILITOT 0.8 04/11/2021   ALKPHOS 166 (H) 04/11/2021   AST 31 04/11/2021 **Note De-Identified Flenniken Obfuscation** ALT 19 04/11/2021   PROT 7.4 04/11/2021   ALBUMIN 3.1 (L) 04/11/2021   CALCIUM 9.4 04/11/2021   ANIONGAP 9 04/11/2021   EGFR >90 08/26/2015   GFR 73.52 04/07/2018   Lab Results  Component Value Date   CHOL 179 12/31/2016   Lab Results  Component Value Date   HDL 38 (L) 12/31/2016   Lab Results  Component Value Date   LDLCALC 88 12/31/2016   Lab Results  Component Value Date   TRIG 263 (H) 12/31/2016   Lab Results  Component Value Date   CHOLHDL 4.7 (H) 12/31/2016   Lab Results  Component Value Date   HGBA1C 4.6 08/05/2020        Assessment & Plan:   Problem List Items Addressed This Visit       Other   Pain in both lower extremities - Primary    Patient continues to report bilateral lower extremity pain.  Patient recently completed radiation for lung cancer. I suspect that this could be a side effect of radiation treatment.  Pain is moderate, no numbness, tingling associated with symptoms.  Advised patient to elevate feet, warm Epson salt soak, Tylenol as tolerated for pain.  Follow-up with worsening or unresolved symptoms.  Patient verbalized understanding.         No orders of the defined types were placed in this encounter.    Ivy Lynn, NP

## 2021-04-25 ENCOUNTER — Encounter: Payer: Self-pay | Admitting: Hematology

## 2021-04-25 NOTE — Progress Notes (Signed)
**Note De-identified Geigle Obfuscation**                                                                                                                                                             **Note De-Identified Renteria Obfuscation** Patient Name: Hailey Hernandez MRN: [id number] DOB: 1956/05/03 Referring Physician: Curt Bears (Profile Not Attached) Date of Service: 04/10/2021 [facility], [place]                                                        End Of Treatment Note  Diagnoses: C34.12-Malignant neoplasm of upper lobe, left bronchus or lung  Cancer Staging: Stage IIB, cT1cN1M0 NSCLC, squamous cell carcinoma of the LUL  Intent: Curative  Radiation Treatment Dates: 02/20/2021 through 04/10/2021 Site Technique Total Dose (Gy) Dose per Fx (Gy) Completed Fx Beam Energies  Lung, Left: Lung_Lt 3D 60/60 2 30/30 6X, 10X, 15X  Lung, Left: Lung_Lt_Bst 3D 6/6 2 3/3 6X, 10X, 15X   Narrative: The patient tolerated radiation therapy relatively well. She did have fatigue and erythema with dry desquamation of the skin in the treatment field. She continued having shortness of breath, occasional hemoptysis, and using O2 at 3L [place].   Plan: The patient will receive a call in about one month from the radiation oncology department. She will continue follow up with Dr. Julien Nordmann as well.   ________________________________________________    Carola Rhine, [facility]

## 2021-04-27 ENCOUNTER — Inpatient Hospital Stay: Payer: Medicare Other

## 2021-04-27 ENCOUNTER — Ambulatory Visit (HOSPITAL_COMMUNITY)
Admission: RE | Admit: 2021-04-27 | Discharge: 2021-04-27 | Disposition: A | Discharge status: Home / Self Care | Payer: Medicare Other | Source: Ambulatory Visit | Attending: Internal Medicine | Admitting: Internal Medicine

## 2021-04-27 ENCOUNTER — Other Ambulatory Visit: Payer: Self-pay

## 2021-04-27 DIAGNOSIS — Z923 Personal history of irradiation: Secondary | ICD-10-CM | POA: Diagnosis not present

## 2021-04-27 DIAGNOSIS — R911 Solitary pulmonary nodule: Secondary | ICD-10-CM | POA: Diagnosis not present

## 2021-04-27 DIAGNOSIS — C3490 Malignant neoplasm of unspecified part of unspecified bronchus or lung: Secondary | ICD-10-CM

## 2021-04-27 DIAGNOSIS — C3412 Malignant neoplasm of upper lobe, left bronchus or lung: Secondary | ICD-10-CM | POA: Diagnosis not present

## 2021-04-27 DIAGNOSIS — R042 Hemoptysis: Secondary | ICD-10-CM | POA: Diagnosis not present

## 2021-04-27 DIAGNOSIS — F17200 Nicotine dependence, unspecified, uncomplicated: Secondary | ICD-10-CM | POA: Diagnosis not present

## 2021-04-27 LAB — CMP (CANCER CENTER ONLY)
ALT: 16 U/L (ref 0–44)
AST: 29 U/L (ref 15–41)
Albumin: 2.9 g/dL — ABNORMAL LOW (ref 3.5–5.0)
Alkaline Phosphatase: 149 U/L — ABNORMAL HIGH (ref 38–126)
Anion gap: 7 (ref 5–15)
BUN: 18 mg/dL (ref 8–23)
CO2: 33 mmol/L — ABNORMAL HIGH (ref 22–32)
Calcium: 9.2 mg/dL (ref 8.9–10.3)
Chloride: 102 mmol/L (ref 98–111)
Creatinine: 1.12 mg/dL — ABNORMAL HIGH (ref 0.44–1.00)
GFR, Estimated: 54 mL/min — ABNORMAL LOW (ref >60)
Glucose, Bld: 140 mg/dL — ABNORMAL HIGH (ref 70–99)
Potassium: 4.3 mmol/L (ref 3.5–5.1)
Sodium: 142 mmol/L (ref 135–145)
Total Bilirubin: 0.5 mg/dL (ref 0.3–1.2)
Total Protein: 7.2 g/dL (ref 6.5–8.1)

## 2021-04-27 LAB — CBC WITH DIFFERENTIAL (CANCER CENTER ONLY)
Abs Immature Granulocytes: 0.01 10*3/uL (ref 0.00–0.07)
Basophils Absolute: 0 10*3/uL (ref 0.0–0.1)
Basophils Relative: 1 %
Eosinophils Absolute: 0.1 10*3/uL (ref 0.0–0.5)
Eosinophils Relative: 4 %
HCT: 33.6 % — ABNORMAL LOW (ref 36.0–46.0)
Hemoglobin: 10.6 g/dL — ABNORMAL LOW (ref 12.0–15.0)
Immature Granulocytes: 0 %
Lymphocytes Relative: 18 %
Lymphs Abs: 0.7 10*3/uL (ref 0.7–4.0)
MCH: 33.8 pg (ref 26.0–34.0)
MCHC: 31.5 g/dL (ref 30.0–36.0)
MCV: 107 fL — ABNORMAL HIGH (ref 80.0–100.0)
Monocytes Absolute: 0.4 10*3/uL (ref 0.1–1.0)
Monocytes Relative: 11 %
Neutro Abs: 2.7 10*3/uL (ref 1.7–7.7)
Neutrophils Relative %: 66 %
Platelet Count: 91 10*3/uL — ABNORMAL LOW (ref 150–400)
RBC: 3.14 MIL/uL — ABNORMAL LOW (ref 3.87–5.11)
RDW: 15.9 % — ABNORMAL HIGH (ref 11.5–15.5)
WBC Count: 3.9 10*3/uL — ABNORMAL LOW (ref 4.0–10.5)
nRBC: 0 % (ref 0.0–0.2)

## 2021-04-27 MED ORDER — IOHEXOL 350 MG/ML SOLN
75.0000 mL | Freq: Once | INTRAVENOUS | Status: AC | PRN
  Administered 2021-04-27: 60 mL via INTRAVENOUS

## 2021-05-02 ENCOUNTER — Encounter: Payer: Self-pay | Admitting: Internal Medicine

## 2021-05-02 ENCOUNTER — Inpatient Hospital Stay: Payer: Medicare Other | Attending: Internal Medicine | Admitting: Internal Medicine

## 2021-05-02 ENCOUNTER — Other Ambulatory Visit: Payer: Self-pay

## 2021-05-02 VITALS — BP 127/58 | HR 92 | Temp 98.4°F | Resp 20 | Ht 65.0 in | Wt 204.4 lb

## 2021-05-02 DIAGNOSIS — G4733 Obstructive sleep apnea (adult) (pediatric): Secondary | ICD-10-CM | POA: Diagnosis not present

## 2021-05-02 DIAGNOSIS — R590 Localized enlarged lymph nodes: Secondary | ICD-10-CM | POA: Insufficient documentation

## 2021-05-02 DIAGNOSIS — J449 Chronic obstructive pulmonary disease, unspecified: Secondary | ICD-10-CM | POA: Diagnosis not present

## 2021-05-02 DIAGNOSIS — R0609 Other forms of dyspnea: Secondary | ICD-10-CM | POA: Diagnosis not present

## 2021-05-02 DIAGNOSIS — C3490 Malignant neoplasm of unspecified part of unspecified bronchus or lung: Secondary | ICD-10-CM | POA: Diagnosis not present

## 2021-05-02 DIAGNOSIS — Z885 Allergy status to narcotic agent status: Secondary | ICD-10-CM | POA: Diagnosis not present

## 2021-05-02 DIAGNOSIS — Z881 Allergy status to other antibiotic agents status: Secondary | ICD-10-CM | POA: Insufficient documentation

## 2021-05-02 DIAGNOSIS — M47814 Spondylosis without myelopathy or radiculopathy, thoracic region: Secondary | ICD-10-CM | POA: Diagnosis not present

## 2021-05-02 DIAGNOSIS — Z923 Personal history of irradiation: Secondary | ICD-10-CM | POA: Diagnosis not present

## 2021-05-02 DIAGNOSIS — R21 Rash and other nonspecific skin eruption: Secondary | ICD-10-CM | POA: Diagnosis not present

## 2021-05-02 DIAGNOSIS — R55 Syncope and collapse: Secondary | ICD-10-CM | POA: Insufficient documentation

## 2021-05-02 DIAGNOSIS — R5383 Other fatigue: Secondary | ICD-10-CM | POA: Insufficient documentation

## 2021-05-02 DIAGNOSIS — Z888 Allergy status to other drugs, medicaments and biological substances status: Secondary | ICD-10-CM | POA: Insufficient documentation

## 2021-05-02 DIAGNOSIS — I700 Atherosclerosis of aorta: Secondary | ICD-10-CM | POA: Insufficient documentation

## 2021-05-02 DIAGNOSIS — L409 Psoriasis, unspecified: Secondary | ICD-10-CM | POA: Insufficient documentation

## 2021-05-02 DIAGNOSIS — N261 Atrophy of kidney (terminal): Secondary | ICD-10-CM | POA: Diagnosis not present

## 2021-05-02 DIAGNOSIS — Z85850 Personal history of malignant neoplasm of thyroid: Secondary | ICD-10-CM | POA: Insufficient documentation

## 2021-05-02 DIAGNOSIS — Z9049 Acquired absence of other specified parts of digestive tract: Secondary | ICD-10-CM | POA: Insufficient documentation

## 2021-05-02 DIAGNOSIS — Z9221 Personal history of antineoplastic chemotherapy: Secondary | ICD-10-CM | POA: Insufficient documentation

## 2021-05-02 DIAGNOSIS — I2510 Atherosclerotic heart disease of native coronary artery without angina pectoris: Secondary | ICD-10-CM | POA: Insufficient documentation

## 2021-05-02 DIAGNOSIS — I10 Essential (primary) hypertension: Secondary | ICD-10-CM | POA: Insufficient documentation

## 2021-05-02 DIAGNOSIS — I358 Other nonrheumatic aortic valve disorders: Secondary | ICD-10-CM | POA: Diagnosis not present

## 2021-05-02 DIAGNOSIS — C3412 Malignant neoplasm of upper lobe, left bronchus or lung: Secondary | ICD-10-CM | POA: Diagnosis not present

## 2021-05-02 DIAGNOSIS — Z79899 Other long term (current) drug therapy: Secondary | ICD-10-CM | POA: Insufficient documentation

## 2021-05-02 DIAGNOSIS — I517 Cardiomegaly: Secondary | ICD-10-CM | POA: Insufficient documentation

## 2021-05-02 NOTE — Progress Notes (Signed)
**Note De-Identified Handy Obfuscation** [place] [phone]   [phone]  OFFICE PROGRESS NOTE  Hailey Norlander, DO [place] [address]  DIAGNOSIS: Stage IIb (T1c, N1, M0) non-small cell lung cancer, squamous cell carcinoma presented with left upper lobe lung nodule in addition to left hilar adenopathy diagnosed in April 2022.   PRIOR THERAPY: Concurrent chemoradiation with weekly carboplatin for AUC of 2 and paclitaxel 45 Mg/M2 for 6-7 weeks. First dose on 02/21/21. Status post 6 cycles.  Last dose was given on March 27, 2021.  The patient is not a candidate for consolidation immunotherapy because of the extensive psoriasis.   CURRENT THERAPY: Observation.  INTERVAL HISTORY: Hailey Hernandez 65 y.o. female returns to the clinic today for follow-up visit accompanied by her husband.  The patient is feeling fine today with no concerning complaints except for the baseline shortness of breath and mild cough.  She also continues to have mild fatigue.  She denied having any chest pain or hemoptysis.  She has no nausea, vomiting, diarrhea or constipation.  She has no headache or visual changes.  The patient had repeat CT scan of the chest performed recently and she is here for evaluation and discussion of her scan results.  MEDICAL HISTORY: Past Medical History:  Diagnosis Date   Allergy    Anxiety disorder    Arthritis    bil legs   Asthma    Cancer ([place])    Chronic bronchitis    Complication of anesthesia    slow to wake up, one time she turned blue in recovery   COPD (chronic obstructive pulmonary disease) (HCC)    Depression    Diverticulitis    DJD (degenerative joint disease)    spine   Dyspnea    O2 at 3 L 24/7   Fibromyalgia    GERD (gastroesophageal reflux disease)    Glaucoma    History of thyroid cancer    HTN (hypertension)    Hyperlipidemia    Impaired glucose tolerance 05/08/2013   OSA (obstructive sleep apnea)    CPAP  last sleep study 8-10  yr.ag0   Polymyalgia ([place])    Pre-diabetes    Psoriasis    Rhinitis    Tobacco abuse     ALLERGIES:  is allergic to doxycycline, tramadol, milnacipran, and apremilast.  MEDICATIONS:  Current Outpatient Medications  Medication Sig Dispense Refill   albuterol (PROVENTIL) (2.5 MG/3ML) 0.083% nebulizer solution Take 3 mLs (2.5 mg total) by nebulization every 4 (four) hours as needed for wheezing or shortness of breath. DX: 496 180 mL 12   albuterol (VENTOLIN HFA) 108 (90 Base) MCG/ACT inhaler Inhale 2 puffs every 4-6 hors as needed- rescue 18 g 12   alendronate (FOSAMAX) 70 MG tablet Take 1 tablet (70 mg total) by mouth every 7 (seven) days. Take with a full glass of water on an empty stomach. (Patient taking differently: Take 70 mg by mouth every 7 (seven) days. Take with a full glass of water on an empty stomach. Sunday) 4 tablet 11   aspirin EC 81 MG tablet Take 81 mg by mouth daily.     atorvastatin (LIPITOR) 80 MG tablet TAKE 1 TABLET ONCE DAILY 90 tablet 0   Budeson-Glycopyrrol-Formoterol (BREZTRI AEROSPHERE) 160-9-4.8 MCG/ACT AERO Inhale 2 puffs into the lungs 2 (two) times daily. 10.7 g 5   calcium carbonate (CALTRATE 600) 1500 (600 Ca) MG TABS tablet Take 1 tablet (1,500 mg total) by mouth 2 ( **Note De-Identified Marcinek Obfuscation** two) times daily with a meal. 60 tablet 3   colchicine 0.6 MG tablet TAKE 1 TABLET ONCE DAILY 30 tablet 0   FLUoxetine (PROZAC) 20 MG capsule Take 1 capsule (20 mg total) by mouth daily. 90 capsule 3   fluticasone (FLONASE) 50 MCG/ACT nasal spray Place 2 sprays into both nostrils daily. 16 g 6   lisinopril (ZESTRIL) 20 MG tablet TAKE 1 TABLET BY MOUTH DAILY. 90 tablet 0   montelukast (SINGULAIR) 10 MG tablet TAKE 1 TABLET EVERY DAY 90 tablet 0   Nebulizers (COMPRESSOR/NEBULIZER) MISC Use up to 4 times daily when needed 1 each 0   Respiratory Therapy Supplies (FLUTTER) DEVI Patient to use device for ten minutes, three times daily 1 each 0   prochlorperazine (COMPAZINE) 10 MG tablet Take 1 tablet  (10 mg total) by mouth every 6 (six) hours as needed for nausea or vomiting. (Patient not taking: Reported on 05/02/2021) 30 tablet 0   sucralfate (CARAFATE) 1 g tablet Take 1 tablet (1 g total) by mouth 4 (four) times daily. Dissolve each tablet in 15 cc water before use. (Patient not taking: Reported on 05/02/2021) 120 tablet 2   No current facility-administered medications for this visit.    SURGICAL HISTORY:  Past Surgical History:  Procedure Laterality Date   APPENDECTOMY     BRONCHIAL BIOPSY  01/24/2021   Procedure: BRONCHIAL BIOPSIES;  Surgeon: Garner Nash, DO;  Location: [facility];  Service: Pulmonary;;   BRONCHIAL BRUSHINGS  01/24/2021   Procedure: BRONCHIAL BRUSHINGS;  Surgeon: Garner Nash, DO;  Location: [facility];  Service: Pulmonary;;   BRONCHIAL NEEDLE ASPIRATION BIOPSY  01/24/2021   Procedure: BRONCHIAL NEEDLE ASPIRATION BIOPSIES;  Surgeon: Garner Nash, DO;  Location: [facility];  Service: Pulmonary;;   CARPAL TUNNEL RELEASE  10/30/2011   Procedure: CARPAL TUNNEL RELEASE;  Surgeon: Wynonia Sours, MD;  Location: [facility];  Service: Orthopedics;  Laterality: Right;  and mass excision   CHOLECYSTECTOMY     CHOLESTEATOMA EXCISION     right ear   left shoulder spurs     ORIF right lower leg     partial throidectomy     TOTAL ABDOMINAL HYSTERECTOMY     VIDEO BRONCHOSCOPY WITH ENDOBRONCHIAL NAVIGATION N/A 01/24/2021   Procedure: VIDEO BRONCHOSCOPY WITH ENDOBRONCHIAL NAVIGATION;  Surgeon: Garner Nash, DO;  Location: [facility];  Service: Pulmonary;  Laterality: N/A;   VIDEO BRONCHOSCOPY WITH ENDOBRONCHIAL ULTRASOUND N/A 01/24/2021   Procedure: VIDEO BRONCHOSCOPY WITH ENDOBRONCHIAL ULTRASOUND;  Surgeon: Garner Nash, DO;  Location: [facility];  Service: Pulmonary;  Laterality: N/A;    REVIEW OF SYSTEMS:  Constitutional: positive for fatigue Eyes: negative Ears, nose, mouth, throat, and face: negative Respiratory: positive for cough and  dyspnea on exertion Cardiovascular: negative Gastrointestinal: negative Genitourinary:negative Integument/breast: positive for rash, skin lesion(s), and extensive psoriasis. Hematologic/lymphatic: negative Musculoskeletal:negative Neurological: negative Behavioral/Psych: negative Endocrine: negative Allergic/Immunologic: negative   PHYSICAL EXAMINATION: General appearance: alert, cooperative, fatigued, and no distress Head: Normocephalic, without obvious abnormality, atraumatic Neck: no adenopathy, no JVD, supple, symmetrical, trachea midline, and thyroid not enlarged, symmetric, no tenderness/mass/nodules Lymph nodes: Cervical, supraclavicular, and axillary nodes normal. Resp: clear to auscultation bilaterally Back: negative, symmetric, no curvature. ROM normal. No CVA tenderness. Cardio: regular rate and rhythm, S1, S2 normal, no murmur, click, rub or gallop GI: soft, non-tender; bowel sounds normal; no masses,  no organomegaly Extremities: extremities normal, atraumatic, no cyanosis or edema Neurologic: Alert and oriented X 3, normal strength and tone. **Note De-Identified Reigel Obfuscation** Normal symmetric reflexes. Normal coordination and gaitand mild tenderness to palpation on the left calf.  ECOG PERFORMANCE STATUS: 1 - Symptomatic but completely ambulatory  Blood pressure (!) 127/58, pulse 92, temperature 98.4 F (36.9 C), temperature source Tympanic, resp. rate 20, height 5\' 5"  (1.651 m), weight 204 lb 6.4 oz (92.7 kg), SpO2 94 %.  LABORATORY DATA: Lab Results  Component Value Date   WBC 3.9 (L) 04/27/2021   HGB 10.6 (L) 04/27/2021   HCT 33.6 (L) 04/27/2021   MCV 107.0 (H) 04/27/2021   PLT 91 (L) 04/27/2021      Chemistry      Component Value Date/Time   NA 142 04/27/2021 1434   NA 142 08/05/2020 1502   NA 142 08/26/2015 1027   K 4.3 04/27/2021 1434   K 4.3 08/26/2015 1027   CL 102 04/27/2021 1434   CO2 33 (H) 04/27/2021 1434   CO2 34 (H) 08/26/2015 1027   BUN 18 04/27/2021 1434   BUN 14  08/05/2020 1502   BUN 9.6 08/26/2015 1027   CREATININE 1.12 (H) 04/27/2021 1434   CREATININE 0.7 08/26/2015 1027      Component Value Date/Time   CALCIUM 9.2 04/27/2021 1434   CALCIUM 9.5 08/26/2015 1027   ALKPHOS 149 (H) 04/27/2021 1434   ALKPHOS 131 08/26/2015 1027   AST 29 04/27/2021 1434   AST 15 08/26/2015 1027   ALT 16 04/27/2021 1434   ALT 15 08/26/2015 1027   BILITOT 0.5 04/27/2021 1434   BILITOT 0.66 08/26/2015 1027       RADIOGRAPHIC STUDIES: CT Chest W Contrast  Result Date: 04/28/2021 CLINICAL DATA:  Primary Cancer Type: Lung Imaging Indication: Assess response to therapy Interval therapy since last imaging? Yes Initial Cancer Diagnosis Date: 01/24/2021; Established by: Biopsy-proven Detailed Pathology: Stage IIb non-small cell lung cancer, squamous cell carcinoma. Primary Tumor location: Left upper lobe. Surgeries: Cholecystectomy, hysterectomy, appendectomy. Chemotherapy: Yes; Ongoing? No; Most recent administration: 03/27/2021 Immunotherapy? No Radiation therapy? Yes; Date Range: 02/20/2021 - 04/10/2021; Target: Left lung EXAM: CT CHEST WITH CONTRAST TECHNIQUE: Multidetector CT imaging of the chest was performed during intravenous contrast administration. CONTRAST:  70mL OMNIPAQUE IOHEXOL 350 MG/ML SOLN COMPARISON:  Most recent CT chest 12/30/2020.  01/09/2021 PET-CT. FINDINGS: Cardiovascular: Coronary, aortic arch, and branch vessel atherosclerotic vascular disease. Mitral and aortic valve calcifications. Mild cardiomegaly. Mediastinum/Nodes: Absent left thyroid lobe. Lower right paratracheal node with punctate calcification measures 1.0 cm in short axis on image 49 series 2, formerly 1.2 cm by my measurements. Additional small mediastinal lymph nodes are present. Lungs/Pleura: Progressive tree-in-bud reticulonodular opacity anteriorly in the right lower lobe for example on image 71 series 7, probably from atypical infectious bronchiolitis given the pattern. Most of the  tree-in-bud nodularity previously present in the right upper lobe has resolved. There is also been resolution of a substantial portion of the right middle lobe and medial right lower lobe tree-in-bud nodularity. Cavitary left upper lobe nodule 1.6 by 1.2 cm on image 26 series 7, formerly 2.5 by 1.8 cm. Patchy ground-glass lesions in the left upper lobe for example on image 58 series 7, new from prior and probably from alveolitis. Adjacent scarring noted. Admits these ground-glass opacities, there is a part solid lesion with ground-glass component measuring 1.5 by 1.2 cm and central more solid component measuring 0.5 by 0.3 cm on image 50 series 7, meriting surveillance. Dense calcified granuloma posteromedially in the left lower lobe on image 99 series 7, stable. New indistinct 4 mm left lower lobe **Note De-Identified Krach Obfuscation** nodule on image 69 series 7. Upper Abdomen: Atrophy of the visualized part of the left kidney upper pole. Possible hepatosplenomegaly. Probable cirrhosis. Right gastric lymph nodes measure up to 1.0 cm in short axis on image 123 series 2 (formerly 1.2 cm). Faint stranding along the porta hepatis and root of the mesentery, similar to previous. Musculoskeletal: Mild thoracic spondylosis. IMPRESSION: 1. Reduction in size of the dominant cavitary left upper lobe nodule, currently 1.6 by 1.2 cm and previously 2.5 by 1.8 cm. Reduced size of previous mildly enlarged right lower paratracheal and right gastric lymph nodes. 2. Much of the tree-in-bud nodularity previously in the right lung has resolved, although there is some new tree-in-bud nodularity in the right lower lobe characteristic of atypical infectious bronchiolitis. 3. There is some new patchy primarily ground-glass opacities in the left upper lobe which are likely inflammatory but merit surveillance. 4. Other imaging findings of potential clinical significance: Aortic Atherosclerosis (ICD10-I70.0). Coronary atherosclerosis. Mitral and aortic valve calcifications. Mild  cardiomegaly. Atrophic left kidney upper pole. Possible hepatosplenomegaly. Faint nonspecific but chronic stranding along the porta hepatis and root of the mesentery. Probable hepatic cirrhosis. Electronically Signed   By: Van Clines M.D.   On: 04/28/2021 11:05    ASSESSMENT AND PLAN: This is a very pleasant 64 years old white female recently diagnosed with unresectable stage IIb (T1c, N1, M0) non-small cell lung cancer, squamous cell carcinoma diagnosed in April 2022 and presented with left upper lobe lung nodule in addition to left hilar adenopathy. The patient completed a course of concurrent chemoradiation with weekly carboplatin for AUC of 2 and paclitaxel 45 Mg/M2 status post 6 cycles.   The patient tolerated this course fairly well except for mild fatigue and weight loss.  She is recovering slowly after her treatment. She had repeat CT scan of the chest performed recently.  I personally and independently reviewed the scan and discussed the result with the patient and her husband. Her scan showed improvement of her disease with decrease in the size of the dominant cavitary left upper lobe lung nodule in addition to the right lower paratracheal and right gastric lymph nodes. I recommended for the patient to continue on observation with close monitoring of her condition. The patient is not a good candidate for consolidation immunotherapy because of the extensive psoriasis. I will see her back for follow-up visit in 3 months for evaluation with repeat CT scan of the chest for restaging of her disease. The patient was advised to call immediately if she has any concerning symptoms in the interval.  The patient voices understanding of current disease status and treatment options and is in agreement with the current care plan.  All questions were answered. The patient knows to call the clinic with any problems, questions or concerns. We can certainly see the patient much sooner if  necessary.  Disclaimer: This note was dictated with voice recognition software. Similar sounding words can inadvertently be transcribed and may not be corrected upon review.

## 2021-05-03 ENCOUNTER — Encounter: Payer: Self-pay | Admitting: Family Medicine

## 2021-05-03 ENCOUNTER — Ambulatory Visit (INDEPENDENT_AMBULATORY_CARE_PROVIDER_SITE_OTHER): Payer: Medicare Other | Admitting: Family Medicine

## 2021-05-03 VITALS — BP 132/60 | HR 84 | Temp 98.2°F | Resp 20 | Ht 65.0 in | Wt 205.0 lb

## 2021-05-03 DIAGNOSIS — I10 Essential (primary) hypertension: Secondary | ICD-10-CM

## 2021-05-03 DIAGNOSIS — E119 Type 2 diabetes mellitus without complications: Secondary | ICD-10-CM

## 2021-05-03 DIAGNOSIS — E785 Hyperlipidemia, unspecified: Secondary | ICD-10-CM

## 2021-05-03 DIAGNOSIS — I152 Hypertension secondary to endocrine disorders: Secondary | ICD-10-CM | POA: Diagnosis not present

## 2021-05-03 DIAGNOSIS — C3412 Malignant neoplasm of upper lobe, left bronchus or lung: Secondary | ICD-10-CM

## 2021-05-03 DIAGNOSIS — E1169 Type 2 diabetes mellitus with other specified complication: Secondary | ICD-10-CM | POA: Diagnosis not present

## 2021-05-03 DIAGNOSIS — Z1211 Encounter for screening for malignant neoplasm of colon: Secondary | ICD-10-CM

## 2021-05-03 DIAGNOSIS — J9611 Chronic respiratory failure with hypoxia: Secondary | ICD-10-CM

## 2021-05-03 DIAGNOSIS — N179 Acute kidney failure, unspecified: Secondary | ICD-10-CM | POA: Diagnosis not present

## 2021-05-03 DIAGNOSIS — Z87891 Personal history of nicotine dependence: Secondary | ICD-10-CM

## 2021-05-03 DIAGNOSIS — E1159 Type 2 diabetes mellitus with other circulatory complications: Secondary | ICD-10-CM

## 2021-05-03 LAB — BAYER DCA HB A1C WAIVED: HB A1C (BAYER DCA - WAIVED): 5 % (ref <7.0)

## 2021-05-03 NOTE — Patient Instructions (Signed)
**Note De-Identified Ronning Obfuscation** I AM SOOOOOO Nathaneil Canary OF YOU FOR STOPPING SMOKING!

## 2021-05-03 NOTE — Progress Notes (Signed)
**Note De-Identified Capers Obfuscation** Subjective: CC: Follow-up squamous cell carcinoma, diabetes PCP: [name], DO HDQ:[id number] C Hailey Hernandez is a 65 y.o. female presenting to clinic today for:  1.  Squamous cell carcinoma Patient was with cell carcinoma of the bronchus of the left upper lobe.  She is status post treat with radiation and chemotherapy.  She will be seeing her oncologist every 3 months for repeat imaging.  Overall she is feeling a little worn out but doing okay.  She has totally been abstinent from smoking for over 2 weeks now.  Her husband has been abstinent as well.  She has no plans to resume smoking but does admit that she gets cravings still.  She continues to use supplemental oxygen Roepke nasal cannula.  She feels the Prozac is working and continues to use it at 20 mg.  2.  Diet-controlled type II Diabetes with hypertension, hyperlipidemia:  Compliant with her Lipitor, lisinopril.  Last CMP showed an AKI.  She is hydrating the best her ability.  She has lost some weight during this treatment for cancer but her appetite is picking back up  Last eye exam: About 6 months ago Last foot exam: Needs Last A1c:  Lab Results  Component Value Date   HGBA1C 4.6 08/05/2020   Nephropathy screen indicated?:  On ACE inhibitor Last flu, zoster and/or pneumovax:  Immunization History  Administered Date(s) Administered   Fluad Quad(high Dose 65+) 09/11/2019, 08/05/2020   Influenza Split 08/10/2011, 09/15/2012, 05/24/2017   Influenza Whole 07/14/2010   Influenza,inj,Quad PF,6+ Mos 06/05/2013, 05/21/2014, 06/10/2015, 09/21/2016, 07/02/2018   Janssen (J&J) SARS-COV-2 Vaccination 02/01/2020   Moderna Sars-Covid-2 Vaccination 10/11/2020   PPD Test 05/17/2014, 06/30/2015, 08/06/2016   Pneumococcal Conjugate-13 10/16/2013   Pneumococcal Polysaccharide-23 07/14/2010, 06/07/2017   Tdap 04/25/2012    ROS: No chest pain.  No falls.  She does report being unsteady on her feet sometimes.    ROS: Per HPI  Allergies   Allergen Reactions   Doxycycline Shortness Of Breath and Other (See Comments)    Drug interaction with Soriatane   Tramadol Shortness Of Breath    Did not work   Milnacipran     REACTION: dizzy   Apremilast Rash    Rash, able to take this currently   Past Medical History:  Diagnosis Date   Allergy    Anxiety disorder    Arthritis    bil legs   Asthma    Cancer (HCC)    Chronic bronchitis    Complication of anesthesia    slow to wake up, one time she turned blue in recovery   COPD (chronic obstructive pulmonary disease) (HCC)    Depression    Diverticulitis    DJD (degenerative joint disease)    spine   Dyspnea    O2 at 3 L 24/7   Fibromyalgia    GERD (gastroesophageal reflux disease)    Glaucoma    History of thyroid cancer    HTN (hypertension)    Hyperlipidemia    Impaired glucose tolerance 05/08/2013   OSA (obstructive sleep apnea)    CPAP  last sleep study 8-10 yr.ag0   Polymyalgia (HCC)    Pre-diabetes    Psoriasis    Rhinitis    Tobacco abuse     Current Outpatient Medications:    albuterol (PROVENTIL) (2.5 MG/3ML) 0.083% nebulizer solution, Take 3 mLs (2.5 mg total) by nebulization every 4 (four) hours as needed for wheezing or shortness of breath. DX: 496, Disp: 180 mL, Rfl: 12 **Note De-Identified Thwaites Obfuscation** albuterol (VENTOLIN HFA) 108 (90 Base) MCG/ACT inhaler, Inhale 2 puffs every 4-6 hors as needed- rescue, Disp: 18 g, Rfl: 12   alendronate (FOSAMAX) 70 MG tablet, Take 1 tablet (70 mg total) by mouth every 7 (seven) days. Take with a full glass of water on an empty stomach. (Patient taking differently: Take 70 mg by mouth every 7 (seven) days. Take with a full glass of water on an empty stomach. Sunday), Disp: 4 tablet, Rfl: 11   aspirin EC 81 MG tablet, Take 81 mg by mouth daily., Disp: , Rfl:    atorvastatin (LIPITOR) 80 MG tablet, TAKE 1 TABLET ONCE DAILY, Disp: 90 tablet, Rfl: 0   Budeson-Glycopyrrol-Formoterol (BREZTRI AEROSPHERE) 160-9-4.8 MCG/ACT AERO, Inhale 2 puffs into the  lungs 2 (two) times daily., Disp: 10.7 g, Rfl: 5   calcium carbonate (CALTRATE 600) 1500 (600 Ca) MG TABS tablet, Take 1 tablet (1,500 mg total) by mouth 2 (two) times daily with a meal., Disp: 60 tablet, Rfl: 3   colchicine 0.6 MG tablet, TAKE 1 TABLET ONCE DAILY, Disp: 30 tablet, Rfl: 0   FLUoxetine (PROZAC) 20 MG capsule, Take 1 capsule (20 mg total) by mouth daily., Disp: 90 capsule, Rfl: 3   fluticasone (FLONASE) 50 MCG/ACT nasal spray, Place 2 sprays into both nostrils daily., Disp: 16 g, Rfl: 6   lisinopril (ZESTRIL) 20 MG tablet, TAKE 1 TABLET BY MOUTH DAILY., Disp: 90 tablet, Rfl: 0   montelukast (SINGULAIR) 10 MG tablet, TAKE 1 TABLET EVERY DAY, Disp: 90 tablet, Rfl: 0   Nebulizers (COMPRESSOR/NEBULIZER) MISC, Use up to 4 times daily when needed, Disp: 1 each, Rfl: 0   Respiratory Therapy Supplies (FLUTTER) DEVI, Patient to use device for ten minutes, three times daily, Disp: 1 each, Rfl: 0   prochlorperazine (COMPAZINE) 10 MG tablet, Take 1 tablet (10 mg total) by mouth every 6 (six) hours as needed for nausea or vomiting. (Patient not taking: Reported on 05/03/2021), Disp: 30 tablet, Rfl: 0   sucralfate (CARAFATE) 1 g tablet, Take 1 tablet (1 g total) by mouth 4 (four) times daily. Dissolve each tablet in 15 cc water before use. (Patient not taking: No sig reported), Disp: 120 tablet, Rfl: 2 Social History   Socioeconomic History   Marital status: Married    Spouse name: Not on file   Number of children: 3   Years of education: 9   Highest education level: Not on file  Occupational History   Occupation: disabled    Employer: DISABLED  Tobacco Use   Smoking status: Every Day    Packs/day: 1.50    Years: 45.00    Pack years: 67.50    Types: Cigarettes   Smokeless tobacco: Never  Vaping Use   Vaping Use: Never used  Substance and Sexual Activity   Alcohol use: No   Drug use: No   Sexual activity: Not on file  Other Topics Concern   Not on file  Social History Narrative    Married w/ children   Daily caffeine use   Social Determinants of Health   Financial Resource Strain: Not on file  Food Insecurity: Not on file  Transportation Needs: Not on file  Physical Activity: Not on file  Stress: Not on file  Social Connections: Not on file  Intimate Partner Violence: Not on file   Family History  Problem Relation Age of Onset   Emphysema Sister    Hypertension Sister    Heart attack Sister    Emphysema Sister **Note De-Identified Dewan Obfuscation** Alpha-1 antitrypsin deficiency Sister    Alpha-1 antitrypsin deficiency Sister    Allergies Sister    Asthma Sister    Heart disease Mother    Cancer Mother    Hyperlipidemia Mother    Hypertension Mother    Heart attack Mother    Heart disease Father    Heart attack Father    Cancer Sister    Cancer Brother    Hyperlipidemia Brother    Hypertension Brother    Tuberculosis Other        grandmother   Hyperlipidemia Daughter    Hypertension Daughter    Hypertension Son     Objective: Office vital signs reviewed. BP 132/60   Pulse 84   Temp 98.2 F (36.8 C)   Resp 20   Ht '5\' 5"'  (1.651 m)   Wt 205 lb (93 kg)   SpO2 94% Comment: on 3 liters  BMI 34.11 kg/m   Physical Examination:  General: Awake, alert, chronically ill-appearing female, No acute distress Cardio: regular rate and rhythm, S1S2 heard, no murmurs appreciated Pulm: Globally decreased breath sounds.  Normal work of breathing on nasal cannula GI: soft, non-tender, non-distended, bowel sounds present x4, no hepatomegaly, no splenomegaly, no masses MSK: Requires minimal assistance for ambulation Skin: plaques along bilateral upper extremities noted Neuro: See diabetic foot exam Diabetic Foot Exam - Simple   Simple Foot Form Diabetic Foot exam was performed with the following findings: Yes 05/03/2021  4:38 PM  Visual Inspection See comments: Yes Sensation Testing See comments: Yes Pulse Check Posterior Tibialis and Dorsalis pulse intact bilaterally:  Yes Comments +2 pedal pulses.  She has totally absent monofilament sensation on the right foot but it is present in the left foot.  She has onychomycotic changes to bilateral feet and nails.  No ulcers or preulcerative calluses.      Assessment/ Plan: 65 y.o. female   Diet-controlled diabetes mellitus ([place]) - Plan: CBC with Differential/Platelet, Bayer DCA Hb A1c Waived  Hypertension associated with diabetes ([place]) - Plan: CMP14+EGFR, CBC with Differential/Platelet  Hyperlipidemia associated with type 2 diabetes mellitus ([place])  Acute kidney injury ([place]) - Plan: CMP14+EGFR  Former cigarette smoker  Squamous cell carcinoma of bronchus in left upper lobe ([place]) - Plan: Ambulatory referral to Gastroenterology  Chronic respiratory failure with hypoxia ([place]) - Plan: Ambulatory referral to Gastroenterology  Colon cancer screening - Plan: Ambulatory referral to Gastroenterology  Blood sugar remains under excellent control with A1c of 5.0 today.  She will schedule diabetic eye exam at some point.  Foot exam was performed today.  Blood pressure controlled.  No changes needed  Continue statin  Noted to have an AKI on last metabolic panel.  Recheck today.  Continue hydration.  I congratulated her on smoking cessation.  She is status post treatment of her squamous cell carcinoma on the left upper lobe with chemo and radiation.  Continue to follow-up with oncology as scheduled  Recommend colon cancer screening at some point.  We will reach out to Dr. Blanch Media office to have her at least scheduled  Orders Placed This Encounter  Procedures   CMP14+EGFR   CBC with Differential/Platelet   Bayer DCA Hb A1c Waived   No orders of the defined types were placed in this encounter.    [name], DO [place] [phone]

## 2021-05-04 LAB — CBC WITH DIFFERENTIAL/PLATELET
Basophils Absolute: 0 10*3/uL (ref 0.0–0.2)
Basos: 1 %
EOS (ABSOLUTE): 0.2 10*3/uL (ref 0.0–0.4)
Eos: 4 %
Hematocrit: 31.6 % — ABNORMAL LOW (ref 34.0–46.6)
Hemoglobin: 10.3 g/dL — ABNORMAL LOW (ref 11.1–15.9)
Immature Grans (Abs): 0 10*3/uL (ref 0.0–0.1)
Immature Granulocytes: 0 %
Lymphocytes Absolute: 0.9 10*3/uL (ref 0.7–3.1)
Lymphs: 21 %
MCH: 33.3 pg — ABNORMAL HIGH (ref 26.6–33.0)
MCHC: 32.6 g/dL (ref 31.5–35.7)
MCV: 102 fL — ABNORMAL HIGH (ref 79–97)
Monocytes Absolute: 0.4 10*3/uL (ref 0.1–0.9)
Monocytes: 11 %
Neutrophils Absolute: 2.5 10*3/uL (ref 1.4–7.0)
Neutrophils: 63 %
Platelets: 93 10*3/uL — CL (ref 150–450)
RBC: 3.09 x10E6/uL — ABNORMAL LOW (ref 3.77–5.28)
RDW: 14 % (ref 11.7–15.4)
WBC: 4 10*3/uL (ref 3.4–10.8)

## 2021-05-04 LAB — CMP14+EGFR
ALT: 17 IU/L (ref 0–32)
AST: 32 IU/L (ref 0–40)
Albumin/Globulin Ratio: 1.1 — ABNORMAL LOW (ref 1.2–2.2)
Albumin: 3.4 g/dL — ABNORMAL LOW (ref 3.8–4.8)
Alkaline Phosphatase: 156 IU/L — ABNORMAL HIGH (ref 44–121)
BUN/Creatinine Ratio: 20 (ref 12–28)
BUN: 17 mg/dL (ref 8–27)
Bilirubin Total: 0.3 mg/dL (ref 0.0–1.2)
CO2: 30 mmol/L — ABNORMAL HIGH (ref 20–29)
Calcium: 8.4 mg/dL — ABNORMAL LOW (ref 8.7–10.3)
Chloride: 104 mmol/L (ref 96–106)
Creatinine, Ser: 0.83 mg/dL (ref 0.57–1.00)
Globulin, Total: 3.1 g/dL (ref 1.5–4.5)
Glucose: 114 mg/dL — ABNORMAL HIGH (ref 65–99)
Potassium: 4.5 mmol/L (ref 3.5–5.2)
Sodium: 143 mmol/L (ref 134–144)
Total Protein: 6.5 g/dL (ref 6.0–8.5)
eGFR: 77 mL/min/{1.73_m2} (ref >59)

## 2021-05-10 ENCOUNTER — Other Ambulatory Visit: Payer: Self-pay | Admitting: Family Medicine

## 2021-05-11 DIAGNOSIS — G4733 Obstructive sleep apnea (adult) (pediatric): Secondary | ICD-10-CM | POA: Diagnosis not present

## 2021-05-11 DIAGNOSIS — T85511A Breakdown (mechanical) of esophageal anti-reflux device, initial encounter: Secondary | ICD-10-CM | POA: Diagnosis not present

## 2021-05-11 DIAGNOSIS — J449 Chronic obstructive pulmonary disease, unspecified: Secondary | ICD-10-CM | POA: Diagnosis not present

## 2021-05-11 DIAGNOSIS — L400 Psoriasis vulgaris: Secondary | ICD-10-CM | POA: Diagnosis not present

## 2021-05-22 ENCOUNTER — Other Ambulatory Visit: Payer: Self-pay | Admitting: Family Medicine

## 2021-05-22 ENCOUNTER — Ambulatory Visit
Admission: RE | Admit: 2021-05-22 | Discharge: 2021-05-22 | Disposition: A | Discharge status: Home / Self Care | Payer: Medicare Other | Source: Ambulatory Visit | Attending: Internal Medicine | Admitting: Internal Medicine

## 2021-05-22 DIAGNOSIS — I10 Essential (primary) hypertension: Secondary | ICD-10-CM

## 2021-05-22 DIAGNOSIS — F419 Anxiety disorder, unspecified: Secondary | ICD-10-CM

## 2021-05-22 DIAGNOSIS — C3412 Malignant neoplasm of upper lobe, left bronchus or lung: Secondary | ICD-10-CM

## 2021-05-22 NOTE — Progress Notes (Signed)
**Note De-identified Krausz Obfuscation**  **Note De-Identified Diebel Obfuscation** Radiation Oncology         [phone] ________________________________  Name: Hailey Hernandez MRN: [id number]  Date of Service: 05/22/2021  DOB: Feb 24, 1956  Post Treatment Telephone Note  Diagnosis:   Stage IIB, cT1cN1M0 NSCLC, squamous cell carcinoma of the LUL   Interval Since Last Radiation:  6 weeks   02/20/2021 through 04/10/2021 Site Technique Total Dose (Gy) Dose per Fx (Gy) Completed Fx Beam Energies  Lung, Left: Lung_Lt 3D 60/60 2 30/30 6X, 10X, 15X  Lung, Left: Lung_Lt_Bst 3D 6/6 2 3/3 6X, 10X, 15X    Narrative:  The patient was contacted today for routine follow-up. During treatment she did very well with radiotherapy and did not have significant desquamation. Her husband answered and reports she is doing fairly well. She was quite tired from treatments but this is improving. She is eating and swallowing well without pain now and continues with 3L O2 [place].  Impression/Plan: 1. Stage IIB, cT1cN1M0 NSCLC, squamous cell carcinoma of the LUL . The patient has been doing well since completion of radiotherapy. We discussed that we would be happy to continue to follow her as needed, but she will also continue to follow up with Dr. Julien Nordmann in medical oncology.         Carola Rhine, PAC

## 2021-06-08 DIAGNOSIS — G4733 Obstructive sleep apnea (adult) (pediatric): Secondary | ICD-10-CM | POA: Diagnosis not present

## 2021-06-09 ENCOUNTER — Ambulatory Visit: Payer: Medicare Other | Admitting: Internal Medicine

## 2021-06-11 DIAGNOSIS — L400 Psoriasis vulgaris: Secondary | ICD-10-CM | POA: Diagnosis not present

## 2021-06-11 DIAGNOSIS — G4733 Obstructive sleep apnea (adult) (pediatric): Secondary | ICD-10-CM | POA: Diagnosis not present

## 2021-06-11 DIAGNOSIS — J449 Chronic obstructive pulmonary disease, unspecified: Secondary | ICD-10-CM | POA: Diagnosis not present

## 2021-06-11 DIAGNOSIS — T85511A Breakdown (mechanical) of esophageal anti-reflux device, initial encounter: Secondary | ICD-10-CM | POA: Diagnosis not present

## 2021-06-14 ENCOUNTER — Encounter: Payer: Medicare Other | Admitting: Internal Medicine

## 2021-06-19 DIAGNOSIS — L400 Psoriasis vulgaris: Secondary | ICD-10-CM | POA: Diagnosis not present

## 2021-06-19 DIAGNOSIS — Z9225 Personal history of immunosupression therapy: Secondary | ICD-10-CM | POA: Diagnosis not present

## 2021-06-19 DIAGNOSIS — L409 Psoriasis, unspecified: Secondary | ICD-10-CM | POA: Diagnosis not present

## 2021-07-03 ENCOUNTER — Ambulatory Visit: Payer: Medicare Other | Admitting: Physician Assistant

## 2021-07-03 ENCOUNTER — Encounter: Payer: Self-pay | Admitting: Physician Assistant

## 2021-07-03 VITALS — BP 119/62 | HR 78 | Ht 65.0 in | Wt 207.0 lb

## 2021-07-03 DIAGNOSIS — Z9981 Dependence on supplemental oxygen: Secondary | ICD-10-CM | POA: Diagnosis not present

## 2021-07-03 DIAGNOSIS — C3412 Malignant neoplasm of upper lobe, left bronchus or lung: Secondary | ICD-10-CM | POA: Diagnosis not present

## 2021-07-03 DIAGNOSIS — Z1211 Encounter for screening for malignant neoplasm of colon: Secondary | ICD-10-CM

## 2021-07-03 MED ORDER — NA SULFATE-K SULFATE-MG SULF 17.5-3.13-1.6 GM/177ML PO SOLN: 1.0000 | Freq: Once | ORAL | 0 refills | Status: AC

## 2021-07-03 NOTE — Patient Instructions (Signed)
**Note De-Identified Wollenberg Obfuscation** You have been scheduled for a colonoscopy. Please follow written instructions given to you at your visit today.  Please pick up your prep supplies at the pharmacy within the next 1-3 days. If you use inhalers (even only as needed), please bring them with you on the day of your procedure.  If you are age 67 or older, your body mass index should be between 23-30. Your Body mass index is 34.45 kg/m. If this is out of the aforementioned range listed, please consider follow up with your Primary Care Provider.  If you are age 74 or younger, your body mass index should be between 19-25. Your Body mass index is 34.45 kg/m. If this is out of the aformentioned range listed, please consider follow up with your Primary Care Provider.   __________________________________________________________  The [facility] providers would like to encourage you to use [facility] to communicate with providers for non-urgent requests or questions.  Due to long hold times on the telephone, sending your provider a message by [facility] may be a faster and more efficient way to get a response.  Please allow 48 business hours for a response.  Please remember that this is for non-urgent requests.

## 2021-07-03 NOTE — Progress Notes (Signed)
**Note De-Identified Bodley Obfuscation** Chief Complaint: Discuss colon cancer screening and patient on oxygen  HPI:    Hailey Hernandez is a 65 year old Caucasian female, known to Dr. Henrene Pastor, with a past medical history as listed below including squamous cell carcinoma of the left upper lobe, status postradiation and chemotherapy, COPD on O2 at 3 L (01/25/2017 echo with an LVEF 55-60%), fibromyalgia and multiple others listed below, who was referred to me by [name], DO for consideration of a screening colonoscopy.    10/19/2010 colonoscopy with Dr. Henrene Pastor at [facility] with 4 polyps removed, moderate diverticulosis in the left colon, lipomas in the cecum and internal hemorrhoids.  Pathology showed hyperplastic polyps and repeat was recommended in 10 years.    05/02/2021 patient followed with Dr. Earlie Server at the cancer center and at that time it was noted that she had previous chemoradiation for 6 cycles with her last dose 03/27/2021.  She is currently under observation.  Patient was doing well at that time.    Today, the patient presents to clinic accompanied by her husband.  She tells me that she has chronic diarrhea which has maybe gotten slightly worse after her chemo/radiation.  Describes sometimes up to 3-4 loose stools a day with only occasional soft solid stools.  Also takes Colchicine once daily.  Tells me that this does not really bother her it does not awaken her from sleep and she has no abdominal pain and it really has been this way most of her life.    Denies fever, chills, weight loss or blood in her stool.  Past Medical History:  Diagnosis Date   Allergy    Anxiety disorder    Arthritis    bil legs   Asthma    Cancer ([place])    Chronic bronchitis    Complication of anesthesia    slow to wake up, one time she turned blue in recovery   COPD (chronic obstructive pulmonary disease) (HCC)    Depression    Diverticulitis    DJD (degenerative joint disease)    spine   Dyspnea    O2 at 3 L 24/7   Fibromyalgia     GERD (gastroesophageal reflux disease)    Glaucoma    History of thyroid cancer    HTN (hypertension)    Hyperlipidemia    Impaired glucose tolerance 05/08/2013   OSA (obstructive sleep apnea)    CPAP  last sleep study 8-10 yr.ag0   Polymyalgia ([place])    Pre-diabetes    Psoriasis    Rhinitis    Tobacco abuse     Past Surgical History:  Procedure Laterality Date   APPENDECTOMY     BRONCHIAL BIOPSY  01/24/2021   Procedure: BRONCHIAL BIOPSIES;  Surgeon: Garner Nash, DO;  Location: [place] [facility];  Service: Pulmonary;;   BRONCHIAL BRUSHINGS  01/24/2021   Procedure: BRONCHIAL BRUSHINGS;  Surgeon: Garner Nash, DO;  Location: [place] [facility];  Service: Pulmonary;;   BRONCHIAL NEEDLE ASPIRATION BIOPSY  01/24/2021   Procedure: BRONCHIAL NEEDLE ASPIRATION BIOPSIES;  Surgeon: Garner Nash, DO;  Location: [place] [facility];  Service: Pulmonary;;   CARPAL TUNNEL RELEASE  10/30/2011   Procedure: CARPAL TUNNEL RELEASE;  Surgeon: Wynonia Sours, MD;  Location: [place];  Service: Orthopedics;  Laterality: Right;  and mass excision   CHOLECYSTECTOMY     CHOLESTEATOMA EXCISION     right ear   left shoulder spurs     ORIF right lower leg     partial **Note De-Identified Luis Obfuscation** throidectomy     TOTAL ABDOMINAL HYSTERECTOMY     VIDEO BRONCHOSCOPY WITH ENDOBRONCHIAL NAVIGATION N/A 01/24/2021   Procedure: VIDEO BRONCHOSCOPY WITH ENDOBRONCHIAL NAVIGATION;  Surgeon: Garner Nash, DO;  Location: [place];  Service: Pulmonary;  Laterality: N/A;   VIDEO BRONCHOSCOPY WITH ENDOBRONCHIAL ULTRASOUND N/A 01/24/2021   Procedure: VIDEO BRONCHOSCOPY WITH ENDOBRONCHIAL ULTRASOUND;  Surgeon: Garner Nash, DO;  Location: [place];  Service: Pulmonary;  Laterality: N/A;    Current Outpatient Medications  Medication Sig Dispense Refill   albuterol (PROVENTIL) (2.5 MG/3ML) 0.083% nebulizer solution Take 3 mLs (2.5 mg total) by nebulization every 4 (four) hours as needed for wheezing or shortness of breath. DX: 496  180 mL 12   albuterol (VENTOLIN HFA) 108 (90 Base) MCG/ACT inhaler Inhale 2 puffs every 4-6 hors as needed- rescue 18 g 12   alendronate (FOSAMAX) 70 MG tablet Take 1 tablet (70 mg total) by mouth every 7 (seven) days. Take with a full glass of water on an empty stomach. (Patient taking differently: Take 70 mg by mouth every 7 (seven) days. Take with a full glass of water on an empty stomach. Sunday) 4 tablet 11   aspirin EC 81 MG tablet Take 81 mg by mouth daily.     atorvastatin (LIPITOR) 80 MG tablet TAKE 1 TABLET ONCE DAILY 90 tablet 0   Budeson-Glycopyrrol-Formoterol (BREZTRI AEROSPHERE) 160-9-4.8 MCG/ACT AERO Inhale 2 puffs into the lungs 2 (two) times daily. 10.7 g 5   calcium carbonate (CALTRATE 600) 1500 (600 Ca) MG TABS tablet Take 1 tablet (1,500 mg total) by mouth 2 (two) times daily with a meal. 60 tablet 3   colchicine 0.6 MG tablet TAKE 1 TABLET ONCE DAILY 30 tablet 3   FLUoxetine (PROZAC) 20 MG capsule TAKE 1 CAPSULE ONCE DAILY 90 capsule 0   fluticasone (FLONASE) 50 MCG/ACT nasal spray Place 2 sprays into both nostrils daily. 16 g 6   lisinopril (ZESTRIL) 20 MG tablet TAKE 1 TABLET BY MOUTH DAILY. 90 tablet 0   montelukast (SINGULAIR) 10 MG tablet TAKE 1 TABLET EVERY DAY 90 tablet 0   Nebulizers (COMPRESSOR/NEBULIZER) MISC Use up to 4 times daily when needed 1 each 0   Respiratory Therapy Supplies (FLUTTER) DEVI Patient to use device for ten minutes, three times daily 1 each 0   No current facility-administered medications for this visit.    Allergies as of 07/03/2021 - Review Complete 07/03/2021  Allergen Reaction Noted   Doxycycline Shortness Of Breath and Other (See Comments) 12/04/2013   Tramadol Shortness Of Breath 04/25/2012   Milnacipran     Apremilast Rash 01/18/2017    Family History  Problem Relation Age of Onset   Emphysema Sister    Hypertension Sister    Heart attack Sister    Emphysema Sister    Alpha-1 antitrypsin deficiency Sister    Alpha-1  antitrypsin deficiency Sister    Allergies Sister    Asthma Sister    Heart disease Mother    Cancer Mother    Hyperlipidemia Mother    Hypertension Mother    Heart attack Mother    Heart disease Father    Heart attack Father    Cancer Sister    Cancer Brother    Hyperlipidemia Brother    Hypertension Brother    Tuberculosis Other        grandmother   Hyperlipidemia Daughter    Hypertension Daughter    Hypertension Son     Social History   Socioeconomic History **Note De-Identified Sesma Obfuscation** Marital status: Married    Spouse name: Not on file   Number of children: 3   Years of education: 9   Highest education level: Not on file  Occupational History   Occupation: disabled    Employer: DISABLED  Tobacco Use   Smoking status: Every Day    Packs/day: 1.50    Years: 45.00    Pack years: 67.50    Types: Cigarettes   Smokeless tobacco: Never  Vaping Use   Vaping Use: Never used  Substance and Sexual Activity   Alcohol use: No   Drug use: No   Sexual activity: Not on file  Other Topics Concern   Not on file  Social History Narrative   Married w/ children   Daily caffeine use   Social Determinants of Health   Financial Resource Strain: Not on file  Food Insecurity: Not on file  Transportation Needs: Not on file  Physical Activity: Not on file  Stress: Not on file  Social Connections: Not on file  Intimate Partner Violence: Not on file    Review of Systems:    Constitutional: No weight loss, fever or chills Skin: No rash  Cardiovascular: No chest pain  Respiratory: Chronic SOB on O2 Gastrointestinal: See HPI and otherwise negative Genitourinary: No dysuria or change in urinary frequency Neurological: No headache, dizziness or syncope Musculoskeletal: No new muscle or joint pain Hematologic: No bleeding  Psychiatric: No history of depression or anxiety   Physical Exam:  Vital signs: BP 119/62   Pulse 78   Ht 5\' 5"  (1.651 m)   Wt 207 lb (93.9 kg)   SpO2 98%   BMI 34.45 kg/m     Constitutional:   Pleasant overweight Caucasian female appears to be in NAD, Well developed, Well nourished, alert and cooperative Head:  Normocephalic and atraumatic. Eyes:   PEERL, EOMI. No icterus. Conjunctiva pink. Ears:  Normal auditory acuity. Neck:  Supple Throat: Oral cavity and pharynx without inflammation, swelling or lesion.  Respiratory: Respirations even and unlabored. Lungs clear to auscultation bilaterally.   No wheezes, crackles, or rhonchi. On O2 [place] Cardiovascular: Normal S1, S2. No MRG. Regular rate and rhythm. No peripheral edema, cyanosis or pallor.  Gastrointestinal:  Soft, nondistended, nontender. No rebound or guarding. Normal bowel sounds. No appreciable masses or hepatomegaly. Rectal:  Not performed.  Msk:  Symmetrical without gross deformities. Without edema, no deformity or joint abnormality.  Neurologic:  Alert and  oriented x4;  grossly normal neurologically.  Skin:   Dry and intact without significant lesions or rashes. Psychiatric: Demonstrates good judgement and reason without abnormal affect or behaviors.  RELEVANT LABS AND IMAGING: CBC    Component Value Date/Time   WBC 4.0 05/03/2021 1605   WBC 3.9 (L) 04/27/2021 1434   WBC 9.1 06/07/2018 2305   RBC 3.09 (L) 05/03/2021 1605   RBC 3.14 (L) 04/27/2021 1434   HGB 10.3 (L) 05/03/2021 1605   HGB 16.6 (H) 08/26/2015 1027   HCT 31.6 (L) 05/03/2021 1605   HCT 50.5 (H) 08/26/2015 1027   PLT 93 (LL) 05/03/2021 1605   MCV 102 (H) 05/03/2021 1605   MCV 101.9 (H) 08/26/2015 1027   MCH 33.3 (H) 05/03/2021 1605   MCH 33.8 04/27/2021 1434   MCHC 32.6 05/03/2021 1605   MCHC 31.5 04/27/2021 1434   RDW 14.0 05/03/2021 1605   RDW 17.0 (H) 08/26/2015 1027   LYMPHSABS 0.9 05/03/2021 1605   LYMPHSABS 1.6 08/26/2015 1027   MONOABS 0.4 04/27/2021 1434 **Note De-Identified Scheidt Obfuscation** MONOABS 0.6 08/26/2015 1027   EOSABS 0.2 05/03/2021 1605   BASOSABS 0.0 05/03/2021 1605   BASOSABS 0.1 08/26/2015 1027    CMP     Component Value  Date/Time   NA 143 05/03/2021 1605   NA 142 08/26/2015 1027   K 4.5 05/03/2021 1605   K 4.3 08/26/2015 1027   CL 104 05/03/2021 1605   CO2 30 (H) 05/03/2021 1605   CO2 34 (H) 08/26/2015 1027   GLUCOSE 114 (H) 05/03/2021 1605   GLUCOSE 140 (H) 04/27/2021 1434   GLUCOSE 161 (H) 08/26/2015 1027   BUN 17 05/03/2021 1605   BUN 9.6 08/26/2015 1027   CREATININE 0.83 05/03/2021 1605   CREATININE 1.12 (H) 04/27/2021 1434   CREATININE 0.7 08/26/2015 1027   CALCIUM 8.4 (L) 05/03/2021 1605   CALCIUM 9.5 08/26/2015 1027   PROT 6.5 05/03/2021 1605   PROT 7.3 08/26/2015 1027   ALBUMIN 3.4 (L) 05/03/2021 1605   ALBUMIN 3.3 (L) 08/26/2015 1027   AST 32 05/03/2021 1605   AST 29 04/27/2021 1434   AST 15 08/26/2015 1027   ALT 17 05/03/2021 1605   ALT 16 04/27/2021 1434   ALT 15 08/26/2015 1027   ALKPHOS 156 (H) 05/03/2021 1605   ALKPHOS 131 08/26/2015 1027   BILITOT 0.3 05/03/2021 1605   BILITOT 0.5 04/27/2021 1434   BILITOT 0.66 08/26/2015 1027   GFRNONAA 54 (L) 04/27/2021 1434   GFRAA 77 08/05/2020 1502    Assessment: 1.  Screening for colorectal cancer: Last colonoscopy 10 years ago with hyperplastic polyps, repeat recommended in 10 years 2.  Chronic oxygen use 3.  Squamous cell carcinoma of the lung: Status post chemoradiation  Plan: 1.  She would like to have 1 more colonoscopy.  Discussed that this if this is normal she may not need to have another.  This is scheduled in the hospital given her chronic oxygen use, with Dr. Henrene Pastor.  Did provide the patient a detailed list of risks of procedure and she agrees to proceed.  Did discuss that she is higher risk for this procedure given her oxygen use. 2.  Patient to follow in clinic per recommendations from Dr. Henrene Pastor after time of procedure.  Ellouise Newer, PA-C [facility] 07/03/2021, 2:37 PM  Cc: [name], DO

## 2021-07-04 NOTE — Progress Notes (Signed)
**Note De-Identified [place] Obfuscation** Anderson Malta, I appreciate your note and have thoroughly reviewed her chart. This patient has advanced lung disease and very recent cancer treatment. She would be at very high risk for colonoscopy and is a low risk for colorectal neoplasia with negative index exam 10 years ago. In light of this, I am not willing to offer routine follow up colon cancer screening. Please let the patient and her husband know. I am willing to reassess her clinical status in the office in one year. Thanks for seeing her. Dr. Henrene Pastor

## 2021-07-05 ENCOUNTER — Telehealth: Payer: Self-pay

## 2021-07-05 NOTE — Telephone Encounter (Signed)
**Note De-Identified Kaigler Obfuscation** Spoke with patient's husband in regards to Dr. Blanch Media recommendations. He is aware that we have cancelled patient's upcoming colonoscopy that was scheduled at [facility] and Dr.. Henrene Pastor would like to reassess patient in 1 year. Pt's husband has been advised to let us know if they have any concerns in  the interim. Pt's husband verbalized understanding of all information and had no concerns at the end of the call.  1 year office recall in epic.

## 2021-07-05 NOTE — Telephone Encounter (Signed)
**Note De-identified [place] Obfuscation** " **Note De-Identified Street[place] Obfuscation** Anderson Malta, I appreciate your note and have thoroughly reviewed her chart. This patient has advanced lung disease and very recent cancer treatment. She would be at very high risk for colonoscopy and is a low risk for colorectal neoplasia with negative index exam 10 years ago. In light of this, I am not willing to offer routine follow up colon cancer screening. Please let the patient and her husband know. I am willing to reassess her clinical status in the office in one year. Thanks for seeing her. Dr. Henrene Pastor "

## 2021-07-05 NOTE — Telephone Encounter (Signed)
**Note De-identified Hardrick Obfuscation** -----  **Note De-Identified Reindel Obfuscation** Message from Levin Erp, [place] sent at 07/04/2021  4:18 PM EDT ----- Regarding: Cancel colonoscopy Please let the patient know that Dr. Henrene Pastor reviewed her records and does not believe she needs a colonoscopy right now.  He is willing to reassess in a year if she would like.  Please cancel procedure.  Thanks, Thomasenia Bottoms ----- Message ----- From: Irene Shipper, MD Sent: 07/04/2021   3:17 PM EDT To: Levin Erp, PA     ----- Message ----- From: Levin Erp, [place] Sent: 07/03/2021   3:08 PM EDT To: Irene Shipper, MD

## 2021-07-08 ENCOUNTER — Other Ambulatory Visit: Payer: Self-pay | Admitting: Family Medicine

## 2021-07-08 DIAGNOSIS — J9611 Chronic respiratory failure with hypoxia: Secondary | ICD-10-CM

## 2021-07-08 DIAGNOSIS — J410 Simple chronic bronchitis: Secondary | ICD-10-CM

## 2021-07-10 DIAGNOSIS — H10413 Chronic giant papillary conjunctivitis, bilateral: Secondary | ICD-10-CM | POA: Diagnosis not present

## 2021-07-10 DIAGNOSIS — E119 Type 2 diabetes mellitus without complications: Secondary | ICD-10-CM | POA: Diagnosis not present

## 2021-07-10 DIAGNOSIS — Z961 Presence of intraocular lens: Secondary | ICD-10-CM | POA: Diagnosis not present

## 2021-07-10 DIAGNOSIS — H348111 Central retinal vein occlusion, right eye, with retinal neovascularization: Secondary | ICD-10-CM | POA: Diagnosis not present

## 2021-07-10 LAB — HM DIABETES EYE EXAM

## 2021-07-11 ENCOUNTER — Telehealth: Payer: Self-pay | Admitting: Internal Medicine

## 2021-07-11 DIAGNOSIS — L400 Psoriasis vulgaris: Secondary | ICD-10-CM | POA: Diagnosis not present

## 2021-07-11 DIAGNOSIS — T85511A Breakdown (mechanical) of esophageal anti-reflux device, initial encounter: Secondary | ICD-10-CM | POA: Diagnosis not present

## 2021-07-11 DIAGNOSIS — G4733 Obstructive sleep apnea (adult) (pediatric): Secondary | ICD-10-CM | POA: Diagnosis not present

## 2021-07-11 DIAGNOSIS — J449 Chronic obstructive pulmonary disease, unspecified: Secondary | ICD-10-CM | POA: Diagnosis not present

## 2021-07-11 NOTE — Telephone Encounter (Signed)
**Note De-Identified Penrod Obfuscation** Rescheduled per 11/1, message left on pts voicemail

## 2021-07-20 ENCOUNTER — Ambulatory Visit (HOSPITAL_COMMUNITY): Admit: 2021-07-20 | Payer: Medicare Other | Admitting: Internal Medicine

## 2021-07-20 ENCOUNTER — Encounter (HOSPITAL_COMMUNITY): Payer: Self-pay

## 2021-07-20 SURGERY — COLONOSCOPY WITH PROPOFOL
Anesthesia: Monitor Anesthesia Care

## 2021-07-31 ENCOUNTER — Inpatient Hospital Stay: Payer: Medicare Other

## 2021-08-02 ENCOUNTER — Ambulatory Visit: Payer: Medicare Other | Admitting: Internal Medicine

## 2021-08-03 ENCOUNTER — Telehealth: Payer: Self-pay | Admitting: Family Medicine

## 2021-08-03 ENCOUNTER — Other Ambulatory Visit: Payer: Self-pay

## 2021-08-03 ENCOUNTER — Ambulatory Visit (HOSPITAL_COMMUNITY)
Admission: RE | Admit: 2021-08-03 | Discharge: 2021-08-03 | Disposition: A | Discharge status: Home / Self Care | Payer: Medicare Other | Source: Ambulatory Visit | Attending: Internal Medicine | Admitting: Internal Medicine

## 2021-08-03 ENCOUNTER — Inpatient Hospital Stay: Payer: Medicare Other | Attending: Internal Medicine

## 2021-08-03 DIAGNOSIS — Z881 Allergy status to other antibiotic agents status: Secondary | ICD-10-CM | POA: Insufficient documentation

## 2021-08-03 DIAGNOSIS — R911 Solitary pulmonary nodule: Secondary | ICD-10-CM | POA: Diagnosis not present

## 2021-08-03 DIAGNOSIS — R0609 Other forms of dyspnea: Secondary | ICD-10-CM | POA: Insufficient documentation

## 2021-08-03 DIAGNOSIS — C3490 Malignant neoplasm of unspecified part of unspecified bronchus or lung: Secondary | ICD-10-CM | POA: Insufficient documentation

## 2021-08-03 DIAGNOSIS — Z923 Personal history of irradiation: Secondary | ICD-10-CM | POA: Insufficient documentation

## 2021-08-03 DIAGNOSIS — I700 Atherosclerosis of aorta: Secondary | ICD-10-CM | POA: Insufficient documentation

## 2021-08-03 DIAGNOSIS — D649 Anemia, unspecified: Secondary | ICD-10-CM | POA: Insufficient documentation

## 2021-08-03 DIAGNOSIS — Z888 Allergy status to other drugs, medicaments and biological substances status: Secondary | ICD-10-CM | POA: Insufficient documentation

## 2021-08-03 DIAGNOSIS — F419 Anxiety disorder, unspecified: Secondary | ICD-10-CM | POA: Insufficient documentation

## 2021-08-03 DIAGNOSIS — L409 Psoriasis, unspecified: Secondary | ICD-10-CM | POA: Insufficient documentation

## 2021-08-03 DIAGNOSIS — R0602 Shortness of breath: Secondary | ICD-10-CM | POA: Insufficient documentation

## 2021-08-03 DIAGNOSIS — I10 Essential (primary) hypertension: Secondary | ICD-10-CM | POA: Insufficient documentation

## 2021-08-03 DIAGNOSIS — C3412 Malignant neoplasm of upper lobe, left bronchus or lung: Secondary | ICD-10-CM | POA: Insufficient documentation

## 2021-08-03 DIAGNOSIS — R590 Localized enlarged lymph nodes: Secondary | ICD-10-CM | POA: Insufficient documentation

## 2021-08-03 DIAGNOSIS — Z885 Allergy status to narcotic agent status: Secondary | ICD-10-CM | POA: Insufficient documentation

## 2021-08-03 DIAGNOSIS — R5383 Other fatigue: Secondary | ICD-10-CM | POA: Insufficient documentation

## 2021-08-03 DIAGNOSIS — Z9049 Acquired absence of other specified parts of digestive tract: Secondary | ICD-10-CM | POA: Insufficient documentation

## 2021-08-03 DIAGNOSIS — Z79899 Other long term (current) drug therapy: Secondary | ICD-10-CM | POA: Insufficient documentation

## 2021-08-03 DIAGNOSIS — J449 Chronic obstructive pulmonary disease, unspecified: Secondary | ICD-10-CM | POA: Insufficient documentation

## 2021-08-03 DIAGNOSIS — M47814 Spondylosis without myelopathy or radiculopathy, thoracic region: Secondary | ICD-10-CM | POA: Insufficient documentation

## 2021-08-03 DIAGNOSIS — G4733 Obstructive sleep apnea (adult) (pediatric): Secondary | ICD-10-CM | POA: Insufficient documentation

## 2021-08-03 LAB — CBC WITH DIFFERENTIAL (CANCER CENTER ONLY)
Abs Immature Granulocytes: 0.01 10*3/uL (ref 0.00–0.07)
Basophils Absolute: 0 10*3/uL (ref 0.0–0.1)
Basophils Relative: 0 %
Eosinophils Absolute: 0.3 10*3/uL (ref 0.0–0.5)
Eosinophils Relative: 5 %
HCT: 35.2 % — ABNORMAL LOW (ref 36.0–46.0)
Hemoglobin: 10.8 g/dL — ABNORMAL LOW (ref 12.0–15.0)
Immature Granulocytes: 0 %
Lymphocytes Relative: 17 %
Lymphs Abs: 1 10*3/uL (ref 0.7–4.0)
MCH: 30.9 pg (ref 26.0–34.0)
MCHC: 30.7 g/dL (ref 30.0–36.0)
MCV: 100.6 fL — ABNORMAL HIGH (ref 80.0–100.0)
Monocytes Absolute: 0.5 10*3/uL (ref 0.1–1.0)
Monocytes Relative: 10 %
Neutro Abs: 3.8 10*3/uL (ref 1.7–7.7)
Neutrophils Relative %: 68 %
Platelet Count: 114 10*3/uL — ABNORMAL LOW (ref 150–400)
RBC: 3.5 MIL/uL — ABNORMAL LOW (ref 3.87–5.11)
RDW: 14 % (ref 11.5–15.5)
WBC Count: 5.6 10*3/uL (ref 4.0–10.5)
nRBC: 0 % (ref 0.0–0.2)

## 2021-08-03 LAB — CMP (CANCER CENTER ONLY)
ALT: 16 U/L (ref 0–44)
AST: 29 U/L (ref 15–41)
Albumin: 3.1 g/dL — ABNORMAL LOW (ref 3.5–5.0)
Alkaline Phosphatase: 126 U/L (ref 38–126)
Anion gap: 8 (ref 5–15)
BUN: 21 mg/dL (ref 8–23)
CO2: 31 mmol/L (ref 22–32)
Calcium: 8.8 mg/dL — ABNORMAL LOW (ref 8.9–10.3)
Chloride: 102 mmol/L (ref 98–111)
Creatinine: 1.32 mg/dL — ABNORMAL HIGH (ref 0.44–1.00)
GFR, Estimated: 44 mL/min — ABNORMAL LOW (ref >60)
Glucose, Bld: 84 mg/dL (ref 70–99)
Potassium: 4.6 mmol/L (ref 3.5–5.1)
Sodium: 141 mmol/L (ref 135–145)
Total Bilirubin: 0.4 mg/dL (ref 0.3–1.2)
Total Protein: 7.6 g/dL (ref 6.5–8.1)

## 2021-08-03 MED ORDER — IOHEXOL 350 MG/ML SOLN
80.0000 mL | Freq: Once | INTRAVENOUS | Status: AC | PRN
  Administered 2021-08-03: 80 mL via INTRAVENOUS

## 2021-08-03 NOTE — Progress Notes (Signed)
**Note De-Identified [name]** Patient ID: Hailey Hernandez, female    DOB: 1956-06-09, 65 y.o.   MRN: [id number]  HPI female smoker followed for chronic bronchitis/COPD, chronic hypoxic respiratory failure, tobacco use (1 PPD/45 pack years), OSA, obesity hypoventilation, complicated by psoriasis, Squamous Cell CA lung 2022, Cirrhosis, Multi[ple Lung Nodules, SqCell Lung CA,  O2 2-3 L sleep/exertion, CPAP 13/[facility] Office Spirometry-06/07/17-severe obstructive airways disease, severe restriction of exhaled volume. FVC 1.18/37%, FEV1 0.90/36%, ratio 0.76, FEF 25-75% 0.73/33% CT chest 12/30/20- Cavitary LUL nodule and multiple small nodules Navigation Bronchoscopy Dr Valeta Harms 01/24/21- for LUL nodule met to L hilum by PET -----------------------------------------------------------------------------------------  02/01/21-  65 year old female smoker  followed for chronic bronchitis/COPD, chronic hypoxic respiratory failure, Tobacco use (1 PPD/45 pack years), OSA, obesity hypoventilation, complicated by psoriasis, DM2, SCLung Ca, Cirrhosis, lung nodules,  -Flutter device, Symbicort 160, albuterol hfa, neb albuterol -O2 2-3 L sleep/exertion,  CPAP 5-15/[facility]                                                 Husband here Download compliance 100%, AHI 0.8/ hr Body weight today  Covid vax- 1J&J, 1 Moderna Navigation Bronchoscopy Dr Valeta Harms 01/24/21- for LUL nodule met to L hilum by PET -----CPAP going well. Sob-same, cough light yellow,occass. Wheezing CT did rule out ILD. I reassured her she will be in good hands for her lung cancer. She admits being scared.  Breathing is stable for her with chronic cough.  She preferred Breztri samples over Symbicort. She and husband are finishing a last pack of cigarettes, then plan to quit together. CT chest 4/122-  IMPRESSION: 1. No findings to suggest interstitial lung disease. 2. Numerous pulmonary nodules scattered throughout the lungs bilaterally concerning for potential metastatic disease,  although atypical infection is difficult to exclude. The largest of these is a cavitary left upper lobe nodule measuring 2.7 x 2.0 x 2.2 cm. Further clinical evaluation is recommended. Follow-up PET-CT should be considered if clinically appropriate. 3. Dilatation of the pulmonic trunk (3.5 cm in diameter), concerning for pulmonary arterial hypertension. 4. Probable cirrhosis with hepatic steatosis and hepatosplenomegaly. 5. Cardiomegaly. 6. There are calcifications of the aortic valve and mitral annulus. Echocardiographic correlation for evaluation of potential valvular dysfunction may be warranted if clinically indicated. 7. Aortic atherosclerosis, in addition to left main and 3 vessel coronary artery disease. Please note that although the presence of coronary artery calcium documents the presence of coronary artery disease, the severity of this disease and any potential stenosis cannot be assessed on this non-gated CT examination. Assessment for potential risk factor modification, dietary therapy or pharmacologic therapy may be warranted, if clinically indicated.  PET 01/11/21-  IMPRESSION: 1. The dominant cavitary lesion in the left upper lobe is significantly hypermetabolic, most consistent with primary bronchogenic carcinoma. There is a single hypermetabolic left hilar lymph node. By this examination, findings are consistent with IIB disease (T1c N1 M0). 2. The multiple other smaller, ill-defined nodules in both lungs are  grossly stable, without hypermetabolic activity. Although suboptimally evaluated based on size, the morphology of these nodules on prior CT favors an atypical inflammatory process such as atypical mycobacterial infection. Attention on follow-up recommended. 3. No evidence of metastatic disease in the abdomen or pelvis. 4. Morphologic changes of cirrhosis with mild splenomegaly. 5. Carotid, coronary artery and Aortic Atherosclerosis (ICD10-I70.0).  08/04/21-  65 year old female smoker **Note De-Identified Hailey Hernandez Obfuscation** followed for chronic bronchitis/COPD, chronic hypoxic respiratory failure, Tobacco use (1 PPD/45 pack years), OSA, obesity hypoventilation, complicated by psoriasis, DM2, SCLung Ca, Cirrhosis, lung nodules, Anemia,  -Flutter device, Breztri, albuterol hfa, neb albuterol, Singulair,  -O2 3 L continuous CPAP 5-15/[facility]                                                 Husband here Download compliance 100%, AHI 1.2/ hr Body weight today  Covid vax- 1J&J, 1 Moderna Flu vax- today Completed XRT for SCCA LUL Lab- Hgb 10.8 Still smoking 1/2 PPD. Thinks a bit more short of breath but otherwise no change and no acute event. Cough productive white- no blood. Stopped smoking for a month on nicotine patches but got contact dermatitis and stopped those. Discussed pharmacy smoking cessation program. I reviewed CT but will leave it to her Oncologist to discuss with her. CT chest 08/03/21-  IMPRESSION: 1. Irregular solid 1.4 cm apical left upper lobe pulmonary nodule, slightly decreased in size since 04/27/2021 chest CT, compatible with response to therapy. New mild patchy left upper lobe and superior segment left lower lobe ground-glass opacities, favor early postradiation change. 2. New indistinct 0.5 cm nodular opacities in the peripheral right upper lobe and superior segment left lower lobe, indeterminate for inflammatory foci versus metastatic disease. Short-term follow-up chest CT recommended in 3 months. 3. Stable mild mediastinal and gastrohepatic ligament lymphadenopathy. 4. Stable dilated main pulmonary artery, suggesting chronic pulmonary arterial hypertension. 5. Three-vessel coronary atherosclerosis. 6. Liver surface appears finely irregular, cannot exclude cirrhosis. 7. Aortic Atherosclerosis (ICD10-I70.0).    Review of Systems-see HPI   + = positive Constitutional:   No-   weight loss, night sweats, fevers, chills, fatigue, lassitude. HEENT:    headaches,  difficulty swallowing, tooth/dental problems, sore throat,        sneezing, itching,  ear ache,  +nasal congestion, +post nasal drip,  CV:  No-   chest pain, orthopnea, PND, swelling in lower extremities, anasarca,   dizziness, palpitations Resp: +  shortness of breath with exertion or at rest.               +productive cough,  + non-productive cough,  No- coughing up of blood.              change in color of mucus.  + wheezing.   Skin: +psoriasis GI:  No-   heartburn, indigestion, abdominal pain, nausea, vomiting,  GU: No-    MS:  + joint pain or swelling. . Neuro-     nothing unusual Psych:  No- change in mood or affect. No depression or anxiety.  No memory loss.   Objective:   Physical Exam General- Alert, Oriented, Affect-appropriate, Distress- none acute, +obese.  On 3L POC O2 sat 96% Skin- + psoriasis plaques- flared since last visit Lymphadenopathy- none Head- atraumatic            Eyes- Gross vision intact, PERRLA, conjunctivae clear secretions            Ears- Hearing ok            Nose-  no-Septal dev, mucus, polyps, erosion, perforation             Throat- Mallampati III , mucosa clear , drainage- none, tonsils- atrophic.                        + **Note De-Identified Hailey Hernandez Obfuscation** dentures Neck- flexible , trachea midline, no stridor , thyroid nl, carotid no bruit Chest - symmetrical excursion , unlabored           Heart/CV- RRR , no murmur , no gallop  , no rub, nl s1 s2                           - JVD- none , edema- none, stasis dermatitis changes+ bilateral,                                 varices- none           Lung- +Diminished,  Wheeze- none, dullness-none,                                                    rub- none, , cough + loose           Chest wall-  Abd-  Br/ Gen/ Rectal- Not done, not indicated Extrem- +apparent lipoma right medial knee with +stasis changes,  Neuro- grossly intact to observation

## 2021-08-03 NOTE — Telephone Encounter (Signed)
**Note De-Identified [place] [facility]ation** Tried calling patient to  schedule Medicare Annual Wellness Visit (AWV) either virtually   No answer   *due 10/01/2009 awvi per [facility]  please schedule at anytime with health coach  This should be a 45 minute visit.

## 2021-08-04 ENCOUNTER — Encounter: Payer: Self-pay | Admitting: Internal Medicine

## 2021-08-04 ENCOUNTER — Ambulatory Visit: Payer: Medicare Other | Admitting: Internal Medicine

## 2021-08-04 VITALS — BP 140/60 | HR 94 | Temp 98.2°F | Ht 65.0 in | Wt 205.8 lb

## 2021-08-04 DIAGNOSIS — Z720 Tobacco use: Secondary | ICD-10-CM | POA: Diagnosis not present

## 2021-08-04 DIAGNOSIS — G4733 Obstructive sleep apnea (adult) (pediatric): Secondary | ICD-10-CM | POA: Diagnosis not present

## 2021-08-04 DIAGNOSIS — J449 Chronic obstructive pulmonary disease, unspecified: Secondary | ICD-10-CM | POA: Diagnosis not present

## 2021-08-04 DIAGNOSIS — Z23 Encounter for immunization: Secondary | ICD-10-CM | POA: Diagnosis not present

## 2021-08-04 NOTE — Assessment & Plan Note (Signed)
**Note De-Identified [place] [facility]bfuscation** Has tolerated Breztri with nebulizer Plan- continue current meds.

## 2021-08-04 NOTE — Assessment & Plan Note (Signed)
**Note De-Identified [place] [facility]ation** Benefits from CPAP with ongoing good compliance and control. O2 3L. Plan- continue auto 5-15 and O2

## 2021-08-04 NOTE — Patient Instructions (Signed)
**Note De-Identified [place] [facility]bfuscation** Order- Smoking cessation program  Order- flu vax- senior  [employer] chart- allergic nicotine patches  contact allergy rash  Please call if we can help

## 2021-08-04 NOTE — Assessment & Plan Note (Signed)
**Note De-Identified [place] [facility]bfuscation** Stopped using nicotine patches, until those gave contact dermatitis Plan- referred to pharmacy smoking cessation team.

## 2021-08-07 ENCOUNTER — Other Ambulatory Visit: Payer: Self-pay

## 2021-08-07 ENCOUNTER — Inpatient Hospital Stay (HOSPITAL_BASED_OUTPATIENT_CLINIC_OR_DEPARTMENT_OTHER): Payer: Medicare Other | Admitting: Internal Medicine

## 2021-08-07 ENCOUNTER — Encounter: Payer: Self-pay | Admitting: Internal Medicine

## 2021-08-07 VITALS — BP 125/54 | HR 83 | Temp 98.1°F | Resp 20 | Ht 65.0 in | Wt 206.7 lb

## 2021-08-07 DIAGNOSIS — G4733 Obstructive sleep apnea (adult) (pediatric): Secondary | ICD-10-CM | POA: Diagnosis not present

## 2021-08-07 DIAGNOSIS — C3490 Malignant neoplasm of unspecified part of unspecified bronchus or lung: Secondary | ICD-10-CM

## 2021-08-07 DIAGNOSIS — D649 Anemia, unspecified: Secondary | ICD-10-CM | POA: Diagnosis not present

## 2021-08-07 DIAGNOSIS — J449 Chronic obstructive pulmonary disease, unspecified: Secondary | ICD-10-CM | POA: Diagnosis not present

## 2021-08-07 DIAGNOSIS — R0609 Other forms of dyspnea: Secondary | ICD-10-CM | POA: Diagnosis not present

## 2021-08-07 DIAGNOSIS — R5383 Other fatigue: Secondary | ICD-10-CM | POA: Diagnosis not present

## 2021-08-07 DIAGNOSIS — Z79899 Other long term (current) drug therapy: Secondary | ICD-10-CM | POA: Diagnosis not present

## 2021-08-07 DIAGNOSIS — M47814 Spondylosis without myelopathy or radiculopathy, thoracic region: Secondary | ICD-10-CM | POA: Diagnosis not present

## 2021-08-07 DIAGNOSIS — Z885 Allergy status to narcotic agent status: Secondary | ICD-10-CM | POA: Diagnosis not present

## 2021-08-07 DIAGNOSIS — Z923 Personal history of irradiation: Secondary | ICD-10-CM | POA: Diagnosis not present

## 2021-08-07 DIAGNOSIS — Z881 Allergy status to other antibiotic agents status: Secondary | ICD-10-CM | POA: Diagnosis not present

## 2021-08-07 DIAGNOSIS — I700 Atherosclerosis of aorta: Secondary | ICD-10-CM | POA: Diagnosis not present

## 2021-08-07 DIAGNOSIS — I10 Essential (primary) hypertension: Secondary | ICD-10-CM | POA: Diagnosis not present

## 2021-08-07 DIAGNOSIS — C3412 Malignant neoplasm of upper lobe, left bronchus or lung: Secondary | ICD-10-CM | POA: Diagnosis not present

## 2021-08-07 DIAGNOSIS — Z888 Allergy status to other drugs, medicaments and biological substances status: Secondary | ICD-10-CM | POA: Diagnosis not present

## 2021-08-07 DIAGNOSIS — Z9049 Acquired absence of other specified parts of digestive tract: Secondary | ICD-10-CM | POA: Diagnosis not present

## 2021-08-07 DIAGNOSIS — F419 Anxiety disorder, unspecified: Secondary | ICD-10-CM | POA: Diagnosis not present

## 2021-08-07 DIAGNOSIS — R0602 Shortness of breath: Secondary | ICD-10-CM | POA: Diagnosis not present

## 2021-08-07 DIAGNOSIS — L409 Psoriasis, unspecified: Secondary | ICD-10-CM | POA: Diagnosis not present

## 2021-08-07 DIAGNOSIS — R590 Localized enlarged lymph nodes: Secondary | ICD-10-CM | POA: Diagnosis not present

## 2021-08-07 NOTE — Progress Notes (Signed)
**Note De-Identified [place]bfuscation** [place] Telephone:([phone]) [phone]   Fax:([phone]) [phone]  OFFICE PROGRESS NOTE  [name], DO [place] [address]  DIAGNOSIS: Stage IIb (T1c, N1, M0) non-small cell lung cancer, squamous cell carcinoma presented with left upper lobe lung nodule in addition to left hilar adenopathy diagnosed in April 2022.   PRIOR THERAPY: Concurrent chemoradiation with weekly carboplatin for AUC of 2 and paclitaxel 45 Mg/M2 for 6-7 weeks. First dose on 02/21/21. Status post 6 cycles.  Last dose was given on March 27, 2021.  The patient is not a candidate for consolidation immunotherapy because of the extensive psoriasis.   CURRENT THERAPY: Observation.  INTERVAL HISTORY: Hailey Hernandez 65 y.o. female returns to the clinic today for follow-up visit accompanied by her husband.  The patient is feeling fine today with no concerning complaints except for the baseline shortness of breath and fatigue.  She denied having any cough or hemoptysis but she had intermittent left-sided chest pain few weeks ago and it is getting better.  She has no nausea, vomiting, diarrhea or constipation.  She has no headache or visual changes.  She has no fever or chills.  The patient had repeat CT scan of the chest performed recently and she is here for evaluation and discussion of her scan results.   MEDICAL HISTORY: Past Medical History:  Diagnosis Date   Allergy    Anxiety disorder    Arthritis    bil legs   Asthma    Cancer ([place])    Chronic bronchitis    Complication of anesthesia    slow to wake up, one time she turned blue in recovery   COPD (chronic obstructive pulmonary disease) (HCC)    Depression    Diverticulitis    DJD (degenerative joint disease)    spine   Dyspnea    O2 at 3 L 24/7   Fibromyalgia    GERD (gastroesophageal reflux disease)    Glaucoma    History of thyroid cancer    HTN (hypertension)    Hyperlipidemia    Impaired glucose tolerance 05/08/2013    Lung cancer (HCC)    OSA (obstructive sleep apnea)    CPAP  last sleep study 8-10 yr.ag0   Polymyalgia ([place])    Pre-diabetes    Psoriasis    Rhinitis    Tobacco abuse     ALLERGIES:  is allergic to doxycycline, tramadol, milnacipran, and apremilast.  MEDICATIONS:  Current Outpatient Medications  Medication Sig Dispense Refill   albuterol (PROVENTIL) (2.5 MG/3ML) 0.083% nebulizer solution Take 3 mLs (2.5 mg total) by nebulization every 4 (four) hours as needed for wheezing or shortness of breath. DX: 496 180 mL 12   albuterol (VENTOLIN HFA) 108 (90 Base) MCG/ACT inhaler Inhale 2 puffs every 4-6 hors as needed- rescue 18 g 12   alendronate (FOSAMAX) 70 MG tablet Take 1 tablet (70 mg total) by mouth every 7 (seven) days. Take with a full glass of water on an empty stomach. (Patient taking differently: Take 70 mg by mouth every 7 (seven) days. Take with a full glass of water on an empty stomach. Sunday) 4 tablet 11   aspirin EC 81 MG tablet Take 81 mg by mouth daily.     atorvastatin (LIPITOR) 80 MG tablet TAKE 1 TABLET ONCE DAILY 90 tablet 0   Budeson-Glycopyrrol-Formoterol (BREZTRI AEROSPHERE) 160-9-4.8 MCG/ACT AERO Inhale 2 puffs into the lungs 2 (two) times daily. 10.7 g 5 **Note De-Identified [place] Obfuscation** calcium carbonate (CALTRATE 600) 1500 (600 Ca) MG TABS tablet Take 1 tablet (1,500 mg total) by mouth 2 (two) times daily with a meal. 60 tablet 3   colchicine 0.6 MG tablet TAKE 1 TABLET ONCE DAILY 30 tablet 3   FLUoxetine (PROZAC) 20 MG capsule TAKE 1 CAPSULE ONCE DAILY 90 capsule 0   fluticasone (FLONASE) 50 MCG/ACT nasal spray Place 2 sprays into both nostrils daily. 16 g 6   lisinopril (ZESTRIL) 20 MG tablet TAKE 1 TABLET BY MOUTH DAILY. 90 tablet 0   montelukast (SINGULAIR) 10 MG tablet TAKE 1 TABLET EVERY DAY 90 tablet 0   Nebulizers (COMPRESSOR/NEBULIZER) MISC Use up to 4 times daily when needed 1 each 0   Respiratory Therapy Supplies (FLUTTER) DEVI Patient to use device for ten minutes, three times daily  1 each 0   triamcinolone ointment (KENALOG) 0.1 % Apply topically 3 (three) times daily.     No current facility-administered medications for this visit.    SURGICAL HISTORY:  Past Surgical History:  Procedure Laterality Date   APPENDECTOMY     BRONCHIAL BIOPSY  01/24/2021   Procedure: BRONCHIAL BIOPSIES;  Surgeon: Garner Nash, DO;  Location: [facility];  Service: Pulmonary;;   BRONCHIAL BRUSHINGS  01/24/2021   Procedure: BRONCHIAL BRUSHINGS;  Surgeon: Garner Nash, DO;  Location: [facility];  Service: Pulmonary;;   BRONCHIAL NEEDLE ASPIRATION BIOPSY  01/24/2021   Procedure: BRONCHIAL NEEDLE ASPIRATION BIOPSIES;  Surgeon: Garner Nash, DO;  Location: [facility];  Service: Pulmonary;;   CARPAL TUNNEL RELEASE  10/30/2011   Procedure: CARPAL TUNNEL RELEASE;  Surgeon: Wynonia Sours, MD;  Location: [facility];  Service: Orthopedics;  Laterality: Right;  and mass excision   CHOLECYSTECTOMY     CHOLESTEATOMA EXCISION     right ear   left shoulder spurs     ORIF right lower leg     partial throidectomy     TOTAL ABDOMINAL HYSTERECTOMY     VIDEO BRONCHOSCOPY WITH ENDOBRONCHIAL NAVIGATION N/A 01/24/2021   Procedure: VIDEO BRONCHOSCOPY WITH ENDOBRONCHIAL NAVIGATION;  Surgeon: Garner Nash, DO;  Location: [facility];  Service: Pulmonary;  Laterality: N/A;   VIDEO BRONCHOSCOPY WITH ENDOBRONCHIAL ULTRASOUND N/A 01/24/2021   Procedure: VIDEO BRONCHOSCOPY WITH ENDOBRONCHIAL ULTRASOUND;  Surgeon: Garner Nash, DO;  Location: [facility];  Service: Pulmonary;  Laterality: N/A;    REVIEW OF SYSTEMS:  A comprehensive review of systems was negative except for: Constitutional: positive for fatigue Respiratory: positive for dyspnea on exertion   PHYSICAL EXAMINATION: General appearance: alert, cooperative, fatigued, and no distress Head: Normocephalic, without obvious abnormality, atraumatic Neck: no adenopathy, no JVD, supple, symmetrical, trachea midline, and  thyroid not enlarged, symmetric, no tenderness/mass/nodules Lymph nodes: Cervical, supraclavicular, and axillary nodes normal. Resp: clear to auscultation bilaterally Back: negative, symmetric, no curvature. ROM normal. No CVA tenderness. Cardio: regular rate and rhythm, S1, S2 normal, no murmur, click, rub or gallop GI: soft, non-tender; bowel sounds normal; no masses,  no organomegaly Extremities: extremities normal, atraumatic, no cyanosis or edemaand mild tenderness to palpation on the left calf.  ECOG PERFORMANCE STATUS: 1 - Symptomatic but completely ambulatory  Blood pressure (!) 125/54, pulse 83, temperature 98.1 F (36.7 C), temperature source Tympanic, resp. rate 20, height 5\' 5"  (1.651 m), weight 206 lb 11.2 oz (93.8 kg), SpO2 95 %.  LABORATORY DATA: Lab Results  Component Value Date   WBC 5.6 08/03/2021   HGB 10.8 (L) 08/03/2021   HCT 35.2 (L) 08/03/2021 **Note De-Identified [place] [facility]bfuscation** MCV 100.6 (H) 08/03/2021   PLT 114 (L) 08/03/2021      Chemistry      Component Value Date/Time   NA 141 08/03/2021 1512   NA 143 05/03/2021 1605   NA 142 08/26/2015 1027   K 4.6 08/03/2021 1512   K 4.3 08/26/2015 1027   CL 102 08/03/2021 1512   CO2 31 08/03/2021 1512   CO2 34 (H) 08/26/2015 1027   BUN 21 08/03/2021 1512   BUN 17 05/03/2021 1605   BUN 9.6 08/26/2015 1027   CREATININE 1.32 (H) 08/03/2021 1512   CREATININE 0.7 08/26/2015 1027      Component Value Date/Time   CALCIUM 8.8 (L) 08/03/2021 1512   CALCIUM 9.5 08/26/2015 1027   ALKPHOS 126 08/03/2021 1512   ALKPHOS 131 08/26/2015 1027   AST 29 08/03/2021 1512   AST 15 08/26/2015 1027   ALT 16 08/03/2021 1512   ALT 15 08/26/2015 1027   BILITOT 0.4 08/03/2021 1512   BILITOT 0.66 08/26/2015 1027       RADIOGRAPHIC STUDIES: CT Chest W Contrast  Result Date: 08/04/2021 CLINICAL DATA:  Primary Cancer Type: Lung Imaging Indication: Routine surveillance Interval therapy since last imaging? No Initial Cancer Diagnosis Date: 01/24/2021;  Established by: Biopsy-proven Detailed Pathology: Stage IIb non-small cell lung cancer, squamous cell carcinoma. Primary Tumor location:  Left upper lobe. Surgeries: Cholecystectomy, hysterectomy, appendectomy. Chemotherapy: Yes; Ongoing? No; Most recent administration: 03/27/2021 Immunotherapy? No Radiation therapy? Yes; Date Range: 02/20/2021 - 04/10/2021; Target: Left lung EXAM: CT CHEST WITH CONTRAST TECHNIQUE: Multidetector CT imaging of the chest was performed during intravenous contrast administration. CONTRAST:  27mL OMNIPAQUE IOHEXOL 350 MG/ML SOLN COMPARISON:  Most recent CT chest 04/27/2021.  01/20/2021 PET-CT. FINDINGS: Cardiovascular: Top-normal heart size. No significant pericardial effusion/thickening. Three-vessel coronary atherosclerosis. Atherosclerotic nonaneurysmal thoracic aorta. Stable dilated main pulmonary artery (3.6 cm diameter). No central pulmonary emboli. Mediastinum/Nodes: Apparent left hemithyroidectomy. No discrete right thyroid nodules. Unremarkable esophagus. No axillary adenopathy. Stable mildly enlarged 1.0 cm lower right paratracheal node (series 2/image 48). No new pathologically enlarged mediastinal nodes. No hilar adenopathy. Lungs/Pleura: No pneumothorax. No pleural effusion. Irregular solid 1.4 x 1.2 cm apical left upper lobe pulmonary nodule (series 7/image 21), slightly decreased from 1.6 x 1.2 cm on 04/27/2021 chest CT. New mild patchy left upper lobe and superior segment left lower lobe ground-glass opacities with new thick parenchymal band in the anterior left upper lobe. New indistinct nodular opacities measuring 0.5 cm in the peripheral right upper lobe (series 7/image 32) and 0.5 cm in the medial aspect of the superior segment left lower lobe (series 7/image 46). Upper abdomen: Liver surface appears finely irregular, cannot exclude cirrhosis. Mildly enlarged gastrohepatic ligament nodes, largest 1.1 cm (series 2/image 127), stable. Musculoskeletal: No aggressive  appearing focal osseous lesions. Moderate thoracic spondylosis. IMPRESSION: 1. Irregular solid 1.4 cm apical left upper lobe pulmonary nodule, slightly decreased in size since 04/27/2021 chest CT, compatible with response to therapy. New mild patchy left upper lobe and superior segment left lower lobe ground-glass opacities, favor early postradiation change. 2. New indistinct 0.5 cm nodular opacities in the peripheral right upper lobe and superior segment left lower lobe, indeterminate for inflammatory foci versus metastatic disease. Short-term follow-up chest CT recommended in 3 months. 3. Stable mild mediastinal and gastrohepatic ligament lymphadenopathy. 4. Stable dilated main pulmonary artery, suggesting chronic pulmonary arterial hypertension. 5. Three-vessel coronary atherosclerosis. 6. Liver surface appears finely irregular, cannot exclude cirrhosis. 7. Aortic Atherosclerosis (ICD10-I70.0). Electronically Signed   By: Rinaldo Ratel **Note De-Identified [place] [facility]bfuscation** Poff M.D.   On: 08/04/2021 10:29    ASSESSMENT AND PLAN: This is a very pleasant 65 years old white female recently diagnosed with unresectable stage IIb (T1c, N1, M0) non-small cell lung cancer, squamous cell carcinoma diagnosed in April 2022 and presented with left upper lobe lung nodule in addition to left hilar adenopathy. The patient completed a course of concurrent chemoradiation with weekly carboplatin for AUC of 2 and paclitaxel 45 Mg/M2 status post 6 cycles.   The patient is currently on observation and she is feeling fine today with no concerning complaints except for the baseline fatigue and shortness of breath at baseline and increased with exertion.  She is currently on home oxygen secondary to COPD. She had repeat CT scan of the chest performed recently.  I personally and independently reviewed the scans and discussed the results with the patient and her husband. Her scan showed no concerning findings for disease progression and there was mild patchy opacity in the  left upper lobe and superior segment of the left lower lobe probably postradiation changes. I recommended for the patient to continue on observation with repeat CT scan of the chest in 3 months. For the persistent anemia, I advised her to start taking over-the-counter oral iron tablets and multivitamins. She was advised to call immediately if she has any other concerning symptoms in the interval.  The patient voices understanding of current disease status and treatment options and is in agreement with the current care plan.  All questions were answered. The patient knows to call the clinic with any problems, questions or concerns. We can certainly see the patient much sooner if necessary.  Disclaimer: This note was dictated with voice recognition software. Similar sounding words can inadvertently be transcribed and may not be corrected upon review.

## 2021-08-11 DIAGNOSIS — J449 Chronic obstructive pulmonary disease, unspecified: Secondary | ICD-10-CM | POA: Diagnosis not present

## 2021-08-11 DIAGNOSIS — G4733 Obstructive sleep apnea (adult) (pediatric): Secondary | ICD-10-CM | POA: Diagnosis not present

## 2021-08-11 DIAGNOSIS — T85511A Breakdown (mechanical) of esophageal anti-reflux device, initial encounter: Secondary | ICD-10-CM | POA: Diagnosis not present

## 2021-08-11 DIAGNOSIS — L400 Psoriasis vulgaris: Secondary | ICD-10-CM | POA: Diagnosis not present

## 2021-08-17 ENCOUNTER — Other Ambulatory Visit: Payer: Self-pay | Admitting: Family Medicine

## 2021-08-17 DIAGNOSIS — I10 Essential (primary) hypertension: Secondary | ICD-10-CM

## 2021-08-28 DIAGNOSIS — E119 Type 2 diabetes mellitus without complications: Secondary | ICD-10-CM | POA: Diagnosis not present

## 2021-08-28 DIAGNOSIS — H34831 Tributary (branch) retinal vein occlusion, right eye, with macular edema: Secondary | ICD-10-CM | POA: Diagnosis not present

## 2021-08-28 DIAGNOSIS — H35033 Hypertensive retinopathy, bilateral: Secondary | ICD-10-CM | POA: Diagnosis not present

## 2021-08-28 DIAGNOSIS — H43813 Vitreous degeneration, bilateral: Secondary | ICD-10-CM | POA: Diagnosis not present

## 2021-08-31 ENCOUNTER — Encounter: Payer: Medicare Other | Admitting: Internal Medicine

## 2021-09-04 ENCOUNTER — Ambulatory Visit: Payer: Medicare Other | Admitting: Family Medicine

## 2021-09-07 DIAGNOSIS — G4733 Obstructive sleep apnea (adult) (pediatric): Secondary | ICD-10-CM | POA: Diagnosis not present

## 2021-09-08 ENCOUNTER — Ambulatory Visit: Payer: Medicare Other | Admitting: Family Medicine

## 2021-09-10 DIAGNOSIS — G4733 Obstructive sleep apnea (adult) (pediatric): Secondary | ICD-10-CM | POA: Diagnosis not present

## 2021-09-10 DIAGNOSIS — J449 Chronic obstructive pulmonary disease, unspecified: Secondary | ICD-10-CM | POA: Diagnosis not present

## 2021-09-10 DIAGNOSIS — T85511A Breakdown (mechanical) of esophageal anti-reflux device, initial encounter: Secondary | ICD-10-CM | POA: Diagnosis not present

## 2021-09-10 DIAGNOSIS — L400 Psoriasis vulgaris: Secondary | ICD-10-CM | POA: Diagnosis not present

## 2021-09-12 ENCOUNTER — Other Ambulatory Visit: Payer: Self-pay | Admitting: Family Medicine

## 2021-09-12 DIAGNOSIS — I10 Essential (primary) hypertension: Secondary | ICD-10-CM

## 2021-10-09 DIAGNOSIS — L409 Psoriasis, unspecified: Secondary | ICD-10-CM | POA: Diagnosis not present

## 2021-10-09 DIAGNOSIS — C3492 Malignant neoplasm of unspecified part of left bronchus or lung: Secondary | ICD-10-CM | POA: Diagnosis not present

## 2021-10-11 ENCOUNTER — Telehealth: Payer: Self-pay | Admitting: Family Medicine

## 2021-10-11 DIAGNOSIS — T85511A Breakdown (mechanical) of esophageal anti-reflux device, initial encounter: Secondary | ICD-10-CM | POA: Diagnosis not present

## 2021-10-11 DIAGNOSIS — L400 Psoriasis vulgaris: Secondary | ICD-10-CM | POA: Diagnosis not present

## 2021-10-11 DIAGNOSIS — G4733 Obstructive sleep apnea (adult) (pediatric): Secondary | ICD-10-CM | POA: Diagnosis not present

## 2021-10-11 DIAGNOSIS — J449 Chronic obstructive pulmonary disease, unspecified: Secondary | ICD-10-CM | POA: Diagnosis not present

## 2021-10-11 NOTE — Telephone Encounter (Signed)
**Note De-identified [place] Obfuscation** ° **Note De-Identified [place] [facility]bfuscation** No answer unable to leave a message for patient to call back and schedule Medicare Annual Wellness Visit (AWV) to be completed by video or phone.  No hx of AWV eligible for AWVI as of 10/01/2009 per [facility]   Please schedule at anytime with Amanda --- Karle Starch  45 Minutes appointment   Any questions, please call me at [phone]

## 2021-10-17 ENCOUNTER — Other Ambulatory Visit: Payer: Self-pay | Admitting: Family Medicine

## 2021-10-17 DIAGNOSIS — J9611 Chronic respiratory failure with hypoxia: Secondary | ICD-10-CM

## 2021-10-17 DIAGNOSIS — J410 Simple chronic bronchitis: Secondary | ICD-10-CM

## 2021-10-23 ENCOUNTER — Ambulatory Visit (INDEPENDENT_AMBULATORY_CARE_PROVIDER_SITE_OTHER): Payer: Medicare Other | Admitting: *Deleted

## 2021-10-23 DIAGNOSIS — Z0000 Encounter for general adult medical examination without abnormal findings: Secondary | ICD-10-CM

## 2021-10-23 NOTE — Progress Notes (Signed)
**Note De-Identified Speich Obfuscation** MEDICARE ANNUAL WELLNESS VISIT  10/23/2021  Telephone Visit Disclaimer This Medicare AWV was conducted by telephone due to national recommendations for restrictions regarding the COVID-19 Pandemic (e.g. social distancing).  I verified, using two identifiers, that I am speaking with Miguel Dibble Hauger or their authorized healthcare agent. I discussed the limitations, risks, security, and privacy concerns of performing an evaluation and management service by telephone and the potential availability of an in-person appointment in the future. The patient expressed understanding and agreed to proceed.  Location of Patient: home Location of Provider (nurse):  office  Subjective:    Mariely Mahr Spruce is a 66 y.o. female patient of [name], DO who had a Medicare Annual Wellness Visit today [place] telephone. Sorayah is Disabled and lives with their family. she has 3 children. she reports that she is not socially active and does interact with friends/family regularly. she is not physically active and enjoys playing games on Ipad.  Patient Care Team: [name], DO as PCP - General (Family Medicine) Biagio Borg, MD as Attending Physician (Internal Medicine) Orville Govern, MD as Referring Physician Pruitt, Royce Macadamia, [place] (Pharmacist) Valrie Hart, RN as Oncology Nurse Navigator (Oncology) Curt Bears, MD as Consulting Physician (Oncology) Irene Shipper, MD as Consulting Physician (Gastroenterology)  Advanced Directives 10/23/2021 08/07/2021 05/02/2021 04/11/2021 03/27/2021 03/13/2021 03/06/2021  Does Patient Have a Medical Advance Directive? No No No No No No No  Would patient like information on creating a medical advance directive? No - Patient declined No - Patient declined No - Patient declined No - Patient declined No - Patient declined No - Patient declined No - Patient declined  Pre-existing out of facility DNR order (yellow form or pink MOST form) - - - - - - -     Hospital Utilization Over the Past 12 Months: # of hospitalizations or ER visits: 0 # of surgeries: 0  Review of Systems    Patient reports that her overall health is better compared to last year.  History obtained from chart review and the patient  Patient Reported Readings (BP, Pulse, CBG, Weight, etc) none  Pain Assessment Pain : No/denies pain     Current Medications & Allergies (verified) Allergies as of 10/23/2021       Reactions   Doxycycline Shortness Of Breath, Other (See Comments)   Drug interaction with Soriatane   Tramadol Shortness Of Breath   Did not work   Milnacipran    REACTION: dizzy   Apremilast Rash   Rash, able to take this currently        Medication List        Accurate as of October 23, 2021  8:39 AM. If you have any questions, ask your nurse or doctor.          albuterol 108 (90 Base) MCG/ACT inhaler Commonly known as: VENTOLIN HFA Inhale 2 puffs every 4-6 hors as needed- rescue   albuterol (2.5 MG/3ML) 0.083% nebulizer solution Commonly known as: PROVENTIL Take 3 mLs (2.5 mg total) by nebulization every 4 (four) hours as needed for wheezing or shortness of breath. DX: 496   alendronate 70 MG tablet Commonly known as: FOSAMAX Take 1 tablet (70 mg total) by mouth every 7 (seven) days. Take with a full glass of water on an empty stomach. Sunday   aspirin EC 81 MG tablet Take 81 mg by mouth daily.   atorvastatin 80 MG tablet Commonly known as: LIPITOR TAKE 1 TABLET **Note De-Identified Akey Obfuscation** ONCE DAILY   Breztri Aerosphere 160-9-4.8 MCG/ACT Aero Generic drug: Budeson-Glycopyrrol-Formoterol Inhale 2 puffs into the lungs 2 (two) times daily.   calcium carbonate 1500 (600 Ca) MG Tabs tablet Commonly known as: Caltrate 600 Take 1 tablet (1,500 mg total) by mouth 2 (two) times daily with a meal.   colchicine 0.6 MG tablet TAKE 1 TABLET ONCE DAILY   Compressor/Nebulizer Misc Use up to 4 times daily when needed   FLUoxetine 20 MG capsule Commonly  known as: PROZAC TAKE 1 CAPSULE ONCE DAILY   fluticasone 50 MCG/ACT nasal spray Commonly known as: FLONASE Place 2 sprays into both nostrils daily.   Flutter Devi Patient to use device for ten minutes, three times daily   lisinopril 20 MG tablet Commonly known as: ZESTRIL TAKE 1 TABLET BY MOUTH DAILY.   montelukast 10 MG tablet Commonly known as: SINGULAIR TAKE 1 TABLET EVERY DAY   triamcinolone ointment 0.1 % Commonly known as: KENALOG Apply topically 3 (three) times daily.        History (reviewed): Past Medical History:  Diagnosis Date   Allergy    Anxiety disorder    Arthritis    bil legs   Asthma    Cancer ([place])    Chronic bronchitis    Complication of anesthesia    slow to wake up, one time she turned blue in recovery   COPD (chronic obstructive pulmonary disease) (HCC)    Depression    Diverticulitis    DJD (degenerative joint disease)    spine   Dyspnea    O2 at 3 L 24/7   Fibromyalgia    GERD (gastroesophageal reflux disease)    Glaucoma    History of thyroid cancer    HTN (hypertension)    Hyperlipidemia    Impaired glucose tolerance 05/08/2013   Lung cancer (HCC)    OSA (obstructive sleep apnea)    CPAP  last sleep study 8-10 yr.ag0   Polymyalgia ([place])    Pre-diabetes    Psoriasis    Rhinitis    Tobacco abuse    Past Surgical History:  Procedure Laterality Date   APPENDECTOMY     BRONCHIAL BIOPSY  01/24/2021   Procedure: BRONCHIAL BIOPSIES;  Surgeon: Garner Nash, DO;  Location: [facility];  Service: Pulmonary;;   BRONCHIAL BRUSHINGS  01/24/2021   Procedure: BRONCHIAL BRUSHINGS;  Surgeon: Garner Nash, DO;  Location: [facility];  Service: Pulmonary;;   BRONCHIAL NEEDLE ASPIRATION BIOPSY  01/24/2021   Procedure: BRONCHIAL NEEDLE ASPIRATION BIOPSIES;  Surgeon: Garner Nash, DO;  Location: [facility];  Service: Pulmonary;;   CARPAL TUNNEL RELEASE  10/30/2011   Procedure: CARPAL TUNNEL RELEASE;  Surgeon: Wynonia Sours, MD;   Location: [facility];  Service: Orthopedics;  Laterality: Right;  and mass excision   CHOLECYSTECTOMY     CHOLESTEATOMA EXCISION     right ear   left shoulder spurs     ORIF right lower leg     partial throidectomy     TOTAL ABDOMINAL HYSTERECTOMY     VIDEO BRONCHOSCOPY WITH ENDOBRONCHIAL NAVIGATION N/A 01/24/2021   Procedure: VIDEO BRONCHOSCOPY WITH ENDOBRONCHIAL NAVIGATION;  Surgeon: Garner Nash, DO;  Location: [facility];  Service: Pulmonary;  Laterality: N/A;   VIDEO BRONCHOSCOPY WITH ENDOBRONCHIAL ULTRASOUND N/A 01/24/2021   Procedure: VIDEO BRONCHOSCOPY WITH ENDOBRONCHIAL ULTRASOUND;  Surgeon: Garner Nash, DO;  Location: [facility];  Service: Pulmonary;  Laterality: N/A;   Family History  Problem Relation Age of Onset **Note De-Identified D[place]leberger Obfuscation** Emphysema Sister    Hypertension Sister    Heart attack Sister    Emphysema Sister    Alpha-1 antitrypsin deficiency Sister    Alpha-1 antitrypsin deficiency Sister    Allergies Sister    Asthma Sister    Heart disease Mother    Cancer Mother    Hyperlipidemia Mother    Hypertension Mother    Heart attack Mother    Heart disease Father    Heart attack Father    Cancer Sister    Cancer Brother    Hyperlipidemia Brother    Hypertension Brother    Tuberculosis Other        grandmother   Hyperlipidemia Daughter    Hypertension Daughter    Hypertension Son    Social History   Socioeconomic History   Marital status: Married    Spouse name: Not on file   Number of children: 3   Years of education: 9   Highest education level: Not on file  Occupational History   Occupation: disabled    Employer: DISABLED  Tobacco Use   Smoking status: Every Day    Packs/day: 1.00    Years: 45.00    Pack years: 45.00    Types: Cigarettes   Smokeless tobacco: Never   Tobacco comments:    Smokes 1/2 pack a day [facility] 08/04/2021  Vaping Use   Vaping Use: Never used  Substance and Sexual Activity   Alcohol use: No   Drug use: No   Sexual  activity: Not Currently  Other Topics Concern   Not on file  Social History Narrative   Married w/ children   Daily caffeine use   Social Determinants of Health   Financial Resource Strain: Low Risk    Difficulty of Paying Living Expenses: Not hard at all  Food Insecurity: No Food Insecurity   Worried About Charity fundraiser in the Last Year: Never true   Ran Out of Food in the Last Year: Never true  Transportation Needs: No Transportation Needs   Lack of Transportation (Medical): No   Lack of Transportation (Non-Medical): No  Physical Activity: Inactive   Days of Exercise per Week: 0 days   Minutes of Exercise per Session: 0 min  Stress: No Stress Concern Present   Feeling of Stress : Only a little  Social Connections: Moderately Integrated   Frequency of Communication with Friends and Family: More than three times a week   Frequency of Social Gatherings with Friends and Family: More than three times a week   Attends Religious Services: 1 to 4 times per year   Active Member of [employer] or Organizations: No   Attends Archivist Meetings: Never   Marital Status: Married    Activities of Daily Living In your present state of health, do you have any difficulty performing the following activities: 10/23/2021  Hearing? (No Data)  Comment Right ear  Vision? Y  Difficulty concentrating or making decisions? Y  Comment chemo  Walking or climbing stairs? N  Dressing or bathing? N  Doing errands, shopping? N  Comment does not drive and severe fatigue  Preparing Food and eating ? N  Using the Toilet? N  In the past six months, have you accidently leaked urine? N  Do you have problems with loss of bowel control? N  Managing your Medications? N  Managing your Finances? N  Housekeeping or managing your Housekeeping? Y  Some recent data might be hidden    Patient Education/ **Note De-Identified Espe Obfuscation** Literacy How often do you need to have someone help you when you read instructions, pamphlets, or  other written materials from your doctor or pharmacy?: 1 - Never What is the last grade level you completed in school?: 9  Exercise Current Exercise Habits: The patient does not participate in regular exercise at present, Exercise limited by: respiratory conditions(s)  Diet Patient reports consuming 1 meals a day and 0 snack(s) a day Patient reports that her primary diet is: Low Sodium Patient reports that she does have regular access to food.   Depression Screen PHQ 2/9 Scores 10/23/2021 05/03/2021 04/19/2021 01/26/2021 08/05/2020 04/29/2020 09/30/2019  PHQ - 2 Score 3 3 3  0 0 0 3  PHQ- 9 Score 13 9 12  - 0 4 7     Fall Risk Fall Risk  10/23/2021 05/03/2021 01/26/2021 08/18/2020 08/05/2020  Falls in the past year? 0 0 0 0 0  Number falls in past yr: - - - - -  Injury with Fall? - - - - -  [place] for fall due to : - - - - -  Follow up - - - Falls evaluation completed -     Objective:  [place] seemed alert and oriented and she participated appropriately during our telephone visit.  Blood Pressure Weight BMI  BP Readings from Last 3 Encounters:  08/07/21 (!) 125/54  08/04/21 140/60  07/03/21 119/62   Wt Readings from Last 3 Encounters:  08/07/21 206 lb 11.2 oz (93.8 kg)  08/04/21 205 lb 12.8 oz (93.4 kg)  07/03/21 207 lb (93.9 kg)   BMI Readings from Last 1 Encounters:  08/07/21 34.40 kg/m    *Unable to obtain current vital signs, weight, and BMI due to telephone visit type  Hearing/Vision  Nelsy did not seem to have difficulty with hearing/understanding during the telephone conversation Reports that she has not had a formal eye exam by an eye care professional within the past year Reports that she has not had a formal hearing evaluation within the past year *Unable to fully assess hearing and vision during telephone visit type  Cognitive Function: 6CIT Screen 10/23/2021  What Year? 0 points  What month? 0 points  What time? 0 points  Count back  from 20 0 points  Months in reverse 4 points  Repeat phrase 0 points  Total Score 4   (Normal:0-7, Significant for Dysfunction: >8)  Normal Cognitive Function Screening: Yes   Immunization & Health Maintenance Record Immunization History  Administered Date(s) Administered   Fluad Quad(high Dose 65+) 09/11/2019, 08/05/2020, 08/04/2021   Influenza Split 08/10/2011, 09/15/2012, 05/24/2017   Influenza Whole 07/14/2010   Influenza,inj,Quad PF,6+ Mos 06/05/2013, 05/21/2014, 06/10/2015, 09/21/2016, 07/02/2018   Janssen (J&J) SARS-COV-2 Vaccination 02/01/2020   Moderna Sars-Covid-2 Vaccination 10/11/2020   PPD Test 05/17/2014, 06/30/2015, 08/06/2016   Pneumococcal Conjugate-13 10/16/2013   Pneumococcal Polysaccharide-23 07/14/2010, 06/07/2017   Tdap 04/25/2012    Health Maintenance  Topic Date Due   Zoster Vaccines- Shingrix (1 of 2) Never done   COLONOSCOPY (Pts 45-53yrs Insurance coverage will need to be confirmed)  10/19/2020   COVID-19 Vaccine (3 - Booster for Janssen series) 12/06/2020   MAMMOGRAM  06/29/2021   HEMOGLOBIN A1C  11/03/2021   TETANUS/TDAP  04/25/2022   FOOT EXAM  05/03/2022   Pneumonia Vaccine 72+ Years old (4 - PPSV23 if available, else PCV20) 06/07/2022   OPHTHALMOLOGY EXAM  07/10/2022   DEXA SCAN  08/05/2022   INFLUENZA VACCINE **Note De-Identified Pendell Obfuscation** Completed   Hepatitis C Screening  Completed   HPV VACCINES  Aged Out       Assessment  This is a routine wellness examination for Cam C Boback.  Health Maintenance: Due or Overdue Health Maintenance Due  Topic Date Due   Zoster Vaccines- Shingrix (1 of 2) Never done   COLONOSCOPY (Pts 45-24yrs Insurance coverage will need to be confirmed)  10/19/2020   COVID-19 Vaccine (3 - Booster for Janssen series) 12/06/2020   MAMMOGRAM  06/29/2021    Liyah C Tye does not need a referral for [employer] Assistance: Care Management:   no Social Work:    no Prescription Assistance:  no Nutrition/Diabetes Education:  no   Plan:   Personalized Goals  Goals Addressed             This Visit's Progress    Quit Smoking       Try to quit smoking, she has quit for long periods of time but starts back       Personalized Health Maintenance & Screening Recommendations  Screening mammography Colorectal cancer screening Shingles vaccine  Lung Cancer Screening Recommended: no (Low Dose CT Chest recommended if Age 40-80 years, 30 pack-year currently smoking OR have quit w/in past 15 years) Hepatitis C Screening recommended: no HIV Screening recommended: no  Advanced Directives: Written information was not prepared per patient's request.  Referrals & Orders No orders of the defined types were placed in this encounter.   Follow-up Plan Follow-up with [name], DO as planned on 10/27/21 Pt needs colon cancer screening, mammogram and shingles vaccine. She is aware. Pt is still independent with all ADL's, but is very limited in what she can do because of oxygen and fatigue. Pt does have trouble hearing out of right ear only, encouraged a hearing test. Vision is bad in right eye due to a stroke. Encouaged eye exam. Advanced directive information was denied. Pt scored 12 on PHQ, but says it doesn't bother her that much. Pt had no healthcare concerns for nurse. Pt will pick up AVS at upcoming appointment.    I have personally reviewed and noted the following in the patients chart:   Medical and social history Use of alcohol, tobacco or illicit drugs  Current medications and supplements Functional ability and status Nutritional status Physical activity Advanced directives List of other physicians Hospitalizations, surgeries, and ER visits in previous 12 months Vitals Screenings to include cognitive, depression, and falls Referrals and appointments  In addition, I have reviewed and discussed with Altha Harm C Okun certain preventive protocols, quality metrics, and best practice recommendations. A  written personalized care plan for preventive services as well as general preventive health recommendations is available and can be mailed to the patient at her request.      Rana Snare, LPN  [id number]

## 2021-10-27 ENCOUNTER — Encounter: Payer: Self-pay | Admitting: Family Medicine

## 2021-10-27 ENCOUNTER — Ambulatory Visit (INDEPENDENT_AMBULATORY_CARE_PROVIDER_SITE_OTHER): Payer: Medicare Other | Admitting: Family Medicine

## 2021-10-27 VITALS — BP 130/75 | HR 88 | Temp 97.5°F | Ht 65.0 in | Wt 205.0 lb

## 2021-10-27 DIAGNOSIS — J9611 Chronic respiratory failure with hypoxia: Secondary | ICD-10-CM | POA: Diagnosis not present

## 2021-10-27 DIAGNOSIS — E1159 Type 2 diabetes mellitus with other circulatory complications: Secondary | ICD-10-CM | POA: Diagnosis not present

## 2021-10-27 DIAGNOSIS — E785 Hyperlipidemia, unspecified: Secondary | ICD-10-CM | POA: Diagnosis not present

## 2021-10-27 DIAGNOSIS — I152 Hypertension secondary to endocrine disorders: Secondary | ICD-10-CM | POA: Diagnosis not present

## 2021-10-27 DIAGNOSIS — E1169 Type 2 diabetes mellitus with other specified complication: Secondary | ICD-10-CM

## 2021-10-27 DIAGNOSIS — E119 Type 2 diabetes mellitus without complications: Secondary | ICD-10-CM | POA: Diagnosis not present

## 2021-10-27 DIAGNOSIS — Z720 Tobacco use: Secondary | ICD-10-CM | POA: Diagnosis not present

## 2021-10-27 DIAGNOSIS — F418 Other specified anxiety disorders: Secondary | ICD-10-CM

## 2021-10-27 LAB — BAYER DCA HB A1C WAIVED: HB A1C (BAYER DCA - WAIVED): 4.7 % — ABNORMAL LOW (ref 4.8–5.6)

## 2021-10-27 MED ORDER — LISINOPRIL 20 MG PO TABS: 20.0000 mg | ORAL_TABLET | Freq: Every day | ORAL | 3 refills | Status: AC

## 2021-10-27 MED ORDER — ALENDRONATE SODIUM 70 MG PO TABS: 70.0000 mg | ORAL_TABLET | ORAL | 3 refills | Status: DC

## 2021-10-27 MED ORDER — COLCHICINE 0.6 MG PO TABS: 0.6000 mg | ORAL_TABLET | Freq: Every day | ORAL | 3 refills | Status: AC

## 2021-10-27 MED ORDER — FLUOXETINE HCL 40 MG PO CAPS: 40.0000 mg | ORAL_CAPSULE | Freq: Every day | ORAL | 3 refills | Status: DC

## 2021-10-27 MED ORDER — ATORVASTATIN CALCIUM 80 MG PO TABS: 80.0000 mg | ORAL_TABLET | Freq: Every day | ORAL | 3 refills | Status: AC

## 2021-10-27 NOTE — Progress Notes (Signed)
**Note De-Identified Eustice Obfuscation** Subjective: CC: Anxiety and depression, diabetes PCP: [name], DO [id number] Hailey Hernandez is a 66 y.o. female who is accompanied today's visit by her spouse.  She is presenting to clinic today for:  1.  Anxiety and depression Patient reports that her depression has been "worse.  She reports that a couple of family members have now been diagnosed with cancer as well and this is really weight on her spirit.  She is compliant with Prozac 20 mg daily.  She would be glad to increase the dose if this would be helpful.  She unfortunately has been crying a lot and feels like her nerves are on edge  2.  Type 2 diabetes associated with hypertension and hyperlipidemia Patient has had diet-controlled type 2 diabetes.  She is compliant with Lipitor, lisinopril.  No reports of chest pain.  She is chronically hypoxic and therefore treated with 3 L of oxygen Sforza nasal cannula.  She reports a recent blessing where she was gifted to oxygen concentrators and batteries.  She does continue to smoke unfortunately but is motivated to quit.  She "does not even like cigarettes".  She has been having some reactivity from the adhesive on the nicotine patches and is asking for advice on this.  She would be interested in going back on Chantix.   ROS: Per HPI  Allergies  Allergen Reactions   Doxycycline Shortness Of Breath and Other (See Comments)    Drug interaction with Soriatane   Tramadol Shortness Of Breath    Did not work   Milnacipran     REACTION: dizzy   Apremilast Rash    Rash, able to take this currently   Past Medical History:  Diagnosis Date   Allergy    Anxiety disorder    Arthritis    bil legs   Asthma    Cancer ([place])    Chronic bronchitis    Complication of anesthesia    slow to wake up, one time she turned blue in recovery   COPD (chronic obstructive pulmonary disease) (HCC)    Depression    Diverticulitis    DJD (degenerative joint disease)    spine   Dyspnea    O2 at 3 L  24/7   Fibromyalgia    GERD (gastroesophageal reflux disease)    Glaucoma    History of thyroid cancer    HTN (hypertension)    Hyperlipidemia    Impaired glucose tolerance 05/08/2013   Lung cancer (HCC)    OSA (obstructive sleep apnea)    CPAP  last sleep study 8-10 yr.ag0   Polymyalgia (HCC)    Pre-diabetes    Psoriasis    Rhinitis    Tobacco abuse     Current Outpatient Medications:    albuterol (PROVENTIL) (2.5 MG/3ML) 0.083% nebulizer solution, Take 3 mLs (2.5 mg total) by nebulization every 4 (four) hours as needed for wheezing or shortness of breath. DX: 496, Disp: 180 mL, Rfl: 12   albuterol (VENTOLIN HFA) 108 (90 Base) MCG/ACT inhaler, Inhale 2 puffs every 4-6 hors as needed- rescue, Disp: 18 g, Rfl: 12   alendronate (FOSAMAX) 70 MG tablet, Take 1 tablet (70 mg total) by mouth every 7 (seven) days. Take with a full glass of water on an empty stomach. Sunday, Disp: 4 tablet, Rfl: 0   aspirin EC 81 MG tablet, Take 81 mg by mouth daily., Disp: , Rfl:    atorvastatin (LIPITOR) 80 MG tablet, TAKE 1 TABLET ONCE DAILY, Disp: 30 tablet, **Note De-Identified Norling Obfuscation** Rfl: 1   Budeson-Glycopyrrol-Formoterol (BREZTRI AEROSPHERE) 160-9-4.8 MCG/ACT AERO, Inhale 2 puffs into the lungs 2 (two) times daily., Disp: 10.7 g, Rfl: 5   calcium carbonate (CALTRATE 600) 1500 (600 Ca) MG TABS tablet, Take 1 tablet (1,500 mg total) by mouth 2 (two) times daily with a meal., Disp: 60 tablet, Rfl: 3   colchicine 0.6 MG tablet, TAKE 1 TABLET ONCE DAILY, Disp: 30 tablet, Rfl: 1   FLUoxetine (PROZAC) 20 MG capsule, TAKE 1 CAPSULE ONCE DAILY, Disp: 90 capsule, Rfl: 0   fluticasone (FLONASE) 50 MCG/ACT nasal spray, Place 2 sprays into both nostrils daily., Disp: 16 g, Rfl: 6   lisinopril (ZESTRIL) 20 MG tablet, TAKE 1 TABLET BY MOUTH DAILY., Disp: 30 tablet, Rfl: 1   montelukast (SINGULAIR) 10 MG tablet, TAKE 1 TABLET EVERY DAY, Disp: 90 tablet, Rfl: 1   Nebulizers (COMPRESSOR/NEBULIZER) MISC, Use up to 4 times daily when needed, Disp: 1  each, Rfl: 0   Respiratory Therapy Supplies (FLUTTER) DEVI, Patient to use device for ten minutes, three times daily, Disp: 1 each, Rfl: 0   triamcinolone ointment (KENALOG) 0.1 %, Apply topically 3 (three) times daily., Disp: , Rfl:  Social History   Socioeconomic History   Marital status: Married    Spouse name: Not on file   Number of children: 3   Years of education: 9   Highest education level: Not on file  Occupational History   Occupation: disabled    Employer: DISABLED  Tobacco Use   Smoking status: Every Day    Packs/day: 1.00    Years: 45.00    Pack years: 45.00    Types: Cigarettes   Smokeless tobacco: Never   Tobacco comments:    Smokes 1/2 pack a day [facility] 08/04/2021  Vaping Use   Vaping Use: Never used  Substance and Sexual Activity   Alcohol use: No   Drug use: No   Sexual activity: Not Currently  Other Topics Concern   Not on file  Social History Narrative   Married w/ children   Daily caffeine use   Social Determinants of Health   Financial Resource Strain: Low Risk    Difficulty of Paying Living Expenses: Not hard at all  Food Insecurity: No Food Insecurity   Worried About Charity fundraiser in the Last Year: Never true   [place] in the Last Year: Never true  Transportation Needs: No Transportation Needs   Lack of Transportation (Medical): No   Lack of Transportation (Non-Medical): No  Physical Activity: Inactive   Days of Exercise per Week: 0 days   Minutes of Exercise per Session: 0 min  Stress: No Stress Concern Present   Feeling of Stress : Only a little  Social Connections: Moderately Integrated   Frequency of Communication with Friends and Family: More than three times a week   Frequency of Social Gatherings with Friends and Family: More than three times a week   Attends Religious Services: 1 to 4 times per year   Active Member of [employer] or Organizations: No   Attends Archivist Meetings: Never   Marital Status: Married   Human resources officer Violence: Not At Risk   Fear of Current or Ex-Partner: No   Emotionally Abused: No   Physically Abused: No   Sexually Abused: No   Family History  Problem Relation Age of Onset   Emphysema Sister    Hypertension Sister    Heart attack Sister    Emphysema Sister **Note De-Identified Francois Obfuscation** Alpha-1 antitrypsin deficiency Sister    Alpha-1 antitrypsin deficiency Sister    Allergies Sister    Asthma Sister    Heart disease Mother    Cancer Mother    Hyperlipidemia Mother    Hypertension Mother    Heart attack Mother    Heart disease Father    Heart attack Father    Cancer Sister    Cancer Brother    Hyperlipidemia Brother    Hypertension Brother    Tuberculosis Other        grandmother   Hyperlipidemia Daughter    Hypertension Daughter    Hypertension Son     Objective: Office vital signs reviewed. BP 130/75    Pulse 88    Temp (!) 97.5 F (36.4 Hailey)    Ht _0  (1.651 m)    Wt 205 lb (93 kg)    SpO2 94%    BMI 34.11 kg/m   Physical Examination:  General: Awake, alert, chronically ill-appearing female, No acute distress HEENT: Sclera white Cardio: regular rate and rhythm, S1S2 heard, no murmurs appreciated Pulm: Globally decreased breath sounds.  Normal work of breathing on 3 L of oxygen by nasal cannula Extremities: No edema Skin: Healing psoriatic lesions noted along the bilateral upper extremities Psych: Mood depressed.  Patient very pleasant and interactive  Depression screen [facility] 2/9 10/27/2021 10/23/2021 05/03/2021  Decreased Interest 0 2 3  Down, Depressed, Hopeless 0 1 0  PHQ - 2 Score 0 3 3  Altered sleeping 0 0 3  Tired, decreased energy _1 Change in appetite 0 3 0  Feeling bad or failure about yourself  0 1 0  Trouble concentrating 1 2 0  Moving slowly or fidgety/restless 0 1 0  Suicidal thoughts 0 0 0  PHQ-9 Score _2 Difficult doing work/chores Not difficult at all Somewhat difficult Somewhat difficult  Some recent data might be hidden   GAD 7 :  Generalized Anxiety Score 10/27/2021 05/03/2021 01/26/2021 04/29/2020  Nervous, Anxious, on Edge 1 1 0 0  Control/stop worrying 3 1 0 2  Worry too much - different things 3 2 0 2  Trouble relaxing 0 1 0 0  Restless 0 0 0 0  Easily annoyed or irritable 0 3 0 0  Afraid - awful might happen 0 0 0 0  Total GAD 7 Score 7 8 0 4  Anxiety Difficulty Somewhat difficult Somewhat difficult Not difficult at all Not difficult at all   Assessment/ Plan: 66 y.o. female   Chronic respiratory failure with hypoxia (HCC)  Tobacco use  Depression with anxiety - Plan: FLUoxetine (PROZAC) 40 MG capsule  Diet-controlled diabetes mellitus ([place]) - Plan: Bayer DCA Hb A1c Waived, CMP14+EGFR, CBC with Differential  Hypertension associated with diabetes ([place]) - Plan: lisinopril (ZESTRIL) 20 MG tablet  Hyperlipidemia associated with type 2 diabetes mellitus ([place])  Continue O2 as prescribed.  Agree with weaning from tobacco.  Unfortunately her depression and anxiety are not well controlled so hesitate to add back in Chantix just yet.  I have recommended that she use her nasal corticosteroid on the area of which she plans apply the patch to reduce inflammatory response.  We discussed letting that dry down before she applies her nicotine patch.  We will advance her fluoxetine to 40 mg daily.  Discussed alternatives including addition of BuSpar for treatment.  We will try and maximize therapy before adding more on.  Would like to reassess her in the next **Note De-Identified Leiner Obfuscation** 4 to 6 weeks.  If her depression and anxiety are under better control at that time, we could consider addition of Chantix if she is not meeting her smoking cessation goals  Diabetes remains diet controlled.  No changes  Blood pressure remains under good control.  No changes  Not yet due for fasting lipid.  Continue statin  Orders Placed This Encounter  Procedures   Bayer [place] Hb A1c Waived   CMP14+EGFR   CBC with Differential    Order Specific Question:   CC  Results    Answer:   MOHAMED, MOHAMED [[id number]]   Meds ordered this encounter  Medications   lisinopril (ZESTRIL) 20 MG tablet    Sig: Take 1 tablet (20 mg total) by mouth daily.    Dispense:  90 tablet    Refill:  3   FLUoxetine (PROZAC) 40 MG capsule    Sig: Take 1 capsule (40 mg total) by mouth daily. For anxiety and depression    Dispense:  90 capsule    Refill:  3   colchicine 0.6 MG tablet    Sig: Take 1 tablet (0.6 mg total) by mouth daily.    Dispense:  90 tablet    Refill:  3   atorvastatin (LIPITOR) 80 MG tablet    Sig: Take 1 tablet (80 mg total) by mouth daily.    Dispense:  90 tablet    Refill:  3   DISCONTD: alendronate (FOSAMAX) 70 MG tablet    Sig: Take 1 tablet (70 mg total) by mouth every 7 (seven) days. Take with a full glass of water on an empty stomach. Sunday    Dispense:  12 tablet    Refill:  3   alendronate (FOSAMAX) 70 MG tablet    Sig: Take 1 tablet (70 mg total) by mouth every 7 (seven) days. Take with a full glass of water on an empty stomach. Sunday    Dispense:  12 tablet    Refill:  [place], [place] [phone]

## 2021-10-27 NOTE — Patient Instructions (Signed)
**Note De-Identified Arney Obfuscation** Use your nasal spray on the area that you are going to put your patch on for smoking before you put the patch on.  This should help with the rash that develops.  Just spray 1 to 2 sprays on that area before putting the patch.  I have increased your Prozac to 40 mg daily.  I would like to see you back in 1 month for recheck, sooner if concerns arise

## 2021-10-28 LAB — CBC WITH DIFFERENTIAL/PLATELET
Basophils Absolute: 0 10*3/uL (ref 0.0–0.2)
Basos: 0 %
EOS (ABSOLUTE): 0.2 10*3/uL (ref 0.0–0.4)
Eos: 4 %
Hematocrit: 38.4 % (ref 34.0–46.6)
Hemoglobin: 12.8 g/dL (ref 11.1–15.9)
Immature Grans (Abs): 0 10*3/uL (ref 0.0–0.1)
Immature Granulocytes: 0 %
Lymphocytes Absolute: 1.2 10*3/uL (ref 0.7–3.1)
Lymphs: 21 %
MCH: 32 pg (ref 26.6–33.0)
MCHC: 33.3 g/dL (ref 31.5–35.7)
MCV: 96 fL (ref 79–97)
Monocytes Absolute: 0.6 10*3/uL (ref 0.1–0.9)
Monocytes: 9 %
Neutrophils Absolute: 3.8 10*3/uL (ref 1.4–7.0)
Neutrophils: 66 %
Platelets: 92 10*3/uL — CL (ref 150–450)
RBC: 4 x10E6/uL (ref 3.77–5.28)
RDW: 12.2 % (ref 11.7–15.4)
WBC: 5.9 10*3/uL (ref 3.4–10.8)

## 2021-10-28 LAB — CMP14+EGFR
ALT: 23 IU/L (ref 0–32)
AST: 33 IU/L (ref 0–40)
Albumin/Globulin Ratio: 0.7 — ABNORMAL LOW (ref 1.2–2.2)
Albumin: 3.2 g/dL — ABNORMAL LOW (ref 3.8–4.8)
Alkaline Phosphatase: 143 IU/L — ABNORMAL HIGH (ref 44–121)
BUN/Creatinine Ratio: 16 (ref 12–28)
BUN: 16 mg/dL (ref 8–27)
Bilirubin Total: 0.5 mg/dL (ref 0.0–1.2)
CO2: 31 mmol/L — ABNORMAL HIGH (ref 20–29)
Calcium: 8.7 mg/dL (ref 8.7–10.3)
Chloride: 101 mmol/L (ref 96–106)
Creatinine, Ser: 1 mg/dL (ref 0.57–1.00)
Globulin, Total: 4.4 g/dL (ref 1.5–4.5)
Glucose: 82 mg/dL (ref 70–99)
Potassium: 5 mmol/L (ref 3.5–5.2)
Sodium: 141 mmol/L (ref 134–144)
Total Protein: 7.6 g/dL (ref 6.0–8.5)
eGFR: 62 mL/min/{1.73_m2} (ref >59)

## 2021-11-06 ENCOUNTER — Encounter (HOSPITAL_COMMUNITY): Payer: Self-pay

## 2021-11-06 ENCOUNTER — Ambulatory Visit (HOSPITAL_COMMUNITY)
Admission: RE | Admit: 2021-11-06 | Discharge: 2021-11-06 | Disposition: A | Discharge status: Home / Self Care | Payer: Medicare Other | Source: Ambulatory Visit | Attending: Internal Medicine | Admitting: Internal Medicine

## 2021-11-06 ENCOUNTER — Other Ambulatory Visit: Payer: Self-pay

## 2021-11-06 ENCOUNTER — Inpatient Hospital Stay: Payer: Medicare Other | Attending: Internal Medicine

## 2021-11-06 DIAGNOSIS — Z881 Allergy status to other antibiotic agents status: Secondary | ICD-10-CM | POA: Insufficient documentation

## 2021-11-06 DIAGNOSIS — C3490 Malignant neoplasm of unspecified part of unspecified bronchus or lung: Secondary | ICD-10-CM

## 2021-11-06 DIAGNOSIS — I700 Atherosclerosis of aorta: Secondary | ICD-10-CM | POA: Diagnosis not present

## 2021-11-06 DIAGNOSIS — I10 Essential (primary) hypertension: Secondary | ICD-10-CM | POA: Insufficient documentation

## 2021-11-06 DIAGNOSIS — L409 Psoriasis, unspecified: Secondary | ICD-10-CM | POA: Insufficient documentation

## 2021-11-06 DIAGNOSIS — R0602 Shortness of breath: Secondary | ICD-10-CM | POA: Insufficient documentation

## 2021-11-06 DIAGNOSIS — Z85850 Personal history of malignant neoplasm of thyroid: Secondary | ICD-10-CM | POA: Insufficient documentation

## 2021-11-06 DIAGNOSIS — J449 Chronic obstructive pulmonary disease, unspecified: Secondary | ICD-10-CM | POA: Insufficient documentation

## 2021-11-06 DIAGNOSIS — E119 Type 2 diabetes mellitus without complications: Secondary | ICD-10-CM | POA: Insufficient documentation

## 2021-11-06 DIAGNOSIS — Z79899 Other long term (current) drug therapy: Secondary | ICD-10-CM | POA: Insufficient documentation

## 2021-11-06 DIAGNOSIS — Z888 Allergy status to other drugs, medicaments and biological substances status: Secondary | ICD-10-CM | POA: Insufficient documentation

## 2021-11-06 DIAGNOSIS — R590 Localized enlarged lymph nodes: Secondary | ICD-10-CM | POA: Insufficient documentation

## 2021-11-06 DIAGNOSIS — Z885 Allergy status to narcotic agent status: Secondary | ICD-10-CM | POA: Insufficient documentation

## 2021-11-06 DIAGNOSIS — C3412 Malignant neoplasm of upper lobe, left bronchus or lung: Secondary | ICD-10-CM | POA: Insufficient documentation

## 2021-11-06 DIAGNOSIS — J439 Emphysema, unspecified: Secondary | ICD-10-CM | POA: Diagnosis not present

## 2021-11-06 DIAGNOSIS — I2510 Atherosclerotic heart disease of native coronary artery without angina pectoris: Secondary | ICD-10-CM | POA: Insufficient documentation

## 2021-11-06 DIAGNOSIS — Z9221 Personal history of antineoplastic chemotherapy: Secondary | ICD-10-CM | POA: Insufficient documentation

## 2021-11-06 DIAGNOSIS — Z923 Personal history of irradiation: Secondary | ICD-10-CM | POA: Insufficient documentation

## 2021-11-06 DIAGNOSIS — R911 Solitary pulmonary nodule: Secondary | ICD-10-CM | POA: Diagnosis not present

## 2021-11-06 DIAGNOSIS — K7460 Unspecified cirrhosis of liver: Secondary | ICD-10-CM | POA: Insufficient documentation

## 2021-11-06 DIAGNOSIS — D649 Anemia, unspecified: Secondary | ICD-10-CM | POA: Insufficient documentation

## 2021-11-06 DIAGNOSIS — Z9049 Acquired absence of other specified parts of digestive tract: Secondary | ICD-10-CM | POA: Insufficient documentation

## 2021-11-06 LAB — CMP (CANCER CENTER ONLY)
ALT: 21 U/L (ref 0–44)
AST: 29 U/L (ref 15–41)
Albumin: 3.7 g/dL (ref 3.5–5.0)
Alkaline Phosphatase: 121 U/L (ref 38–126)
Anion gap: 5 (ref 5–15)
BUN: 18 mg/dL (ref 8–23)
CO2: 33 mmol/L — ABNORMAL HIGH (ref 22–32)
Calcium: 8.9 mg/dL (ref 8.9–10.3)
Chloride: 101 mmol/L (ref 98–111)
Creatinine: 1 mg/dL (ref 0.44–1.00)
GFR, Estimated: >60 mL/min (ref >60)
Glucose, Bld: 98 mg/dL (ref 70–99)
Potassium: 4.5 mmol/L (ref 3.5–5.1)
Sodium: 139 mmol/L (ref 135–145)
Total Bilirubin: 0.7 mg/dL (ref 0.3–1.2)
Total Protein: 7.7 g/dL (ref 6.5–8.1)

## 2021-11-06 LAB — CBC WITH DIFFERENTIAL (CANCER CENTER ONLY)
Abs Immature Granulocytes: 0.01 10*3/uL (ref 0.00–0.07)
Basophils Absolute: 0 10*3/uL (ref 0.0–0.1)
Basophils Relative: 1 %
Eosinophils Absolute: 0.3 10*3/uL (ref 0.0–0.5)
Eosinophils Relative: 5 %
HCT: 38.4 % (ref 36.0–46.0)
Hemoglobin: 12.1 g/dL (ref 12.0–15.0)
Immature Granulocytes: 0 %
Lymphocytes Relative: 20 %
Lymphs Abs: 1.1 10*3/uL (ref 0.7–4.0)
MCH: 31.7 pg (ref 26.0–34.0)
MCHC: 31.5 g/dL (ref 30.0–36.0)
MCV: 100.5 fL — ABNORMAL HIGH (ref 80.0–100.0)
Monocytes Absolute: 0.5 10*3/uL (ref 0.1–1.0)
Monocytes Relative: 9 %
Neutro Abs: 3.6 10*3/uL (ref 1.7–7.7)
Neutrophils Relative %: 65 %
Platelet Count: 91 10*3/uL — ABNORMAL LOW (ref 150–400)
RBC: 3.82 MIL/uL — ABNORMAL LOW (ref 3.87–5.11)
RDW: 14 % (ref 11.5–15.5)
WBC Count: 5.4 10*3/uL (ref 4.0–10.5)
nRBC: 0 % (ref 0.0–0.2)

## 2021-11-06 MED ORDER — SODIUM CHLORIDE (PF) 0.9 % IJ SOLN
INTRAMUSCULAR | Status: AC
  Filled 2021-11-06: qty 50

## 2021-11-06 MED ORDER — IOHEXOL 300 MG/ML  SOLN
75.0000 mL | Freq: Once | INTRAMUSCULAR | Status: AC | PRN
  Administered 2021-11-06: 75 mL via INTRAVENOUS

## 2021-11-08 ENCOUNTER — Inpatient Hospital Stay: Payer: Medicare Other | Admitting: Internal Medicine

## 2021-11-08 ENCOUNTER — Other Ambulatory Visit: Payer: Self-pay

## 2021-11-08 VITALS — BP 130/63 | HR 89 | Temp 97.2°F | Resp 19 | Ht 65.0 in | Wt 203.7 lb

## 2021-11-08 DIAGNOSIS — I700 Atherosclerosis of aorta: Secondary | ICD-10-CM | POA: Diagnosis not present

## 2021-11-08 DIAGNOSIS — C3490 Malignant neoplasm of unspecified part of unspecified bronchus or lung: Secondary | ICD-10-CM | POA: Diagnosis not present

## 2021-11-08 DIAGNOSIS — J449 Chronic obstructive pulmonary disease, unspecified: Secondary | ICD-10-CM | POA: Diagnosis not present

## 2021-11-08 DIAGNOSIS — K7460 Unspecified cirrhosis of liver: Secondary | ICD-10-CM | POA: Diagnosis not present

## 2021-11-08 DIAGNOSIS — D649 Anemia, unspecified: Secondary | ICD-10-CM | POA: Diagnosis not present

## 2021-11-08 DIAGNOSIS — R590 Localized enlarged lymph nodes: Secondary | ICD-10-CM | POA: Diagnosis not present

## 2021-11-08 DIAGNOSIS — Z888 Allergy status to other drugs, medicaments and biological substances status: Secondary | ICD-10-CM | POA: Diagnosis not present

## 2021-11-08 DIAGNOSIS — I10 Essential (primary) hypertension: Secondary | ICD-10-CM | POA: Diagnosis not present

## 2021-11-08 DIAGNOSIS — Z9221 Personal history of antineoplastic chemotherapy: Secondary | ICD-10-CM | POA: Diagnosis not present

## 2021-11-08 DIAGNOSIS — C3412 Malignant neoplasm of upper lobe, left bronchus or lung: Secondary | ICD-10-CM | POA: Diagnosis not present

## 2021-11-08 DIAGNOSIS — Z9049 Acquired absence of other specified parts of digestive tract: Secondary | ICD-10-CM | POA: Diagnosis not present

## 2021-11-08 DIAGNOSIS — I2510 Atherosclerotic heart disease of native coronary artery without angina pectoris: Secondary | ICD-10-CM | POA: Diagnosis not present

## 2021-11-08 DIAGNOSIS — R0602 Shortness of breath: Secondary | ICD-10-CM | POA: Diagnosis not present

## 2021-11-08 DIAGNOSIS — Z85850 Personal history of malignant neoplasm of thyroid: Secondary | ICD-10-CM | POA: Diagnosis not present

## 2021-11-08 DIAGNOSIS — Z885 Allergy status to narcotic agent status: Secondary | ICD-10-CM | POA: Diagnosis not present

## 2021-11-08 DIAGNOSIS — Z881 Allergy status to other antibiotic agents status: Secondary | ICD-10-CM | POA: Diagnosis not present

## 2021-11-08 DIAGNOSIS — E119 Type 2 diabetes mellitus without complications: Secondary | ICD-10-CM | POA: Diagnosis not present

## 2021-11-08 DIAGNOSIS — Z923 Personal history of irradiation: Secondary | ICD-10-CM | POA: Diagnosis not present

## 2021-11-08 DIAGNOSIS — Z79899 Other long term (current) drug therapy: Secondary | ICD-10-CM | POA: Diagnosis not present

## 2021-11-08 DIAGNOSIS — L409 Psoriasis, unspecified: Secondary | ICD-10-CM | POA: Diagnosis not present

## 2021-11-08 NOTE — Progress Notes (Signed)
**Note De-Identified Geurin Obfuscation** [place] [phone]   [phone]  OFFICE PROGRESS NOTE  [name], DO [place] [address]  DIAGNOSIS: Stage IIb (T1c, N1, M0) non-small cell lung cancer, squamous cell carcinoma presented with left upper lobe lung nodule in addition to left hilar adenopathy diagnosed in April 2022.   PRIOR THERAPY: Concurrent chemoradiation with weekly carboplatin for AUC of 2 and paclitaxel 45 Mg/M2 for 6-7 weeks. First dose on 02/21/21. Status post 6 cycles.  Last dose was given on March 27, 2021.  The patient is not a candidate for consolidation immunotherapy because of the extensive psoriasis.   CURRENT THERAPY: Observation.  INTERVAL HISTORY: Hailey Hernandez 66 y.o. female returns to the clinic today for follow-up visit accompanied by her husband.  The patient is feeling fine today with no concerning complaints except for the baseline shortness of breath.  She is currently on treatment for psoriasis with Tremfya by her dermatologist.  She denied having any current chest pain but has mild cough with no hemoptysis.  She denied having any fever or chills.  She denied having any nausea, vomiting, diarrhea or constipation.  The patient is currently on observation.  She had repeat CT scan of the chest performed recently and she is here for evaluation and discussion of her scan results.    MEDICAL HISTORY: Past Medical History:  Diagnosis Date   Allergy    Anxiety disorder    Arthritis    bil legs   Asthma    Cancer ([place])    Chronic bronchitis    Complication of anesthesia    slow to wake up, one time she turned blue in recovery   COPD (chronic obstructive pulmonary disease) (HCC)    Depression    Diverticulitis    DJD (degenerative joint disease)    spine   Dyspnea    O2 at 3 L 24/7   Fibromyalgia    GERD (gastroesophageal reflux disease)    Glaucoma    History of thyroid cancer    HTN (hypertension)    Hyperlipidemia     Impaired glucose tolerance 05/08/2013   Lung cancer (HCC)    OSA (obstructive sleep apnea)    CPAP  last sleep study 8-10 yr.ag0   Polymyalgia ([place])    Pre-diabetes    Psoriasis    Rhinitis    Tobacco abuse     ALLERGIES:  is allergic to doxycycline, tramadol, milnacipran, and apremilast.  MEDICATIONS:  Current Outpatient Medications  Medication Sig Dispense Refill   albuterol (PROVENTIL) (2.5 MG/3ML) 0.083% nebulizer solution Take 3 mLs (2.5 mg total) by nebulization every 4 (four) hours as needed for wheezing or shortness of breath. DX: 496 180 mL 12   albuterol (VENTOLIN HFA) 108 (90 Base) MCG/ACT inhaler Inhale 2 puffs every 4-6 hors as needed- rescue 18 g 12   alendronate (FOSAMAX) 70 MG tablet Take 1 tablet (70 mg total) by mouth every 7 (seven) days. Take with a full glass of water on an empty stomach. Sunday 12 tablet 3   aspirin EC 81 MG tablet Take 81 mg by mouth daily.     atorvastatin (LIPITOR) 80 MG tablet Take 1 tablet (80 mg total) by mouth daily. 90 tablet 3   Budeson-Glycopyrrol-Formoterol (BREZTRI AEROSPHERE) 160-9-4.8 MCG/ACT AERO Inhale 2 puffs into the lungs 2 (two) times daily. 10.7 g 5   calcium carbonate (CALTRATE 600) 1500 (600 Ca) MG TABS tablet Take 1 tablet (1,500 mg **Note De-Identified Pfiffner Obfuscation** total) by mouth 2 (two) times daily with a meal. 60 tablet 3   colchicine 0.6 MG tablet Take 1 tablet (0.6 mg total) by mouth daily. 90 tablet 3   FLUoxetine (PROZAC) 40 MG capsule Take 1 capsule (40 mg total) by mouth daily. For anxiety and depression 90 capsule 3   fluticasone (FLONASE) 50 MCG/ACT nasal spray Place 2 sprays into both nostrils daily. 16 g 6   lisinopril (ZESTRIL) 20 MG tablet Take 1 tablet (20 mg total) by mouth daily. 90 tablet 3   montelukast (SINGULAIR) 10 MG tablet TAKE 1 TABLET EVERY DAY 90 tablet 1   Nebulizers (COMPRESSOR/NEBULIZER) MISC Use up to 4 times daily when needed 1 each 0   Respiratory Therapy Supplies (FLUTTER) DEVI Patient to use device for ten minutes, three  times daily 1 each 0   triamcinolone ointment (KENALOG) 0.1 % Apply topically 3 (three) times daily.     No current facility-administered medications for this visit.    SURGICAL HISTORY:  Past Surgical History:  Procedure Laterality Date   APPENDECTOMY     BRONCHIAL BIOPSY  01/24/2021   Procedure: BRONCHIAL BIOPSIES;  Surgeon: Garner Nash, DO;  Location: [place];  Service: Pulmonary;;   BRONCHIAL BRUSHINGS  01/24/2021   Procedure: BRONCHIAL BRUSHINGS;  Surgeon: Garner Nash, DO;  Location: [place] [facility];  Service: Pulmonary;;   BRONCHIAL NEEDLE ASPIRATION BIOPSY  01/24/2021   Procedure: BRONCHIAL NEEDLE ASPIRATION BIOPSIES;  Surgeon: Garner Nash, DO;  Location: [facility] [facility];  Service: Pulmonary;;   CARPAL TUNNEL RELEASE  10/30/2011   Procedure: CARPAL TUNNEL RELEASE;  Surgeon: Wynonia Sours, MD;  Location: [facility];  Service: Orthopedics;  Laterality: Right;  and mass excision   CHOLECYSTECTOMY     CHOLESTEATOMA EXCISION     right ear   left shoulder spurs     ORIF right lower leg     partial throidectomy     TOTAL ABDOMINAL HYSTERECTOMY     VIDEO BRONCHOSCOPY WITH ENDOBRONCHIAL NAVIGATION N/A 01/24/2021   Procedure: VIDEO BRONCHOSCOPY WITH ENDOBRONCHIAL NAVIGATION;  Surgeon: Garner Nash, DO;  Location: [facility];  Service: Pulmonary;  Laterality: N/A;   VIDEO BRONCHOSCOPY WITH ENDOBRONCHIAL ULTRASOUND N/A 01/24/2021   Procedure: VIDEO BRONCHOSCOPY WITH ENDOBRONCHIAL ULTRASOUND;  Surgeon: Garner Nash, DO;  Location: [facility];  Service: Pulmonary;  Laterality: N/A;    REVIEW OF SYSTEMS:  A comprehensive review of systems was negative except for: Constitutional: positive for fatigue Respiratory: positive for dyspnea on exertion Integument/breast: positive for rash   PHYSICAL EXAMINATION: General appearance: alert, cooperative, fatigued, and no distress Head: Normocephalic, without obvious abnormality, atraumatic Neck: no adenopathy, no  JVD, supple, symmetrical, trachea midline, and thyroid not enlarged, symmetric, no tenderness/mass/nodules Lymph nodes: Cervical, supraclavicular, and axillary nodes normal. Resp: clear to auscultation bilaterally Back: negative, symmetric, no curvature. ROM normal. No CVA tenderness. Cardio: regular rate and rhythm, S1, S2 normal, no murmur, click, rub or gallop GI: soft, non-tender; bowel sounds normal; no masses,  no organomegaly Extremities: extremities normal, atraumatic, no cyanosis or edemaand mild tenderness to palpation on the left calf.  ECOG PERFORMANCE STATUS: 1 - Symptomatic but completely ambulatory  Blood pressure 130/63, pulse 89, temperature (!) 97.2 F (36.2 C), temperature source Tympanic, resp. rate 19, height 5\' 5"  (1.651 m), weight 203 lb 11.2 oz (92.4 kg), SpO2 96 %.  LABORATORY DATA: Lab Results  Component Value Date   WBC 5.4 11/06/2021   HGB 12.1 11/06/2021   HCT 38.4 **Note De-Identified Kreeger Obfuscation** 11/06/2021   MCV 100.5 (H) 11/06/2021   PLT 91 (L) 11/06/2021      Chemistry      Component Value Date/Time   NA 139 11/06/2021 1341   NA 141 10/27/2021 1512   NA 142 08/26/2015 1027   K 4.5 11/06/2021 1341   K 4.3 08/26/2015 1027   CL 101 11/06/2021 1341   CO2 33 (H) 11/06/2021 1341   CO2 34 (H) 08/26/2015 1027   BUN 18 11/06/2021 1341   BUN 16 10/27/2021 1512   BUN 9.6 08/26/2015 1027   CREATININE 1.00 11/06/2021 1341   CREATININE 0.7 08/26/2015 1027      Component Value Date/Time   CALCIUM 8.9 11/06/2021 1341   CALCIUM 9.5 08/26/2015 1027   ALKPHOS 121 11/06/2021 1341   ALKPHOS 131 08/26/2015 1027   AST 29 11/06/2021 1341   AST 15 08/26/2015 1027   ALT 21 11/06/2021 1341   ALT 15 08/26/2015 1027   BILITOT 0.7 11/06/2021 1341   BILITOT 0.66 08/26/2015 1027       RADIOGRAPHIC STUDIES: CT Chest W Contrast  Result Date: 11/07/2021 CLINICAL DATA:  Non-small cell lung cancer restaging, status post radiation and chemotherapy EXAM: CT CHEST WITH CONTRAST TECHNIQUE:  Multidetector CT imaging of the chest was performed during intravenous contrast administration. RADIATION DOSE REDUCTION: This exam was performed according to the departmental dose-optimization program which includes automated exposure control, adjustment of the mA and/or kV according to patient size and/or use of iterative reconstruction technique. CONTRAST:  18mL OMNIPAQUE IOHEXOL 300 MG/ML  SOLN COMPARISON:  08/03/2021 FINDINGS: Cardiovascular: Aortic atherosclerosis. Normal heart size. Extensive Three-vessel coronary artery calcifications. No pericardial effusion. Unchanged enlargement of the main pulmonary artery, measuring up to 3.6 cm in caliber. Mediastinum/Nodes: Unchanged prominent pretracheal lymph node, measuring up to 1.7 x 1.1 cm (series 2, image 51) no other enlarged mediastinal, hilar, or axillary lymph nodes. Thyroid gland, trachea, and esophagus demonstrate no significant findings. Lungs/Pleura: Mild paraseptal emphysema unchanged spiculated nodule of the posterior left apex, measuring 1.3 x 1.2 cm (series 5, image 23). Slight interval increase in bandlike consolidation of the paramedian left upper lobe (series 5, image 37). Interval increase in solidity of a 0.5 cm nodule of the superior segment left lower lobe (series 5, image 45). Interval resolution of a small nodule of the anterior right upper lobe seen on prior examination (series 5, image 34). No pleural effusion or pneumothorax. Upper Abdomen: No acute abnormality. Coarse, nodular contour of the liver with hypertrophy of the left lobe. Partially imaged splenomegaly. Unchanged prominent gastrohepatic ligament and celiac axis nodes (series 2, image 131). Musculoskeletal: No chest wall abnormality. No suspicious osseous lesions identified. IMPRESSION: 1. Unchanged spiculated nodule of the posterior left apex, measuring 1.3 x 1.2 cm, consistent with stable treated malignancy. 2. Slight interval increase in bandlike consolidation of the  paramedian left upper lobe, consistent with developing radiation fibrosis. 3. Interval increase in solidity of a 0.5 cm nodule of the superior segment left lower lobe, nonspecific although modestly suspicious for metastatic disease. Attention on follow-up. 4. Interval resolution of a small nodule of the anterior right upper lobe seen on prior examination. 5. Unchanged prominent pretracheal lymph node, as well as prominent gastrohepatic ligament and celiac axis lymph nodes in the included upper abdomen. Attention on follow-up. 6. Cirrhotic morphology of the liver and splenomegaly. 7. Unchanged enlargement of the main pulmonary artery, measuring up to 3.6 cm in caliber, as can be seen in pulmonary arterial hypertension. 8. Emphysema. 9. Coronary **Note De-Identified Lomax Obfuscation** artery disease. Aortic Atherosclerosis (ICD10-I70.0) and Emphysema (ICD10-J43.9). Electronically Signed   By: Delanna Ahmadi M.D.   On: 11/07/2021 14:47    ASSESSMENT AND PLAN: This is a very pleasant 66 years old white female recently diagnosed with unresectable stage IIb (T1c, N1, M0) non-small cell lung cancer, squamous cell carcinoma diagnosed in April 2022 and presented with left upper lobe lung nodule in addition to left hilar adenopathy. The patient completed a course of concurrent chemoradiation with weekly carboplatin for AUC of 2 and paclitaxel 45 Mg/M2 status post 6 cycles.   The patient has been in observation since that time. She is feeling fine except for the baseline shortness of breath. She had repeat CT scan of the chest performed recently. I personally and independently reviewed the scan images and discussed results with the patient and her husband. Her scan in general showed stable disease but there was increased and a 0.5 cm nodule in the superior segment of the left lower lobe that is nonspecific and we will continue to monitor it closely. For the persistent anemia, I advised her to start taking over-the-counter oral iron tablets and  multivitamins. I will see the patient back for follow-up visit in 3 months for evaluation with repeat CT scan of the chest. The patient was advised to call immediately if she has any concerning symptoms in the interval. The patient voices understanding of current disease status and treatment options and is in agreement with the current care plan.  All questions were answered. The patient knows to call the clinic with any problems, questions or concerns. We can certainly see the patient much sooner if necessary.  Disclaimer: This note was dictated with voice recognition software. Similar sounding words can inadvertently be transcribed and may not be corrected upon review.

## 2021-11-11 DIAGNOSIS — T85511A Breakdown (mechanical) of esophageal anti-reflux device, initial encounter: Secondary | ICD-10-CM | POA: Diagnosis not present

## 2021-11-11 DIAGNOSIS — J449 Chronic obstructive pulmonary disease, unspecified: Secondary | ICD-10-CM | POA: Diagnosis not present

## 2021-11-11 DIAGNOSIS — G4733 Obstructive sleep apnea (adult) (pediatric): Secondary | ICD-10-CM | POA: Diagnosis not present

## 2021-11-11 DIAGNOSIS — L400 Psoriasis vulgaris: Secondary | ICD-10-CM | POA: Diagnosis not present

## 2021-12-06 DIAGNOSIS — G4733 Obstructive sleep apnea (adult) (pediatric): Secondary | ICD-10-CM | POA: Diagnosis not present

## 2021-12-08 ENCOUNTER — Ambulatory Visit (INDEPENDENT_AMBULATORY_CARE_PROVIDER_SITE_OTHER): Payer: Medicare Other

## 2021-12-08 ENCOUNTER — Ambulatory Visit (INDEPENDENT_AMBULATORY_CARE_PROVIDER_SITE_OTHER): Payer: Medicare Other | Admitting: Family Medicine

## 2021-12-08 VITALS — BP 110/59 | HR 92 | Temp 98.2°F | Ht 65.0 in | Wt 203.4 lb

## 2021-12-08 DIAGNOSIS — R042 Hemoptysis: Secondary | ICD-10-CM

## 2021-12-08 DIAGNOSIS — R0781 Pleurodynia: Secondary | ICD-10-CM

## 2021-12-08 DIAGNOSIS — F418 Other specified anxiety disorders: Secondary | ICD-10-CM | POA: Diagnosis not present

## 2021-12-08 DIAGNOSIS — W19XXXA Unspecified fall, initial encounter: Secondary | ICD-10-CM | POA: Diagnosis not present

## 2021-12-08 DIAGNOSIS — Z720 Tobacco use: Secondary | ICD-10-CM

## 2021-12-08 DIAGNOSIS — R918 Other nonspecific abnormal finding of lung field: Secondary | ICD-10-CM | POA: Diagnosis not present

## 2021-12-08 MED ORDER — CEFDINIR 300 MG PO CAPS: 300.0000 mg | ORAL_CAPSULE | Freq: Two times a day (BID) | ORAL | 0 refills | Status: DC

## 2021-12-08 MED ORDER — FLUOXETINE HCL 20 MG PO CAPS: 20.0000 mg | ORAL_CAPSULE | Freq: Every day | ORAL | 3 refills | Status: AC

## 2021-12-08 MED ORDER — FLUCONAZOLE 150 MG PO TABS: 150.0000 mg | ORAL_TABLET | Freq: Once | ORAL | 0 refills | Status: AC

## 2021-12-08 NOTE — Progress Notes (Signed)
**Note De-Identified Hailey Obfuscation** Subjective: CC: Follow-up mood PCP: Hailey Norlander, DO QIO:[id number] [name] is a 66 y.o. female who is brought to the office by her husband.  She is presenting to clinic today for:  1.  Depression anxiety At last visit we advance her Prozac to 40 mg daily in efforts to improve her depression and anxiety.  She unfortunately started having bad dreams and it made her feel bad so she discontinued that dose and went back to her 20 mg dose.  She has been doing fine since  2.  Fall Patient reports that she fell out of her bed while asleep recently and has subsequently had some left axillary pain.  It hurts when she raises her left upper extremity or coughs hard.  3.  Cough Patient reports that she has been having cough with brown and bloody sputum over the last several days.  No fevers reported.  No shortness of breath or known sick contacts.  She has known lung cancer and is being treated for this.  She slowed down her smoking to 1-1 and half packs per day   ROS: Per HPI  Allergies  Allergen Reactions   Doxycycline Shortness Of Breath and Other (See Comments)    Drug interaction with Hailey Hernandez   Tramadol Shortness Of Breath    Did not work   Milnacipran     REACTION: dizzy   Apremilast Rash    Rash, able to take this currently   Past Medical History:  Diagnosis Date   Allergy    Anxiety disorder    Arthritis    bil legs   Asthma    Cancer (Hailey Hernandez)    Chronic bronchitis    Complication of anesthesia    slow to wake up, one time she turned blue in recovery   COPD (chronic obstructive pulmonary disease) (Hailey Hernandez)    Depression    Diverticulitis    DJD (degenerative joint disease)    spine   Dyspnea    O2 at 3 L 24/7   Fibromyalgia    GERD (gastroesophageal reflux disease)    Glaucoma    History of thyroid cancer    HTN (hypertension)    Hyperlipidemia    Impaired glucose tolerance 05/08/2013   Lung cancer (Hailey Hernandez)    OSA (obstructive sleep apnea)    CPAP  last sleep study  8-10 yr.ag0   Polymyalgia (Hailey Hernandez)    Pre-diabetes    Psoriasis    Rhinitis    Tobacco abuse     Current Outpatient Medications:    albuterol (PROVENTIL) (2.5 MG/3ML) 0.083% nebulizer solution, Take 3 mLs (2.5 mg total) by nebulization every 4 (four) hours as needed for wheezing or shortness of breath. DX: 496, Disp: 180 mL, Rfl: 12   albuterol (VENTOLIN HFA) 108 (90 Base) MCG/ACT inhaler, Inhale 2 puffs every 4-6 hors as needed- rescue, Disp: 18 g, Rfl: 12   alendronate (FOSAMAX) 70 MG tablet, Take 1 tablet (70 mg total) by mouth every 7 (seven) days. Take with a full glass of water on an empty stomach. Sunday, Disp: 12 tablet, Rfl: 3   aspirin EC 81 MG tablet, Take 81 mg by mouth daily., Disp: , Rfl:    atorvastatin (LIPITOR) 80 MG tablet, Take 1 tablet (80 mg total) by mouth daily., Disp: 90 tablet, Rfl: 3   Budeson-Glycopyrrol-Formoterol (BREZTRI AEROSPHERE) 160-9-4.8 MCG/ACT AERO, Inhale 2 puffs into the lungs 2 (two) times daily., Disp: 10.7 g, Rfl: 5   calcium carbonate (CALTRATE 600) 1500 (600 **Note De-Identified Fenn Obfuscation** Ca) MG TABS tablet, Take 1 tablet (1,500 mg total) by mouth 2 (two) times daily with a meal., Disp: 60 tablet, Rfl: 3   colchicine 0.6 MG tablet, Take 1 tablet (0.6 mg total) by mouth daily., Disp: 90 tablet, Rfl: 3   FLUoxetine (PROZAC) 40 MG capsule, Take 1 capsule (40 mg total) by mouth daily. For anxiety and depression, Disp: 90 capsule, Rfl: 3   fluticasone (FLONASE) 50 MCG/ACT nasal spray, Place 2 sprays into both nostrils daily., Disp: 16 g, Rfl: 6   lisinopril (ZESTRIL) 20 MG tablet, Take 1 tablet (20 mg total) by mouth daily., Disp: 90 tablet, Rfl: 3   montelukast (SINGULAIR) 10 MG tablet, TAKE 1 TABLET EVERY DAY, Disp: 90 tablet, Rfl: 1   Nebulizers (COMPRESSOR/NEBULIZER) MISC, Use up to 4 times daily when needed, Disp: 1 each, Rfl: 0   Respiratory Therapy Supplies (FLUTTER) DEVI, Patient to use device for ten minutes, three times daily, Disp: 1 each, Rfl: 0   TREMFYA 100 MG/ML SOPN,  Inject into the skin., Disp: , Rfl:    triamcinolone ointment (KENALOG) 0.1 %, Apply topically 3 (three) times daily., Disp: , Rfl:  Social History   Socioeconomic History   Marital status: Married    Spouse name: Not on file   Number of children: 3   Years of education: 9   Highest education level: Not on file  Occupational History   Occupation: disabled    Employer: DISABLED  Tobacco Use   Smoking status: Every Day    Packs/day: 1.00    Years: 45.00    Pack years: 45.00    Types: Cigarettes   Smokeless tobacco: Never   Tobacco comments:    Smokes 1/2 pack a day [facility] 08/04/2021  Vaping Use   Vaping Use: Never used  Substance and Sexual Activity   Alcohol use: No   Drug use: No   Sexual activity: Not Currently  Other Topics Concern   Not on file  Social History Narrative   Married w/ children   Daily caffeine use   Social Determinants of Health   Financial Resource Strain: Low Risk    Difficulty of Paying Living Expenses: Not hard at all  Food Insecurity: No Food Insecurity   Worried About Charity fundraiser in the Last Year: Never true   [place] in the Last Year: Never true  Transportation Needs: No Transportation Needs   Lack of Transportation (Medical): No   Lack of Transportation (Non-Medical): No  Physical Activity: Inactive   Days of Exercise per Week: 0 days   Minutes of Exercise per Session: 0 min  Stress: No Stress Concern Present   Feeling of Stress : Only a little  Social Connections: Moderately Integrated   Frequency of Communication with Friends and Family: More than three times a week   Frequency of Social Gatherings with Friends and Family: More than three times a week   Attends Religious Services: 1 to 4 times per year   Active Member of [employer] or Organizations: No   Attends Archivist Meetings: Never   Marital Status: Married  Human resources officer Violence: Not At Risk   Fear of Current or Ex-Partner: No   Emotionally Abused: No    Physically Abused: No   Sexually Abused: No   Family History  Problem Relation Age of Onset   Emphysema Sister    Hypertension Sister    Heart attack Sister    Emphysema Sister    Alpha-1 **Note De-Identified Hernandez Obfuscation** antitrypsin deficiency Sister    Alpha-1 antitrypsin deficiency Sister    Allergies Sister    Asthma Sister    Heart disease Mother    Cancer Mother    Hyperlipidemia Mother    Hypertension Mother    Heart attack Mother    Heart disease Father    Heart attack Father    Cancer Sister    Cancer Brother    Hyperlipidemia Brother    Hypertension Brother    Tuberculosis Other        grandmother   Hyperlipidemia Daughter    Hypertension Daughter    Hypertension Son     Objective: Office vital signs reviewed. BP (!) 110/59    Pulse 92    Temp 98.2 F (36.8 C) (Temporal)    Ht 5\' 5"  (1.651 m)    Wt 203 lb 6 oz (92.3 kg)    SpO2 96% Comment: on 3 Liters oxygen [place]   BMI 33.84 kg/m   Physical Examination:  General: Awake, alert, chronically ill-appearing female, No acute distress Cardio: regular rate and rhythm, S1S2 heard, no murmurs appreciated Pulm: Globally decreased breath sounds.  She has normal work of breathing on oxygen [place] nasal cannula.  She does have slightly more pronounced decrease in breath sounds on the left compared to the right MSK: Exquisite tenderness palpation along the ribs 4 and 5 under the mid axillary line.  No palpable deformity or step-off  Assessment/ Plan: 66 y.o. female   Depression with anxiety - Plan: FLUoxetine (PROZAC) 20 MG capsule  Tobacco use  Fall, initial encounter  Rib pain on left side - Plan: DG Ribs Unilateral Left  Hemoptysis - Plan: DG Chest 2 View, cefdinir (OMNICEF) 300 MG capsule, fluconazole (DIFLUCAN) 150 MG tablet  Depression is stable with 20 mg of Prozac.  Could not tolerate higher doses.  This has been renewed  Currently reducing tobacco use.  I congratulated her on this.  Encouraged cessation  Fall from bed.  Obtain plain  films given exquisite tenderness to palpation on exam today.  Chest x-ray ordered given reports of brown sputum hemoptysis.  She has known lung cancer and she is being treated for this.  However, given this new change I felt it pertinent to take a picture of the lungs to make sure that we do not have any obvious changes.  I have empirically treated her with Omnicef  No orders of the defined types were placed in this encounter.  No orders of the defined types were placed in this encounter.    Hailey Norlander, DO [place] [phone]

## 2021-12-09 DIAGNOSIS — T85511A Breakdown (mechanical) of esophageal anti-reflux device, initial encounter: Secondary | ICD-10-CM | POA: Diagnosis not present

## 2021-12-09 DIAGNOSIS — J449 Chronic obstructive pulmonary disease, unspecified: Secondary | ICD-10-CM | POA: Diagnosis not present

## 2021-12-09 DIAGNOSIS — L400 Psoriasis vulgaris: Secondary | ICD-10-CM | POA: Diagnosis not present

## 2021-12-09 DIAGNOSIS — G4733 Obstructive sleep apnea (adult) (pediatric): Secondary | ICD-10-CM | POA: Diagnosis not present

## 2021-12-11 ENCOUNTER — Other Ambulatory Visit: Payer: Self-pay | Admitting: Family Medicine

## 2021-12-11 DIAGNOSIS — Z1231 Encounter for screening mammogram for malignant neoplasm of breast: Secondary | ICD-10-CM

## 2021-12-14 ENCOUNTER — Encounter: Payer: Self-pay | Admitting: Internal Medicine

## 2021-12-14 ENCOUNTER — Ambulatory Visit: Payer: Medicare Other | Admitting: Internal Medicine

## 2021-12-14 ENCOUNTER — Ambulatory Visit (INDEPENDENT_AMBULATORY_CARE_PROVIDER_SITE_OTHER): Payer: Medicare Other

## 2021-12-14 ENCOUNTER — Other Ambulatory Visit: Payer: Self-pay

## 2021-12-14 VITALS — BP 120/60 | HR 100 | Temp 98.2°F | Ht 65.0 in | Wt 202.8 lb

## 2021-12-14 DIAGNOSIS — J9611 Chronic respiratory failure with hypoxia: Secondary | ICD-10-CM | POA: Diagnosis not present

## 2021-12-14 DIAGNOSIS — R0781 Pleurodynia: Secondary | ICD-10-CM

## 2021-12-14 DIAGNOSIS — J449 Chronic obstructive pulmonary disease, unspecified: Secondary | ICD-10-CM | POA: Diagnosis not present

## 2021-12-14 DIAGNOSIS — C3412 Malignant neoplasm of upper lobe, left bronchus or lung: Secondary | ICD-10-CM | POA: Diagnosis not present

## 2021-12-14 DIAGNOSIS — G4733 Obstructive sleep apnea (adult) (pediatric): Secondary | ICD-10-CM

## 2021-12-14 DIAGNOSIS — Z720 Tobacco use: Secondary | ICD-10-CM

## 2021-12-14 DIAGNOSIS — R0789 Other chest pain: Secondary | ICD-10-CM | POA: Diagnosis not present

## 2021-12-14 MED ORDER — BREZTRI AEROSPHERE 160-9-4.8 MCG/ACT IN AERO: 2.0000 | INHALATION_SPRAY | Freq: Two times a day (BID) | RESPIRATORY_TRACT | 12 refills | Status: AC

## 2021-12-14 MED ORDER — ALBUTEROL SULFATE HFA 108 (90 BASE) MCG/ACT IN AERS: INHALATION_SPRAY | RESPIRATORY_TRACT | 12 refills | Status: AC

## 2021-12-14 NOTE — Assessment & Plan Note (Signed)
**Note De-Identified [place]cca Obfuscation** Benefits from CPAP with good compliance and control ?Plan-continue auto 5-15 with supplemental O2 ?

## 2021-12-14 NOTE — Patient Instructions (Addendum)
**Note De-Identified [place]right Obfuscation** Refill scripts sent for Ventiolin and Breztri ? ?Order- CXR with left rib details      left chest wall pain after fall ? ?Ok to try a heating pad, advil and tylenol to help pain ? ?Continue CPAP ? ?Continue oxygen ? ?Please keep trying to stop smoking ?

## 2021-12-14 NOTE — Progress Notes (Signed)
**Note De-Identified Blevens Obfuscation** ? ? Patient ID: Hailey Hernandez, female    DOB: Nov 25, 1955, 66 y.o.   MRN: [id number] ? ?HPI ?female smoker followed for chronic bronchitis/COPD, chronic hypoxic respiratory failure, tobacco use (1 PPD/45 pack years), OSA, obesity hypoventilation, complicated by psoriasis, Squamous Cell CA lung 2022, Cirrhosis, Multi[ple Lung Nodules, SqCell Lung CA,  ?O2 2-3 L sleep/exertion, CPAP 13/[facility] ?Office Spirometry-06/07/17-severe obstructive airways disease, severe restriction of exhaled volume. FVC 1.18/37%, FEV1 0.90/36%, ratio 0.76, FEF 25-75% 0.73/33% ?CT chest 12/30/20- Cavitary LUL nodule and multiple small nodules ?Navigation Bronchoscopy Dr Valeta Harms 01/24/21- for LUL nodule met to L hilum by PET ?----------------------------------------------------------------------------------------- ? ? ?08/04/21- 66 year old female smoker  followed for chronic bronchitis/COPD, chronic hypoxic respiratory failure, Tobacco use (1 PPD/45 pack years), OSA, obesity hypoventilation, complicated by psoriasis, DM2, SCLung Ca, Cirrhosis, lung nodules, Anemia,  ?-Flutter device, Breztri, albuterol hfa, neb albuterol, Singulair,  ?-O2 3 L continuous ?CPAP 5-15/[facility]                                                 Husband here ?Download compliance 100%, AHI 1.2/ hr ?Body weight today  ?Covid vax- 1J&J, 1 Moderna ?Flu vax- today ?Completed XRT for SCCA LUL ?Lab- Hgb 10.8 ?Still smoking 1/2 PPD. ?Thinks a bit more short of breath but otherwise no change and no acute event. Cough productive white- no blood. Stopped smoking for a month on nicotine patches but got contact dermatitis and stopped those. ?Discussed pharmacy smoking cessation program. ?I reviewed CT but will leave it to her Oncologist to discuss with her. ?CT chest 08/03/21-  ?IMPRESSION: ?1. Irregular solid 1.4 cm apical left upper lobe pulmonary nodule, ?slightly decreased in size since 04/27/2021 chest CT, compatible ?with response to therapy. New mild patchy left upper lobe and ?superior  segment left lower lobe ground-glass opacities, favor early ?postradiation change. ?2. New indistinct 0.5 cm nodular opacities in the peripheral right ?upper lobe and superior segment left lower lobe, indeterminate for ?inflammatory foci versus metastatic disease. Short-term follow-up ?chest CT recommended in 3 months. ?3. Stable mild mediastinal and gastrohepatic ligament ?lymphadenopathy. ?4. Stable dilated main pulmonary artery, suggesting chronic ?pulmonary arterial hypertension. ?5. Three-vessel coronary atherosclerosis. ?6. Liver surface appears finely irregular, cannot exclude cirrhosis. ?7. Aortic Atherosclerosis (ICD10-I70.0). ? ?12/14/21- 66 year old female smoker  followed for chronic bronchitis/COPD, chronic hypoxic respiratory failure, Tobacco use (1 PPD/45 pack years), OSA, obesity hypoventilation, complicated by psoriasis, DM2, SCLung Ca, Cirrhosis, lung nodules, Anemia, Depression/ Anxiety,  ?-Flutter device, Breztri, albuterol hfa, neb albuterol, Singulair,  ?-O2 3 L continuous ?CPAP 5-15/[facility]                                                 Husband here ?Download compliance 97%, AHI 1.5/ hr ?Body weight today  ?Covid vax- 1J&J, 1 Moderna ?Flu vax- today ?Completed XRT for SCCA LUL. Ongoing f/u by Dr Julien Nordmann ?-------Patient has been having pain on left side for about 2 weeks states it feels like pleurisy. Also states that she fell out of bed about 2 weeks ago. PCP did xray showed nothing. Wears 3 liters oxygen all the time. ?She indicates pain/tenderness to touch and deep breath in the left mid axillary line which has gotten worse rather than better since **Note De-Identified Yusupov Obfuscation** her fall.  Breathing is otherwise unchanged. ?Compliant with CPAP.  Download reviewed. ?Continues follow-up with Oncology for her lung cancer. ?CXR- 12/08/21 ?IMPRESSION: ?1. No displaced rib fracture or other acute osseous abnormality ?identified. ?2. Bandlike opacity in the left upper lobe likely reflecting ?scarring/fibrosis. No evidence of acute  cardiopulmonary process. ? ? ?Review of Systems-see HPI   + = positive ?Constitutional:   No-   weight loss, night sweats, fevers, chills, fatigue, lassitude. ?HEENT:    headaches, difficulty swallowing, tooth/dental problems, sore throat,  ?      sneezing, itching,  ear ache,  +nasal congestion, +post nasal drip,  ?CV:  No-   chest pain, orthopnea, PND, swelling in lower extremities, anasarca,   dizziness, palpitations ?Resp: +  shortness of breath with exertion or at rest.   ?            +productive cough,  + non-productive cough,  No- coughing up of blood.   ?           change in color of mucus.  + wheezing.   ?Skin: +psoriasis ?GI:  No-   heartburn, indigestion, abdominal pain, nausea, vomiting,  ?GU: No-    ?MS:  + joint pain or swelling. Marland Kitchen ?Neuro-     nothing unusual ?Psych:  No- change in mood or affect. No depression or anxiety.  No memory loss. ? ? ?Objective:  ? Physical Exam ?General- Alert, Oriented, Affect-appropriate, Distress- none acute, +obese.  ?On 3L POC O2 sat 94% ?Skin- + psoriasis plaques- flared since last visit ?Lymphadenopathy- none ?Head- atraumatic ?           Eyes- Gross vision intact, PERRLA, conjunctivae clear secretions ?           Ears- Hearing ok ?           Nose-  no-Septal dev, mucus, polyps, erosion, perforation  ?           Throat- Mallampati III , mucosa clear , drainage- none, tonsils- atrophic.  ?                      +dentures ?Neck- flexible , trachea midline, no stridor , thyroid nl, carotid no bruit ?Chest - symmetrical excursion , unlabored ?          Heart/CV- RRR , no murmur , no gallop  , no rub, nl s1 s2 ?                          - JVD- none , edema- none, stasis dermatitis changes+ bilateral,  ?                               varices- none ?          Lung- +Diminished,  Wheeze- none, dullness-none,                          ?                          rub- none, , cough + loose ?          Chest wall- + tender left mid axillary line where indicated, without  crepitus ?Abd-  ?Br/ Gen/ Rectal- Not done, not indicated ?Extrem- +apparent lipoma right medial knee with +stasis changes,  ?Neuro- grossly **Note De-Identified Mulnix Obfuscation** intact to observation ? ? ?

## 2021-12-14 NOTE — Assessment & Plan Note (Signed)
**Note De-Identified Coyle Obfuscation** Chest wall pain consistent with known fall from bed 2 weeks ago.  Husband has now put up a bed rail.  This is probably a nondisplaced fracture or muscle tear. ?Plan-while she is here and complaining of increasing pain, we discussed Advil, Tylenol, heating pad, brace and we will update chest x-ray. ?

## 2021-12-14 NOTE — Assessment & Plan Note (Signed)
**Note De-Identified Myszka Obfuscation** No recent exacerbation. ?Plan-refilling inhalers as requested ?

## 2021-12-14 NOTE — Assessment & Plan Note (Signed)
**Note De-Identified Marcos Obfuscation** Unfortunately despite multiple efforts at counseling and opportunities for support she is not making a committed effort to smoking cessation.  We will keep trying to help her. ?

## 2021-12-14 NOTE — Assessment & Plan Note (Signed)
**Note De-Identified Nason Obfuscation** She continues to depend on supplemental oxygen at 3 L.  Comfortable here with 3 L pulse POC ?Plan-continue O2 ?

## 2021-12-18 NOTE — Progress Notes (Signed)
**Note De-identified Zebrowski Obfuscation** LMTCB

## 2021-12-25 ENCOUNTER — Other Ambulatory Visit: Payer: Self-pay

## 2021-12-25 ENCOUNTER — Ambulatory Visit
Admission: RE | Admit: 2021-12-25 | Discharge: 2021-12-25 | Disposition: A | Discharge status: Home / Self Care | Payer: Medicare Other | Source: Ambulatory Visit | Attending: Family Medicine | Admitting: Family Medicine

## 2021-12-25 DIAGNOSIS — Z1231 Encounter for screening mammogram for malignant neoplasm of breast: Secondary | ICD-10-CM

## 2021-12-27 ENCOUNTER — Telehealth: Payer: Self-pay | Admitting: Internal Medicine

## 2021-12-27 NOTE — Telephone Encounter (Signed)
**Note De-Identified [place] Obfuscation** Deneise Lever, MD  ?12/18/2021 10:44 AM EDT   ?  ?CXR- stable. Old shadows in upper left as seen before on CT, with nothing new or active noted.   ? ? ?Patient is aware of results and voiced her understanding.  ?Nothing further needed.  ? ?

## 2022-01-08 DIAGNOSIS — J449 Chronic obstructive pulmonary disease, unspecified: Secondary | ICD-10-CM | POA: Diagnosis not present

## 2022-01-08 DIAGNOSIS — L409 Psoriasis, unspecified: Secondary | ICD-10-CM | POA: Diagnosis not present

## 2022-01-08 DIAGNOSIS — M2550 Pain in unspecified joint: Secondary | ICD-10-CM | POA: Diagnosis not present

## 2022-01-08 DIAGNOSIS — Z79899 Other long term (current) drug therapy: Secondary | ICD-10-CM | POA: Diagnosis not present

## 2022-01-09 DIAGNOSIS — L400 Psoriasis vulgaris: Secondary | ICD-10-CM | POA: Diagnosis not present

## 2022-01-09 DIAGNOSIS — T85511A Breakdown (mechanical) of esophageal anti-reflux device, initial encounter: Secondary | ICD-10-CM | POA: Diagnosis not present

## 2022-01-09 DIAGNOSIS — G4733 Obstructive sleep apnea (adult) (pediatric): Secondary | ICD-10-CM | POA: Diagnosis not present

## 2022-01-09 DIAGNOSIS — J449 Chronic obstructive pulmonary disease, unspecified: Secondary | ICD-10-CM | POA: Diagnosis not present

## 2022-01-24 ENCOUNTER — Encounter: Payer: Self-pay | Admitting: Hematology

## 2022-01-24 ENCOUNTER — Telehealth: Payer: Self-pay | Admitting: Internal Medicine

## 2022-01-24 NOTE — Telephone Encounter (Signed)
**Note De-identified Barton Obfuscation** Called patient regarding upcoming appointments, left a voicemail. 

## 2022-01-25 ENCOUNTER — Ambulatory Visit (INDEPENDENT_AMBULATORY_CARE_PROVIDER_SITE_OTHER): Payer: Medicare Other | Admitting: Family

## 2022-01-25 ENCOUNTER — Encounter: Payer: Self-pay | Admitting: Family

## 2022-01-25 VITALS — BP 139/72 | HR 111 | Temp 98.1°F | Ht 62.0 in | Wt 197.0 lb

## 2022-01-25 DIAGNOSIS — J019 Acute sinusitis, unspecified: Secondary | ICD-10-CM | POA: Diagnosis not present

## 2022-01-25 DIAGNOSIS — J9611 Chronic respiratory failure with hypoxia: Secondary | ICD-10-CM | POA: Diagnosis not present

## 2022-01-25 MED ORDER — AMOXICILLIN-POT CLAVULANATE 875-125 MG PO TABS: 1.0000 | ORAL_TABLET | Freq: Two times a day (BID) | ORAL | 0 refills | Status: DC

## 2022-01-25 NOTE — Patient Instructions (Signed)
**Note De-identified Stull Obfuscation** Sinus Infection, Adult A sinus infection, also called sinusitis, is inflammation of your sinuses. Sinuses are hollow spaces in the bones around your face. Your sinuses are located: Around your eyes. In the middle of your forehead. Behind your nose. In your cheekbones. Mucus normally drains out of your sinuses. When your nasal tissues become inflamed or swollen, mucus can become trapped or blocked. This allows bacteria, viruses, and fungi to grow, which leads to infection. Most infections of the sinuses are caused by a virus. A sinus infection can develop quickly. It can last for up to 4 weeks (acute) or for more than 12 weeks (chronic). A sinus infection often develops after a cold. What are the causes? This condition is caused by anything that creates swelling in the sinuses or stops mucus from draining. This includes: Allergies. Asthma. Infection from bacteria or viruses. Deformities or blockages in your nose or sinuses. Abnormal growths in the nose (nasal polyps). Pollutants, such as chemicals or irritants in the air. Infection from fungi. This is rare. What increases the risk? You are more likely to develop this condition if you: Have a weak body defense system (immune system). Do a lot of swimming or diving. Overuse nasal sprays. Smoke. What are the signs or symptoms? The main symptoms of this condition are pain and a feeling of pressure around the affected sinuses. Other symptoms include: Stuffy nose or congestion that makes it difficult to breathe through your nose. Thick yellow or greenish drainage from your nose. Tenderness, swelling, and warmth over the affected sinuses. A cough that may get worse at night. Decreased sense of smell and taste. Extra mucus that collects in the throat or the back of the nose (postnasal drip) causing a sore throat or bad breath. Tiredness (fatigue). Fever. How is this diagnosed? This condition is diagnosed based on: Your symptoms. Your  medical history. A physical exam. Tests to find out if your condition is acute or chronic. This may include: Checking your nose for nasal polyps. Viewing your sinuses using a device that has a light (endoscope). Testing for allergies or bacteria. Imaging tests, such as an MRI or CT scan. In rare cases, a bone biopsy may be done to rule out more serious types of fungal sinus disease. How is this treated? Treatment for a sinus infection depends on the cause and whether your condition is chronic or acute. If caused by a virus, your symptoms should go away on their own within 10 days. You may be given medicines to relieve symptoms. They include: Medicines that shrink swollen nasal passages (decongestants). A spray that eases inflammation of the nostrils (topical intranasal corticosteroids). Rinses that help get rid of thick mucus in your nose (nasal saline washes). Medicines that treat allergies (antihistamines). Over-the-counter pain relievers. If caused by bacteria, your health care provider may recommend waiting to see if your symptoms improve. Most bacterial infections will get better without antibiotic medicine. You may be given antibiotics if you have: A severe infection. A weak immune system. If caused by narrow nasal passages or nasal polyps, surgery may be needed. Follow these instructions at home: Medicines Take, use, or apply over-the-counter and prescription medicines only as told by your health care provider. These may include nasal sprays. If you were prescribed an antibiotic medicine, take it as told by your health care provider. Do not stop taking the antibiotic even if you start to feel better. Hydrate and humidify  Drink enough fluid to keep your urine pale yellow. Staying hydrated will help  **Note De-identified Ranney Obfuscation** to thin your mucus. Use a cool mist humidifier to keep the humidity level in your home above 50%. Inhale steam for 10-15 minutes, 3-4 times a day, or as told by your health care  provider. You can do this in the bathroom while a hot shower is running. Limit your exposure to cool or dry air. Rest Rest as much as possible. Sleep with your head raised (elevated). Make sure you get enough sleep each night. General instructions  Apply a warm, moist washcloth to your face 3-4 times a day or as told by your health care provider. This will help with discomfort. Use nasal saline washes as often as told by your health care provider. Wash your hands often with soap and water to reduce your exposure to germs. If soap and water are not available, use hand sanitizer. Do not smoke. Avoid being around people who are smoking (secondhand smoke). Keep all follow-up visits. This is important. Contact a health care provider if: You have a fever. Your symptoms get worse. Your symptoms do not improve within 10 days. Get help right away if: You have a severe headache. You have persistent vomiting. You have severe pain or swelling around your face or eyes. You have vision problems. You develop confusion. Your neck is stiff. You have trouble breathing. These symptoms may be an emergency. Get help right away. Call 911. Do not wait to see if the symptoms will go away. Do not drive yourself to the hospital. Summary A sinus infection is soreness and inflammation of your sinuses. Sinuses are hollow spaces in the bones around your face. This condition is caused by nasal tissues that become inflamed or swollen. The swelling traps or blocks the flow of mucus. This allows bacteria, viruses, and fungi to grow, which leads to infection. If you were prescribed an antibiotic medicine, take it as told by your health care provider. Do not stop taking the antibiotic even if you start to feel better. Keep all follow-up visits. This is important. This information is not intended to replace advice given to you by your health care provider. Make sure you discuss any questions you have with your health  care provider. Document Revised: 08/22/2021 Document Reviewed: 08/22/2021 Elsevier Patient Education  2023 [employer]  

## 2022-01-25 NOTE — Progress Notes (Signed)
**Note De-Identified [place] [facility]** ? ?Subjective:  ? ? Patient ID: Hailey Hernandez, female    DOB: [birth year], 66 y.o.   MRN: [id number] ? ?Chief Complaint  ?Patient presents with  ? URI  ?  Started 2 weeks ago, chills, sob, ha, vomiting and diarrhea  ? ?PT presents to the office today with sinus complaints for the last two weeks. She is on 3L of O2 continuously.  ?Sinusitis ?This is a new problem. The current episode started 1 to 4 weeks ago. The problem has been gradually worsening since onset. Maximum temperature: 99. The pain is moderate. Associated symptoms include chills, congestion, coughing, headaches, a hoarse voice, shortness of breath, sinus pressure, sneezing and a sore throat. Pertinent negatives include no ear pain. Past treatments include acetaminophen and oral decongestants. The treatment provided mild relief.  ? ? ? ?Review of Systems  ?Constitutional:  Positive for chills.  ?HENT:  Positive for congestion, hoarse voice, sinus pressure, sneezing and sore throat. Negative for ear pain.   ?Respiratory:  Positive for cough and shortness of breath.   ?Neurological:  Positive for headaches.  ?All other systems reviewed and are negative. ? ?   ?Objective:  ? Physical Exam ?Vitals reviewed.  ?Constitutional:   ?   General: She is not in acute distress. ?   Appearance: She is well-developed.  ?HENT:  ?   Head: Normocephalic and atraumatic.  ?   Right Ear: External ear normal.  ?   Nose:  ?   Right Turbinates: Enlarged.  ?   Left Turbinates: Enlarged.  ?   Right Sinus: Maxillary sinus tenderness and frontal sinus tenderness present.  ?   Left Sinus: Maxillary sinus tenderness and frontal sinus tenderness present.  ?Eyes:  ?   Pupils: Pupils are equal, round, and reactive to light.  ?Neck:  ?   Thyroid: No thyromegaly.  ?Cardiovascular:  ?   Rate and Rhythm: Normal rate and regular rhythm.  ?   Heart sounds: Normal heart sounds. No murmur heard. ?Pulmonary:  ?   Effort: Pulmonary effort is normal. No respiratory distress.  ?   Breath sounds:  Rhonchi present. No wheezing.  ?   Comments: 3 L of O2 ?Abdominal:  ?   General: Bowel sounds are normal. There is no distension.  ?   Palpations: Abdomen is soft.  ?   Tenderness: There is no abdominal tenderness.  ?Musculoskeletal:     ?   General: No tenderness. Normal range of motion.  ?   Cervical back: Normal range of motion and neck supple.  ?Skin: ?   General: Skin is warm and dry.  ?Neurological:  ?   Mental Status: She is alert and oriented to person, place, and time.  ?   Cranial Nerves: No cranial nerve deficit.  ?   Deep Tendon Reflexes: Reflexes are normal and symmetric.  ?Psychiatric:     ?   Behavior: Behavior normal.     ?   Thought Content: Thought content normal.     ?   Judgment: Judgment normal.  ? ? ? ? ?BP 139/72   Pulse (!) 111   Temp 98.1 ?F (36.7 ?C)   Ht 5\' 2"  (1.575 m)   Wt 197 lb (89.4 kg)   SpO2 91%   BMI 36.03 kg/m?  ? ?   ?Assessment & Plan:  ?Hailey Hernandez comes in today with chief complaint of URI (Started 2 weeks ago, chills, sob, ha, vomiting and diarrhea) ? ? ?Diagnosis and orders addressed: ? ? **Note De-Identified [place] [facility]** 1. Acute sinusitis, recurrence not specified, unspecified location ?- Take meds as prescribed ?- Use a cool mist humidifier  ?-Use saline nose sprays frequently ?-Force fluids ?-For any cough or congestion ? Use plain Mucinex- regular strength or max strength is fine ?-For fever or aces or pains- take tylenol or ibuprofen. ?-Throat lozenges if help ?-New toothbrush in 3 days ?- amoxicillin-clavulanate (AUGMENTIN) 875-125 MG tablet; Take 1 tablet by mouth 2 (two) times daily.  Dispense: 14 tablet; Refill: 0 ? ?2. Chronic respiratory failure with hypoxia (HCC) ? ? ?Evelina Dun, FNP ? ? ? ?

## 2022-01-31 ENCOUNTER — Encounter: Payer: Self-pay | Admitting: Family Medicine

## 2022-01-31 ENCOUNTER — Ambulatory Visit (INDEPENDENT_AMBULATORY_CARE_PROVIDER_SITE_OTHER): Payer: Medicare Other | Admitting: Family Medicine

## 2022-01-31 DIAGNOSIS — J0100 Acute maxillary sinusitis, unspecified: Secondary | ICD-10-CM

## 2022-01-31 MED ORDER — AMOXICILLIN-POT CLAVULANATE 875-125 MG PO TABS: 1.0000 | ORAL_TABLET | Freq: Two times a day (BID) | ORAL | 0 refills | Status: DC

## 2022-01-31 NOTE — Progress Notes (Signed)
**Note De-Identified [place] [facility]** ? ?Subjective: ?CC: Follow-up sinusitis ?PCP: [name], DO ?PFX:[id number] Hailey Hernandez is a 66 y.o. female presenting to clinic today for: ? ?1.  Sinusitis ?Patient was seen on 01/25/2022 for maxillary sinusitis.  She reports ongoing left-sided maxillary pain radiating down into the gumline.  She does note over the last couple days things have gotten slightly better but have really not been substantially better even with the Augmentin.  She does report some nasal bleeding that is been occurring over the last 2 days.  Not sure if it was on the left or right though.  She reports some diarrhea, abdominal pain.  No reports of vomiting.  She sees her cancer doctor in the next few days and has preexamination testing tomorrow ? ? ?ROS: Per HPI ? ?Allergies  ?Allergen Reactions  ? Doxycycline Shortness Of Breath and Other (See Comments)  ?  Drug interaction with Soriatane  ? Tramadol Shortness Of Breath  ?  Did not work  ? Milnacipran   ?  REACTION: dizzy  ? Apremilast Rash  ?  Rash, able to take this currently  ? ?Past Medical History:  ?Diagnosis Date  ? Allergy   ? Anxiety disorder   ? Arthritis   ? bil legs  ? Asthma   ? Cancer [facility])   ? Chronic bronchitis   ? Complication of anesthesia   ? slow to wake up, one time she turned blue in recovery  ? COPD (chronic obstructive pulmonary disease) ([place])   ? Depression   ? Diverticulitis   ? DJD (degenerative joint disease)   ? spine  ? Dyspnea   ? O2 at 3 L 24/7  ? Fibromyalgia   ? GERD (gastroesophageal reflux disease)   ? Glaucoma   ? History of thyroid cancer   ? HTN (hypertension)   ? Hyperlipidemia   ? Impaired glucose tolerance 05/08/2013  ? Lung cancer ([place])   ? OSA (obstructive sleep apnea)   ? CPAP  last sleep study 8-10 yr.ag0  ? Polymyalgia ([place])   ? Pre-diabetes   ? Psoriasis   ? Rhinitis   ? Tobacco abuse   ? ? ?Current Outpatient Medications:  ?  albuterol (PROVENTIL) (2.5 MG/3ML) 0.083% nebulizer solution, Take 3 mLs (2.5 mg total) by nebulization every 4  (four) hours as needed for wheezing or shortness of breath. DX: 496, Disp: 180 mL, Rfl: 12 ?  albuterol (VENTOLIN HFA) 108 (90 Base) MCG/ACT inhaler, Inhale 2 puffs every 4-6 hors as needed- rescue, Disp: 18 g, Rfl: 12 ?  alendronate (FOSAMAX) 70 MG tablet, Take 1 tablet (70 mg total) by mouth every 7 (seven) days. Take with a full glass of water on an empty stomach. Sunday, Disp: 12 tablet, Rfl: 3 ?  amoxicillin-clavulanate (AUGMENTIN) 875-125 MG tablet, Take 1 tablet by mouth 2 (two) times daily., Disp: 14 tablet, Rfl: 0 ?  aspirin EC 81 MG tablet, Take 81 mg by mouth daily., Disp: , Rfl:  ?  atorvastatin (LIPITOR) 80 MG tablet, Take 1 tablet (80 mg total) by mouth daily., Disp: 90 tablet, Rfl: 3 ?  Budeson-Glycopyrrol-Formoterol (BREZTRI AEROSPHERE) 160-9-4.8 MCG/ACT AERO, Inhale 2 puffs into the lungs 2 (two) times daily., Disp: 10.7 g, Rfl: 12 ?  calcium carbonate (CALTRATE 600) 1500 (600 Ca) MG TABS tablet, Take 1 tablet (1,500 mg total) by mouth 2 (two) times daily with a meal., Disp: 60 tablet, Rfl: 3 ?  colchicine 0.6 MG tablet, Take 1 tablet (0.6 mg total) by mouth daily., Disp: **Note De-Identified [place] [facility]bfuscation** 90 tablet, Rfl: 3 ?  FLUoxetine (PROZAC) 20 MG capsule, Take 1 capsule (20 mg total) by mouth daily., Disp: 90 capsule, Rfl: 3 ?  fluticasone (FLONASE) 50 MCG/ACT nasal spray, Place 2 sprays into both nostrils daily., Disp: 16 g, Rfl: 6 ?  lisinopril (ZESTRIL) 20 MG tablet, Take 1 tablet (20 mg total) by mouth daily., Disp: 90 tablet, Rfl: 3 ?  montelukast (SINGULAIR) 10 MG tablet, TAKE 1 TABLET EVERY DAY, Disp: 90 tablet, Rfl: 1 ?  Nebulizers (COMPRESSOR/NEBULIZER) MISC, Use up to 4 times daily when needed, Disp: 1 each, Rfl: 0 ?  Respiratory Therapy Supplies (FLUTTER) DEVI, Patient to use device for ten minutes, three times daily, Disp: 1 each, Rfl: 0 ?  TREMFYA 100 MG/ML SOPN, Inject into the skin., Disp: , Rfl:  ?  triamcinolone ointment (KENALOG) 0.1 %, Apply topically 3 (three) times daily., Disp: , Rfl:  ?Social History   ? ?Socioeconomic History  ? Marital status: Married  ?  Spouse name: Not on file  ? Number of children: 3  ? Years of education: 38  ? Highest education level: Not on file  ?Occupational History  ? Occupation: disabled  ?  Employer: DISABLED  ?Tobacco Use  ? Smoking status: Every Day  ?  Packs/day: 1.00  ?  Years: 45.00  ?  Pack years: 45.00  ?  Types: Cigarettes  ? Smokeless tobacco: Never  ? Tobacco comments:  ?  Smokes 1 pack a day [facility] 12/14/2021  ?Vaping Use  ? Vaping Use: Never used  ?Substance and Sexual Activity  ? Alcohol use: No  ? Drug use: No  ? Sexual activity: Not Currently  ?Other Topics Concern  ? Not on file  ?Social History Narrative  ? Married w/ children  ? Daily caffeine use  ? ?Social Determinants of Health  ? ?Financial Resource Strain: Low Risk   ? Difficulty of Paying Living Expenses: Not hard at all  ?Food Insecurity: No Food Insecurity  ? Worried About Charity fundraiser in the Last Year: Never true  ? Ran Out of Food in the Last Year: Never true  ?Transportation Needs: No Transportation Needs  ? Lack of Transportation (Medical): No  ? Lack of Transportation (Non-Medical): No  ?Physical Activity: Inactive  ? Days of Exercise per Week: 0 days  ? Minutes of Exercise per Session: 0 min  ?Stress: No Stress Concern Present  ? Feeling of Stress : Only a little  ?Social Connections: Moderately Integrated  ? Frequency of Communication with Friends and Family: More than three times a week  ? Frequency of Social Gatherings with Friends and Family: More than three times a week  ? Attends Religious Services: 1 to 4 times per year  ? Active Member of Clubs or Organizations: No  ? Attends Archivist Meetings: Never  ? Marital Status: Married  ?Intimate Partner Violence: Not At Risk  ? Fear of Current or Ex-Partner: No  ? Emotionally Abused: No  ? Physically Abused: No  ? Sexually Abused: No  ? ?Family History  ?Problem Relation Age of Onset  ? Heart disease Mother   ? Cancer Mother   ?  Hyperlipidemia Mother   ? Hypertension Mother   ? Heart attack Mother   ? Heart disease Father   ? Heart attack Father   ? Emphysema Sister   ? Hypertension Sister   ? Heart attack Sister   ? Emphysema Sister   ? Alpha-1 antitrypsin deficiency Sister   ? **Note De-Identified [place] [facility]uscation** Alpha-1 antitrypsin deficiency Sister   ? Allergies Sister   ? Asthma Sister   ? Cancer Sister   ? Hyperlipidemia Daughter   ? Hypertension Daughter   ? Cancer Brother   ? Hyperlipidemia Brother   ? Hypertension Brother   ? Hypertension Son   ? Tuberculosis Other   ?     grandmother  ? Breast cancer Neg Hx   ? ? ?Objective: ?Office vital signs reviewed. ?BP (!) 145/81   Pulse (!) 104   Temp 97.6 ?F (36.4 ?Hailey)   Ht 5\' 2"  (1.575 m)   Wt 197 lb (89.4 kg)   SpO2 (!) 87%   BMI 36.03 kg/m?  ? ?Physical Examination:  ?General: Awake, alert, nontoxic, chronically ill-appearing female, No acute distress ?HEENT: Left-sided maxillary tenderness present.  No facial swelling ?   Eyes: PERRLA, extraocular membranes intact, sclera white ?   Nose: nasal turbinates moist, clear nasal discharge.  Left nare with narrowing.  Nasal turbinates edematous bilaterally ?   Throat: moist mucus membranes, no erythema, no tonsillar exudate.  Airway is patent ?Cardio: regular rate and rhythm, S1S2 heard, no murmurs appreciated ?Pulm: Globally decreased breath sounds.  Normal work of breathing on oxygen [place] nasal cannula ? ?Assessment/ Plan: ?66 y.o. female  ? ?Subacute maxillary sinusitis - Plan: amoxicillin-clavulanate (AUGMENTIN) 875-125 MG tablet ? ?Slightly improving maxillary sinusitis.  Am going to extend her for another week given immunocompromise state with lung cancer and ongoing symptoms.  We discussed humidification, use of nasal saline spray for nasal bleeds.  Advised against use of Flonase for the next few days as this can also exacerbate stomach bleeds.  Probiotics/use of yogurt reinforced as she is likely having some antibiotic induced diarrhea. ? ?No orders of the defined  types were placed in this encounter. ? ?No orders of the defined types were placed in this encounter. ? ? ? ?[name], DO ?[facility] ?[phone] ? ?

## 2022-01-31 NOTE — Patient Instructions (Signed)
**Note De-Identified [place] [facility]uscation** Humidifier ?Nasal saline gel ?HOLD flonase. This causes nose bleeds ?Augmentin for another week ?Use yogurt to help with stomach issues ?

## 2022-02-01 ENCOUNTER — Other Ambulatory Visit: Payer: Self-pay

## 2022-02-01 ENCOUNTER — Inpatient Hospital Stay: Payer: Medicare Other | Attending: Internal Medicine

## 2022-02-01 ENCOUNTER — Ambulatory Visit (HOSPITAL_COMMUNITY)
Admission: RE | Admit: 2022-02-01 | Discharge: 2022-02-01 | Disposition: A | Discharge status: Home / Self Care | Payer: Medicare Other | Source: Ambulatory Visit | Attending: Internal Medicine | Admitting: Internal Medicine

## 2022-02-01 DIAGNOSIS — R590 Localized enlarged lymph nodes: Secondary | ICD-10-CM | POA: Insufficient documentation

## 2022-02-01 DIAGNOSIS — Z79899 Other long term (current) drug therapy: Secondary | ICD-10-CM | POA: Insufficient documentation

## 2022-02-01 DIAGNOSIS — E785 Hyperlipidemia, unspecified: Secondary | ICD-10-CM | POA: Insufficient documentation

## 2022-02-01 DIAGNOSIS — I10 Essential (primary) hypertension: Secondary | ICD-10-CM | POA: Insufficient documentation

## 2022-02-01 DIAGNOSIS — Z9049 Acquired absence of other specified parts of digestive tract: Secondary | ICD-10-CM | POA: Insufficient documentation

## 2022-02-01 DIAGNOSIS — R5383 Other fatigue: Secondary | ICD-10-CM | POA: Insufficient documentation

## 2022-02-01 DIAGNOSIS — R911 Solitary pulmonary nodule: Secondary | ICD-10-CM | POA: Diagnosis not present

## 2022-02-01 DIAGNOSIS — Z923 Personal history of irradiation: Secondary | ICD-10-CM | POA: Insufficient documentation

## 2022-02-01 DIAGNOSIS — C3490 Malignant neoplasm of unspecified part of unspecified bronchus or lung: Secondary | ICD-10-CM | POA: Insufficient documentation

## 2022-02-01 DIAGNOSIS — J449 Chronic obstructive pulmonary disease, unspecified: Secondary | ICD-10-CM | POA: Insufficient documentation

## 2022-02-01 DIAGNOSIS — Z888 Allergy status to other drugs, medicaments and biological substances status: Secondary | ICD-10-CM | POA: Insufficient documentation

## 2022-02-01 DIAGNOSIS — Z85850 Personal history of malignant neoplasm of thyroid: Secondary | ICD-10-CM | POA: Insufficient documentation

## 2022-02-01 DIAGNOSIS — Z881 Allergy status to other antibiotic agents status: Secondary | ICD-10-CM | POA: Insufficient documentation

## 2022-02-01 DIAGNOSIS — R0609 Other forms of dyspnea: Secondary | ICD-10-CM | POA: Insufficient documentation

## 2022-02-01 DIAGNOSIS — Z885 Allergy status to narcotic agent status: Secondary | ICD-10-CM | POA: Insufficient documentation

## 2022-02-01 DIAGNOSIS — G4733 Obstructive sleep apnea (adult) (pediatric): Secondary | ICD-10-CM | POA: Insufficient documentation

## 2022-02-01 DIAGNOSIS — C3412 Malignant neoplasm of upper lobe, left bronchus or lung: Secondary | ICD-10-CM | POA: Insufficient documentation

## 2022-02-01 DIAGNOSIS — I700 Atherosclerosis of aorta: Secondary | ICD-10-CM | POA: Insufficient documentation

## 2022-02-01 DIAGNOSIS — L409 Psoriasis, unspecified: Secondary | ICD-10-CM | POA: Insufficient documentation

## 2022-02-01 LAB — CBC WITH DIFFERENTIAL (CANCER CENTER ONLY)
Abs Immature Granulocytes: 0.01 10*3/uL (ref 0.00–0.07)
Basophils Absolute: 0 10*3/uL (ref 0.0–0.1)
Basophils Relative: 0 %
Eosinophils Absolute: 0.3 10*3/uL (ref 0.0–0.5)
Eosinophils Relative: 4 %
HCT: 38.6 % (ref 36.0–46.0)
Hemoglobin: 11.9 g/dL — ABNORMAL LOW (ref 12.0–15.0)
Immature Granulocytes: 0 %
Lymphocytes Relative: 20 %
Lymphs Abs: 1.2 10*3/uL (ref 0.7–4.0)
MCH: 31.1 pg (ref 26.0–34.0)
MCHC: 30.8 g/dL (ref 30.0–36.0)
MCV: 100.8 fL — ABNORMAL HIGH (ref 80.0–100.0)
Monocytes Absolute: 0.5 10*3/uL (ref 0.1–1.0)
Monocytes Relative: 9 %
Neutro Abs: 4.1 10*3/uL (ref 1.7–7.7)
Neutrophils Relative %: 67 %
Platelet Count: 109 10*3/uL — ABNORMAL LOW (ref 150–400)
RBC: 3.83 MIL/uL — ABNORMAL LOW (ref 3.87–5.11)
RDW: 14.1 % (ref 11.5–15.5)
WBC Count: 6.1 10*3/uL (ref 4.0–10.5)
nRBC: 0 % (ref 0.0–0.2)

## 2022-02-01 LAB — CMP (CANCER CENTER ONLY)
ALT: 18 U/L (ref 0–44)
AST: 28 U/L (ref 15–41)
Albumin: 3.4 g/dL — ABNORMAL LOW (ref 3.5–5.0)
Alkaline Phosphatase: 111 U/L (ref 38–126)
Anion gap: 3 — ABNORMAL LOW (ref 5–15)
BUN: 15 mg/dL (ref 8–23)
CO2: 36 mmol/L — ABNORMAL HIGH (ref 22–32)
Calcium: 9.3 mg/dL (ref 8.9–10.3)
Chloride: 100 mmol/L (ref 98–111)
Creatinine: 0.99 mg/dL (ref 0.44–1.00)
GFR, Estimated: >60 mL/min (ref >60)
Glucose, Bld: 101 mg/dL — ABNORMAL HIGH (ref 70–99)
Potassium: 5 mmol/L (ref 3.5–5.1)
Sodium: 139 mmol/L (ref 135–145)
Total Bilirubin: 0.5 mg/dL (ref 0.3–1.2)
Total Protein: 7.5 g/dL (ref 6.5–8.1)

## 2022-02-01 MED ORDER — SODIUM CHLORIDE (PF) 0.9 % IJ SOLN
INTRAMUSCULAR | Status: AC
  Filled 2022-02-01: qty 50

## 2022-02-01 MED ORDER — IOHEXOL 300 MG/ML  SOLN
75.0000 mL | Freq: Once | INTRAMUSCULAR | Status: AC | PRN
  Administered 2022-02-01: 75 mL via INTRAVENOUS

## 2022-02-02 ENCOUNTER — Ambulatory Visit: Payer: Medicare Other | Admitting: Internal Medicine

## 2022-02-05 ENCOUNTER — Inpatient Hospital Stay: Payer: Medicare Other | Admitting: Internal Medicine

## 2022-02-05 ENCOUNTER — Other Ambulatory Visit: Payer: Self-pay

## 2022-02-05 VITALS — BP 117/57 | HR 89 | Temp 98.1°F | Resp 17 | Wt 198.4 lb

## 2022-02-05 DIAGNOSIS — L409 Psoriasis, unspecified: Secondary | ICD-10-CM | POA: Diagnosis not present

## 2022-02-05 DIAGNOSIS — I700 Atherosclerosis of aorta: Secondary | ICD-10-CM | POA: Diagnosis not present

## 2022-02-05 DIAGNOSIS — C3412 Malignant neoplasm of upper lobe, left bronchus or lung: Secondary | ICD-10-CM

## 2022-02-05 DIAGNOSIS — Z85850 Personal history of malignant neoplasm of thyroid: Secondary | ICD-10-CM | POA: Diagnosis not present

## 2022-02-05 DIAGNOSIS — R5383 Other fatigue: Secondary | ICD-10-CM | POA: Diagnosis not present

## 2022-02-05 DIAGNOSIS — Z888 Allergy status to other drugs, medicaments and biological substances status: Secondary | ICD-10-CM | POA: Diagnosis not present

## 2022-02-05 DIAGNOSIS — Z881 Allergy status to other antibiotic agents status: Secondary | ICD-10-CM | POA: Diagnosis not present

## 2022-02-05 DIAGNOSIS — Z9049 Acquired absence of other specified parts of digestive tract: Secondary | ICD-10-CM | POA: Diagnosis not present

## 2022-02-05 DIAGNOSIS — C3490 Malignant neoplasm of unspecified part of unspecified bronchus or lung: Secondary | ICD-10-CM

## 2022-02-05 DIAGNOSIS — I10 Essential (primary) hypertension: Secondary | ICD-10-CM | POA: Diagnosis not present

## 2022-02-05 DIAGNOSIS — R0609 Other forms of dyspnea: Secondary | ICD-10-CM | POA: Diagnosis not present

## 2022-02-05 DIAGNOSIS — R590 Localized enlarged lymph nodes: Secondary | ICD-10-CM | POA: Diagnosis not present

## 2022-02-05 DIAGNOSIS — G4733 Obstructive sleep apnea (adult) (pediatric): Secondary | ICD-10-CM | POA: Diagnosis not present

## 2022-02-05 DIAGNOSIS — Z923 Personal history of irradiation: Secondary | ICD-10-CM | POA: Diagnosis not present

## 2022-02-05 DIAGNOSIS — E785 Hyperlipidemia, unspecified: Secondary | ICD-10-CM | POA: Diagnosis not present

## 2022-02-05 DIAGNOSIS — J449 Chronic obstructive pulmonary disease, unspecified: Secondary | ICD-10-CM | POA: Diagnosis not present

## 2022-02-05 DIAGNOSIS — Z79899 Other long term (current) drug therapy: Secondary | ICD-10-CM | POA: Diagnosis not present

## 2022-02-05 DIAGNOSIS — Z885 Allergy status to narcotic agent status: Secondary | ICD-10-CM | POA: Diagnosis not present

## 2022-02-05 NOTE — Progress Notes (Signed)
**Note De-Identified [place] [facility]** ?    [place] ?[phone]   [phone] ? ?OFFICE PROGRESS NOTE ? ?Hailey Norlander, Hailey Hernandez ?[place] [id number] ? ?DIAGNOSIS: Stage IIb (T1c, N1, M0) non-small cell lung cancer, squamous cell carcinoma presented with left upper lobe lung nodule in addition to left hilar adenopathy diagnosed in April 2022. ?  ?PRIOR THERAPY: Concurrent chemoradiation with weekly carboplatin for AUC of 2 and paclitaxel 45 Mg/M2 for 6-7 weeks. First dose on 02/21/21. Status post 6 cycles.  Last dose was given on March 27, 2021.  The patient is not a candidate for consolidation immunotherapy because of the extensive psoriasis. ?  ?CURRENT THERAPY: Observation. ? ?INTERVAL HISTORY: ?Hailey Hernandez 66 y.o. female returns to the clinic today for follow-up visit accompanied by her husband.  The patient is feeling fine today with no concerning complaints except for the baseline shortness of breath secondary to COPD and she is currently on home oxygen.  She was also treated recently for sinusitis with a course of amoxicillin.  She has significant improvement in her psoriasis after treatment with Tremfya.  The patient denied having any chest pain, cough or hemoptysis.  She has no nausea, vomiting, diarrhea or constipation.  She has no headache or visual changes.  She has no recent weight loss or night sweats.  She is here today for evaluation with repeat CT scan of the chest for restaging of her disease. ? ? ? ?MEDICAL HISTORY: ?Past Medical History:  ?Diagnosis Date  ? Allergy   ? Anxiety disorder   ? Arthritis   ? bil legs  ? Asthma   ? Cancer [facility]   ? Chronic bronchitis   ? Complication of anesthesia   ? slow to wake up, one time she turned blue in recovery  ? COPD (chronic obstructive pulmonary disease) ([place])   ? Depression   ? Diverticulitis   ? DJD (degenerative joint disease)   ? spine  ? Dyspnea   ? O2 at 3 L 24/7  ? Fibromyalgia   ? GERD (gastroesophageal reflux disease)   ?  Glaucoma   ? History of thyroid cancer   ? HTN (hypertension)   ? Hyperlipidemia   ? Impaired glucose tolerance 05/08/2013  ? Lung cancer ([place])   ? OSA (obstructive sleep apnea)   ? CPAP  last sleep study 8-10 yr.ag0  ? Polymyalgia ([place])   ? Pre-diabetes   ? Psoriasis   ? Rhinitis   ? Tobacco abuse   ? ? ?ALLERGIES:  is allergic to doxycycline, tramadol, milnacipran, and apremilast. ? ?MEDICATIONS:  ?Current Outpatient Medications  ?Medication Sig Dispense Refill  ? albuterol (PROVENTIL) (2.5 MG/3ML) 0.083% nebulizer solution Take 3 mLs (2.5 mg total) by nebulization every 4 (four) hours as needed for wheezing or shortness of breath. DX: 496 180 mL 12  ? albuterol (VENTOLIN HFA) 108 (90 Base) MCG/ACT inhaler Inhale 2 puffs every 4-6 hors as needed- rescue 18 g 12  ? alendronate (FOSAMAX) 70 MG tablet Take 1 tablet (70 mg total) by mouth every 7 (seven) days. Take with a full glass of water on an empty stomach. Sunday 12 tablet 3  ? amoxicillin-clavulanate (AUGMENTIN) 875-125 MG tablet Take 1 tablet by mouth 2 (two) times daily. 14 tablet 0  ? aspirin EC 81 MG tablet Take 81 mg by mouth daily.    ? atorvastatin (LIPITOR) 80 MG tablet Take 1 tablet (80 mg total) by mouth daily. 90 tablet 3  ? Budeson-Glycopyrrol-Formoterol ( **Note De-Identified [place] [facility]bfuscation** BREZTRI AEROSPHERE) 160-9-4.8 MCG/ACT AERO Inhale 2 puffs into the lungs 2 (two) times daily. 10.7 g 12  ? calcium carbonate (CALTRATE 600) 1500 (600 Ca) MG TABS tablet Take 1 tablet (1,500 mg total) by mouth 2 (two) times daily with a meal. 60 tablet 3  ? colchicine 0.6 MG tablet Take 1 tablet (0.6 mg total) by mouth daily. 90 tablet 3  ? FLUoxetine (PROZAC) 20 MG capsule Take 1 capsule (20 mg total) by mouth daily. 90 capsule 3  ? fluticasone (FLONASE) 50 MCG/ACT nasal spray Place 2 sprays into both nostrils daily. 16 g 6  ? lisinopril (ZESTRIL) 20 MG tablet Take 1 tablet (20 mg total) by mouth daily. 90 tablet 3  ? montelukast (SINGULAIR) 10 MG tablet TAKE 1 TABLET EVERY DAY 90 tablet 1  ?  Nebulizers (COMPRESSOR/NEBULIZER) MISC Use up to 4 times daily when needed 1 each 0  ? Respiratory Therapy Supplies (FLUTTER) DEVI Patient to use device for ten minutes, three times daily 1 each 0  ? TREMFYA 100 MG/ML SOPN Inject into the skin.    ? triamcinolone ointment (KENALOG) 0.1 % Apply topically 3 (three) times daily.    ? ?No current facility-administered medications for this visit.  ? ? ?SURGICAL HISTORY:  ?Past Surgical History:  ?Procedure Laterality Date  ? APPENDECTOMY    ? BRONCHIAL BIOPSY  01/24/2021  ? Procedure: BRONCHIAL BIOPSIES;  Surgeon: Garner Nash, Hailey Hernandez;  Location: [facility];  Service: Pulmonary;;  ? BRONCHIAL BRUSHINGS  01/24/2021  ? Procedure: BRONCHIAL BRUSHINGS;  Surgeon: Garner Nash, Hailey Hernandez;  Location: [facility];  Service: Pulmonary;;  ? BRONCHIAL NEEDLE ASPIRATION BIOPSY  01/24/2021  ? Procedure: BRONCHIAL NEEDLE ASPIRATION BIOPSIES;  Surgeon: Garner Nash, Hailey Hernandez;  Location: [facility];  Service: Pulmonary;;  ? CARPAL TUNNEL RELEASE  10/30/2011  ? Procedure: CARPAL TUNNEL RELEASE;  Surgeon: Wynonia Sours, MD;  Location: [facility];  Service: Orthopedics;  Laterality: Right;  and mass excision  ? CHOLECYSTECTOMY    ? CHOLESTEATOMA EXCISION    ? right ear  ? left shoulder spurs    ? ORIF right lower leg    ? partial throidectomy    ? TOTAL ABDOMINAL HYSTERECTOMY    ? VIDEO BRONCHOSCOPY WITH ENDOBRONCHIAL NAVIGATION N/A 01/24/2021  ? Procedure: VIDEO BRONCHOSCOPY WITH ENDOBRONCHIAL NAVIGATION;  Surgeon: Garner Nash, Hailey Hernandez;  Location: [facility];  Service: Pulmonary;  Laterality: N/A;  ? VIDEO BRONCHOSCOPY WITH ENDOBRONCHIAL ULTRASOUND N/A 01/24/2021  ? Procedure: VIDEO BRONCHOSCOPY WITH ENDOBRONCHIAL ULTRASOUND;  Surgeon: Garner Nash, Hailey Hernandez;  Location: [facility];  Service: Pulmonary;  Laterality: N/A;  ? ? ?REVIEW OF SYSTEMS:  A comprehensive review of systems was negative except for: Constitutional: positive for fatigue ?Respiratory: positive for dyspnea on  exertion  ? ?PHYSICAL EXAMINATION: General appearance: alert, cooperative, fatigued, and no distress ?Head: Normocephalic, without obvious abnormality, atraumatic ?Neck: no adenopathy, no JVD, supple, symmetrical, trachea midline, and thyroid not enlarged, symmetric, no tenderness/mass/nodules ?Lymph nodes: Cervical, supraclavicular, and axillary nodes normal. ?Resp: clear to auscultation bilaterally ?Back: negative, symmetric, no curvature. ROM normal. No CVA tenderness. ?Cardio: regular rate and rhythm, S1, S2 normal, no murmur, click, rub or gallop ?GI: soft, non-tender; bowel sounds normal; no masses,  no organomegaly ?Extremities: extremities normal, atraumatic, no cyanosis or edemaand mild tenderness to palpation on the left calf. ? ?ECOG PERFORMANCE STATUS: 1 - Symptomatic but completely ambulatory ? ?Blood pressure (!) 117/57, pulse 89, temperature 98.1 ?F (36.7 ?C), temperature source Tympanic, resp. rate **Note De-Identified [place] Obfuscation** 17, weight 198 lb 6 oz (90 kg), SpO2 91 %. ? ?LABORATORY DATA: ?Lab Results  ?Component Value Date  ? WBC 6.1 02/01/2022  ? HGB 11.9 (L) 02/01/2022  ? HCT 38.6 02/01/2022  ? MCV 100.8 (H) 02/01/2022  ? PLT 109 (L) 02/01/2022  ? ? ?  Chemistry   ?   ?Component Value Date/Time  ? NA 139 02/01/2022 1533  ? NA 141 10/27/2021 1512  ? NA 142 08/26/2015 1027  ? K 5.0 02/01/2022 1533  ? K 4.3 08/26/2015 1027  ? CL 100 02/01/2022 1533  ? CO2 36 (H) 02/01/2022 1533  ? CO2 34 (H) 08/26/2015 1027  ? BUN 15 02/01/2022 1533  ? BUN 16 10/27/2021 1512  ? BUN 9.6 08/26/2015 1027  ? CREATININE 0.99 02/01/2022 1533  ? CREATININE 0.7 08/26/2015 1027  ?    ?Component Value Date/Time  ? CALCIUM 9.3 02/01/2022 1533  ? CALCIUM 9.5 08/26/2015 1027  ? ALKPHOS 111 02/01/2022 1533  ? ALKPHOS 131 08/26/2015 1027  ? AST 28 02/01/2022 1533  ? AST 15 08/26/2015 1027  ? ALT 18 02/01/2022 1533  ? ALT 15 08/26/2015 1027  ? BILITOT 0.5 02/01/2022 1533  ? BILITOT 0.66 08/26/2015 1027  ?  ? ? ? ?RADIOGRAPHIC STUDIES: ?CT Chest W  Contrast ? ?Result Date: 02/03/2022 ?CLINICAL DATA:  66 year old female with history of non-small cell lung cancer. Staging examination. Productive cough. * Tracking Code: BO * EXAM: CT CHEST WITH CONTRAST TECHNIQUE: Multidetect

## 2022-02-08 DIAGNOSIS — T85511A Breakdown (mechanical) of esophageal anti-reflux device, initial encounter: Secondary | ICD-10-CM | POA: Diagnosis not present

## 2022-02-08 DIAGNOSIS — G4733 Obstructive sleep apnea (adult) (pediatric): Secondary | ICD-10-CM | POA: Diagnosis not present

## 2022-02-08 DIAGNOSIS — J449 Chronic obstructive pulmonary disease, unspecified: Secondary | ICD-10-CM | POA: Diagnosis not present

## 2022-02-08 DIAGNOSIS — L400 Psoriasis vulgaris: Secondary | ICD-10-CM | POA: Diagnosis not present

## 2022-03-08 DIAGNOSIS — G4733 Obstructive sleep apnea (adult) (pediatric): Secondary | ICD-10-CM | POA: Diagnosis not present

## 2022-03-11 DIAGNOSIS — G4733 Obstructive sleep apnea (adult) (pediatric): Secondary | ICD-10-CM | POA: Diagnosis not present

## 2022-03-11 DIAGNOSIS — T85511A Breakdown (mechanical) of esophageal anti-reflux device, initial encounter: Secondary | ICD-10-CM | POA: Diagnosis not present

## 2022-03-11 DIAGNOSIS — J449 Chronic obstructive pulmonary disease, unspecified: Secondary | ICD-10-CM | POA: Diagnosis not present

## 2022-03-11 DIAGNOSIS — L400 Psoriasis vulgaris: Secondary | ICD-10-CM | POA: Diagnosis not present

## 2022-03-19 ENCOUNTER — Other Ambulatory Visit: Payer: Self-pay | Admitting: Family Medicine

## 2022-03-19 DIAGNOSIS — J301 Allergic rhinitis due to pollen: Secondary | ICD-10-CM

## 2022-04-10 DIAGNOSIS — J449 Chronic obstructive pulmonary disease, unspecified: Secondary | ICD-10-CM | POA: Diagnosis not present

## 2022-04-10 DIAGNOSIS — G4733 Obstructive sleep apnea (adult) (pediatric): Secondary | ICD-10-CM | POA: Diagnosis not present

## 2022-04-10 DIAGNOSIS — L400 Psoriasis vulgaris: Secondary | ICD-10-CM | POA: Diagnosis not present

## 2022-04-10 DIAGNOSIS — T85511A Breakdown (mechanical) of esophageal anti-reflux device, initial encounter: Secondary | ICD-10-CM | POA: Diagnosis not present

## 2022-04-27 ENCOUNTER — Other Ambulatory Visit: Payer: Self-pay | Admitting: Family Medicine

## 2022-04-27 DIAGNOSIS — J9611 Chronic respiratory failure with hypoxia: Secondary | ICD-10-CM

## 2022-04-27 DIAGNOSIS — J410 Simple chronic bronchitis: Secondary | ICD-10-CM

## 2022-05-03 ENCOUNTER — Ambulatory Visit: Payer: Medicare Other | Admitting: Family

## 2022-05-11 DIAGNOSIS — G4733 Obstructive sleep apnea (adult) (pediatric): Secondary | ICD-10-CM | POA: Diagnosis not present

## 2022-05-11 DIAGNOSIS — T85511A Breakdown (mechanical) of esophageal anti-reflux device, initial encounter: Secondary | ICD-10-CM | POA: Diagnosis not present

## 2022-05-11 DIAGNOSIS — J449 Chronic obstructive pulmonary disease, unspecified: Secondary | ICD-10-CM | POA: Diagnosis not present

## 2022-05-11 DIAGNOSIS — L400 Psoriasis vulgaris: Secondary | ICD-10-CM | POA: Diagnosis not present

## 2022-05-24 ENCOUNTER — Ambulatory Visit (INDEPENDENT_AMBULATORY_CARE_PROVIDER_SITE_OTHER): Payer: Medicare Other | Admitting: Family Medicine

## 2022-05-24 ENCOUNTER — Encounter: Payer: Self-pay | Admitting: Family Medicine

## 2022-05-24 ENCOUNTER — Telehealth: Payer: Self-pay | Admitting: Internal Medicine

## 2022-05-24 VITALS — BP 135/69 | HR 96 | Temp 98.4°F | Ht 62.0 in | Wt 191.5 lb

## 2022-05-24 DIAGNOSIS — J988 Other specified respiratory disorders: Secondary | ICD-10-CM

## 2022-05-24 DIAGNOSIS — J9611 Chronic respiratory failure with hypoxia: Secondary | ICD-10-CM

## 2022-05-24 MED ORDER — DEXAMETHASONE SODIUM PHOSPHATE 4 MG/ML IJ SOLN: 4.0000 mg | Freq: Once | INTRAMUSCULAR | Status: DC

## 2022-05-24 MED ORDER — DEXAMETHASONE SODIUM PHOSPHATE 4 MG/ML IJ SOLN
8.0000 mg | Freq: Once | INTRAMUSCULAR | Status: AC
  Administered 2022-05-24: 8 mg

## 2022-05-24 MED ORDER — AZITHROMYCIN 250 MG PO TABS: ORAL_TABLET | ORAL | 0 refills | Status: DC

## 2022-05-24 MED ORDER — AMOXICILLIN-POT CLAVULANATE 875-125 MG PO TABS: 1.0000 | ORAL_TABLET | Freq: Two times a day (BID) | ORAL | 0 refills | Status: AC

## 2022-05-24 MED ORDER — PREDNISONE 20 MG PO TABS: ORAL_TABLET | ORAL | 0 refills | Status: DC

## 2022-05-24 NOTE — Telephone Encounter (Signed)
**Note De-Identified [place] [facility]bfuscation** Called patient regarding rescheduled 09/07 appointment, left a voicemail.

## 2022-05-24 NOTE — Progress Notes (Signed)
**Note De-Identified [place] Obfuscation** Acute Office Visit  Subjective:     Patient ID: Hailey Hernandez, female    DOB: Jun 03, 1956, 66 y.o.   MRN: [id number]  Chief Complaint  Patient presents with   URI    URI  This is a new problem. Episode onset: 2 weeks. The problem has been gradually worsening. There has been no fever. Associated symptoms include congestion, coughing, diarrhea, headaches, nausea, sinus pain, sneezing, a sore throat and wheezing. Pertinent negatives include no chest pain, ear pain or vomiting. Associated symptoms comments: Increase shortness of breath. Cough is productive of small amount of yellow phlegm. She has tried inhaler use and decongestant (increased oxygygen, cough syrup) for the symptoms. The treatment provided mild relief.  She has had to increase her home 02 to 3L to get her oxygen sats above 90%. She has a history of COPD, lung cancer, and pneumonia.   Review of Systems  Constitutional:  Negative for chills, diaphoresis and fever.  HENT:  Positive for congestion, sinus pain, sneezing and sore throat. Negative for ear pain.   Respiratory:  Positive for cough, shortness of breath and wheezing.   Cardiovascular:  Negative for chest pain and leg swelling.  Gastrointestinal:  Positive for diarrhea and nausea. Negative for vomiting.  Neurological:  Positive for headaches.        Objective:    BP 135/69   Pulse 96   Temp 98.4 F (36.9 C) (Temporal)   Ht 5\' 2"  (1.575 m)   Wt 191 lb 8 oz (86.9 kg)   SpO2 95% Comment: on 4 liters oxygen [place]   BMI 35.03 kg/m    Physical Exam Vitals and nursing note reviewed.  Constitutional:      General: She is not in acute distress.    Appearance: She is ill-appearing. She is not toxic-appearing or diaphoretic.  HENT:     Head: Normocephalic and atraumatic.     Right Ear: Tympanic membrane, ear canal and external ear normal.     Left Ear: Tympanic membrane and external ear normal.     Nose: Congestion present.     Mouth/Throat:     Mouth:  Mucous membranes are moist.     Pharynx: Oropharynx is clear. Posterior oropharyngeal erythema present. No oropharyngeal exudate.  Eyes:     General:        Right eye: No discharge.        Left eye: No discharge.     Pupils: Pupils are equal, round, and reactive to light.  Cardiovascular:     Rate and Rhythm: Normal rate and regular rhythm.     Heart sounds: Normal heart sounds. No murmur heard. Pulmonary:     Effort: No respiratory distress.     Breath sounds: No decreased air movement. Examination of the right-lower field reveals decreased breath sounds, wheezing and rhonchi. Examination of the left-lower field reveals decreased breath sounds, wheezing and rhonchi. Decreased breath sounds, wheezing and rhonchi present. No rales.  Abdominal:     General: Bowel sounds are normal. There is no distension.     Palpations: Abdomen is soft.     Tenderness: There is no abdominal tenderness. There is no guarding or rebound.  Skin:    General: Skin is warm and dry.  Neurological:     Mental Status: She is alert and oriented to person, place, and time. Mental status is at baseline.  Psychiatric:        Mood and Affect: Mood normal. **Note De-Identified [place] Obfuscation** Behavior: Behavior normal.     No results found for any visits on 05/24/22.      Assessment & Plan:   Zaiya was seen today for uri.  Diagnoses and all orders for this visit:  Respiratory infection Chronic respiratory failure with hypoxia (HCC) Symptoms x 2 weeks, gradually worsening. Xray is not available today. Rhonchi and wheezing on exam. Oxygen saturation is 95% with 3L of 02. Will treat empirically with augmentin and zpak for possible CAP. Decadron 8 mg given orally today during visit. Start prednisone burst tomorrow. Use nebulizer, continue breztri. Mucinex for cough/congestion. Discussed to seek emergency care if shortness of breath worsens, pulse ox <90, or chest pain. Return to office for new or worsening symptoms, or if symptoms persist.   -     amoxicillin-clavulanate (AUGMENTIN) 875-125 MG tablet; Take 1 tablet by mouth 2 (two) times daily for 7 days. -     azithromycin (ZITHROMAX Z-PAK) 250 MG tablet; As directed -     predniSONE (DELTASONE) 20 MG tablet; 2 po at sametime daily for 5 days- start tomorrow -     dexamethasone (DECADRON) injection 8 mg- give orally   The patient indicates understanding of these issues and agrees with the plan.  Gwenlyn Perking, FNP

## 2022-05-31 ENCOUNTER — Other Ambulatory Visit: Payer: Self-pay

## 2022-06-05 ENCOUNTER — Inpatient Hospital Stay: Payer: Medicare Other | Attending: Internal Medicine

## 2022-06-05 ENCOUNTER — Ambulatory Visit (HOSPITAL_COMMUNITY)
Admission: RE | Admit: 2022-06-05 | Discharge: 2022-06-05 | Disposition: A | Discharge status: Home / Self Care | Payer: Medicare Other | Source: Ambulatory Visit | Attending: Internal Medicine | Admitting: Internal Medicine

## 2022-06-05 ENCOUNTER — Other Ambulatory Visit: Payer: Self-pay

## 2022-06-05 DIAGNOSIS — Z85850 Personal history of malignant neoplasm of thyroid: Secondary | ICD-10-CM | POA: Insufficient documentation

## 2022-06-05 DIAGNOSIS — Z9221 Personal history of antineoplastic chemotherapy: Secondary | ICD-10-CM | POA: Insufficient documentation

## 2022-06-05 DIAGNOSIS — C3490 Malignant neoplasm of unspecified part of unspecified bronchus or lung: Secondary | ICD-10-CM | POA: Diagnosis not present

## 2022-06-05 DIAGNOSIS — Z8572 Personal history of non-Hodgkin lymphomas: Secondary | ICD-10-CM | POA: Insufficient documentation

## 2022-06-05 DIAGNOSIS — R911 Solitary pulmonary nodule: Secondary | ICD-10-CM | POA: Diagnosis not present

## 2022-06-05 DIAGNOSIS — J439 Emphysema, unspecified: Secondary | ICD-10-CM | POA: Diagnosis not present

## 2022-06-05 DIAGNOSIS — D6959 Other secondary thrombocytopenia: Secondary | ICD-10-CM | POA: Insufficient documentation

## 2022-06-05 DIAGNOSIS — I700 Atherosclerosis of aorta: Secondary | ICD-10-CM | POA: Diagnosis not present

## 2022-06-05 DIAGNOSIS — Z923 Personal history of irradiation: Secondary | ICD-10-CM | POA: Insufficient documentation

## 2022-06-05 LAB — CMP (CANCER CENTER ONLY)
ALT: 25 U/L (ref 0–44)
AST: 32 U/L (ref 15–41)
Albumin: 3.5 g/dL (ref 3.5–5.0)
Alkaline Phosphatase: 133 U/L — ABNORMAL HIGH (ref 38–126)
Anion gap: 1 — ABNORMAL LOW (ref 5–15)
BUN: 17 mg/dL (ref 8–23)
CO2: 40 mmol/L — ABNORMAL HIGH (ref 22–32)
Calcium: 9.1 mg/dL (ref 8.9–10.3)
Chloride: 100 mmol/L (ref 98–111)
Creatinine: 0.98 mg/dL (ref 0.44–1.00)
GFR, Estimated: >60 mL/min (ref >60)
Glucose, Bld: 108 mg/dL — ABNORMAL HIGH (ref 70–99)
Potassium: 4.6 mmol/L (ref 3.5–5.1)
Sodium: 141 mmol/L (ref 135–145)
Total Bilirubin: 0.6 mg/dL (ref 0.3–1.2)
Total Protein: 7 g/dL (ref 6.5–8.1)

## 2022-06-05 LAB — CBC WITH DIFFERENTIAL (CANCER CENTER ONLY)
Abs Immature Granulocytes: 0.02 10*3/uL (ref 0.00–0.07)
Basophils Absolute: 0 10*3/uL (ref 0.0–0.1)
Basophils Relative: 0 %
Eosinophils Absolute: 0.2 10*3/uL (ref 0.0–0.5)
Eosinophils Relative: 3 %
HCT: 36.1 % (ref 36.0–46.0)
Hemoglobin: 11.4 g/dL — ABNORMAL LOW (ref 12.0–15.0)
Immature Granulocytes: 0 %
Lymphocytes Relative: 16 %
Lymphs Abs: 1 10*3/uL (ref 0.7–4.0)
MCH: 32 pg (ref 26.0–34.0)
MCHC: 31.6 g/dL (ref 30.0–36.0)
MCV: 101.4 fL — ABNORMAL HIGH (ref 80.0–100.0)
Monocytes Absolute: 0.6 10*3/uL (ref 0.1–1.0)
Monocytes Relative: 9 %
Neutro Abs: 4.5 10*3/uL (ref 1.7–7.7)
Neutrophils Relative %: 72 %
Platelet Count: 78 10*3/uL — ABNORMAL LOW (ref 150–400)
RBC: 3.56 MIL/uL — ABNORMAL LOW (ref 3.87–5.11)
RDW: 14.6 % (ref 11.5–15.5)
WBC Count: 6.3 10*3/uL (ref 4.0–10.5)
nRBC: 0 % (ref 0.0–0.2)

## 2022-06-05 MED ORDER — SODIUM CHLORIDE (PF) 0.9 % IJ SOLN
INTRAMUSCULAR | Status: AC
  Filled 2022-06-05: qty 50

## 2022-06-05 MED ORDER — IOHEXOL 300 MG/ML  SOLN
100.0000 mL | Freq: Once | INTRAMUSCULAR | Status: AC | PRN
  Administered 2022-06-05: 100 mL via INTRAVENOUS

## 2022-06-06 ENCOUNTER — Ambulatory Visit (INDEPENDENT_AMBULATORY_CARE_PROVIDER_SITE_OTHER): Payer: Medicare Other | Admitting: Family Medicine

## 2022-06-06 ENCOUNTER — Ambulatory Visit: Payer: Medicare Other

## 2022-06-06 ENCOUNTER — Encounter: Payer: Self-pay | Admitting: Family Medicine

## 2022-06-06 VITALS — BP 108/52 | HR 94 | Temp 98.0°F | Resp 20 | Ht 62.0 in | Wt 195.0 lb

## 2022-06-06 DIAGNOSIS — J449 Chronic obstructive pulmonary disease, unspecified: Secondary | ICD-10-CM

## 2022-06-06 DIAGNOSIS — J988 Other specified respiratory disorders: Secondary | ICD-10-CM | POA: Diagnosis not present

## 2022-06-06 DIAGNOSIS — J0100 Acute maxillary sinusitis, unspecified: Secondary | ICD-10-CM

## 2022-06-06 DIAGNOSIS — J9611 Chronic respiratory failure with hypoxia: Secondary | ICD-10-CM

## 2022-06-06 MED ORDER — ALBUTEROL SULFATE (2.5 MG/3ML) 0.083% IN NEBU: 2.5000 mg | INHALATION_SOLUTION | RESPIRATORY_TRACT | 12 refills | Status: AC | PRN

## 2022-06-06 MED ORDER — CEFDINIR 300 MG PO CAPS: 300.0000 mg | ORAL_CAPSULE | Freq: Two times a day (BID) | ORAL | 0 refills | Status: DC

## 2022-06-06 NOTE — Progress Notes (Signed)
**Note De-Identified [place] [facility]bfuscation** Subjective: CC: f/u pulmonary infection PCP: [name], DO HQI:[id number] C Oliger is a 66 y.o. female presenting to clinic today for:  1.  Respiratory infection During her last visit I treated her for acute sinusitis.  She returned a couple of weeks ago with a respiratory infection and was treated with a Z-Pak and prednisone for suspected COPD exacerbation.  Patient reports that she continues to have some tightness in her chest and copious sinus drainage.  Her sputum is yellow/white when she coughs.  She has been using her albuterol inhaler twice daily as well as her Breztri twice daily as prescribed.  She had a CT scan done yesterday but does not know the results yet.  She should be seeing her hematologist/oncologist in the next couple of days to review studies and lab results.  She has appoint with pulmonology at the end of the month.  She is compliant with her allergy medicines and nasal spray but notes that she discontinued the corticosteroid nasal spray due to nasal bleeds.  Currently only using saline nasal spray   ROS: Per HPI  Allergies  Allergen Reactions   Doxycycline Shortness Of Breath and Other (See Comments)    Drug interaction with Soriatane   Tramadol Shortness Of Breath    Did not work   Milnacipran     REACTION: dizzy   Apremilast Rash    Rash, able to take this currently   Past Medical History:  Diagnosis Date   Allergy    Anxiety disorder    Arthritis    bil legs   Asthma    Cancer ([place])    Chronic bronchitis    Complication of anesthesia    slow to wake up, one time she turned blue in recovery   COPD (chronic obstructive pulmonary disease) (HCC)    Depression    Diverticulitis    DJD (degenerative joint disease)    spine   Dyspnea    O2 at 3 L 24/7   Fibromyalgia    GERD (gastroesophageal reflux disease)    Glaucoma    History of thyroid cancer    HTN (hypertension)    Hyperlipidemia    Impaired glucose tolerance 05/08/2013   Lung cancer  (HCC)    OSA (obstructive sleep apnea)    CPAP  last sleep study 8-10 yr.ag0   Polymyalgia ([place])    Pre-diabetes    Psoriasis    Rhinitis    Tobacco abuse     Current Outpatient Medications:    albuterol (PROVENTIL) (2.5 MG/3ML) 0.083% nebulizer solution, Take 3 mLs (2.5 mg total) by nebulization every 4 (four) hours as needed for wheezing or shortness of breath. DX: 496 (Patient not taking: Reported on 05/24/2022), Disp: 180 mL, Rfl: 12   albuterol (VENTOLIN HFA) 108 (90 Base) MCG/ACT inhaler, Inhale 2 puffs every 4-6 hors as needed- rescue, Disp: 18 g, Rfl: 12   alendronate (FOSAMAX) 70 MG tablet, Take 1 tablet (70 mg total) by mouth every 7 (seven) days. Take with a full glass of water on an empty stomach. Sunday, Disp: 12 tablet, Rfl: 3   aspirin EC 81 MG tablet, Take 81 mg by mouth daily., Disp: , Rfl:    atorvastatin (LIPITOR) 80 MG tablet, Take 1 tablet (80 mg total) by mouth daily., Disp: 90 tablet, Rfl: 3   azithromycin (ZITHROMAX Z-PAK) 250 MG tablet, As directed, Disp: 6 tablet, Rfl: 0   Budeson-Glycopyrrol-Formoterol (BREZTRI AEROSPHERE) 160-9-4.8 MCG/ACT AERO, Inhale 2 puffs into the lungs 2 ( **Note De-Identified [place] Obfuscation** two) times daily., Disp: 10.7 g, Rfl: 12   calcium carbonate (CALTRATE 600) 1500 (600 Ca) MG TABS tablet, Take 1 tablet (1,500 mg total) by mouth 2 (two) times daily with a meal. (Patient not taking: Reported on 05/24/2022), Disp: 60 tablet, Rfl: 3   colchicine 0.6 MG tablet, Take 1 tablet (0.6 mg total) by mouth daily., Disp: 90 tablet, Rfl: 3   FLUoxetine (PROZAC) 20 MG capsule, Take 1 capsule (20 mg total) by mouth daily., Disp: 90 capsule, Rfl: 3   fluticasone (FLONASE) 50 MCG/ACT nasal spray, USE 2 SPRAYS IN EACH NOSTRIL ONCE DAILY, Disp: 16 g, Rfl: 5   lisinopril (ZESTRIL) 20 MG tablet, Take 1 tablet (20 mg total) by mouth daily., Disp: 90 tablet, Rfl: 3   montelukast (SINGULAIR) 10 MG tablet, TAKE 1 TABLET EVERY DAY, Disp: 90 tablet, Rfl: 1   Nebulizers (COMPRESSOR/NEBULIZER) MISC, Use  up to 4 times daily when needed, Disp: 1 each, Rfl: 0   predniSONE (DELTASONE) 20 MG tablet, 2 po at sametime daily for 5 days- start tomorrow, Disp: 10 tablet, Rfl: 0   Respiratory Therapy Supplies (FLUTTER) DEVI, Patient to use device for ten minutes, three times daily, Disp: 1 each, Rfl: 0   TREMFYA 100 MG/ML SOPN, Inject into the skin., Disp: , Rfl:    triamcinolone ointment (KENALOG) 0.1 %, Apply topically 3 (three) times daily., Disp: , Rfl:  Social History   Socioeconomic History   Marital status: Married    Spouse name: Not on file   Number of children: 3   Years of education: 9   Highest education level: Not on file  Occupational History   Occupation: disabled    Employer: DISABLED  Tobacco Use   Smoking status: Every Day    Packs/day: 1.00    Years: 45.00    Total pack years: 45.00    Types: Cigarettes   Smokeless tobacco: Never   Tobacco comments:    Smokes 1 pack a day [facility] 12/14/2021  Vaping Use   Vaping Use: Never used  Substance and Sexual Activity   Alcohol use: No   Drug use: No   Sexual activity: Not Currently  Other Topics Concern   Not on file  Social History Narrative   Married w/ children   Daily caffeine use   Social Determinants of Health   Financial Resource Strain: Low Risk  (10/23/2021)   Overall Financial Resource Strain (CARDIA)    Difficulty of Paying Living Expenses: Not hard at all  Food Insecurity: No Food Insecurity (10/23/2021)   Hunger Vital Sign    Worried About Running Out of Food in the Last Year: Never true    Ran Out of Food in the Last Year: Never true  Transportation Needs: No Transportation Needs (10/23/2021)   PRAPARE - Hydrologist (Medical): No    Lack of Transportation (Non-Medical): No  Physical Activity: Inactive (10/23/2021)   Exercise Vital Sign    Days of Exercise per Week: 0 days    Minutes of Exercise per Session: 0 min  Stress: No Stress Concern Present (10/23/2021)   [place]    Feeling of Stress : Only a little  Social Connections: Moderately Integrated (10/23/2021)   Social Connection and Isolation Panel [NHANES]    Frequency of Communication with Friends and Family: More than three times a week    Frequency of Social Gatherings with Friends and Family: More than three times **Note De-Identified [place] Obfuscation** a week    Attends Religious Services: 1 to 4 times per year    Active Member of Clubs or Organizations: No    Attends Archivist Meetings: Never    Marital Status: Married  Human resources officer Violence: Not At Risk (10/23/2021)   Humiliation, Afraid, Rape, and Kick questionnaire    Fear of Current or Ex-Partner: No    Emotionally Abused: No    Physically Abused: No    Sexually Abused: No   Family History  Problem Relation Age of Onset   Heart disease Mother    Cancer Mother    Hyperlipidemia Mother    Hypertension Mother    Heart attack Mother    Heart disease Father    Heart attack Father    Emphysema Sister    Hypertension Sister    Heart attack Sister    Emphysema Sister    Alpha-1 antitrypsin deficiency Sister    Alpha-1 antitrypsin deficiency Sister    Allergies Sister    Asthma Sister    Cancer Sister    Hyperlipidemia Daughter    Hypertension Daughter    Cancer Brother    Hyperlipidemia Brother    Hypertension Brother    Hypertension Son    Tuberculosis Other        grandmother   Breast cancer Neg Hx     Objective: Office vital signs reviewed. BP (!) 108/52   Pulse 94   Temp 98 F (36.7 C) (Temporal)   Resp 20   Ht 5\' 2"  (1.575 m)   Wt 195 lb (88.5 kg)   SpO2 93% Comment: 4 liters  BMI 35.67 kg/m   Physical Examination:  General: Awake, alert, chronically ill-appearing female, No acute distress HEENT: Normal    Neck: No masses palpated. No lymphadenopathy    Ears: Tympanic membranes intact, normal light reflex, no erythema, no bulging    Eyes: PERRLA, extraocular membranes intact,  sclera white    Nose: nasal turbinates moist, yellow, white purulent appearing nasal discharge    Throat: moist mucus membranes  Cardio: regular rate and rhythm, S1S2 heard, no murmurs appreciated Pulm: Globally decreased breath sounds.  Normal work of breathing on supplemental oxygen [place] nasal cannula  CT Chest W Contrast  Result Date: 06/06/2022 CLINICAL DATA:  Non-small cell lung cancer staging; * Tracking Code: BO * EXAM: CT CHEST WITH CONTRAST TECHNIQUE: Multidetector CT imaging of the chest was performed during intravenous contrast administration. RADIATION DOSE REDUCTION: This exam was performed according to the departmental dose-optimization program which includes automated exposure control, adjustment of the mA and/or kV according to patient size and/or use of iterative reconstruction technique. CONTRAST:  129mL OMNIPAQUE IOHEXOL 300 MG/ML  SOLN COMPARISON:  Chest CT dated Feb 01, 2022 FINDINGS: Cardiovascular: Normal heart size. No pericardial effusion. Mitral annular calcifications and mitral valve calcifications. Severe left main and three-vessel coronary artery calcifications. No suspicious filling defects of the central pulmonary arteries. Normal caliber thoracic aorta with atherosclerotic disease. Mediastinum/Nodes: Prior left thyroidectomy. Small hiatal hernia. No pathologically enlarged lymph nodes seen in the chest. Lungs/Pleura: Central airways are patent. Stable linear opacity of the left upper lobe, compatible with postradiation change. Stable size of irregular nodule at the left lung apex measuring 13 x 10 mm on series 7, image 37, unchanged in size when compared with prior exam and remeasured in similar plane. Mild paraseptal emphysema. No new or enlarging pulmonary nodules. Upper Abdomen: Cirrhotic liver morphology and splenomegaly. No acute abnormality. Musculoskeletal: No chest wall abnormality. No **Note De-Identified [place] Obfuscation** acute or significant osseous findings. IMPRESSION: 1. Stable postradiation changes in  treated nodule of the left upper lobe. No evidence of recurrent or metastatic disease. 2. Cirrhotic liver morphology and splenomegaly. 3. Severe left main and three-vessel coronary artery calcifications. 4. Aortic Atherosclerosis (ICD10-I70.0) and Emphysema (ICD10-J43.9). Electronically Signed   By: Yetta Glassman M.D.   On: 06/06/2022 14:01     Assessment/ Plan: 66 y.o. female   Subacute maxillary sinusitis - Plan: cefdinir (OMNICEF) 300 MG capsule  Chronic respiratory failure with hypoxia (HCC) - Plan: DG Chest 2 View, albuterol (PROVENTIL) (2.5 MG/3ML) 0.083% nebulizer solution  Respiratory infection - Plan: DG Chest 2 View, cefdinir (OMNICEF) 300 MG capsule  Chronic obstructive pulmonary disease, unspecified COPD type ([place]) - Plan: albuterol (PROVENTIL) (2.5 MG/3ML) 0.083% nebulizer solution  Omnicef prescribed.  Discussed ways to reduce risk of recurrent sinus infections.  I canceled the chest x-ray that was previously planned as I did not originally know that she had gotten a CAT scan.  Likely we were able to get a radiologist to read this and there is no evidence of ongoing pulmonary infection.  Continue to follow-up with hematology/oncology and pulmonology as directed.  I will CC Dr. Annamaria Boots to see if perhaps he thinks putting her on chronic azithromycin might be beneficial.  Certainly would appreciate his input  I have renewed her albuterol nebulizer solution  No orders of the defined types were placed in this encounter.  No orders of the defined types were placed in this encounter.    [name], DO [place] [phone]

## 2022-06-06 NOTE — Patient Instructions (Signed)
**Note De-Identified [place] Obfuscation** Will reach out to Dr Annamaria Boots about chronic antibiotics for the lungs.

## 2022-06-07 ENCOUNTER — Ambulatory Visit: Payer: Medicare Other | Admitting: Internal Medicine

## 2022-06-07 ENCOUNTER — Other Ambulatory Visit: Payer: Self-pay

## 2022-06-07 ENCOUNTER — Inpatient Hospital Stay: Payer: Medicare Other | Admitting: Internal Medicine

## 2022-06-07 VITALS — BP 124/61 | HR 93 | Temp 98.0°F | Resp 16 | Ht 62.0 in | Wt 196.8 lb

## 2022-06-07 DIAGNOSIS — Z9221 Personal history of antineoplastic chemotherapy: Secondary | ICD-10-CM | POA: Diagnosis not present

## 2022-06-07 DIAGNOSIS — Z923 Personal history of irradiation: Secondary | ICD-10-CM | POA: Diagnosis not present

## 2022-06-07 DIAGNOSIS — Z8572 Personal history of non-Hodgkin lymphomas: Secondary | ICD-10-CM | POA: Diagnosis not present

## 2022-06-07 DIAGNOSIS — C3490 Malignant neoplasm of unspecified part of unspecified bronchus or lung: Secondary | ICD-10-CM

## 2022-06-07 DIAGNOSIS — Z85850 Personal history of malignant neoplasm of thyroid: Secondary | ICD-10-CM | POA: Diagnosis not present

## 2022-06-07 DIAGNOSIS — D6959 Other secondary thrombocytopenia: Secondary | ICD-10-CM | POA: Diagnosis not present

## 2022-06-07 NOTE — Progress Notes (Signed)
**Note De-Identified Marzette Obfuscation** [place] [phone]   [phone]  OFFICE PROGRESS NOTE  [name], DO [place] [address]  DIAGNOSIS:  1) Stage IIb (T1c, N1, M0) non-small cell lung cancer, squamous cell carcinoma presented with left upper lobe lung nodule in addition to left hilar adenopathy diagnosed in April 2022. 2) thrombocytopenia secondary to liver cirrhosis and splenomegaly   PRIOR THERAPY: Concurrent chemoradiation with weekly carboplatin for AUC of 2 and paclitaxel 45 Mg/M2 for 6-7 weeks. First dose on 02/21/21. Status post 6 cycles.  Last dose was given on March 27, 2021.  The patient is not a candidate for consolidation immunotherapy because of the extensive psoriasis.   CURRENT THERAPY: Observation.  INTERVAL HISTORY: Hailey Hernandez 66 y.o. female returns to the clinic today for follow-up visit accompanied by her husband.  The patient is feeling fine today with no concerning complaints except for the baseline shortness of breath and she is currently on home oxygen.  She was treated recently with Omnicef and prednisone for suspicious pneumonia by her primary care physician.  She denied having any fever or chills.  She has no chest pain or hemoptysis.  She has no nausea, vomiting, diarrhea or constipation.  She has no headache or visual changes.  She is here today for evaluation with repeat CT scan of the chest for restaging of her disease.    MEDICAL HISTORY: Past Medical History:  Diagnosis Date   Allergy    Anxiety disorder    Arthritis    bil legs   Asthma    Cancer ([place])    Chronic bronchitis    Complication of anesthesia    slow to wake up, one time she turned blue in recovery   COPD (chronic obstructive pulmonary disease) (HCC)    Depression    Diverticulitis    DJD (degenerative joint disease)    spine   Dyspnea    O2 at 3 L 24/7   Fibromyalgia    GERD (gastroesophageal reflux disease)    Glaucoma    History of thyroid  cancer    HTN (hypertension)    Hyperlipidemia    Impaired glucose tolerance 05/08/2013   Lung cancer (HCC)    OSA (obstructive sleep apnea)    CPAP  last sleep study 8-10 yr.ag0   Polymyalgia ([place])    Pre-diabetes    Psoriasis    Rhinitis    Tobacco abuse     ALLERGIES:  is allergic to doxycycline, tramadol, milnacipran, and apremilast.  MEDICATIONS:  Current Outpatient Medications  Medication Sig Dispense Refill   albuterol (PROVENTIL) (2.5 MG/3ML) 0.083% nebulizer solution Take 3 mLs (2.5 mg total) by nebulization every 4 (four) hours as needed for wheezing or shortness of breath. DX: 496 180 mL 12   albuterol (VENTOLIN HFA) 108 (90 Base) MCG/ACT inhaler Inhale 2 puffs every 4-6 hors as needed- rescue 18 g 12   alendronate (FOSAMAX) 70 MG tablet Take 1 tablet (70 mg total) by mouth every 7 (seven) days. Take with a full glass of water on an empty stomach. Sunday 12 tablet 3   aspirin EC 81 MG tablet Take 81 mg by mouth daily.     atorvastatin (LIPITOR) 80 MG tablet Take 1 tablet (80 mg total) by mouth daily. 90 tablet 3   Budeson-Glycopyrrol-Formoterol (BREZTRI AEROSPHERE) 160-9-4.8 MCG/ACT AERO Inhale 2 puffs into the lungs 2 (two) times daily. 10.7 g 12   calcium carbonate (CALTRATE 600) 1500 ( **Note De-Identified Vowels Obfuscation** 600 Ca) MG TABS tablet Take 1 tablet (1,500 mg total) by mouth 2 (two) times daily with a meal. 60 tablet 3   cefdinir (OMNICEF) 300 MG capsule Take 1 capsule (300 mg total) by mouth 2 (two) times daily. 1 po BID 20 capsule 0   colchicine 0.6 MG tablet Take 1 tablet (0.6 mg total) by mouth daily. 90 tablet 3   FLUoxetine (PROZAC) 20 MG capsule Take 1 capsule (20 mg total) by mouth daily. 90 capsule 3   fluticasone (FLONASE) 50 MCG/ACT nasal spray USE 2 SPRAYS IN EACH NOSTRIL ONCE DAILY 16 g 5   lisinopril (ZESTRIL) 20 MG tablet Take 1 tablet (20 mg total) by mouth daily. 90 tablet 3   montelukast (SINGULAIR) 10 MG tablet TAKE 1 TABLET EVERY DAY 90 tablet 1   Nebulizers  (COMPRESSOR/NEBULIZER) MISC Use up to 4 times daily when needed 1 each 0   Respiratory Therapy Supplies (FLUTTER) DEVI Patient to use device for ten minutes, three times daily 1 each 0   TREMFYA 100 MG/ML SOPN Inject into the skin.     triamcinolone ointment (KENALOG) 0.1 % Apply topically 3 (three) times daily.     No current facility-administered medications for this visit.    SURGICAL HISTORY:  Past Surgical History:  Procedure Laterality Date   APPENDECTOMY     BRONCHIAL BIOPSY  01/24/2021   Procedure: BRONCHIAL BIOPSIES;  Surgeon: Garner Nash, DO;  Location: [facility];  Service: Pulmonary;;   BRONCHIAL BRUSHINGS  01/24/2021   Procedure: BRONCHIAL BRUSHINGS;  Surgeon: Garner Nash, DO;  Location: [facility];  Service: Pulmonary;;   BRONCHIAL NEEDLE ASPIRATION BIOPSY  01/24/2021   Procedure: BRONCHIAL NEEDLE ASPIRATION BIOPSIES;  Surgeon: Garner Nash, DO;  Location: [facility];  Service: Pulmonary;;   CARPAL TUNNEL RELEASE  10/30/2011   Procedure: CARPAL TUNNEL RELEASE;  Surgeon: Wynonia Sours, MD;  Location: [facility];  Service: Orthopedics;  Laterality: Right;  and mass excision   CHOLECYSTECTOMY     CHOLESTEATOMA EXCISION     right ear   left shoulder spurs     ORIF right lower leg     partial throidectomy     TOTAL ABDOMINAL HYSTERECTOMY     VIDEO BRONCHOSCOPY WITH ENDOBRONCHIAL NAVIGATION N/A 01/24/2021   Procedure: VIDEO BRONCHOSCOPY WITH ENDOBRONCHIAL NAVIGATION;  Surgeon: Garner Nash, DO;  Location: [facility];  Service: Pulmonary;  Laterality: N/A;   VIDEO BRONCHOSCOPY WITH ENDOBRONCHIAL ULTRASOUND N/A 01/24/2021   Procedure: VIDEO BRONCHOSCOPY WITH ENDOBRONCHIAL ULTRASOUND;  Surgeon: Garner Nash, DO;  Location: [facility];  Service: Pulmonary;  Laterality: N/A;    REVIEW OF SYSTEMS:  A comprehensive review of systems was negative except for: Constitutional: positive for fatigue Respiratory: positive for cough and dyspnea on  exertion   PHYSICAL EXAMINATION: General appearance: alert, cooperative, fatigued, and no distress Head: Normocephalic, without obvious abnormality, atraumatic Neck: no adenopathy, no JVD, supple, symmetrical, trachea midline, and thyroid not enlarged, symmetric, no tenderness/mass/nodules Lymph nodes: Cervical, supraclavicular, and axillary nodes normal. Resp: clear to auscultation bilaterally Back: negative, symmetric, no curvature. ROM normal. No CVA tenderness. Cardio: regular rate and rhythm, S1, S2 normal, no murmur, click, rub or gallop GI: soft, non-tender; bowel sounds normal; no masses,  no organomegaly Extremities: extremities normal, atraumatic, no cyanosis or edemaand mild tenderness to palpation on the left calf.  ECOG PERFORMANCE STATUS: 1 - Symptomatic but completely ambulatory  Blood pressure 124/61, pulse 93, temperature 98 F (36.7 C), temperature source Oral, **Note De-Identified Robben Obfuscation** resp. rate 16, height 5\' 2"  (1.575 m), weight 196 lb 12.8 oz (89.3 kg), SpO2 95 %.  LABORATORY DATA: Lab Results  Component Value Date   WBC 6.3 06/05/2022   HGB 11.4 (L) 06/05/2022   HCT 36.1 06/05/2022   MCV 101.4 (H) 06/05/2022   PLT 78 (L) 06/05/2022      Chemistry      Component Value Date/Time   NA 141 06/05/2022 1349   NA 141 10/27/2021 1512   NA 142 08/26/2015 1027   K 4.6 06/05/2022 1349   K 4.3 08/26/2015 1027   CL 100 06/05/2022 1349   CO2 40 (H) 06/05/2022 1349   CO2 34 (H) 08/26/2015 1027   BUN 17 06/05/2022 1349   BUN 16 10/27/2021 1512   BUN 9.6 08/26/2015 1027   CREATININE 0.98 06/05/2022 1349   CREATININE 0.7 08/26/2015 1027      Component Value Date/Time   CALCIUM 9.1 06/05/2022 1349   CALCIUM 9.5 08/26/2015 1027   ALKPHOS 133 (H) 06/05/2022 1349   ALKPHOS 131 08/26/2015 1027   AST 32 06/05/2022 1349   AST 15 08/26/2015 1027   ALT 25 06/05/2022 1349   ALT 15 08/26/2015 1027   BILITOT 0.6 06/05/2022 1349   BILITOT 0.66 08/26/2015 1027       RADIOGRAPHIC STUDIES: CT  Chest W Contrast  Result Date: 06/06/2022 CLINICAL DATA:  Non-small cell lung cancer staging; * Tracking Code: BO * EXAM: CT CHEST WITH CONTRAST TECHNIQUE: Multidetector CT imaging of the chest was performed during intravenous contrast administration. RADIATION DOSE REDUCTION: This exam was performed according to the departmental dose-optimization program which includes automated exposure control, adjustment of the mA and/or kV according to patient size and/or use of iterative reconstruction technique. CONTRAST:  171mL OMNIPAQUE IOHEXOL 300 MG/ML  SOLN COMPARISON:  Chest CT dated Feb 01, 2022 FINDINGS: Cardiovascular: Normal heart size. No pericardial effusion. Mitral annular calcifications and mitral valve calcifications. Severe left main and three-vessel coronary artery calcifications. No suspicious filling defects of the central pulmonary arteries. Normal caliber thoracic aorta with atherosclerotic disease. Mediastinum/Nodes: Prior left thyroidectomy. Small hiatal hernia. No pathologically enlarged lymph nodes seen in the chest. Lungs/Pleura: Central airways are patent. Stable linear opacity of the left upper lobe, compatible with postradiation change. Stable size of irregular nodule at the left lung apex measuring 13 x 10 mm on series 7, image 37, unchanged in size when compared with prior exam and remeasured in similar plane. Mild paraseptal emphysema. No new or enlarging pulmonary nodules. Upper Abdomen: Cirrhotic liver morphology and splenomegaly. No acute abnormality. Musculoskeletal: No chest wall abnormality. No acute or significant osseous findings. IMPRESSION: 1. Stable postradiation changes in treated nodule of the left upper lobe. No evidence of recurrent or metastatic disease. 2. Cirrhotic liver morphology and splenomegaly. 3. Severe left main and three-vessel coronary artery calcifications. 4. Aortic Atherosclerosis (ICD10-I70.0) and Emphysema (ICD10-J43.9). Electronically Signed   By: Yetta Glassman M.D.   On: 06/06/2022 14:01    ASSESSMENT AND PLAN: This is a very pleasant 66 years old white female recently diagnosed with unresectable stage IIb (T1c, N1, M0) non-small cell lung cancer, squamous cell carcinoma diagnosed in April 2022 and presented with left upper lobe lung nodule in addition to left hilar adenopathy. The patient completed a course of concurrent chemoradiation with weekly carboplatin for AUC of 2 and paclitaxel 45 Mg/M2 status post 6 cycles.   The patient has been in observation since that time.  The patient did not receive consolidation **Note De-Identified Gutzmer Obfuscation** immunotherapy because of her significant psoriasis. The patient had repeat CT scan of the chest performed recently.  I personally and independently reviewed the scans and discussed the result with the patient today. Her scan showed no concerning findings for disease recurrence or metastasis. I recommended for her to continue on observation with repeat CT scan of the chest in 6 months. For the thrombocytopenia, this is likely secondary to liver cirrhosis and splenomegaly.  We will continue to monitor closely. For the coronary atherosclerosis and asthma, she will follow with her primary care physician.  She may need to see cardiology for further evaluation. The patient was advised to call immediately if she has any other concerning symptoms in the interval. The patient voices understanding of current disease status and treatment options and is in agreement with the current care plan.  All questions were answered. The patient knows to call the clinic with any problems, questions or concerns. We can certainly see the patient much sooner if necessary.  Disclaimer: This note was dictated with voice recognition software. Similar sounding words can inadvertently be transcribed and may not be corrected upon review.

## 2022-06-08 DIAGNOSIS — G4733 Obstructive sleep apnea (adult) (pediatric): Secondary | ICD-10-CM | POA: Diagnosis not present

## 2022-06-11 DIAGNOSIS — L400 Psoriasis vulgaris: Secondary | ICD-10-CM | POA: Diagnosis not present

## 2022-06-11 DIAGNOSIS — J449 Chronic obstructive pulmonary disease, unspecified: Secondary | ICD-10-CM | POA: Diagnosis not present

## 2022-06-11 DIAGNOSIS — G4733 Obstructive sleep apnea (adult) (pediatric): Secondary | ICD-10-CM | POA: Diagnosis not present

## 2022-06-11 DIAGNOSIS — T85511A Breakdown (mechanical) of esophageal anti-reflux device, initial encounter: Secondary | ICD-10-CM | POA: Diagnosis not present

## 2022-06-15 IMAGING — DX DG RIBS 2V*L*
4 series · 4 of 4 positions shown · non-contrast
Comparison: CT chest 11/06/2021, chest radiograph 01/24/2021

CLINICAL DATA: Fall out of bed, rib pain in left axillary region

EXAM:
CHEST - 2 VIEW; LEFT RIBS - 2 VIEW

[rib pa (1 of 2)]
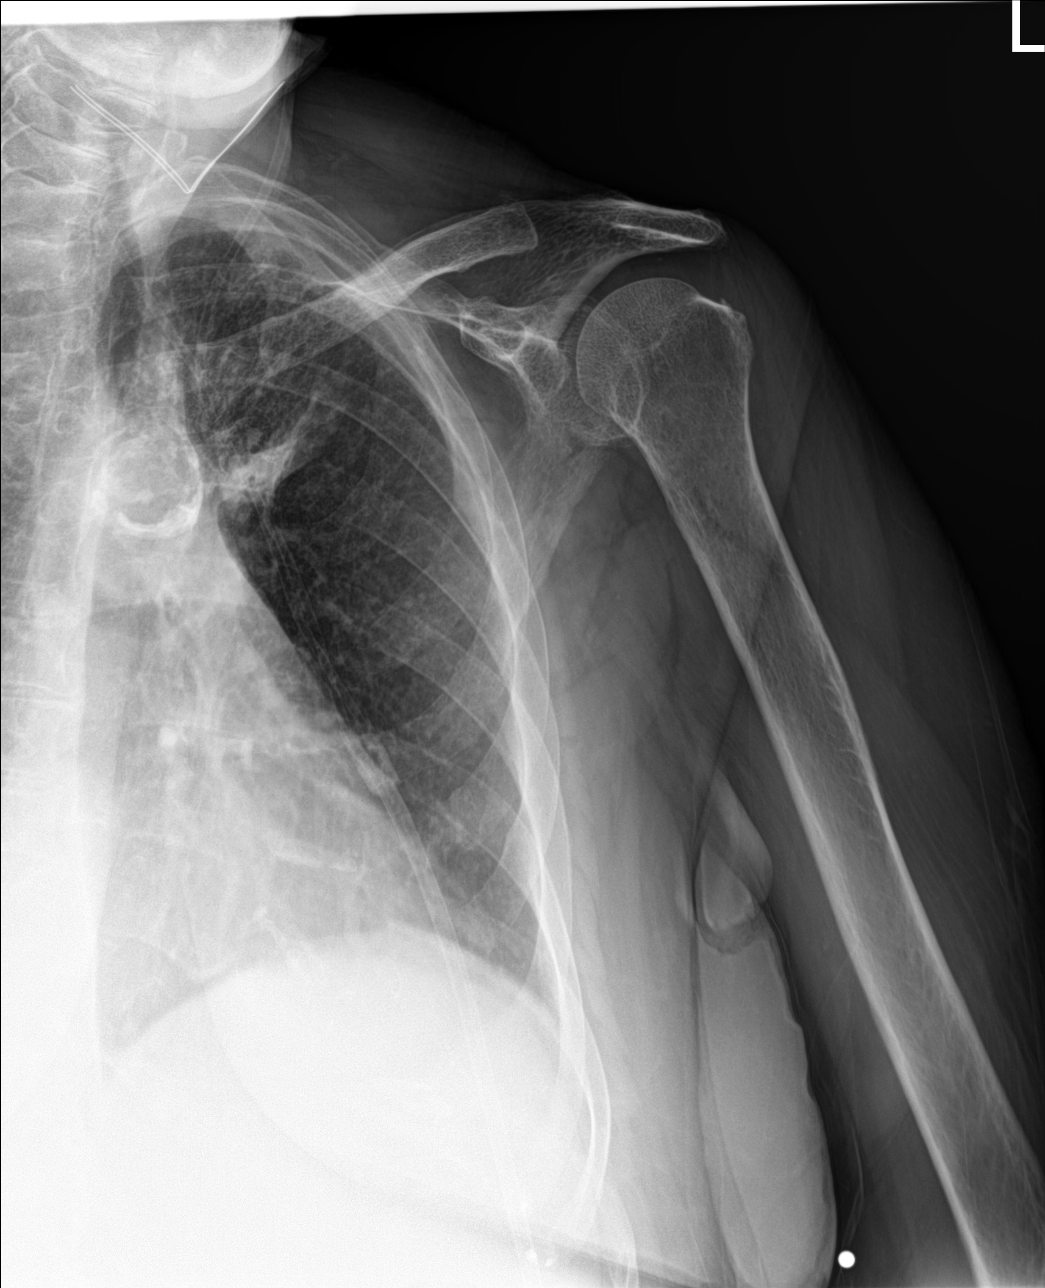

[rib obl (1 of 2)]
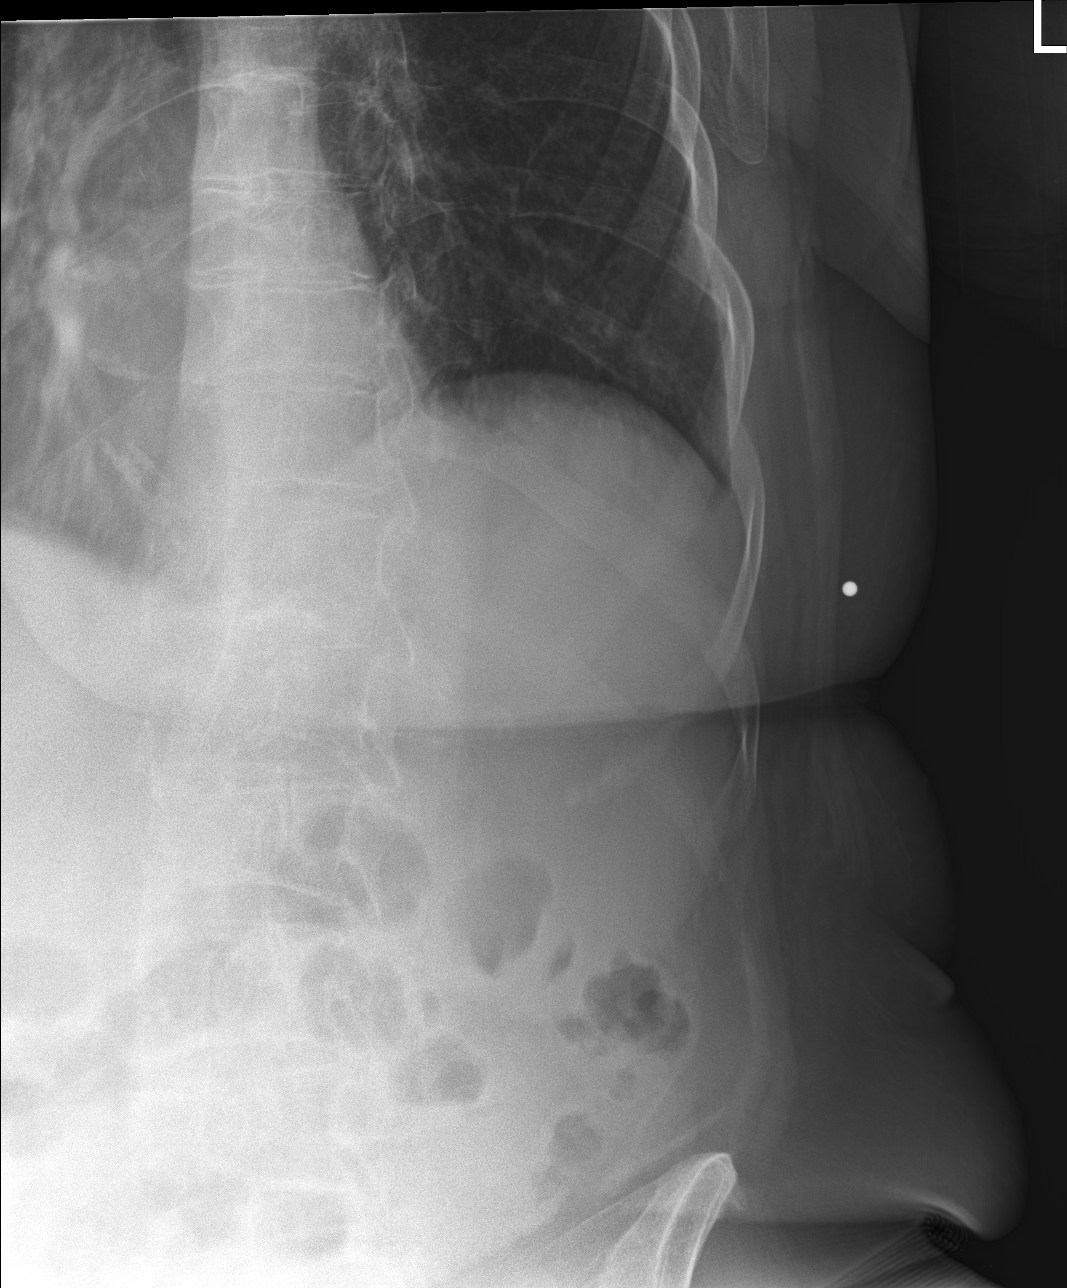

[rib obl (2 of 2)]
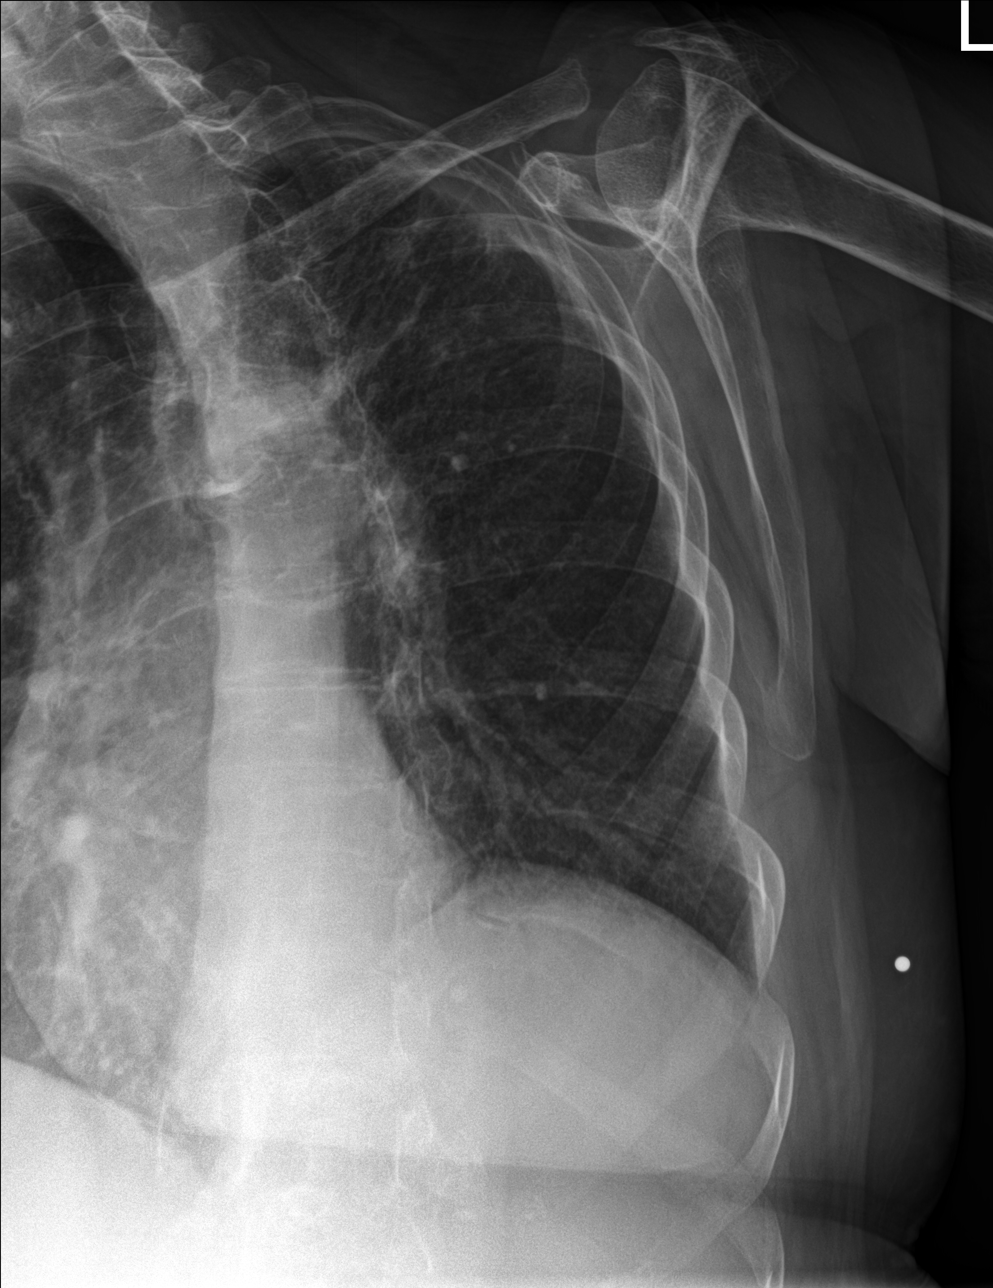

[rib pa (2 of 2)]
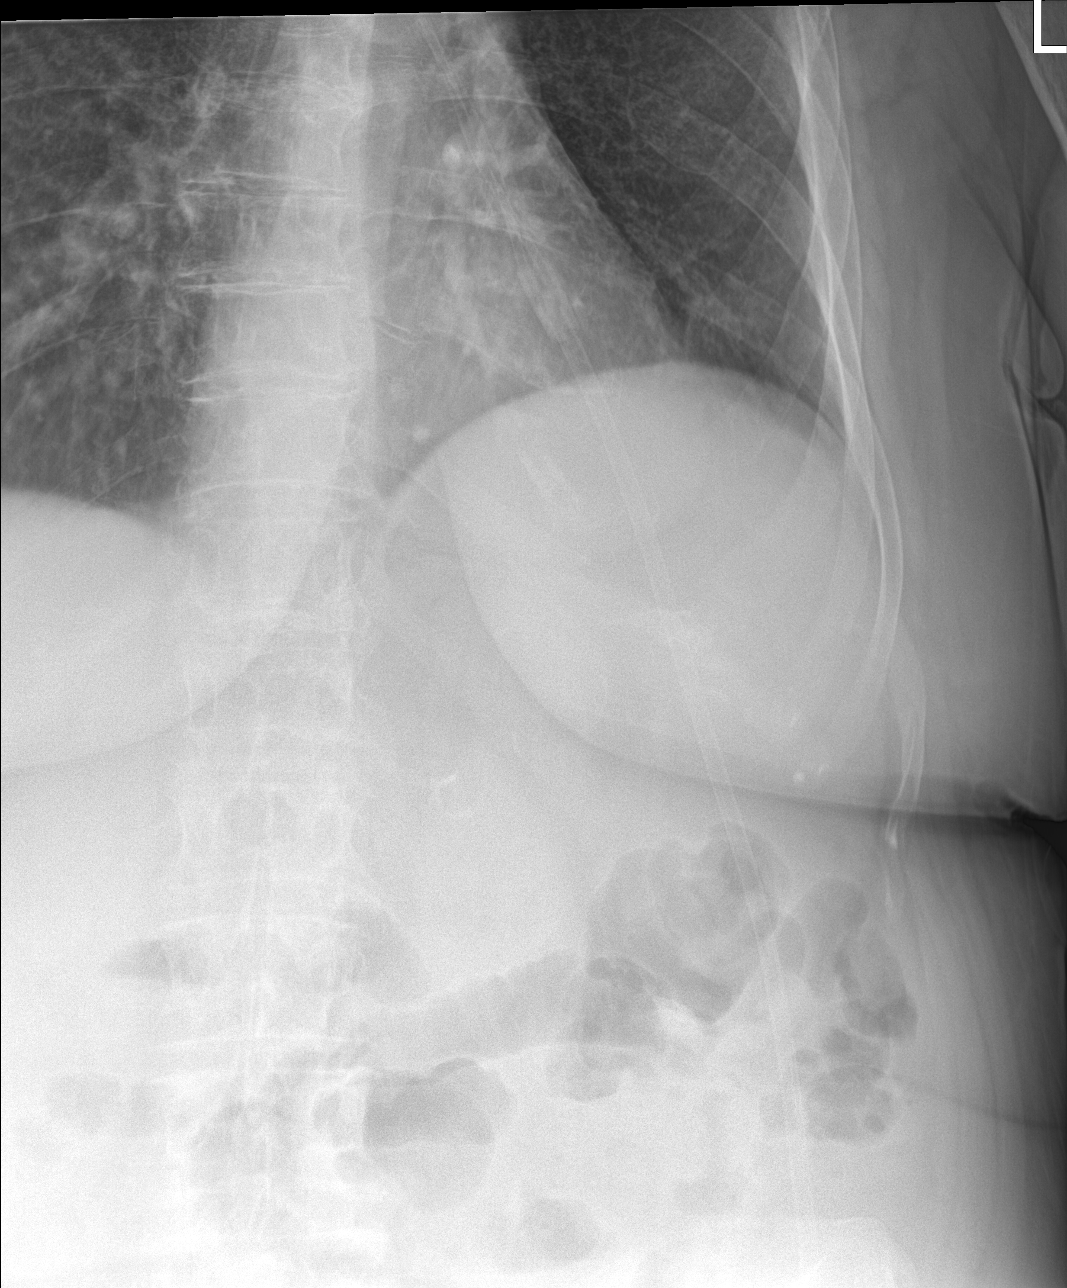

[4 of 4 positions shown; findings below may reference images not displayed]

FINDINGS: Chest: The cardiomediastinal silhouette is within normal limits.
There is dense calcified atherosclerotic plaque of the aortic arch.

Linear opacity in the left upper lobe most likely reflects scarring,
as bandlike opacity is seen in this location on the prior CT. There
is slight asymmetric elevation of the left hemidiaphragm likely
reflecting associated volume loss. Otherwise, there is no focal
consolidation or pulmonary edema. There is no pleural effusion or
pneumothorax.

Ribs: There is no displaced rib fracture or other acute osseous
abnormality identified.
IMPRESSION: 1. No displaced rib fracture or other acute osseous abnormality
identified.
2. Bandlike opacity in the left upper lobe likely reflecting
scarring/fibrosis. No evidence of acute cardiopulmonary process.

## 2022-06-15 IMAGING — DX DG CHEST 2V
2 series · 2 of 2 positions shown · non-contrast
Comparison: CT chest 11/06/2021, chest radiograph 01/24/2021

CLINICAL DATA: Fall out of bed, rib pain in left axillary region

EXAM:
CHEST - 2 VIEW; LEFT RIBS - 2 VIEW

[chest pa]
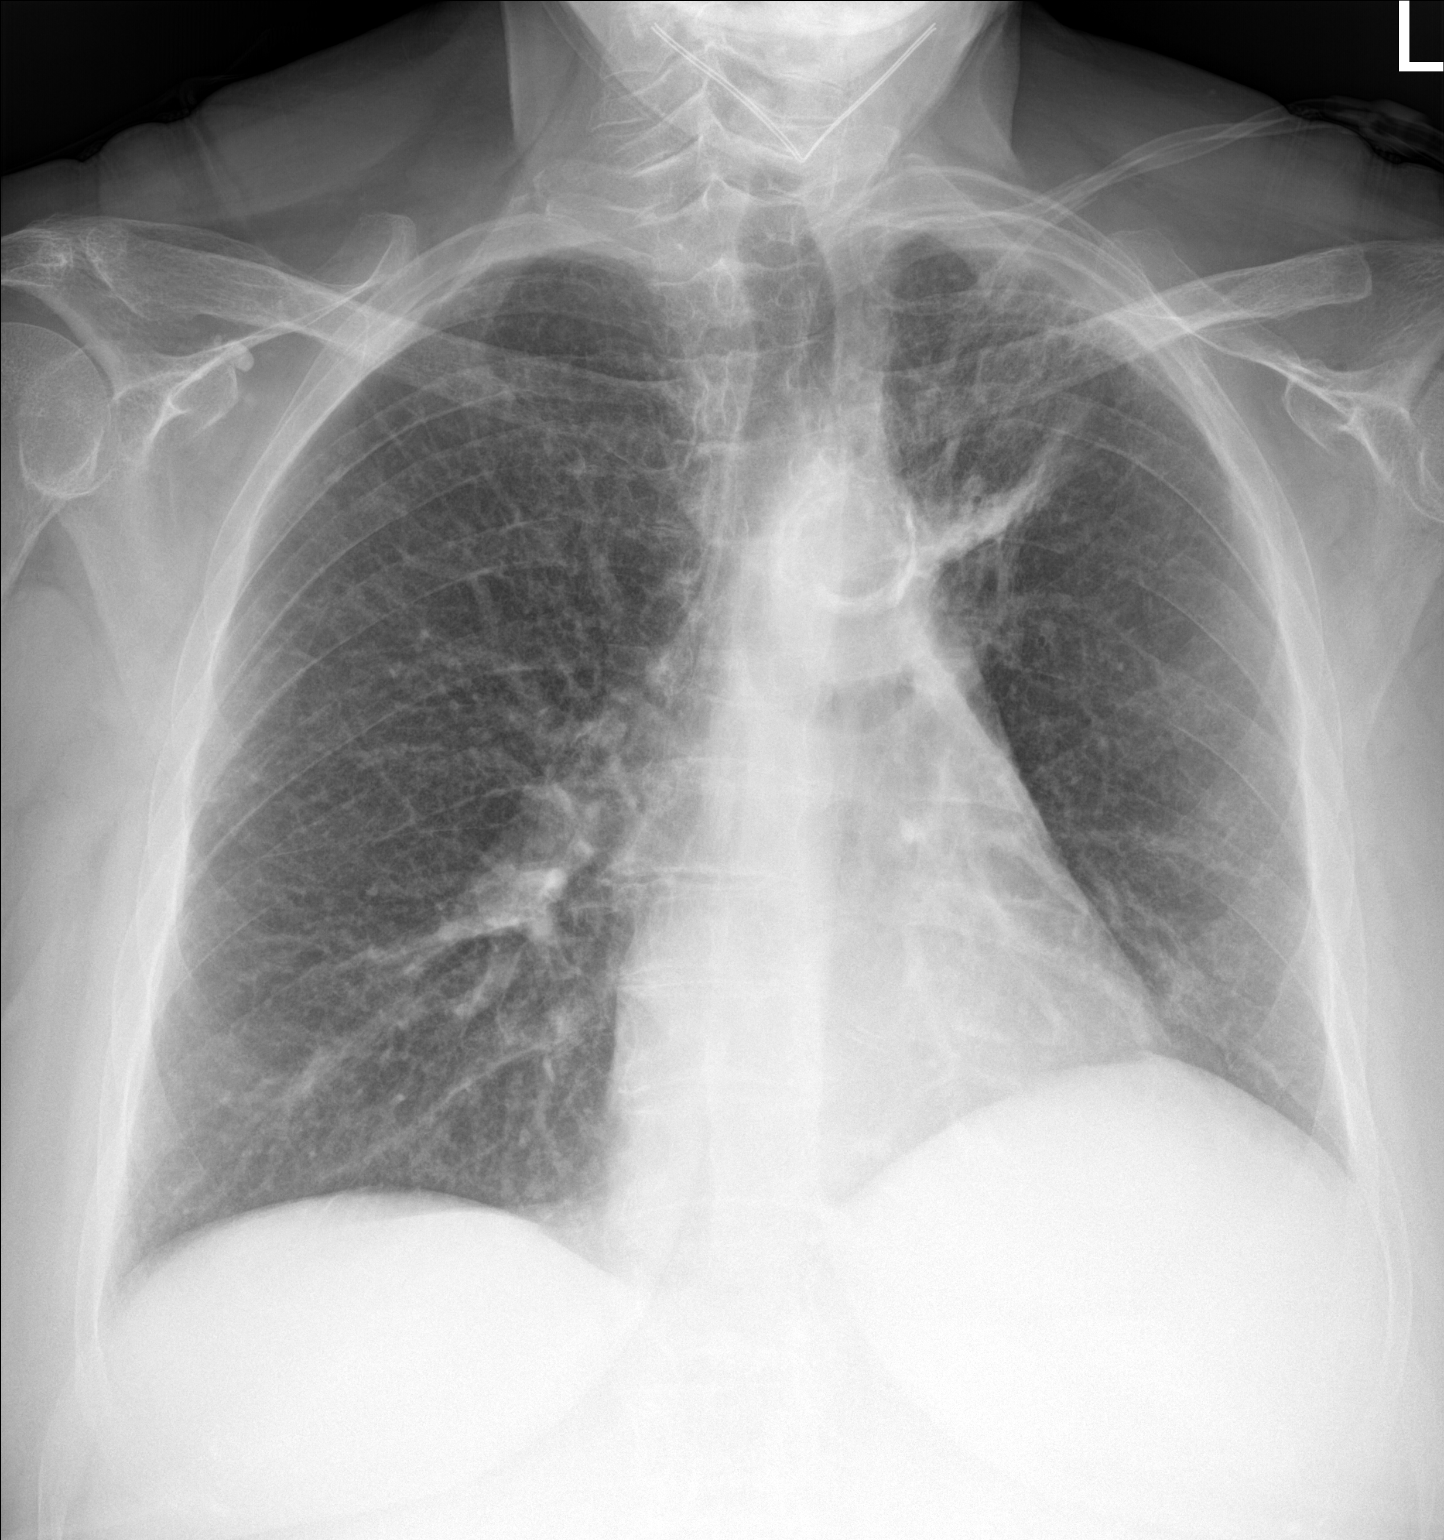

[chest lat]
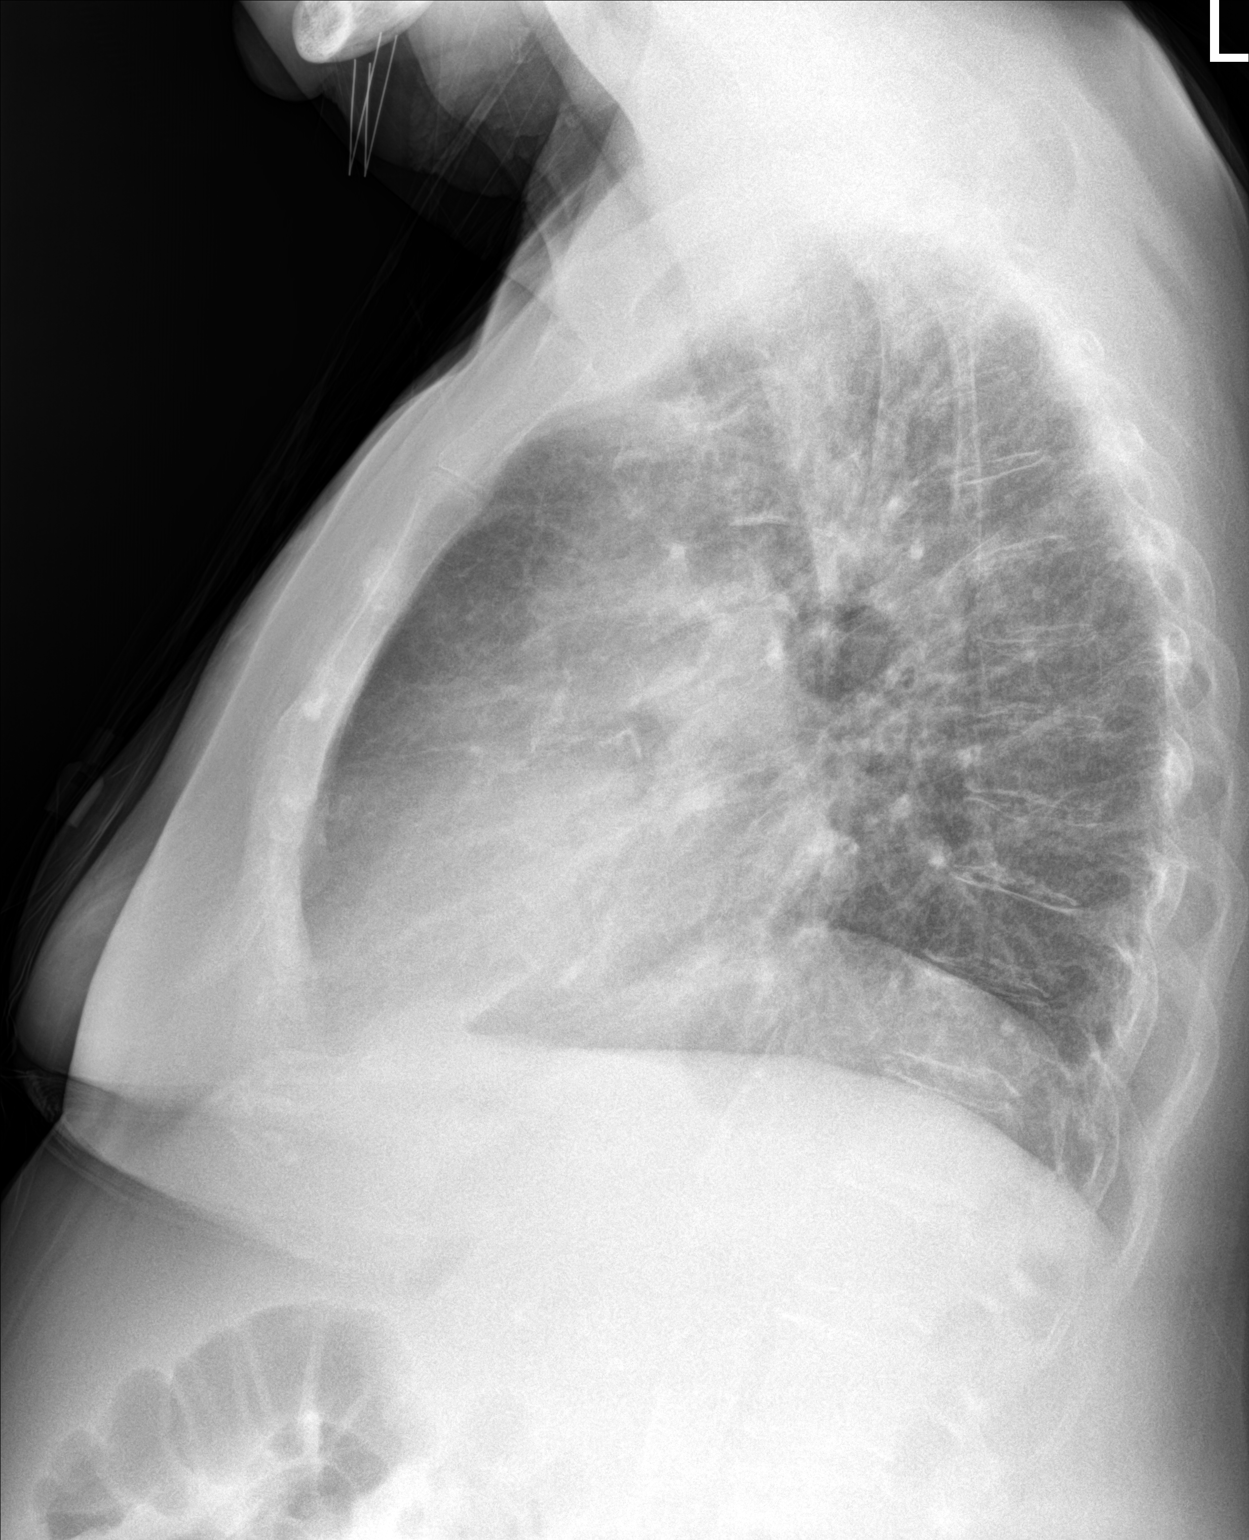

[2 of 2 positions shown; findings below may reference images not displayed]

FINDINGS: Chest: The cardiomediastinal silhouette is within normal limits.
There is dense calcified atherosclerotic plaque of the aortic arch.

Linear opacity in the left upper lobe most likely reflects scarring,
as bandlike opacity is seen in this location on the prior CT. There
is slight asymmetric elevation of the left hemidiaphragm likely
reflecting associated volume loss. Otherwise, there is no focal
consolidation or pulmonary edema. There is no pleural effusion or
pneumothorax.

Ribs: There is no displaced rib fracture or other acute osseous
abnormality identified.
IMPRESSION: 1. No displaced rib fracture or other acute osseous abnormality
identified.
2. Bandlike opacity in the left upper lobe likely reflecting
scarring/fibrosis. No evidence of acute cardiopulmonary process.

## 2022-06-21 NOTE — Progress Notes (Unsigned)
**Note De-Identified Heckard Obfuscation** Patient ID: Hailey Hernandez, female    DOB: 08/24/1956, 67 y.o.   MRN: [id number]  HPI female smoker followed for chronic bronchitis/COPD, chronic hypoxic respiratory failure, tobacco use (1 PPD/45 pack years), OSA, obesity hypoventilation, complicated by psoriasis, Squamous Cell CA lung 2022, Cirrhosis, Multi[ple Lung Nodules, SqCell Lung CA,  O2 2-3 L sleep/exertion, CPAP 13/[facility] Office Spirometry-06/07/17-severe obstructive airways disease, severe restriction of exhaled volume. FVC 1.18/37%, FEV1 0.90/36%, ratio 0.76, FEF 25-75% 0.73/33% CT chest 12/30/20- Cavitary LUL nodule and multiple small nodules Navigation Bronchoscopy Dr Valeta Harms 01/24/21- for LUL nodule met to L hilum by PET -----------------------------------------------------------------------------------------    12/14/21- 66 year old female smoker  followed for chronic bronchitis/COPD, chronic hypoxic respiratory failure, Tobacco use (1 PPD/45 pack years), OSA, obesity hypoventilation, complicated by psoriasis, DM2, SCLung Ca, Cirrhosis, lung nodules, Anemia, Depression/ Anxiety,  -Flutter device, Breztri, albuterol hfa, neb albuterol, Singulair,  -O2 3 L continuous CPAP 5-15/[facility]                                                 Husband here Download compliance 97%, AHI 1.5/ hr Body weight today  Covid vax- 1J&J, 1 Moderna Flu vax- today Completed XRT for SCCA LUL. Ongoing f/u by Dr Julien Nordmann -------Patient has been having pain on left side for about 2 weeks states it feels like pleurisy. Also states that she fell out of bed about 2 weeks ago. PCP did xray showed nothing. Wears 3 liters oxygen all the time. She indicates pain/tenderness to touch and deep breath in the left mid axillary line which has gotten worse rather than better since her fall.  Breathing is otherwise unchanged. Compliant with CPAP.  Download reviewed. Continues follow-up with Oncology for her lung cancer. CXR- 12/08/21 IMPRESSION: 1. No displaced rib fracture or  other acute osseous abnormality identified. 2. Bandlike opacity in the left upper lobe likely reflecting scarring/fibrosis. No evidence of acute cardiopulmonary process.  06/22/22- 66 year old female Smoker(45 pkyrs)  followed for chronic bronchitis/COPD, chronic hypoxic respiratory failure, Tobacco use (1 PPD/45 pack years), OSA, obesity hypoventilation, complicated by psoriasis, DM2, NSCLung Ca/chemo/XRT, Cirrhosis, lung nodules, Anemia, Thrombocytopenia, Depression/ Anxiety,  -Flutter device, Breztri, albuterol hfa, neb albuterol, Singulair, CAD, -O2 3 L pulse CPAP 5-15/[facility]                                                 Download compliance  100%, AHI 0.9/ hr Body weight today 197 lbs Covid vax- 1J&J, 1 Moderna Flu vax- today senior -----Patient has no complaints.    CTchest 06/06/22- IMPRESSION: 1. Stable postradiation changes in treated nodule of the left upper lobe. No evidence of recurrent or metastatic disease. 2. Cirrhotic liver morphology and splenomegaly. 3. Severe left main and three-vessel coronary artery calcifications. 4. Aortic Atherosclerosis (ICD10-I70.0) and Emphysema (ICD10-J43.9).   Review of Systems-see HPI   + = positive Constitutional:   No-   weight loss, night sweats, fevers, chills, fatigue, lassitude. HEENT:    headaches, difficulty swallowing, tooth/dental problems, sore throat,        sneezing, itching,  ear ache,  +nasal congestion, +post nasal drip,  CV:  No-   chest pain, orthopnea, PND, swelling in lower extremities, anasarca,   dizziness, palpitations Resp: + **Note De-Identified Lemonds Obfuscation** shortness of breath with exertion or at rest.               +productive cough,  + non-productive cough,  No- coughing up of blood.              change in color of mucus.  + wheezing.   Skin: +psoriasis GI:  No-   heartburn, indigestion, abdominal pain, nausea, vomiting,  GU: No-    MS:  + joint pain or swelling. . Neuro-     nothing unusual Psych:  No- change in mood or affect. No  depression or anxiety.  No memory loss.   Objective:   Physical Exam General- Alert, Oriented, Affect-appropriate, Distress- none acute, +obese.  On 3L POC O2 sat 94% Skin- + psoriasis plaques- flared since last visit Lymphadenopathy- none Head- atraumatic            Eyes- Gross vision intact, PERRLA, conjunctivae clear secretions            Ears- Hearing ok            Nose-  no-Septal dev, mucus, polyps, erosion, perforation             Throat- Mallampati III , mucosa clear , drainage- none, tonsils- atrophic.                        +dentures Neck- flexible , trachea midline, no stridor , thyroid nl, carotid no bruit Chest - symmetrical excursion , unlabored           Heart/CV- RRR , no murmur , no gallop  , no rub, nl s1 s2                           - JVD- none , edema- none, stasis dermatitis changes+ bilateral,                                 varices- none           Lung- +Diminished,  Wheeze- none, dullness-none,                                                    rub- none, , cough + loose           Chest wall- + tender left mid axillary line where indicated, without crepitus Abd-  Br/ Gen/ Rectal- Not done, not indicated Extrem- +apparent lipoma right medial knee with +stasis changes,  Neuro- grossly intact to observation

## 2022-06-22 ENCOUNTER — Encounter: Payer: Self-pay | Admitting: Internal Medicine

## 2022-06-22 ENCOUNTER — Ambulatory Visit: Payer: Medicare Other | Admitting: Internal Medicine

## 2022-06-22 VITALS — BP 122/60 | HR 98 | Temp 97.9°F | Ht 62.0 in | Wt 197.8 lb

## 2022-06-22 DIAGNOSIS — G4733 Obstructive sleep apnea (adult) (pediatric): Secondary | ICD-10-CM

## 2022-06-22 DIAGNOSIS — J449 Chronic obstructive pulmonary disease, unspecified: Secondary | ICD-10-CM | POA: Diagnosis not present

## 2022-06-22 DIAGNOSIS — Z720 Tobacco use: Secondary | ICD-10-CM | POA: Diagnosis not present

## 2022-06-22 DIAGNOSIS — J9611 Chronic respiratory failure with hypoxia: Secondary | ICD-10-CM

## 2022-06-22 DIAGNOSIS — I25119 Atherosclerotic heart disease of native coronary artery with unspecified angina pectoris: Secondary | ICD-10-CM

## 2022-06-22 DIAGNOSIS — I2510 Atherosclerotic heart disease of native coronary artery without angina pectoris: Secondary | ICD-10-CM

## 2022-06-22 DIAGNOSIS — Z23 Encounter for immunization: Secondary | ICD-10-CM | POA: Diagnosis not present

## 2022-06-22 NOTE — Patient Instructions (Signed)
**Note De-Identified [place]o Obfuscation** Order- Flu vax senior  Order- referral to Cardiology    dx Coronary Artery Disease on CT  We can continue your oxygen at 3L and your current breathing medicines  Dr [name] asked about staying on an antibiotic for prevention, but I don't think we should do that for now unless you have a lot more infection.

## 2022-06-23 DIAGNOSIS — I2510 Atherosclerotic heart disease of native coronary artery without angina pectoris: Secondary | ICD-10-CM | POA: Insufficient documentation

## 2022-06-23 NOTE — Assessment & Plan Note (Addendum)
**Note De-Identified Kops Obfuscation** Continue current meds. Watch need for maintenance antibiotic. Flu vax

## 2022-06-23 NOTE — Assessment & Plan Note (Signed)
**Note De-Identified Dibuono Obfuscation** Refractory despite sustained support and eencouragement

## 2022-06-23 NOTE — Assessment & Plan Note (Signed)
**Note De-Identified Palleschi Obfuscation** Benefits from CPAP with good compliance and control Plan- continue auto 5-15 with O2

## 2022-06-23 NOTE — Assessment & Plan Note (Signed)
**Note De-Identified Grimaldo Obfuscation** Calcification not = obstruction as discussed, but with her smoking hx, her request for cardiology assesment is reasonable.

## 2022-06-23 NOTE — Assessment & Plan Note (Signed)
**Note De-Identified Nappier Obfuscation** Continue O2 3L

## 2022-07-11 DIAGNOSIS — L400 Psoriasis vulgaris: Secondary | ICD-10-CM | POA: Diagnosis not present

## 2022-07-11 DIAGNOSIS — G4733 Obstructive sleep apnea (adult) (pediatric): Secondary | ICD-10-CM | POA: Diagnosis not present

## 2022-07-11 DIAGNOSIS — J449 Chronic obstructive pulmonary disease, unspecified: Secondary | ICD-10-CM | POA: Diagnosis not present

## 2022-07-11 DIAGNOSIS — T85511A Breakdown (mechanical) of esophageal anti-reflux device, initial encounter: Secondary | ICD-10-CM | POA: Diagnosis not present

## 2022-07-23 ENCOUNTER — Ambulatory Visit: Payer: Medicare Other | Admitting: Interventional Cardiology

## 2022-08-11 DIAGNOSIS — G4733 Obstructive sleep apnea (adult) (pediatric): Secondary | ICD-10-CM | POA: Diagnosis not present

## 2022-08-11 DIAGNOSIS — T85511A Breakdown (mechanical) of esophageal anti-reflux device, initial encounter: Secondary | ICD-10-CM | POA: Diagnosis not present

## 2022-08-11 DIAGNOSIS — J449 Chronic obstructive pulmonary disease, unspecified: Secondary | ICD-10-CM | POA: Diagnosis not present

## 2022-08-11 DIAGNOSIS — L400 Psoriasis vulgaris: Secondary | ICD-10-CM | POA: Diagnosis not present

## 2022-08-17 ENCOUNTER — Ambulatory Visit: Payer: Medicare Other | Admitting: Cardiovascular Disease

## 2022-08-17 NOTE — Progress Notes (Deleted)
**Note De-Identified Gonnella Obfuscation** No chief complaint on file.  History of Present Illness: 66 yo female with history of mitral valve regurgitation, severe COPD on home oxygen therapy, tobacco abuse, HTN, hyperlipidemia who is here today to re-establish care in our office. She has been followed in the past by Dr. Percival Spanish and most recently by Dr. Stanford Breed. She was last seen in our office in 2018. Echo April 2018 with LVEF=55-60%. No significant mitral valve regurgitation.   She tells me today that she ***  Primary Care Physician: [name], DO   Past Medical History:  Diagnosis Date   Allergy    Anxiety disorder    Arthritis    bil legs   Asthma    Cancer ([place])    Chronic bronchitis    Complication of anesthesia    slow to wake up, one time she turned blue in recovery   COPD (chronic obstructive pulmonary disease) (HCC)    Depression    Diverticulitis    DJD (degenerative joint disease)    spine   Dyspnea    O2 at 3 L 24/7   Fibromyalgia    GERD (gastroesophageal reflux disease)    Glaucoma    History of thyroid cancer    HTN (hypertension)    Hyperlipidemia    Impaired glucose tolerance 05/08/2013   Lung cancer (HCC)    OSA (obstructive sleep apnea)    CPAP  last sleep study 8-10 yr.ag0   Polymyalgia ([place])    Pre-diabetes    Psoriasis    Rhinitis    Tobacco abuse     Past Surgical History:  Procedure Laterality Date   APPENDECTOMY     BRONCHIAL BIOPSY  01/24/2021   Procedure: BRONCHIAL BIOPSIES;  Surgeon: Garner Nash, DO;  Location: [facility];  Service: Pulmonary;;   BRONCHIAL BRUSHINGS  01/24/2021   Procedure: BRONCHIAL BRUSHINGS;  Surgeon: Garner Nash, DO;  Location: [facility];  Service: Pulmonary;;   BRONCHIAL NEEDLE ASPIRATION BIOPSY  01/24/2021   Procedure: BRONCHIAL NEEDLE ASPIRATION BIOPSIES;  Surgeon: Garner Nash, DO;  Location: [facility];  Service: Pulmonary;;   CARPAL TUNNEL RELEASE  10/30/2011   Procedure: CARPAL TUNNEL RELEASE;  Surgeon: Wynonia Sours, MD;  Location: [facility];  Service: Orthopedics;  Laterality: Right;  and mass excision   CHOLECYSTECTOMY     CHOLESTEATOMA EXCISION     right ear   left shoulder spurs     ORIF right lower leg     partial throidectomy     TOTAL ABDOMINAL HYSTERECTOMY     VIDEO BRONCHOSCOPY WITH ENDOBRONCHIAL NAVIGATION N/A 01/24/2021   Procedure: VIDEO BRONCHOSCOPY WITH ENDOBRONCHIAL NAVIGATION;  Surgeon: Garner Nash, DO;  Location: [facility];  Service: Pulmonary;  Laterality: N/A;   VIDEO BRONCHOSCOPY WITH ENDOBRONCHIAL ULTRASOUND N/A 01/24/2021   Procedure: VIDEO BRONCHOSCOPY WITH ENDOBRONCHIAL ULTRASOUND;  Surgeon: Garner Nash, DO;  Location: [facility];  Service: Pulmonary;  Laterality: N/A;    Current Outpatient Medications  Medication Sig Dispense Refill   albuterol (PROVENTIL) (2.5 MG/3ML) 0.083% nebulizer solution Take 3 mLs (2.5 mg total) by nebulization every 4 (four) hours as needed for wheezing or shortness of breath. DX: 496 180 mL 12   albuterol (VENTOLIN HFA) 108 (90 Base) MCG/ACT inhaler Inhale 2 puffs every 4-6 hors as needed- rescue 18 g 12   alendronate (FOSAMAX) 70 MG tablet Take 1 tablet (70 mg total) by mouth every 7 (seven) days. Take with a full glass of water on **Note De-Identified [place] Obfuscation** an empty stomach. Sunday 12 tablet 3   aspirin EC 81 MG tablet Take 81 mg by mouth daily.     atorvastatin (LIPITOR) 80 MG tablet Take 1 tablet (80 mg total) by mouth daily. 90 tablet 3   Budeson-Glycopyrrol-Formoterol (BREZTRI AEROSPHERE) 160-9-4.8 MCG/ACT AERO Inhale 2 puffs into the lungs 2 (two) times daily. 10.7 g 12   calcium carbonate (CALTRATE 600) 1500 (600 Ca) MG TABS tablet Take 1 tablet (1,500 mg total) by mouth 2 (two) times daily with a meal. 60 tablet 3   cefdinir (OMNICEF) 300 MG capsule Take 1 capsule (300 mg total) by mouth 2 (two) times daily. 1 po BID 20 capsule 0   colchicine 0.6 MG tablet Take 1 tablet (0.6 mg total) by mouth daily. 90 tablet 3   FLUoxetine (PROZAC)  20 MG capsule Take 1 capsule (20 mg total) by mouth daily. 90 capsule 3   fluticasone (FLONASE) 50 MCG/ACT nasal spray USE 2 SPRAYS IN EACH NOSTRIL ONCE DAILY 16 g 5   lisinopril (ZESTRIL) 20 MG tablet Take 1 tablet (20 mg total) by mouth daily. 90 tablet 3   montelukast (SINGULAIR) 10 MG tablet TAKE 1 TABLET EVERY DAY 90 tablet 1   Nebulizers (COMPRESSOR/NEBULIZER) MISC Use up to 4 times daily when needed 1 each 0   OXYGEN Inhale into the lungs. 4L     Respiratory Therapy Supplies (FLUTTER) DEVI Patient to use device for ten minutes, three times daily 1 each 0   TREMFYA 100 MG/ML SOPN Inject into the skin.     triamcinolone ointment (KENALOG) 0.1 % Apply topically 3 (three) times daily.     No current facility-administered medications for this visit.    Allergies  Allergen Reactions   Doxycycline Shortness Of Breath and Other (See Comments)    Drug interaction with Soriatane   Tramadol Shortness Of Breath    Did not work   Milnacipran     REACTION: dizzy   Apremilast Rash    Rash, able to take this currently    Social History   Socioeconomic History   Marital status: Married    Spouse name: Not on file   Number of children: 3   Years of education: 9   Highest education level: Not on file  Occupational History   Occupation: disabled    Employer: DISABLED  Tobacco Use   Smoking status: Every Day    Packs/day: 1.00    Years: 45.00    Total pack years: 45.00    Types: Cigarettes   Smokeless tobacco: Never   Tobacco comments:    Smokes 1 pack a day [facility]C 12/14/2021  Vaping Use   Vaping Use: Never used  Substance and Sexual Activity   Alcohol use: No   Drug use: No   Sexual activity: Not Currently  Other Topics Concern   Not on file  Social History Narrative   Married w/ children   Daily caffeine use   Social Determinants of Health   Financial Resource Strain: Low Risk  (10/23/2021)   Overall Financial Resource Strain (CARDIA)    Difficulty of Paying Living  Expenses: Not hard at all  Food Insecurity: No Food Insecurity (10/23/2021)   Hunger Vital Sign    Worried About Running Out of Food in the Last Year: Never true    Ran Out of Food in the Last Year: Never true  Transportation Needs: No Transportation Needs (10/23/2021)   PRAPARE - Hydrologist (Medical): No **Note De-Identified [place] Obfuscation** Lack of Transportation (Non-Medical): No  Physical Activity: Inactive (10/23/2021)   Exercise Vital Sign    Days of Exercise per Week: 0 days    Minutes of Exercise per Session: 0 min  Stress: No Stress Concern Present (10/23/2021)   [place]    Feeling of Stress : Only a little  Social Connections: Moderately Integrated (10/23/2021)   Social Connection and Isolation Panel [NHANES]    Frequency of Communication with Friends and Family: More than three times a week    Frequency of Social Gatherings with Friends and Family: More than three times a week    Attends Religious Services: 1 to 4 times per year    Active Member of [employer] or Organizations: No    Attends Archivist Meetings: Never    Marital Status: Married  Human resources officer Violence: Not At Risk (10/23/2021)   Humiliation, Afraid, Rape, and Kick questionnaire    Fear of Current or Ex-Partner: No    Emotionally Abused: No    Physically Abused: No    Sexually Abused: No    Family History  Problem Relation Age of Onset   Heart disease Mother    Cancer Mother    Hyperlipidemia Mother    Hypertension Mother    Heart attack Mother    Heart disease Father    Heart attack Father    Emphysema Sister    Hypertension Sister    Heart attack Sister    Emphysema Sister    Alpha-1 antitrypsin deficiency Sister    Alpha-1 antitrypsin deficiency Sister    Allergies Sister    Asthma Sister    Cancer Sister    Hyperlipidemia Daughter    Hypertension Daughter    Cancer Brother    Hyperlipidemia Brother    Hypertension  Brother    Hypertension Son    Tuberculosis Other        grandmother   Breast cancer Neg Hx     Review of Systems:  As stated in the HPI and otherwise negative.   There were no vitals taken for this visit.  Physical Examination: General: Well developed, well nourished, NAD  HEENT: OP clear, mucus membranes moist  SKIN: warm, dry. No rashes. Neuro: No focal deficits  Musculoskeletal: Muscle strength 5/5 all ext  Psychiatric: Mood and affect normal  Neck: No JVD, no carotid bruits, no thyromegaly, no lymphadenopathy.  Lungs:Clear bilaterally, no wheezes, rhonci, crackles Cardiovascular: Regular rate and rhythm. No murmurs, gallops or rubs. Abdomen:Soft. Bowel sounds present. Non-tender.  Extremities: No lower extremity edema. Pulses are 2 + in the bilateral DP/PT.  EKG:  EKG {ACTION; IS/IS JSE:[id number]} ordered today. The ekg ordered today demonstrates ***  Recent Labs: 06/05/2022: ALT 25; BUN 17; Creatinine 0.98; Hemoglobin 11.4; Platelet Count 78; Potassium 4.6; Sodium 141   Lipid Panel    Component Value Date/Time   CHOL 179 12/31/2016 1445   TRIG 263 (H) 12/31/2016 1445   HDL 38 (L) 12/31/2016 1445   CHOLHDL 4.7 (H) 12/31/2016 1445   CHOLHDL 6 12/06/2015 1132   VLDL 51.2 (H) 05/27/2015 1151   LDLCALC 88 12/31/2016 1445   LDLDIRECT 106 (H) 11/08/2017 1411   LDLDIRECT 127.0 12/06/2015 1132     Wt Readings from Last 3 Encounters:  06/22/22 197 lb 12.8 oz (89.7 kg)  06/07/22 196 lb 12.8 oz (89.3 kg)  06/06/22 195 lb (88.5 kg)      Assessment and Plan:   1.   Labs/ **Note De-Identified [place] Obfuscation** tests ordered today include:  No orders of the defined types were placed in this encounter.    Disposition:   F/U with me in ***    Signed, Lauree Chandler, MD, [facility] 08/17/2022 12:20 PM    [facility] [place], [place], [place]  [address] Phone: [phone]; Fax: [phone]

## 2022-09-05 ENCOUNTER — Encounter: Payer: Self-pay | Admitting: Family Medicine

## 2022-09-05 ENCOUNTER — Ambulatory Visit (INDEPENDENT_AMBULATORY_CARE_PROVIDER_SITE_OTHER): Payer: Medicare Other | Admitting: Family Medicine

## 2022-09-05 VITALS — BP 125/66 | HR 101 | Temp 98.8°F | Ht 62.0 in | Wt 192.8 lb

## 2022-09-05 DIAGNOSIS — E1169 Type 2 diabetes mellitus with other specified complication: Secondary | ICD-10-CM | POA: Diagnosis not present

## 2022-09-05 DIAGNOSIS — E119 Type 2 diabetes mellitus without complications: Secondary | ICD-10-CM | POA: Diagnosis not present

## 2022-09-05 DIAGNOSIS — C3412 Malignant neoplasm of upper lobe, left bronchus or lung: Secondary | ICD-10-CM | POA: Diagnosis not present

## 2022-09-05 DIAGNOSIS — I152 Hypertension secondary to endocrine disorders: Secondary | ICD-10-CM

## 2022-09-05 DIAGNOSIS — R197 Diarrhea, unspecified: Secondary | ICD-10-CM

## 2022-09-05 DIAGNOSIS — E785 Hyperlipidemia, unspecified: Secondary | ICD-10-CM

## 2022-09-05 DIAGNOSIS — E1159 Type 2 diabetes mellitus with other circulatory complications: Secondary | ICD-10-CM | POA: Diagnosis not present

## 2022-09-05 DIAGNOSIS — J9611 Chronic respiratory failure with hypoxia: Secondary | ICD-10-CM | POA: Diagnosis not present

## 2022-09-05 DIAGNOSIS — G8928 Other chronic postprocedural pain: Secondary | ICD-10-CM

## 2022-09-05 DIAGNOSIS — I2510 Atherosclerotic heart disease of native coronary artery without angina pectoris: Secondary | ICD-10-CM

## 2022-09-05 LAB — BAYER DCA HB A1C WAIVED: HB A1C (BAYER DCA - WAIVED): 4.5 % — ABNORMAL LOW (ref 4.8–5.6)

## 2022-09-05 MED ORDER — LOPERAMIDE HCL 2 MG PO TABS: 2.0000 mg | ORAL_TABLET | Freq: Four times a day (QID) | ORAL | 99 refills | Status: DC | PRN

## 2022-09-05 MED ORDER — LIDOCAINE 5 % EX PTCH: 1.0000 | MEDICATED_PATCH | CUTANEOUS | 99 refills | Status: AC

## 2022-09-05 MED ORDER — ONDANSETRON 4 MG PO TBDP: 4.0000 mg | ORAL_TABLET | Freq: Three times a day (TID) | ORAL | 0 refills | Status: DC | PRN

## 2022-09-05 NOTE — Progress Notes (Signed)
**Note De-Identified Hovsepian Obfuscation** I have separately seen and examined the patient. I have discussed the findings and exam with student Dr Hailey Hernandez and agree with the below note.  My changes/additions are outlined in BLUE.    S: Patient presents to the office accompanied by her spouse.  She notes that she has been having increased diarrhea over the last 3 weeks.  Denies any blood in stool.  Has had a little bit of nausea but no vomiting.  Tolerating p.o.  No fevers reported.  She continues to suffer from right lower abdominal Hernandez at the postop site.  This has been a chronic issue for her.  Wondering what she can do to alleviate this because sometimes it can become severe.  She saw her pulmonologist recently and has been referred to cardiology.  She will not see the cardiologist until January but apparently was told that she had multi vessel cardiovascular disease.  She is on high-dose Lipitor.  Not currently on beta-blocker.  O: Vitals:   09/05/22 1250  BP: 125/66  Pulse: (!) 101  Temp: 98.8 F (37.1 Hailey)  SpO2: 94%    General appearance: alert and cooperative Eyes: negative findings: lids and lashes normal, conjunctivae and sclerae normal, and corneas clear Lungs:  Globally decreased breath sounds.  Normal work of breathing on supplemental oxygen Schirmer nasal cannula Heart: regular rate and rhythm, S1, S2 normal, no murmur, click, rub or gallop Abdomen:  Obese, soft.  She does have tenderness to palpation to the tissue of the right lower abdomen.  No epigastric tenderness.  No rebound.  No guarding.   A/P:  Diet-controlled diabetes mellitus ([place]) - Plan: Bayer DCA Hb A1c Waived, Microalbumin/Creatinine Ratio, Urine  Hypertension associated with diabetes ([place])  Hyperlipidemia associated with type 2 diabetes mellitus ([place])  Coronary artery disease involving native coronary artery of native heart, unspecified whether angina present  Squamous cell carcinoma of bronchus in left upper lobe (HCC)  Chronic respiratory  failure with hypoxia (HCC)  Chronic post-operative Hernandez - Plan: lidocaine (LIDODERM) 5 %  Diarrhea, unspecified type - Plan: ondansetron (ZOFRAN-ODT) 4 MG disintegrating tablet, loperamide (IMODIUM A-D) 2 MG tablet  Diabetes remains excellently controlled.  Urine microalbumin collected.  Blood pressure controlled.  No changes.  Continue statin.  CAD noted on CT scan from September.  I do question if perhaps we should start her on a beta-blocker and I will CC the cardiologist she will be seeing in January.  Glad to initiate this if they feel that is appropriate  I have given her Lidoderm patches for the Hernandez that she experiences which I suspect is scar tissue and postop Hernandez.  Do not think that she is a good candidate for opioids if can be avoided due to chronic respiratory failure requiring oxygen  For her diarrhea I have sent in Imodium and Zofran.  We discussed that this is not really helping symptoms over the next couple of days that I recommend stool studies and possible checkup with her gastroenterologist.  Hailey Hernandez. Hailey Hernandez, [name]   -------------------------------------------------------------------------------------------------------------------------------------------------------------------------------------      Subjective: [name], [name], DO [id number] Hailey Hernandez is a 66 y.o. female presenting to clinic today for:  Abdominal Hernandez  Diarrhea The patient reports that things have been rough lately. She describes right-sided abdominal Hernandez over her previous surgical scar for the past few months, increasingly affecting her ability to sleep comfortably on her side. Additionally, she has been experiencing diarrhea for the past few weeks. **Note De-Identified [place] Obfuscation** There is no blood in her stool, and no epigastric Hernandez is noted. She mentions occasional nausea and external fatigue. Notably, the patient does not prefer Hernandez medication and has  not attempted any interventions to alleviate these symptoms.  Hypertension  Hyperlipidemia  COPD The patient remains compliant with her medications, including aspirin and atorvastatin. She sees cardiology and has been informed of multi-vessel blockages. An appointment with cardiology is scheduled for October 06, 2022. The patient reports chest Hernandez that worsens with ambulation. However, she also notes some post-prandial chest Hernandez and is uncertain whether it is related to acid reflux. She experiences chronic shortness of breath and had oxygen saturations as low as 64% over the Thanksgiving holiday. No lower extremity edema is reported. She denies changes in vision, headaches, numbness, or tingling in the lower extremities. There is no report of constipation, abdominal Hernandez, muscle Hernandez, or food regurgitation. The patient remains compliant with all of her inhaled medications, including Breztri, Singulair, and Guselkumab.    ROS: Per HPI  Allergies  Allergen Reactions   Doxycycline Shortness Of Breath and Other (See Comments)    Drug interaction with Soriatane   Tramadol Shortness Of Breath    Did not work   Milnacipran     REACTION: dizzy   Apremilast Rash    Rash, able to take this currently   Past Medical History:  Diagnosis Date   Allergy    Anxiety disorder    Arthritis    bil legs   Asthma    Cancer ([place])    Chronic bronchitis    Complication of anesthesia    slow to wake up, one time she turned blue in recovery   COPD (chronic obstructive pulmonary disease) (HCC)    Depression    Diverticulitis    DJD (degenerative joint disease)    spine   Dyspnea    O2 at 3 L 24/7   Fibromyalgia    GERD (gastroesophageal reflux disease)    Glaucoma    History of thyroid cancer    HTN (hypertension)    Hyperlipidemia    Impaired glucose tolerance 05/08/2013   Lung cancer (HCC)    OSA (obstructive sleep apnea)    CPAP  last sleep study 8-10 yr.ag0   Polymyalgia (HCC)     Pre-diabetes    Psoriasis    Rhinitis    Tobacco abuse     Current Outpatient Medications:    albuterol (PROVENTIL) (2.5 MG/3ML) 0.083% nebulizer solution, Take 3 mLs (2.5 mg total) by nebulization every 4 (four) hours as needed for wheezing or shortness of breath. DX: 496, Disp: 180 mL, Rfl: 12   albuterol (VENTOLIN HFA) 108 (90 Base) MCG/ACT inhaler, Inhale 2 puffs every 4-6 hors as needed- rescue, Disp: 18 g, Rfl: 12   alendronate (FOSAMAX) 70 MG tablet, Take 1 tablet (70 mg total) by mouth every 7 (seven) days. Take with a full glass of water on an empty stomach. Sunday, Disp: 12 tablet, Rfl: 3   aspirin EC 81 MG tablet, Take 81 mg by mouth daily., Disp: , Rfl:    atorvastatin (LIPITOR) 80 MG tablet, Take 1 tablet (80 mg total) by mouth daily., Disp: 90 tablet, Rfl: 3   Budeson-Glycopyrrol-Formoterol (BREZTRI AEROSPHERE) 160-9-4.8 MCG/ACT AERO, Inhale 2 puffs into the lungs 2 (two) times daily., Disp: 10.7 g, Rfl: 12   calcium carbonate (CALTRATE 600) 1500 (600 Ca) MG TABS tablet, Take 1 tablet (1,500 mg total) by mouth 2 (two) times daily with a meal., Disp: **Note De-Identified [place] Obfuscation** 60 tablet, Rfl: 3   cefdinir (OMNICEF) 300 MG capsule, Take 1 capsule (300 mg total) by mouth 2 (two) times daily. 1 po BID, Disp: 20 capsule, Rfl: 0   colchicine 0.6 MG tablet, Take 1 tablet (0.6 mg total) by mouth daily., Disp: 90 tablet, Rfl: 3   FLUoxetine (PROZAC) 20 MG capsule, Take 1 capsule (20 mg total) by mouth daily., Disp: 90 capsule, Rfl: 3   fluticasone (FLONASE) 50 MCG/ACT nasal spray, USE 2 SPRAYS IN EACH NOSTRIL ONCE DAILY, Disp: 16 g, Rfl: 5   lisinopril (ZESTRIL) 20 MG tablet, Take 1 tablet (20 mg total) by mouth daily., Disp: 90 tablet, Rfl: 3   montelukast (SINGULAIR) 10 MG tablet, TAKE 1 TABLET EVERY DAY, Disp: 90 tablet, Rfl: 1   Nebulizers (COMPRESSOR/NEBULIZER) MISC, Use up to 4 times daily when needed, Disp: 1 each, Rfl: 0   OXYGEN, Inhale into the lungs. 4L, Disp: , Rfl:    Respiratory Therapy Supplies  (FLUTTER) DEVI, Patient to use device for ten minutes, three times daily, Disp: 1 each, Rfl: 0   TREMFYA 100 MG/ML SOPN, Inject into the skin., Disp: , Rfl:    triamcinolone ointment (KENALOG) 0.1 %, Apply topically 3 (three) times daily., Disp: , Rfl:  Social History   Socioeconomic History   Marital status: Married    Spouse name: Not on file   Number of children: 3   Years of education: 9   Highest education level: Not on file  Occupational History   Occupation: disabled    Employer: DISABLED  Tobacco Use   Smoking status: Every Day    Packs/day: 1.00    Years: 45.00    Total pack years: 45.00    Types: Cigarettes   Smokeless tobacco: Never   Tobacco comments:    Smokes 1 pack a day MRC 12/14/2021  Vaping Use   Vaping Use: Never used  Substance and Sexual Activity   Alcohol use: No   Drug use: No   Sexual activity: Not Currently  Other Topics Concern   Not on file  Social History Narrative   Married w/ children   Daily caffeine use   Social Determinants of Health   Financial Resource Strain: Low Risk  (10/23/2021)   Overall Financial Resource Strain (CARDIA)    Difficulty of Paying Living Expenses: Not hard at all  Food Insecurity: No Food Insecurity (10/23/2021)   Hunger Vital Sign    Worried About Running Out of Food in the Last Year: Never true    Ran Out of Food in the Last Year: Never true  Transportation Needs: No Transportation Needs (10/23/2021)   PRAPARE - Hydrologist (Medical): No    Lack of Transportation (Non-Medical): No  Physical Activity: Inactive (10/23/2021)   Exercise Vital Sign    Days of Exercise per Week: 0 days    Minutes of Exercise per Session: 0 min  Stress: No Stress Concern Present (10/23/2021)   [place]    Feeling of Stress : Only a little  Social Connections: Moderately Integrated (10/23/2021)   Social Connection and Isolation Panel  [NHANES]    Frequency of Communication with Friends and Family: More than three times a week    Frequency of Social Gatherings with Friends and Family: More than three times a week    Attends Religious Services: 1 to 4 times per year    Active Member of Clubs or Organizations: No **Note De-Identified [place] Obfuscation** Attends Archivist Meetings: Never    Marital Status: Married  Human resources officer Violence: Not At Risk (10/23/2021)   Humiliation, Afraid, Rape, and Kick questionnaire    Fear of Current or Ex-Partner: No    Emotionally Abused: No    Physically Abused: No    Sexually Abused: No   Family History  Problem Relation Age of Onset   Heart disease Mother    Cancer Mother    Hyperlipidemia Mother    Hypertension Mother    Heart attack Mother    Heart disease Father    Heart attack Father    Emphysema Sister    Hypertension Sister    Heart attack Sister    Emphysema Sister    Alpha-1 antitrypsin deficiency Sister    Alpha-1 antitrypsin deficiency Sister    Allergies Sister    Asthma Sister    Cancer Sister    Hyperlipidemia Daughter    Hypertension Daughter    Cancer Brother    Hyperlipidemia Brother    Hypertension Brother    Hypertension Son    Tuberculosis Other        grandmother   Breast cancer Neg Hx     Objective: Office vital signs reviewed. There were no vitals taken for this visit.  Physical Exam Constitutional:      General: She is not in acute distress.    Appearance: She is ill-appearing. She is not toxic-appearing or diaphoretic.  Neck:     Vascular: No carotid bruit.  Cardiovascular:     Rate and Rhythm: Normal rate and regular rhythm.     Pulses: Normal pulses.     Heart sounds: Normal heart sounds. No murmur heard.    No friction rub. No gallop.  Pulmonary:     Effort: Prolonged expiration present. No respiratory distress or retractions.     Breath sounds: Decreased air movement and transmitted upper airway sounds present. No stridor. Wheezing present. No rhonchi  or rales.  Chest:     Chest wall: No tenderness.  Abdominal:     General: Bowel sounds are absent.     Palpations: Abdomen is soft. There is no fluid wave or mass.     Tenderness: There is abdominal tenderness in the right upper quadrant.     Comments: Point tenderness medial to surgical scar in the right upper quadrant  Skin:    Comments: Venous stasis in the lower extremities bilaterally  Neurological:     General: No focal deficit present.     Mental Status: She is alert and oriented to person, place, and time. Mental status is at baseline.    Assessment/ Plan: 66 y.o. female   I expect that the patient's abdominal Hernandez is secondary to adhesions from post-surgical scarring, given the location and lack of other associated symptoms. I also expect that the discomfort caused by her diarrhea is exacerbating her Hernandez in this area. I would like to prescribe a lidocaine patch to specifically target this area to provide the patient some relief. Additionally, I will prescribe loperamide to improve the patient's diarrhea, as well as Zofran to address the nausea symptoms. I discussed with the patient and her husband that a probiotic supplement may also improve the patient's symptoms if used in a long-term way.  Regarding the patient's cardiovascular health, given that she has multivessel cardiac blockages, I think it is appropriate to start a cardioselective beta-blocker. No signs of fluid overload were noted on the exam today. The patient is scheduled for **Note De-Identified [place] Obfuscation** a cardiology appointment on October 06, 2021, and I will reach out to cardiology to confirm that it is appropriate to start the patient on this medication now.  No orders of the defined types were placed in this encounter.  No orders of the defined types were placed in this encounter.   Stephani Police, MS3

## 2022-09-06 ENCOUNTER — Encounter: Payer: Self-pay | Admitting: Family Medicine

## 2022-09-07 DIAGNOSIS — G4733 Obstructive sleep apnea (adult) (pediatric): Secondary | ICD-10-CM | POA: Diagnosis not present

## 2022-09-10 DIAGNOSIS — G4733 Obstructive sleep apnea (adult) (pediatric): Secondary | ICD-10-CM | POA: Diagnosis not present

## 2022-09-10 DIAGNOSIS — L400 Psoriasis vulgaris: Secondary | ICD-10-CM | POA: Diagnosis not present

## 2022-09-10 DIAGNOSIS — T85511A Breakdown (mechanical) of esophageal anti-reflux device, initial encounter: Secondary | ICD-10-CM | POA: Diagnosis not present

## 2022-09-10 DIAGNOSIS — J449 Chronic obstructive pulmonary disease, unspecified: Secondary | ICD-10-CM | POA: Diagnosis not present

## 2022-10-05 ENCOUNTER — Ambulatory Visit: Payer: Medicare Other | Admitting: Internal Medicine

## 2022-10-09 ENCOUNTER — Encounter: Payer: Self-pay | Admitting: Nurse Practitioner

## 2022-10-09 ENCOUNTER — Telehealth (INDEPENDENT_AMBULATORY_CARE_PROVIDER_SITE_OTHER): Payer: Medicare Other | Admitting: Nurse Practitioner

## 2022-10-09 DIAGNOSIS — J069 Acute upper respiratory infection, unspecified: Secondary | ICD-10-CM

## 2022-10-09 MED ORDER — AMOXICILLIN-POT CLAVULANATE 875-125 MG PO TABS: 1.0000 | ORAL_TABLET | Freq: Two times a day (BID) | ORAL | 0 refills | Status: DC

## 2022-10-09 MED ORDER — BENZONATATE 100 MG PO CAPS: 100.0000 mg | ORAL_CAPSULE | Freq: Two times a day (BID) | ORAL | 0 refills | Status: DC | PRN

## 2022-10-09 NOTE — Progress Notes (Signed)
**Note De-Identified Fleek Obfuscation** Virtual Visit  Note Due to COVID-19 pandemic this visit was conducted virtually. This visit type was conducted due to national recommendations for restrictions regarding the COVID-19 Pandemic (e.g. social distancing, sheltering in place) in an effort to limit this patient's exposure and mitigate transmission in our community. All issues noted in this document were discussed and addressed.  A physical exam was not performed with this format.  I connected with Hailey Hernandez on 10/09/22 at 1:50 by telephone and verified that I am speaking with the correct person using two identifiers. Hailey Hernandez is currently located at home and no one is currently with her during visit. The provider, Mary-Margaret Hassell Done, FNP is located in their office at time of visit.  I discussed the limitations, risks, security and privacy concerns of performing an evaluation and management service by telephone and the availability of in person appointments. I also discussed with the patient that there may be a patient responsible charge related to this service. The patient expressed understanding and agreed to proceed.   History and Present Illness:  Influenza This is a new problem. The current episode started 1 to 4 weeks ago. The problem occurs intermittently. The problem has been waxing and waning. Associated symptoms include congestion, coughing, a fever (101 last week) and headaches. Pertinent negatives include no chills. Nothing aggravates the symptoms. She has tried NSAIDs and acetaminophen for the symptoms. The treatment provided mild relief.      Review of Systems  Constitutional:  Positive for fever (101 last week). Negative for chills.  HENT:  Positive for congestion.   Respiratory:  Positive for cough and sputum production. Negative for shortness of breath and wheezing.   Neurological:  Positive for headaches. Negative for dizziness.     Observations/Objective: Alert and oriented- answers all  questions appropriately No distress Raspy voice Deep wet cough  Assessment and Plan: Hailey Hernandez in today with chief complaint of Influenza   1. URI with cough and congestion 1. Take meds as prescribed 2. Use a cool mist humidifier especially during the winter months and when heat has been humid. 3. Use saline nose sprays frequently 4. Saline irrigations of the nose can be very helpful if done frequently.  * 4X daily for 1 week*  * Use of a nettie pot can be helpful with this. Follow directions with this* 5. Drink plenty of fluids 6. Keep thermostat turn down low 7.For any cough or congestion- tessalon perles 8. For fever or aces or pains- take tylenol or ibuprofen appropriate for age and weight.  * for fevers greater than 101 orally you may alternate ibuprofen and tylenol every  3 hours.    Meds ordered this encounter  Medications   amoxicillin-clavulanate (AUGMENTIN) 875-125 MG tablet    Sig: Take 1 tablet by mouth 2 (two) times daily.    Dispense:  14 tablet    Refill:  0    Order Specific Question:   Supervising Provider    Answer:   Caryl Pina A [[id number]]   benzonatate (TESSALON) 100 MG capsule    Sig: Take 1 capsule (100 mg total) by mouth 2 (two) times daily as needed for cough.    Dispense:  20 capsule    Refill:  0    Order Specific Question:   Supervising Provider    Answer:   Caryl Pina A [[id number]]       Follow Up Instructions: prn    I discussed the assessment and treatment plan with **[name]** the patient. The patient was provided an opportunity to ask questions and all were answered. The patient agreed with the plan and demonstrated an understanding of the instructions.   The patient was advised to call back or seek an in-person evaluation if the symptoms worsen or if the condition fails to improve as anticipated.  The above assessment and management plan was discussed with the patient. The patient verbalized understanding of and has agreed to  the management plan. Patient is aware to call the clinic if symptoms persist or worsen. Patient is aware when to return to the clinic for a follow-up visit. Patient educated on when it is appropriate to go to the emergency department.   Time call ended:  2:04  I provided 12 minutes of  non face-to-face time during this encounter.    Mary-Margaret Hassell Done, FNP

## 2022-10-10 ENCOUNTER — Telehealth: Payer: Self-pay | Admitting: Family Medicine

## 2022-10-10 NOTE — Telephone Encounter (Signed)
**Note De-Identified Kenna Obfuscation** Left message for patient to call back and schedule Medicare Annual Wellness Visit (AWV) to be completed by video or phone.   Last AWV: 10/23/2021   Please schedule at anytime with [place]     45 minute appointment  Any questions, please contact me at [phone]   Thank you,   [facility] for [place] [facility] ??[phone] or ??[phone]

## 2022-10-11 DIAGNOSIS — L400 Psoriasis vulgaris: Secondary | ICD-10-CM | POA: Diagnosis not present

## 2022-10-11 DIAGNOSIS — T85511A Breakdown (mechanical) of esophageal anti-reflux device, initial encounter: Secondary | ICD-10-CM | POA: Diagnosis not present

## 2022-10-11 DIAGNOSIS — J449 Chronic obstructive pulmonary disease, unspecified: Secondary | ICD-10-CM | POA: Diagnosis not present

## 2022-10-11 DIAGNOSIS — G4733 Obstructive sleep apnea (adult) (pediatric): Secondary | ICD-10-CM | POA: Diagnosis not present

## 2022-10-15 ENCOUNTER — Encounter: Payer: Self-pay | Admitting: Internal Medicine

## 2022-10-23 ENCOUNTER — Ambulatory Visit (HOSPITAL_COMMUNITY)
Admission: RE | Admit: 2022-10-23 | Discharge: 2022-10-23 | Disposition: A | Discharge status: Home / Self Care | Payer: Medicare Other | Source: Ambulatory Visit | Attending: Family Medicine | Admitting: Family Medicine

## 2022-10-23 ENCOUNTER — Encounter: Payer: Self-pay | Admitting: Family Medicine

## 2022-10-23 ENCOUNTER — Ambulatory Visit (INDEPENDENT_AMBULATORY_CARE_PROVIDER_SITE_OTHER): Payer: Medicare Other | Admitting: Family Medicine

## 2022-10-23 VITALS — BP 110/61 | HR 100 | Temp 98.8°F | Ht 62.0 in | Wt 198.0 lb

## 2022-10-23 DIAGNOSIS — R062 Wheezing: Secondary | ICD-10-CM

## 2022-10-23 DIAGNOSIS — C3490 Malignant neoplasm of unspecified part of unspecified bronchus or lung: Secondary | ICD-10-CM | POA: Diagnosis not present

## 2022-10-23 DIAGNOSIS — R109 Unspecified abdominal pain: Secondary | ICD-10-CM | POA: Diagnosis not present

## 2022-10-23 DIAGNOSIS — K6389 Other specified diseases of intestine: Secondary | ICD-10-CM | POA: Diagnosis not present

## 2022-10-23 DIAGNOSIS — R059 Cough, unspecified: Secondary | ICD-10-CM | POA: Diagnosis not present

## 2022-10-23 DIAGNOSIS — R140 Abdominal distension (gaseous): Secondary | ICD-10-CM | POA: Diagnosis not present

## 2022-10-23 DIAGNOSIS — K5730 Diverticulosis of large intestine without perforation or abscess without bleeding: Secondary | ICD-10-CM | POA: Diagnosis not present

## 2022-10-23 LAB — POCT I-STAT CREATININE: Creatinine, Ser: 1.2 mg/dL — ABNORMAL HIGH (ref 0.44–1.00)

## 2022-10-23 MED ORDER — IOHEXOL 300 MG/ML  SOLN
80.0000 mL | Freq: Once | INTRAMUSCULAR | Status: AC | PRN
  Administered 2022-10-23: 80 mL via INTRAVENOUS

## 2022-10-23 MED ORDER — IOHEXOL 9 MG/ML PO SOLN
ORAL | Status: AC
  Filled 2022-10-23: qty 1000

## 2022-10-23 NOTE — Progress Notes (Deleted)
**Note De-Identified Hailey Hernandez Obfuscation** Subjective: FI:LGCKL upper quadrant pain  PCP: Raliegh Ip, DO DIK:[id number] C Gehling is a 67 y.o. female presenting to clinic today for:  Diarrhea   Patient is presenting with two weeks of right upper quadrant pain that has progrssively worsened over the past two weeks. First noticed the pain following a viral illness. Concern for metastic spread of disease, possibly to liver given locaiton of pain. Also on Patinet does not have her gallbladder making this less likely. Given right upper quadrant pain, I will order a CT with contrast to visualize possible div   ROS: Per HPI  Allergies  Allergen Reactions   Doxycycline Shortness Of Breath and Other (See Comments)    Drug interaction with Soriatane   Tramadol Shortness Of Breath    Did not work   Milnacipran     REACTION: dizzy   Apremilast Rash    Rash, able to take this currently   Past Medical History:  Diagnosis Date   Allergy    Anxiety disorder    Arthritis    bil legs   Asthma    Cancer (HCC)    Chronic bronchitis    Complication of anesthesia    slow to wake up, one time she turned blue in recovery   COPD (chronic obstructive pulmonary disease) (HCC)    Depression    Diverticulitis    DJD (degenerative joint disease)    spine   Dyspnea    O2 at 3 L 24/7   Fibromyalgia    GERD (gastroesophageal reflux disease)    Glaucoma    History of thyroid cancer    HTN (hypertension)    Hyperlipidemia    Impaired glucose tolerance 05/08/2013   Lung cancer (HCC)    OSA (obstructive sleep apnea)    CPAP  last sleep study 8-10 yr.ag0   Polymyalgia (HCC)    Pre-diabetes    Psoriasis    Rhinitis    Tobacco abuse     Current Outpatient Medications:    albuterol (PROVENTIL) (2.5 MG/3ML) 0.083% nebulizer solution, Take 3 mLs (2.5 mg total) by nebulization every 4 (four) hours as needed for wheezing or shortness of breath. DX: 496, Disp: 180 mL, Rfl: 12   albuterol (VENTOLIN HFA) 108 (90 Base) MCG/ACT inhaler,  Inhale 2 puffs every 4-6 hors as needed- rescue, Disp: 18 g, Rfl: 12   alendronate (FOSAMAX) 70 MG tablet, Take 1 tablet (70 mg total) by mouth every 7 (seven) days. Take with a full glass of water on an empty stomach. Sunday, Disp: 12 tablet, Rfl: 3   amoxicillin-clavulanate (AUGMENTIN) 875-125 MG tablet, Take 1 tablet by mouth 2 (two) times daily., Disp: 14 tablet, Rfl: 0   aspirin EC 81 MG tablet, Take 81 mg by mouth daily., Disp: , Rfl:    atorvastatin (LIPITOR) 80 MG tablet, Take 1 tablet (80 mg total) by mouth daily., Disp: 90 tablet, Rfl: 3   benzonatate (TESSALON) 100 MG capsule, Take 1 capsule (100 mg total) by mouth 2 (two) times daily as needed for cough., Disp: 20 capsule, Rfl: 0   Budeson-Glycopyrrol-Formoterol (BREZTRI AEROSPHERE) 160-9-4.8 MCG/ACT AERO, Inhale 2 puffs into the lungs 2 (two) times daily., Disp: 10.7 g, Rfl: 12   calcium carbonate (CALTRATE 600) 1500 (600 Ca) MG TABS tablet, Take 1 tablet (1,500 mg total) by mouth 2 (two) times daily with a meal., Disp: 60 tablet, Rfl: 3   colchicine 0.6 MG tablet, Take 1 tablet (0.6 mg total) by mouth daily., Disp: 90 tablet, **Note De-Identified Sass Obfuscation** Rfl: 3   FLUoxetine (PROZAC) 20 MG capsule, Take 1 capsule (20 mg total) by mouth daily., Disp: 90 capsule, Rfl: 3   fluticasone (FLONASE) 50 MCG/ACT nasal spray, USE 2 SPRAYS IN EACH NOSTRIL ONCE DAILY, Disp: 16 g, Rfl: 5   lidocaine (LIDODERM) 5 %, Place 1 patch onto the skin daily. Remove & Discard patch within 12 hours or as directed by MD, Disp: 30 patch, Rfl: PRN   lisinopril (ZESTRIL) 20 MG tablet, Take 1 tablet (20 mg total) by mouth daily., Disp: 90 tablet, Rfl: 3   loperamide (IMODIUM A-D) 2 MG tablet, Take 1 tablet (2 mg total) by mouth 4 (four) times daily as needed for diarrhea or loose stools., Disp: 30 tablet, Rfl: PRN   montelukast (SINGULAIR) 10 MG tablet, TAKE 1 TABLET EVERY DAY, Disp: 90 tablet, Rfl: 1   Nebulizers (COMPRESSOR/NEBULIZER) MISC, Use up to 4 times daily when needed, Disp: 1 each,  Rfl: 0   ondansetron (ZOFRAN-ODT) 4 MG disintegrating tablet, Take 1 tablet (4 mg total) by mouth every 8 (eight) hours as needed for nausea or vomiting., Disp: 20 tablet, Rfl: 0   OXYGEN, Inhale into the lungs. 4L, Disp: , Rfl:    Respiratory Therapy Supplies (FLUTTER) DEVI, Patient to use device for ten minutes, three times daily, Disp: 1 each, Rfl: 0   TREMFYA 100 MG/ML SOPN, Inject into the skin., Disp: , Rfl:    triamcinolone ointment (KENALOG) 0.1 %, Apply topically 3 (three) times daily., Disp: , Rfl:  Social History   Socioeconomic History   Marital status: Married    Spouse name: Not on file   Number of children: 3   Years of education: 9   Highest education level: Not on file  Occupational History   Occupation: disabled    Employer: DISABLED  Tobacco Use   Smoking status: Every Day    Packs/day: 1.00    Years: 45.00    Total pack years: 45.00    Types: Cigarettes   Smokeless tobacco: Never   Tobacco comments:    Smokes 1 pack a day [facility] 12/14/2021  Vaping Use   Vaping Use: Never used  Substance and Sexual Activity   Alcohol use: No   Drug use: No   Sexual activity: Not Currently  Other Topics Concern   Not on file  Social History Narrative   Married w/ children   Daily caffeine use   Social Determinants of Health   Financial Resource Strain: Low Risk  (10/23/2021)   Overall Financial Resource Strain (CARDIA)    Difficulty of Paying Living Expenses: Not hard at all  Food Insecurity: No Food Insecurity (10/23/2021)   Hunger Vital Sign    Worried About Running Out of Food in the Last Year: Never true    Ran Out of Food in the Last Year: Never true  Transportation Needs: No Transportation Needs (10/23/2021)   PRAPARE - Administrator, Civil Service (Medical): No    Lack of Transportation (Non-Medical): No  Physical Activity: Inactive (10/23/2021)   Exercise Vital Sign    Days of Exercise per Week: 0 days    Minutes of Exercise per Session: 0 min   Stress: No Stress Concern Present (10/23/2021)   [employer] of Occupational Health - Occupational Stress Questionnaire    Feeling of Stress : Only a little  Social Connections: Moderately Integrated (10/23/2021)   Social Connection and Isolation Panel [NHANES]    Frequency of Communication with Friends and Family: More than **Note De-Identified Boer Obfuscation** three times a week    Frequency of Social Gatherings with Friends and Family: More than three times a week    Attends Religious Services: 1 to 4 times per year    Active Member of [employer] or Organizations: No    Attends Banker Meetings: Never    Marital Status: Married  Catering manager Violence: Not At Risk (10/23/2021)   Humiliation, Afraid, Rape, and Kick questionnaire    Fear of Current or Ex-Partner: No    Emotionally Abused: No    Physically Abused: No    Sexually Abused: No   Family History  Problem Relation Age of Onset   Heart disease Mother    Cancer Mother    Hyperlipidemia Mother    Hypertension Mother    Heart attack Mother    Heart disease Father    Heart attack Father    Emphysema Sister    Hypertension Sister    Heart attack Sister    Emphysema Sister    Alpha-1 antitrypsin deficiency Sister    Alpha-1 antitrypsin deficiency Sister    Allergies Sister    Asthma Sister    Cancer Sister    Hyperlipidemia Daughter    Hypertension Daughter    Cancer Brother    Hyperlipidemia Brother    Hypertension Brother    Hypertension Son    Tuberculosis Other        grandmother   Breast cancer Neg Hx     Objective: Office vital signs reviewed. There were no vitals taken for this visit.  Physical Exam  *** General: Awake, alert, well nourished, No acute distress HEENT: Normal    Neck: No masses palpated. No lymphadenopathy    Ears: Tympanic membranes intact, normal light reflex, no erythema, no bulging    Eyes: PERRLA, extraocular membranes intact, sclera white    Nose: nasal turbinates moist, no nasal discharge ***     Throat: moist mucus membranes, no erythema, no tonsillar exudate.  Airway is patent Cardio: regular rate and rhythm, S1S2 heard, no murmurs appreciated Pulm: clear to auscultation bilaterally, no wheezes, rhonchi or rales; normal work of breathing on room air GI: soft, non-tender, non-distended, bowel sounds present x4, no hepatomegaly, no splenomegaly, no masses Extremities: warm, well perfused, No edema, cyanosis or clubbing; +1 pulses bilaterally Skin: dry; intact; no rashes or lesions  Assessment/ Plan: 67 y.o. female   ***  No orders of the defined types were placed in this encounter.  No orders of the defined types were placed in this encounter.    Deri Fuelling, MS3

## 2022-10-23 NOTE — Progress Notes (Signed)
**Note De-Identified Hailey Hernandez** Subjective: FP:XNIMWVRFJ pain PCP: Raliegh Ip, DO OKJ:[id number] C Tollison is a 67 y.o. female presenting to clinic today for:  1.  Abdominal pain Patient reports about a 2-week history of right-sided abdominal pain that has been gradually worsening.  She reports associated nausea and vomiting but that has been getting better.  She was recently sick with influenza and has ongoing dry cough and wheezing.  She is not utilizing her nebulizer machine.  She has a medical history significant for lung cancer and is on chronic O2.  She does report that she has been having some constipation type bowel movements.  She has known diverticulosis.  No fevers reported.  No blood in stool.   ROS: Per HPI  Allergies  Allergen Reactions   Doxycycline Shortness Of Breath and Other (See Comments)    Drug interaction with Soriatane   Tramadol Shortness Of Breath    Did not work   Milnacipran     REACTION: dizzy   Apremilast Rash    Rash, able to take this currently   Past Medical History:  Diagnosis Date   Allergy    Anxiety disorder    Arthritis    bil legs   Asthma    Cancer (HCC)    Chronic bronchitis    Complication of anesthesia    slow to wake up, one time she turned blue in recovery   COPD (chronic obstructive pulmonary disease) (HCC)    Depression    Diverticulitis    DJD (degenerative joint disease)    spine   Dyspnea    O2 at 3 L 24/7   Fibromyalgia    GERD (gastroesophageal reflux disease)    Glaucoma    History of thyroid cancer    HTN (hypertension)    Hyperlipidemia    Impaired glucose tolerance 05/08/2013   Lung cancer (HCC)    OSA (obstructive sleep apnea)    CPAP  last sleep study 8-10 yr.ag0   Polymyalgia (HCC)    Pre-diabetes    Psoriasis    Rhinitis    Tobacco abuse     Current Outpatient Medications:    albuterol (PROVENTIL) (2.5 MG/3ML) 0.083% nebulizer solution, Take 3 mLs (2.5 mg total) by nebulization every 4 (four) hours as needed for wheezing or  shortness of breath. DX: 496, Disp: 180 mL, Rfl: 12   albuterol (VENTOLIN HFA) 108 (90 Base) MCG/ACT inhaler, Inhale 2 puffs every 4-6 hors as needed- rescue, Disp: 18 g, Rfl: 12   amoxicillin-clavulanate (AUGMENTIN) 875-125 MG tablet, Take 1 tablet by mouth 2 (two) times daily., Disp: 14 tablet, Rfl: 0   aspirin EC 81 MG tablet, Take 81 mg by mouth daily., Disp: , Rfl:    atorvastatin (LIPITOR) 80 MG tablet, Take 1 tablet (80 mg total) by mouth daily., Disp: 90 tablet, Rfl: 3   Budeson-Glycopyrrol-Formoterol (BREZTRI AEROSPHERE) 160-9-4.8 MCG/ACT AERO, Inhale 2 puffs into the lungs 2 (two) times daily., Disp: 10.7 g, Rfl: 12   calcium carbonate (CALTRATE 600) 1500 (600 Ca) MG TABS tablet, Take 1 tablet (1,500 mg total) by mouth 2 (two) times daily with a meal., Disp: 60 tablet, Rfl: 3   colchicine 0.6 MG tablet, Take 1 tablet (0.6 mg total) by mouth daily., Disp: 90 tablet, Rfl: 3   FLUoxetine (PROZAC) 20 MG capsule, Take 1 capsule (20 mg total) by mouth daily., Disp: 90 capsule, Rfl: 3   fluticasone (FLONASE) 50 MCG/ACT nasal spray, USE 2 SPRAYS IN EACH NOSTRIL ONCE DAILY, Disp: 16 g, **Note De-Identified [name]** Rfl: 5   lidocaine (LIDODERM) 5 %, Place 1 patch onto the skin daily. Remove & Discard patch within 12 hours or as directed by MD, Disp: 30 patch, Rfl: PRN   lisinopril (ZESTRIL) 20 MG tablet, Take 1 tablet (20 mg total) by mouth daily., Disp: 90 tablet, Rfl: 3   montelukast (SINGULAIR) 10 MG tablet, TAKE 1 TABLET EVERY DAY, Disp: 90 tablet, Rfl: 1   Nebulizers (COMPRESSOR/NEBULIZER) MISC, Use up to 4 times daily when needed, Disp: 1 each, Rfl: 0   ondansetron (ZOFRAN-ODT) 4 MG disintegrating tablet, Take 1 tablet (4 mg total) by mouth every 8 (eight) hours as needed for nausea or vomiting., Disp: 20 tablet, Rfl: 0   OXYGEN, Inhale into the lungs. 4L, Disp: , Rfl:    Respiratory Therapy Supplies (FLUTTER) DEVI, Patient to use device for ten minutes, three times daily, Disp: 1 each, Rfl: 0   TREMFYA 100 MG/ML SOPN,  Inject into the skin., Disp: , Rfl:  Social History   Socioeconomic History   Marital status: Married    Spouse name: Not on file   Number of children: 3   Years of education: 9   Highest education level: Not on file  Occupational History   Occupation: disabled    Employer: DISABLED  Tobacco Use   Smoking status: Every Day    Packs/day: 1.00    Years: 45.00    Total pack years: 45.00    Types: Cigarettes   Smokeless tobacco: Never   Tobacco comments:    Smokes 1 pack a day [facility] 12/14/2021  Vaping Use   Vaping Use: Never used  Substance and Sexual Activity   Alcohol use: No   Drug use: No   Sexual activity: Not Currently  Other Topics Concern   Not on file  Social History Narrative   Married w/ children   Daily caffeine use   Social Determinants of Health   Financial Resource Strain: Low Risk  (10/23/2021)   Overall Financial Resource Strain (CARDIA)    Difficulty of Paying Living Expenses: Not hard at all  Food Insecurity: No Food Insecurity (10/23/2021)   Hunger Vital Sign    Worried About Running Out of Food in the Last Year: Never true    Ran Out of Food in the Last Year: Never true  Transportation Needs: No Transportation Needs (10/23/2021)   PRAPARE - Administrator, Civil Service (Medical): No    Lack of Transportation (Non-Medical): No  Physical Activity: Inactive (10/23/2021)   Exercise Vital Sign    Days of Exercise per Week: 0 days    Minutes of Exercise per Session: 0 min  Stress: No Stress Concern Present (10/23/2021)   [employer] of Occupational Health - Occupational Stress Questionnaire    Feeling of Stress : Only a little  Social Connections: Moderately Integrated (10/23/2021)   Social Connection and Isolation Panel [NHANES]    Frequency of Communication with Friends and Family: More than three times a week    Frequency of Social Gatherings with Friends and Family: More than three times a week    Attends Religious Services: 1 to 4  times per year    Active Member of [employer] or Organizations: No    Attends Banker Meetings: Never    Marital Status: Married  Catering manager Violence: Not At Risk (10/23/2021)   Humiliation, Afraid, Rape, and Kick questionnaire    Fear of Current or Ex-Partner: No    Emotionally Abused: No **Note De-Identified Baize Hernandez** Physically Abused: No    Sexually Abused: No   Family History  Problem Relation Age of Onset   Heart disease Mother    Cancer Mother    Hyperlipidemia Mother    Hypertension Mother    Heart attack Mother    Heart disease Father    Heart attack Father    Emphysema Sister    Hypertension Sister    Heart attack Sister    Emphysema Sister    Alpha-1 antitrypsin deficiency Sister    Alpha-1 antitrypsin deficiency Sister    Allergies Sister    Asthma Sister    Cancer Sister    Hyperlipidemia Daughter    Hypertension Daughter    Cancer Brother    Hyperlipidemia Brother    Hypertension Brother    Hypertension Son    Tuberculosis Other        grandmother   Breast cancer Neg Hx     Objective: Office vital signs reviewed. BP 110/61   Pulse 100   Temp 98.8 F (37.1 C)   Ht 5\' 2"  (1.575 m)   Wt 198 lb (89.8 kg)   BMI 36.21 kg/m   Physical Examination:  General: Awake, alert, chronically ill appearing female, No acute distress HEENT: sclera white Cardio: regular rate and rhythm, S1S2 heard, no murmurs appreciated Pulm: Globally decreased breath sounds.  Mild expiratory wheezes noted.  She is not coughing.  Normal work of breathing on supplemental oxygen Dercole the nasal cannula GI: Protuberant but distended and appears to have ascites.  She has moderate tenderness to palpation along the right upper quadrant and right middle side of the abdomen.  No tenderness palpation of the left side of the abdomen or in the right lower quadrant.  Assessment/ Plan: 67 y.o. female   Right sided abdominal pain - Plan: CT Abdomen Pelvis W Contrast  Wheezing - Plan: DG Chest 2  View  Abdominal distension - Plan: CT Abdomen Pelvis W Contrast  I am concerned about this patient with history of lung cancer.  She has what appears to be fluid in the abdomen and I worry about possible mets to that liver given the right upper quadrant tenderness.  Differential diagnosis includes diverticulitis given known diverticulosis and change in bowel habits recently.  We will order stat abdomen pelvis CAT scan and further workup pending this result.  I also obtained a stat chest x-ray given recent pulmonary illness and ongoing cough and this very complex female  Orders Placed This Encounter  Procedures   CT Abdomen Pelvis W Contrast    Standing Status:   Future    Standing Expiration Date:   10/24/2023    Order Specific Question:   If indicated for the ordered procedure, I authorize the administration of contrast media per Radiology protocol    Answer:   Yes    Order Specific Question:   Does the patient have a contrast media/X-ray dye allergy?    Answer:   No    Order Specific Question:   Preferred imaging location?    Answer:   [facility]    Order Specific Question:   Is Oral Contrast requested for this exam?    Answer:   Yes, Per Radiology protocol   DG Chest 2 View    Standing Status:   Future    Standing Expiration Date:   10/24/2023    Order Specific Question:   Reason for Exam (SYMPTOM  OR DIAGNOSIS REQUIRED)    Answer:   cough after flu, **Note De-Identified Rehman Hernandez** has cancer    Order Specific Question:   Preferred imaging location?    Answer:   Internal   No orders of the defined types were placed in this encounter.    Raliegh Ip, DO [facility] [phone]

## 2022-10-24 ENCOUNTER — Other Ambulatory Visit: Payer: Self-pay | Admitting: Family Medicine

## 2022-10-24 DIAGNOSIS — K5792 Diverticulitis of intestine, part unspecified, without perforation or abscess without bleeding: Secondary | ICD-10-CM

## 2022-10-24 DIAGNOSIS — R188 Other ascites: Secondary | ICD-10-CM

## 2022-10-24 MED ORDER — AMOXICILLIN-POT CLAVULANATE 875-125 MG PO TABS: 1.0000 | ORAL_TABLET | Freq: Two times a day (BID) | ORAL | 0 refills | Status: AC

## 2022-10-25 ENCOUNTER — Ambulatory Visit: Payer: Medicare Other | Admitting: Internal Medicine

## 2022-10-26 ENCOUNTER — Telehealth: Payer: Self-pay | Admitting: Family Medicine

## 2022-10-26 NOTE — Telephone Encounter (Signed)
**Note De-Identified Stocking Obfuscation** Attempted to reach but no answer.  Please inquire as below.

## 2022-10-26 NOTE — Telephone Encounter (Signed)
**Note De-Identified [place]amore Obfuscation** Attempted to reach Hailey Hernandez for a check in.  I received this message from her GI provider, who will attempt to schedule her as well.  If she is still having pain or if she is developing any of the below symptoms, I will proceed with ordering of paracentesis to drain some of that fluid.  "In any event, if she is not having symptoms referable to ascites (such as uncomfortable distention or pain) then there is really nothing you need to do prior to the visit.  Not much to do from a medical standpoint given her, unfortunately, overall issues.  Her cirrhosis looks advanced.   However, if she is having symptoms referable to ascites then I would recommend the following:  1.  Ultrasound-guided paracentesis up to 3 L  2.  Send fluid for cell count with differential, albumin, protein, and cytology "

## 2022-10-26 NOTE — Telephone Encounter (Signed)
**Note De-Identified Stopper Obfuscation** Patient returning call. Would like to speak to PCP. Please call back

## 2022-10-29 ENCOUNTER — Other Ambulatory Visit: Payer: Self-pay | Admitting: Family Medicine

## 2022-10-29 ENCOUNTER — Encounter: Payer: Self-pay | Admitting: Family Medicine

## 2022-10-29 DIAGNOSIS — R197 Diarrhea, unspecified: Secondary | ICD-10-CM

## 2022-10-29 NOTE — Telephone Encounter (Signed)
**Note De-identified Wolfert Obfuscation** Attempted to call pt no answer

## 2022-10-29 NOTE — Progress Notes (Deleted)
**Note De-Identified [place] Obfuscation** Cardiology Office Note:    Date:  10/29/2022   ID:  Hailey Hernandez, DOB [birth year], MRN [id number]  PCP:  [name], DO   [facility] Providers Cardiologist:  None { Click to update primary MD,subspecialty MD or APP then REFRESH:1}    Referring MD: Deneise Lever, MD   CC: *** Consulted for the evaluation of CAD at the behest of Dr. Annamaria Boots   History of Present Illness:    Hailey Hernandez is a 67 y.o. female with a hx of CAD, HTN, SVT NOS, COPD and OSA on O2 with prior squamous cell lung cancer, DM, Carpal Tunnel.  Active Tobacco Use and morbid obesity.  HLD.  Patient notes that she is feeling ***.   Was last feeling well ***. Able to ***  Has had no chest pain, chest pressure, chest tightness, chest stinging ***.  Discomfort occurs with ***, worsens with ***, and improves with ***.    Patient exertion notable for *** with *** and feels no symptoms.    No shortness of breath, DOE ***.  No PND or orthopnea***.  No weight gain***, leg swelling ***, or abdominal swelling***.  No syncope or near syncope ***. Notes *** no palpitations or funny heart beats.     Patient reports prior cardiac testing including ***  No history of ***pre-eclampsia, gestation HTN or gestational DM.  No Fen-Phen or drug use***.  Ambulatory BP ***.   Past Medical History:  Diagnosis Date   Allergy    Anxiety disorder    Arthritis    bil legs   Asthma    Cancer ([place])    Chronic bronchitis    Complication of anesthesia    slow to wake up, one time she turned blue in recovery   COPD (chronic obstructive pulmonary disease) (HCC)    Depression    Diverticulitis    DJD (degenerative joint disease)    spine   Dyspnea    O2 at 3 L 24/7   Fibromyalgia    GERD (gastroesophageal reflux disease)    Glaucoma    History of thyroid cancer    HTN (hypertension)    Hyperlipidemia    Impaired glucose tolerance 05/08/2013   Lung cancer (HCC)    OSA (obstructive sleep apnea)     CPAP  last sleep study 8-10 yr.ag0   Polymyalgia ([place])    Pre-diabetes    Psoriasis    Rhinitis    Tobacco abuse     Past Surgical History:  Procedure Laterality Date   APPENDECTOMY     BRONCHIAL BIOPSY  01/24/2021   Procedure: BRONCHIAL BIOPSIES;  Surgeon: Garner Nash, DO;  Location: [facility];  Service: Pulmonary;;   BRONCHIAL BRUSHINGS  01/24/2021   Procedure: BRONCHIAL BRUSHINGS;  Surgeon: Garner Nash, DO;  Location: [facility];  Service: Pulmonary;;   BRONCHIAL NEEDLE ASPIRATION BIOPSY  01/24/2021   Procedure: BRONCHIAL NEEDLE ASPIRATION BIOPSIES;  Surgeon: Garner Nash, DO;  Location: [facility];  Service: Pulmonary;;   CARPAL TUNNEL RELEASE  10/30/2011   Procedure: CARPAL TUNNEL RELEASE;  Surgeon: Wynonia Sours, MD;  Location: [facility];  Service: Orthopedics;  Laterality: Right;  and mass excision   CHOLECYSTECTOMY     CHOLESTEATOMA EXCISION     right ear   left shoulder spurs     ORIF right lower leg     partial throidectomy     TOTAL ABDOMINAL HYSTERECTOMY     VIDEO BRONCHOSCOPY WITH ENDOBRONCHIAL **Note De-Identified [place] Obfuscation** NAVIGATION N/A 01/24/2021   Procedure: VIDEO BRONCHOSCOPY WITH ENDOBRONCHIAL NAVIGATION;  Surgeon: Garner Nash, DO;  Location: [facility];  Service: Pulmonary;  Laterality: N/A;   VIDEO BRONCHOSCOPY WITH ENDOBRONCHIAL ULTRASOUND N/A 01/24/2021   Procedure: VIDEO BRONCHOSCOPY WITH ENDOBRONCHIAL ULTRASOUND;  Surgeon: Garner Nash, DO;  Location: [facility];  Service: Pulmonary;  Laterality: N/A;    Current Medications: No outpatient medications have been marked as taking for the 10/30/22 encounter (Appointment) with Werner Lean, MD.     Allergies:   Doxycycline, Tramadol, Milnacipran, and Apremilast   Social History   Socioeconomic History   Marital status: Married    Spouse name: Not on file   Number of children: 3   Years of education: 9   Highest education level: Not on file  Occupational History   Occupation:  disabled    Employer: DISABLED  Tobacco Use   Smoking status: Every Day    Packs/day: 1.00    Years: 45.00    Total pack years: 45.00    Types: Cigarettes   Smokeless tobacco: Never   Tobacco comments:    Smokes 1 pack a day MRC 12/14/2021  Vaping Use   Vaping Use: Never used  Substance and Sexual Activity   Alcohol use: No   Drug use: No   Sexual activity: Not Currently  Other Topics Concern   Not on file  Social History Narrative   Married w/ children   Daily caffeine use   Social Determinants of Health   Financial Resource Strain: Low Risk  (10/23/2021)   Overall Financial Resource Strain (CARDIA)    Difficulty of Paying Living Expenses: Not hard at all  Food Insecurity: No Food Insecurity (10/23/2021)   Hunger Vital Sign    Worried About Running Out of Food in the Last Year: Never true    Ran Out of Food in the Last Year: Never true  Transportation Needs: No Transportation Needs (10/23/2021)   PRAPARE - Hydrologist (Medical): No    Lack of Transportation (Non-Medical): No  Physical Activity: Inactive (10/23/2021)   Exercise Vital Sign    Days of Exercise per Week: 0 days    Minutes of Exercise per Session: 0 min  Stress: No Stress Concern Present (10/23/2021)   [place]    Feeling of Stress : Only a little  Social Connections: Moderately Integrated (10/23/2021)   Social Connection and Isolation Panel [NHANES]    Frequency of Communication with Friends and Family: More than three times a week    Frequency of Social Gatherings with Friends and Family: More than three times a week    Attends Religious Services: 1 to 4 times per year    Active Member of [employer] or Organizations: No    Attends Archivist Meetings: Never    Marital Status: Married     Family History: The patient's ***family history includes Allergies in her sister; Alpha-1 antitrypsin deficiency in her  sister and sister; Asthma in her sister; Cancer in her brother, mother, and sister; Emphysema in her sister and sister; Heart attack in her father, mother, and sister; Heart disease in her father and mother; Hyperlipidemia in her brother, daughter, and mother; Hypertension in her brother, daughter, mother, sister, and son; Tuberculosis in an other family member. There is no history of Breast cancer.  ROS:   Please see the history of present illness.    *** All other systems reviewed **Note De-Identified [place] Obfuscation** and are negative.  EKGs/Labs/Other Studies Reviewed:    The following studies were reviewed today: ***  EKG:  EKG is *** ordered today.  The ekg ordered today demonstrates ***   Cardiac Studies & Procedures       ECHOCARDIOGRAM  ECHOCARDIOGRAM COMPLETE 01/25/2017  Narrative *[facility]* [address], [place] [address] [phone]  ------------------------------------------------------------------- Transthoracic Echocardiography  Patient:    Hailey Hernandez, Hailey Hernandez MR #:       [id number] Study Date: 01/25/2017 Gender:     F Age:        70 Height:     162.6 cm Weight:     108 kg BSA:        2.26 m^2 Pt. Status: Room:  SONOGRAPHER  Charlann Noss, RDCS ATTENDING    Kirk Ruths ORDERING     Kirk Ruths REFERRING    Kirk Ruths PERFORMING   [facility], Outpatient  cc:  ------------------------------------------------------------------- LV EF: 55% -   60%  ------------------------------------------------------------------- Indications:      MVD (I 05.9).  ------------------------------------------------------------------- History:   PMH:  Asthma. OSA. Acquired from the patient and from the patient&'s chart.  Tachycardia.  Chronic obstructive pulmonary disease. The patient uses continuous supplemental oxygen.  Risk factors:  Current tobacco use. Hypertension. Obese. Dyslipidemia.  ------------------------------------------------------------------- Study Conclusions  -  Left ventricle: The cavity size was normal. Wall thickness was normal. Systolic function was normal. The estimated ejection fraction was in the range of 55% to 60%. There is hypokinesis of the basalinferior myocardium. Doppler parameters are consistent with abnormal left ventricular relaxation (grade 1 diastolic dysfunction). - Mitral valve: Calcified annulus. Mildly thickened leaflets . - Left atrium: The atrium was mildly dilated.  Impressions:  - Inferobasal hypokinesis with overall preserved LV systolic function; mild diastolic dysfunction; MAC without significant MR; mild LAE.  ------------------------------------------------------------------- Labs, prior tests, procedures, and surgery: Echocardiography (March 2012).  ------------------------------------------------------------------- Study data:   Study status:  Routine.  Procedure:  The patient reported no pain pre or post test. Transthoracic echocardiography. Image quality was adequate.  Study completion:  There were no complications.          Transthoracic echocardiography.  M-mode, complete 2D, spectral Doppler, and color Doppler.  Birthdate: Patient birthdate: [birth year].  Age:  Patient is 67 yr old.  Sex: Gender: female.    BMI: 40.8 kg/m^2.  Blood pressure:     136/72 Patient status:  Outpatient.  Study date:  Study date: 01/25/2017. Study time: 11:16 AM.  Location:  [facility]  -------------------------------------------------------------------  ------------------------------------------------------------------- Left ventricle:  The cavity size was normal. Wall thickness was normal. Systolic function was normal. The estimated ejection fraction was in the range of 55% to 60%.  Regional wall motion abnormalities:   There is hypokinesis of the basalinferior myocardium. Doppler parameters are consistent with abnormal left ventricular relaxation (grade 1 diastolic  dysfunction).  ------------------------------------------------------------------- Aortic valve:   Trileaflet; mildly calcified leaflets. Mobility was not restricted.  Doppler:  Transvalvular velocity was within the normal range. There was no stenosis. There was no regurgitation.  ------------------------------------------------------------------- Aorta:  Aortic root: The aortic root was normal in size.  ------------------------------------------------------------------- Mitral valve:   Calcified annulus. Mildly thickened leaflets . Mobility was not restricted.  Doppler:  Transvalvular velocity was within the normal range. There was no evidence for stenosis. There was no regurgitation.    Peak gradient (D): 3 mm Hg.  ------------------------------------------------------------------- Left atrium:  The atrium was mildly dilated.  ------------------------------------------------------------------- Right ventricle:  The **Note De-Identified [place] Obfuscation** cavity size was normal. Systolic function was normal.  ------------------------------------------------------------------- Pulmonic valve:    Doppler:  Transvalvular velocity was within the normal range. There was no evidence for stenosis.  ------------------------------------------------------------------- Tricuspid valve:   Structurally normal valve.    Doppler: Transvalvular velocity was within the normal range. There was trivial regurgitation.  ------------------------------------------------------------------- Right atrium:  The atrium was normal in size.  ------------------------------------------------------------------- Pericardium:  There was no pericardial effusion.  ------------------------------------------------------------------- Measurements  Left ventricle                           Value        Reference LV ID, ED, PLAX chordal        (H)       52.2  mm     43 - 52 LV ID, ES, PLAX chordal        (H)       40.4  mm     23 - 38 LV fx shortening,  PLAX chordal (L)       23    %      >=29 LV PW thickness, ED                      11.9  mm     ---------- IVS/LV PW ratio, ED                      0.84         <=1.3 Stroke volume, 2D                        53    ml     ---------- Stroke volume/bsa, 2D                    23    ml/m^2 ---------- LV e&', lateral                           6.85  cm/s   ---------- LV E/e&', lateral                         12.32        ----------  Ventricular septum                       Value        Reference IVS thickness, ED                        10    mm     ----------  LVOT                                     Value        Reference LVOT ID, S                               21    mm     ---------- LVOT area                                3.46  cm^2   ---------- LVOT **Note De-Identified [place] Obfuscation** peak velocity, S                    85.7  cm/s   ---------- LVOT mean velocity, S                    57    cm/s   ---------- LVOT VTI, S                              15.4  cm     ----------  Aorta                                    Value        Reference Aortic root ID, ED                       29    mm     ----------  Left atrium                              Value        Reference LA ID, A-P, ES                           52    mm     ---------- LA ID/bsa, A-P                 (H)       2.3   cm/m^2 <=2.2 LA volume, S                             71.6  ml     ---------- LA volume/bsa, S                         31.6  ml/m^2 ---------- LA volume, ES, 1-p A4C                   66.7  ml     ---------- LA volume/bsa, ES, 1-p A4C               29.5  ml/m^2 ---------- LA volume, ES, 1-p A2C                   76.5  ml     ---------- LA volume/bsa, ES, 1-p A2C               33.8  ml/m^2 ----------  Mitral valve                             Value        Reference Mitral E-wave peak velocity              84.4  cm/s   ---------- Mitral A-wave peak velocity              87.3  cm/s   ---------- Mitral deceleration time       (H)       232   ms     150 -  230 Mitral peak gradient, D **Note De-Identified [place] Obfuscation** 3     mm Hg  ---------- Mitral E/A ratio, peak                   0.9          ----------  Right atrium                             Value        Reference RA ID, S-I, ES, A4C                      43.9  mm     34 - 49 RA area, ES, A4C                         11.3  cm^2   8.3 - 19.5 RA volume, ES, A/L                       24.6  ml     ---------- RA volume/bsa, ES, A/L                   10.9  ml/m^2 ----------  Right ventricle                          Value        Reference TAPSE                                    29.8  mm     ---------- RV s&', lateral, S                        10.8  cm/s   ----------  Legend: (L)  and  (H)  mark values outside specified reference range.  ------------------------------------------------------------------- Prepared and Electronically Authenticated by  Kirk Ruths 2018-04-27T13:35:43              Recent Labs: 06/05/2022: ALT 25; BUN 17; Hemoglobin 11.4; Platelet Count 78; Potassium 4.6; Sodium 141 10/23/2022: Creatinine, Ser 1.20  Recent Lipid Panel    Component Value Date/Time   CHOL 179 12/31/2016 1445   TRIG 263 (H) 12/31/2016 1445   HDL 38 (L) 12/31/2016 1445   CHOLHDL 4.7 (H) 12/31/2016 1445   CHOLHDL 6 12/06/2015 1132   VLDL 51.2 (H) 05/27/2015 1151   LDLCALC 88 12/31/2016 1445   LDLDIRECT 106 (H) 11/08/2017 1411   LDLDIRECT 127.0 12/06/2015 1132     Risk Assessment/Calculations:   {Does this patient have ATRIAL FIBRILLATION?[phone]}  No BP recorded.  {Refresh Note OR Click here to enter BP  :1}***         Physical Exam:    VS:  There were no vitals taken for this visit.    Wt Readings from Last 3 Encounters:  10/23/22 198 lb (89.8 kg)  09/05/22 192 lb 12.8 oz (87.5 kg)  06/22/22 197 lb 12.8 oz (89.7 kg)     GEN: *** Well nourished, well developed in no acute distress HEENT: Normal NECK: No JVD; No carotid bruits LYMPHATICS: No lymphadenopathy CARDIAC: ***RRR, no  murmurs, rubs, gallops RESPIRATORY:  Clear to auscultation without rales, wheezing or rhonchi  ABDOMEN: Soft, non-tender, non-distended MUSCULOSKELETAL:  No edema; No deformity  SKIN: Warm and dry NEUROLOGIC:  Alert and **Note De-Identified [place] Obfuscation** oriented x 3 PSYCHIATRIC:  Normal affect   ASSESSMENT:    No diagnosis found. PLAN:    Coronary Artery Disease; Obstructive/Nonobstructive HLD - symptomatic on *** with stable/unstable angina on therapy *** - anatomy: *** - continue ASA 81 mg; Continue *** until *** - continue statin, goal LDL < 55 - sending Lp(a) *** - continue BB - continue nitrates; *** PDEi - continue ACEi - discussed cardiac rehab  Active Smoking COPD on home O2 Squamous cell carcinoma with carboplatin based therapy and no echo f/u  HTN with DM OSA      {Are you ordering a CV Procedure (e.g. stress test, cath, DCCV, TEE, etc)?   Press F2        :[id number]}    Medication Adjustments/Labs and Tests Ordered: Current medicines are reviewed at length with the patient today.  Concerns regarding medicines are outlined above.  No orders of the defined types were placed in this encounter.  No orders of the defined types were placed in this encounter.   There are no Patient Instructions on file for this visit.   Signed, Werner Lean, MD  10/29/2022 11:04 AM    [place]

## 2022-10-29 NOTE — Telephone Encounter (Signed)
**Note De-Identified [place] Obfuscation** Pt is calling in to speak with dr [name] nurse

## 2022-10-30 ENCOUNTER — Ambulatory Visit: Payer: Medicare Other | Admitting: Internal Medicine

## 2022-10-30 ENCOUNTER — Encounter: Payer: Self-pay | Admitting: Family Medicine

## 2022-10-30 NOTE — Telephone Encounter (Signed)
**Note De-Identified [place] Obfuscation** She is being treated as a diverticulitis.  Diarrhea is a common symptoms.  Unless she is not able to keep up with fluids, I wouldn't recommend using Imodium.  She wants to get infection out, not keep it in.  She should have Zofran on hand for nausea.  If symptoms are worsening, particularly abdominal pain, I want her to go emergently to ER.  She may need more intensive interventions.

## 2022-11-06 ENCOUNTER — Ambulatory Visit: Payer: Medicare Other | Admitting: Internal Medicine

## 2022-11-06 ENCOUNTER — Other Ambulatory Visit (INDEPENDENT_AMBULATORY_CARE_PROVIDER_SITE_OTHER): Payer: Medicare Other

## 2022-11-06 ENCOUNTER — Encounter: Payer: Self-pay | Admitting: Internal Medicine

## 2022-11-06 VITALS — BP 116/62 | HR 104 | Ht 62.0 in | Wt 190.0 lb

## 2022-11-06 DIAGNOSIS — K7460 Unspecified cirrhosis of liver: Secondary | ICD-10-CM | POA: Diagnosis not present

## 2022-11-06 DIAGNOSIS — R112 Nausea with vomiting, unspecified: Secondary | ICD-10-CM | POA: Diagnosis not present

## 2022-11-06 DIAGNOSIS — R197 Diarrhea, unspecified: Secondary | ICD-10-CM

## 2022-11-06 DIAGNOSIS — R1031 Right lower quadrant pain: Secondary | ICD-10-CM

## 2022-11-06 DIAGNOSIS — R188 Other ascites: Secondary | ICD-10-CM

## 2022-11-06 LAB — COMPREHENSIVE METABOLIC PANEL
ALT: 12 U/L (ref 0–35)
AST: 24 U/L (ref 0–37)
Albumin: 3 g/dL — ABNORMAL LOW (ref 3.5–5.2)
Alkaline Phosphatase: 136 U/L — ABNORMAL HIGH (ref 39–117)
BUN: 39 mg/dL — ABNORMAL HIGH (ref 6–23)
CO2: 24 mEq/L (ref 19–32)
Calcium: 8.1 mg/dL — ABNORMAL LOW (ref 8.4–10.5)
Chloride: 106 mEq/L (ref 96–112)
Creatinine, Ser: 1.96 mg/dL — ABNORMAL HIGH (ref 0.40–1.20)
GFR: 25.77 mL/min — ABNORMAL LOW (ref >60.00)
Glucose, Bld: 100 mg/dL — ABNORMAL HIGH (ref 70–99)
Potassium: 4.3 mEq/L (ref 3.5–5.1)
Sodium: 139 mEq/L (ref 135–145)
Total Bilirubin: 0.7 mg/dL (ref 0.2–1.2)
Total Protein: 7.1 g/dL (ref 6.0–8.3)

## 2022-11-06 LAB — CBC
HCT: 27.4 % — ABNORMAL LOW (ref 36.0–46.0)
Hemoglobin: 8.9 g/dL — ABNORMAL LOW (ref 12.0–15.0)
MCHC: 32.7 g/dL (ref 30.0–36.0)
MCV: 100.7 fl — ABNORMAL HIGH (ref 78.0–100.0)
Platelets: 90 10*3/uL — ABNORMAL LOW (ref 150.0–400.0)
RBC: 2.72 Mil/uL — ABNORMAL LOW (ref 3.87–5.11)
RDW: 16.5 % — ABNORMAL HIGH (ref 11.5–15.5)
WBC: 5.8 10*3/uL (ref 4.0–10.5)

## 2022-11-06 NOTE — Patient Instructions (Signed)
**Note De-Identified [place] Obfuscation** Your provider has requested that you go to the basement level for lab work before leaving today. Press "B" on the elevator. The lab is located at the first door on the left as you exit the elevator.  You have been scheduled for an abdominal paracentesis at [facility]  radiology (Entrance C of  the hospital) on 11/08/22 at 1:00 pm . Please arrive at least 30 minutes prior to your appointment time for registration. Should you need to reschedule this appointment for any reason, please call our office at [phone]  Follow up on : 12/13/22 at 3:00 pm with Dr. Henrene Pastor   Due to recent changes in healthcare laws, you may see the results of your imaging and laboratory studies on MyChart before your provider has had a chance to review them.  We understand that in some cases there may be results that are confusing or concerning to you. Not all laboratory results come back in the same time frame and the provider may be waiting for multiple results in order to interpret others.  Please give [place] 48 hours in order for your provider to thoroughly review all the results before contacting the office for clarification of your results.   _______________________________________________________  If your blood pressure at your visit was 140/90 or greater, please contact your primary care physician to follow up on this.  _______________________________________________________  If you are age 71 or older, your body mass index should be between 23-30. Your Body mass index is 34.75 kg/m. If this is out of the aforementioned range listed, please consider follow up with your Primary Care Provider.  If you are age 52 or younger, your body mass index should be between 19-25. Your Body mass index is 34.75 kg/m. If this is out of the aformentioned range listed, please consider follow up with your Primary Care Provider.   ________________________________________________________  The [facility] providers would like to encourage you  to use [facility] to communicate with providers for non-urgent requests or questions.  Due to long hold times on the telephone, sending your provider a message by [facility] may be a faster and more efficient way to get a response.  Please allow 48 business hours for a response.  Please remember that this is for non-urgent requests.  _______________________________________________________  Thank you for choosing me and [facility].  Dr. Scarlette Shorts

## 2022-11-06 NOTE — Progress Notes (Signed)
**Note De-Identified [facility]enn[facility] Obfuscation** HISTORY OF PRESENT ILLNESS:  Hailey Hernandez is a 67 y.o. female with MULTIPLE SIGNIFICANT medical problems including, but not limited to, end-stage COPD on chronic oxygen therapy, lung cancer, hepatic cirrhosis with portal hypertension, obesity, asthma, and multiple prior surgeries.  I have not seen the patient since 2012 when she underwent colonoscopy and upper endoscopy.  Colonoscopy revealed left-sided diverticulosis and hyperplastic polyps.  Follow-up in 10 years recommended.  Upper endoscopy revealed a distal esophageal ring which was dilated.  The exam was otherwise normal.  She was seen back once in this office regarding colon cancer screening follow-up.  This visit occurred October 2022.  At that time she was not deemed appropriate for endoscopy.  She is sent today by her primary care provider regarding myriad of GI complaints.  She is accompanied by her husband.  Patient states that earlier this month she had the flu.  Felt that.  Was treated with antibiotics, several courses.  Since that time complaints have been intermittent nausea with vomiting, frequent diarrhea, and abdominal pain, particularly on the right.  She was seen by her primary provider, Dr. [name], October 23, 2022.  Reviewed.  Blood work was creatinine of 1.20.  Last hemoglobin from September was 11.4.  She did undergo a contrast-enhanced CT scan of the abdomen and pelvis October 23, 2022.  She was found to have cirrhosis with splenomegaly, moderate ascites, and upper abdominal collaterals compatible with portal venous hypertension.  There was mild wall thickening of the proximal jejunum with some air-fluid levels.  Also possible thickening of the ascending and transverse colon.  Multiple other abnormalities noted.  She is sent here today for assessment.  She denies hematemesis or melena.  REVIEW OF SYSTEMS:  All non-GI ROS negative unless otherwise stated in the HPI except for allergies, anxiety, arthritis, back pain, cough,  fatigue, fever, headaches, hearing problems, heart rhythm change, muscle cramps, sleeping problems, excessive thirst, shortness of breath.  Past Medical History:  Diagnosis Date   Allergy    Anxiety disorder    Arthritis    bil legs   Asthma    Cancer ([place])    Chronic bronchitis    Cirrhosis (HCC)    Complication of anesthesia    slow to wake up, one time she turned blue in recovery   COPD (chronic obstructive pulmonary disease) (HCC)    Depression    Diverticulitis    DJD (degenerative joint disease)    spine   Dyspnea    O2 at 3 L 24/7   Fibromyalgia    GERD (gastroesophageal reflux disease)    Glaucoma    History of thyroid cancer    HTN (hypertension)    Hyperlipidemia    Impaired glucose tolerance 05/08/2013   Lung cancer (HCC)    OSA (obstructive sleep apnea)    CPAP  last sleep study 8-10 yr.ag0   Polymyalgia ([place])    Pre-diabetes    Psoriasis    Rhinitis    Tobacco abuse     Past Surgical History:  Procedure Laterality Date   APPENDECTOMY     BRONCHIAL BIOPSY  01/24/2021   Procedure: BRONCHIAL BIOPSIES;  Surgeon: Garner Nash, DO;  Location: [facility];  Service: Pulmonary;;   BRONCHIAL BRUSHINGS  01/24/2021   Procedure: BRONCHIAL BRUSHINGS;  Surgeon: Garner Nash, DO;  Location: [facility];  Service: Pulmonary;;   BRONCHIAL NEEDLE ASPIRATION BIOPSY  01/24/2021   Procedure: BRONCHIAL NEEDLE ASPIRATION BIOPSIES;  Surgeon: Garner Nash, DO;  Location: [facility] **Note De-Identified [facility] Obfuscation** ENDOSCOPY;  Service: Pulmonary;;   CARPAL TUNNEL RELEASE  10/30/2011   Procedure: CARPAL TUNNEL RELEASE;  Surgeon: Wynonia Sours, MD;  Location: [facility];  Service: Orthopedics;  Laterality: Right;  and mass excision   CHOLECYSTECTOMY     CHOLESTEATOMA EXCISION     right ear   left shoulder spurs     ORIF right lower leg     partial throidectomy     TOTAL ABDOMINAL HYSTERECTOMY     VIDEO BRONCHOSCOPY WITH ENDOBRONCHIAL NAVIGATION N/A 01/24/2021   Procedure: VIDEO BRONCHOSCOPY  WITH ENDOBRONCHIAL NAVIGATION;  Surgeon: Garner Nash, DO;  Location: [facility];  Service: Pulmonary;  Laterality: N/A;   VIDEO BRONCHOSCOPY WITH ENDOBRONCHIAL ULTRASOUND N/A 01/24/2021   Procedure: VIDEO BRONCHOSCOPY WITH ENDOBRONCHIAL ULTRASOUND;  Surgeon: Garner Nash, DO;  Location: [facility];  Service: Pulmonary;  Laterality: N/A;    Social History Hailey Hernandez  reports that she has been smoking cigarettes. She has a 45.00 pack-year smoking history. She has never used smokeless tobacco. She reports that she does not drink alcohol and does not use drugs.  family history includes Allergies in her sister; Alpha-1 antitrypsin deficiency in her sister and sister; Asthma in her sister; Cancer in her brother, mother, and sister; Emphysema in her sister and sister; Heart attack in her father, mother, and sister; Heart disease in her father and mother; Hyperlipidemia in her brother, daughter, and mother; Hypertension in her brother, daughter, mother, sister, and son; Tuberculosis in an other family member.  Allergies  Allergen Reactions   Doxycycline Shortness Of Breath and Other (See Comments)    Drug interaction with Soriatane   Tramadol Shortness Of Breath    Did not work   Milnacipran     REACTION: dizzy   Apremilast Rash    Rash, able to take this currently       PHYSICAL EXAMINATION: Vital signs: BP 116/62   Pulse (!) 104   Ht 5\' 2"  (1.575 m)   Wt 190 lb (86.2 kg)   BMI 34.75 kg/m   Constitutional: Chronically ill-appearing, fatigued, in wheelchair, no acute distress.  Nasal cannula oxygen in place. Psychiatric: alert and oriented x3, cooperative Eyes: extraocular movements intact, anicteric, conjunctiva pink Mouth: oral pharynx dry, no lesions Neck: supple no lymphadenopathy Cardiovascular: heart regular rate and rhythm, no obvious murmur Lungs: clear to auscultation bilaterally though breath sounds distant and dullness at bases Abdomen: soft, obese with some  tenderness in the right mid flank region, distended with likely ascites, no peritoneal signs, normal bowel sounds, no organomegaly.  Caput medusa Rectal: Omitted Extremities: no clubbing or cyanosis.  1+ lower extremity edema bilaterally with stasis changes Skin: no lesions on visible extremities Neuro: No focal deficits. No asterixis.   ASSESSMENT:  1.  Hepatic cirrhosis with decompensation in the form of ascites.  I suspect this is responsible for her abdominal discomfort as she describes today. 2.  Hepatic cirrhosis likely secondary to NASH 3.  Advanced lung disease and lung cancer on chronic oxygen therapy 4.  Intermittent nausea and vomiting.  Likely due to underlying medical issues as described 5.  Diarrhea after treat with antibiotics.  Rule out C. difficile associated diarrhea 6.  Multiple other medical problems.  Unwell patient   PLAN:  1.  Ultrasound-guided paracentesis up to 5 L 2.  IV albumin replacement per protocol 3.  Send fluid for cell count with differential and cytology 4.  Blood work today including CBC and comprehensive metabolic panel 5. **Note De-Identified [facility]ara Obfuscation** Stool studies for C. difficile 6.  Office follow-up in 3 to 4 weeks. A total time of 65 minutes was spent preparing to see the patient, reviewing the myriad of records, laboratories, and x-rays.  Obtaining comprehensive history, performing medically appropriate physical examination, counseling the patient and her husband regarding the above listed issues, ordering therapeutic radiology procedure, medication, blood work, stool studies, and follow-up.  Finally, documenting clinical information in the health record

## 2022-11-07 ENCOUNTER — Other Ambulatory Visit: Payer: Medicare Other

## 2022-11-07 DIAGNOSIS — R197 Diarrhea, unspecified: Secondary | ICD-10-CM

## 2022-11-08 ENCOUNTER — Ambulatory Visit (HOSPITAL_COMMUNITY)
Admission: RE | Admit: 2022-11-08 | Discharge: 2022-11-08 | Disposition: A | Discharge status: Home / Self Care | Payer: Medicare Other | Source: Ambulatory Visit | Attending: Internal Medicine | Admitting: Internal Medicine

## 2022-11-08 DIAGNOSIS — N179 Acute kidney failure, unspecified: Secondary | ICD-10-CM | POA: Diagnosis not present

## 2022-11-08 DIAGNOSIS — J9602 Acute respiratory failure with hypercapnia: Secondary | ICD-10-CM | POA: Diagnosis not present

## 2022-11-08 DIAGNOSIS — J441 Chronic obstructive pulmonary disease with (acute) exacerbation: Secondary | ICD-10-CM | POA: Diagnosis not present

## 2022-11-08 DIAGNOSIS — F32A Depression, unspecified: Secondary | ICD-10-CM | POA: Diagnosis not present

## 2022-11-08 DIAGNOSIS — I10 Essential (primary) hypertension: Secondary | ICD-10-CM | POA: Diagnosis not present

## 2022-11-08 DIAGNOSIS — J9601 Acute respiratory failure with hypoxia: Secondary | ICD-10-CM | POA: Diagnosis not present

## 2022-11-08 DIAGNOSIS — R519 Headache, unspecified: Secondary | ICD-10-CM | POA: Diagnosis not present

## 2022-11-08 DIAGNOSIS — Z1152 Encounter for screening for COVID-19: Secondary | ICD-10-CM | POA: Diagnosis not present

## 2022-11-08 DIAGNOSIS — L89153 Pressure ulcer of sacral region, stage 3: Secondary | ICD-10-CM | POA: Diagnosis not present

## 2022-11-08 DIAGNOSIS — K7460 Unspecified cirrhosis of liver: Secondary | ICD-10-CM | POA: Insufficient documentation

## 2022-11-08 DIAGNOSIS — E870 Hyperosmolality and hypernatremia: Secondary | ICD-10-CM | POA: Diagnosis not present

## 2022-11-08 DIAGNOSIS — T85511A Breakdown (mechanical) of esophageal anti-reflux device, initial encounter: Secondary | ICD-10-CM | POA: Diagnosis not present

## 2022-11-08 DIAGNOSIS — D539 Nutritional anemia, unspecified: Secondary | ICD-10-CM | POA: Diagnosis not present

## 2022-11-08 DIAGNOSIS — I6381 Other cerebral infarction due to occlusion or stenosis of small artery: Secondary | ICD-10-CM | POA: Diagnosis not present

## 2022-11-08 DIAGNOSIS — I2781 Cor pulmonale (chronic): Secondary | ICD-10-CM | POA: Diagnosis not present

## 2022-11-08 DIAGNOSIS — N189 Chronic kidney disease, unspecified: Secondary | ICD-10-CM | POA: Diagnosis not present

## 2022-11-08 DIAGNOSIS — E440 Moderate protein-calorie malnutrition: Secondary | ICD-10-CM | POA: Diagnosis not present

## 2022-11-08 DIAGNOSIS — I214 Non-ST elevation (NSTEMI) myocardial infarction: Secondary | ICD-10-CM | POA: Diagnosis not present

## 2022-11-08 DIAGNOSIS — R1031 Right lower quadrant pain: Secondary | ICD-10-CM | POA: Insufficient documentation

## 2022-11-08 DIAGNOSIS — R188 Other ascites: Secondary | ICD-10-CM | POA: Insufficient documentation

## 2022-11-08 DIAGNOSIS — I2721 Secondary pulmonary arterial hypertension: Secondary | ICD-10-CM | POA: Diagnosis not present

## 2022-11-08 DIAGNOSIS — G9340 Encephalopathy, unspecified: Secondary | ICD-10-CM | POA: Diagnosis not present

## 2022-11-08 DIAGNOSIS — Z66 Do not resuscitate: Secondary | ICD-10-CM | POA: Diagnosis not present

## 2022-11-08 DIAGNOSIS — E874 Mixed disorder of acid-base balance: Secondary | ICD-10-CM | POA: Diagnosis not present

## 2022-11-08 DIAGNOSIS — I21A1 Myocardial infarction type 2: Secondary | ICD-10-CM | POA: Diagnosis not present

## 2022-11-08 DIAGNOSIS — J90 Pleural effusion, not elsewhere classified: Secondary | ICD-10-CM | POA: Diagnosis not present

## 2022-11-08 DIAGNOSIS — D6959 Other secondary thrombocytopenia: Secondary | ICD-10-CM | POA: Diagnosis not present

## 2022-11-08 DIAGNOSIS — Z7189 Other specified counseling: Secondary | ICD-10-CM | POA: Diagnosis not present

## 2022-11-08 DIAGNOSIS — K5730 Diverticulosis of large intestine without perforation or abscess without bleeding: Secondary | ICD-10-CM | POA: Diagnosis not present

## 2022-11-08 DIAGNOSIS — M353 Polymyalgia rheumatica: Secondary | ICD-10-CM | POA: Diagnosis not present

## 2022-11-08 DIAGNOSIS — L400 Psoriasis vulgaris: Secondary | ICD-10-CM | POA: Diagnosis not present

## 2022-11-08 DIAGNOSIS — R579 Shock, unspecified: Secondary | ICD-10-CM | POA: Diagnosis not present

## 2022-11-08 DIAGNOSIS — N1832 Chronic kidney disease, stage 3b: Secondary | ICD-10-CM | POA: Diagnosis not present

## 2022-11-08 DIAGNOSIS — R918 Other nonspecific abnormal finding of lung field: Secondary | ICD-10-CM | POA: Diagnosis not present

## 2022-11-08 DIAGNOSIS — G9341 Metabolic encephalopathy: Secondary | ICD-10-CM | POA: Diagnosis not present

## 2022-11-08 DIAGNOSIS — K766 Portal hypertension: Secondary | ICD-10-CM | POA: Diagnosis not present

## 2022-11-08 DIAGNOSIS — I959 Hypotension, unspecified: Secondary | ICD-10-CM | POA: Diagnosis not present

## 2022-11-08 DIAGNOSIS — Z4682 Encounter for fitting and adjustment of non-vascular catheter: Secondary | ICD-10-CM | POA: Diagnosis not present

## 2022-11-08 DIAGNOSIS — R0902 Hypoxemia: Secondary | ICD-10-CM | POA: Diagnosis not present

## 2022-11-08 DIAGNOSIS — Z515 Encounter for palliative care: Secondary | ICD-10-CM | POA: Diagnosis not present

## 2022-11-08 DIAGNOSIS — J9621 Acute and chronic respiratory failure with hypoxia: Secondary | ICD-10-CM | POA: Diagnosis not present

## 2022-11-08 DIAGNOSIS — I509 Heart failure, unspecified: Secondary | ICD-10-CM | POA: Diagnosis not present

## 2022-11-08 DIAGNOSIS — J690 Pneumonitis due to inhalation of food and vomit: Secondary | ICD-10-CM | POA: Diagnosis not present

## 2022-11-08 DIAGNOSIS — I129 Hypertensive chronic kidney disease with stage 1 through stage 4 chronic kidney disease, or unspecified chronic kidney disease: Secondary | ICD-10-CM | POA: Diagnosis not present

## 2022-11-08 DIAGNOSIS — R110 Nausea: Secondary | ICD-10-CM | POA: Diagnosis not present

## 2022-11-08 DIAGNOSIS — F05 Delirium due to known physiological condition: Secondary | ICD-10-CM | POA: Diagnosis not present

## 2022-11-08 DIAGNOSIS — J9622 Acute and chronic respiratory failure with hypercapnia: Secondary | ICD-10-CM | POA: Diagnosis not present

## 2022-11-08 DIAGNOSIS — Z9911 Dependence on respirator [ventilator] status: Secondary | ICD-10-CM | POA: Diagnosis not present

## 2022-11-08 DIAGNOSIS — J449 Chronic obstructive pulmonary disease, unspecified: Secondary | ICD-10-CM | POA: Diagnosis not present

## 2022-11-08 DIAGNOSIS — J9690 Respiratory failure, unspecified, unspecified whether with hypoxia or hypercapnia: Secondary | ICD-10-CM | POA: Diagnosis not present

## 2022-11-08 DIAGNOSIS — G4733 Obstructive sleep apnea (adult) (pediatric): Secondary | ICD-10-CM | POA: Diagnosis not present

## 2022-11-08 HISTORY — PX: IR PARACENTESIS: IMG2679

## 2022-11-08 LAB — BODY FLUID CELL COUNT WITH DIFFERENTIAL
Lymphs, Fluid: 68 %
Monocyte-Macrophage-Serous Fluid: 19 % — ABNORMAL LOW (ref 50–90)
Neutrophil Count, Fluid: 13 % (ref 0–25)
Total Nucleated Cell Count, Fluid: 109 cu mm (ref 0–1000)

## 2022-11-08 MED ORDER — LIDOCAINE HCL 1 % IJ SOLN
INTRAMUSCULAR | Status: AC
  Filled 2022-11-08: qty 20

## 2022-11-08 NOTE — Procedures (Signed)
**Note De-Identified [place] Obfuscation** PROCEDURE SUMMARY:  Successful US guided diagnostic and therapeutic paracentesis from RLQ.  Yielded 3.6 L of clear, pale yellow fluid.  No immediate complications.  Pt tolerated well.   Specimen was sent for labs.  EBL < 1 mL  Tyson Alias, AGNP 11/08/2022 2:20 PM

## 2022-11-09 ENCOUNTER — Encounter (HOSPITAL_COMMUNITY): Payer: Self-pay

## 2022-11-09 ENCOUNTER — Emergency Department (HOSPITAL_COMMUNITY): Payer: Medicare Other

## 2022-11-09 ENCOUNTER — Other Ambulatory Visit: Payer: Self-pay

## 2022-11-09 ENCOUNTER — Other Ambulatory Visit (HOSPITAL_COMMUNITY): Payer: Self-pay

## 2022-11-09 DIAGNOSIS — N189 Chronic kidney disease, unspecified: Secondary | ICD-10-CM

## 2022-11-09 DIAGNOSIS — I129 Hypertensive chronic kidney disease with stage 1 through stage 4 chronic kidney disease, or unspecified chronic kidney disease: Secondary | ICD-10-CM | POA: Diagnosis present

## 2022-11-09 DIAGNOSIS — E440 Moderate protein-calorie malnutrition: Secondary | ICD-10-CM | POA: Diagnosis present

## 2022-11-09 DIAGNOSIS — E785 Hyperlipidemia, unspecified: Secondary | ICD-10-CM | POA: Diagnosis present

## 2022-11-09 DIAGNOSIS — K7210 Chronic hepatic failure without coma: Secondary | ICD-10-CM | POA: Diagnosis present

## 2022-11-09 DIAGNOSIS — R54 Age-related physical debility: Secondary | ICD-10-CM | POA: Diagnosis present

## 2022-11-09 DIAGNOSIS — I21A1 Myocardial infarction type 2: Principal | ICD-10-CM | POA: Diagnosis present

## 2022-11-09 DIAGNOSIS — E872 Acidosis, unspecified: Secondary | ICD-10-CM | POA: Diagnosis present

## 2022-11-09 DIAGNOSIS — Z1152 Encounter for screening for COVID-19: Secondary | ICD-10-CM

## 2022-11-09 DIAGNOSIS — H409 Unspecified glaucoma: Secondary | ICD-10-CM | POA: Diagnosis present

## 2022-11-09 DIAGNOSIS — E870 Hyperosmolality and hypernatremia: Secondary | ICD-10-CM | POA: Diagnosis not present

## 2022-11-09 DIAGNOSIS — Z90710 Acquired absence of both cervix and uterus: Secondary | ICD-10-CM

## 2022-11-09 DIAGNOSIS — M797 Fibromyalgia: Secondary | ICD-10-CM | POA: Diagnosis present

## 2022-11-09 DIAGNOSIS — I872 Venous insufficiency (chronic) (peripheral): Secondary | ICD-10-CM | POA: Diagnosis present

## 2022-11-09 DIAGNOSIS — R579 Shock, unspecified: Principal | ICD-10-CM | POA: Diagnosis present

## 2022-11-09 DIAGNOSIS — Z66 Do not resuscitate: Secondary | ICD-10-CM | POA: Diagnosis not present

## 2022-11-09 DIAGNOSIS — N1832 Chronic kidney disease, stage 3b: Secondary | ICD-10-CM | POA: Diagnosis present

## 2022-11-09 DIAGNOSIS — Z9221 Personal history of antineoplastic chemotherapy: Secondary | ICD-10-CM

## 2022-11-09 DIAGNOSIS — M1990 Unspecified osteoarthritis, unspecified site: Secondary | ICD-10-CM | POA: Diagnosis present

## 2022-11-09 DIAGNOSIS — I2721 Secondary pulmonary arterial hypertension: Secondary | ICD-10-CM | POA: Diagnosis present

## 2022-11-09 DIAGNOSIS — Z7982 Long term (current) use of aspirin: Secondary | ICD-10-CM

## 2022-11-09 DIAGNOSIS — F05 Delirium due to known physiological condition: Secondary | ICD-10-CM | POA: Diagnosis not present

## 2022-11-09 DIAGNOSIS — Z885 Allergy status to narcotic agent status: Secondary | ICD-10-CM

## 2022-11-09 DIAGNOSIS — F419 Anxiety disorder, unspecified: Secondary | ICD-10-CM | POA: Diagnosis present

## 2022-11-09 DIAGNOSIS — Z515 Encounter for palliative care: Secondary | ICD-10-CM

## 2022-11-09 DIAGNOSIS — Z923 Personal history of irradiation: Secondary | ICD-10-CM

## 2022-11-09 DIAGNOSIS — B9689 Other specified bacterial agents as the cause of diseases classified elsewhere: Secondary | ICD-10-CM | POA: Diagnosis present

## 2022-11-09 DIAGNOSIS — B957 Other staphylococcus as the cause of diseases classified elsewhere: Secondary | ICD-10-CM | POA: Diagnosis present

## 2022-11-09 DIAGNOSIS — Z8249 Family history of ischemic heart disease and other diseases of the circulatory system: Secondary | ICD-10-CM

## 2022-11-09 DIAGNOSIS — K7460 Unspecified cirrhosis of liver: Secondary | ICD-10-CM | POA: Diagnosis present

## 2022-11-09 DIAGNOSIS — L89153 Pressure ulcer of sacral region, stage 3: Secondary | ICD-10-CM | POA: Diagnosis present

## 2022-11-09 DIAGNOSIS — L7632 Postprocedural hematoma of skin and subcutaneous tissue following other procedure: Secondary | ICD-10-CM | POA: Diagnosis present

## 2022-11-09 DIAGNOSIS — K7581 Nonalcoholic steatohepatitis (NASH): Secondary | ICD-10-CM | POA: Diagnosis present

## 2022-11-09 DIAGNOSIS — I2510 Atherosclerotic heart disease of native coronary artery without angina pectoris: Secondary | ICD-10-CM | POA: Diagnosis present

## 2022-11-09 DIAGNOSIS — Z83438 Family history of other disorder of lipoprotein metabolism and other lipidemia: Secondary | ICD-10-CM

## 2022-11-09 DIAGNOSIS — J690 Pneumonitis due to inhalation of food and vomit: Secondary | ICD-10-CM | POA: Diagnosis not present

## 2022-11-09 DIAGNOSIS — K219 Gastro-esophageal reflux disease without esophagitis: Secondary | ICD-10-CM | POA: Diagnosis present

## 2022-11-09 DIAGNOSIS — J9622 Acute and chronic respiratory failure with hypercapnia: Secondary | ICD-10-CM | POA: Diagnosis not present

## 2022-11-09 DIAGNOSIS — F17210 Nicotine dependence, cigarettes, uncomplicated: Secondary | ICD-10-CM | POA: Diagnosis present

## 2022-11-09 DIAGNOSIS — D6959 Other secondary thrombocytopenia: Secondary | ICD-10-CM | POA: Diagnosis present

## 2022-11-09 DIAGNOSIS — E861 Hypovolemia: Secondary | ICD-10-CM | POA: Diagnosis present

## 2022-11-09 DIAGNOSIS — Z825 Family history of asthma and other chronic lower respiratory diseases: Secondary | ICD-10-CM

## 2022-11-09 DIAGNOSIS — R188 Other ascites: Secondary | ICD-10-CM | POA: Diagnosis present

## 2022-11-09 DIAGNOSIS — D539 Nutritional anemia, unspecified: Secondary | ICD-10-CM | POA: Diagnosis present

## 2022-11-09 DIAGNOSIS — J9601 Acute respiratory failure with hypoxia: Secondary | ICD-10-CM

## 2022-11-09 DIAGNOSIS — G9341 Metabolic encephalopathy: Secondary | ICD-10-CM | POA: Diagnosis present

## 2022-11-09 DIAGNOSIS — K766 Portal hypertension: Secondary | ICD-10-CM | POA: Diagnosis present

## 2022-11-09 DIAGNOSIS — Z9981 Dependence on supplemental oxygen: Secondary | ICD-10-CM

## 2022-11-09 DIAGNOSIS — Z9049 Acquired absence of other specified parts of digestive tract: Secondary | ICD-10-CM

## 2022-11-09 DIAGNOSIS — I959 Hypotension, unspecified: Secondary | ICD-10-CM | POA: Diagnosis present

## 2022-11-09 DIAGNOSIS — R739 Hyperglycemia, unspecified: Secondary | ICD-10-CM | POA: Diagnosis not present

## 2022-11-09 DIAGNOSIS — Z7951 Long term (current) use of inhaled steroids: Secondary | ICD-10-CM

## 2022-11-09 DIAGNOSIS — Z781 Physical restraint status: Secondary | ICD-10-CM

## 2022-11-09 DIAGNOSIS — E876 Hypokalemia: Secondary | ICD-10-CM | POA: Diagnosis not present

## 2022-11-09 DIAGNOSIS — J9621 Acute and chronic respiratory failure with hypoxia: Secondary | ICD-10-CM | POA: Diagnosis present

## 2022-11-09 DIAGNOSIS — Z881 Allergy status to other antibiotic agents status: Secondary | ICD-10-CM

## 2022-11-09 DIAGNOSIS — F32A Depression, unspecified: Secondary | ICD-10-CM | POA: Diagnosis present

## 2022-11-09 DIAGNOSIS — G4733 Obstructive sleep apnea (adult) (pediatric): Secondary | ICD-10-CM | POA: Diagnosis present

## 2022-11-09 DIAGNOSIS — I2781 Cor pulmonale (chronic): Secondary | ICD-10-CM | POA: Diagnosis present

## 2022-11-09 DIAGNOSIS — Z79899 Other long term (current) drug therapy: Secondary | ICD-10-CM

## 2022-11-09 DIAGNOSIS — R627 Adult failure to thrive: Secondary | ICD-10-CM | POA: Diagnosis present

## 2022-11-09 DIAGNOSIS — G9340 Encephalopathy, unspecified: Secondary | ICD-10-CM

## 2022-11-09 DIAGNOSIS — R7303 Prediabetes: Secondary | ICD-10-CM | POA: Diagnosis present

## 2022-11-09 DIAGNOSIS — E874 Mixed disorder of acid-base balance: Secondary | ICD-10-CM | POA: Diagnosis present

## 2022-11-09 DIAGNOSIS — Z888 Allergy status to other drugs, medicaments and biological substances status: Secondary | ICD-10-CM

## 2022-11-09 DIAGNOSIS — N179 Acute kidney failure, unspecified: Secondary | ICD-10-CM | POA: Diagnosis present

## 2022-11-09 DIAGNOSIS — J4489 Other specified chronic obstructive pulmonary disease: Secondary | ICD-10-CM | POA: Diagnosis present

## 2022-11-09 DIAGNOSIS — M353 Polymyalgia rheumatica: Secondary | ICD-10-CM | POA: Diagnosis present

## 2022-11-09 DIAGNOSIS — I214 Non-ST elevation (NSTEMI) myocardial infarction: Principal | ICD-10-CM

## 2022-11-09 DIAGNOSIS — Z85118 Personal history of other malignant neoplasm of bronchus and lung: Secondary | ICD-10-CM

## 2022-11-09 DIAGNOSIS — Z85850 Personal history of malignant neoplasm of thyroid: Secondary | ICD-10-CM

## 2022-11-09 LAB — CBC WITH DIFFERENTIAL/PLATELET
Abs Immature Granulocytes: 0.04 10*3/uL (ref 0.00–0.07)
Basophils Absolute: 0 10*3/uL (ref 0.0–0.1)
Basophils Relative: 0 %
Eosinophils Absolute: 0 10*3/uL (ref 0.0–0.5)
Eosinophils Relative: 0 %
HCT: 31.4 % — ABNORMAL LOW (ref 36.0–46.0)
Hemoglobin: 9.2 g/dL — ABNORMAL LOW (ref 12.0–15.0)
Immature Granulocytes: 1 %
Lymphocytes Relative: 12 %
Lymphs Abs: 0.9 10*3/uL (ref 0.7–4.0)
MCH: 32.3 pg (ref 26.0–34.0)
MCHC: 29.3 g/dL — ABNORMAL LOW (ref 30.0–36.0)
MCV: 110.2 fL — ABNORMAL HIGH (ref 80.0–100.0)
Monocytes Absolute: 0.5 10*3/uL (ref 0.1–1.0)
Monocytes Relative: 7 %
Neutro Abs: 6.1 10*3/uL (ref 1.7–7.7)
Neutrophils Relative %: 80 %
Platelets: 145 10*3/uL — ABNORMAL LOW (ref 150–400)
RBC: 2.85 MIL/uL — ABNORMAL LOW (ref 3.87–5.11)
RDW: 16.2 % — ABNORMAL HIGH (ref 11.5–15.5)
WBC: 7.6 10*3/uL (ref 4.0–10.5)
nRBC: 0 % (ref 0.0–0.2)

## 2022-11-09 LAB — COMPREHENSIVE METABOLIC PANEL
ALT: 18 U/L (ref 0–44)
AST: 35 U/L (ref 15–41)
Albumin: 3 g/dL — ABNORMAL LOW (ref 3.5–5.0)
Alkaline Phosphatase: 148 U/L — ABNORMAL HIGH (ref 38–126)
Anion gap: 9 (ref 5–15)
BUN: 64 mg/dL — ABNORMAL HIGH (ref 8–23)
CO2: 23 mmol/L (ref 22–32)
Calcium: 8 mg/dL — ABNORMAL LOW (ref 8.9–10.3)
Chloride: 107 mmol/L (ref 98–111)
Creatinine, Ser: 3.84 mg/dL — ABNORMAL HIGH (ref 0.44–1.00)
GFR, Estimated: 12 mL/min — ABNORMAL LOW (ref >60)
Glucose, Bld: 104 mg/dL — ABNORMAL HIGH (ref 70–99)
Potassium: 4.5 mmol/L (ref 3.5–5.1)
Sodium: 139 mmol/L (ref 135–145)
Total Bilirubin: 0.8 mg/dL (ref 0.3–1.2)
Total Protein: 7.9 g/dL (ref 6.5–8.1)

## 2022-11-09 LAB — CLOSTRIDIUM DIFFICILE TOXIN B, QUALITATIVE, REAL-TIME PCR: Toxigenic C. Difficile by PCR: NOT DETECTED

## 2022-11-09 LAB — RESP PANEL BY RT-PCR (RSV, FLU A&B, COVID)  RVPGX2
Influenza A by PCR: NEGATIVE
Influenza B by PCR: NEGATIVE
Resp Syncytial Virus by PCR: NEGATIVE
SARS Coronavirus 2 by RT PCR: NEGATIVE

## 2022-11-09 LAB — TROPONIN I (HIGH SENSITIVITY): Troponin I (High Sensitivity): 2474 ng/L (ref <18)

## 2022-11-09 LAB — PROTIME-INR
INR: 1.2 (ref 0.8–1.2)
Prothrombin Time: 15.4 seconds — ABNORMAL HIGH (ref 11.4–15.2)

## 2022-11-09 LAB — AMMONIA: Ammonia: 17 umol/L (ref 9–35)

## 2022-11-09 LAB — LACTIC ACID, PLASMA: Lactic Acid, Venous: 1.2 mmol/L (ref 0.5–1.9)

## 2022-11-09 MED ORDER — HEPARIN (PORCINE) 25000 UT/250ML-% IV SOLN
2400.0000 [IU]/h | INTRAVENOUS | Status: DC
  Administered 2022-11-09: 650 [IU]/h via INTRAVENOUS
  Administered 2022-11-10: 1150 [IU]/h via INTRAVENOUS
  Administered 2022-11-11: 1600 [IU]/h via INTRAVENOUS
  Administered 2022-11-12: 1850 [IU]/h via INTRAVENOUS
  Administered 2022-11-12: 2200 [IU]/h via INTRAVENOUS
  Administered 2022-11-13: 2400 [IU]/h via INTRAVENOUS
  Filled 2022-11-09 (×6): qty 250

## 2022-11-09 MED ORDER — SODIUM CHLORIDE 0.9 % IV BOLUS
1000.0000 mL | Freq: Once | INTRAVENOUS | Status: AC
  Administered 2022-11-09: 1000 mL via INTRAVENOUS

## 2022-11-09 MED ORDER — ALBUMIN HUMAN 25 % IV SOLN
50.0000 g | Freq: Once | INTRAVENOUS | Status: AC
  Administered 2022-11-09: 50 g via INTRAVENOUS
  Filled 2022-11-09: qty 200

## 2022-11-09 MED ORDER — NOREPINEPHRINE 4 MG/250ML-% IV SOLN
2.0000 ug/min | INTRAVENOUS | Status: DC
  Administered 2022-11-09: 2 ug/min via INTRAVENOUS
  Filled 2022-11-09 (×2): qty 250

## 2022-11-09 MED ORDER — HEPARIN BOLUS VIA INFUSION
3500.0000 [IU] | Freq: Once | INTRAVENOUS | Status: AC
  Administered 2022-11-09: 3500 [IU] via INTRAVENOUS
  Filled 2022-11-09: qty 3500

## 2022-11-09 MED ORDER — SODIUM CHLORIDE 0.9 % IV SOLN
2.0000 g | Freq: Once | INTRAVENOUS | Status: AC
  Administered 2022-11-09: 2 g via INTRAVENOUS
  Filled 2022-11-09: qty 20

## 2022-11-09 MED ORDER — SODIUM CHLORIDE 0.9 % IV SOLN
250.0000 mL | INTRAVENOUS | Status: DC
  Administered 2022-11-10: 250 mL via INTRAVENOUS

## 2022-11-09 NOTE — Progress Notes (Signed)
**Note De-Identified Bong Obfuscation** ANTICOAGULATION CONSULT NOTE - Initial Consult  Pharmacy Consult for heparin Indication: chest pain/ACS  Allergies  Allergen Reactions   Doxycycline Shortness Of Breath and Other (See Comments)    Drug interaction with Soriatane   Tramadol Shortness Of Breath    Did not work   Milnacipran     REACTION: dizzy   Apremilast Rash    Rash, able to take this currently    Patient Measurements:   Heparin Dosing Weight: 57kg  Vital Signs: Temp: 97.8 F (36.6 C) (02/09 2126) Temp Source: Oral (02/09 2126) BP: 98/79 (02/09 2230) Pulse Rate: 36 (02/09 2230)  Labs: Recent Labs    11/22/2022 2140  HGB 9.2*  HCT 31.4*  PLT 145*  LABPROT 15.4*  INR 1.2  CREATININE 3.84*  TROPONINIHS 2,474*    Estimated Creatinine Clearance: 14.3 mL/min (A) (by C-G formula based on SCr of 3.84 mg/dL (H)).   Medical History: Past Medical History:  Diagnosis Date   Allergy    Anxiety disorder    Arthritis    bil legs   Asthma    Cancer ([place])    Chronic bronchitis    Cirrhosis (HCC)    Complication of anesthesia    slow to wake up, one time she turned blue in recovery   COPD (chronic obstructive pulmonary disease) (HCC)    Depression    Diverticulitis    DJD (degenerative joint disease)    spine   Dyspnea    O2 at 3 L 24/7   Fibromyalgia    GERD (gastroesophageal reflux disease)    Glaucoma    History of thyroid cancer    HTN (hypertension)    Hyperlipidemia    Impaired glucose tolerance 05/08/2013   Lung cancer (HCC)    OSA (obstructive sleep apnea)    CPAP  last sleep study 8-10 yr.ag0   Polymyalgia ([place])    Pre-diabetes    Psoriasis    Rhinitis    Tobacco abuse      Assessment:  67 y.o. female with past medical history significant for etoh abuse, cirrhosis, gerd, hld, osa on cpap, lung ca, cad, hld who presents to the ED with complaint of AMS.  Per spouse she had 5 L paracentesis done yesterday. Pharmacy consulted to dose heparin drip for ACS/STEMI.  No prior AC  noted.  Scr 3.87, INR 1.2, hgb 9.2, plts 145, trop 2474  Goal of Therapy:  Heparin level 0.3-0.7 units/ml Monitor platelets by anticoagulation protocol: Yes   Plan:  Heparin bolus 3500 units x 1 Start heparin drip at 650 units/hr Heparin  level in 8 hours Daily CBC   Dolly Rias RPh 11/02/2022, 11:11 PM

## 2022-11-09 NOTE — ED Provider Notes (Signed)
**Note De-Identified [place] Obfuscation** [facility] Provider Note  CSN: [id number] Arrival date & time: 11/07/2022 2118  Chief Complaint(s) Nausea  HPI Hailey Hernandez is a 67 y.o. female with past medical history as below, significant for etoh abuse, cirrhosis, gerd, hld, osa on cpap, lung ca, cad, hld, on home O2 4L, who presents to the ED with complaint of ams. Pt from home secondary to AMS.  Per spouse she had 3.7 L paracentesis done yesterday (studies sent).  Prior to that she was sleepy and feeling unwell, vague abdominal pain, nausea.  After the paracentesis her symptoms precipitously worsened.  Nausea, worsening fatigue, unable to tolerate p.o. intake over the past 24 hours, very limited liquid intake by mouth.  No BRBPR or melena.  Denies fevers. She has vague chest pain/ dyspnea.  Malaise.  Ongoing confusion per spouse at bedside since yesterday morning.   D/w spouse and daughter at bedside.   Past Medical History Past Medical History:  Diagnosis Date   Allergy    Anxiety disorder    Arthritis    bil legs   Asthma    Cancer ([place])    Chronic bronchitis    Cirrhosis ([place])    Complication of anesthesia    slow to wake up, one time she turned blue in recovery   COPD (chronic obstructive pulmonary disease) (HCC)    Depression    Diverticulitis    DJD (degenerative joint disease)    spine   Dyspnea    O2 at 3 L 24/7   Fibromyalgia    GERD (gastroesophageal reflux disease)    Glaucoma    History of thyroid cancer    HTN (hypertension)    Hyperlipidemia    Impaired glucose tolerance 05/08/2013   Lung cancer (HCC)    OSA (obstructive sleep apnea)    CPAP  last sleep study 8-10 yr.ag0   Polymyalgia ([place])    Pre-diabetes    Psoriasis    Rhinitis    Tobacco abuse    Patient Active Problem List   Diagnosis Date Noted   CAD (coronary artery disease) 06/23/2022   Left-sided chest wall pain 12/14/2021   Hyperlipidemia associated with type 2 diabetes mellitus ([place])  05/03/2021   Squamous cell carcinoma of bronchus in left upper lobe ([place]) 02/01/2021   Encounter for antineoplastic chemotherapy 02/01/2021   Lung nodule    Abdominal wall mass of right lower quadrant 08/18/2020   Reactive depression 11/03/2018   Pneumonia of right middle lobe due to infectious organism 10/15/2018   Pneumonia, community acquired 05/09/2018   Trigger finger of right thumb 06/21/2017   Bronchitis 03/27/2017   High risk medication use 01/18/2017   Chronic respiratory failure with hypoxia ([place]) 11/30/2016   Venous stasis 05/28/2016   T2DM (type 2 diabetes mellitus) ([place]) 05/18/2016   Morbid obesity ([place]) 05/18/2016   Ulcer of skin ([place]) 05/07/2016   Polycythemia, secondary 06/10/2015   Allergic rhinitis 12/03/2014   Psoriasis with arthropathy ([place]) 05/17/2014   Chronic venous insufficiency 07/16/2013   Leg ulcer ([place]) 07/13/2013   Acute gouty arthritis 07/10/2013   Pain in both lower extremities 06/05/2013   Bilateral knee pain 06/05/2013   Left arm pain 06/05/2013   Medial epicondylitis of left elbow 05/08/2013   Peripheral edema 05/08/2013   Abdominal swelling 11/07/2012   Allergic conjunctivitis 11/07/2012   Rotator cuff syndrome of right shoulder 07/23/2012   Tendonitis, calcific, shoulder 07/23/2012   Back pain 04/26/2012   Cervical disc disease 04/25/2012 **Note De-Identified [place] Obfuscation** Preventative health care 04/19/2012   Tachyarrhythmia    History of thyroid cancer    Polymyalgia ([place])    Diverticulitis    DJD (degenerative joint disease)    COPD mixed type ([place])    Hyperlipidemia    SYNCOPE 12/13/2010   GERD 08/09/2010   Other dysphagia 08/09/2010   Psoriasis 07/16/2010   INSOMNIA 07/14/2010   Tobacco user 02/09/2010   Anxiety 12/30/2009   Obstructive sleep apnea 12/30/2009   CARPAL TUNNEL SYNDROME, BILATERAL 12/30/2009   Essential hypertension 12/30/2009   BREAST MASS, LEFT 12/30/2009   Fibromyalgia 12/30/2009   Osteoporosis 12/30/2009   LIVER FUNCTION TESTS, ABNORMAL,  HX OF 12/30/2009   Obesity hypoventilation syndrome ([place]) 12/30/2009   Home Medication(s) Prior to Admission medications   Medication Sig Start Date End Date Taking? Authorizing Provider  albuterol (PROVENTIL) (2.5 MG/3ML) 0.083% nebulizer solution Take 3 mLs (2.5 mg total) by nebulization every 4 (four) hours as needed for wheezing or shortness of breath. DX: 496 06/06/22   Gottschalk, Ashly M, DO  albuterol (VENTOLIN HFA) 108 (90 Base) MCG/ACT inhaler Inhale 2 puffs every 4-6 hors as needed- rescue 12/14/21   Baird Lyons D, MD  aspirin EC 81 MG tablet Take 81 mg by mouth daily.    [provider]  atorvastatin (LIPITOR) 80 MG tablet Take 1 tablet (80 mg total) by mouth daily. 10/27/21   [name], DO  Budeson-Glycopyrrol-Formoterol (BREZTRI AEROSPHERE) 160-9-4.8 MCG/ACT AERO Inhale 2 puffs into the lungs 2 (two) times daily. 12/14/21   Baird Lyons D, MD  calcium carbonate (CALTRATE 600) 1500 (600 Ca) MG TABS tablet Take 1 tablet (1,500 mg total) by mouth 2 (two) times daily with a meal. 08/12/20   Ronnie Doss M, DO  colchicine 0.6 MG tablet Take 1 tablet (0.6 mg total) by mouth daily. 10/27/21   [name], DO  FLUoxetine (PROZAC) 20 MG capsule Take 1 capsule (20 mg total) by mouth daily. 12/08/21   Ronnie Doss M, DO  fluticasone (FLONASE) 50 MCG/ACT nasal spray USE 2 SPRAYS IN EACH NOSTRIL ONCE DAILY 03/19/22   Ronnie Doss M, DO  lidocaine (LIDODERM) 5 % Place 1 patch onto the skin daily. Remove & Discard patch within 12 hours or as directed by MD 09/05/22   [name], DO  lisinopril (ZESTRIL) 20 MG tablet Take 1 tablet (20 mg total) by mouth daily. 10/27/21   Ronnie Doss M, DO  montelukast (SINGULAIR) 10 MG tablet TAKE 1 TABLET EVERY DAY 04/27/22   Ronnie Doss M, DO  Nebulizers (COMPRESSOR/NEBULIZER) MISC Use up to 4 times daily when needed 02/05/11   Baird Lyons D, MD  ondansetron (ZOFRAN-ODT) 4 MG disintegrating tablet TAKE 1  TABLET EVERY 8 HOURS AS NEEDED FOR NAUSEA OR VOMITING 10/30/22   Ronnie Doss M, DO  OXYGEN Inhale into the lungs. 4L    [provider]  Respiratory Therapy Supplies (FLUTTER) DEVI Patient to use device for ten minutes, three times daily 10/20/18   Fenton Foy, NP  TREMFYA 100 MG/ML SOPN Inject into the skin. 11/03/21   [provider]  doxepin (SINEQUAN) 10 MG capsule   12/10/17  [provider]  fexofenadine (ALLEGRA) 180 MG tablet Take 180 mg by mouth daily.    12/10/17  [provider] **Note De-Identified [place] Obfuscation** Past Surgical History Past Surgical History:  Procedure Laterality Date   APPENDECTOMY     BRONCHIAL BIOPSY  01/24/2021   Procedure: BRONCHIAL BIOPSIES;  Surgeon: Garner Nash, DO;  Location: [facility];  Service: Pulmonary;;   BRONCHIAL BRUSHINGS  01/24/2021   Procedure: BRONCHIAL BRUSHINGS;  Surgeon: Garner Nash, DO;  Location: [facility];  Service: Pulmonary;;   BRONCHIAL NEEDLE ASPIRATION BIOPSY  01/24/2021   Procedure: BRONCHIAL NEEDLE ASPIRATION BIOPSIES;  Surgeon: Garner Nash, DO;  Location: [facility];  Service: Pulmonary;;   CARPAL TUNNEL RELEASE  10/30/2011   Procedure: CARPAL TUNNEL RELEASE;  Surgeon: Wynonia Sours, MD;  Location: [facility];  Service: Orthopedics;  Laterality: Right;  and mass excision   CHOLECYSTECTOMY     CHOLESTEATOMA EXCISION     right ear   IR PARACENTESIS  11/08/2022   left shoulder spurs     ORIF right lower leg     partial throidectomy     TOTAL ABDOMINAL HYSTERECTOMY     VIDEO BRONCHOSCOPY WITH ENDOBRONCHIAL NAVIGATION N/A 01/24/2021   Procedure: VIDEO BRONCHOSCOPY WITH ENDOBRONCHIAL NAVIGATION;  Surgeon: Garner Nash, DO;  Location: [facility];  Service: Pulmonary;  Laterality: N/A;   VIDEO BRONCHOSCOPY WITH ENDOBRONCHIAL ULTRASOUND N/A 01/24/2021   Procedure:  VIDEO BRONCHOSCOPY WITH ENDOBRONCHIAL ULTRASOUND;  Surgeon: Garner Nash, DO;  Location: [facility];  Service: Pulmonary;  Laterality: N/A;   Family History Family History  Problem Relation Age of Onset   Heart disease Mother    Cancer Mother    Hyperlipidemia Mother    Hypertension Mother    Heart attack Mother    Heart disease Father    Heart attack Father    Emphysema Sister    Hypertension Sister    Heart attack Sister    Emphysema Sister    Alpha-1 antitrypsin deficiency Sister    Alpha-1 antitrypsin deficiency Sister    Allergies Sister    Asthma Sister    Cancer Sister    Hyperlipidemia Daughter    Hypertension Daughter    Cancer Brother    Hyperlipidemia Brother    Hypertension Brother    Hypertension Son    Tuberculosis Other        grandmother   Breast cancer Neg Hx     Social History Social History   Tobacco Use   Smoking status: Every Day    Packs/day: 1.00    Years: 45.00    Total pack years: 45.00    Types: Cigarettes   Smokeless tobacco: Never   Tobacco comments:    Smokes 1 pack a day [facility] 12/14/2021  Vaping Use   Vaping Use: Never used  Substance Use Topics   Alcohol use: No   Drug use: No   Allergies Doxycycline, Tramadol, Milnacipran, and Apremilast  Review of Systems Review of Systems  Constitutional:  Positive for appetite change and fatigue.  HENT:  Negative for facial swelling and trouble swallowing.   Eyes:  Negative for discharge and redness.  Respiratory:  Positive for shortness of breath. Negative for cough.   Cardiovascular:  Positive for chest pain. Negative for palpitations.  Gastrointestinal:  Positive for abdominal pain and nausea. Negative for vomiting.  Genitourinary:  Negative for dysuria and flank pain.  Musculoskeletal:  Positive for arthralgias. Negative for back pain and gait problem.  Skin:  Negative for pallor and rash.  Neurological:  Negative for syncope and headaches.    Physical Exam Vital Signs  I  have reviewed **Note De-Identified [place] Obfuscation** the triage vital signs BP (!) 87/35 (BP Location: Left Arm) Comment: Simultaneous filing. User may not have seen previous data. Comment (BP Location): Simultaneous filing. User may not have seen previous data.  Pulse (!) 130 Comment: Simultaneous filing. User may not have seen previous data.  Temp 97.8 F (36.6 C) (Oral)   Resp (!) 26 Comment: Simultaneous filing. User may not have seen previous data.  SpO2 90% Comment: Simultaneous filing. User may not have seen previous data. Physical Exam Vitals and nursing note reviewed.  Constitutional:      Appearance: She is ill-appearing.  HENT:     Head: Normocephalic and atraumatic.     Right Ear: External ear normal.     Left Ear: External ear normal.     Nose: Nose normal.     Mouth/Throat:     Mouth: Mucous membranes are dry.  Eyes:     General: No scleral icterus.       Right eye: No discharge.        Left eye: No discharge.  Cardiovascular:     Rate and Rhythm: Regular rhythm. Bradycardia present.     Pulses: Normal pulses.     Heart sounds: Normal heart sounds. No murmur heard. Pulmonary:     Effort: Pulmonary effort is normal. No respiratory distress.     Breath sounds: Normal breath sounds. No wheezing.  Abdominal:     General: Abdomen is flat. There is no distension.     Palpations: Abdomen is soft.     Tenderness: There is abdominal tenderness. There is no guarding or rebound.    Musculoskeletal:     Cervical back: No rigidity.  Neurological:     Mental Status: She is alert and oriented to person, place, and time.     GCS: GCS eye subscore is 4. GCS verbal subscore is 5. GCS motor subscore is 6.     ED Results and Treatments Labs (all labs ordered are listed, but only abnormal results are displayed) Labs Reviewed  CULTURE, BLOOD (ROUTINE X 2)  CULTURE, BLOOD (ROUTINE X 2)  RESP PANEL BY RT-PCR (RSV, FLU A&B, COVID)  RVPGX2  C DIFFICILE QUICK SCREEN W PCR REFLEX    COMPREHENSIVE METABOLIC PANEL   LACTIC ACID, PLASMA  LACTIC ACID, PLASMA  CBC WITH DIFFERENTIAL/PLATELET  PROTIME-INR  URINALYSIS, ROUTINE W REFLEX MICROSCOPIC  AMMONIA  TROPONIN I (HIGH SENSITIVITY)                                                                                                                          Radiology DG Chest Port 1 View  Result Date: 11/20/2022 CLINICAL DATA:  Nausea EXAM: PORTABLE CHEST 1 VIEW COMPARISON:  Chest x-ray 10/23/2022.  CT of the chest 06/05/2022 FINDINGS: Heart is enlarged, unchanged. There is stable elevation of the left hemidiaphragm. There are patchy opacities in the left lung apex with some left apical pleural thickening which appears stable. No new focal lung infiltrate, pleural effusion or **Note De-Identified [place] Obfuscation** pneumothorax. No acute fracture. IMPRESSION: 1. No acute cardiopulmonary process. 2. Stable cardiomegaly. 3. Stable chronic opacities in the left lung apex. Electronically Signed   By: Ronney Asters M.D.   On: 11/10/2022 22:40    Pertinent labs & imaging results that were available during my care of the patient were reviewed by me and considered in my medical decision making (see MDM for details).  Medications Ordered in ED Medications  sodium chloride 0.9 % bolus 1,000 mL (has no administration in time range)  albumin human 25 % solution 50 g (has no administration in time range)  cefTRIAXone (ROCEPHIN) 2 g in sodium chloride 0.9 % 100 mL IVPB (has no administration in time range)                                                                                                                                     Procedures .Critical Care  Performed by: Jeanell Sparrow, DO Authorized by: Jeanell Sparrow, DO   Critical care provider statement:    Critical care time (minutes):  65   Critical care time was exclusive of:  Separately billable procedures and treating other patients   Critical care was necessary to treat or prevent imminent or life-threatening deterioration of the following  conditions:  Shock, renal failure and dehydration   Critical care was time spent personally by me on the following activities:  Development of treatment plan with patient or surrogate, discussions with consultants, evaluation of patient's response to treatment, examination of patient, ordering and review of laboratory studies, ordering and review of radiographic studies, ordering and performing treatments and interventions, pulse oximetry, re-evaluation of patient's condition, review of old charts and obtaining history from patient or surrogate   (including critical care time)  Medical Decision Making / ED Course    Medical Decision Making:    Hailey Hernandez is a 67 y.o. female with past medical history as below, significant for etoh abuse, cirrhosis, gerd, hld, osa on cpap, lung ca, cad, hld who presents to the ED with complaint of ams. . The complaint involves an extensive differential diagnosis and also carries with it a high risk of complications and morbidity.  Serious etiology was considered. Ddx includes but is not limited to: Differential diagnosis includes but is not exclusive to ectopic pregnancy, ovarian cyst, ovarian torsion, acute appendicitis, urinary tract infection, endometriosis, bowel obstruction, hernia, colitis, renal colic, gastroenteritis, volvulus etc. Differential diagnoses for altered mental status includes but is not exclusive to alcohol, illicit or prescription medications, intracranial pathology such as stroke, intracerebral hemorrhage, fever or infectious causes including sepsis, hypoxemia, uremia, trauma, endocrine related disorders such as diabetes, hypoglycemia, thyroid-related diseases, etc. In my evaluation of this patient's dyspnea my DDx includes, but is not limited to, pneumonia, pulmonary embolism, pneumothorax, pulmonary edema, metabolic acidosis, asthma, COPD, cardiac cause, anemia, anxiety, etc.     Complete initial physical exam performed, notably the **Note De-Identified [place] Obfuscation** patient  was care documentation, prior labs and imaging Home medications.    Reviewed and confirmed nursing documentation for past medical history, family history, social history.  Vital signs reviewed.  Follows with Dr. Scarlette Shorts, GI Patient had paracentesis performed yesterday.  Spouse with unknown quantity of volume removal.   Clinical Course as of 11/10/22 0045  Fri Nov 09, 2022  2225 Per spouse her BP over last few months has been low- around 44-034 systolic [SG]  7425 Spoke with cardiology, Dr Shellia Carwin, recommend hold anti-hypertensive, start heparin x24 hours, no cath tonight. Will see in consult, recommend send to [facility].  [SG]  2312 Creatinine(!): 3.84 ARF, prior Cr around 2 [SG]  2336 She is on 4LNC typically [SG]  2342 Persistently hypotensive, bradycardic; junctional. Start peripheral levo. Continue IVF [SG]    Clinical Course User Index [SG] Wynona Dove A, DO   Pt with shock, possibly 2/2 volume loss but unclear at this time. She is bradycardic / junctional on EKG. Trop is elev, favored to be demand ischemia per cardiology. She has some vague chest pain / arthralgias / dyspnea worsened from her prior. She was given empiric rocephin given hx ascites/cirrhosis. Started IVF/albumin/levophed/heparin. Remainder of workup is pending at this time but pt appears acutely ill, volume depleted, hypotensive on levophed; will likely require icu admission. Recommend [facility]fr [facility] per cardiology request. Signed out to incoming EDP pending remainder of labs/imaging/and eventual admission.    Additional history obtained: -Additional history obtained from family -External records from outside source obtained and reviewed including: Chart review including previous notes, labs, imaging, consultation notes including primary care documentation, home meds, prior labs/imaging, recent paracentesis yestd w 3.7 L removal; studies sent   Lab Tests: -I ordered, reviewed, and interpreted labs.   The pertinent  results include:   Labs Reviewed  CULTURE, BLOOD (ROUTINE X 2)  CULTURE, BLOOD (ROUTINE X 2)  RESP PANEL BY RT-PCR (RSV, FLU A&B, COVID)  RVPGX2  C DIFFICILE QUICK SCREEN W PCR REFLEX    COMPREHENSIVE METABOLIC PANEL  LACTIC ACID, PLASMA  LACTIC ACID, PLASMA  CBC WITH DIFFERENTIAL/PLATELET  PROTIME-INR  URINALYSIS, ROUTINE W REFLEX MICROSCOPIC  AMMONIA  TROPONIN I (HIGH SENSITIVITY)    Notable for as above  EKG   EKG Interpretation  Date/Time:    Ventricular Rate:    PR Interval:    QRS Duration:   QT Interval:    QTC Calculation:   R Axis:     Text Interpretation:        EKG not crossing over to muse, nursing attempting to correct but shows junctional bradycardia, rate 38-51 on tele.    Imaging Studies ordered: I ordered imaging studies including CXR I independently visualized the following imaging with scope of interpretation limited to determining acute life threatening conditions related to emergency care: chronic changes, CTAP / CTH pending I independently visualized and interpreted imaging. I agree with the radiologist interpretation   Medicines ordered and prescription drug management: Meds ordered this encounter  Medications   sodium chloride 0.9 % bolus 1,000 mL   albumin human 25 % solution 50 g   cefTRIAXone (ROCEPHIN) 2 g in sodium chloride 0.9 % 100 mL IVPB    Order Specific Question:   Antibiotic Indication:    Answer:   Intra-abdominal    -I have reviewed the patients home medicines and have made adjustments as needed   Consultations Obtained: I requested consultation with the DR custovic cardiology,  and discussed lab and imaging findings **Note De-Identified [place] Obfuscation** as well as pertinent plan - they recommend: admit, heparin   Cardiac Monitoring: The patient was maintained on a cardiac monitor.  I personally viewed and interpreted the cardiac monitored which showed an underlying rhythm of: junctional brady  Social Determinants of Health:  Diagnosis or treatment  significantly limited by social determinants of health: obesity   Reevaluation: After the interventions noted above, I reevaluated the patient and found that they have stayed the same  Co morbidities that complicate the patient evaluation  Past Medical History:  Diagnosis Date   Allergy    Anxiety disorder    Arthritis    bil legs   Asthma    Cancer ([place])    Chronic bronchitis    Cirrhosis ([place])    Complication of anesthesia    slow to wake up, one time she turned blue in recovery   COPD (chronic obstructive pulmonary disease) (HCC)    Depression    Diverticulitis    DJD (degenerative joint disease)    spine   Dyspnea    O2 at 3 L 24/7   Fibromyalgia    GERD (gastroesophageal reflux disease)    Glaucoma    History of thyroid cancer    HTN (hypertension)    Hyperlipidemia    Impaired glucose tolerance 05/08/2013   Lung cancer (HCC)    OSA (obstructive sleep apnea)    CPAP  last sleep study 8-10 yr.ag0   Polymyalgia ([place])    Pre-diabetes    Psoriasis    Rhinitis    Tobacco abuse       Dispostion: Disposition decision including need for hospitalization was considered, and patient dispo pending.    Final Clinical Impression(s) / ED Diagnoses Final diagnoses:  NSTEMI (non-ST elevated myocardial infarction) ([place])  Acute renal failure superimposed on chronic kidney disease, unspecified acute renal failure type, unspecified CKD stage ([place])  Encephalopathy  Shock ([place])     This chart was dictated using voice recognition software.  Despite best efforts to proofread,  errors can occur which can change the documentation meaning.    Jeanell Sparrow, DO 11/10/22 [phone]

## 2022-11-09 NOTE — ED Triage Notes (Addendum)
**Note De-Identified [place] Obfuscation** N/V x 2 weeks, AMS starting today prompting husband to bring her to ER.   Paracentesis yesterday, h/o cirrhosis and lung CA  Arrives on 4L O2 from baseline home O2

## 2022-11-10 ENCOUNTER — Emergency Department (HOSPITAL_COMMUNITY): Payer: Medicare Other

## 2022-11-10 ENCOUNTER — Inpatient Hospital Stay (HOSPITAL_COMMUNITY): Payer: Medicare Other

## 2022-11-10 ENCOUNTER — Other Ambulatory Visit (HOSPITAL_COMMUNITY): Payer: Medicare Other

## 2022-11-10 DIAGNOSIS — E874 Mixed disorder of acid-base balance: Secondary | ICD-10-CM | POA: Diagnosis present

## 2022-11-10 DIAGNOSIS — Z7189 Other specified counseling: Secondary | ICD-10-CM | POA: Diagnosis not present

## 2022-11-10 DIAGNOSIS — J9622 Acute and chronic respiratory failure with hypercapnia: Secondary | ICD-10-CM | POA: Diagnosis present

## 2022-11-10 DIAGNOSIS — K766 Portal hypertension: Secondary | ICD-10-CM | POA: Diagnosis present

## 2022-11-10 DIAGNOSIS — Z66 Do not resuscitate: Secondary | ICD-10-CM | POA: Diagnosis not present

## 2022-11-10 DIAGNOSIS — I2721 Secondary pulmonary arterial hypertension: Secondary | ICD-10-CM | POA: Diagnosis present

## 2022-11-10 DIAGNOSIS — J9602 Acute respiratory failure with hypercapnia: Secondary | ICD-10-CM | POA: Diagnosis not present

## 2022-11-10 DIAGNOSIS — N179 Acute kidney failure, unspecified: Secondary | ICD-10-CM | POA: Diagnosis present

## 2022-11-10 DIAGNOSIS — Z515 Encounter for palliative care: Secondary | ICD-10-CM | POA: Diagnosis not present

## 2022-11-10 DIAGNOSIS — E440 Moderate protein-calorie malnutrition: Secondary | ICD-10-CM | POA: Diagnosis present

## 2022-11-10 DIAGNOSIS — R188 Other ascites: Secondary | ICD-10-CM | POA: Diagnosis present

## 2022-11-10 DIAGNOSIS — R579 Shock, unspecified: Secondary | ICD-10-CM | POA: Diagnosis present

## 2022-11-10 DIAGNOSIS — J441 Chronic obstructive pulmonary disease with (acute) exacerbation: Secondary | ICD-10-CM | POA: Diagnosis not present

## 2022-11-10 DIAGNOSIS — I509 Heart failure, unspecified: Secondary | ICD-10-CM | POA: Diagnosis not present

## 2022-11-10 DIAGNOSIS — I959 Hypotension, unspecified: Secondary | ICD-10-CM | POA: Diagnosis not present

## 2022-11-10 DIAGNOSIS — J9601 Acute respiratory failure with hypoxia: Secondary | ICD-10-CM | POA: Diagnosis not present

## 2022-11-10 DIAGNOSIS — Z1152 Encounter for screening for COVID-19: Secondary | ICD-10-CM | POA: Diagnosis not present

## 2022-11-10 DIAGNOSIS — M353 Polymyalgia rheumatica: Secondary | ICD-10-CM | POA: Diagnosis present

## 2022-11-10 DIAGNOSIS — J690 Pneumonitis due to inhalation of food and vomit: Secondary | ICD-10-CM | POA: Diagnosis not present

## 2022-11-10 DIAGNOSIS — F32A Depression, unspecified: Secondary | ICD-10-CM | POA: Diagnosis present

## 2022-11-10 DIAGNOSIS — D6959 Other secondary thrombocytopenia: Secondary | ICD-10-CM | POA: Diagnosis present

## 2022-11-10 DIAGNOSIS — G9341 Metabolic encephalopathy: Secondary | ICD-10-CM | POA: Diagnosis present

## 2022-11-10 DIAGNOSIS — I21A1 Myocardial infarction type 2: Secondary | ICD-10-CM | POA: Diagnosis present

## 2022-11-10 DIAGNOSIS — D539 Nutritional anemia, unspecified: Secondary | ICD-10-CM | POA: Diagnosis present

## 2022-11-10 DIAGNOSIS — E870 Hyperosmolality and hypernatremia: Secondary | ICD-10-CM | POA: Diagnosis not present

## 2022-11-10 DIAGNOSIS — L89153 Pressure ulcer of sacral region, stage 3: Secondary | ICD-10-CM | POA: Diagnosis present

## 2022-11-10 DIAGNOSIS — J9621 Acute and chronic respiratory failure with hypoxia: Secondary | ICD-10-CM | POA: Diagnosis present

## 2022-11-10 DIAGNOSIS — F05 Delirium due to known physiological condition: Secondary | ICD-10-CM | POA: Diagnosis not present

## 2022-11-10 DIAGNOSIS — I129 Hypertensive chronic kidney disease with stage 1 through stage 4 chronic kidney disease, or unspecified chronic kidney disease: Secondary | ICD-10-CM | POA: Diagnosis present

## 2022-11-10 DIAGNOSIS — I214 Non-ST elevation (NSTEMI) myocardial infarction: Secondary | ICD-10-CM | POA: Diagnosis not present

## 2022-11-10 DIAGNOSIS — G9340 Encephalopathy, unspecified: Secondary | ICD-10-CM | POA: Diagnosis not present

## 2022-11-10 DIAGNOSIS — N1832 Chronic kidney disease, stage 3b: Secondary | ICD-10-CM | POA: Diagnosis present

## 2022-11-10 LAB — BLOOD CULTURE ID PANEL (REFLEXED) - BCID2

## 2022-11-10 LAB — POCT I-STAT 7, (LYTES, BLD GAS, ICA,H+H)
Acid-base deficit: 9 mmol/L — ABNORMAL HIGH (ref 0.0–2.0)
Acid-base deficit: 7 mmol/L — ABNORMAL HIGH (ref 0.0–2.0)
Acid-base deficit: 7 mmol/L — ABNORMAL HIGH (ref 0.0–2.0)
Acid-base deficit: 9 mmol/L — ABNORMAL HIGH (ref 0.0–2.0)
Bicarbonate: 20.8 mmol/L (ref 20.0–28.0)
Bicarbonate: 20.8 mmol/L (ref 20.0–28.0)
Bicarbonate: 20.8 mmol/L (ref 20.0–28.0)
Bicarbonate: 17.9 mmol/L — ABNORMAL LOW (ref 20.0–28.0)
Calcium, Ion: 1.18 mmol/L (ref 1.15–1.40)
Calcium, Ion: 1.12 mmol/L — ABNORMAL LOW (ref 1.15–1.40)
Calcium, Ion: 1.14 mmol/L — ABNORMAL LOW (ref 1.15–1.40)
Calcium, Ion: 1.18 mmol/L (ref 1.15–1.40)
HCT: 28 % — ABNORMAL LOW (ref 36.0–46.0)
HCT: 27 % — ABNORMAL LOW (ref 36.0–46.0)
HCT: 26 % — ABNORMAL LOW (ref 36.0–46.0)
HCT: 27 % — ABNORMAL LOW (ref 36.0–46.0)
Hemoglobin: 9.5 g/dL — ABNORMAL LOW (ref 12.0–15.0)
Hemoglobin: 9.2 g/dL — ABNORMAL LOW (ref 12.0–15.0)
Hemoglobin: 8.8 g/dL — ABNORMAL LOW (ref 12.0–15.0)
Hemoglobin: 9.2 g/dL — ABNORMAL LOW (ref 12.0–15.0)
O2 Saturation: 97 %
O2 Saturation: 99 %
O2 Saturation: 95 %
O2 Saturation: 85 %
Patient temperature: 97.7
Patient temperature: 97.7
Patient temperature: 98.1
Patient temperature: 98.2
Potassium: 4.8 mmol/L (ref 3.5–5.1)
Potassium: 4.7 mmol/L (ref 3.5–5.1)
Potassium: 4.2 mmol/L (ref 3.5–5.1)
Potassium: 4.2 mmol/L (ref 3.5–5.1)
Sodium: 142 mmol/L (ref 135–145)
Sodium: 141 mmol/L (ref 135–145)
Sodium: 141 mmol/L (ref 135–145)
Sodium: 141 mmol/L (ref 135–145)
TCO2: 23 mmol/L (ref 22–32)
TCO2: 22 mmol/L (ref 22–32)
TCO2: 22 mmol/L (ref 22–32)
TCO2: 19 mmol/L — ABNORMAL LOW (ref 22–32)
pCO2 arterial: 68.7 mmHg (ref 32–48)
pCO2 arterial: 52.6 mmHg — ABNORMAL HIGH (ref 32–48)
pCO2 arterial: 50.8 mmHg — ABNORMAL HIGH (ref 32–48)
pCO2 arterial: 44.3 mmHg (ref 32–48)
pH, Arterial: 7.085 — CL (ref 7.35–7.45)
pH, Arterial: 7.202 — ABNORMAL LOW (ref 7.35–7.45)
pH, Arterial: 7.218 — ABNORMAL LOW (ref 7.35–7.45)
pH, Arterial: 7.213 — ABNORMAL LOW (ref 7.35–7.45)
pO2, Arterial: 121 mmHg — ABNORMAL HIGH (ref 83–108)
pO2, Arterial: 182 mmHg — ABNORMAL HIGH (ref 83–108)
pO2, Arterial: 93 mmHg (ref 83–108)
pO2, Arterial: 59 mmHg — ABNORMAL LOW (ref 83–108)

## 2022-11-10 LAB — PHOSPHORUS
Phosphorus: 5.4 mg/dL — ABNORMAL HIGH (ref 2.5–4.6)
Phosphorus: 5.4 mg/dL — ABNORMAL HIGH (ref 2.5–4.6)

## 2022-11-10 LAB — LACTIC ACID, PLASMA
Lactic Acid, Venous: 1 mmol/L (ref 0.5–1.9)
Lactic Acid, Venous: 1.7 mmol/L (ref 0.5–1.9)

## 2022-11-10 LAB — GLUCOSE, CAPILLARY
Glucose-Capillary: 118 mg/dL — ABNORMAL HIGH (ref 70–99)
Glucose-Capillary: 143 mg/dL — ABNORMAL HIGH (ref 70–99)
Glucose-Capillary: 154 mg/dL — ABNORMAL HIGH (ref 70–99)

## 2022-11-10 LAB — BASIC METABOLIC PANEL
Anion gap: 12 (ref 5–15)
BUN: 63 mg/dL — ABNORMAL HIGH (ref 8–23)
CO2: 19 mmol/L — ABNORMAL LOW (ref 22–32)
Calcium: 7.8 mg/dL — ABNORMAL LOW (ref 8.9–10.3)
Chloride: 106 mmol/L (ref 98–111)
Creatinine, Ser: 4.71 mg/dL — ABNORMAL HIGH (ref 0.44–1.00)
GFR, Estimated: 10 mL/min — ABNORMAL LOW (ref >60)
Glucose, Bld: 138 mg/dL — ABNORMAL HIGH (ref 70–99)
Potassium: 4 mmol/L (ref 3.5–5.1)
Sodium: 137 mmol/L (ref 135–145)

## 2022-11-10 LAB — CBC
HCT: 28.6 % — ABNORMAL LOW (ref 36.0–46.0)
Hemoglobin: 8.4 g/dL — ABNORMAL LOW (ref 12.0–15.0)
MCH: 32.2 pg (ref 26.0–34.0)
MCHC: 29.4 g/dL — ABNORMAL LOW (ref 30.0–36.0)
MCV: 109.6 fL — ABNORMAL HIGH (ref 80.0–100.0)
Platelets: 184 10*3/uL (ref 150–400)
RBC: 2.61 MIL/uL — ABNORMAL LOW (ref 3.87–5.11)
RDW: 16.3 % — ABNORMAL HIGH (ref 11.5–15.5)
WBC: 12.3 10*3/uL — ABNORMAL HIGH (ref 4.0–10.5)
nRBC: 0 % (ref 0.0–0.2)

## 2022-11-10 LAB — CREATININE, SERUM
Creatinine, Ser: 4.47 mg/dL — ABNORMAL HIGH (ref 0.44–1.00)
GFR, Estimated: 10 mL/min — ABNORMAL LOW (ref >60)

## 2022-11-10 LAB — HIV ANTIBODY (ROUTINE TESTING W REFLEX): HIV Screen 4th Generation wRfx: NONREACTIVE

## 2022-11-10 LAB — BLOOD GAS, ARTERIAL
Acid-base deficit: 9.5 mmol/L — ABNORMAL HIGH (ref 0.0–2.0)
Bicarbonate: 20.1 mmol/L (ref 20.0–28.0)
O2 Saturation: 100 %
Patient temperature: 36.9
pCO2 arterial: 59 mmHg — ABNORMAL HIGH (ref 32–48)
pH, Arterial: 7.14 — CL (ref 7.35–7.45)
pO2, Arterial: 262 mmHg — ABNORMAL HIGH (ref 83–108)

## 2022-11-10 LAB — TROPONIN I (HIGH SENSITIVITY)
Troponin I (High Sensitivity): 2308 ng/L (ref <18)
Troponin I (High Sensitivity): 4599 ng/L (ref <18)
Troponin I (High Sensitivity): 5554 ng/L (ref <18)

## 2022-11-10 LAB — HEPARIN LEVEL (UNFRACTIONATED)
Heparin Unfractionated: <0.1 IU/mL — ABNORMAL LOW (ref 0.30–0.70)
Heparin Unfractionated: <0.1 IU/mL — ABNORMAL LOW (ref 0.30–0.70)

## 2022-11-10 LAB — MAGNESIUM
Magnesium: 1.6 mg/dL — ABNORMAL LOW (ref 1.7–2.4)
Magnesium: 2.1 mg/dL (ref 1.7–2.4)

## 2022-11-10 LAB — MRSA NEXT GEN BY PCR, NASAL: MRSA by PCR Next Gen: NOT DETECTED

## 2022-11-10 MED ORDER — FENTANYL CITRATE PF 50 MCG/ML IJ SOSY
25.0000 ug | PREFILLED_SYRINGE | Freq: Once | INTRAMUSCULAR | Status: DC
  Filled 2022-11-10: qty 1

## 2022-11-10 MED ORDER — DEXTROSE 5 % IV SOLN
250.0000 mg | INTRAVENOUS | Status: AC
  Administered 2022-11-10 – 2022-11-14 (×5): 250 mg via INTRAVENOUS
  Filled 2022-11-10 (×6): qty 2.5

## 2022-11-10 MED ORDER — ATROPINE SULFATE 1 MG/10ML IJ SOSY
PREFILLED_SYRINGE | INTRAMUSCULAR | Status: AC
  Filled 2022-11-10: qty 10

## 2022-11-10 MED ORDER — FENTANYL CITRATE PF 50 MCG/ML IJ SOSY: 100.0000 ug | PREFILLED_SYRINGE | Freq: Once | INTRAMUSCULAR | Status: DC

## 2022-11-10 MED ORDER — PROPOFOL 1000 MG/100ML IV EMUL
5.0000 ug/kg/min | INTRAVENOUS | Status: DC
  Filled 2022-11-10: qty 100

## 2022-11-10 MED ORDER — ALBUMIN HUMAN 5 % IV SOLN
INTRAVENOUS | Status: AC
  Administered 2022-11-10: 25 g via INTRAVENOUS
  Filled 2022-11-10: qty 250

## 2022-11-10 MED ORDER — SODIUM BICARBONATE 8.4 % IV SOLN
INTRAVENOUS | Status: DC
  Filled 2022-11-10: qty 1000

## 2022-11-10 MED ORDER — MAGNESIUM SULFATE 2 GM/50ML IV SOLN
2.0000 g | Freq: Once | INTRAVENOUS | Status: AC
  Administered 2022-11-10: 2 g via INTRAVENOUS
  Filled 2022-11-10: qty 50

## 2022-11-10 MED ORDER — PANTOPRAZOLE SODIUM 40 MG IV SOLR
40.0000 mg | INTRAVENOUS | Status: DC
  Administered 2022-11-10 – 2022-11-17 (×9): 40 mg via INTRAVENOUS
  Filled 2022-11-10 (×9): qty 10

## 2022-11-10 MED ORDER — VANCOMYCIN HCL 1750 MG/350ML IV SOLN
1750.0000 mg | Freq: Once | INTRAVENOUS | Status: AC
  Administered 2022-11-10: 1750 mg via INTRAVENOUS
  Filled 2022-11-10: qty 350

## 2022-11-10 MED ORDER — POLYETHYLENE GLYCOL 3350 17 G PO PACK: 17.0000 g | PACK | Freq: Every day | ORAL | Status: DC | PRN

## 2022-11-10 MED ORDER — FENTANYL BOLUS VIA INFUSION: 25.0000 ug | INTRAVENOUS | Status: DC | PRN

## 2022-11-10 MED ORDER — NOREPINEPHRINE 4 MG/250ML-% IV SOLN
0.0000 ug/min | INTRAVENOUS | Status: DC
  Administered 2022-11-10: 28 ug/min via INTRAVENOUS
  Administered 2022-11-10 (×2): 24 ug/min via INTRAVENOUS
  Filled 2022-11-10 (×2): qty 250

## 2022-11-10 MED ORDER — SODIUM BICARBONATE 8.4 % IV SOLN
INTRAVENOUS | Status: AC
  Administered 2022-11-10: 50 meq via INTRAVENOUS
  Filled 2022-11-10: qty 50

## 2022-11-10 MED ORDER — DOCUSATE SODIUM 100 MG PO CAPS: 100.0000 mg | ORAL_CAPSULE | Freq: Two times a day (BID) | ORAL | Status: DC | PRN

## 2022-11-10 MED ORDER — NOREPINEPHRINE 16 MG/250ML-% IV SOLN
0.0000 ug/min | INTRAVENOUS | Status: DC
  Administered 2022-11-10: 32 ug/min via INTRAVENOUS
  Administered 2022-11-10: 24 ug/min via INTRAVENOUS
  Administered 2022-11-11: 35 ug/min via INTRAVENOUS
  Administered 2022-11-11 (×2): 40 ug/min via INTRAVENOUS
  Administered 2022-11-12: 34 ug/min via INTRAVENOUS
  Administered 2022-11-12: 18 ug/min via INTRAVENOUS
  Administered 2022-11-12: 32 ug/min via INTRAVENOUS
  Administered 2022-11-13 (×2): 16 ug/min via INTRAVENOUS
  Administered 2022-11-14: 14 ug/min via INTRAVENOUS
  Administered 2022-11-16: 5 ug/min via INTRAVENOUS
  Filled 2022-11-10 (×12): qty 250

## 2022-11-10 MED ORDER — VANCOMYCIN VARIABLE DOSE PER UNSTABLE RENAL FUNCTION (PHARMACIST DOSING): Status: DC

## 2022-11-10 MED ORDER — SUCCINYLCHOLINE CHLORIDE 200 MG/10ML IV SOSY
PREFILLED_SYRINGE | INTRAVENOUS | Status: AC
  Administered 2022-11-10: 100 mg
  Filled 2022-11-10: qty 10

## 2022-11-10 MED ORDER — FENTANYL 2500MCG IN NS 250ML (10MCG/ML) PREMIX INFUSION
25.0000 ug/h | INTRAVENOUS | Status: DC
  Administered 2022-11-10 – 2022-11-11 (×2): 25 ug/h via INTRAVENOUS
  Filled 2022-11-10 (×2): qty 250

## 2022-11-10 MED ORDER — SODIUM CHLORIDE 0.9 % IV SOLN: INTRAVENOUS | Status: DC

## 2022-11-10 MED ORDER — MIDAZOLAM-SODIUM CHLORIDE 100-0.9 MG/100ML-% IV SOLN
0.5000 mg/h | INTRAVENOUS | Status: DC
  Administered 2022-11-10: 0.5 mg/h via INTRAVENOUS
  Filled 2022-11-10: qty 100

## 2022-11-10 MED ORDER — ATROPINE SULFATE 1 MG/10ML IJ SOSY
1.0000 mg | PREFILLED_SYRINGE | Freq: Once | INTRAMUSCULAR | Status: AC
  Administered 2022-11-10: 1 mg via INTRAVENOUS

## 2022-11-10 MED ORDER — ETOMIDATE 2 MG/ML IV SOLN
INTRAVENOUS | Status: AC
  Administered 2022-11-10: 20 mg
  Filled 2022-11-10: qty 10

## 2022-11-10 MED ORDER — HEPARIN BOLUS VIA INFUSION
2500.0000 [IU] | Freq: Once | INTRAVENOUS | Status: AC
  Administered 2022-11-10: 2500 [IU] via INTRAVENOUS
  Filled 2022-11-10: qty 2500

## 2022-11-10 MED ORDER — VITAL 1.5 CAL PO LIQD
1000.0000 mL | ORAL | Status: DC
  Administered 2022-11-10 – 2022-11-12 (×3): 1000 mL

## 2022-11-10 MED ORDER — ORAL CARE MOUTH RINSE: 15.0000 mL | OROMUCOSAL | Status: DC | PRN

## 2022-11-10 MED ORDER — SODIUM CHLORIDE 0.9 % IV SOLN
2.0000 g | INTRAVENOUS | Status: DC
  Administered 2022-11-10 – 2022-11-13 (×4): 2 g via INTRAVENOUS
  Filled 2022-11-10 (×4): qty 20

## 2022-11-10 MED ORDER — PROSOURCE TF20 ENFIT COMPATIBL EN LIQD
60.0000 mL | Freq: Every day | ENTERAL | Status: DC
  Administered 2022-11-10 – 2022-11-15 (×6): 60 mL
  Filled 2022-11-10 (×6): qty 60

## 2022-11-10 MED ORDER — VASOPRESSIN 20 UNITS/100 ML INFUSION FOR SHOCK
0.0000 [IU]/min | INTRAVENOUS | Status: DC
  Administered 2022-11-10 – 2022-11-13 (×7): 0.03 [IU]/min via INTRAVENOUS
  Administered 2022-11-14 – 2022-11-15 (×2): 0.02 [IU]/min via INTRAVENOUS
  Filled 2022-11-10 (×10): qty 100

## 2022-11-10 MED ORDER — SODIUM BICARBONATE 8.4 % IV SOLN: 50.0000 meq | Freq: Once | INTRAVENOUS | Status: AC

## 2022-11-10 MED ORDER — CHLORHEXIDINE GLUCONATE CLOTH 2 % EX PADS
6.0000 | MEDICATED_PAD | Freq: Every day | CUTANEOUS | Status: DC
  Administered 2022-11-10 – 2022-11-18 (×11): 6 via TOPICAL

## 2022-11-10 MED ORDER — ORAL CARE MOUTH RINSE
15.0000 mL | OROMUCOSAL | Status: DC
  Administered 2022-11-10 – 2022-11-15 (×64): 15 mL via OROMUCOSAL

## 2022-11-10 MED ORDER — IPRATROPIUM-ALBUTEROL 0.5-2.5 (3) MG/3ML IN SOLN
3.0000 mL | RESPIRATORY_TRACT | Status: DC
  Administered 2022-11-10 – 2022-11-17 (×45): 3 mL via RESPIRATORY_TRACT
  Filled 2022-11-10 (×47): qty 3

## 2022-11-10 MED ORDER — ALBUMIN HUMAN 5 % IV SOLN: 25.0000 g | Freq: Once | INTRAVENOUS | Status: AC

## 2022-11-10 MED ORDER — VITAL HIGH PROTEIN PO LIQD
1000.0000 mL | ORAL | Status: DC
  Administered 2022-11-10: 1000 mL

## 2022-11-10 NOTE — ED Notes (Addendum)
**Note De-Identified [place] Obfuscation** Nurse and [employer] were preparing to transport pt to [place], with family at beside. Pt became bradycardiac, lethargic and incoherent with decrease RR. Physician was called and ordered atropine, atropine given. Pt condition did not change and continue to declined, physician advised to contact RT to intubate and place central line. Pt was sedated with 100mg  Succinylcholine at 0025 And Etomidate 34ml 0025 pushed in Right AC. 0027/ intubation time  23 at the lip

## 2022-11-10 NOTE — H&P (Signed)
**Note De-Identified Fayson Obfuscation** NAME:  Hailey Hernandez, MRN:  [id number], DOB:  [birth year], LOS: 0 ADMISSION DATE:  11/23/2022, CONSULTATION DATE:  11/10/22 REFERRING MD:  Betsey Holiday, CHIEF COMPLAINT:  ams   History of Present Illness:  67 yo woman with hx of cirrhosis, here with AMS, elevated troponin, concern for NSTEMI.  NV x 2 weeks.  3.7 L removed Ker[place] paracentesis yesterday.  Prior to paracentesis, sleepy, unwell, vague abdominal pain, nausea.   Sx worsened after procedure.  Nausea, no po intake.   + mild chest pain, dyspnea. Diarrhea and some abdominal pain recently. Was undergoing a w/u with GI (Seen 2/6)  Was admitted by hospitalist to [facility] initially.  Cardiology consult.  Later developed hypotension, bradycardia, obtunded.  Intubated.  Art and central line placed  Started on heparin infusion for NSTEMI.   In ED ABG 7.14/59/262 Ceftriaxone 2gm  Heparin  2L NS  Cr 3.84 (nl 0.98) AP 148 AST alt nl  Alb 3 Bili 0.8  Trop 2474 --> 2308 Lactic normal   Hb 9.2/31.4 Cdif 2/7 neg  Ascitic fluid PMN s 109 Echo 4/18:  Left ventricle: The cavity size was normal. Wall thickness was    normal. Systolic function was normal. The estimated ejection    fraction was in the range of 55% to 60%. There is hypokinesis of    the basalinferior myocardium. Doppler parameters are consistent    with abnormal left ventricular relaxation (grade 1 diastolic    dysfunction).  - Mitral valve: Calcified annulus. Mildly thickened leaflets .  - Left atrium: The atrium was mildly dilated.   Impressions:   - Inferobasal hypokinesis with overall preserved LV systolic    function; mild diastolic dysfunction; MAC without significant MR;    mild LAE.    Pertinent  Medical History  Cirrhosis (unk cause, med side effect per family) GERD HLD OSA on cpap Hx lung ca CAD HTN Depression COPD On home O2 4L   Home meds: albuterol, asa, atorvastatin, breztri, colchicine, fluoxetine, fluticasone, monteleukast, zofran, tremfya,  doxepin, fexofenadine   Significant Hospital Events: Including procedures, antibiotic start and stop dates in addition to other pertinent events     Interim History / Subjective:    Objective   Blood pressure 131/67, pulse 63, temperature (!) 97.4 F (36.3 C), resp. rate 18, height 5\' 2"  (1.575 m), weight 87 kg, SpO2 100 %.    Vent Mode: PRVC FiO2 (%):  [50 %-100 %] 50 % Set Rate:  [18 bmp] 18 bmp Vt Set:  [400 mL] 400 mL PEEP:  [5 cmH20] 5 cmH20   Intake/Output Summary (Last 24 hours) at 11/10/2022 0210 Last data filed at 11/10/2022 0148 Gross per 24 hour  Intake 1000 ml  Output --  Net 1000 ml   Filed Weights   11/11/2022 2337  Weight: 87 kg    Examination: General:intubated, sedated  HENT: ncat   Lungs: ctab  Cardiovascular: rrr no mgr  Abdomen: nt, distented, nbs  Extremities:  Neuro:  GU:   Resolved Hospital Problem list     Assessment & Plan:  67 yo woman with hx of cirrhosis and mmp,  Here with AMS, hypotension, bradycardia, NSTEMI.  \  AMS: unclear source.  Ammonia nl.   Consider infection though no obvious source.   Some N/V/abdominal discomfort, paracentesis yesterday but no findings of SBP.   CT ch/ab/pelv pending.   CT head pending.  Cont broad spectrum abtx for PNA coverage.    Brady/hypotension: unclear cause.  Possibly resp acidosis related. **Note De-Identified [name] Obfuscation** Resp acidosis:  Recheck  abg.   NSTEMI: recheck EKG now. ON heparin.  Held for procedures and for neck hematoma after line placed.   \  AKI: monitor   Best Practice (right click and "Reselect all SmartList Selections" daily)   Diet/type: NPO DVT prophylaxis: systemic heparin GI prophylaxis: PPI Lines: Central line Foley:  Yes, and it is still needed Code Status:  full code Last date of multidisciplinary goals of care discussion []   Labs   CBC: Recent Labs  Lab 11/06/22 1631 11/17/2022 2140  WBC 5.8 7.6  NEUTROABS  --  6.1  HGB 8.9 Repeated and verified X2.* 9.2*  HCT 27.4* 31.4*  MCV  100.7* 110.2*  PLT 90.0* 145*    Basic Metabolic Panel: Recent Labs  Lab 11/06/22 1631 11/08/2022 2140  NA 139 139  K 4.3 4.5  CL 106 107  CO2 24 23  GLUCOSE 100* 104*  BUN 39* 64*  CREATININE 1.96* 3.84*  CALCIUM 8.1* 8.0*   GFR: Estimated Creatinine Clearance: 14.4 mL/min (A) (by C-G formula based on SCr of 3.84 mg/dL (H)). Recent Labs  Lab 11/06/22 1631 11/20/2022 2140 11/15/2022 2357  WBC 5.8 7.6  --   LATICACIDVEN  --  1.2 1.7    Liver Function Tests: Recent Labs  Lab 11/06/22 1631 11/08/2022 2140  AST 24 35  ALT 12 18  ALKPHOS 136* 148*  BILITOT 0.7 0.8  PROT 7.1 7.9  ALBUMIN 3.0* 3.0*   No results for input(s): "LIPASE", "AMYLASE" in the last 168 hours. Recent Labs  Lab 11/07/2022 2140  AMMONIA 17    ABG    Component Value Date/Time   PHART 7.14 (LL) 11/10/2022 0124   PCO2ART 59 (H) 11/10/2022 0124   PO2ART 262 (H) 11/10/2022 0124   HCO3 20.1 11/10/2022 0124   TCO2 31 10/30/2011 0836   ACIDBASEDEF 9.5 (H) 11/10/2022 0124   O2SAT 100 11/10/2022 0124     Coagulation Profile: Recent Labs  Lab 11/20/2022 2140  INR 1.2    Cardiac Enzymes: No results for input(s): "CKTOTAL", "CKMB", "CKMBINDEX", "TROPONINI" in the last 168 hours.  HbA1C: HB A1C (BAYER DCA - WAIVED)  Date/Time Value Ref Range Status  09/05/2022 12:52 PM 4.5 (L) 4.8 - 5.6 % Final    Comment:             Prediabetes: 5.7 - 6.4          Diabetes: >6.4          Glycemic control for adults with diabetes: <7.0   10/27/2021 03:11 PM 4.7 (L) 4.8 - 5.6 % Final    Comment:             Prediabetes: 5.7 - 6.4          Diabetes: >6.4          Glycemic control for adults with diabetes: <7.0     CBG: No results for input(s): "GLUCAP" in the last 168 hours.  Review of Systems:   Unable to assess  Past Medical History:  She,  has a past medical history of Allergy, Anxiety disorder, Arthritis, Asthma, Cancer ([place]), Chronic bronchitis, Cirrhosis ([place]), Complication of anesthesia, COPD  (chronic obstructive pulmonary disease) ([place]), Depression, Diverticulitis, DJD (degenerative joint disease), Dyspnea, Fibromyalgia, GERD (gastroesophageal reflux disease), Glaucoma, History of thyroid cancer, HTN (hypertension), Hyperlipidemia, Impaired glucose tolerance (05/08/2013), Lung cancer ([place]), OSA (obstructive sleep apnea), Polymyalgia ([place]), Pre-diabetes, Psoriasis, Rhinitis, and Tobacco abuse.   Surgical History:   Past Surgical History:  Procedure **Note De-Identified Twichell Obfuscation** Laterality Date   APPENDECTOMY     BRONCHIAL BIOPSY  01/24/2021   Procedure: BRONCHIAL BIOPSIES;  Surgeon: Garner Nash, DO;  Location: [facility];  Service: Pulmonary;;   BRONCHIAL BRUSHINGS  01/24/2021   Procedure: BRONCHIAL BRUSHINGS;  Surgeon: Garner Nash, DO;  Location: [facility];  Service: Pulmonary;;   BRONCHIAL NEEDLE ASPIRATION BIOPSY  01/24/2021   Procedure: BRONCHIAL NEEDLE ASPIRATION BIOPSIES;  Surgeon: Garner Nash, DO;  Location: [facility];  Service: Pulmonary;;   CARPAL TUNNEL RELEASE  10/30/2011   Procedure: CARPAL TUNNEL RELEASE;  Surgeon: Wynonia Sours, MD;  Location: [place];  Service: Orthopedics;  Laterality: Right;  and mass excision   CHOLECYSTECTOMY     CHOLESTEATOMA EXCISION     right ear   IR PARACENTESIS  11/08/2022   left shoulder spurs     ORIF right lower leg     partial throidectomy     TOTAL ABDOMINAL HYSTERECTOMY     VIDEO BRONCHOSCOPY WITH ENDOBRONCHIAL NAVIGATION N/A 01/24/2021   Procedure: VIDEO BRONCHOSCOPY WITH ENDOBRONCHIAL NAVIGATION;  Surgeon: Garner Nash, DO;  Location: [facility];  Service: Pulmonary;  Laterality: N/A;   VIDEO BRONCHOSCOPY WITH ENDOBRONCHIAL ULTRASOUND N/A 01/24/2021   Procedure: VIDEO BRONCHOSCOPY WITH ENDOBRONCHIAL ULTRASOUND;  Surgeon: Garner Nash, DO;  Location: [facility];  Service: Pulmonary;  Laterality: N/A;     Social History:   reports that she has been smoking cigarettes. She has a 45.00 pack-year smoking history. She  has never used smokeless tobacco. She reports that she does not drink alcohol and does not use drugs.   Family History:  Her family history includes Allergies in her sister; Alpha-1 antitrypsin deficiency in her sister and sister; Asthma in her sister; Cancer in her brother, mother, and sister; Emphysema in her sister and sister; Heart attack in her father, mother, and sister; Heart disease in her father and mother; Hyperlipidemia in her brother, daughter, and mother; Hypertension in her brother, daughter, mother, sister, and son; Tuberculosis in an other family member. There is no history of Breast cancer.   Allergies Allergies  Allergen Reactions   Doxycycline Shortness Of Breath and Other (See Comments)    Drug interaction with Soriatane   Tramadol Shortness Of Breath    Did not work   Milnacipran     REACTION: dizzy   Apremilast Rash    Rash, able to take this currently     Home Medications  Prior to Admission medications   Medication Sig Start Date End Date Taking? Authorizing Provider  albuterol (PROVENTIL) (2.5 MG/3ML) 0.083% nebulizer solution Take 3 mLs (2.5 mg total) by nebulization every 4 (four) hours as needed for wheezing or shortness of breath. DX: 496 06/06/22   Gottschalk, Ashly M, DO  albuterol (VENTOLIN HFA) 108 (90 Base) MCG/ACT inhaler Inhale 2 puffs every 4-6 hors as needed- rescue 12/14/21   Baird Lyons D, MD  aspirin EC 81 MG tablet Take 81 mg by mouth daily.    [provider]  atorvastatin (LIPITOR) 80 MG tablet Take 1 tablet (80 mg total) by mouth daily. 10/27/21   [name], DO  Budeson-Glycopyrrol-Formoterol (BREZTRI AEROSPHERE) 160-9-4.8 MCG/ACT AERO Inhale 2 puffs into the lungs 2 (two) times daily. 12/14/21   Baird Lyons D, MD  calcium carbonate (CALTRATE 600) 1500 (600 Ca) MG TABS tablet Take 1 tablet (1,500 mg total) by mouth 2 (two) times daily with a meal. 08/12/20   Ronnie Doss M, DO  colchicine 0.6 MG **Note De-Identified Sessums Obfuscation** tablet Take 1 tablet (0.6  mg total) by mouth daily. 10/27/21   [name], DO  FLUoxetine (PROZAC) 20 MG capsule Take 1 capsule (20 mg total) by mouth daily. 12/08/21   Ronnie Doss M, DO  fluticasone (FLONASE) 50 MCG/ACT nasal spray USE 2 SPRAYS IN EACH NOSTRIL ONCE DAILY 03/19/22   Ronnie Doss M, DO  lidocaine (LIDODERM) 5 % Place 1 patch onto the skin daily. Remove & Discard patch within 12 hours or as directed by MD 09/05/22   [name], DO  lisinopril (ZESTRIL) 20 MG tablet Take 1 tablet (20 mg total) by mouth daily. 10/27/21   Ronnie Doss M, DO  montelukast (SINGULAIR) 10 MG tablet TAKE 1 TABLET EVERY DAY 04/27/22   Ronnie Doss M, DO  Nebulizers (COMPRESSOR/NEBULIZER) MISC Use up to 4 times daily when needed 02/05/11   Baird Lyons D, MD  ondansetron (ZOFRAN-ODT) 4 MG disintegrating tablet TAKE 1 TABLET EVERY 8 HOURS AS NEEDED FOR NAUSEA OR VOMITING 10/30/22   Ronnie Doss M, DO  OXYGEN Inhale into the lungs. 4L    [provider]  Respiratory Therapy Supplies (FLUTTER) DEVI Patient to use device for ten minutes, three times daily 10/20/18   Fenton Foy, NP  TREMFYA 100 MG/ML SOPN Inject into the skin. 11/03/21   [provider]  doxepin (SINEQUAN) 10 MG capsule   12/10/17  [provider]  fexofenadine (ALLEGRA) 180 MG tablet Take 180 mg by mouth daily.    12/10/17  [provider]     Critical care time: 83min       \

## 2022-11-10 NOTE — Progress Notes (Signed)
**Note De-Identified Dougal Obfuscation** [place] for heparin Indication: chest pain/ACS  Allergies  Allergen Reactions   Doxycycline Shortness Of Breath and Other (See Comments)    Drug interaction with Soriatane   Tramadol Shortness Of Breath    Did not work   Milnacipran     REACTION: dizzy   Apremilast Rash    Rash, able to take this currently    Patient Measurements: Height: 5\' 2"  (157.5 cm) Weight: 83.7 kg (184 lb 8.4 oz) IBW/kg (Calculated) : 50.1 Heparin Dosing Weight: 57kg  Vital Signs: Temp: 98.2 F (36.8 C) (02/10 2000) Temp Source: Oral (02/10 2000) BP: 90/36 (02/10 2130) Pulse Rate: 92 (02/10 2130)  Labs: Recent Labs    11/16/2022 2140 11/22/2022 2352 11/10/22 0431 11/10/22 0445 11/10/22 0635 11/10/22 0934 11/10/22 0936 11/10/22 1154 11/10/22 1558 11/10/22 2105 11/10/22 2133  HGB 9.2*  --  8.4*   < > 9.2* 8.8*  --   --   --   --  9.2*  HCT 31.4*  --  28.6*   < > 27.0* 26.0*  --   --   --   --  27.0*  PLT 145*  --  184  --   --   --   --   --   --   --   --   LABPROT 15.4*  --   --   --   --   --   --   --   --   --   --   INR 1.2  --   --   --   --   --   --   --   --   --   --   HEPARINUNFRC  --   --   --   --   --   --   --  <0.10*  --  <0.10*  --   CREATININE 3.84*  --  4.47*  --   --   --   --   --  4.71*  --   --   TROPONINIHS 2,474* 2,308*  --   --   --   --  4,599* 5,554*  --   --   --    < > = values in this interval not displayed.     Estimated Creatinine Clearance: 11.5 mL/min (A) (by C-G formula based on SCr of 4.71 mg/dL (H)).   Medical History: Past Medical History:  Diagnosis Date   Allergy    Anxiety disorder    Arthritis    bil legs   Asthma    Cancer ([place])    Chronic bronchitis    Cirrhosis ([facility])    Complication of anesthesia    slow to wake up, one time she turned blue in recovery   COPD (chronic obstructive pulmonary disease) (H[facility])    Depression    Diverticulitis    DJD (degenerative joint disease)    spine    Dyspnea    O2 at 3 L 24/7   Fibromyalgia    GERD (gastroesophageal reflux disease)    Glaucoma    History of thyroid cancer    HTN (hypertension)    Hyperlipidemia    Impaired glucose tolerance 05/08/2013   Lung cancer (H[facility])    OSA (obstructive sleep apnea)    CPAP  last sleep study 8-10 yr.ag0   Polymyalgia ([place])    Pre-diabetes    Psoriasis    Rhinitis    Tobacco abuse **Note De-Identified Kassabian Obfuscation** Assessment: 72 yoF admitted with AMS and elevated troponin. Pt started on IV heparin with concern for NSTEMI. No AC PTA noted.   Heparin level is <0.1 on an infusion of 900 units/hr. CBC low but stable this am.  Goal of Therapy:  Heparin level 0.3-0.7 units/ml Monitor platelets by anticoagulation protocol: Yes   Plan:  Heparin rebolus 2500 units x1 Increase heparin infusion to 1150 units/h Recheck heparin level in [place], PharmD, [place] Clinical Pharmacist Please see AMION for all Pharmacists' Contact Phone Numbers 11/10/2022, 9:53 PM

## 2022-11-10 NOTE — ED Provider Notes (Signed)
**Note De-Identified Dede Obfuscation** 12:31 AM Asked by Dr. Betsey Holiday to order Fentanyl drip.  Orders placed per management of mechanical ventilation order set.   Montine Circle, PA-C 11/10/22 0032    Orpah Greek, MD 11/10/22 [phone]

## 2022-11-10 NOTE — ED Notes (Signed)
**Note De-Identified [place] Obfuscation** [facility] contacted

## 2022-11-10 NOTE — TOC CM/SW Note (Signed)
**Note De-identified Quade Obfuscation** ..  **Note De-Identified Heinzelman Obfuscation** Transition of Care [facility]) Screening Note   Patient Details  Name: Hailey Hernandez Date of Birth: 12/10/1955   Transition of Care [facility]) CM/SW Contact:    Erenest Rasher, RN Phone Number: [phone] 11/10/2022, 5:56 AM    Transition of Care Department [facility]) has reviewed patient. We will continue to monitor patient advancement through interdisciplinary progression rounds for dc needs.

## 2022-11-10 NOTE — H&P (Signed)
**Note De-Identified Hinze Obfuscation** NAME:  MADOLIN TWADDLE, MRN:  [id number], DOB:  [birth year], LOS: 0 ADMISSION DATE:  11/07/2022, CONSULTATION DATE:  11/10/22 REFERRING MD:  Betsey Holiday, CHIEF COMPLAINT:  ams   History of Present Illness:   67 yo woman with hx of cirrhosis, here with AMS, elevated troponin, concern for NSTEMI.  NV x 2 weeks.  3.7 L removed [place] paracentesis yesterday.  Prior to paracentesis, sleepy, unwell, vague abdominal pain, nausea.   Sx worsened after procedure.  Nausea, no po intake.   + mild chest pain, dyspnea. Diarrhea and some abdominal pain recently. Was undergoing a w/u with GI (Seen 2/6)  Was admitted by hospitalist to [facility] initially.  Cardiology consult.  Later developed hypotension, bradycardia, obtunded.  Intubated.  Arterial  and central line placed. Started on heparin infusion for NSTEMI.  Marked mixed acidosis on initial ABG  Pertinent  Medical History  Cirrhosis (unk cause, med side effect per family) GERD HLD OSA on cpap 5/15 at home  Hx lung ca CAD HTN Depression COPD On home O2 4L, followed by [facility].  Home meds: albuterol, asa, atorvastatin, breztri, colchicine, fluoxetine, fluticasone, monteleukast, zofran, tremfya, doxepin, fexofenadine   Significant Hospital Events: Including procedures, antibiotic start and stop dates in addition to other pertinent events   2/9 - intubated for obtundation.   Interim History / Subjective:  According to family she had been in declining health since Thanksgiving with poor self-reported quality of life.   Objective   Blood pressure (!) 105/45, pulse 83, temperature 98.1 F (36.7 C), resp. rate (!) 26, height 5\' 2"  (1.575 m), weight 83.7 kg, SpO2 99 %.    Vent Mode: PRVC FiO2 (%):  [40 %-100 %] 40 % Set Rate:  [18 bmp-26 bmp] 26 bmp Vt Set:  [400 mL] 400 mL PEEP:  [5 cmH20] 5 cmH20 Plateau Pressure:  [18 cmH20-24 cmH20] 18 cmH20   Intake/Output Summary (Last 24 hours) at 11/10/2022 0840 Last data filed at 11/10/2022 0600 Gross per  24 hour  Intake 1743.51 ml  Output 100 ml  Net 1643.51 ml    Filed Weights   11/20/2022 2337 11/10/22 0645  Weight: 87 kg 83.7 kg    Examination: General:intubated, sedated, frail appearing woman, looks much older than stated age.  HENT: OGT and ETT in place. No scleral icterus.   Lungs: clear bilaterally with no wheezing.  Cardiovascular: delayed capillary refill, wide pulse pressure. 2/6 early CDC murmur over aortic area.  Abdomen: distended but soft with no caput  Extremities: bilateral pretibial edema with chronic stasis dermatitis.  Neuro: opens eyes, tracks to voice, follows commands.  GU: Foley catheter in place with dark tea-colored urine.   Ancillary tests personally reviewed  C diff negative.  7.2/52/182 CXR/CT show post radiation changes, no clear infiltrates No acute intrabdominal process WBC 7.6 Ascitic fluid not consistent with SBP EKG shows NS ST changes.  Creatinine 4.47  Assessment & Plan:   Critically ill due to acute hypoxic and hypercarbic respiratory failure requiring mechanical ventilation Chronic hypoxic respiratory failure due to COPD/OSA  NSCLC status post chemoradiotherapy. NSTEMI - likely type 2 demand injury  AKI on CKD IIIB, likely hypovolemic component Hepatic cirrhosis likely secondary to NASH with portal hypertension, [place]ine Eng B  Plan:  - No clear intercurrent illness, suspect progression of underlying, and irreversible chronic diseases, mainly COPD. - Follow troponin, repeat EKG, echocardiogram to assess both LV and RV function.  - Gentle rehydration, repeat BMET, follow urine output.  - Continue full ventilatory support **Note De-Identified Solana Obfuscation** today, recheck ABG. Start SBT tomorrow.  - Cirrhosis appears well compensated at this time.  - Long conversation with patient's daughter. Goal was to set realistic expectations regarding outlook for recovery from critical illness in terms of both survival and quality of life. Suggested to them that unless we can reverse  respiratory failure and renal failure in the next few days, a transition to comfort care maybe most appropriate.  - Palliative care consultation, even if she survives current illness, will still need and EOL care plan.   Best Practice (right click and "Reselect all SmartList Selections" daily)   Diet/type: NPO start tube feeds.  DVT prophylaxis: systemic heparin GI prophylaxis: PPI Lines: Central line Foley:  Yes, and it is still needed Code Status:  full code Last date of multidisciplinary goals of care discussion [updated 2/10]  CRITICAL CARE Performed by: Kipp Brood   Total critical care time: 50 minutes  Critical care time was exclusive of separately billable procedures and treating other patients.  Critical care was necessary to treat or prevent imminent or life-threatening deterioration.  Critical care was time spent personally by me on the following activities: development of treatment plan with patient and/or surrogate as well as nursing, discussions with consultants, evaluation of patient's response to treatment, examination of patient, obtaining history from patient or surrogate, ordering and performing treatments and interventions, ordering and review of laboratory studies, ordering and review of radiographic studies, pulse oximetry, re-evaluation of patient's condition and participation in multidisciplinary rounds.  Kipp Brood, MD [facility] ICU Physician [place]  Pager: [phone] Mobile: [phone] After hours: [phone].

## 2022-11-10 NOTE — Progress Notes (Signed)
**Note De-Identified Shor Obfuscation** [place] for heparin Indication: chest pain/ACS  Allergies  Allergen Reactions   Doxycycline Shortness Of Breath and Other (See Comments)    Drug interaction with Soriatane   Tramadol Shortness Of Breath    Did not work   Milnacipran     REACTION: dizzy   Apremilast Rash    Rash, able to take this currently    Patient Measurements: Height: 5\' 2"  (157.5 cm) Weight: 83.7 kg (184 lb 8.4 oz) IBW/kg (Calculated) : 50.1 Heparin Dosing Weight: 57kg  Vital Signs: Temp: 99 F (37.2 C) (02/10 1136) Temp Source: Oral (02/10 1136) BP: 107/45 (02/10 1215) Pulse Rate: 94 (02/10 1215)  Labs: Recent Labs    11/23/2022 2140 11/28/2022 2352 11/10/22 0431 11/10/22 0445 11/10/22 0635 11/10/22 0934 11/10/22 0936 11/10/22 1154  HGB 9.2*  --  8.4* 9.5* 9.2* 8.8*  --   --   HCT 31.4*  --  28.6* 28.0* 27.0* 26.0*  --   --   PLT 145*  --  184  --   --   --   --   --   LABPROT 15.4*  --   --   --   --   --   --   --   INR 1.2  --   --   --   --   --   --   --   HEPARINUNFRC  --   --   --   --   --   --   --  <0.10*  CREATININE 3.84*  --  4.47*  --   --   --   --   --   TROPONINIHS 2,474* 2,308*  --   --   --   --  4,599*  --      Estimated Creatinine Clearance: 12.1 mL/min (A) (by C-G formula based on SCr of 4.47 mg/dL (H)).   Medical History: Past Medical History:  Diagnosis Date   Allergy    Anxiety disorder    Arthritis    bil legs   Asthma    Cancer ([place])    Chronic bronchitis    Cirrhosis (HCC)    Complication of anesthesia    slow to wake up, one time she turned blue in recovery   COPD (chronic obstructive pulmonary disease) (HCC)    Depression    Diverticulitis    DJD (degenerative joint disease)    spine   Dyspnea    O2 at 3 L 24/7   Fibromyalgia    GERD (gastroesophageal reflux disease)    Glaucoma    History of thyroid cancer    HTN (hypertension)    Hyperlipidemia    Impaired glucose tolerance 05/08/2013   Lung cancer  (HCC)    OSA (obstructive sleep apnea)    CPAP  last sleep study 8-10 yr.ag0   Polymyalgia ([place])    Pre-diabetes    Psoriasis    Rhinitis    Tobacco abuse      Assessment: 57 yoF admitted with AMS and elevated troponin. Pt started on IV heparin with concern for NSTEMI. No AC PTA noted.   Heparin paused briefly overnight for lines and imaging, since resumed. Heparin level is <0.1 - no infusion issues per RN and no bleeding issues to note. CBC low but stable this am.   Goal of Therapy:  Heparin level 0.3-0.7 units/ml Monitor platelets by anticoagulation protocol: Yes   Plan:  Heparin bolus 2500 units x1 Increase heparin infusion **Note De-Identified [place] Obfuscation** to 900 units/h Recheck heparin level in 8h  Arrie Senate, PharmD, [place], [facility] Clinical Pharmacist [phone] Please check AMION for all [place] numbers 11/10/2022

## 2022-11-10 NOTE — Progress Notes (Signed)
**Note De-Identified Zeien Obfuscation** ABG collected and send down to lab for analysis. Lab notified.

## 2022-11-10 NOTE — Progress Notes (Addendum)
**Note De-Identified Domanski Obfuscation** [facility] Progress Note Patient Name: Hailey Hernandez DOB: March 30, 1956 MRN: [id number]   Date of Service  11/10/2022  HPI/Events of Note  Called for progressive hypotension, maxed out on norepinephrine at 40 mcg.  No clear fever, no new arrhythmia, no vent dyssynchrony.  eICU Interventions  Add vasopressin  Blood cultures ordered and collected.  No evidence of active bleeding.  Obtain repeat ABG in.   2023 -called by pharmacy to alert that the patient has 1 out of 4 blood cultures that is positive for Staph epidermidis with Mec a resistance.  Likely contaminant, but in the setting of unexplained shock, will dose with vancomycin for now.  Repeat blood cultures.  2217: Refractory metabolic acidosis with progressive shock. Add Bicarb infusion, aiming for pH >7.25  Intervention Category Major Interventions: Hypotension - evaluation and management  Marnell Mcdaniel 11/10/2022, 8:17 PM

## 2022-11-10 NOTE — ED Provider Notes (Signed)
**Note De-Identified Pierre Obfuscation** Patient signed out to me by Dr. Pearline Cables.  Patient evaluated earlier today for mental status changes for 24 hours.  Patient with history of cirrhosis.  Ammonia not elevated.  Patient found to have elevated troponin, suspicious for NSTEMI.  Patient on heparin.  Blood pressures have been low.  Alerted by nursing staff that patient's blood pressure has dropped further, into the 70s and heart rate is 30.  Physical Exam  BP 131/67   Pulse (!) 104   Temp 97.8 F (36.6 C) (Oral)   Resp 19   Ht 5\' 2"  (1.575 m)   Wt 87 kg   SpO2 100%   BMI 35.08 kg/m   Physical Exam Constitutional:      General: She is in acute distress.  HENT:     Head: Atraumatic.  Eyes:     Pupils: Pupils are equal, round, and reactive to light.  Cardiovascular:     Rate and Rhythm: Bradycardia present.  Pulmonary:     Breath sounds: Normal breath sounds.  Abdominal:     General: There is distension.  Skin:    General: Skin is warm and dry.  Neurological:     Mental Status: She is lethargic.     Procedures  Procedure Name: Intubation Date/Time: 11/10/2022 1:53 AM  Performed by: Orpah Greek, MDPre-anesthesia Checklist: Patient identified, Patient being monitored, Emergency Drugs available, Timeout performed and Suction available Oxygen Delivery Method: Non-rebreather mask Preoxygenation: Pre-oxygenation with 100% oxygen Induction Type: Rapid sequence Ventilation: Mask ventilation without difficulty Laryngoscope Size: Glidescope and 3 Grade View: Grade I Tube size: 7.5 mm Number of attempts: 1 Placement Confirmation: ETT inserted through vocal cords under direct vision, CO2 detector and Breath sounds checked- equal and bilateral Secured at: 23 cm Tube secured with: ETT holder Dental Injury: Teeth and Oropharynx as per pre-operative assessment     .Central Line  Date/Time: 11/10/2022 1:00 AM  Performed by: Orpah Greek, MD Authorized by: Orpah Greek, MD   Consent:     Consent obtained:  Verbal   Consent given by:  Spouse   Risks, benefits, and alternatives were discussed: yes     Risks discussed:  Arterial puncture, incorrect placement, nerve damage, bleeding, infection and pneumothorax Universal protocol:    Procedure explained and questions answered to patient or proxy's satisfaction: yes     Site/side marked: yes     Immediately prior to procedure, a time out was called: yes     Patient identity confirmed:  Hospital-assigned identification number Pre-procedure details:    Indication(s): central venous access     Hand hygiene: Hand hygiene performed prior to insertion     Sterile barrier technique: All elements of maximal sterile technique followed     Skin preparation:  Chlorhexidine   Skin preparation agent: Skin preparation agent completely dried prior to procedure   Anesthesia:    Anesthesia method:  Local infiltration   Local anesthetic:  Lidocaine 1% w/o epi Procedure details:    Location:  R internal jugular   Patient position:  Trendelenburg   Procedural supplies:  Triple lumen   Catheter size:  7 Fr   Landmarks identified: yes     Ultrasound guidance: yes     Ultrasound guidance timing: real time     Sterile ultrasound techniques: Sterile gel and sterile probe covers were used     Number of attempts:  2 (vessel canulated but could not pass guide wire on first attemp)   Successful placement: yes   Post-procedure **Note De-Identified Kashani Obfuscation** details:    Post-procedure:  Dressing applied and line sutured   Assessment:  Blood return through all ports, no pneumothorax on x-ray, placement verified by x-ray and free fluid flow   Procedure completion:  Tolerated well, no immediate complications   Complications:  Some hematoma formation occurred, held over the site for 15 minutes after procedure no further bleeding or hematoma ARTERIAL LINE  Date/Time: 11/10/2022 1:55 AM  Performed by: Orpah Greek, MD Authorized by: Orpah Greek, MD   Consent:     Consent obtained:  Emergent situation Universal protocol:    Site/side marked: yes     Immediately prior to procedure, a time out was called: yes     Patient identity confirmed:  Hospital-assigned identification number Indications:    Indications: hemodynamic monitoring and multiple ABGs   Pre-procedure details:    Skin preparation:  Chlorhexidine   Preparation: Patient was prepped and draped in sterile fashion   Anesthesia:    Anesthesia method:  None Procedure details:    Location:  R radial   Allen's test performed: no     Needle gauge:  20 G   Placement technique:  Seldinger   Number of attempts:  1   Transducer: waveform confirmed   Post-procedure details:    Post-procedure:  Biopatch applied, secured with tape and sterile dressing applied   CMS:  Normal   Procedure completion:  Tolerated well, no immediate complications .Critical Care  Performed by: Orpah Greek, MD Authorized by: Orpah Greek, MD   Critical care provider statement:    Critical care time (minutes):  35   Critical care time was exclusive of:  Separately billable procedures and treating other patients   Critical care was necessary to treat or prevent imminent or life-threatening deterioration of the following conditions:  Circulatory failure and CNS failure or compromise   Critical care was time spent personally by me on the following activities:  Development of treatment plan with patient or surrogate, discussions with consultants, evaluation of patient's response to treatment, examination of patient, ordering and review of laboratory studies, ordering and review of radiographic studies, ordering and performing treatments and interventions, pulse oximetry, re-evaluation of patient's condition and review of old charts   I assumed direction of critical care for this patient from another provider in my specialty: yes     Care discussed with: admitting provider     ED Course / MDM   Clinical  Course as of 11/10/22 0149  Fri Nov 09, 2022  2225 Per spouse her BP over last few months has been low- around 88-416 systolic [SG]  6063 Spoke with cardiology, Dr Shellia Carwin, recommend hold anti-hypertensive, start heparin x24 hours, no cath tonight. Will see in consult, recommend send to [facility].  [SG]  2312 Creatinine(!): 3.84 ARF, prior Cr around 2 [SG]  2336 She is on 4LNC typically [SG]  2342 Persistently hypotensive, bradycardic; junctional. Start peripheral levo. Continue IVF [SG]    Clinical Course User Index [SG] Jeanell Sparrow, DO   Medical Decision Making Amount and/or Complexity of Data Reviewed Independent Historian: spouse External Data Reviewed: labs, radiology, ECG and notes. Labs: ordered. Decision-making details documented in ED Course. Radiology: ordered and independent interpretation performed. Decision-making details documented in ED Course.  Risk Prescription drug management. Decision regarding hospitalization.  Critical Care Total time providing critical care: 35 minutes   Alerted by nursing staff that patient was becoming further hypotensive and bradycardic.  Peripheral Levophed has been started but was at **Note De-Identified [place] Obfuscation** 2.5 mics, not effective.  Patient was administered atropine with improvement of her heart rate but blood pressure remained low.  Patient somewhat obtunded, I had concern about her protecting her airway.  This was discussed with family and they did indicate that they wanted intubation, patient is a full code.  Was intubated without difficulty.  Sedation initiated with fentanyl because of her already low blood pressures.  Levophed was titrated up and a central line was placed.  Levophed switched to the central line and blood pressures are improved.  An art line was placed for continuous blood pressure monitoring.  Heparin was stopped prior to procedures.  Heparin held for 1 hour after central line and art line placed.  Discussed with Dr. Duwayne Heck, call for critical  care.  Patient will be transferred to [facility] to the ICU for further treatment.       Orpah Greek, MD 11/10/22 0200

## 2022-11-10 NOTE — ED Notes (Signed)
**Note De-Identified Boye Obfuscation** [facility] given report and 2H

## 2022-11-10 NOTE — Consult Note (Signed)
**Note De-Identified [place] Obfuscation** CARDIOLOGY CONSULT NOTE  Patient ID: Hailey Hernandez MRN: [id number] DOB/AGE: 69-Mar-1957 69 y.o.  Admit date: 11/18/2022 Referring Physician  Dr Lynetta Mare Primary Physician:  [name], DO Reason for Consultation  troponin elevation  Patient ID: Hailey Hernandez, female    DOB: [birth year], 67 y.o.   MRN: [id number]  Chief Complaint  Patient presents with   Nausea   HPI:    Hailey Hernandez  is a 67 y.o. female with past medical history significant for cirrhosis, hypertension, and lung cancer who presented to the ED with SOB. Cardiology was placed on consultation due to elevated troponin. Family at bedside. Patient is intubated and sedated.  Past Medical History:  Diagnosis Date   Allergy    Anxiety disorder    Arthritis    bil legs   Asthma    Cancer ([place])    Chronic bronchitis    Cirrhosis (HCC)    Complication of anesthesia    slow to wake up, one time she turned blue in recovery   COPD (chronic obstructive pulmonary disease) (HCC)    Depression    Diverticulitis    DJD (degenerative joint disease)    spine   Dyspnea    O2 at 3 L 24/7   Fibromyalgia    GERD (gastroesophageal reflux disease)    Glaucoma    History of thyroid cancer    HTN (hypertension)    Hyperlipidemia    Impaired glucose tolerance 05/08/2013   Lung cancer (HCC)    OSA (obstructive sleep apnea)    CPAP  last sleep study 8-10 yr.ag0   Polymyalgia ([place])    Pre-diabetes    Psoriasis    Rhinitis    Tobacco abuse    Past Surgical History:  Procedure Laterality Date   APPENDECTOMY     BRONCHIAL BIOPSY  01/24/2021   Procedure: BRONCHIAL BIOPSIES;  Surgeon: Garner Nash, DO;  Location: [facility];  Service: Pulmonary;;   BRONCHIAL BRUSHINGS  01/24/2021   Procedure: BRONCHIAL BRUSHINGS;  Surgeon: Garner Nash, DO;  Location: [facility];  Service: Pulmonary;;   BRONCHIAL NEEDLE ASPIRATION BIOPSY  01/24/2021   Procedure: BRONCHIAL NEEDLE ASPIRATION BIOPSIES;  Surgeon: Garner Nash,  DO;  Location: [facility];  Service: Pulmonary;;   CARPAL TUNNEL RELEASE  10/30/2011   Procedure: CARPAL TUNNEL RELEASE;  Surgeon: Wynonia Sours, MD;  Location: [facility];  Service: Orthopedics;  Laterality: Right;  and mass excision   CHOLECYSTECTOMY     CHOLESTEATOMA EXCISION     right ear   IR PARACENTESIS  11/08/2022   left shoulder spurs     ORIF right lower leg     partial throidectomy     TOTAL ABDOMINAL HYSTERECTOMY     VIDEO BRONCHOSCOPY WITH ENDOBRONCHIAL NAVIGATION N/A 01/24/2021   Procedure: VIDEO BRONCHOSCOPY WITH ENDOBRONCHIAL NAVIGATION;  Surgeon: Garner Nash, DO;  Location: [facility];  Service: Pulmonary;  Laterality: N/A;   VIDEO BRONCHOSCOPY WITH ENDOBRONCHIAL ULTRASOUND N/A 01/24/2021   Procedure: VIDEO BRONCHOSCOPY WITH ENDOBRONCHIAL ULTRASOUND;  Surgeon: Garner Nash, DO;  Location: [facility];  Service: Pulmonary;  Laterality: N/A;   Social History   Tobacco Use   Smoking status: Every Day    Packs/day: 1.00    Years: 45.00    Total pack years: 45.00    Types: Cigarettes   Smokeless tobacco: Never   Tobacco comments:    Smokes 1 pack a day [facility] 12/14/2021  Substance Use Topics   Alcohol use: **Note De-Identified [place] Obfuscation** No    Family History  Problem Relation Age of Onset   Heart disease Mother    Cancer Mother    Hyperlipidemia Mother    Hypertension Mother    Heart attack Mother    Heart disease Father    Heart attack Father    Emphysema Sister    Hypertension Sister    Heart attack Sister    Emphysema Sister    Alpha-1 antitrypsin deficiency Sister    Alpha-1 antitrypsin deficiency Sister    Allergies Sister    Asthma Sister    Cancer Sister    Hyperlipidemia Daughter    Hypertension Daughter    Cancer Brother    Hyperlipidemia Brother    Hypertension Brother    Hypertension Son    Tuberculosis Other        grandmother   Breast cancer Neg Hx     Marital Status: Married  ROS  Review of Systems  Reason unable to perform ROS: patient  intubated and sedated.   Objective      11/10/2022    9:00 AM 11/10/2022    8:05 AM 11/10/2022    8:00 AM  Vitals with BMI  Systolic 725 366 440  Diastolic 41 45 45  Pulse 89 83 81    Blood pressure (!) 102/41, pulse 89, temperature 98.1 F (36.7 C), resp. rate (!) 26, height 5\' 2"  (1.575 m), weight 83.7 kg, SpO2 97 %.    Physical Exam Vitals reviewed.  Constitutional:      Interventions: She is sedated and intubated.  HENT:     Head: Normocephalic and atraumatic.  Cardiovascular:     Rate and Rhythm: Normal rate and regular rhythm.     Heart sounds: Murmur heard.  Pulmonary:     Effort: She is intubated.     Comments: Mechanical breath sounds Abdominal:     General: Bowel sounds are normal.  Musculoskeletal:     Right lower leg: Edema present.     Left lower leg: Edema present.  Skin:    General: Skin is warm and dry.    Laboratory examination:   Recent Labs    06/05/22 1349 10/23/22 1820 11/06/22 1631 11/08/2022 2140 11/10/22 0431 11/10/22 0445 11/10/22 0635 11/10/22 0934  NA 141  --  139 139  --  142 141 141  K 4.6  --  4.3 4.5  --  4.8 4.7 4.2  CL 100  --  106 107  --   --   --   --   CO2 40*  --  24 23  --   --   --   --   GLUCOSE 108*  --  100* 104*  --   --   --   --   BUN 17  --  39* 64*  --   --   --   --   CREATININE 0.98   < > 1.96* 3.84* 4.47*  --   --   --   CALCIUM 9.1  --  8.1* 8.0*  --   --   --   --   GFRNONAA >60  --   --  12* 10*  --   --   --    < > = values in this interval not displayed.   estimated creatinine clearance is 12.1 mL/min (A) (by C-G formula based on SCr of 4.47 mg/dL (H)).     Latest Ref Rng & Units 11/10/2022    9:34 AM 11/10/2022 **Note De-Identified [place] Obfuscation** 6:35 AM 11/10/2022    4:45 AM  CMP  Sodium 135 - 145 mmol/L 141  141  142   Potassium 3.5 - 5.1 mmol/L 4.2  4.7  4.8       Latest Ref Rng & Units 11/10/2022    9:34 AM 11/10/2022    6:35 AM 11/10/2022    4:45 AM  CBC  Hemoglobin 12.0 - 15.0 g/dL 8.8  9.2  9.5   Hematocrit 36.0 - 46.0  % 26.0  27.0  28.0    Lipid Panel No results for input(s): "CHOL", "TRIG", "LDLCALC", "VLDL", "HDL", "CHOLHDL", "LDLDIRECT" in the last 8760 hours.  HEMOGLOBIN A1C Lab Results  Component Value Date   HGBA1C 4.5 (L) 09/05/2022   TSH No results for input(s): "TSH" in the last 8760 hours. BNP (last 3 results) No results for input(s): "BNP" in the last 8760 hours. Cardiac Panel (last 3 results) Recent Labs    11/22/2022 2140 11/23/2022 2352  TROPONINIHS 2,474* 2,308*     Medications and allergies   Allergies  Allergen Reactions   Doxycycline Shortness Of Breath and Other (See Comments)    Drug interaction with Soriatane   Tramadol Shortness Of Breath    Did not work   Milnacipran     REACTION: dizzy   Apremilast Rash    Rash, able to take this currently     No outpatient medications have been marked as taking for the 11/02/2022 encounter [facility] Encounter).    Scheduled Meds:  atropine       Chlorhexidine Gluconate Cloth  6 each Topical Daily   fentaNYL (SUBLIMAZE) injection  100 mcg Intravenous Once   fentaNYL (SUBLIMAZE) injection  25 mcg Intravenous Once   ipratropium-albuterol  3 mL Nebulization Q4H   mouth rinse  15 mL Mouth Rinse Q2H   pantoprazole (PROTONIX) IV  40 mg Intravenous Q24H   Continuous Infusions:  sodium chloride 10 mL/hr at 11/10/22 0900   sodium chloride     azithromycin Stopped (11/10/22 0738)   cefTRIAXone (ROCEPHIN)  IV     fentaNYL infusion INTRAVENOUS 50 mcg/hr (11/10/22 0900)   heparin 650 Units/hr (11/10/22 0900)   midazolam Stopped (11/10/22 0851)   norepinephrine (LEVOPHED) Adult infusion 28 mcg/min (11/10/22 1000)   propofol (DIPRIVAN) infusion     PRN Meds:.atropine, docusate sodium, fentaNYL, mouth rinse, polyethylene glycol   I/O last 3 completed shifts: In: 1905.1 [I.V.:606.6; IV Piggyback:1298.5] Out: 100 [Emesis/NG output:100] Total I/O In: 321.8 [I.V.:239.2; IV Piggyback:82.6] Out: -   Net IO Since Admission: 2,126.91 mL  [11/10/22 1002]   Radiology:   Imaging results have been reviewed and CT CHEST ABDOMEN PELVIS WO CONTRAST  Result Date: 11/10/2022 CLINICAL DATA:  67 year old female with history of non-small cell lung cancer. Evaluate for metastatic disease. Chest pain and abdominal pain. Renal failure. * Tracking Code: BO * EXAM: CT CHEST, ABDOMEN AND PELVIS WITHOUT CONTRAST TECHNIQUE: Multidetector CT imaging of the chest, abdomen and pelvis was performed following the standard protocol without IV contrast. RADIATION DOSE REDUCTION: This exam was performed according to the departmental dose-optimization program which includes automated exposure control, adjustment of the mA and/or kV according to patient size and/or use of iterative reconstruction technique. COMPARISON:  CT of the abdomen and pelvis 10/23/2022. Chest CT 06/05/2022. FINDINGS: Comment: Today's study is limited for detection and characterization of visceral and/or vascular lesions by lack of IV contrast. CT CHEST FINDINGS Cardiovascular: Heart size is mildly enlarged. There is no significant pericardial fluid, thickening or pericardial calcification. There **Note De-Identified [place] Obfuscation** is aortic atherosclerosis, as well as atherosclerosis of the great vessels of the mediastinum and the coronary arteries, including calcified atherosclerotic plaque in the left main, left anterior descending, left circumflex and right coronary arteries. Calcifications of the aortic valve and mitral annulus. Right internal jugular central venous catheter with tip terminating in the distal superior vena cava. Mediastinum/Nodes: No lymphadenopathy noted in the mediastinal or hilar nodal stations confidently identified on today's noncontrast CT examination. Please note that accurate exclusion of hilar adenopathy is limited on noncontrast CT scans. Nasogastric tube in the esophagus. Esophagus is otherwise unremarkable in appearance. Endotracheal tube terminating 3.8 cm above the carina. No axillary  lymphadenopathy. Lungs/Pleura: Patchy areas of mass-like architectural distortion are again noted in the left upper lung, compatible with evolving postradiation changes. No definite new suspicious appearing pulmonary nodules or masses are noted in other portions of the lungs. Trace bilateral pleural effusions. No acute consolidative airspace disease. Musculoskeletal: There are no aggressive appearing lytic or blastic lesions noted in the visualized portions of the skeleton. CT ABDOMEN PELVIS FINDINGS Hepatobiliary: Liver has a shrunken appearance and nodular contour, suggesting underlying cirrhosis. No discrete cystic or solid hepatic lesions are confidently identified on today's noncontrast CT examination. A few scattered calcified granulomas are also noted in the liver. Status post cholecystectomy. Pancreas: No definite pancreatic mass or peripancreatic fluid collections identified on today's noncontrast CT examination. Spleen: Unremarkable. Adrenals/Urinary Tract: Severe atrophy of the left native kidney again noted. Unenhanced appearance of the right kidney and bilateral adrenal glands is unremarkable. No hydroureteronephrosis. Foley balloon catheter within the lumen of the urinary bladder, which is otherwise nearly completely decompressed around the catheter. Stomach/Bowel: Nasogastric tube extends into the antral pre-pyloric region of the stomach. Unenhanced appearance of the stomach is otherwise unremarkable. No pathologic dilatation of small bowel or colon. Multiple colonic diverticuli are noted, without definite focal surrounding inflammatory changes to clearly indicate the presence of an acute diverticulitis (assessment is limited by presence of ascites). Appendix is not confidently identified may be surgically absent. Vascular/Lymphatic: Aortic atherosclerosis. No definite lymphadenopathy confidently identified in the abdomen or pelvis on today's noncontrast CT examination. Reproductive: Status post  hysterectomy. Ovaries are not confidently identified may be surgically absent or atrophic. Other: Moderate volume of ascites. No pneumoperitoneum. Mild diffuse body wall edema. Musculoskeletal: There are no aggressive appearing lytic or blastic lesions noted in the visualized portions of the skeleton. IMPRESSION: 1. Evolving postradiation changes in the upper left lung related to prior radiation therapy. No definitive findings to suggest metastatic disease in the chest, abdomen or pelvis confidently identified on today's limited noncontrast CT examination. 2. Hepatic cirrhosis with moderate volume of ascites. 3. Colonic diverticulosis. 4. Severe atrophy of the left kidney. 5. Aortic atherosclerosis, in addition to left main and three-vessel coronary artery disease. Please note that although the presence of coronary artery calcium documents the presence of coronary artery disease, the severity of this disease and any potential stenosis cannot be assessed on this non-gated CT examination. Assessment for potential risk factor modification, dietary therapy or pharmacologic therapy may be warranted, if clinically indicated. 6. There are calcifications of the aortic valve and mitral annulus. Echocardiographic correlation for evaluation of potential valvular dysfunction may be warranted if clinically indicated. 7. Additional incidental findings, as above. Electronically Signed   By: Vinnie Langton M.D.   On: 11/10/2022 07:17   CT HEAD WO CONTRAST (5MM)  Result Date: 11/10/2022 CLINICAL DATA:  67 year old female with headache and neurologic deficit. Lung cancer, cirrhosis. EXAM: CT **Note De-Identified [place] Obfuscation** HEAD WITHOUT CONTRAST TECHNIQUE: Contiguous axial images were obtained from the base of the skull through the vertex without intravenous contrast. RADIATION DOSE REDUCTION: This exam was performed according to the departmental dose-optimization program which includes automated exposure control, adjustment of the mA and/or kV according to  patient size and/or use of iterative reconstruction technique. COMPARISON:  Brain MRI 02/07/2021.  Head CT 06/08/2018. FINDINGS: Brain: Cerebral volume remains normal for age No midline shift, ventriculomegaly, mass effect, evidence of mass lesion, intracranial hemorrhage or evidence of cortically based acute infarction. Patchy and confluent bilateral cerebral white matter hypodensity appears similar to 2022 MRI signal changes. And chronic right thalamic lacunar infarct is redemonstrated. Vascular: Calcified atherosclerosis at the skull base. No suspicious intracranial vascular hyperdensity. Skull: No acute osseous abnormality identified. Sinuses/Orbits: Well aerated considering intubation. Chronic right mastoidectomy is stable. Other: Partially visible intubation. Associated fluid in the pharynx. Negative visible orbits and scalp. IMPRESSION: 1. No acute intracranial abnormality. 2. Stable non contrast CT appearance of chronic small vessel disease since a 2022 MRI. 3. Intubated. Electronically Signed   By: Genevie Ann M.D.   On: 11/10/2022 06:40   DG Chest Portable 1 View  Result Date: 11/10/2022 CLINICAL DATA:  Intubation film. EXAM: PORTABLE CHEST 1 VIEW COMPARISON:  Portable chest yesterday at 10:25 p.m. FINDINGS: At 1:44 a.m. ETT interval insertion with tip 2.8 cm from the carina. NGT interval insertion with the intragastric course not filmed. Interval right IJ line placement with tip in the distal SVC. Stable cardiomegaly. There is mild central vascular prominence. No interstitial edema was seen. There are chronic heterogeneous opacities in the left lung apex and chronic left-sided volume loss and elevated left diaphragm. Overlying left apical pleural capping is also unchanged. The remaining lungs clear with no new abnormality. There is a stable mediastinal configuration with heavy calcification in the aortic arch. Osteopenia and thoracic spondylosis. IMPRESSION: 1. ETT tip 2.8 cm from the carina. NGT interval  insertion with the intragastric course not filmed. 2. Right IJ line placement with tip in the distal SVC. 3. Stable cardiomegaly and mild central vascular prominence. 4. Chronic left-sided volume loss and heterogeneous opacities in the left lung apex. No new lung opacity. Electronically Signed   By: Telford Nab M.D.   On: 11/10/2022 02:01   DG Chest Port 1 View  Result Date: 11/03/2022 CLINICAL DATA:  Nausea EXAM: PORTABLE CHEST 1 VIEW COMPARISON:  Chest x-ray 10/23/2022.  CT of the chest 06/05/2022 FINDINGS: Heart is enlarged, unchanged. There is stable elevation of the left hemidiaphragm. There are patchy opacities in the left lung apex with some left apical pleural thickening which appears stable. No new focal lung infiltrate, pleural effusion or pneumothorax. No acute fracture. IMPRESSION: 1. No acute cardiopulmonary process. 2. Stable cardiomegaly. 3. Stable chronic opacities in the left lung apex. Electronically Signed   By: Ronney Asters M.D.   On: 11/02/2022 22:40   IR Paracentesis  Result Date: 11/08/2022 INDICATION: History of end-stage COPD on chronic O2 therapy, lung cancer, decompensated hepatic cirrhosis secondary to NASH. Patient was referred to IR for diagnostic and therapeutic paracentesis with a 5 L max. EXAM: ULTRASOUND GUIDED DIAGNOSTIC AND THERAPEUTIC RIGHT LOWER QUADRANT PARACENTESIS MEDICATIONS: 10 mL 1 % lidocaine COMPLICATIONS: None immediate. PROCEDURE: Informed written consent was obtained from the patient after a discussion of the risks, benefits and alternatives to treatment. A timeout was performed prior to the initiation of the procedure. Initial ultrasound scanning demonstrates a large amount of ascites within the right lower **Note De-Identified [place] Obfuscation** abdominal quadrant. The right lower abdomen was prepped and draped in the usual sterile fashion. 1% lidocaine was used for local anesthesia. Following this, a 19 gauge, 7-cm, Yueh catheter was introduced. An ultrasound image was saved for documentation  purposes. The paracentesis was performed. The catheter was removed and a dressing was applied. The patient tolerated the procedure well without immediate post procedural complication. FINDINGS: A total of approximately 3.6 L of clear, pale yellow fluid was removed. Samples were sent to the laboratory as requested by the clinical team. IMPRESSION: Successful ultrasound-guided paracentesis yielding 3.6 liters of peritoneal fluid. PLAN: If the patient eventually requires >/=2 paracenteses in a 30 day period, candidacy for formal evaluation by the [facility] will be assessed. Read by: Narda Rutherford, AGNP-BC Electronically Signed   By: Markus Daft M.D.   On: 11/08/2022 15:56    Cardiac Studies:     EKG: 11/10/2022: Normal sinus rhythm with IVCD, rate 76 bpm. Nonspecific ST and T wave abnormality  Assessment & Recommendations"   NSTEMI - likely type 2 demand injury  Given AKI on CKD in addition to cirrhosis, this is likely due to demand ischemia. No intervention planned. Echocardiogram ordered Cardiology will sign off, please call or text me at [phone] with any questions or concerns. Discussed with intensivist.  Other medical issues: A/C hypoxic respiratory failure requiring mechanical ventilation  Hepatic cirrhosis likely secondary to NASH with portal hypertension, Childs B  AKI on CKD IIIB, likely hypovolemic component  NSCLC status post chemoradiotherapy  PCCM managing    Floydene Flock, DO, [facility] 11/10/2022, 10:02 AM Office: [phone]

## 2022-11-10 NOTE — Code Documentation (Signed)
**Note De-identified [place] Obfuscation** Family at beside. Family given emotional support. 

## 2022-11-10 NOTE — Progress Notes (Signed)
**Note De-Identified [place] Obfuscation** Pharmacy Antibiotic Note  Hailey Hernandez is a 67 y.o. female admitted on 11/08/2022 with  positive blood culture .  Pharmacy has been consulted for Vancomycin dosing.  Patient with Acute on chronic CKD, Current CRCl - 11 ml/min  Plan: Vanc 1750 mg IV x 1 Then vanc dosing based on levels due to changing renal function Monitor renal functions, levels and cultures and sensitivities.  Height: 5\' 2"  (157.5 cm) Weight: 83.7 kg (184 lb 8.4 oz) IBW/kg (Calculated) : 50.1  Temp (24hrs), Avg:98 F (36.7 C), Min:97.4 F (36.3 C), Max:99.5 F (37.5 C)  Recent Labs  Lab 11/06/22 1631 11/17/2022 2140 11/05/2022 2357 11/10/22 0431 11/10/22 0936 11/10/22 1558  WBC 5.8 7.6  --  12.3*  --   --   CREATININE 1.96* 3.84*  --  4.47*  --  4.71*  LATICACIDVEN  --  1.2 1.7  --  1.0  --     Estimated Creatinine Clearance: 11.5 mL/min (A) (by C-G formula based on SCr of 4.71 mg/dL (H)).    Allergies  Allergen Reactions   Doxycycline Shortness Of Breath and Other (See Comments)    Drug interaction with Soriatane   Tramadol Shortness Of Breath    Did not work   Milnacipran     REACTION: dizzy   Apremilast Rash    Rash, able to take this currently    Thank you for allowing pharmacy to be a part of this patient's care.  Alanda Slim, PharmD, [facility] Clinical Pharmacist Please see AMION for all Pharmacists' Contact Phone Numbers 11/10/2022, 8:29 PM

## 2022-11-10 NOTE — Progress Notes (Addendum)
**Note De-Identified [place] Obfuscation** eLink Physician-Brief Progress Note Patient Name: Hailey Hernandez DOB: [birth year] MRN: [id number]   Date of Service  11/10/2022  HPI/Events of Note  67 year old female with a history of cirrhosis, previous lung cancer, COPD on 4 L, who presented with acute encephalopathy.  She is intubated in the emergency department and transferred to ICU.  Initial workup consistent with inferobasal hypokinesis and likely NSTEMI especially in the setting of elevated troponins.  eICU Interventions  But that eval shows relatively comfortable patient, intermittently moving upper and lower extremities.  Plan for CT examination, otherwise on broad-spectrum antibiotics.  NPO.  Stable on the vent.  Added propofol for CT scan, no other acute intervention is indicated at this time.  Added restraints.   0538 - CT exams fell off at transfer, reordered   Intervention Category Minor Interventions: Agitation / anxiety - evaluation and management Evaluation Type: New Patient Evaluation  Hailey Hernandez 11/10/2022, 4:37 AM

## 2022-11-10 NOTE — ED Notes (Addendum)
**Note De-Identified [place] Obfuscation** Report given to Alphia Moh

## 2022-11-10 NOTE — Progress Notes (Signed)
**Note De-Identified [place] Obfuscation** Called [facility] and gave report about pt going to 2H13.

## 2022-11-10 NOTE — Progress Notes (Signed)
**Note De-Identified [place] Obfuscation** Patient transported to CT and back without any complications.

## 2022-11-10 NOTE — Progress Notes (Addendum)
**Note De-Identified [place] Obfuscation** PHARMACY - PHYSICIAN COMMUNICATION CRITICAL VALUE ALERT - BLOOD CULTURE IDENTIFICATION (BCID)  Hailey Hernandez is an 67 y.o. female who presented to [facility] on 11/01/2022 with a chief complaint of AMS, elevated troponin   Assessment:  1 out of 4 blood cultures positive for staph epi MECA+   Current antibiotics: Azithro /Rocephin  Spoke to Dr. Dorian Pod  Changes to prescribed antibiotics recommended:  Per MD, due to progressive shock will start Vancomycin for now.  Results for orders placed or performed during the hospital encounter of 11/23/2022  Blood Culture ID Panel (Reflexed) (Collected: 11/21/2022  9:31 PM)  Result Value Ref Range   Enterococcus faecalis NOT DETECTED NOT DETECTED   Enterococcus Faecium NOT DETECTED NOT DETECTED   Listeria monocytogenes NOT DETECTED NOT DETECTED   Staphylococcus species DETECTED (A) NOT DETECTED   Staphylococcus aureus (BCID) NOT DETECTED NOT DETECTED   Staphylococcus epidermidis DETECTED (A) NOT DETECTED   Staphylococcus lugdunensis NOT DETECTED NOT DETECTED   Streptococcus species NOT DETECTED NOT DETECTED   Streptococcus agalactiae NOT DETECTED NOT DETECTED   Streptococcus pneumoniae NOT DETECTED NOT DETECTED   Streptococcus pyogenes NOT DETECTED NOT DETECTED   A.calcoaceticus-baumannii NOT DETECTED NOT DETECTED   Bacteroides fragilis NOT DETECTED NOT DETECTED   Enterobacterales NOT DETECTED NOT DETECTED   Enterobacter cloacae complex NOT DETECTED NOT DETECTED   Escherichia coli NOT DETECTED NOT DETECTED   Klebsiella aerogenes NOT DETECTED NOT DETECTED   Klebsiella oxytoca NOT DETECTED NOT DETECTED   Klebsiella pneumoniae NOT DETECTED NOT DETECTED   Proteus species NOT DETECTED NOT DETECTED   Salmonella species NOT DETECTED NOT DETECTED   Serratia marcescens NOT DETECTED NOT DETECTED   Haemophilus influenzae NOT DETECTED NOT DETECTED   Neisseria meningitidis NOT DETECTED NOT DETECTED   Pseudomonas aeruginosa NOT DETECTED NOT DETECTED    Stenotrophomonas maltophilia NOT DETECTED NOT DETECTED   Candida albicans NOT DETECTED NOT DETECTED   Candida auris NOT DETECTED NOT DETECTED   Candida glabrata NOT DETECTED NOT DETECTED   Candida krusei NOT DETECTED NOT DETECTED   Candida parapsilosis NOT DETECTED NOT DETECTED   Candida tropicalis NOT DETECTED NOT DETECTED   Cryptococcus neoformans/gattii NOT DETECTED NOT DETECTED   Methicillin resistance mecA/C DETECTED (A) NOT DETECTED    Corinda Gubler 11/10/2022  7:59 PM

## 2022-11-10 NOTE — Progress Notes (Signed)
**Note De-Identified [place] Obfuscation** Initial Nutrition Assessment  DOCUMENTATION CODES:   Obesity unspecified  INTERVENTION:  Adjust tube feed regimen [place] OGT: -Initiate Vital 1.5 at 20 mL/hour and advance by 10 mL/hour every 8 hours to goal rate of 60 mL/hour (1440 mL goal daily volume) -Provide PROSource TF20 60 mL daily per tube -Provides: 2240 kcal, 117 grams of protein, 1094 mL H2O daily  Monitor magnesium, potassium, and phosphorus BID for at least 4 occurrences, MD to replete as needed, as pt is at risk for refeeding syndrome.   NUTRITION DIAGNOSIS:   Inadequate oral intake related to inability to eat as evidenced by NPO status.  GOAL:   Patient will meet greater than or equal to 90% of their needs  MONITOR:   Vent status, Weight trends, Labs, TF tolerance, I & O's  REASON FOR ASSESSMENT:   Consult Enteral/tube feeding initiation and management  ASSESSMENT:   67 year old female with PMHx of cirrhosis, GERD, HLD, OSA on CPAP, hx lung cancer, CAD, HTN, depression, COPD on home O2 admitted with AMS, elevated troponin, concern for NSTEMI.  2/9: intubated for obtundation  Per review of chart pt had nausea and emesis for 2 weeks. She underwent paracentesis on 2/8 with 3.6 L fluid removed. Prior to paracentesis pt was sleepy, unwell, had abdominal pain and nausea. Symptoms worsened after procedure. Per review of weight history in chart weights have fluctuated between 86-90 kg since May 2023. Current weight is 83.7 kg (184.53 lbs). Pt was 89.8 kg on 10/23/22. That is a weight loss of 6.1 kg or 6.8% weight over approximately 2.5 weeks, which may be significant for time frame if weights are accurate and pending affect of fluid status and paracentesis on weights. Also unsure if current weight is affected by fluid status so will continue to monitor trends.  Patient is currently intubated on ventilator support MV: 10.1 L/min Temp (24hrs), Avg:97.8 F (36.6 C), Min:97.4 F (36.3 C), Max:99 F (37.2 C)  Medications  reviewed and include: pantoprazole, NS at 75 mL/hour, azithromycin, ceftriaxone, fentanyl gtt, heparin, Versed gtt, norepinephrine gtt at 29 mcg/min  Labs reviewed: CBG 118-143, CO2 19, BUN 63, Creatinine 4.71, Phosphorus 5.4  Enteral Access: OGT placed 11/10/22; tube extends into antral pre-pyloric region of stomach per CT chest/abdomen/pelvis 2/10  TF regimen: Vital High Protein at 40 mL/hour + PROSource TF20 60 mL daily  UOP: none documented previous 24 hours  I/O: +1805.1 mL since admission  NUTRITION - FOCUSED PHYSICAL EXAM:  Unable to complete at this time as RD is working remotely.  Diet Order:   Diet Order             Diet NPO time specified  Diet effective now                  EDUCATION NEEDS:   No education needs have been identified at this time  Skin:  Skin Assessment: Reviewed RN Assessment  Last BM:  PTA  Height:   Ht Readings from Last 1 Encounters:  11/10/22 5\' 2"  (1.575 m)   Weight:   Wt Readings from Last 1 Encounters:  11/10/22 83.7 kg   Ideal Body Weight:  50 kg  BMI:  Body mass index is 33.75 kg/m.  Estimated Nutritional Needs:   Kcal:  2100-2300  Protein:  105-120 grams  Fluid:  2.1-2.3 L/day  Hailey Drilling, MS, RD, LDN, CNSC Pager number available on Amion

## 2022-11-11 DIAGNOSIS — I959 Hypotension, unspecified: Secondary | ICD-10-CM | POA: Diagnosis not present

## 2022-11-11 DIAGNOSIS — J9601 Acute respiratory failure with hypoxia: Secondary | ICD-10-CM | POA: Diagnosis not present

## 2022-11-11 DIAGNOSIS — Z7189 Other specified counseling: Secondary | ICD-10-CM

## 2022-11-11 DIAGNOSIS — Z515 Encounter for palliative care: Secondary | ICD-10-CM

## 2022-11-11 DIAGNOSIS — N189 Chronic kidney disease, unspecified: Secondary | ICD-10-CM

## 2022-11-11 DIAGNOSIS — I214 Non-ST elevation (NSTEMI) myocardial infarction: Secondary | ICD-10-CM | POA: Diagnosis not present

## 2022-11-11 DIAGNOSIS — N179 Acute kidney failure, unspecified: Secondary | ICD-10-CM

## 2022-11-11 DIAGNOSIS — J9602 Acute respiratory failure with hypercapnia: Secondary | ICD-10-CM

## 2022-11-11 LAB — COMPREHENSIVE METABOLIC PANEL
ALT: 15 U/L (ref 0–44)
AST: 36 U/L (ref 15–41)
Albumin: 2.9 g/dL — ABNORMAL LOW (ref 3.5–5.0)
Alkaline Phosphatase: 106 U/L (ref 38–126)
Anion gap: 11 (ref 5–15)
BUN: 66 mg/dL — ABNORMAL HIGH (ref 8–23)
CO2: 18 mmol/L — ABNORMAL LOW (ref 22–32)
Calcium: 7.9 mg/dL — ABNORMAL LOW (ref 8.9–10.3)
Chloride: 107 mmol/L (ref 98–111)
Creatinine, Ser: 4.85 mg/dL — ABNORMAL HIGH (ref 0.44–1.00)
GFR, Estimated: 9 mL/min — ABNORMAL LOW (ref >60)
Glucose, Bld: 145 mg/dL — ABNORMAL HIGH (ref 70–99)
Potassium: 4 mmol/L (ref 3.5–5.1)
Sodium: 136 mmol/L (ref 135–145)
Total Bilirubin: 0.9 mg/dL (ref 0.3–1.2)
Total Protein: 6.5 g/dL (ref 6.5–8.1)

## 2022-11-11 LAB — URINALYSIS, ROUTINE W REFLEX MICROSCOPIC
Bilirubin Urine: NEGATIVE
Glucose, UA: NEGATIVE mg/dL
Ketones, ur: NEGATIVE mg/dL
Nitrite: NEGATIVE
Protein, ur: >=300 mg/dL — AB
RBC / HPF: >50 RBC/hpf (ref 0–5)
Specific Gravity, Urine: 1.014 (ref 1.005–1.030)
WBC, UA: >50 WBC/hpf (ref 0–5)
pH: 5 (ref 5.0–8.0)

## 2022-11-11 LAB — CBC
HCT: 26.7 % — ABNORMAL LOW (ref 36.0–46.0)
Hemoglobin: 8.4 g/dL — ABNORMAL LOW (ref 12.0–15.0)
MCH: 33.2 pg (ref 26.0–34.0)
MCHC: 31.5 g/dL (ref 30.0–36.0)
MCV: 105.5 fL — ABNORMAL HIGH (ref 80.0–100.0)
Platelets: 157 10*3/uL (ref 150–400)
RBC: 2.53 MIL/uL — ABNORMAL LOW (ref 3.87–5.11)
RDW: 16.5 % — ABNORMAL HIGH (ref 11.5–15.5)
WBC: 21.9 10*3/uL — ABNORMAL HIGH (ref 4.0–10.5)
nRBC: 0.1 % (ref 0.0–0.2)

## 2022-11-11 LAB — POCT I-STAT 7, (LYTES, BLD GAS, ICA,H+H)
Acid-base deficit: 8 mmol/L — ABNORMAL HIGH (ref 0.0–2.0)
Bicarbonate: 19 mmol/L — ABNORMAL LOW (ref 20.0–28.0)
Calcium, Ion: 1.2 mmol/L (ref 1.15–1.40)
HCT: 25 % — ABNORMAL LOW (ref 36.0–46.0)
Hemoglobin: 8.5 g/dL — ABNORMAL LOW (ref 12.0–15.0)
O2 Saturation: 96 %
Patient temperature: 100.4
Potassium: 4 mmol/L (ref 3.5–5.1)
Sodium: 141 mmol/L (ref 135–145)
TCO2: 20 mmol/L — ABNORMAL LOW (ref 22–32)
pCO2 arterial: 44 mmHg (ref 32–48)
pH, Arterial: 7.248 — ABNORMAL LOW (ref 7.35–7.45)
pO2, Arterial: 96 mmHg (ref 83–108)

## 2022-11-11 LAB — COOXEMETRY PANEL
Carboxyhemoglobin: 1.2 % (ref 0.5–1.5)
Methemoglobin: <0.7 % (ref 0.0–1.5)
O2 Saturation: 84.8 %
Total hemoglobin: 8.3 g/dL — ABNORMAL LOW (ref 12.0–16.0)

## 2022-11-11 LAB — STREP PNEUMONIAE URINARY ANTIGEN: Strep Pneumo Urinary Antigen: NEGATIVE

## 2022-11-11 LAB — TRIGLYCERIDES: Triglycerides: 98 mg/dL (ref <150)

## 2022-11-11 LAB — MAGNESIUM
Magnesium: 1.9 mg/dL (ref 1.7–2.4)
Magnesium: 2.3 mg/dL (ref 1.7–2.4)

## 2022-11-11 LAB — GLUCOSE, CAPILLARY
Glucose-Capillary: 151 mg/dL — ABNORMAL HIGH (ref 70–99)
Glucose-Capillary: 165 mg/dL — ABNORMAL HIGH (ref 70–99)
Glucose-Capillary: 165 mg/dL — ABNORMAL HIGH (ref 70–99)
Glucose-Capillary: 179 mg/dL — ABNORMAL HIGH (ref 70–99)
Glucose-Capillary: 181 mg/dL — ABNORMAL HIGH (ref 70–99)

## 2022-11-11 LAB — HEPARIN LEVEL (UNFRACTIONATED)
Heparin Unfractionated: <0.1 IU/mL — ABNORMAL LOW (ref 0.30–0.70)
Heparin Unfractionated: 0.11 IU/mL — ABNORMAL LOW (ref 0.30–0.70)

## 2022-11-11 LAB — PHOSPHORUS
Phosphorus: 4.8 mg/dL — ABNORMAL HIGH (ref 2.5–4.6)
Phosphorus: 4.5 mg/dL (ref 2.5–4.6)

## 2022-11-11 MED ORDER — HEPARIN BOLUS VIA INFUSION
2500.0000 [IU] | Freq: Once | INTRAVENOUS | Status: AC
  Administered 2022-11-11: 2500 [IU] via INTRAVENOUS
  Filled 2022-11-11: qty 2500

## 2022-11-11 MED ORDER — MAGNESIUM SULFATE 2 GM/50ML IV SOLN
2.0000 g | Freq: Once | INTRAVENOUS | Status: AC
  Administered 2022-11-11: 2 g via INTRAVENOUS
  Filled 2022-11-11: qty 50

## 2022-11-11 MED ORDER — FUROSEMIDE 10 MG/ML IJ SOLN
40.0000 mg | Freq: Once | INTRAMUSCULAR | Status: AC
  Administered 2022-11-11: 40 mg via INTRAVENOUS
  Filled 2022-11-11: qty 4

## 2022-11-11 MED ORDER — DOBUTAMINE IN D5W 4-5 MG/ML-% IV SOLN
2.5000 ug/kg/min | INTRAVENOUS | Status: DC
  Administered 2022-11-11 – 2022-11-14 (×2): 2.5 ug/kg/min via INTRAVENOUS
  Filled 2022-11-11 (×2): qty 250

## 2022-11-11 MED ORDER — HEPARIN BOLUS VIA INFUSION
2000.0000 [IU] | Freq: Once | INTRAVENOUS | Status: AC
  Administered 2022-11-11: 2000 [IU] via INTRAVENOUS
  Filled 2022-11-11: qty 2000

## 2022-11-11 NOTE — Progress Notes (Signed)
**Note De-Identified [place] Obfuscation** [place] for heparin Indication: chest pain/ACS  Allergies  Allergen Reactions   Doxycycline Shortness Of Breath and Other (See Comments)    Drug interaction with Soriatane   Tramadol Shortness Of Breath    Did not work   Milnacipran     REACTION: dizzy   Apremilast Rash    Rash, able to take this currently    Patient Measurements: Height: 5\' 2"  (157.5 cm) Weight: 86.8 kg (191 lb 5.8 oz) IBW/kg (Calculated) : 50.1 Heparin Dosing Weight: 57kg  Vital Signs: Temp: 99.8 F (37.7 C) (02/11 1105) Temp Source: Axillary (02/11 1105) BP: 95/47 (02/11 1400) Pulse Rate: 106 (02/11 1400)  Labs: Recent Labs    11/14/2022 2140 11/23/2022 2352 11/10/22 0431 11/10/22 0445 11/10/22 0936 11/10/22 1154 11/10/22 1154 11/10/22 1558 11/10/22 2105 11/10/22 2133 11/11/22 0245 11/11/22 0608 11/11/22 0758 11/11/22 1405  HGB 9.2*  --  8.4*   < >  --   --   --   --   --  9.2* 8.4*  --  8.5*  --   HCT 31.4*  --  28.6*   < >  --   --   --   --   --  27.0* 26.7*  --  25.0*  --   PLT 145*  --  184  --   --   --   --   --   --   --  157  --   --   --   LABPROT 15.4*  --   --   --   --   --   --   --   --   --   --   --   --   --   INR 1.2  --   --   --   --   --   --   --   --   --   --   --   --   --   HEPARINUNFRC  --   --   --   --   --  <0.10*   < >  --  <0.10*  --   --  <0.10*  --  0.11*  CREATININE 3.84*  --  4.47*  --   --   --   --  4.71*  --   --  4.85*  --   --   --   TROPONINIHS 2,474* 2,308*  --   --  4,599* 5,554*  --   --   --   --   --   --   --   --    < > = values in this interval not displayed.     Estimated Creatinine Clearance: 11.4 mL/min (A) (by C-G formula based on SCr of 4.85 mg/dL (H)).   Medical History: Past Medical History:  Diagnosis Date   Allergy    Anxiety disorder    Arthritis    bil legs   Asthma    Cancer ([place])    Chronic bronchitis    Cirrhosis (HCC)    Complication of anesthesia    slow to wake up, one  time she turned blue in recovery   COPD (chronic obstructive pulmonary disease) (HCC)    Depression    Diverticulitis    DJD (degenerative joint disease)    spine   Dyspnea    O2 at 3 L 24/7   Fibromyalgia    GERD (gastroesophageal reflux disease) **Note De-Identified [place] Obfuscation** Glaucoma    History of thyroid cancer    HTN (hypertension)    Hyperlipidemia    Impaired glucose tolerance 05/08/2013   Lung cancer (HCC)    OSA (obstructive sleep apnea)    CPAP  last sleep study 8-10 yr.ag0   Polymyalgia ([place])    Pre-diabetes    Psoriasis    Rhinitis    Tobacco abuse      Assessment: 1 yoF admitted with AMS and elevated troponin. Pt started on IV heparin with concern for NSTEMI. No AC PTA noted.   Heparin level remains subtherapeutic at 0.11, no infusion or bleeding issues per RN.   Goal of Therapy:  Heparin level 0.3-0.7 units/ml Monitor platelets by anticoagulation protocol: Yes   Plan:  Heparin bolus 2500 units x1 Increase heparin infusion to 1600 units/h Recheck heparin level in 8h  Arrie Senate, PharmD, [place], [place] Pharmacist [phone] Please check AMION for all [facility] Pharmacy numbers 11/11/2022

## 2022-11-11 NOTE — Progress Notes (Signed)
**Note De-Identified [place] Obfuscation** [place] for heparin Indication: chest pain/ACS Brief A/P: Heparin level subtherapeutic Increase Heparin rate  Allergies  Allergen Reactions   Doxycycline Shortness Of Breath and Other (See Comments)    Drug interaction with Soriatane   Tramadol Shortness Of Breath    Did not work   Milnacipran     REACTION: dizzy   Apremilast Rash    Rash, able to take this currently    Patient Measurements: Height: 5\' 2"  (157.5 cm) Weight: 86.8 kg (191 lb 5.8 oz) IBW/kg (Calculated) : 50.1 Heparin Dosing Weight: 70 kg  Vital Signs: Temp: 98.8 F (37.1 C) (02/11 0000) Temp Source: Axillary (02/11 0400) BP: 116/41 (02/11 0500) Pulse Rate: 101 (02/11 0530)  Labs: Recent Labs    11/02/2022 2140 11/24/2022 2352 11/10/22 0431 11/10/22 0445 11/10/22 0934 11/10/22 0936 11/10/22 1154 11/10/22 1558 11/10/22 2105 11/10/22 2133 11/11/22 0245 11/11/22 0608  HGB 9.2*  --  8.4*   < > 8.8*  --   --   --   --  9.2* 8.4*  --   HCT 31.4*  --  28.6*   < > 26.0*  --   --   --   --  27.0* 26.7*  --   PLT 145*  --  184  --   --   --   --   --   --   --  157  --   LABPROT 15.4*  --   --   --   --   --   --   --   --   --   --   --   INR 1.2  --   --   --   --   --   --   --   --   --   --   --   HEPARINUNFRC  --   --   --   --   --   --  <0.10*  --  <0.10*  --   --  <0.10*  CREATININE 3.84*  --  4.47*  --   --   --   --  4.71*  --   --  4.85*  --   TROPONINIHS 2,474* 2,308*  --   --   --  4,599* 5,554*  --   --   --   --   --    < > = values in this interval not displayed.     Estimated Creatinine Clearance: 11.4 mL/min (A) (by C-G formula based on SCr of 4.85 mg/dL (H)).  Assessment: 67 y.o. female with NSTEMI for heparin  Goal of Therapy:  Heparin level 0.3-0.7 units/ml Monitor platelets by anticoagulation protocol: Yes   Plan:  Heparin 2000 units IV bolus, then increase heparin 1350 units/hr Check heparin level in 8 hours.   Phillis Knack, PharmD,  BCPS  11/11/2022, 6:38 AM

## 2022-11-11 NOTE — Consult Note (Signed)
**Note De-Identified [place] Obfuscation** Palliative Care Consult Note                                  Date: 11/11/2022   Patient Name: Hailey Hernandez  DOB: 1956/08/18  MRN: [id number]  Age / Sex: 67 y.o., female  PCP: [name], DO Referring Physician: Kipp Brood, MD  Reason for Consultation: Establishing goals of care  HPI/Patient Profile: 67 y.o. female  with past medical history of  cirrhosis, COPD on 4L home oxygen, non-small cell lung cancer s/p chemotherapy in 2022, OSA on cpap, CAD, HTN, and HLD who presented to the ED on 11/11/2022 with altered mental status. She was found to have elevated troponin with concern for STEMI. Intubated in the ED. Admitted to PCCM with acute on chronic respiratory failure.   Palliative Medicine was consulted for goals of care.    Subjective:   I have reviewed medical records including progress notes, labs and imaging, received and update from the RN, and assessed the patient at bedside. She is requiring pressor support with Levophed and vasopressin. Per RN, she intermittently follows commands.  I met with spouse/Hailey Hernandez, daughter/Hailey Hernandez, and son/Hailey Hernandez in the [place] conference room to discuss diagnosis, prognosis, goals of care.  I introduced Palliative Medicine as specialized medical care for people living with serious illness. It focuses on providing relief from the symptoms and stress of a serious illness.   A brief life review was discussed.  Hailey Hernandez has lived in the [place] area her entire life.  She and Hailey Hernandez have been married for 49 years (will be 64 years in May).  They have 2 daughters Hailey Hernandez and Hailey Hernandez) and 1 son Hailey Hernandez). Prior to her declining health, family describes Hailey Hernandez as "active".  She loves the beach and the mountains and she loves to cook.  Family reports that Hailey Hernandez's overall health and functional status has been declining since November 2023 (around Thanksgiving). The past several weeks, she has barely been  able to mobilize and has needed help with ADLs.   We discussed patient's current medical situation and what it means in the larger context of her ongoing co-morbidities.  Reviewed that Katheline has multiple chronic medical issues including cirrhosis, COPD, CAD, and OSA.  Natural disease trajectory of chronic illness was discussed.  Current clinical status was reviewed.  We discussed that Idamae is critically ill in multisystem organ failure.  She is in acute respiratory failure requiring mechanical ventilation, has low blood pressure (shock) requiring vasopressors, and with acute kidney injury (creatinine trending up and is 4.85 today).   Discussed the possibility that Hailey Hernandez may not survive this illness. Family is tearful as they process this information.   At this time family wishes to continue current medical interventions with watchful waiting.  They are cautiously hopeful for improvement, but also verbalize understanding of the severity of Hailey Hernandez's illness and that she is at high risk for mortality.    The difference between full scope medical intervention and comfort care was considered.  I introduced the concept of a comfort path, which means de-escalating full scope medical interventions and allowing a natural course to occur. Discussed that the goal is comfort and dignity rather than cure/prolonging life. Encouraged family to consider placing limitations on scope of treatment, keeping in mind the concept of quality of life.   Discussed the importance of continued conversation with family and the medical team regarding overall plan of care and treatment options. **Note De-Identified [place] Obfuscation** Family will make decisions as they arise pending patient's clinical course. Ongoing palliative support offered.    Review of Systems  Unable to perform ROS   Objective:   Primary Diagnoses: Present on Admission:  Hypotension, unspecified hypotension type   Physical Exam Constitutional:      General: She is not  in acute distress.    Appearance: She is ill-appearing.     Interventions: She is intubated.  Pulmonary:     Effort: She is intubated.     Vital Signs:  BP (!) 100/44   Pulse 96   Temp 99.8 F (37.7 C) (Axillary)   Resp (!) 26   Ht 5\' 2"  (1.575 m)   Wt 86.8 kg   SpO2 96%   BMI 35.00 kg/m   Palliative Assessment/Data: PPS 30%     Assessment & Plan:   SUMMARY OF RECOMMENDATIONS   DNR status confirmed with family Continue current interventions  Family is cautiously hopeful for improvement Family will make decisions as they arise pending patient's clinical course PMT will follow-up tomorrow  Primary Decision Maker: Spouse - Hailey Hernandez   Prognosis:  Unable to determine    Thank you for allowing [place] to participate in the care of [name]   MDM - High   Signed by: Hailey Confer, NP Palliative Medicine Team  Team Phone # [phone]  For individual providers, please see AMION

## 2022-11-11 NOTE — Progress Notes (Signed)
**Note De-Identified [place] Obfuscation** NAME:  Hailey Hernandez, MRN:  [id number], DOB:  Apr 16, 1956, LOS: 1 ADMISSION DATE:  11/28/2022, CONSULTATION DATE:  11/10/22 REFERRING MD:  Betsey Holiday, CHIEF COMPLAINT:  ams   History of Present Illness:   67 yo woman with hx of cirrhosis, here with AMS, elevated troponin, concern for NSTEMI.  NV x 2 weeks.  3.7 L removed [place] paracentesis yesterday.  Prior to paracentesis, sleepy, unwell, vague abdominal pain, nausea.   Sx worsened after procedure.  Nausea, no po intake.   + mild chest pain, dyspnea. Diarrhea and some abdominal pain recently. Was undergoing a w/u with GI (Seen 2/6)  Was admitted by hospitalist to [facility] initially.  Cardiology consult.  Later developed hypotension, bradycardia, obtunded.  Intubated.  Arterial  and central line placed. Started on heparin infusion for NSTEMI.  Marked mixed acidosis on initial ABG  Pertinent  Medical History  Cirrhosis (unk cause, med side effect per family) GERD HLD OSA on cpap 5/15 at home  Hx lung ca CAD HTN Depression COPD On home O2 4L, followed by [facility].  Home meds: albuterol, asa, atorvastatin, breztri, colchicine, fluoxetine, fluticasone, monteleukast, zofran, tremfya, doxepin, fexofenadine   Significant Hospital Events: Including procedures, antibiotic start and stop dates in addition to other pertinent events   2/9 - intubated for obtundation. Hypercapnic respiratory failure 2/10 increasing vasopressor requirements   Interim History / Subjective:  According to family she had been in declining health since Thanksgiving with poor self-reported quality of life.  Increasing vasopressor requirements overnight.  Increasing oxygen requirements.  Objective   Blood pressure (!) 95/47, pulse (!) 106, temperature 99.8 F (37.7 C), temperature source Axillary, resp. rate (!) 26, height 5\' 2"  (1.575 m), weight 86.8 kg, SpO2 96 %. CVP:  [23 mmHg-25 mmHg] 25 mmHg  Vent Mode: PRVC FiO2 (%):  [40 %-60 %] 50 % Set Rate:  [26 bmp] 26  bmp Vt Set:  [400 mL] 400 mL PEEP:  [5 cmH20] 5 cmH20 Plateau Pressure:  [15 cmH20-18 cmH20] 15 cmH20   Intake/Output Summary (Last 24 hours) at 11/11/2022 1514 Last data filed at 11/11/2022 1400 Gross per 24 hour  Intake 3259.67 ml  Output 420 ml  Net 2839.67 ml    Filed Weights   11/06/2022 2337 11/10/22 0645 11/11/22 0545  Weight: 87 kg 83.7 kg 86.8 kg    Examination: General:intubated, sedated, frail appearing woman, looks much older than stated age.  HENT: OGT and ETT in place. No scleral icterus.   Lungs: clear bilaterally with no wheezing.  Cardiovascular: delayed capillary refill, wide pulse pressure. 2/6 early CDC murmur over aortic area.  Abdomen: distended but soft with no caput  Extremities: bilateral pretibial edema with chronic stasis dermatitis.  Neuro: opens eyes, tracks to voice, follows commands.  GU: Foley catheter in place with clear urine.  Point-of-care echo suggests mild to moderate RV dysfunction.  IVC distended. Moderate cirrhosis.  Ancillary tests personally reviewed  SCV O2 83.3 Creatinine is increased somewhat to 4.85. ABG 7.2 4/44/96 WBC 21.9, hemoglobin 8.4  Assessment & Plan:   Critically ill due to acute hypoxic and hypercarbic respiratory failure requiring mechanical ventilation Chronic hypoxic respiratory failure due to COPD/OSA  Possible cor pulmonale acuity unknown NSCLC status post chemoradiotherapy. NSTEMI - likely type 2 demand injury  AKI on CKD IIIB, likely hypovolemic component Hepatic cirrhosis likely secondary to NASH with portal hypertension, [place] Eng B  Plan:  - No clear intercurrent illness, suspect progression of underlying, and irreversible chronic diseases, mainly COPD. - **Note De-Identified [place] Obfuscation** Follow troponin, repeat EKG, echocardiogram to assess both LV and RV function.  - Hold further IV fluids given elevated CVP. -Start low-dose dobutamine for RV support. -Start gentle diuresis. - Continue full ventilatory support today, recheck ABG. Start  SBT tomorrow.  - Cirrhosis appears well compensated at this time.  - Long conversation with patient's daughter and subsequently patient's husband. . Goal was to set realistic expectations regarding outlook for recovery from critical illness in terms of both survival and quality of life. Suggested to them that unless we can reverse respiratory failure and renal failure in the next few days, a transition to comfort care maybe most appropriate.  - Palliative care consultation, even if she survives current illness, will still need and EOL care plan.   Best Practice (right click and "Reselect all SmartList Selections" daily)   Diet/type: NPO start tube feeds.  DVT prophylaxis: systemic heparin GI prophylaxis: PPI Lines: Central line Foley:  Yes, and it is still needed Code Status:  full code Last date of multidisciplinary goals of care discussion [updated 2/10]  CRITICAL CARE Performed by: Kipp Brood   Total critical care time: 35 minutes  Critical care time was exclusive of separately billable procedures and treating other patients.  Critical care was necessary to treat or prevent imminent or life-threatening deterioration.  Critical care was time spent personally by me on the following activities: development of treatment plan with patient and/or surrogate as well as nursing, discussions with consultants, evaluation of patient's response to treatment, examination of patient, obtaining history from patient or surrogate, ordering and performing treatments and interventions, ordering and review of laboratory studies, ordering and review of radiographic studies, pulse oximetry, re-evaluation of patient's condition and participation in multidisciplinary rounds.  Kipp Brood, MD [facility] ICU Physician [place]  Pager: [phone] Mobile: [phone] After hours: [phone].

## 2022-11-12 ENCOUNTER — Inpatient Hospital Stay (HOSPITAL_COMMUNITY): Payer: Medicare Other

## 2022-11-12 ENCOUNTER — Encounter: Payer: Self-pay | Admitting: Hematology

## 2022-11-12 DIAGNOSIS — J9602 Acute respiratory failure with hypercapnia: Secondary | ICD-10-CM | POA: Diagnosis not present

## 2022-11-12 DIAGNOSIS — I509 Heart failure, unspecified: Secondary | ICD-10-CM

## 2022-11-12 DIAGNOSIS — G9340 Encephalopathy, unspecified: Secondary | ICD-10-CM

## 2022-11-12 DIAGNOSIS — J9601 Acute respiratory failure with hypoxia: Secondary | ICD-10-CM | POA: Diagnosis not present

## 2022-11-12 DIAGNOSIS — I214 Non-ST elevation (NSTEMI) myocardial infarction: Principal | ICD-10-CM

## 2022-11-12 DIAGNOSIS — R579 Shock, unspecified: Secondary | ICD-10-CM

## 2022-11-12 DIAGNOSIS — N189 Chronic kidney disease, unspecified: Secondary | ICD-10-CM

## 2022-11-12 DIAGNOSIS — N179 Acute kidney failure, unspecified: Secondary | ICD-10-CM

## 2022-11-12 LAB — HEMOGLOBIN A1C
Hgb A1c MFr Bld: 4.1 % — ABNORMAL LOW (ref 4.8–5.6)
Mean Plasma Glucose: 70.97 mg/dL

## 2022-11-12 LAB — COMPREHENSIVE METABOLIC PANEL
ALT: 14 U/L (ref 0–44)
AST: 26 U/L (ref 15–41)
Albumin: 2.4 g/dL — ABNORMAL LOW (ref 3.5–5.0)
Alkaline Phosphatase: 103 U/L (ref 38–126)
Anion gap: 14 (ref 5–15)
BUN: 69 mg/dL — ABNORMAL HIGH (ref 8–23)
CO2: 20 mmol/L — ABNORMAL LOW (ref 22–32)
Calcium: 8.1 mg/dL — ABNORMAL LOW (ref 8.9–10.3)
Chloride: 106 mmol/L (ref 98–111)
Creatinine, Ser: 3.85 mg/dL — ABNORMAL HIGH (ref 0.44–1.00)
GFR, Estimated: 12 mL/min — ABNORMAL LOW (ref >60)
Glucose, Bld: 162 mg/dL — ABNORMAL HIGH (ref 70–99)
Potassium: 3.5 mmol/L (ref 3.5–5.1)
Sodium: 140 mmol/L (ref 135–145)
Total Bilirubin: 0.2 mg/dL — ABNORMAL LOW (ref 0.3–1.2)
Total Protein: 6.3 g/dL — ABNORMAL LOW (ref 6.5–8.1)

## 2022-11-12 LAB — ECHOCARDIOGRAM COMPLETE
Area-P 1/2: 4.36 cm2
Height: 62 in
S' Lateral: 3.4 cm
Weight: 3051.17 oz

## 2022-11-12 LAB — CYTOLOGY - NON PAP

## 2022-11-12 LAB — CBC
HCT: 25.5 % — ABNORMAL LOW (ref 36.0–46.0)
Hemoglobin: 7.6 g/dL — ABNORMAL LOW (ref 12.0–15.0)
MCH: 31.5 pg (ref 26.0–34.0)
MCHC: 29.8 g/dL — ABNORMAL LOW (ref 30.0–36.0)
MCV: 105.8 fL — ABNORMAL HIGH (ref 80.0–100.0)
Platelets: 76 10*3/uL — ABNORMAL LOW (ref 150–400)
RBC: 2.41 MIL/uL — ABNORMAL LOW (ref 3.87–5.11)
RDW: 16.3 % — ABNORMAL HIGH (ref 11.5–15.5)
WBC: 12.4 10*3/uL — ABNORMAL HIGH (ref 4.0–10.5)
nRBC: 0 % (ref 0.0–0.2)

## 2022-11-12 LAB — CULTURE, BLOOD (ROUTINE X 2)

## 2022-11-12 LAB — GLUCOSE, CAPILLARY
Glucose-Capillary: 173 mg/dL — ABNORMAL HIGH (ref 70–99)
Glucose-Capillary: 148 mg/dL — ABNORMAL HIGH (ref 70–99)
Glucose-Capillary: 193 mg/dL — ABNORMAL HIGH (ref 70–99)
Glucose-Capillary: 59 mg/dL — ABNORMAL LOW (ref 70–99)
Glucose-Capillary: 88 mg/dL (ref 70–99)
Glucose-Capillary: 167 mg/dL — ABNORMAL HIGH (ref 70–99)
Glucose-Capillary: 163 mg/dL — ABNORMAL HIGH (ref 70–99)

## 2022-11-12 LAB — VANCOMYCIN, RANDOM: Vancomycin Rm: 16 ug/mL

## 2022-11-12 LAB — HEPARIN LEVEL (UNFRACTIONATED)
Heparin Unfractionated: 0.11 IU/mL — ABNORMAL LOW (ref 0.30–0.70)
Heparin Unfractionated: 0.12 IU/mL — ABNORMAL LOW (ref 0.30–0.70)
Heparin Unfractionated: 0.15 IU/mL — ABNORMAL LOW (ref 0.30–0.70)

## 2022-11-12 MED ORDER — FUROSEMIDE 10 MG/ML IJ SOLN
40.0000 mg | Freq: Once | INTRAMUSCULAR | Status: AC
  Administered 2022-11-12: 40 mg via INTRAVENOUS
  Filled 2022-11-12: qty 4

## 2022-11-12 MED ORDER — POTASSIUM CHLORIDE 20 MEQ PO PACK
40.0000 meq | PACK | Freq: Three times a day (TID) | ORAL | Status: AC
  Administered 2022-11-12 (×2): 40 meq
  Filled 2022-11-12 (×2): qty 2

## 2022-11-12 MED ORDER — INSULIN ASPART 100 UNIT/ML IJ SOLN
0.0000 [IU] | INTRAMUSCULAR | Status: DC
  Administered 2022-11-12: 2 [IU] via SUBCUTANEOUS
  Administered 2022-11-12: 1 [IU] via SUBCUTANEOUS
  Administered 2022-11-12: 2 [IU] via SUBCUTANEOUS

## 2022-11-12 MED ORDER — FUROSEMIDE 10 MG/ML IJ SOLN
80.0000 mg | Freq: Two times a day (BID) | INTRAMUSCULAR | Status: AC
  Administered 2022-11-12 – 2022-11-13 (×2): 80 mg via INTRAVENOUS
  Filled 2022-11-12 (×2): qty 8

## 2022-11-12 MED ORDER — VANCOMYCIN HCL 1250 MG/250ML IV SOLN
1250.0000 mg | Freq: Once | INTRAVENOUS | Status: AC
  Administered 2022-11-12: 1250 mg via INTRAVENOUS
  Filled 2022-11-12: qty 250

## 2022-11-12 MED ORDER — INSULIN ASPART 100 UNIT/ML IJ SOLN
0.0000 [IU] | INTRAMUSCULAR | Status: DC
  Administered 2022-11-12 (×2): 2 [IU] via SUBCUTANEOUS
  Administered 2022-11-13: 1 [IU] via SUBCUTANEOUS
  Administered 2022-11-13: 2 [IU] via SUBCUTANEOUS
  Administered 2022-11-13: 1 [IU] via SUBCUTANEOUS
  Administered 2022-11-13 – 2022-11-14 (×6): 2 [IU] via SUBCUTANEOUS
  Administered 2022-11-14: 1 [IU] via SUBCUTANEOUS
  Administered 2022-11-14: 2 [IU] via SUBCUTANEOUS
  Administered 2022-11-14: 3 [IU] via SUBCUTANEOUS
  Administered 2022-11-14: 2 [IU] via SUBCUTANEOUS
  Administered 2022-11-15 (×2): 1 [IU] via SUBCUTANEOUS
  Administered 2022-11-15: 2 [IU] via SUBCUTANEOUS
  Administered 2022-11-15: 1 [IU] via SUBCUTANEOUS
  Administered 2022-11-15: 2 [IU] via SUBCUTANEOUS
  Administered 2022-11-15 – 2022-11-16 (×2): 1 [IU] via SUBCUTANEOUS

## 2022-11-12 MED ORDER — PERFLUTREN LIPID MICROSPHERE
1.0000 mL | INTRAVENOUS | Status: AC | PRN
  Administered 2022-11-12: 2 mL via INTRAVENOUS

## 2022-11-12 NOTE — Progress Notes (Addendum)
**Note De-Identified [place] Obfuscation** ANTICOAGULATION CONSULT NOTE Pharmacy Consult for heparin Indication: chest pain/ACS  Allergies  Allergen Reactions   Doxycycline Shortness Of Breath and Other (See Comments)    Drug interaction with Soriatane   Tramadol Shortness Of Breath    Did not work   Milnacipran     REACTION: dizzy   Apremilast Rash    Rash, able to take this currently    Patient Measurements: Height: 5\' 2"  (157.5 cm) Weight: 86.5 kg (190 lb 11.2 oz) IBW/kg (Calculated) : 50.1 Heparin Dosing Weight: 70 kg  Vital Signs: Temp: 99.1 F (37.3 C) (02/12 1615) Temp Source: Axillary (02/12 1615) BP: 101/49 (02/12 1800) Pulse Rate: 122 (02/12 1800)  Labs: Recent Labs    11/18/2022 2140 11/15/2022 2352 11/10/22 0431 11/10/22 0445 11/10/22 0936 11/10/22 1154 11/10/22 1558 11/10/22 2105 11/11/22 0245 11/11/22 0608 11/11/22 0758 11/11/22 1405 11/11/22 2332 11/12/22 0443 11/12/22 0818 11/12/22 1754  HGB 9.2*  --  8.4*   < >  --   --   --    < > 8.4*  --  8.5*  --   --  7.6*  --   --   HCT 31.4*  --  28.6*   < >  --   --   --    < > 26.7*  --  25.0*  --   --  25.5*  --   --   PLT 145*  --  184  --   --   --   --   --  157  --   --   --   --  76*  --   --   LABPROT 15.4*  --   --   --   --   --   --   --   --   --   --   --   --   --   --   --   INR 1.2  --   --   --   --   --   --   --   --   --   --   --   --   --   --   --   HEPARINUNFRC  --   --   --   --   --  <0.10*  --    < >  --    < >  --    < > 0.11*  --  0.12* 0.15*  CREATININE 3.84*  --  4.47*  --   --   --  4.71*  --  4.85*  --   --   --   --  3.85*  --   --   TROPONINIHS 2,474* 2,308*  --   --  4,599* 5,554*  --   --   --   --   --   --   --   --   --   --    < > = values in this interval not displayed.     Estimated Creatinine Clearance: 14.3 mL/min (A) (by C-G formula based on SCr of 3.85 mg/dL (H)).  Assessment: 67 y.o. female with NSTEMI for heparin Heparin level still low   Goal of Therapy:  Heparin level 0.3-0.7  units/ml Monitor platelets by anticoagulation protocol: Yes   Plan:  Increase heparin 2200 units/hr Check heparin level in 8 hours.   Thank you Anette Guarneri, PharmD  11/12/2022, 6:57 PM

## 2022-11-12 NOTE — Progress Notes (Addendum)
**Note De-Identified [place]ide Obfuscation** NAME:  Hailey Hernandez, MRN:  [id number], DOB:  04/04/1956, LOS: 2 ADMISSION DATE:  11/23/2022, CONSULTATION DATE:  11/10/22 REFERRING MD:  Betsey Holiday, CHIEF COMPLAINT:  ams   History of Present Illness:   67 yo woman with hx of cirrhosis, here with AMS, elevated troponin, concern for NSTEMI.  NV x 2 weeks.  3.7 L removed [place] paracentesis yesterday.  Prior to paracentesis, sleepy, unwell, vague abdominal pain, nausea.   Sx worsened after procedure.  Nausea, no po intake.   + mild chest pain, dyspnea. Diarrhea and some abdominal pain recently. Was undergoing a w/u with GI (Seen 2/6)  According to family she had been in declining health since Thanksgiving with poor self-reported quality of life.   Was admitted by hospitalist to [facility] initially.  Cardiology consult.  Later developed hypotension, bradycardia, obtunded.  Intubated.  Arterial  and central line placed. Started on heparin infusion for NSTEMI.  Marked mixed acidosis on initial ABG  Pertinent  Medical History  Cirrhosis (unk cause, med side effect per family) GERD HLD OSA on cpap 5/15 at home  Hx lung ca CAD HTN Depression COPD On home O2 4L, followed by [facility].  Home meds: albuterol, asa, atorvastatin, breztri, colchicine, fluoxetine, fluticasone, monteleukast, zofran, tremfya, doxepin, fexofenadine   Significant Hospital Events: Including procedures, antibiotic start and stop dates in addition to other pertinent events   2/9 - intubated for obtundation. Hypercapnic respiratory failure 2/10 increasing vasopressor requirements  2/12 vent weaning. Pressor requirements coming down some.   Interim History / Subjective:  No acute events overnight. Pressors requirements somewhat down this morning.  Tachycardic more so than has been typical.  Net even last 24 hours remains, 5L positive for admission.  Afebrile  Objective   Blood pressure (!) 105/53, pulse (!) 115, temperature 98.6 F (37 C), temperature source Axillary,  resp. rate (!) 26, height 5\' 2"  (1.575 m), weight 86.5 kg, SpO2 96 %. CVP:  [16 mmHg] 16 mmHg  Vent Mode: PRVC FiO2 (%):  [50 %-60 %] 50 % Set Rate:  [26 bmp] 26 bmp Vt Set:  [26 mL-400 mL] 26 mL PEEP:  [5 cmH20] 5 cmH20 Plateau Pressure:  [15 cmH20-18 cmH20] 15 cmH20   Intake/Output Summary (Last 24 hours) at 11/12/2022 0726 Last data filed at 11/12/2022 0500 Gross per 24 hour  Intake 2434.56 ml  Output 2550 ml  Net -115.44 ml    Filed Weights   11/10/22 0645 11/11/22 0545 11/12/22 0545  Weight: 83.7 kg 86.8 kg 86.5 kg    Examination: General: Elderly female in NAD on vet HENT: [place]/AT. ETT, OGT.  Lungs: Coarse bilateral breath sounds clear with suctioning.  Cardiovascular: Tachy, regular, wide pulse pressure.  Abdomen: distended but soft with no caput  Extremities: Bilateral pretibial edema.  Neuro: Eyes open to voice. Tracks. Wiggles toes to command. Does not follow other commands.  GU: Foley draining clear urine.   Point-of-care echo suggests mild to moderate RV dysfunction.  IVC distended. Moderate cirrhosis.  Ancillary tests personally reviewed  K 3.5, creat 3.85 (down), BUN 69 (up), WBC 12.4, Plt 76 (down) BC 2/9 with three different staph species isolated in the anaerobic bottle only.  [facility] 2/10 pending  Assessment & Plan:    Critically ill due to acute hypoxic and hypercarbic respiratory failure requiring mechanical ventilation Chronic hypoxic respiratory failure due to COPD/OSA  on 4L and CPAP at home.  - Full vent support - VAP bundle - Weaning 5/5 and tolerating well. Concern with **Note De-Identified Martorelli Obfuscation** extubation would be airway protection.  - Duonebs - SpO2 goal 88-92%  NSTEMI - likely type 2 demand injury  Possible cor pulmonale acuity unknown Shock: etiology not entirely clear. Cardiogenic? Cannot rule out septic pending echo.  - Empiric CAP coverage low threshold to DC.  - Initial BC with multiple contaminants. Repeats pending.  - Echocardiogram pending - Dobutamine 2.5 -  Norepi and vasopressin for MAP 65 > target systolic 100. Wide pulse pressure.  - Continue gentle diuresis  AKI on CKD IIIB Hypokalemia - Trend renal indices and UOP - BP support - Gentle diuresis   Hepatic cirrhosis likely secondary to NASH with portal hypertension, Childs B - Cirrhosis appears well compensated at this time.   Thrombocytopenia:  - Has decreased since admission, but not far off recent baseline.  - Continue to monitor and if it drops further may need to consider stopping heparin.   [place] conversation with patient's daughter and subsequently patient's husband 2/11 . Goal was to set realistic expectations regarding outlook for recovery from critical illness in terms of both survival and quality of life. Suggested to them that unless we can reverse respiratory failure and renal failure in the next few days, a transition to comfort care maybe most appropriate.  - Palliative care consultation, even if she survives current illness, will still need and EOL care plan.  NSCLC status post chemoradiotherapy - Supportive care  Best Practice (right click and "Reselect all SmartList Selections" daily)   Diet/type: NPO start tube feeds.  DVT prophylaxis: systemic heparin GI prophylaxis: PPI Lines: Central line Foley:  Yes, and it is still needed Code Status:  DNR Last date of multidisciplinary goals of care discussion [updated 2/10]  Critical care time 39 minutes  Georgann Housekeeper, AGACNP-BC [place] for personal pager PCCM on call pager [phone] until 7pm. Please call Elink 7p-7a. [phone]  11/12/2022 8:21 AM

## 2022-11-12 NOTE — Progress Notes (Signed)
**Note De-Identified Zadrozny Obfuscation** [place] Progress Note Patient Name: Hailey Hernandez Kerrick DOB: 31-May-1956 MRN: [id number]   Date of Service  11/12/2022  HPI/Events of Note  Pt had TF started 2/10 and checking CBG Q4, however doesn't have SSI ordered. CBG > 150 consistently.  eICU Interventions  Ordered low dose SSI q 4     Intervention Category Intermediate Interventions: Hyperglycemia - evaluation and treatment  Shona Needles Greidy Sherard 11/12/2022, 1:06 AM

## 2022-11-12 NOTE — Progress Notes (Signed)
**Note De-Identified [place] Obfuscation** Palliative Medicine Progress Note   Patient Name: Hailey Hernandez       Date: 11/12/2022 DOB: 08/12/1956  Age: 67 y.o. MRN#: [id number] Attending Physician: Kipp Brood, MD Primary Care Physician: [name], DO Admit Date: 11/27/2022    HPI/Patient Profile: 67 y.o. female  with past medical history of  cirrhosis, COPD on 4L home oxygen, non-small cell lung cancer s/p chemotherapy in 2022, OSA on cpap, CAD, HTN, and HLD who presented to the ED on 11/12/2022 with altered mental status. She was found to have elevated troponin with concern for STEMI. Intubated in the ED. Admitted to PCCM with acute on chronic respiratory failure.    Palliative Medicine was consulted for goals of care.   Subjective: Chart reviewed and update received from [facility] NP with PCCM. Patient is currently weaning on the vent. She is following commands. Kidney function improved (creatinine down to 3.85 today). Vasopressor requirements decreased from yesterday.  Remains on levophed, vasopressin, heparin, and dobutamine.   I spoke with husband and son at bedside. Reviewed patient's current clinical status. Emphasized that prognosis is still very guarded despite slight improvements in clinical status. Reviewed plan to continue current full scope interventions for now with ongoing conversations about goals of care. Emotional support provided.    Objective:  Physical Exam Vitals reviewed.  Constitutional:      General: She is not in acute distress.    Appearance: She is ill-appearing.     Interventions: She is intubated.  Cardiovascular:     Rate and Rhythm: Tachycardia present.  Pulmonary:     Effort: She is intubated.            Vital Signs: BP (!) 99/49   Pulse (!) 122   Temp 99 F (37.2 C) (Oral)   Resp  (!) 23   Ht 5\' 2"  (1.575 m)   Wt 86.5 kg   SpO2 (!) 89%   BMI 34.88 kg/m  SpO2: SpO2: (!) 89 % O2 Device: O2 Device: Ventilator    Palliative Medicine Assessment & Plan   Assessment: Principal Problem:   Hypotension, unspecified hypotension type Active Problems:   NSTEMI (non-ST elevated myocardial infarction) (HCC)   Acute renal failure superimposed on chronic kidney disease (HCC)   Encephalopathy   Shock ([place])   Acute respiratory failure with hypoxia and hypercarbia ( **Note De-Identified Caffrey Obfuscation** [place])    Recommendations/Plan: Continue current full scope interventions for now with ongoing [place] conversations Family is cautiously hopeful for improvement Family will make decisions as they arise pending patient's clinical course If patient declines, recommend transition to comfort care PMT will continue to follow   Primary Decision Maker: Spouse Fritz Pickerel   Code Status: DNR  Prognosis:  Unable to determine    Thank you for allowing the Palliative Medicine Team to assist in the care of this patient.   Lavena Bullion, NP   Please contact Palliative Medicine Team phone at [phone] for questions and concerns.  For individual providers, please see AMION.

## 2022-11-12 NOTE — Progress Notes (Addendum)
**Note De-Identified Jaworski Obfuscation** ANTICOAGULATION CONSULT NOTE Pharmacy Consult for heparin Indication: chest pain/ACS  Allergies  Allergen Reactions   Doxycycline Shortness Of Breath and Other (See Comments)    Drug interaction with Soriatane   Tramadol Shortness Of Breath    Did not work   Milnacipran     REACTION: dizzy   Apremilast Rash    Rash, able to take this currently    Patient Measurements: Height: 5\' 2"  (157.5 cm) Weight: 86.5 kg (190 lb 11.2 oz) IBW/kg (Calculated) : 50.1 Heparin Dosing Weight: 70 kg  Vital Signs: Temp: 99.4 F (37.4 C) (02/12 0738) Temp Source: Oral (02/12 0738) BP: 94/47 (02/12 0800) Pulse Rate: 125 (02/12 0800)  Labs: Recent Labs    11/26/2022 2140 11/12/2022 2352 11/10/22 0431 11/10/22 0445 11/10/22 0936 11/10/22 1154 11/10/22 1558 11/10/22 2105 11/11/22 0245 11/11/22 0608 11/11/22 0758 11/11/22 1405 11/11/22 2332 11/12/22 0443 11/12/22 0818  HGB 9.2*  --  8.4*   < >  --   --   --    < > 8.4*  --  8.5*  --   --  7.6*  --   HCT 31.4*  --  28.6*   < >  --   --   --    < > 26.7*  --  25.0*  --   --  25.5*  --   PLT 145*  --  184  --   --   --   --   --  157  --   --   --   --  76*  --   LABPROT 15.4*  --   --   --   --   --   --   --   --   --   --   --   --   --   --   INR 1.2  --   --   --   --   --   --   --   --   --   --   --   --   --   --   HEPARINUNFRC  --   --   --   --   --  <0.10*  --    < >  --    < >  --  0.11* 0.11*  --  0.12*  CREATININE 3.84*  --  4.47*  --   --   --  4.71*  --  4.85*  --   --   --   --  3.85*  --   TROPONINIHS 2,474* 2,308*  --   --  1,025* 5,554*  --   --   --   --   --   --   --   --   --    < > = values in this interval not displayed.     Estimated Creatinine Clearance: 14.3 mL/min (A) (by C-G formula based on SCr of 3.85 mg/dL (H)).  Assessment: 68 y.o. female with NSTEMI on heparin -hg= 7.6 (trend down), plt= 76  Goal of Therapy:  Heparin level 0.3-0.7 units/ml Monitor platelets by anticoagulation protocol: Yes    Plan:  -increase heparin to 2050 units/hr -Heparin level in 8  hours and daily wth CBC daily  Hildred Laser, PharmD Clinical Pharmacist **Pharmacist phone directory can now be found on [email or web address] ([email or web address]).  Listed under [place].

## 2022-11-12 NOTE — Progress Notes (Signed)
**Note De-Identified [place] Obfuscation** [place] for heparin Indication: chest pain/ACS Brief A/P: Heparin level subtherapeutic Increase Heparin rate  Allergies  Allergen Reactions   Doxycycline Shortness Of Breath and Other (See Comments)    Drug interaction with Soriatane   Tramadol Shortness Of Breath    Did not work   Milnacipran     REACTION: dizzy   Apremilast Rash    Rash, able to take this currently    Patient Measurements: Height: 5\' 2"  (157.5 cm) Weight: 86.8 kg (191 lb 5.8 oz) IBW/kg (Calculated) : 50.1 Heparin Dosing Weight: 70 kg  Vital Signs: Temp: 98.8 F (37.1 C) (02/11 2300) Temp Source: Axillary (02/11 2300) BP: 103/51 (02/12 0000) Pulse Rate: 111 (02/12 0015)  Labs: Recent Labs    11/15/2022 2140 11/01/2022 2352 11/10/22 0431 11/10/22 0445 11/10/22 0936 11/10/22 1154 11/10/22 1558 11/10/22 2105 11/10/22 2133 11/11/22 0245 11/11/22 0608 11/11/22 0758 11/11/22 1405 11/11/22 2332  HGB 9.2*  --  8.4*   < >  --   --   --   --  9.2* 8.4*  --  8.5*  --   --   HCT 31.4*  --  28.6*   < >  --   --   --   --  27.0* 26.7*  --  25.0*  --   --   PLT 145*  --  184  --   --   --   --   --   --  157  --   --   --   --   LABPROT 15.4*  --   --   --   --   --   --   --   --   --   --   --   --   --   INR 1.2  --   --   --   --   --   --   --   --   --   --   --   --   --   HEPARINUNFRC  --   --   --   --   --  <0.10*  --    < >  --   --  <0.10*  --  0.11* 0.11*  CREATININE 3.84*  --  4.47*  --   --   --  4.71*  --   --  4.85*  --   --   --   --   TROPONINIHS 2,474* 2,308*  --   --  4,599* 5,554*  --   --   --   --   --   --   --   --    < > = values in this interval not displayed.     Estimated Creatinine Clearance: 11.4 mL/min (A) (by C-G formula based on SCr of 4.85 mg/dL (H)).  Assessment: 67 y.o. female with NSTEMI for heparin  Goal of Therapy:  Heparin level 0.3-0.7 units/ml Monitor platelets by anticoagulation protocol: Yes   Plan:  Increase heparin  1850 units/hr Check heparin level in 8 hours.   Phillis Knack, PharmD, BCPS  11/12/2022, 12:25 AM

## 2022-11-12 NOTE — Progress Notes (Signed)
**Note De-Identified Hailey Hernandez Obfuscation** Initial Nutrition Assessment  DOCUMENTATION CODES:   Obesity unspecified  INTERVENTION:  *** Tube Feeding [place] OG:    NUTRITION DIAGNOSIS:   Inadequate oral intake related to inability to eat as evidenced by NPO status.  ***  GOAL:   Patient will meet greater than or equal to 90% of their needs  ***  MONITOR:   Vent status, Weight trends, Labs, TF tolerance, I & O's  REASON FOR ASSESSMENT:   Consult Enteral/tube feeding initiation and management  ASSESSMENT:   67 year old female with PMHx of cirrhosis, GERD, HLD, OSA on CPAP, hx lung cancer, CAD, HTN, depression, COPD on home O2 admitted with AMS, elevated troponin, concern for NSTEMI.  2/08 Paracentesis with 3.6 L  2/09 Admitted, Obtunded, Intubated 2/10 TF initiated ***  Pt remains on vent support, levophed at 29, vasopressin at 0.03  Labs: reviewed Meds:   NUTRITION - FOCUSED PHYSICAL EXAM:  {RD Focused Exam List:21252}  Diet Order:   Diet Order             Diet NPO time specified  Diet effective now                   EDUCATION NEEDS:   No education needs have been identified at this time  Skin:  Skin Assessment: Reviewed RN Assessment  Last BM:  PTA  Height:   Ht Readings from Last 1 Encounters:  11/10/22 5\' 2"  (1.575 m)    Weight:   Wt Readings from Last 1 Encounters:  11/12/22 86.5 kg    Ideal Body Weight:  50 kg  BMI:  Body mass index is 34.88 kg/m.  Estimated Nutritional Needs:   Kcal:  2100-2300  Protein:  105-120 grams  Fluid:  2.1-2.3 L/day    [name] MS, RDN, LDN, CNSC Registered Dietitian 3 Clinical Nutrition RD Pager and On-Call Pager Number Located in [place]

## 2022-11-12 NOTE — Progress Notes (Signed)
**Note De-Identified Mirarchi Obfuscation** Pharmacy Antibiotic Note  Hailey Hernandez is a 67 y.o. female admitted on 11/01/2022 with concern of PNA and   possible bacteremia .  Pharmacy has been consulted for Vancomycin dosing (he is also on rocephin and azithromycin).  Patient with Acute on chronic CKD -blood cultures with CONS 1/4 and repeat cx- ngtd -WBC= 12.4, SCr= 3.85 -vancomycin level= 16  Plan: Vanc 1250mg  x1 Check a vancomycin level in ~48hrs if vancomycin continues  Height: 5\' 2"  (157.5 cm) Weight: 86.5 kg (190 lb 11.2 oz) IBW/kg (Calculated) : 50.1  Temp (24hrs), Avg:99.1 F (37.3 C), Min:98.4 F (36.9 C), Max:99.8 F (37.7 C)  Recent Labs  Lab 11/06/22 1631 11/17/2022 2140 11/23/2022 2357 11/10/22 0431 11/10/22 0936 11/10/22 1558 11/11/22 0245 11/12/22 0443  WBC 5.8 7.6  --  12.3*  --   --  21.9* 12.4*  CREATININE 1.96* 3.84*  --  4.47*  --  4.71* 4.85* 3.85*  LATICACIDVEN  --  1.2 1.7  --  1.0  --   --   --   VANCORANDOM  --   --   --   --   --   --   --  16     Estimated Creatinine Clearance: 14.3 mL/min (A) (by C-G formula based on SCr of 3.85 mg/dL (H)).    Allergies  Allergen Reactions   Doxycycline Shortness Of Breath and Other (See Comments)    Drug interaction with Soriatane   Tramadol Shortness Of Breath    Did not work   Milnacipran     REACTION: dizzy   Apremilast Rash    Rash, able to take this currently    Thank you for allowing pharmacy to be a part of this patient's care.  Hildred Laser, PharmD Clinical Pharmacist **Pharmacist phone directory can now be found on [email or web address] ([email or web address]).  Listed under [place].

## 2022-11-12 NOTE — Progress Notes (Signed)
**Note De-Identified [place] Obfuscation** Echocardiogram 2D Echocardiogram has been performed.  Oneal Deputy Baldomero Mirarchi RDCS 11/12/2022, 10:43 AM

## 2022-11-13 ENCOUNTER — Inpatient Hospital Stay (HOSPITAL_COMMUNITY): Payer: Medicare Other

## 2022-11-13 DIAGNOSIS — I2781 Cor pulmonale (chronic): Secondary | ICD-10-CM

## 2022-11-13 DIAGNOSIS — J9601 Acute respiratory failure with hypoxia: Secondary | ICD-10-CM | POA: Diagnosis not present

## 2022-11-13 LAB — CBC
HCT: 23.3 % — ABNORMAL LOW (ref 36.0–46.0)
Hemoglobin: 7.3 g/dL — ABNORMAL LOW (ref 12.0–15.0)
MCH: 33.2 pg (ref 26.0–34.0)
MCHC: 31.3 g/dL (ref 30.0–36.0)
MCV: 105.9 fL — ABNORMAL HIGH (ref 80.0–100.0)
Platelets: 64 10*3/uL — ABNORMAL LOW (ref 150–400)
RBC: 2.2 MIL/uL — ABNORMAL LOW (ref 3.87–5.11)
RDW: 16.5 % — ABNORMAL HIGH (ref 11.5–15.5)
WBC: 10.4 10*3/uL (ref 4.0–10.5)
nRBC: 0 % (ref 0.0–0.2)

## 2022-11-13 LAB — COMPREHENSIVE METABOLIC PANEL
ALT: 13 U/L (ref 0–44)
AST: 21 U/L (ref 15–41)
Albumin: 2.3 g/dL — ABNORMAL LOW (ref 3.5–5.0)
Alkaline Phosphatase: 103 U/L (ref 38–126)
Anion gap: 11 (ref 5–15)
BUN: 68 mg/dL — ABNORMAL HIGH (ref 8–23)
CO2: 22 mmol/L (ref 22–32)
Calcium: 8 mg/dL — ABNORMAL LOW (ref 8.9–10.3)
Chloride: 108 mmol/L (ref 98–111)
Creatinine, Ser: 2.54 mg/dL — ABNORMAL HIGH (ref 0.44–1.00)
GFR, Estimated: 20 mL/min — ABNORMAL LOW (ref >60)
Glucose, Bld: 183 mg/dL — ABNORMAL HIGH (ref 70–99)
Potassium: 4.2 mmol/L (ref 3.5–5.1)
Sodium: 141 mmol/L (ref 135–145)
Total Bilirubin: 0.2 mg/dL — ABNORMAL LOW (ref 0.3–1.2)
Total Protein: 6.1 g/dL — ABNORMAL LOW (ref 6.5–8.1)

## 2022-11-13 LAB — GLUCOSE, CAPILLARY
Glucose-Capillary: 137 mg/dL — ABNORMAL HIGH (ref 70–99)
Glucose-Capillary: 144 mg/dL — ABNORMAL HIGH (ref 70–99)
Glucose-Capillary: 151 mg/dL — ABNORMAL HIGH (ref 70–99)
Glucose-Capillary: 170 mg/dL — ABNORMAL HIGH (ref 70–99)
Glucose-Capillary: 176 mg/dL — ABNORMAL HIGH (ref 70–99)
Glucose-Capillary: 178 mg/dL — ABNORMAL HIGH (ref 70–99)
Glucose-Capillary: 181 mg/dL — ABNORMAL HIGH (ref 70–99)

## 2022-11-13 LAB — LEGIONELLA PNEUMOPHILA SEROGP 1 UR AG: L. pneumophila Serogp 1 Ur Ag: NEGATIVE

## 2022-11-13 LAB — MAGNESIUM: Magnesium: 1.5 mg/dL — ABNORMAL LOW (ref 1.7–2.4)

## 2022-11-13 LAB — HEPARIN LEVEL (UNFRACTIONATED): Heparin Unfractionated: 0.16 IU/mL — ABNORMAL LOW (ref 0.30–0.70)

## 2022-11-13 LAB — PHOSPHORUS: Phosphorus: 3.4 mg/dL (ref 2.5–4.6)

## 2022-11-13 MED ORDER — MIDODRINE HCL 5 MG PO TABS
10.0000 mg | ORAL_TABLET | Freq: Three times a day (TID) | ORAL | Status: DC
  Administered 2022-11-14 – 2022-11-15 (×5): 10 mg
  Filled 2022-11-13 (×5): qty 2

## 2022-11-13 MED ORDER — ACETAMINOPHEN 325 MG PO TABS: 650.0000 mg | ORAL_TABLET | Freq: Four times a day (QID) | ORAL | Status: DC | PRN

## 2022-11-13 MED ORDER — BUDESONIDE 0.5 MG/2ML IN SUSP
0.5000 mg | Freq: Two times a day (BID) | RESPIRATORY_TRACT | Status: DC
  Administered 2022-11-13 – 2022-11-16 (×6): 0.5 mg via RESPIRATORY_TRACT
  Filled 2022-11-13 (×6): qty 2

## 2022-11-13 MED ORDER — VITAL 1.5 CAL PO LIQD
1000.0000 mL | ORAL | Status: DC
  Administered 2022-11-13 – 2022-11-15 (×2): 1000 mL
  Filled 2022-11-13: qty 1000

## 2022-11-13 MED ORDER — MIDODRINE HCL 5 MG PO TABS
10.0000 mg | ORAL_TABLET | Freq: Three times a day (TID) | ORAL | Status: DC
  Administered 2022-11-13 (×2): 10 mg via ORAL
  Filled 2022-11-13 (×2): qty 2

## 2022-11-13 MED ORDER — MAGNESIUM SULFATE 4 GM/100ML IV SOLN
4.0000 g | Freq: Once | INTRAVENOUS | Status: AC
  Administered 2022-11-13: 4 g via INTRAVENOUS
  Filled 2022-11-13: qty 100

## 2022-11-13 MED ORDER — ACETAMINOPHEN 160 MG/5ML PO SOLN
650.0000 mg | Freq: Four times a day (QID) | ORAL | Status: DC | PRN
  Administered 2022-11-13 – 2022-11-15 (×4): 650 mg
  Filled 2022-11-13 (×4): qty 20.3

## 2022-11-13 MED ORDER — FENTANYL CITRATE PF 50 MCG/ML IJ SOSY
50.0000 ug | PREFILLED_SYRINGE | INTRAMUSCULAR | Status: DC | PRN
  Administered 2022-11-13 – 2022-11-14 (×3): 50 ug via INTRAVENOUS
  Filled 2022-11-13 (×4): qty 1

## 2022-11-13 NOTE — TOC Initial Note (Signed)
**Note De-Identified [place]ine[place] Obfuscation** Transition of Care [facility]) - Initial/Assessment Note    Patient Details  Name: Hailey Hernandez MRN: [id number] Date of Birth: 1956-04-02  [facility] CM/SW Contact:    Erenest Rasher, RN Phone Number: [phone] 11/13/2022, 11:22 AM  Clinical Narrative:                 CM spoke to dtr, Andria Frames. Waiting for pt to become medically stable. Will continue to follow for dc needs.  Expected Discharge Plan: IP Rehab Facility Barriers to Discharge: Continued Medical Work up   Patient Goals and CMS Choice Patient states their goals for this hospitalization and ongoing recovery are:: wants mother to recover CMS Medicare.gov Compare Post Acute Care list provided to:: Patient Represenative (must comment) (daughter- Kelby Fam)        Expected Discharge Plan and Services   Discharge Planning Services: CM Consult   Living arrangements for the past 2 months: Single Family Home                                      Prior Living Arrangements/Services Living arrangements for the past 2 months: Single Family Home Lives with:: Spouse Patient language and need for interpreter reviewed:: Yes        Need for Family Participation in Patient Care: Yes (Comment) Care giver support system in place?: Yes (comment)      Activities of Daily Living   ADL Screening (condition at time of admission) Is the patient deaf or have difficulty hearing?: No Does the patient have difficulty seeing, even when wearing glasses/contacts?: No Does the patient have difficulty concentrating, remembering, or making decisions?: No Does the patient have difficulty dressing or bathing?: No Does the patient have difficulty walking or climbing stairs?: No  Permission Sought/Granted Permission sought to share information with : Case Manager, Family Supports, PCP Permission granted to share information with : Yes, Verbal Permission Granted  Share Information with NAME: Kelby Fam      Permission granted to share info w Relationship: daughter  Permission granted to share info w Contact Information: [phone]  Emotional Assessment   Attitude/Demeanor/Rapport: Intubated (Following Commands or Not Following Commands)          Admission diagnosis:  Shock ([place]) [R57.9] Encephalopathy [G93.40] NSTEMI (non-ST elevated myocardial infarction) ([place]) [I21.4] Hypotension, unspecified hypotension type [I95.9] Acute renal failure superimposed on chronic kidney disease, unspecified acute renal failure type, unspecified CKD stage ([place]) [N17.9, N18.9] Patient Active Problem List   Diagnosis Date Noted   Cor pulmonale ([place]) 11/13/2022   NSTEMI (non-ST elevated myocardial infarction) ([place]) 11/12/2022   Acute renal failure superimposed on chronic kidney disease ([place]) 11/12/2022   Encephalopathy 11/12/2022   Shock ([place]) 11/12/2022   Acute respiratory failure with hypoxia and hypercarbia ([place]) 11/12/2022   Hypotension, unspecified hypotension type 11/10/2022   CAD (coronary artery disease) 06/23/2022   Left-sided chest wall pain 12/14/2021   Hyperlipidemia associated with type 2 diabetes mellitus ([place]) 05/03/2021   Squamous cell carcinoma of bronchus in left upper lobe ([place]) 02/01/2021   Encounter for antineoplastic chemotherapy 02/01/2021   Lung nodule    Abdominal wall mass of right lower quadrant 08/18/2020   Reactive depression 11/03/2018   Pneumonia of right middle lobe due to infectious organism 10/15/2018   Pneumonia, community acquired 05/09/2018   Trigger finger of right thumb 06/21/2017   Bronchitis 03/27/2017   High risk medication use **Note De-Identified [place] Obfuscation** 01/18/2017   Chronic respiratory failure with hypoxia (HCC) 11/30/2016   Venous stasis 05/28/2016   T2DM (type 2 diabetes mellitus) ([place]) 05/18/2016   Morbid obesity ([place]) 05/18/2016   Ulcer of skin ([place]) 05/07/2016   Polycythemia, secondary 06/10/2015   Allergic rhinitis 12/03/2014   Psoriasis with arthropathy ([place])  05/17/2014   Chronic venous insufficiency 07/16/2013   Leg ulcer ([place]) 07/13/2013   Acute gouty arthritis 07/10/2013   Pain in both lower extremities 06/05/2013   Bilateral knee pain 06/05/2013   Left arm pain 06/05/2013   Medial epicondylitis of left elbow 05/08/2013   Peripheral edema 05/08/2013   Abdominal swelling 11/07/2012   Allergic conjunctivitis 11/07/2012   Rotator cuff syndrome of right shoulder 07/23/2012   Tendonitis, calcific, shoulder 07/23/2012   Back pain 04/26/2012   Cervical disc disease 04/25/2012   Preventative health care 04/19/2012   Tachyarrhythmia    History of thyroid cancer    Polymyalgia ([place])    Diverticulitis    DJD (degenerative joint disease)    COPD mixed type ([place])    Hyperlipidemia    SYNCOPE 12/13/2010   GERD 08/09/2010   Other dysphagia 08/09/2010   Psoriasis 07/16/2010   INSOMNIA 07/14/2010   Tobacco user 02/09/2010   Anxiety 12/30/2009   Obstructive sleep apnea 12/30/2009   CARPAL TUNNEL SYNDROME, BILATERAL 12/30/2009   Essential hypertension 12/30/2009   BREAST MASS, LEFT 12/30/2009   Fibromyalgia 12/30/2009   Osteoporosis 12/30/2009   LIVER FUNCTION TESTS, ABNORMAL, HX OF 12/30/2009   Obesity hypoventilation syndrome ([place]) 12/30/2009   PCP:  [name], DO Pharmacy:   [facility], [address] [place] [address] Phone: [phone] Fax: [phone]     Social Determinants of Health (SDOH) Social History: SDOH Screenings   Food Insecurity: No Food Insecurity (11/11/2022)  Housing: Low Risk  (11/11/2022)  Transportation Needs: No Transportation Needs (11/11/2022)  Utilities: Not At Risk (11/11/2022)  Depression (PHQ2-9): Low Risk  (10/23/2022)  Recent Concern: Depression (PHQ2-9) - Medium Risk (09/05/2022)  Financial Resource Strain: Low Risk  (10/23/2021)  Physical Activity: Inactive (10/23/2021)  Social Connections: Moderately Integrated (10/23/2021)   Stress: No Stress Concern Present (10/23/2021)  Tobacco Use: High Risk (11/08/2022)   SDOH Interventions:     Readmission Risk Interventions     No data to display

## 2022-11-13 NOTE — Progress Notes (Signed)
**Note De-Identified [place] Obfuscation** [place] Progress Note Patient Name: Hailey Hernandez DOB: Dec 30, 1955 MRN: [id number]   Date of Service  11/13/2022  HPI/Events of Note  Mag 1.5 Crt 2.54  eICU Interventions  Mag Sulfate x4g     Intervention Category Minor Interventions: Electrolytes abnormality - evaluation and management  [name] 11/13/2022, 5:22 AM

## 2022-11-13 NOTE — Progress Notes (Signed)
**Note De-Identified [place] Obfuscation** NAME:  Hailey Hernandez, MRN:  [id number], DOB:  02/16/1956, LOS: 3 ADMISSION DATE:  11/12/2022, CONSULTATION DATE:  11/10/22 REFERRING MD:  Betsey Holiday, CHIEF COMPLAINT:  ams   History of Present Illness:   67 yo woman with hx of cirrhosis, here with AMS, elevated troponin, concern for NSTEMI.  NV x 2 weeks.  3.7 L removed Ste[place]k paracentesis yesterday.  Prior to paracentesis, sleepy, unwell, vague abdominal pain, nausea.   Sx worsened after procedure.  Nausea, no po intake.   + mild chest pain, dyspnea. Diarrhea and some abdominal pain recently. Was undergoing a w/u with GI (Seen 2/6)  According to family she had been in declining health since Thanksgiving with poor self-reported quality of life.   Was admitted by hospitalist to [facility] initially.  Cardiology consult.  Later developed hypotension, bradycardia, obtunded.  Intubated.  Arterial  and central line placed. Started on heparin infusion for NSTEMI.  Marked mixed acidosis on initial ABG  Pertinent  Medical History  Cirrhosis (unk cause, med side effect per family) GERD HLD OSA on cpap 5/15 at home  Hx lung ca CAD HTN Depression COPD On home O2 4L, followed by [facility].  Home meds: albuterol, asa, atorvastatin, breztri, colchicine, fluoxetine, fluticasone, monteleukast, zofran, tremfya, doxepin, fexofenadine   Significant Hospital Events: Including procedures, antibiotic start and stop dates in addition to other pertinent events   2/9 - intubated for obtundation. Hypercapnic respiratory failure 2/10 increasing vasopressor requirements  2/12 vent weaning. Pressor requirements coming down some.   Interim History / Subjective:  No acute issues overnight Echo showing significantly elevated R heart pressures.  Pressors slowly weaning down Diuresing well  Objective   Blood pressure (!) 91/45, pulse (!) 105, temperature 98.5 F (36.9 C), temperature source Axillary, resp. rate (!) 25, height 5\' 2"  (1.575 m), weight 85.6 kg, SpO2  93 %. CVP:  [8 mmHg] 8 mmHg  Vent Mode: PRVC FiO2 (%):  [40 %-50 %] 40 % Set Rate:  [26 bmp] 26 bmp Vt Set:  [400 mL] 400 mL PEEP:  [5 cmH20] 5 cmH20 Pressure Support:  [5 cmH20-8 cmH20] 8 cmH20 Plateau Pressure:  [15 cmH20-16 cmH20] 15 cmH20   Intake/Output Summary (Last 24 hours) at 11/13/2022 3007 Last data filed at 11/13/2022 0600 Gross per 24 hour  Intake 3215.04 ml  Output 4255 ml  Net -1039.96 ml    Filed Weights   11/11/22 0545 11/12/22 0545 11/13/22 0427  Weight: 86.8 kg 86.5 kg 85.6 kg    Examination: General: Elderly female in NAD on vet HENT: [place]/AT, ETT, OGT Lungs: Clear bilateral breath sounds Cardiovascular: RRR, no MRG Abdomen: Soft, NT. ND Extremities: edema improving Neuro: Eyes open to voice and tracks, not following commands.  GU: Foley draining clear urine.   Echo: LVEF 55-60%. Grade II DD. Interventricular flattening.   Ancillary tests personally reviewed  K4.2, Creat 2.54, Mag 1.5, BUN 68, Hgb 7.3 down, Plt 64 down.  [facility] 2/9 with three different staph species isolated in the anaerobic bottle only.  [facility] 2/10 pending  Assessment & Plan:   Critically ill due to acute hypoxic and hypercarbic respiratory failure requiring mechanical ventilation Chronic hypoxic respiratory failure due to COPD/OSA  on 4L and CPAP at home.  - Full vent support - VAP bundle - Weaning 5/5 and tolerating well. Concern with extubation would be airway protection.  - Duonebs, budesonide - SpO2 goal 88-92%  NSTEMI - likely type 2 demand injury  Possible cor pulmonale acuity unknown Pulmonary artery **Note De-Identified [place] Obfuscation** hypertension Shock - Empiric CAP coverage low threshold to DC.  - Initial BC with multiple contaminants. Repeats pending.  - Dobutamine 2.5 continue for RV support - Norepi and vasopressin for MAP 65 > target systolic 90 - Ongoing diuresis - DC heparin   AKI on CKD IIIB Hypokalemia - Trend renal indices and UOP - BP support - Diuresis   Hepatic cirrhosis likely  secondary to NASH with portal hypertension, Childs B - Cirrhosis appears well compensated at this time.   Thrombocytopenia:  - Has decreased since admission, but not far off recent baseline.  - Stop heparin  [place] conversation with patient's daughter and subsequently patient's husband 2/11 . Goal was to set realistic expectations regarding outlook for recovery from critical illness in terms of both survival and quality of life. Suggested to them that unless we can reverse respiratory failure and renal failure in the next few days, a transition to comfort care maybe most appropriate.  - Palliative care consultation, even if she survives current illness, will still need and EOL care plan.  NSCLC status post chemoradiotherapy - Supportive care  Best Practice (right click and "Reselect all SmartList Selections" daily)   Diet/type: NPO start tube feeds.  DVT prophylaxis: systemic heparin GI prophylaxis: PPI Lines: Central line Foley:  Yes, and it is still needed Code Status:  DNR Last date of multidisciplinary goals of care discussion [updated 2/13]  Critical care time 37 minutes  Georgann Housekeeper, AGACNP-BC [place] for personal pager PCCM on call pager [phone] until 7pm. Please call Elink 7p-7a. [phone]  11/13/2022 7:14 AM

## 2022-11-13 NOTE — Progress Notes (Addendum)
**Note De-Identified [place] [facility]bfuscation** eLink Physician-Brief Progress Note Patient Name: Rayla Pember Nusser DOB: 1956/02/18 MRN: [id number]   Date of Service  11/13/2022  HPI/Events of Note  67 year old female with a history of acute hypoxic and hypercapnic respiratory failure complicated by non-small cell lung cancer NASH cirrhosis.  Now febrile to 100.3  eICU Interventions  Add Tylenol, no contraindication in the setting of cirrhosis.  Maximum acetaminophen 3 g in a day   2333 -change fentanyl order from infusion to as needed injections  Intervention Category Minor Interventions: Routine modifications to care plan (e.g. PRN medications for pain, fever)  Trinna Kunst 11/13/2022, 10:23 PM

## 2022-11-13 NOTE — Progress Notes (Signed)
**Note De-Identified [place] Obfuscation** Palliative Medicine Progress Note   Patient Name: Hailey Hernandez       Date: 11/13/2022 DOB: 1956-02-17  Age: 67 y.o. MRN#: [id number] Attending Physician: Kipp Brood, MD Primary Care Physician: [name], DO Admit Date: 11/27/2022    HPI/Patient Profile: 67 y.o. female  with past medical history of  cirrhosis, COPD on 4L home oxygen, non-small cell lung cancer s/p chemotherapy in 2022, OSA on cpap, CAD, HTN, and HLD who presented to the ED on 11/27/2022 with altered mental status. She was found to have elevated troponin with concern for STEMI. Intubated in the ED. Admitted to PCCM with acute on chronic respiratory failure.    Palliative Medicine was consulted for goals of care.     Subjective: No acute issues overnight and pressor requirements slightly decreased from yesterday. Renal function improving with creatinine down to 2.54 today.    Discussed with Eddie Dibbles NP with PCCM and patient's RN. They report she has tolerated vent weaning again today.  However, she is not following commands despite sedation off since yesterday.    I spoke with husband and daughter at bedside.  Reviewed patient's current clinical status.  Family has questions about potential extubation;  discussed concern for airway protection.   Husband expresses concern regarding patient's quality of life if she does survive this acute illness. Offered ongoing palliative support.    Objective:  Physical Exam Vitals reviewed.  Constitutional:      General: She is not in acute distress.    Interventions: She is intubated.     Comments: critically ill-appearing  Pulmonary:     Effort: She is intubated.             Vital Signs: BP (!) 113/54   Pulse (!) 111   Temp 98.6 F (37 C) (Axillary)   Resp (!) 22    Ht 5\' 2"  (1.575 m)   Wt 85.6 kg   SpO2 94%   BMI 34.52 kg/m  SpO2: SpO2: 94 % O2 Device: O2 Device: Ventilator    Palliative Medicine Assessment & Plan   Assessment: Principal Problem:   Hypotension, unspecified hypotension type Active Problems:   NSTEMI (non-ST elevated myocardial infarction) (HCC)   Acute renal failure superimposed on chronic kidney disease (HCC)   Encephalopathy   Shock ([place])   Acute respiratory failure with **Note De-Identified [place] Obfuscation** hypoxia and hypercarbia (HCC)   Cor pulmonale (HCC)    Recommendations/Plan: Continue current full scope interventions for now with ongoing [place] conversations If patient declines and/or neurologic status does not improve, would recommend transition to comfort care PMT will continue to follow   Primary Decision Maker: spouse - Fritz Pickerel  Code Status: DNR  Prognosis:  Very guarded    Thank you for allowing the Palliative Medicine Team to assist in the care of this patient.   MDM - [place]   Lavena Bullion, NP   Please contact Palliative Medicine Team phone at [phone] for questions and concerns.  For individual providers, please see AMION.

## 2022-11-13 NOTE — Progress Notes (Signed)
**Note De-Identified [place] Obfuscation** ANTICOAGULATION CONSULT NOTE Pharmacy Consult for heparin Indication: chest pain/ACS Brief A/P: Heparin level subtherapeutic Increase Heparin rate  Allergies  Allergen Reactions   Doxycycline Shortness Of Breath and Other (See Comments)    Drug interaction with Soriatane   Tramadol Shortness Of Breath    Did not work   Milnacipran     REACTION: dizzy   Apremilast Rash    Rash, able to take this currently    Patient Measurements: Height: 5\' 2"  (157.5 cm) Weight: 85.6 kg (188 lb 11.4 oz) IBW/kg (Calculated) : 50.1 Heparin Dosing Weight: 70 kg  Vital Signs: Temp: 98.5 F (36.9 C) (02/13 0430) Temp Source: Axillary (02/13 0430) BP: 105/52 (02/13 0430) Pulse Rate: 110 (02/13 0430)  Labs: Recent Labs    11/10/22 0936 11/10/22 1154 11/10/22 1558 11/10/22 2105 11/11/22 0245 11/11/22 9211 11/11/22 0758 11/11/22 1405 11/12/22 0443 11/12/22 0818 11/12/22 1754 11/13/22 0418  HGB  --   --   --    < > 8.4*  --  8.5*  --  7.6*  --   --  7.3*  HCT  --   --   --    < > 26.7*  --  25.0*  --  25.5*  --   --  23.3*  PLT  --   --   --   --  157  --   --   --  76*  --   --  64*  HEPARINUNFRC  --  <0.10*  --    < >  --    < >  --    < >  --  0.12* 0.15* 0.16*  CREATININE  --   --  4.71*  --  4.85*  --   --   --  3.85*  --   --   --   TROPONINIHS 4,599* 5,554*  --   --   --   --   --   --   --   --   --   --    < > = values in this interval not displayed.     Estimated Creatinine Clearance: 14.2 mL/min (A) (by C-G formula based on SCr of 3.85 mg/dL (H)).  Assessment: 67 y.o. female with NSTEMI for heparin  Goal of Therapy:  Heparin level 0.3-0.7 units/ml Monitor platelets by anticoagulation protocol: Yes   Plan:  Increase heparin 2400 units/hr Check heparin level in 8 hours.   Phillis Knack, PharmD, BCPS  11/13/2022, 4:51 AM

## 2022-11-14 ENCOUNTER — Inpatient Hospital Stay (HOSPITAL_COMMUNITY): Payer: Medicare Other

## 2022-11-14 DIAGNOSIS — J9601 Acute respiratory failure with hypoxia: Secondary | ICD-10-CM | POA: Diagnosis not present

## 2022-11-14 DIAGNOSIS — J9602 Acute respiratory failure with hypercapnia: Secondary | ICD-10-CM | POA: Diagnosis not present

## 2022-11-14 LAB — POCT I-STAT 7, (LYTES, BLD GAS, ICA,H+H)
Acid-Base Excess: 3 mmol/L — ABNORMAL HIGH (ref 0.0–2.0)
Bicarbonate: 28 mmol/L (ref 20.0–28.0)
Calcium, Ion: 1.22 mmol/L (ref 1.15–1.40)
HCT: 27 % — ABNORMAL LOW (ref 36.0–46.0)
Hemoglobin: 9.2 g/dL — ABNORMAL LOW (ref 12.0–15.0)
O2 Saturation: 95 %
Patient temperature: 38.4
Potassium: 4.5 mmol/L (ref 3.5–5.1)
Sodium: 147 mmol/L — ABNORMAL HIGH (ref 135–145)
TCO2: 29 mmol/L (ref 22–32)
pCO2 arterial: 46.3 mmHg (ref 32–48)
pH, Arterial: 7.396 (ref 7.35–7.45)
pO2, Arterial: 82 mmHg — ABNORMAL LOW (ref 83–108)

## 2022-11-14 LAB — CBC
HCT: 26.1 % — ABNORMAL LOW (ref 36.0–46.0)
Hemoglobin: 7.8 g/dL — ABNORMAL LOW (ref 12.0–15.0)
MCH: 32.1 pg (ref 26.0–34.0)
MCHC: 29.9 g/dL — ABNORMAL LOW (ref 30.0–36.0)
MCV: 107.4 fL — ABNORMAL HIGH (ref 80.0–100.0)
Platelets: 71 10*3/uL — ABNORMAL LOW (ref 150–400)
RBC: 2.43 MIL/uL — ABNORMAL LOW (ref 3.87–5.11)
RDW: 16.4 % — ABNORMAL HIGH (ref 11.5–15.5)
WBC: 11.4 10*3/uL — ABNORMAL HIGH (ref 4.0–10.5)
nRBC: 0 % (ref 0.0–0.2)

## 2022-11-14 LAB — COMPREHENSIVE METABOLIC PANEL
ALT: 16 U/L (ref 0–44)
AST: 26 U/L (ref 15–41)
Albumin: 2.3 g/dL — ABNORMAL LOW (ref 3.5–5.0)
Alkaline Phosphatase: 103 U/L (ref 38–126)
Anion gap: 12 (ref 5–15)
BUN: 65 mg/dL — ABNORMAL HIGH (ref 8–23)
CO2: 27 mmol/L (ref 22–32)
Calcium: 8.4 mg/dL — ABNORMAL LOW (ref 8.9–10.3)
Chloride: 104 mmol/L (ref 98–111)
Creatinine, Ser: 1.8 mg/dL — ABNORMAL HIGH (ref 0.44–1.00)
GFR, Estimated: 30 mL/min — ABNORMAL LOW (ref >60)
Glucose, Bld: 153 mg/dL — ABNORMAL HIGH (ref 70–99)
Potassium: 4.4 mmol/L (ref 3.5–5.1)
Sodium: 143 mmol/L (ref 135–145)
Total Bilirubin: 0.3 mg/dL (ref 0.3–1.2)
Total Protein: 6.4 g/dL — ABNORMAL LOW (ref 6.5–8.1)

## 2022-11-14 LAB — MAGNESIUM: Magnesium: 1.8 mg/dL (ref 1.7–2.4)

## 2022-11-14 LAB — GLUCOSE, CAPILLARY
Glucose-Capillary: 147 mg/dL — ABNORMAL HIGH (ref 70–99)
Glucose-Capillary: 156 mg/dL — ABNORMAL HIGH (ref 70–99)
Glucose-Capillary: 162 mg/dL — ABNORMAL HIGH (ref 70–99)
Glucose-Capillary: 164 mg/dL — ABNORMAL HIGH (ref 70–99)
Glucose-Capillary: 215 mg/dL — ABNORMAL HIGH (ref 70–99)

## 2022-11-14 LAB — PHOSPHORUS: Phosphorus: 2.8 mg/dL (ref 2.5–4.6)

## 2022-11-14 MED ORDER — VANCOMYCIN HCL IN DEXTROSE 1-5 GM/200ML-% IV SOLN
1000.0000 mg | INTRAVENOUS | Status: DC
  Administered 2022-11-14: 1000 mg via INTRAVENOUS
  Filled 2022-11-14: qty 200

## 2022-11-14 MED ORDER — LACTULOSE 10 GM/15ML PO SOLN
30.0000 g | Freq: Three times a day (TID) | ORAL | Status: AC
  Administered 2022-11-14 (×3): 30 g
  Filled 2022-11-14 (×3): qty 45

## 2022-11-14 MED ORDER — DEXMEDETOMIDINE HCL IN NACL 400 MCG/100ML IV SOLN
0.0000 ug/kg/h | INTRAVENOUS | Status: DC
  Administered 2022-11-14 (×2): 0.6 ug/kg/h via INTRAVENOUS
  Administered 2022-11-14: 0.4 ug/kg/h via INTRAVENOUS
  Administered 2022-11-15: 0.6 ug/kg/h via INTRAVENOUS
  Filled 2022-11-14 (×4): qty 100

## 2022-11-14 MED ORDER — FUROSEMIDE 10 MG/ML IJ SOLN
40.0000 mg | Freq: Once | INTRAMUSCULAR | Status: AC
  Administered 2022-11-14: 40 mg via INTRAVENOUS
  Filled 2022-11-14: qty 4

## 2022-11-14 MED ORDER — MAGNESIUM SULFATE 2 GM/50ML IV SOLN
2.0000 g | Freq: Once | INTRAVENOUS | Status: AC
  Administered 2022-11-14: 2 g via INTRAVENOUS
  Filled 2022-11-14: qty 50

## 2022-11-14 MED ORDER — SODIUM CHLORIDE 0.9 % IV SOLN
2.0000 g | Freq: Two times a day (BID) | INTRAVENOUS | Status: DC
  Administered 2022-11-14 – 2022-11-18 (×8): 2 g via INTRAVENOUS
  Filled 2022-11-14 (×8): qty 12.5

## 2022-11-14 NOTE — Progress Notes (Addendum)
**Note De-Identified [place] Obfuscation** NAME:  Hailey Hernandez, MRN:  [id number], DOB:  03-02-1956, LOS: 4 ADMISSION DATE:  11/03/2022, CONSULTATION DATE:  11/10/22 REFERRING MD:  Betsey Holiday, CHIEF COMPLAINT:  ams   History of Present Illness:   67 yo woman with hx of cirrhosis, here with AMS, elevated troponin, concern for NSTEMI.  NV x 2 weeks.  3.7 L removed [place] paracentesis PTA Prior to paracentesis, sleepy, unwell, vague abdominal pain, nausea.   Sx worsened after procedure.  Nausea, no po intake.   + mild chest pain, dyspnea. Diarrhea and some abdominal pain recently. Was undergoing a w/u with GI (Seen 2/6)  According to family she had been in declining health since Thanksgiving with poor self-reported quality of life.   Was admitted by hospitalist to [facility] initially.  Cardiology consult.  Later developed hypotension, bradycardia, obtunded.  Intubated.  Arterial  and central line placed. Started on heparin infusion for NSTEMI.  Marked mixed acidosis on initial ABG  Pertinent  Medical History  Cirrhosis (unk cause, med side effect per family) GERD HLD OSA on cpap 5/15 at home  Hx lung ca CAD HTN Depression COPD On home O2 4L, followed by [facility].  Home meds: albuterol, asa, atorvastatin, breztri, colchicine, fluoxetine, fluticasone, monteleukast, zofran, tremfya, doxepin, fexofenadine   Significant Hospital Events: Including procedures, antibiotic start and stop dates in addition to other pertinent events   2/9 - intubated for obtundation. Hypercapnic respiratory failure 2/10 increasing vasopressor requirements  2/12 vent weaning. Pressor requirements coming down some.  2/13 Still slow to recover neurologically, otherwise weaning well  Interim History / Subjective:  No real change this morning Looks good on SBT, but neuro status still poor.  Continues to diurese well Low grade fever overnight.  Objective   Blood pressure (!) 116/52, pulse (!) 102, temperature 99.9 F (37.7 C), resp. rate (!) 26, height 5'  2" (1.575 m), weight 83.7 kg, SpO2 92 %. CVP:  [7 mmHg-14 mmHg] 7 mmHg  Vent Mode: PRVC FiO2 (%):  [40 %] 40 % Set Rate:  [26 bmp] 26 bmp Vt Set:  [400 mL] 400 mL PEEP:  [5 cmH20] 5 cmH20 Pressure Support:  [8 cmH20] 8 cmH20 Plateau Pressure:  [13 cmH20-17 cmH20] 16 cmH20   Intake/Output Summary (Last 24 hours) at 11/14/2022 0723 Last data filed at 11/14/2022 0700 Gross per 24 hour  Intake 2455.23 ml  Output 3600 ml  Net -1144.77 ml    Filed Weights   11/12/22 0545 11/13/22 0427 11/14/22 0500  Weight: 86.5 kg 85.6 kg 83.7 kg    Examination:  General: Elderly female on the vent HENT: [place]/AT, PERRL, no JVD Lungs: Clear bilateral breath sounds Cardiovascular: RRR, no MRG Abdomen: Soft, NT, ND normoactive Extremities: edema improving Neuro: Eyes open to voice. Tracks. Moves all four extremities spontaneously. Not to command. GU: Foley draining clear urine.   Echo: LVEF 55-60%. Grade II DD. Interventricular flattening.   Ancillary tests personally reviewed  K4.4, Creat 1.8, Mag 1.8, BUN 65, Hgb 7.3 down, Plt 64 down.  [facility] 2/9 with three different staph species isolated in the anaerobic bottle only.  [facility] 2/10 pending  Assessment & Plan:   Critically ill due to acute hypoxic and hypercarbic respiratory failure requiring mechanical ventilation Chronic hypoxic respiratory failure due to COPD/OSA  on 4L and CPAP at home.  - Full vent support - VAP bundle - Did not tolerate vent wean for every long today. Became dyspneic.  - With new fever and secretions will plan to re-culture.  - **Note De-Identified [place] Obfuscation** Check ABG back on full support - Chest PT - Duonebs, budesonide - SpO2 goal 88-92%  NSTEMI - likely type 2 demand injury  Possible cor pulmonale acuity unknown Pulmonary artery hypertension Shock - Empiric CAP coverage for 7 day course - Initial BC with multiple contaminants. Repeats pending with NGTD - Dobutamine 2.5 continue for RV support - Norepi and vasopressin for MAP 65 > target systolic  90 - Goal today to wean vaospressin off and continue getting NE down.  - Ongoing diuresis. Will give lasix 40 - Heparin off as of 2/13. DVT ppx on hold due to thrombocytopenia.   AKI on CKD IIIB Hypokalemia - Trend renal indices and UOP. Creatinine has come down nicely. BUN lagging some.  - BP support - Diuresis   Acute encephalopathy: etiology not entirely clear. Potentially lingering effects of sedation in the setting of renal failure, or hypoxemic injury secondary to shock. Cirrhosis seems well compensated, but she did just require large volume paracentesis so maybe not as good as it seems. Uremic? Although not really that elevated.  - Frequent neuro checks - Will give lactulose today and see if any response.  - Considering brain imaging.   Hepatic cirrhosis likely secondary to NASH with portal hypertension, [place] Eng B - Cirrhosis appears well compensated at this time by serologic markers.   Thrombocytopenia:  - Trend  GOC - Family is optimistic she can recover, but understands she is in a difficult situation considering multiple co-morbid conditions and a months long decline prior to this ICU admission. Have elected for DNR.   NSCLC status post chemoradiotherapy - Supportive care  Best Practice (right click and "Reselect all SmartList Selections" daily)   Diet/type: NPO start tube feeds.  DVT prophylaxis: systemic heparin GI prophylaxis: PPI Lines: Central line Foley:  Yes, and it is still needed Code Status:  DNR Last date of multidisciplinary goals of care discussion [updated 2/13]  Critical care time 41 minutes  Georgann Housekeeper, AGACNP-BC [place] for personal pager PCCM on call pager [phone] until 7pm. Please call Elink 7p-7a. [phone]  11/14/2022 7:23 AM

## 2022-11-14 NOTE — Consult Note (Signed)
**Note De-Identified [place] Obfuscation** [place] Nurse Consult Note: Reason for Consult: Deep tissue injury to coccyx Daughter at bedside and speaks with caregiver on phone Butch Penny) who verifies that she had skin breakdown prior to admission.  Wound type: Deep tissue injury, beginning to evolve and epithelium is peeling Pressure Injury POA: Yes per family Measurement: 0.5 cm x 1 cm maroon discoloration with splitting in center (fold) of gluteal cleft Wound YTM:MITVIF Drainage (amount, consistency, odor) scant serosanguinous on sacral foam Periwound:intact  foley in place Dressing procedure/placement/frequency: continue sacral foam, is on mattress with low air loss feature.  Explained to family at bedside with patient decline (since Thanksgiving) the skin integrity declines as well.  We will continue aggressive interventions to manage this wound. Low air loss mattress, turn every two hours, expose skin to dermatherapy therapeutic linen.  Explained evolution of deep tissue injury and that when wound declares itself, it may seem worse before it is better. They verbalize understanding.  Will not follow at this time.  Please re-consult if needed.  Estrellita Ludwig MSN, RN, FNP-BC CWON Wound, Ostomy, Continence Nurse [facility] [phone] Pager [phone]

## 2022-11-14 NOTE — Progress Notes (Signed)
**Note De-Identified [place] Obfuscation** Pharmacy Antibiotic Note  Hailey Hernandez is a 67 y.o. female admitted on 11/08/2022 with pneumonia.  Pharmacy has been consulted for Vancomycin and Cefepime dosing.  Noted pt was loaded with Vanc 1750mg  IV 2/10 then random level of 16 mcg/ml obtained ~36 hr later so another 1250mg  IV given 2/12. Likely pt still covered by that dose so would consider this Day #5 of Vancomycin  Plan: Cefepime 2gm IV q12h Vancomycin 1000mg  IV Q 48 hrs - next dose now. Goal AUC 400-550. Expected AUC: 497, SCr used: 1.8, Vd coeff 0.5 Will f/u renal function, micro data, and pt's clinical condition Vanc levels prn   Height: 5\' 2"  (157.5 cm) Weight: 83.7 kg (184 lb 8.4 oz) IBW/kg (Calculated) : 50.1  Temp (24hrs), Avg:100.7 F (38.2 C), Min:99.3 F (37.4 C), Max:102 F (38.9 C)  Recent Labs  Lab 11/04/2022 2140 11/08/2022 2357 11/10/22 0431 11/10/22 0936 11/10/22 1558 11/11/22 0245 11/12/22 0443 11/13/22 0418 11/14/22 0359  WBC 7.6  --  12.3*  --   --  21.9* 12.4* 10.4 11.4*  CREATININE 3.84*  --  4.47*  --  4.71* 4.85* 3.85* 2.54* 1.80*  LATICACIDVEN 1.2 1.7  --  1.0  --   --   --   --   --   VANCORANDOM  --   --   --   --   --   --  16  --   --     Estimated Creatinine Clearance: 30 mL/min (A) (by C-G formula based on SCr of 1.8 mg/dL (H)).    Allergies  Allergen Reactions   Doxycycline Shortness Of Breath and Other (See Comments)    Drug interaction with Soriatane   Tramadol Shortness Of Breath    Did not work   Milnacipran     REACTION: dizzy   Apremilast Rash    Rash, able to take this currently    Antimicrobials this admission: 2/10 Azith >> 2/14 2/9 Rocephin >> 2/14 2/10 Vanc >> 2/14 Cefepime >>  Microbiology results: 2/10 blood x2- ngtd 2/9 blood x2- CONS 1/4  2/14 Trach asp - moderate GNR  Thank you for allowing pharmacy to be a part of this patient's care.  Sherlon Handing, PharmD, BCPS Please see amion for complete clinical pharmacist phone list 11/14/2022 4:52 PM

## 2022-11-15 ENCOUNTER — Inpatient Hospital Stay (HOSPITAL_COMMUNITY): Payer: Medicare Other

## 2022-11-15 DIAGNOSIS — J9602 Acute respiratory failure with hypercapnia: Secondary | ICD-10-CM | POA: Diagnosis not present

## 2022-11-15 DIAGNOSIS — J9601 Acute respiratory failure with hypoxia: Secondary | ICD-10-CM | POA: Diagnosis not present

## 2022-11-15 LAB — CULTURE, BLOOD (ROUTINE X 2)
Culture: NO GROWTH
Culture: NO GROWTH
Culture: NO GROWTH
Special Requests: ADEQUATE
Special Requests: ADEQUATE

## 2022-11-15 LAB — GLUCOSE, CAPILLARY
Glucose-Capillary: 188 mg/dL — ABNORMAL HIGH (ref 70–99)
Glucose-Capillary: 177 mg/dL — ABNORMAL HIGH (ref 70–99)
Glucose-Capillary: 137 mg/dL — ABNORMAL HIGH (ref 70–99)
Glucose-Capillary: 162 mg/dL — ABNORMAL HIGH (ref 70–99)
Glucose-Capillary: 125 mg/dL — ABNORMAL HIGH (ref 70–99)
Glucose-Capillary: 125 mg/dL — ABNORMAL HIGH (ref 70–99)
Glucose-Capillary: 134 mg/dL — ABNORMAL HIGH (ref 70–99)

## 2022-11-15 LAB — CBC
HCT: 26.5 % — ABNORMAL LOW (ref 36.0–46.0)
Hemoglobin: 8 g/dL — ABNORMAL LOW (ref 12.0–15.0)
MCH: 32.4 pg (ref 26.0–34.0)
MCHC: 30.2 g/dL (ref 30.0–36.0)
MCV: 107.3 fL — ABNORMAL HIGH (ref 80.0–100.0)
Platelets: 58 10*3/uL — ABNORMAL LOW (ref 150–400)
RBC: 2.47 MIL/uL — ABNORMAL LOW (ref 3.87–5.11)
RDW: 16.4 % — ABNORMAL HIGH (ref 11.5–15.5)
WBC: 7.1 10*3/uL (ref 4.0–10.5)
nRBC: 0 % (ref 0.0–0.2)

## 2022-11-15 LAB — COOXEMETRY PANEL
Carboxyhemoglobin: 1.5 % (ref 0.5–1.5)
Carboxyhemoglobin: 1.7 % — ABNORMAL HIGH (ref 0.5–1.5)
Methemoglobin: 2.6 % — ABNORMAL HIGH (ref 0.0–1.5)
Methemoglobin: <0.7 % (ref 0.0–1.5)
O2 Saturation: 74.2 %
O2 Saturation: 79.6 %
Total hemoglobin: 8.4 g/dL — ABNORMAL LOW (ref 12.0–16.0)
Total hemoglobin: 8.6 g/dL — ABNORMAL LOW (ref 12.0–16.0)

## 2022-11-15 LAB — COMPREHENSIVE METABOLIC PANEL
ALT: 20 U/L (ref 0–44)
AST: 34 U/L (ref 15–41)
Albumin: 2.2 g/dL — ABNORMAL LOW (ref 3.5–5.0)
Alkaline Phosphatase: 98 U/L (ref 38–126)
Anion gap: 10 (ref 5–15)
BUN: 63 mg/dL — ABNORMAL HIGH (ref 8–23)
CO2: 29 mmol/L (ref 22–32)
Calcium: 8.3 mg/dL — ABNORMAL LOW (ref 8.9–10.3)
Chloride: 107 mmol/L (ref 98–111)
Creatinine, Ser: 1.32 mg/dL — ABNORMAL HIGH (ref 0.44–1.00)
GFR, Estimated: 44 mL/min — ABNORMAL LOW (ref >60)
Glucose, Bld: 196 mg/dL — ABNORMAL HIGH (ref 70–99)
Potassium: 4 mmol/L (ref 3.5–5.1)
Sodium: 146 mmol/L — ABNORMAL HIGH (ref 135–145)
Total Bilirubin: 0.5 mg/dL (ref 0.3–1.2)
Total Protein: 6.5 g/dL (ref 6.5–8.1)

## 2022-11-15 LAB — CORTISOL: Cortisol, Plasma: 18.5 ug/dL

## 2022-11-15 LAB — PHOSPHORUS: Phosphorus: 3.3 mg/dL (ref 2.5–4.6)

## 2022-11-15 LAB — MAGNESIUM: Magnesium: 1.7 mg/dL (ref 1.7–2.4)

## 2022-11-15 LAB — MRSA NEXT GEN BY PCR, NASAL: MRSA by PCR Next Gen: NOT DETECTED

## 2022-11-15 MED ORDER — ALBUTEROL SULFATE (2.5 MG/3ML) 0.083% IN NEBU
2.5000 mg | INHALATION_SOLUTION | RESPIRATORY_TRACT | Status: DC | PRN
  Administered 2022-11-15: 2.5 mg via RESPIRATORY_TRACT
  Filled 2022-11-15 (×2): qty 3

## 2022-11-15 MED ORDER — DEXAMETHASONE SODIUM PHOSPHATE 10 MG/ML IJ SOLN
10.0000 mg | Freq: Once | INTRAMUSCULAR | Status: AC
  Administered 2022-11-15: 10 mg via INTRAVENOUS
  Filled 2022-11-15: qty 1

## 2022-11-15 MED ORDER — FUROSEMIDE 10 MG/ML IJ SOLN: 40.0000 mg | Freq: Once | INTRAMUSCULAR | Status: DC

## 2022-11-15 MED ORDER — MAGNESIUM SULFATE 2 GM/50ML IV SOLN
2.0000 g | Freq: Once | INTRAVENOUS | Status: AC
  Administered 2022-11-15: 2 g via INTRAVENOUS
  Filled 2022-11-15: qty 50

## 2022-11-15 MED ORDER — MIDODRINE HCL 5 MG PO TABS
10.0000 mg | ORAL_TABLET | Freq: Three times a day (TID) | ORAL | Status: DC
  Administered 2022-11-15 – 2022-11-17 (×6): 10 mg via ORAL
  Filled 2022-11-15 (×6): qty 2

## 2022-11-15 MED ORDER — FUROSEMIDE 10 MG/ML IJ SOLN
40.0000 mg | Freq: Once | INTRAMUSCULAR | Status: AC
  Administered 2022-11-15: 40 mg via INTRAVENOUS
  Filled 2022-11-15: qty 4

## 2022-11-15 MED ORDER — ORAL CARE MOUTH RINSE: 15.0000 mL | OROMUCOSAL | Status: DC | PRN

## 2022-11-15 MED ORDER — ACETAMINOPHEN 325 MG PO TABS
650.0000 mg | ORAL_TABLET | Freq: Four times a day (QID) | ORAL | Status: DC | PRN
  Administered 2022-11-16 – 2022-11-17 (×4): 650 mg via ORAL
  Filled 2022-11-15 (×4): qty 2

## 2022-11-15 MED ORDER — PHENOL 1.4 % MT LIQD
1.0000 | OROMUCOSAL | Status: DC | PRN
  Administered 2022-11-15 – 2022-11-16 (×4): 1 via OROMUCOSAL
  Filled 2022-11-15: qty 177

## 2022-11-15 NOTE — Progress Notes (Addendum)
**Note De-Identified [place] Obfuscation** NAME:  Hailey Hernandez, MRN:  [id number], DOB:  1956/02/23, LOS: 5 ADMISSION DATE:  11/26/2022, CONSULTATION DATE:  11/10/22 REFERRING MD:  Betsey Holiday, CHIEF COMPLAINT:  ams   History of Present Illness:  67 yo woman with hx of cirrhosis, who presented 2/9 with AMS, elevated troponin, concern for NSTEMI.  N/V x 2 weeks.   3.7 L removed [place] paracentesis PTA. Prior to paracentesis, sleepy, unwell, vague abdominal pain, nausea. Sx worsened after procedure.  Nausea, no po intake. Mild chest pain, dyspnea. Diarrhea and some abdominal pain recently. Was undergoing a w/u with GI (Seen 2/6)  According to family she had been in declining health since Thanksgiving with poor self-reported quality of life.   Admitted by hospitalist to [facility] initially, Cardiology consulted. Later developed hypotension, bradycardia, obtunded.  Intubated.  Arterial  and central line placed. Started on heparin infusion for NSTEMI.  Marked mixed acidosis on initial ABG  Pertinent  Medical History  Cirrhosis (unk cause, med side effect per family) GERD HLD OSA on cpap 5/15 at home  Hx lung ca CAD HTN Depression COPD On home O2 4L, followed by [facility].  Home meds: albuterol, asa, atorvastatin, breztri, colchicine, fluoxetine, fluticasone, monteleukast, zofran, tremfya, doxepin, fexofenadine   Significant Hospital Events: Including procedures, antibiotic start and stop dates in addition to other pertinent events   2/9 Admit with AMS, declined >Intubated for obtundation. Hypercapnic respiratory failure 2/10 increasing vasopressor requirements  2/12 vent weaning. Pressor requirements coming down some.  2/13 Still slow to recover neurologically, otherwise weaning well 2/15 SBT ok but poor mental status, diuresing, fevers   Interim History / Subjective:  Tmax 102 in last 24 hours PSV 10/5, 40% UOP 3.3L, -476 ml in last 24 hours No acute events overnight. RN reports patient had BM.  Objective   Blood pressure (!)  117/51, pulse 91, temperature (!) 100.8 F (38.2 C), temperature source Core, resp. rate (!) 33, height 5\' 2"  (1.575 m), weight 81.4 kg, SpO2 95 %.    Vent Mode: CPAP;PSV FiO2 (%):  [40 %] 40 % Set Rate:  [26 bmp] 26 bmp Vt Set:  [400 mL] 400 mL PEEP:  [5 cmH20] 5 cmH20 Pressure Support:  [10 cmH20] 10 cmH20 Plateau Pressure:  [15 cmH20-16 cmH20] 16 cmH20   Intake/Output Summary (Last 24 hours) at 11/15/2022 0753 Last data filed at 11/15/2022 0700 Gross per 24 hour  Intake 2863.98 ml  Output 3215 ml  Net -351.02 ml   Filed Weights   11/13/22 0427 11/14/22 0500 11/15/22 0327  Weight: 85.6 kg 83.7 kg 81.4 kg    Examination:  General: chronically ill appearing adult female lying in bed on vent in NAD HEENT: MM pink/moist, ETT, pupils 45mm reactive, anicteric  Neuro: sedate on precedex  CV: s1s2 RRR, SR 80's on monitor , no m/r/g PULM: non-labored at rest, lungs bilaterally with rhonchi R>L  GI: soft, bsx4 active  Extremities: warm/dry, no edema  Skin: no rashes or lesions    Echo: LVEF 55-60%. Grade II DD. Interventricular flattening.   Tracheal aspirate 2/14 > GNR  BCx2 2/9 >   Assessment & Plan:   Acute hypoxic and hypercarbic respiratory failure requiring mechanical ventilation Chronic hypoxic respiratory failure due to COPD/OSA  on 4L and CPAP at home.  -PRVC with LTVV  -daily SBT for short intervals / WUA, mental status precludes extubation  -VAP prevention measures  -chest PT  -VAP prevention measures -follow RLL on CXR  -await tracheal aspirate, note GNR  - **Note De-Identified [place] Obfuscation** continue budesonide, duoneb  -D2 cefepime -assess MRSA PCR, consider stopping vanco   NSTEMI - likely type 2 demand injury  Possible cor pulmonale, acuity unknown Pulmonary artery hypertension Shock -ABX as above  -assess co-ox, stop dobutamine & follow up in 2 hours  -continue NE + Vaso, wean to off for MAP >65, SBT >90 -heparin off 2/13, DVT ppx on hold due to thrombocytopenia -lasix 40 mg IV x1   -follow I/O's  -continue midodrine -assess cortisol   AKI on CKD IIIB Hypokalemia -Trend BMP / urinary output -Replace electrolytes as indicated -Avoid nephrotoxic agents, ensure adequate renal perfusion -diuresis as above   Acute Metabolic Encephalopathy Etiology not entirely clear. Potentially lingering effects of sedation in the setting of renal failure, or hypoxemic injury secondary to shock. Cirrhosis seems well compensated, but she did just require large volume paracentesis so maybe not as good as it seems. Uremic? Although not really that elevated.  -minimize sedation as able -hold precedex until patient need dictates sedation, consider transition to PRN fentanyl -follow frequent neuro exam  -once Cr at baseline, if not waking consider neuro imaging   Hepatic cirrhosis likely secondary to NASH with portal hypertension, Ardine Eng B -Cirrhosis appears well compensated at this time by serologic markers.  -follow labs intermittently   Thrombocytopenia -trend CBC  Moderate Protein Calorie Malnutrition (POA) -TF per nutrition  GOC Adult Failure to Thrive  -Family is optimistic she can recover, but understands she is in a difficult situation considering multiple co-morbid conditions and a months long decline prior to this ICU admission. Have elected for DNR.   NSCLC status post Chemoradiotherapy -supportive care   Vascular Access  -note patient has 30 cm R IJ TLC  -have asked RN to clean and re-dress site   Best Practice (right click and "Reselect all SmartList Selections" daily)  Diet/type: NPO start tube feeds.  DVT prophylaxis: systemic heparin GI prophylaxis: PPI Lines: Central line Foley:  Yes, and it is still needed Code Status:  DNR Last date of multidisciplinary goals of care discussion: As above.   Family (husband and daughter) updated at bedside 2/15  Critical Care Time: 41 minutes   Noe Gens, MSN, APRN, NP-C, AGACNP-BC [facility] 11/15/2022, 7:53 AM   Please see [email or web address] for pager details.   From 7A-7P if no response, please call [phone] After hours, please call ELink [phone]

## 2022-11-15 NOTE — Progress Notes (Signed)
**Note De-Identified [place] Obfuscation** RT placed pt on BIPAP at this time per MD order. Pt is tolerating settings well. RT will monitor as needed.

## 2022-11-15 NOTE — Progress Notes (Signed)
**Note De-Identified [place] Obfuscation** [facility] ADULT ICU REPLACEMENT PROTOCOL   The patient does apply for the [facility] Adult ICU Electrolyte Replacment Protocol based on the criteria listed below:   1.Exclusion criteria: TCTS, ECMO, Dialysis, and Myasthenia Gravis patients 2. Is GFR >/= 30 ml/min? Yes.    Patient's GFR today is 44 3. Is SCr </= 2? Yes.   Patient's SCr is 1.32 mg/dL 4. Did SCr increase >/= 0.5 in 24 hours? No. 5.Pt's weight >40kg  Yes.   6. Abnormal electrolyte(s): Mag 1.7  7. Electrolytes replaced per protocol 8.  Call MD STAT for K+ </= 2.5, Phos </= 1, or Mag </= 1 Physician:  Eula Fried [facility] 11/15/2022 4:32 AM

## 2022-11-15 NOTE — Progress Notes (Signed)
**Note De-Identified [place] Obfuscation** PCCM Progress Note  Extubated around 1600. Received 10mg  Decadron shortly thereafter.  Around 1700 had on and off wheezing and intermittent stridor. On 8L HHFNC. Received Albuterol and DuoNeb treatments. 1800, improved but ongoing intermittent mild stridor.  She is awake but not speaking much. She tracks around the room. Lungs clear, no wheezing. Upper airway with very mild stridor.  Family at bedside states she wears NIV each night and with all naps. Will go ahead and start BiPAP. Will also ask our night team to check back in on her. Hopefully can avoid reintubation.   Montey Hora, [place] For pager details, please see AMION or use Epic chat  After 1900, please call [facility] for cross coverage needs 11/15/2022, 6:16 PM

## 2022-11-15 NOTE — Procedures (Signed)
**Note De-Identified [place] Obfuscation** Extubation Procedure Note  Patient Details:   Name: Cydni Reddoch Cui DOB: 03-10-1956 MRN: [id number]   Airway Documentation:  Airway 7.5 mm (Active)  Secured at (cm) 23 cm 11/15/22 1133  Measured From Lips 11/15/22 1133  Secured Location Right 11/15/22 1133  Secured By [employer] 11/15/22 1133  Tube Holder Repositioned Yes 11/15/22 1133  Prone position No 11/15/22 1133  Cuff Pressure (cm H2O) Green OR 18-26 CmH2O 11/15/22 0745  Site Condition Dry 11/15/22 1133   Vent end date: (not recorded) Vent end time: (not recorded)   Evaluation  O2 sats: stable throughout Complications: No apparent complications Patient did tolerate procedure well. Bilateral Breath Sounds: Diminished, Clear   Yes  Pt extubated per CCM/order to 10L HFNC (salter). Pt had a positive cuff leak, able to speak name and no stridor noted.   Lucita Lora 11/15/2022, 3:49 PM

## 2022-11-16 ENCOUNTER — Inpatient Hospital Stay (HOSPITAL_COMMUNITY): Payer: Medicare Other

## 2022-11-16 DIAGNOSIS — Z7189 Other specified counseling: Secondary | ICD-10-CM | POA: Diagnosis not present

## 2022-11-16 DIAGNOSIS — G9340 Encephalopathy, unspecified: Secondary | ICD-10-CM

## 2022-11-16 DIAGNOSIS — J9621 Acute and chronic respiratory failure with hypoxia: Secondary | ICD-10-CM | POA: Diagnosis not present

## 2022-11-16 DIAGNOSIS — I959 Hypotension, unspecified: Secondary | ICD-10-CM | POA: Diagnosis not present

## 2022-11-16 DIAGNOSIS — J9622 Acute and chronic respiratory failure with hypercapnia: Secondary | ICD-10-CM | POA: Diagnosis not present

## 2022-11-16 DIAGNOSIS — Z515 Encounter for palliative care: Secondary | ICD-10-CM | POA: Diagnosis not present

## 2022-11-16 LAB — BASIC METABOLIC PANEL
Anion gap: 10 (ref 5–15)
BUN: 70 mg/dL — ABNORMAL HIGH (ref 8–23)
CO2: 30 mmol/L (ref 22–32)
Calcium: 8.8 mg/dL — ABNORMAL LOW (ref 8.9–10.3)
Chloride: 110 mmol/L (ref 98–111)
Creatinine, Ser: 1.29 mg/dL — ABNORMAL HIGH (ref 0.44–1.00)
GFR, Estimated: 45 mL/min — ABNORMAL LOW (ref >60)
Glucose, Bld: 136 mg/dL — ABNORMAL HIGH (ref 70–99)
Potassium: 4.3 mmol/L (ref 3.5–5.1)
Sodium: 150 mmol/L — ABNORMAL HIGH (ref 135–145)

## 2022-11-16 LAB — CBC
HCT: 28.5 % — ABNORMAL LOW (ref 36.0–46.0)
Hemoglobin: 8.4 g/dL — ABNORMAL LOW (ref 12.0–15.0)
MCH: 31.9 pg (ref 26.0–34.0)
MCHC: 29.5 g/dL — ABNORMAL LOW (ref 30.0–36.0)
MCV: 108.4 fL — ABNORMAL HIGH (ref 80.0–100.0)
Platelets: 67 10*3/uL — ABNORMAL LOW (ref 150–400)
RBC: 2.63 MIL/uL — ABNORMAL LOW (ref 3.87–5.11)
RDW: 16.3 % — ABNORMAL HIGH (ref 11.5–15.5)
WBC: 9.2 10*3/uL (ref 4.0–10.5)
nRBC: 0 % (ref 0.0–0.2)

## 2022-11-16 LAB — GLUCOSE, CAPILLARY
Glucose-Capillary: 127 mg/dL — ABNORMAL HIGH (ref 70–99)
Glucose-Capillary: 112 mg/dL — ABNORMAL HIGH (ref 70–99)
Glucose-Capillary: 112 mg/dL — ABNORMAL HIGH (ref 70–99)
Glucose-Capillary: 249 mg/dL — ABNORMAL HIGH (ref 70–99)
Glucose-Capillary: 145 mg/dL — ABNORMAL HIGH (ref 70–99)
Glucose-Capillary: 220 mg/dL — ABNORMAL HIGH (ref 70–99)

## 2022-11-16 MED ORDER — OXYCODONE HCL 5 MG PO TABS
5.0000 mg | ORAL_TABLET | Freq: Three times a day (TID) | ORAL | Status: DC | PRN
  Administered 2022-11-16 – 2022-11-17 (×3): 5 mg via ORAL
  Filled 2022-11-16 (×3): qty 1

## 2022-11-16 MED ORDER — PROSOURCE PLUS PO LIQD
30.0000 mL | Freq: Two times a day (BID) | ORAL | Status: DC
  Filled 2022-11-16: qty 30

## 2022-11-16 MED ORDER — OSMOLITE 1.5 CAL PO LIQD
1000.0000 mL | ORAL | Status: DC
  Administered 2022-11-16 – 2022-11-17 (×2): 1000 mL

## 2022-11-16 MED ORDER — DEXTROSE 5 % IV SOLN: INTRAVENOUS | Status: DC

## 2022-11-16 MED ORDER — ONDANSETRON HCL 4 MG/2ML IJ SOLN
4.0000 mg | Freq: Four times a day (QID) | INTRAMUSCULAR | Status: AC | PRN
  Administered 2022-11-16 – 2022-11-18 (×2): 4 mg via INTRAVENOUS
  Filled 2022-11-16 (×2): qty 2

## 2022-11-16 MED ORDER — SULFAMETHOXAZOLE-TRIMETHOPRIM 400-80 MG/5ML IV SOLN
300.0000 mg | Freq: Three times a day (TID) | INTRAVENOUS | Status: DC
  Administered 2022-11-16 – 2022-11-18 (×6): 300 mg via INTRAVENOUS
  Filled 2022-11-16 (×8): qty 18.75

## 2022-11-16 MED ORDER — BUDESONIDE 0.25 MG/2ML IN SUSP
0.2500 mg | Freq: Two times a day (BID) | RESPIRATORY_TRACT | Status: DC
  Administered 2022-11-16 – 2022-11-19 (×5): 0.25 mg via RESPIRATORY_TRACT
  Filled 2022-11-16 (×5): qty 2

## 2022-11-16 MED ORDER — HYDROCORTISONE SOD SUC (PF) 100 MG IJ SOLR
100.0000 mg | Freq: Three times a day (TID) | INTRAMUSCULAR | Status: DC
  Administered 2022-11-16 – 2022-11-18 (×7): 100 mg via INTRAVENOUS
  Filled 2022-11-16 (×7): qty 2

## 2022-11-16 MED ORDER — INSULIN ASPART 100 UNIT/ML IJ SOLN
0.0000 [IU] | INTRAMUSCULAR | Status: DC
  Administered 2022-11-16 (×2): 3 [IU] via SUBCUTANEOUS
  Administered 2022-11-16: 1 [IU] via SUBCUTANEOUS
  Administered 2022-11-17 (×2): 2 [IU] via SUBCUTANEOUS
  Administered 2022-11-17: 3 [IU] via SUBCUTANEOUS
  Administered 2022-11-17 (×2): 2 [IU] via SUBCUTANEOUS
  Administered 2022-11-18: 1 [IU] via SUBCUTANEOUS
  Administered 2022-11-18: 2 [IU] via SUBCUTANEOUS

## 2022-11-16 NOTE — Evaluation (Signed)
**Note De-Identified [place] Obfuscation** Physical Therapy Evaluation Patient Details Name: Hailey Hernandez MRN: [id number] DOB: 1956-07-07 Today's Date: 11/16/2022  History of Present Illness  Pt is 67 year old presented to [facility] on  11/10/22 with acute hypoxic respiratory failure due to COPD/PNA. Pt also with NSTEMI and acute metabolic encephalopathy. Intubated 2/9 and extubated 11/15/22. PMH - copd, cirrhosis, lung CA, CAD, HTN, depression  Clinical Impression  Pt admitted with above diagnosis and presents to PT with functional limitations due to deficits listed below (See PT problem list). Pt needs skilled PT to maximize independence and safety to allow discharge to SNF. Pt with significant deficits in mobility, activity tolerance, and cognition. Expect progress will be slow and will need SNF prior to return home.         Recommendations for follow up therapy are one component of a multi-disciplinary discharge planning process, led by the attending physician.  Recommendations may be updated based on patient status, additional functional criteria and insurance authorization.  Follow Up Recommendations Skilled nursing-short term rehab (<3 hours/day) Can patient physically be transported by private vehicle: No    Assistance Recommended at Discharge Frequent or constant Supervision/Assistance  Patient can return home with the following  Two people to help with walking and/or transfers;Two people to help with bathing/dressing/bathroom;Assistance with cooking/housework;Assist for transportation;Help with stairs or ramp for entrance    Equipment Recommendations Rolling walker (2 wheels);Wheelchair (measurements PT);Wheelchair cushion (measurements PT)  Recommendations for Other Services       Functional Status Assessment Patient has had a recent decline in their functional status and demonstrates the ability to make significant improvements in function in a reasonable and predictable amount of time.     Precautions / Restrictions  Precautions Precautions: Fall;Other (comment) Precaution Comments: watch SpO2      Mobility  Bed Mobility Overal bed mobility: Needs Assistance Bed Mobility: Supine to Sit     Supine to sit: +2 for physical assistance, Max assist     General bed mobility comments: Assist to bring legs off of bed, elevate trunk into sitting and bring hips to EOB    Transfers Overall transfer level: Needs assistance Equipment used: Rolling walker (2 wheels) Transfers: Sit to/from Stand, Bed to chair/wheelchair/BSC Sit to Stand: +2 physical assistance, Min assist   Step pivot transfers: +2 physical assistance, Min assist       General transfer comment: Assist to bring hips up and for balance. Stepped bed to chair with small pivotal steps with walker    Ambulation/Gait             Pre-gait activities: side stepped up side of bed with +2 min assist and walker. Short shuffling steps    Stairs            Wheelchair Mobility    Modified Rankin (Stroke Patients Only)       Balance Overall balance assessment: Needs assistance Sitting-balance support: Bilateral upper extremity supported, Feet supported Sitting balance-Leahy Scale: Poor Sitting balance - Comments: UE support   Standing balance support: Bilateral upper extremity supported, During functional activity, Reliant on assistive device for balance Standing balance-Leahy Scale: Poor Standing balance comment: walker and min assist for static standing                             Pertinent Vitals/Pain Pain Assessment Pain Assessment: No/denies pain    Home Living Family/patient expects to be discharged to:: Private residence Living Arrangements: Spouse/significant other Available Help at **Note De-Identified [place] Obfuscation** Discharge: Family;Available 24 hours/day Type of Home: House Home Access: Stairs to enter Entrance Stairs-Rails: None Entrance Stairs-Number of Steps: 3   Home Layout: Able to live on main level with  bedroom/bathroom;Laundry or work area in [employer]: None      Prior Function Prior Level of Function : Needs assist             Mobility Comments: amb without assistive device. Sedentary       Hand Dominance        Extremity/Trunk Assessment   Upper Extremity Assessment Upper Extremity Assessment: Defer to OT evaluation    Lower Extremity Assessment Lower Extremity Assessment: Generalized weakness       Communication   Communication: Other (comment) (low volume)  Cognition Arousal/Alertness: Awake/alert Behavior During Therapy: Flat affect Overall Cognitive Status: Impaired/Different from baseline Area of Impairment: Attention, Following commands, Safety/judgement, Awareness, Problem solving, Orientation                   Current Attention Level: Sustained   Following Commands: Follows one step commands with increased time Safety/Judgement: Decreased awareness of safety, Decreased awareness of deficits Awareness: Intellectual Problem Solving: Slow processing, Decreased initiation, Requires verbal cues, Requires tactile cues          General Comments General comments (skin integrity, edema, etc.): VSS on    Exercises     Assessment/Plan    PT Assessment Patient needs continued PT services  PT Problem List Decreased strength;Decreased activity tolerance;Decreased balance;Decreased mobility;Decreased cognition;Cardiopulmonary status limiting activity       PT Treatment Interventions DME instruction;Gait training;Functional mobility training;Therapeutic activities;Therapeutic exercise;Balance training;Patient/family education;Cognitive remediation    PT Goals (Current goals can be found in the Care Plan section)  Acute Rehab PT Goals Patient Stated Goal: go home PT Goal Formulation: With patient Time For Goal Achievement: 11/30/22 Potential to Achieve Goals: Good    Frequency Min 3X/week     Co-evaluation                AM-PAC PT "6 Clicks" Mobility  Outcome Measure Help needed turning from your back to your side while in a flat bed without using bedrails?: A Lot Help needed moving from lying on your back to sitting on the side of a flat bed without using bedrails?: Total Help needed moving to and from a bed to a chair (including a wheelchair)?: Total Help needed standing up from a chair using your arms (e.g., wheelchair or bedside chair)?: Total Help needed to walk in hospital room?: Total Help needed climbing 3-5 steps with a railing? : Total 6 Click Score: 7    End of Session Equipment Utilized During Treatment: Gait belt;Oxygen Activity Tolerance: Patient limited by fatigue Patient left: in chair;with call bell/phone within reach;with chair alarm set;with family/visitor present Nurse Communication: Mobility status PT Visit Diagnosis: Unsteadiness on feet (R26.81);Other abnormalities of gait and mobility (R26.89);Muscle weakness (generalized) (M62.81)    Time: 1335-1406 PT Time Calculation (min) (ACUTE ONLY): 31 min   Charges:   PT Evaluation $PT Eval Moderate Complexity: 1 Mod PT Treatments $Therapeutic Activity: 8-22 mins        [place] [phone]   Shary Decamp [facility] 11/16/2022, 2:26 PM

## 2022-11-16 NOTE — Progress Notes (Addendum)
**Note De-Identified [place] Obfuscation** NAME:  Hailey Hernandez, MRN:  [id number], DOB:  21-Aug-1956, LOS: 6 ADMISSION DATE:  11/02/2022, CONSULTATION DATE:  11/10/22 REFERRING MD:  Betsey Holiday, CHIEF COMPLAINT:  ams   History of Present Illness:  67 yo woman with hx of cirrhosis, who presented 2/9 with AMS, elevated troponin, concern for NSTEMI.  N/V x 2 weeks.   3.7 L removed [place] paracentesis PTA. Prior to paracentesis, sleepy, unwell, vague abdominal pain, nausea. Sx worsened after procedure.  Nausea, no po intake. Mild chest pain, dyspnea. Diarrhea and some abdominal pain recently. Was undergoing a w/u with GI (Seen 2/6)  According to family she had been in declining health since Thanksgiving with poor self-reported quality of life.   Admitted by hospitalist to [facility] initially, Cardiology consulted. Later developed hypotension, bradycardia, obtunded.  Intubated.  Arterial  and central line placed. Started on heparin infusion for NSTEMI.  Marked mixed acidosis on initial ABG  Pertinent  Medical History  Cirrhosis (unk cause, med side effect per family) GERD HLD OSA on cpap 5/15 at home  Hx lung ca CAD HTN Depression COPD On home O2 4L, followed by C [facility].  Significant Hospital Events: Including procedures, antibiotic start and stop dates in addition to other pertinent events   2/9 Admit with AMS, declined >Intubated for obtundation. Hypercapnic respiratory failure 2/10 increasing vasopressor requirements  2/12 vent weaning. Pressor requirements coming down some.  2/13 Still slow to recover neurologically, otherwise weaning well 2/15 SBT ok but poor mental status, diuresing, fevers. Extubated late PM.   Interim History / Subjective:  Cough post extubation, mild upper airway noise s/p decadron, bipap briefly > none since Afebrile / WBC 9.2 UOP 1.8L, 631ml stool, -1.6L in last 24 hours 8L [place]   Objective   Blood pressure 112/60, pulse 92, temperature (!) 97 F (36.1 C), resp. rate (!) 27, height 5\' 2"  (1.575 m),  weight 81.6 kg, SpO2 96 %.    Vent Mode: BIPAP;PCV FiO2 (%):  [40 %-60 %] 50 % Set Rate:  [15 bmp] 15 bmp PEEP:  [5 cmH20-6 cmH20] 6 cmH20 Pressure Support:  [8 cmH20-10 cmH20] 8 cmH20   Intake/Output Summary (Last 24 hours) at 11/16/2022 0911 Last data filed at 11/16/2022 0800 Gross per 24 hour  Intake 700.42 ml  Output 2210 ml  Net -1509.58 ml   Filed Weights   11/14/22 0500 11/15/22 0327 11/16/22 0323  Weight: 83.7 kg 81.4 kg 81.6 kg    Examination:  General: elderly female lying in bed in NAD, husband at bedside   HEENT: MM pink/moist, [place] O2, anicteric, pupils =/reactive  Neuro: Sleeping, awakens to voice, speech clear, hoarse voice CV: s1s2 RRR, frequent PAC's, no m/r/g PULM: non-labored at rest, lungs bilaterally clear  GI: soft, bsx4 active  Extremities: warm/dry, no edema  Skin: no rashes or lesions   Echo: LVEF 55-60%. Grade II DD. Interventricular flattening.   Tracheal aspirate 2/14 > GNR >  BCx2 2/9 >   Assessment & Plan:   Acute hypoxic and hypercarbic respiratory failure requiring mechanical ventilation Chronic hypoxic respiratory failure due to COPD/OSA  on 4L and CPAP at home.  GNR in Tracheal Aspirate, RLL Airspace Disease  -pulmonary hygiene -IS, mobilize  -follow up cultures, note GNR in tracheal aspirate  -continue budesonide, duoneb  -D3 cefepime, plan for 7 days abx with de-escalation as appropriate  -follow intermittent CXR  -SLP evaluation  NSTEMI - likely type 2 demand injury  Possible cor pulmonale, acuity unknown Pulmonary artery hypertension Shock -abx **Note De-Identified [place] Obfuscation** as above  -wean levophed for MAP >65  -remains off dobutamine, co-ox acceptable  -hold DVT prophylaxis given thrombocytopenia  -continue midodrine  -add stress dose steroids  given ongoing vasopressor requirements  -PAC's on monitor, assess EKG to r/o AF  AKI on CKD IIIB Hypokalemia Hypernatremia  -add D5w at 19ml/hr for hypernatremia, marginal intake. Stop once eating regularly.    -Trend BMP / urinary output -Replace electrolytes as indicated -Avoid nephrotoxic agents, ensure adequate renal perfusion  Acute Metabolic Encephalopathy Suspect multifactorial etiology. Potentially lingering effects of sedation in the setting of renal failure, or hypoxemic injury secondary to shock. Cirrhosis seems well compensated, but she did just require large volume paracentesis so maybe not as good as it seems. Uremic? Although not really that elevated.  -minimize all sedating medications as able  -frequent re-orientation   Hepatic cirrhosis likely secondary to NASH with portal hypertension, Childs B Cirrhosis appears well compensated by serologic markers.  -intermittent labs   Thrombocytopenia Macrocytic Anemia  -trend CBC  -monitor for bleeding -hold chemical DVT prophylaxis for now   Moderate Protein Calorie Malnutrition (POA) -NPO until SLP evaluation  -may need cortrak/feeding tube in short term for nutrition   GOC Adult Failure to Thrive  -Family is optimistic she can recover, but understands she is in a difficult situation considering multiple co-morbid conditions and a months long decline prior to this ICU admission. Have elected for DNR.   NSCLC status post Chemoradiotherapy -supportive care  Vascular Access  -has 30 cm R IJ TLC  -early removal once off vasopressors  Hyperglycemia  -change SSI to Q4, sensitive scale   Best Practice (right click and "Reselect all SmartList Selections" daily)  Diet/type: NPO   DVT prophylaxis: systemic heparin GI prophylaxis: PPI Lines: Central line Foley:  Yes, and it is still needed Code Status:  DNR Last date of multidisciplinary goals of care discussion: As above.   Husband updated at bedside 2/16 on plan of care.  Critical Care Time: 35 minutes   Noe Gens, MSN, APRN, NP-C, AGACNP-BC [place] [facility] 11/16/2022, 9:11 AM   Please see [email or web address] for pager details.   From 7A-7P if no  response, please call [phone] After hours, please call ELink [phone]

## 2022-11-16 NOTE — Progress Notes (Signed)
**Note De-Identified [place] Obfuscation** Palliative Medicine Inpatient Follow Up Note HPI: 67 y.o. female  with past medical history of  cirrhosis, COPD on 4L home oxygen, non-small cell lung cancer s/p chemotherapy in 2022, OSA on cpap, CAD, HTN, and HLD who presented to the ED on 11/07/2022 with altered mental status. She was found to have elevated troponin with concern for STEMI. Intubated in the ED. Admitted to PCCM with acute on chronic respiratory failure.    Palliative Medicine was consulted for goals of care.   Today's Discussion 11/16/2022  *Please note that this is a verbal dictation therefore any spelling or grammatical errors are due to the "[place] One" system interpretation.  Chart reviewed inclusive of vital signs, progress notes, laboratory results, and diagnostic images.   I met with Hailey Hernandez this afternoon. She was in the presence of her husband, Hailey Hernandez and daughter, Hailey Hernandez. Hailey Hernandez was resting in her hospital bed though did complain of pain on her backside d/t positioning. Both myself and APP, Theodoro Grist were able to reposition Hailey Hernandez to a place of comfort.   Created space and opportunity for patient and her family to explore thoughts feelings and fears regarding current medical situation. Patients husband has a degree of enthusiasm in the setting of yesterday's extubation. He expresses that he remains hopeful for the future though he does account for a notable decline in patients health over the past 4-5 months. He goes on to share that he is hopeful they can make it to their 50th wedding anniversary on Mary 3rd of this year.  I was able to share with Hailey Hernandez that Hailey Hernandez remains in a tenuous place. We discussed the hopes for improvement but the possibilities of decline as well. He understood this and shares she has poorly functioning lungs, her heart, kidneys, and her liver. He is aware of how ill she is overall and how things may not work out the way he hopes.   We reviewed the patient and families  decision for DNAR and they reconfirmed this.   Questions and concerns addressed/Palliative Support Provided.   Objective Assessment: Vital Signs Vitals:   11/16/22 1145 11/16/22 1200  BP: 97/72 126/66  Pulse: 85 88  Resp: 20 18  Temp: (!) 97.2 F (36.2 C) (!) 97.2 F (36.2 C)  SpO2: 98% 99%    Intake/Output Summary (Last 24 hours) at 11/16/2022 1424 Last data filed at 11/16/2022 1200 Gross per 24 hour  Intake 520.7 ml  Output 1300 ml  Net -779.3 ml   Last Weight  Most recent update: 11/16/2022  3:23 AM    Weight  81.6 kg (179 lb 14.3 oz)            Gen:  Elderly caucasian F chronically ill in appearance HEENT: dry mucous membranes CV: Regular rate and rhythm  PULM: On 8LPM [place], breathing is rapid ABD: soft/nontender  EXT: No edema  Neuro: Alert and oriented x3   SUMMARY OF RECOMMENDATIONS   DNAR  Continue along with full scope of care  Patients spouse aware of patients tenuous clinical state though is hopeful for ongoing improvements  Ongoing PMT support  Billing based on MDM: High ______________________________________________________________________________________ [place] Team Team Cell Phone: [phone] Please utilize secure chat with additional questions, if there is no response within 30 minutes please call the above phone number  Palliative Medicine Team providers are available by phone from 7am to 7pm daily and can be reached through the team cell phone.  Should this patient require **Note De-identified [place] Obfuscation** assistance outside of these hours, please call the patient's attending physician.     

## 2022-11-16 NOTE — Progress Notes (Signed)
**Note De-Identified Mcloud Obfuscation** Physical Therapy Treatment Patient Details Name: Hailey Hernandez MRN: [id number] DOB: 1956-02-21 Today's Date: 11/16/2022   History of Present Illness Pt is 67 year old presented to [facility] on  11/10/22 with acute hypoxic respiratory failure due to COPD/PNA. Pt also with NSTEMI and acute metabolic encephalopathy. Intubated 2/9 and extubated 11/15/22. PMH - copd, cirrhosis, lung CA, CAD, HTN, depression    PT Comments    After pt left in chair she began moving around and sliding down in chair. After 40 minutes had to assist pt back to bed. Used [employer] for safety. Continue to recommend SNF at DC.    Recommendations for follow up therapy are one component of a multi-disciplinary discharge planning process, led by the attending physician.  Recommendations may be updated based on patient status, additional functional criteria and insurance authorization.  Follow Up Recommendations  Skilled nursing-short term rehab (<3 hours/day) Can patient physically be transported by private vehicle: No   Assistance Recommended at Discharge Frequent or constant Supervision/Assistance  Patient can return home with the following Two people to help with walking and/or transfers;Two people to help with bathing/dressing/bathroom;Assistance with cooking/housework;Assist for transportation;Help with stairs or ramp for entrance   Equipment Recommendations  Rolling walker (2 wheels);Wheelchair (measurements PT);Wheelchair cushion (measurements PT)    Recommendations for Other Services       Precautions / Restrictions Precautions Precautions: Fall;Other (comment) Precaution Comments: watch SpO2     Mobility  Bed Mobility Overal bed mobility: Needs Assistance Bed Mobility: Sit to Supine     Supine to sit: +2 for physical assistance, Max assist Sit to supine: +2 for physical assistance, Max assist   General bed mobility comments: Assist to lower trunk and bring legs back up into bed    Transfers Overall  transfer level: Needs assistance Equipment used: Ambulation equipment used Transfers: Sit to/from Stand, Bed to chair/wheelchair/BSC Sit to Stand: +2 physical assistance, Mod assist   Step pivot transfers: +2 physical assistance, Min assist       General transfer comment: Assist to bring hips up and for balance Transfer Bonilla Lift Equipment: Stedy  Ambulation/Gait             Pre-gait activities: side stepped up side of bed with +2 min assist and walker. Short shuffling steps     Stairs             Wheelchair Mobility    Modified Rankin (Stroke Patients Only)       Balance Overall balance assessment: Needs assistance Sitting-balance support: Bilateral upper extremity supported, Feet supported Sitting balance-Leahy Scale: Poor Sitting balance - Comments: UE support and min to mod assist. Pt leans trunk unpredictably   Standing balance support: Bilateral upper extremity supported, During functional activity, Reliant on assistive device for balance Standing balance-Leahy Scale: Poor Standing balance comment: Stedy and min assist for static standing                            Cognition Arousal/Alertness: Awake/alert Behavior During Therapy: Impulsive Overall Cognitive Status: Impaired/Different from baseline Area of Impairment: Attention, Following commands, Safety/judgement, Awareness, Problem solving, Orientation, Memory                   Current Attention Level: Sustained Memory: Decreased short-term memory, Decreased recall of precautions Following Commands: Follows one step commands with increased time, Follows one step commands inconsistently Safety/Judgement: Decreased awareness of safety, Decreased awareness of deficits Awareness: Intellectual Problem Solving: Slow processing, **Note De-Identified [place]ver Obfuscation** Decreased initiation, Requires verbal cues, Requires tactile cues, Difficulty sequencing          Exercises      General Comments General comments (skin  integrity, edema, etc.): VSS on      Pertinent Vitals/Pain Pain Assessment Pain Assessment: Faces Faces Pain Scale: Hurts little more Pain Location: buttocks Pain Descriptors / Indicators: Grimacing Pain Intervention(s): Repositioned, Limited activity within patient's tolerance    Home Living Family/patient expects to be discharged to:: Private residence Living Arrangements: Spouse/significant other Available Help at Discharge: Family;Available 24 hours/day Type of Home: House Home Access: Stairs to enter Entrance Stairs-Rails: None Entrance Stairs-Number of Steps: 3   Home Layout: Able to live on main level with bedroom/bathroom;Laundry or work area in [employer]: None      Prior Function            PT Goals (current goals can now be found in the care plan section) Acute Rehab PT Goals Patient Stated Goal: go home PT Goal Formulation: With patient Time For Goal Achievement: 11/30/22 Potential to Achieve Goals: Good Progress towards PT goals: Progressing toward goals    Frequency    Min 3X/week      PT Plan Current plan remains appropriate    Co-evaluation              AM-PAC PT "6 Clicks" Mobility   Outcome Measure  Help needed turning from your back to your side while in a flat bed without using bedrails?: A Lot Help needed moving from lying on your back to sitting on the side of a flat bed without using bedrails?: Total Help needed moving to and from a bed to a chair (including a wheelchair)?: Total Help needed standing up from a chair using your arms (e.g., wheelchair or bedside chair)?: Total Help needed to walk in hospital room?: Total Help needed climbing 3-5 steps with a railing? : Total 6 Click Score: 7    End of Session Equipment Utilized During Treatment: Oxygen Activity Tolerance: Patient limited by fatigue Patient left: with call bell/phone within reach;with family/visitor present;in bed;with nursing/sitter in room Nurse  Communication: Mobility status;Need for lift equipment (Nurse assisted with transfer) PT Visit Diagnosis: Unsteadiness on feet (R26.81);Other abnormalities of gait and mobility (R26.89);Muscle weakness (generalized) (M62.81)     Time: 3299-2426 PT Time Calculation (min) (ACUTE ONLY): 14 min  Charges:  $Therapeutic Activity: 8-22 mins                     [place] 11/16/2022, 5:44 PM

## 2022-11-16 NOTE — Progress Notes (Signed)
**Note De-Identified Demetriou Obfuscation** RT NOTE:   Pt is finally settled and resting. Holding CPT at this time.

## 2022-11-16 NOTE — Progress Notes (Signed)
**Note De-Identified Hailey Hernandez** Nutrition Follow-up  DOCUMENTATION CODES:   Obesity unspecified  INTERVENTION:   Tube Feeding [name] Cortrak: Nocturnal TF Osmolite 1.5 at 65 ml/hr x 14 hours This provides 1365 kcals, 57 g of protein and 691 mL of free water  Magic cup TID with meals, each supplement provides 290 kcal and 9 grams of protein  Feeding assistance and encouragement at meal times   NUTRITION DIAGNOSIS:   Inadequate oral intake related to inability to eat as evidenced by NPO status.  Being addressed Hailey Hernandez TF, supplements, po diet  GOAL:   Patient will meet greater than or equal to 90% of their needs  Progressing  MONITOR:   Vent status, Weight trends, Labs, TF tolerance, I & O's  REASON FOR ASSESSMENT:   Consult Enteral/tube feeding initiation and management  ASSESSMENT:   67 year old female with PMHx of cirrhosis, GERD, HLD, OSA on CPAP, hx lung cancer, CAD, HTN, depression, COPD on home O2 admitted with AMS, elevated troponin, concern for NSTEMI.  2/08 Paracentesis with 3.6 L  2/09 Admitted, Obtunded, Intubated 2/10 TF initiated 2/15 Extubated 2/16 Dysphagia 1, Honey Thick; Cortrak placed-nocturnal TF  Palliative care following, pt is DNR  SLP able to advance diet to Dysphagia 1, Honey Thick but team decided pt would likely still benefit from Cortrak placement. No recorded po intake of meals yet  Tolerating TF at rate prior to extubation  Noted hypernatremia being addressed Hailey Hernandez D5  Labs: sodium 150 (H), Creatinine 1.29, BUN 70 Meds: ss novolog, D5 at 75 ml/hr, midodrine   Diet Order:   Diet Order             DIET - DYS 1 Room service appropriate? Yes with Assist; Fluid consistency: Honey Thick  Diet effective now                   EDUCATION NEEDS:   Not appropriate for education at this time  Skin:  Skin Assessment: Skin Integrity Issues: Skin Integrity Issues:: DTI DTI: coccyx  Last BM:  2/16  Height:   Ht Readings from Last 1 Encounters:  11/14/22 5\' 2"   (1.575 m)    Weight:   Wt Readings from Last 1 Encounters:  11/16/22 81.6 kg    Ideal Body Weight:  50 kg  BMI:  Body mass index is 32.9 kg/m.  Estimated Nutritional Needs:   Kcal:  1750-1950 kcals  Protein:  100-125 g  Fluid:  >/= 1.8 L   Hailey Passey MS, RDN, LDN, CNSC Registered Dietitian 3 Clinical Nutrition RD Pager and On-Call Pager Number Located in [place]

## 2022-11-16 NOTE — Progress Notes (Signed)
**Note De-Identified Majchrzak Obfuscation** Pharmacy Antibiotic Note  Hailey Hernandez is a 67 y.o. female admitted on 11/27/2022 with pneumonia.  She is on cefepime for serratia PNA but cultures also show stenotrophomonas maltophilia. Pharmacy has been consulted for bactrim dosing -WBC= 9.1, tmax= 100.8 -SCr= 1.28, CrCl ~ 40   Plan: -Bactrim 800mg  IV q8h (10mg /kg q8h) -Will follow renal function, cultures and clinical progress    Height: 5\' 2"  (157.5 cm) Weight: 81.6 kg (179 lb 14.3 oz) IBW/kg (Calculated) : 50.1  Temp (24hrs), Avg:98.5 F (36.9 C), Min:96.8 F (36 C), Max:99.9 F (37.7 C)  Recent Labs  Lab 11/14/2022 2140 11/25/2022 2357 11/10/22 0431 11/10/22 0936 11/10/22 1558 11/12/22 0443 11/13/22 0418 11/14/22 0359 11/15/22 0320 11/16/22 0322  WBC 7.6  --    < >  --    < > 12.4* 10.4 11.4* 7.1 9.2  CREATININE 3.84*  --    < >  --    < > 3.85* 2.54* 1.80* 1.32* 1.29*  LATICACIDVEN 1.2 1.7  --  1.0  --   --   --   --   --   --   VANCORANDOM  --   --   --   --   --  16  --   --   --   --    < > = values in this interval not displayed.     Estimated Creatinine Clearance: 41.3 mL/min (A) (by C-G formula based on SCr of 1.29 mg/dL (H)).    Allergies  Allergen Reactions   Doxycycline Shortness Of Breath and Other (See Comments)    Drug interaction with Soriatane   Tramadol Shortness Of Breath    Did not work   Milnacipran     REACTION: dizzy   Apremilast Rash    Rash, able to take this currently    Antimicrobials this admission: 2/10 Azith >> 2/14 2/9 Rocephin >> 2/14 2/10 Vanc >> 2/15 2/14 Cefepime >> 2/16 bactrim>>  Microbiology results: 2/10 blood x2- ngtd 2/9 blood x2- CONS 1/4  2/14 Trach asp - serratia, tenotrophomonas maltophilia.  Thank you for allowing pharmacy to be a part of this patient's care.  Hildred Laser, PharmD Clinical Pharmacist **Pharmacist phone directory can now be found on [email or web address] ([email or web address]).  Listed under [place].

## 2022-11-16 NOTE — Procedures (Signed)
**Note De-Identified Th[place]by Obfuscation** Cortrak  Person Inserting Tube:  Alroy Dust, Erian Rosengren L, RD Tube Type:  Cortrak - 43 inches Tube Size:  10 Tube Location:  Left nare Secured by: Bridle Technique Used to Measure Tube Placement:  Marking at nare/corner of mouth Cortrak Secured At:  65 cm   Cortrak Tube Team Note:  Consult received to place a Cortrak feeding tube.   X-ray is required, abdominal x-ray has been ordered by the Cortrak team. Please confirm tube placement before using the Cortrak tube.   If the tube becomes dislodged please keep the tube and contact the Cortrak team at [email or web address] for replacement.  If after hours and replacement cannot be delayed, place a NG tube and confirm placement with an abdominal x-ray.    Hermina Barters RD, LDN Clinical Dietitian See Shea Evans for contact information.

## 2022-11-16 NOTE — Evaluation (Signed)
**Note De-Identified [place] Obfuscation** Clinical/Bedside Swallow Evaluation Patient Details  Name: Hailey Hernandez MRN: [id number] Date of Birth: 07/31/1956  Today's Date: 11/16/2022 Time: SLP Start Time (ACUTE ONLY): 32 SLP Stop Time (ACUTE ONLY): 1751 SLP Time Calculation (min) (ACUTE ONLY): 37 min  Past Medical History:  Past Medical History:  Diagnosis Date   Allergy    Anxiety disorder    Arthritis    bil legs   Asthma    Cancer ([place])    Chronic bronchitis    Cirrhosis ([place])    Complication of anesthesia    slow to wake up, one time she turned blue in recovery   COPD (chronic obstructive pulmonary disease) (HCC)    Depression    Diverticulitis    DJD (degenerative joint disease)    spine   Dyspnea    O2 at 3 L 24/7   Fibromyalgia    GERD (gastroesophageal reflux disease)    Glaucoma    History of thyroid cancer    HTN (hypertension)    Hyperlipidemia    Impaired glucose tolerance 05/08/2013   Lung cancer (HCC)    OSA (obstructive sleep apnea)    CPAP  last sleep study 8-10 yr.ag0   Polymyalgia ([place])    Pre-diabetes    Psoriasis    Rhinitis    Tobacco abuse    Past Surgical History:  Past Surgical History:  Procedure Laterality Date   APPENDECTOMY     BRONCHIAL BIOPSY  01/24/2021   Procedure: BRONCHIAL BIOPSIES;  Surgeon: Garner Nash, DO;  Location: [facility];  Service: Pulmonary;;   BRONCHIAL BRUSHINGS  01/24/2021   Procedure: BRONCHIAL BRUSHINGS;  Surgeon: Garner Nash, DO;  Location: [facility];  Service: Pulmonary;;   BRONCHIAL NEEDLE ASPIRATION BIOPSY  01/24/2021   Procedure: BRONCHIAL NEEDLE ASPIRATION BIOPSIES;  Surgeon: Garner Nash, DO;  Location: [facility];  Service: Pulmonary;;   CARPAL TUNNEL RELEASE  10/30/2011   Procedure: CARPAL TUNNEL RELEASE;  Surgeon: Wynonia Sours, MD;  Location: [facility];  Service: Orthopedics;  Laterality: Right;  and mass excision   CHOLECYSTECTOMY     CHOLESTEATOMA EXCISION     right ear   IR PARACENTESIS  11/08/2022    left shoulder spurs     ORIF right lower leg     partial throidectomy     TOTAL ABDOMINAL HYSTERECTOMY     VIDEO BRONCHOSCOPY WITH ENDOBRONCHIAL NAVIGATION N/A 01/24/2021   Procedure: VIDEO BRONCHOSCOPY WITH ENDOBRONCHIAL NAVIGATION;  Surgeon: Garner Nash, DO;  Location: [facility];  Service: Pulmonary;  Laterality: N/A;   VIDEO BRONCHOSCOPY WITH ENDOBRONCHIAL ULTRASOUND N/A 01/24/2021   Procedure: VIDEO BRONCHOSCOPY WITH ENDOBRONCHIAL ULTRASOUND;  Surgeon: Garner Nash, DO;  Location: [facility];  Service: Pulmonary;  Laterality: N/A;   HPI:  Pt is 67 year old presented to [facility] on  11/10/22 with acute hypoxic respiratory failure due to COPD/PNA. Pt also with NSTEMI and acute metabolic encephalopathy. Intubated 2/9 and extubated 11/15/22. PMH - copd, cirrhosis, lung CA, CAD, HTN, depression    Assessment / Plan / Recommendation  Clinical Impression  Pt presents with s/s of a post-extubation dysphagia marked by consistent coughing after consumption of thin and nectar-thick liquids.  She was highly motivated to eat and consumed a container of pudding and sips of honey-thick liquid with excellent overall toleration.  Oral mechanism exam was normal.  Voice was dysphonic. Attempted to place dentures but they did not fit well so they were returned to container with cleaner applied.  Pt's **Note De-Identified [place] Obfuscation** daughter and granddaughter were present. We discussed reversible dysphagias that can occur after several days of intubation and prognosis for recovery with time. D/W RN. Recommend dysphagia 1, honey-thick liquids. SLP will follow for safety/diet advancement and potential value of instrumental swallow study at later date. SLP Visit Diagnosis: Dysphagia, unspecified (R13.10)    Aspiration Risk    tba   Diet Recommendation   Dysphagia 1/honey thick liquids  Medication Administration: Whole meds with puree    Other  Recommendations Oral Care Recommendations: Oral care BID    Recommendations for follow up  therapy are one component of a multi-disciplinary discharge planning process, led by the attending physician.  Recommendations may be updated based on patient status, additional functional criteria and insurance authorization.  Follow up Recommendations Other (comment) (tba)              Frequency and Duration min 2x/week  2 weeks       Prognosis Prognosis for improved oropharyngeal function: Good      Swallow Study   General Date of Onset: 11/10/22 HPI: Pt is 67 year old presented [facility] on  11/10/22 with acute hypoxic respiratory failure due to COPD/PNA. Pt also with NSTEMI and acute metabolic encephalopathy. Intubated 2/9 and extubated 11/15/22. PMH - copd, cirrhosis, lung CA, CAD, HTN, depression Type of Study: Bedside Swallow Evaluation Previous Swallow Assessment: no Diet Prior to this Study: NPO Temperature Spikes Noted: No Respiratory Status: Nasal cannula (8L) History of Recent Intubation: Yes Total duration of intubation (days): 6 days Date extubated: 11/15/22 Behavior/Cognition: Alert Oral Cavity Assessment: Within Functional Limits Oral Care Completed by SLP: Recent completion by staff Oral Cavity - Dentition: Edentulous Self-Feeding Abilities: Needs assist Patient Positioning: Upright in bed Baseline Vocal Quality: Breathy Volitional Cough: Strong Volitional Swallow: Able to elicit    Oral/Motor/Sensory Function Overall Oral Motor/Sensory Function: Within functional limits   Ice Chips Ice chips: Within functional limits   Thin Liquid Thin Liquid: Impaired Presentation: Cup Pharyngeal  Phase Impairments: Cough - Immediate    Nectar Thick Nectar Thick Liquid: Impaired Pharyngeal Phase Impairments: Cough - Immediate   Honey Thick Honey Thick Liquid: Within functional limits   Puree Puree: Within functional limits   Solid     Solid: Not tested      Juan Quam Laurice 11/16/2022,12:39 PM  Estill Bamberg L. Tivis Ringer, MA CCC/SLP Clinical Specialist - [place] Office number [phone]

## 2022-11-16 NOTE — Progress Notes (Signed)
**Note De-Identified Bischoff Obfuscation** Pharmacy Antibiotic Note  Hailey Hernandez is a 67 y.o. female admitted on 11/14/2022 with pneumonia.  Pharmacy has been consulted for Cefepime dosing. -day 3 of antibiotics -respiratory cultures growing GNR -WBC=  9, afeb, CrCl ~ 40  Plan: Continue Cefepime 2gm IV q12h Will f/u renal function, micro data, and pt's clinical condition    Height: 5\' 2"  (157.5 cm) Weight: 81.6 kg (179 lb 14.3 oz) IBW/kg (Calculated) : 50.1  Temp (24hrs), Avg:98.7 F (37.1 C), Min:97 F (36.1 C), Max:99.9 F (37.7 C)  Recent Labs  Lab 11/22/2022 2140 11/23/2022 2357 11/10/22 0431 11/10/22 0936 11/10/22 1558 11/12/22 0443 11/13/22 0418 11/14/22 0359 11/15/22 0320 11/16/22 0322  WBC 7.6  --    < >  --    < > 12.4* 10.4 11.4* 7.1 9.2  CREATININE 3.84*  --    < >  --    < > 3.85* 2.54* 1.80* 1.32* 1.29*  LATICACIDVEN 1.2 1.7  --  1.0  --   --   --   --   --   --   VANCORANDOM  --   --   --   --   --  16  --   --   --   --    < > = values in this interval not displayed.     Estimated Creatinine Clearance: 41.3 mL/min (A) (by C-G formula based on SCr of 1.29 mg/dL (H)).    Allergies  Allergen Reactions   Doxycycline Shortness Of Breath and Other (See Comments)    Drug interaction with Soriatane   Tramadol Shortness Of Breath    Did not work   Milnacipran     REACTION: dizzy   Apremilast Rash    Rash, able to take this currently    Antimicrobials this admission: 2/10 Azith >> 2/14 2/9 Rocephin >> 2/14 2/10 Vanc >>  2/15 2/14 Cefepime >>  Microbiology results: 2/10 blood x2- neg 2/9 blood x2- CONS 1/4  2/14 Trach asp - moderate GNR  Thank you for allowing pharmacy to be a part of this patient's care.  Hildred Laser, PharmD Clinical Pharmacist **Pharmacist phone directory can now be found on [email or web address] ([email or web address]).  Listed under [place].

## 2022-11-16 NOTE — Progress Notes (Signed)
**Note De-Identified Lui Obfuscation** [place] Progress Note Patient Name: Hailey Hernandez DOB: [birth year] MRN: [id number]   Date of Service  11/16/2022  HPI/Events of Note  Nurse requesting pain medication for stage 3 decubitus ulcer causing 7/10 pain unresponsive to tylenol.  eICU Interventions  Oxycodone 5mg  every 8 hrs prn ordered     Intervention Category Intermediate Interventions: Pain - evaluation and management  Mauri Brooklyn, P 11/16/2022, 10:14 PM

## 2022-11-17 DIAGNOSIS — Z515 Encounter for palliative care: Secondary | ICD-10-CM | POA: Diagnosis not present

## 2022-11-17 DIAGNOSIS — J9601 Acute respiratory failure with hypoxia: Secondary | ICD-10-CM | POA: Diagnosis not present

## 2022-11-17 DIAGNOSIS — Z7189 Other specified counseling: Secondary | ICD-10-CM | POA: Diagnosis not present

## 2022-11-17 DIAGNOSIS — I959 Hypotension, unspecified: Secondary | ICD-10-CM | POA: Diagnosis not present

## 2022-11-17 DIAGNOSIS — R579 Shock, unspecified: Secondary | ICD-10-CM | POA: Diagnosis not present

## 2022-11-17 LAB — COMPREHENSIVE METABOLIC PANEL
ALT: 23 U/L (ref 0–44)
AST: 31 U/L (ref 15–41)
Albumin: 2.2 g/dL — ABNORMAL LOW (ref 3.5–5.0)
Alkaline Phosphatase: 95 U/L (ref 38–126)
Anion gap: 9 (ref 5–15)
BUN: 77 mg/dL — ABNORMAL HIGH (ref 8–23)
CO2: 28 mmol/L (ref 22–32)
Calcium: 8.4 mg/dL — ABNORMAL LOW (ref 8.9–10.3)
Chloride: 103 mmol/L (ref 98–111)
Creatinine, Ser: 1.65 mg/dL — ABNORMAL HIGH (ref 0.44–1.00)
GFR, Estimated: 34 mL/min — ABNORMAL LOW (ref >60)
Glucose, Bld: 180 mg/dL — ABNORMAL HIGH (ref 70–99)
Potassium: 4.2 mmol/L (ref 3.5–5.1)
Sodium: 140 mmol/L (ref 135–145)
Total Bilirubin: 0.1 mg/dL — ABNORMAL LOW (ref 0.3–1.2)
Total Protein: 6.4 g/dL — ABNORMAL LOW (ref 6.5–8.1)

## 2022-11-17 LAB — CBC
HCT: 28.4 % — ABNORMAL LOW (ref 36.0–46.0)
Hemoglobin: 8.4 g/dL — ABNORMAL LOW (ref 12.0–15.0)
MCH: 32.4 pg (ref 26.0–34.0)
MCHC: 29.6 g/dL — ABNORMAL LOW (ref 30.0–36.0)
MCV: 109.7 fL — ABNORMAL HIGH (ref 80.0–100.0)
Platelets: 87 10*3/uL — ABNORMAL LOW (ref 150–400)
RBC: 2.59 MIL/uL — ABNORMAL LOW (ref 3.87–5.11)
RDW: 16.2 % — ABNORMAL HIGH (ref 11.5–15.5)
WBC: 11.5 10*3/uL — ABNORMAL HIGH (ref 4.0–10.5)
nRBC: 0 % (ref 0.0–0.2)

## 2022-11-17 LAB — CULTURE, RESPIRATORY W GRAM STAIN

## 2022-11-17 LAB — GLUCOSE, CAPILLARY
Glucose-Capillary: 157 mg/dL — ABNORMAL HIGH (ref 70–99)
Glucose-Capillary: 167 mg/dL — ABNORMAL HIGH (ref 70–99)
Glucose-Capillary: 172 mg/dL — ABNORMAL HIGH (ref 70–99)
Glucose-Capillary: 196 mg/dL — ABNORMAL HIGH (ref 70–99)
Glucose-Capillary: 226 mg/dL — ABNORMAL HIGH (ref 70–99)
Glucose-Capillary: 91 mg/dL (ref 70–99)

## 2022-11-17 MED ORDER — FUROSEMIDE 10 MG/ML IJ SOLN
20.0000 mg | Freq: Once | INTRAMUSCULAR | Status: AC
  Administered 2022-11-17: 20 mg via INTRAVENOUS
  Filled 2022-11-17: qty 2

## 2022-11-17 MED ORDER — ALBUMIN HUMAN 5 % IV SOLN
25.0000 g | Freq: Once | INTRAVENOUS | Status: AC
  Administered 2022-11-17: 25 g via INTRAVENOUS
  Filled 2022-11-17: qty 500

## 2022-11-17 MED ORDER — ACETAMINOPHEN 160 MG/5ML PO SOLN
650.0000 mg | Freq: Four times a day (QID) | ORAL | Status: DC | PRN
  Administered 2022-11-17: 650 mg
  Filled 2022-11-17: qty 20.3

## 2022-11-17 MED ORDER — MIDODRINE HCL 5 MG PO TABS
10.0000 mg | ORAL_TABLET | Freq: Three times a day (TID) | ORAL | Status: DC
  Administered 2022-11-17 – 2022-11-18 (×2): 10 mg
  Filled 2022-11-17 (×2): qty 2

## 2022-11-17 MED ORDER — POLYETHYLENE GLYCOL 3350 17 G PO PACK: 17.0000 g | PACK | Freq: Every day | ORAL | Status: DC | PRN

## 2022-11-17 MED ORDER — DOCUSATE SODIUM 50 MG/5ML PO LIQD: 100.0000 mg | Freq: Two times a day (BID) | ORAL | Status: DC | PRN

## 2022-11-17 MED ORDER — FREE WATER
200.0000 mL | Status: DC
  Administered 2022-11-17 – 2022-11-18 (×6): 200 mL

## 2022-11-17 MED ORDER — OXYCODONE HCL 5 MG PO TABS
5.0000 mg | ORAL_TABLET | Freq: Three times a day (TID) | ORAL | Status: DC | PRN
  Administered 2022-11-18: 5 mg
  Filled 2022-11-17: qty 1

## 2022-11-17 MED ORDER — IPRATROPIUM-ALBUTEROL 0.5-2.5 (3) MG/3ML IN SOLN
3.0000 mL | Freq: Four times a day (QID) | RESPIRATORY_TRACT | Status: DC
  Administered 2022-11-18 – 2022-11-19 (×3): 3 mL via RESPIRATORY_TRACT
  Filled 2022-11-17 (×4): qty 3

## 2022-11-17 NOTE — Progress Notes (Signed)
**Note De-Identified [place]chione Obfuscation** Speech Language Pathology Treatment: Dysphagia  Patient Details Name: Hailey Hernandez MRN: [id number] DOB: July 24, 1956 Today's Date: 11/17/2022 Time: 8333-8329 SLP Time Calculation (min) (ACUTE ONLY): 16 min  Assessment / Plan / Recommendation Clinical Impression  Pt was seen for skilled ST targeting diet tolerance.  Pt was encountered awake/alert with family present at beside.  Daughter reported that pt consumed 2oz of honey-thick liquid and puree this morning.  She stated that the pt exhibited immediate and delayed coughing with all PO consumption.  She additionally reported that the pt's voice was more breathy and was weaker today in comparison to yesterday.  Pt was seen with trials of honey-thick liquid and ice chips.  She exhibited immediate coughing with honey-thick liquid trials.  Cough was prolonged and congested with pt able to mobilize some sputum to oral cavity for suction.  No overt s/sx of aspiration observed with small ice chips.  Pt was not seen with trials of puree secondary to pt reports of abdominal pain; however, family reported coughing with puree during breakfast.  Recommend diet downgrade to NPO with ice chips PRN following oral care and continuation of alternative means of nutrition.  Pt may benefit from an instrumental swallow study if s/sx of aspiration persist.     HPI HPI: Pt is 67 year old presented to [facility] on  11/10/22 with acute hypoxic respiratory failure due to COPD/PNA. Pt also with NSTEMI and acute metabolic encephalopathy. Intubated 2/9 and extubated 11/15/22. PMH - copd, cirrhosis, lung CA, CAD, HTN, depression      SLP Plan  Goals updated      Recommendations for follow up therapy are one component of a multi-disciplinary discharge planning process, led by the attending physician.  Recommendations may be updated based on patient status, additional functional criteria and insurance authorization.    Recommendations  Diet recommendations: NPO Medication  Administration: [place] alternative means                Oral Care Recommendations: Oral care QID;Oral care prior to ice chip/H20 Follow Up Recommendations: Other (comment) (TBD) SLP Visit Diagnosis: Dysphagia, unspecified (R13.10) Plan: Goals updated         Bretta Bang, M.S., [place] [facility] Office: [phone]   [place]  11/17/2022, 9:40 AM

## 2022-11-17 NOTE — Progress Notes (Signed)
**Note De-Identified Haydel Obfuscation** NAME:  Hailey Hernandez, MRN:  [id number], DOB:  Feb 04, 1956, LOS: 7 ADMISSION DATE:  11/14/2022, CONSULTATION DATE:  11/10/22 REFERRING MD:  Betsey Holiday, CHIEF COMPLAINT:  ams   History of Present Illness:  67 yo woman with hx of cirrhosis, who presented 2/9 with AMS, elevated troponin, concern for NSTEMI.  N/V x 2 weeks.   3.7 L removed [place]re[place] paracentesis PTA. Prior to paracentesis, sleepy, unwell, vague abdominal pain, nausea. Sx worsened after procedure.  Nausea, no po intake. Mild chest pain, dyspnea. Diarrhea and some abdominal pain recently. Was undergoing a w/u with GI (Seen 2/6)  According to family she had been in declining health since Thanksgiving with poor self-reported quality of life.   Admitted by hospitalist to [facility] initially, Cardiology consulted. Later developed hypotension, bradycardia, obtunded.  Intubated.  Arterial  and central line placed. Started on heparin infusion for NSTEMI.  Marked mixed acidosis on initial ABG  Pertinent  Medical History  Cirrhosis (unk cause, med side effect per family) GERD HLD OSA on cpap 5/15 at home  Hx lung ca CAD HTN Depression COPD On home O2 4L, followed by [facility].  Significant Hospital Events: Including procedures, antibiotic start and stop dates in addition to other pertinent events   2/9 Admit with AMS, declined >Intubated for obtundation. Hypercapnic respiratory failure 2/10 increasing vasopressor requirements  2/12 vent weaning. Pressor requirements coming down some.  2/13 Still slow to recover neurologically, otherwise weaning well 2/15 SBT ok but poor mental status, diuresing, fevers. Extubated late PM.   Interim History / Subjective:  More distended today. Pain better controlled this morning.  Objective   Blood pressure (!) 94/51, pulse 99, temperature 97.9 F (36.6 C), resp. rate (!) 21, height 5\' 2"  (1.575 m), weight 81.6 kg, SpO2 99 %.    Vent Mode: BIPAP;PCV FiO2 (%):  [60 %] 60 % Set Rate:  [15 bmp] 15  bmp PEEP:  [6 cmH20] 6 cmH20   Intake/Output Summary (Last 24 hours) at 11/17/2022 0922 Last data filed at 11/17/2022 0800 Gross per 24 hour  Intake 4825.93 ml  Output 720 ml  Net 4105.93 ml    Filed Weights   11/15/22 0327 11/16/22 0323 11/17/22 0500  Weight: 81.4 kg 81.6 kg 81.6 kg    Examination:  General: chronically ill appearing woman lying in bed in NAD   HEENT: [place]/AT, eyes anicteric Neuro: sleeping but arouses to verbal stimulation CV: S1S2, reg rate, irreg rhythm PULM: breathing comfortably on [place], CTA GI: soft, NT, no fluid wave Extremities: mild peripheral edema, no cyanosis Skin: warm, dry  Echo: LVEF 55-60%. Grade II DD. Interventricular flattening.   Tracheal aspirate 2/14 > GNR > mod Serratia & Stenotrophomonas BCx2 2/9 >  BUN 77 Cr 1.65 WBC 11.5 H/H 8.4/38.4 Platelets 87   Assessment & Plan:   Acute on chronic respiratory failure with hypoxia, on 4L O2 PTA Pneumonia, likely aspiration in origin- Serratia, stenotrophomonas Post-extubation stridor -pulmonary hygiene -Bactrim + cefepime; total 7 day course -bronchodilators   Acute encephalopathy, metabolic due to liver disease, ICU delirium -sleep hygiene  -family visitation- husband at bedside this morning -avoid deliriogenic meds -pain control   Hypernatremia, resolved -stop D5w; add enteral FW -monitor   Hypotension; concern for adrenal insufficiency -NE to maintain MAP >65; wean as able -midodrine TID -con't stress dose steroids   AKI; seems like baseline renal function worsening as an outpatient the past few months. Baseline CKD 3b -strict I/O -renally dose meds, avoid nephrotoxic meds -albumin today, **Note De-Identified Esselman Obfuscation** maintain adequate renal perfusion -hold spironolactone and ACE until back to baseline renal function  FTT -cortrak for TF-- restart today -PT, OT   NASH cirrhosis -not on PTA lactulose; can resume PRN if constipation -may need repeat para if abdominal distention worsens     Thrombocytopenia; likely due to acute infection, antibiotics, cirrhosis -hold chemical Dvt prophylaxis until platelets >100k   Hyperglycemia - SSI PRN -goal BG 140-180  Sacral decubitus ulcer Moderate protein energy malnutrition -frequent turns -optimize nutrition -wean pressors as able -pain control -wound care  Previous type II MI, demand ischemia Unknown chronicity of cor pulmonale PAH- worry about portopulmonary HTN from cirrhosis -supportive care -norepi to maintain MAP >65  H/o NSCLC status post Chemoradiotherapy -supportive care  Husband updated at bedside. Ongoing tenuous status, requires ongoing ICU care. Long ter,prognosis guarded. -Family is optimistic she can recover, but understands she is in a difficult situation considering multiple co-morbid conditions and a months long decline prior to this ICU admission. Have elected for DNR.    Best Practice (right click and "Reselect all SmartList Selections" daily)  Diet/type: NPO   DVT prophylaxis: SCD GI prophylaxis: PPI Lines: Central line Foley:  Yes, and it is still needed Code Status:  DNR Last date of multidisciplinary goals of care discussion: As above.   Husband updated at bedside 2/17 on plan of care.  Critical Care Time:     This patient is critically ill with multiple organ system failure which requires frequent high complexity decision making, assessment, support, evaluation, and titration of therapies. This was completed through the application of advanced monitoring technologies and extensive interpretation of multiple databases. During this encounter critical care time was devoted to patient care services described in this note for 45 minutes.  Julian Hy, DO 11/17/22 10:22 AM [facility]  For contact information, see Amion. If no response to pager, please call PCCM consult pager. After hours, 7PM- 7AM, please call Elink.

## 2022-11-17 NOTE — IPAL (Addendum)
**Note De-identified [place] Obfuscation**  **Note De-Identified [place]eman Obfuscation** Interdisciplinary Goals of Care Family Meeting   Date carried out:: 11/17/2022  Location of the meeting: Conference room  Member's involved: Physician, Family Member or next of kin, and Palliative care team member  Durable Power of Attorney or acting medical decision maker: husband    Discussion: We discussed goals of care for Hailey Hernandez .  I met with Hailey Hernandez husband, multiple family members including 2 adult children and the Palliative Care Medicine team.  We discussed her multiple chronic medical issues and her lack of significant ongoing improvement.  We are worried about her eventual long-term ability to recover from this illness with her multiple severe medical diseases. We discussed the option of pursuing comfort-focused care. Her family is unsure how they want to proceed, but will discuss as a family. All questions were answered.  Code status: Full DNR  Disposition: Continue current acute care; further discussions needed as family needed time to talk   Time spent for the meeting: 20 min.  Julian Hy 11/17/2022, 2:27 PM

## 2022-11-17 NOTE — Progress Notes (Signed)
**Note De-Identified Artola Obfuscation** Daily Progress Note   Patient Name: Hailey Hernandez       Date: 11/17/2022 DOB: [birth year]  Age: 67 y.o. MRN#: [id number] Attending Physician: Julian Hy, DO Primary Care Physician: [name], DO Admit Date: 11/08/2022  Reason for Consultation/Follow-up: Establishing goals of care  HPI/Brief Hospital Review: 67 y.o. female  with past medical history of  cirrhosis, COPD on 4L home oxygen, non-small cell lung cancer s/p chemotherapy in 2022, OSA on cpap, CAD, HTN, and HLD who presented to the ED on 11/04/2022 with altered mental status. She was found to have elevated troponin with concern for STEMI. Intubated in the ED. Admitted to PCCM with acute on chronic respiratory failure.   Palliative Medicine consulted for assisting with goals of care conversations.  Subjective: Extensive chart review has been completed prior to meeting patient including labs, vital signs, imaging, progress notes, orders, and available advanced directive documents from current and previous encounters.    Visited with Hailey Hernandez at her bedside. Drowsy on exam, much increased from assessment 2/16. Awakens to voice but quickly drifts back to sleep. Family visiting at bedside. Husband asked to meet outside room and voiced concerns of not understanding/comprehending full medical care/diagnosis/treatment/prognosis. At that time, arranged in person meeting with all family present and Dr. Carlis Abbott.  Meeting held-Dr. Carlis Abbott able to explain current medical situation and emphasized complexity and severity of situation in relation to multiple chronic medical conditions and comorbid conditions. Dr. Carlis Abbott further explained since extubation. Hailey Hernandez has not had significant improvements in overall condition. Today, condition tenuous with  noted decline in respiratory status.  Sharyn Lull, NP with PMT and myself presesnted transitioning to comfort focused care. Family expressed understanding-husband with questions and concerns that were addressed. At that time, family wished to discuss more amongst themselves. Later contacted by husband that he wished to involved his other daughter in these conversations and she planned to visit the hospital later this evening.  Daughter unable to visit due to car troubles-planned to met with Sharyn Lull, NP with PMT 2/18.  Encouraged family to consider goals in the event Hailey Hernandez decompensates overnight. Family in agreement to not escalate care in the event of decompensation and to transition to comfort care at that time.   Objective:       Vital Signs: BP (!) 101/48 (BP **Note De-Identified Corwin Obfuscation** Location: Left Arm)   Pulse (!) 102   Temp 98.2 F (36.8 C)   Resp (!) 23   Ht 5\' 2"  (1.575 m)   Wt 81.6 kg   SpO2 94%   BMI 32.90 kg/m  SpO2: SpO2: 94 % O2 Device: O2 Device: High Flow Nasal Cannula (salter) O2 Flow Rate: O2 Flow Rate (L/min): 9 L/min   Palliative Care Assessment & Plan   Assessment/Recommendation/Plan  DNR No escalation of care in the event of decompensation and transition to comfort care at that time Plan for meeting 2/18 PMT to continue to follow for ongoing support   Care plan was discussed with Dr. Carlis Abbott and nursing staff.  Thank you for allowing the Palliative Medicine Team to assist in the care of this patient.  Total time:  50 minutes  Greater than 50%  of this time was spent counseling and coordinating care related to the above assessment and plan.  Theodoro Grist, DNP, AGNP-C Palliative Medicine   Please contact Palliative Medicine Team phone at [phone] for questions and concerns.

## 2022-11-18 DIAGNOSIS — R579 Shock, unspecified: Secondary | ICD-10-CM | POA: Diagnosis not present

## 2022-11-18 DIAGNOSIS — J9622 Acute and chronic respiratory failure with hypercapnia: Secondary | ICD-10-CM | POA: Diagnosis not present

## 2022-11-18 DIAGNOSIS — Z7189 Other specified counseling: Secondary | ICD-10-CM | POA: Diagnosis not present

## 2022-11-18 DIAGNOSIS — J441 Chronic obstructive pulmonary disease with (acute) exacerbation: Secondary | ICD-10-CM

## 2022-11-18 DIAGNOSIS — N179 Acute kidney failure, unspecified: Secondary | ICD-10-CM | POA: Diagnosis not present

## 2022-11-18 DIAGNOSIS — Z515 Encounter for palliative care: Secondary | ICD-10-CM | POA: Diagnosis not present

## 2022-11-18 LAB — GLUCOSE, CAPILLARY
Glucose-Capillary: 100 mg/dL — ABNORMAL HIGH (ref 70–99)
Glucose-Capillary: 75 mg/dL (ref 70–99)
Glucose-Capillary: 156 mg/dL — ABNORMAL HIGH (ref 70–99)
Glucose-Capillary: 143 mg/dL — ABNORMAL HIGH (ref 70–99)

## 2022-11-18 LAB — CBC
HCT: 26.5 % — ABNORMAL LOW (ref 36.0–46.0)
Hemoglobin: 8 g/dL — ABNORMAL LOW (ref 12.0–15.0)
MCH: 32.3 pg (ref 26.0–34.0)
MCHC: 30.2 g/dL (ref 30.0–36.0)
MCV: 106.9 fL — ABNORMAL HIGH (ref 80.0–100.0)
Platelets: 109 K/uL — ABNORMAL LOW (ref 150–400)
RBC: 2.48 MIL/uL — ABNORMAL LOW (ref 3.87–5.11)
RDW: 15.8 % — ABNORMAL HIGH (ref 11.5–15.5)
WBC: 13.5 K/uL — ABNORMAL HIGH (ref 4.0–10.5)
nRBC: 0 % (ref 0.0–0.2)

## 2022-11-18 LAB — BASIC METABOLIC PANEL
Anion gap: 11 (ref 5–15)
BUN: 89 mg/dL — ABNORMAL HIGH (ref 8–23)
CO2: 27 mmol/L (ref 22–32)
Calcium: 8.6 mg/dL — ABNORMAL LOW (ref 8.9–10.3)
Chloride: 98 mmol/L (ref 98–111)
Creatinine, Ser: 2.27 mg/dL — ABNORMAL HIGH (ref 0.44–1.00)
GFR, Estimated: 23 mL/min — ABNORMAL LOW (ref >60)
Glucose, Bld: 96 mg/dL (ref 70–99)
Potassium: 4.7 mmol/L (ref 3.5–5.1)
Sodium: 136 mmol/L (ref 135–145)

## 2022-11-18 LAB — MAGNESIUM: Magnesium: 1.9 mg/dL (ref 1.7–2.4)

## 2022-11-18 MED ORDER — GLYCOPYRROLATE 1 MG PO TABS
1.0000 mg | ORAL_TABLET | ORAL | Status: DC | PRN
  Filled 2022-11-18: qty 1

## 2022-11-18 MED ORDER — ONDANSETRON HCL 4 MG/2ML IJ SOLN
4.0000 mg | Freq: Four times a day (QID) | INTRAMUSCULAR | Status: DC | PRN
  Administered 2022-11-18: 4 mg via INTRAVENOUS
  Filled 2022-11-18: qty 2

## 2022-11-18 MED ORDER — HEPARIN SODIUM (PORCINE) 5000 UNIT/ML IJ SOLN
5000.0000 [IU] | Freq: Three times a day (TID) | INTRAMUSCULAR | Status: DC
  Administered 2022-11-18: 5000 [IU] via SUBCUTANEOUS
  Filled 2022-11-18: qty 1

## 2022-11-18 MED ORDER — ONDANSETRON 4 MG PO TBDP: 4.0000 mg | ORAL_TABLET | Freq: Four times a day (QID) | ORAL | Status: DC | PRN

## 2022-11-18 MED ORDER — HYDROMORPHONE HCL 1 MG/ML IJ SOLN
0.5000 mg | Freq: Four times a day (QID) | INTRAMUSCULAR | Status: DC
  Administered 2022-11-18 – 2022-11-19 (×4): 0.5 mg via INTRAVENOUS
  Filled 2022-11-18 (×4): qty 0.5

## 2022-11-18 MED ORDER — OXYCODONE HCL 5 MG PO TABS
5.0000 mg | ORAL_TABLET | ORAL | Status: DC | PRN
  Administered 2022-11-18: 5 mg
  Filled 2022-11-18: qty 1

## 2022-11-18 MED ORDER — POLYVINYL ALCOHOL 1.4 % OP SOLN: 1.0000 [drp] | Freq: Four times a day (QID) | OPHTHALMIC | Status: DC | PRN

## 2022-11-18 MED ORDER — ACETAMINOPHEN 650 MG RE SUPP: 650.0000 mg | Freq: Four times a day (QID) | RECTAL | Status: DC | PRN

## 2022-11-18 MED ORDER — SULFAMETHOXAZOLE-TRIMETHOPRIM 400-80 MG/5ML IV SOLN
400.0000 mg | Freq: Two times a day (BID) | INTRAVENOUS | Status: DC
  Filled 2022-11-18: qty 25

## 2022-11-18 MED ORDER — DIPHENHYDRAMINE HCL 50 MG/ML IJ SOLN: 25.0000 mg | INTRAMUSCULAR | Status: DC | PRN

## 2022-11-18 MED ORDER — MIDAZOLAM HCL 2 MG/2ML IJ SOLN
2.0000 mg | INTRAMUSCULAR | Status: DC | PRN
  Administered 2022-11-18: 2 mg via INTRAVENOUS
  Filled 2022-11-18: qty 2

## 2022-11-18 MED ORDER — GLYCOPYRROLATE 0.2 MG/ML IJ SOLN: 0.2000 mg | INTRAMUSCULAR | Status: DC | PRN

## 2022-11-18 MED ORDER — FUROSEMIDE 10 MG/ML IJ SOLN
80.0000 mg | Freq: Once | INTRAMUSCULAR | Status: AC
  Administered 2022-11-18: 80 mg via INTRAVENOUS
  Filled 2022-11-18: qty 8

## 2022-11-18 MED ORDER — SODIUM CHLORIDE 0.9 % IV SOLN: 2.0000 g | INTRAVENOUS | Status: DC

## 2022-11-18 MED ORDER — GLYCOPYRROLATE 0.2 MG/ML IJ SOLN
0.2000 mg | INTRAMUSCULAR | Status: DC | PRN
  Administered 2022-11-19: 0.2 mg via INTRAVENOUS
  Filled 2022-11-18: qty 1

## 2022-11-18 MED ORDER — ACETAMINOPHEN 325 MG PO TABS: 650.0000 mg | ORAL_TABLET | Freq: Four times a day (QID) | ORAL | Status: DC | PRN

## 2022-11-18 MED ORDER — HYDROMORPHONE HCL 1 MG/ML IJ SOLN
0.5000 mg | INTRAMUSCULAR | Status: DC | PRN
  Administered 2022-11-18 – 2022-11-19 (×5): 1 mg via INTRAVENOUS
  Filled 2022-11-18 (×6): qty 1

## 2022-11-18 MED ORDER — HALOPERIDOL LACTATE 5 MG/ML IJ SOLN: 2.5000 mg | INTRAMUSCULAR | Status: DC | PRN

## 2022-11-18 MED ORDER — SULFAMETHOXAZOLE-TRIMETHOPRIM 400-80 MG/5ML IV SOLN
300.0000 mg | Freq: Two times a day (BID) | INTRAVENOUS | Status: DC
  Filled 2022-11-18: qty 18.75

## 2022-11-18 MED ORDER — FREE WATER
100.0000 mL | Status: DC
  Administered 2022-11-18: 100 mL

## 2022-11-18 NOTE — Progress Notes (Signed)
**Note De-Identified Fire[place]augh Obfuscation** RT NOTE: CPT held at this time per pt as she is resting. Duoneb nebulizer treatment given. BBS diminished with no rhonchi or wheezing noted. Vitals are stable. RT will resume CPT at next scheduled time.

## 2022-11-18 NOTE — Progress Notes (Signed)
**Note De-identified Lerette Obfuscation**   **Note De-Identified Bornemann Obfuscation** Palliative Medicine Inpatient Follow Up Note  Family meeting held this afternoon.   Goals are transitioned toward comfort care.   We talked about transition to comfort measures in house and what that would entail inclusive of medications to control pain, dyspnea, agitation, nausea, itching, and hiccups.   We discussed stopping all uneccessary measures such as cardiac monitoring, blood draws, needle sticks, and frequent vital signs. Utilized reflective listening throughout our time together.   Family does not feel the need for a chaplain at this time.  Have discussed scheduled pain medication and possibly a gtt if symptoms are uncontrolled.   Plan for transfer to 6N once a bed is available.   SUMMARY OF RECOMMENDATIONS   DNAR/DNI  Comfort care  Have scheduled dilaudid 0.5mg  IV Q6H --> Family is aware that they can refuse this  Unrestricted visitation --> Patient has small grandchildren unit is checking to see if they can visit  Anticipate passing within days  Ongoing PMT support  Additional Time: 67 ______________________________________________________________________________________ [name] Team Team Cell Phone: [phone] Please utilize secure chat with additional questions, if there is no response within 30 minutes please call the above phone number  Palliative Medicine Team providers are available by phone from 7am to 7pm daily and can be reached through the team cell phone.  Should this patient require assistance outside of these hours, please call the patient's attending physician.

## 2022-11-18 NOTE — Progress Notes (Addendum)
**Note De-Identified Scoles Obfuscation** Pharmacy Antibiotic Note  Hailey Hernandez is a 67 y.o. female admitted on 11/21/2022 with pneumonia.  She is on cefepime for serratia PNA but cultures also show stenotrophomonas maltophilia. Pharmacy has been consulted for bactrim dosing -WBC Up TO 13.5, currently afebrile -Scr up to 2.27 from 1.29,, CrCl ~23.5. Renal function continues to decline. Remains on low dose NE. Potassium is trending up to 4.7, may require transition to minocycline or levofloxacin if it continues to trend up with worsening renal function.  Plan: -Continue IV Bactrim dose of 300mg  IV based on adjusted body weight, but adjust dosing interval to q12h given declining renal function -Cefepime 2g every 24 hours -Will follow renal function, cultures and clinical progress  Height: 5\' 2"  (157.5 cm) Weight: 81.6 kg (179 lb 14.3 oz) IBW/kg (Calculated) : 50.1  Temp (24hrs), Avg:98.2 F (36.8 C), Min:97.7 F (36.5 C), Max:98.4 F (36.9 C)  Recent Labs  Lab 11/12/22 0443 11/13/22 0418 11/14/22 0359 11/15/22 0320 11/16/22 0322 11/17/22 0450 11/18/22 0350  WBC 12.4*   < > 11.4* 7.1 9.2 11.5* 13.5*  CREATININE 3.85*   < > 1.80* 1.32* 1.29* 1.65* 2.27*  VANCORANDOM 16  --   --   --   --   --   --    < > = values in this interval not displayed.     Estimated Creatinine Clearance: 23.5 mL/min (A) (by C-G formula based on SCr of 2.27 mg/dL (H)).    Allergies  Allergen Reactions   Doxycycline Shortness Of Breath and Other (See Comments)    Drug interaction with Soriatane   Tramadol Shortness Of Breath    Did not work   Milnacipran     REACTION: dizzy   Apremilast Rash    Rash, able to take this currently    Antimicrobials this admission: 2/10 Azith >> 2/14 2/9 Rocephin >> 2/14 2/10 Vanc >> 2/15 2/14 Cefepime >> 2/16 bactrim>>  Microbiology results: 2/10 blood x2- ngtd 2/9 blood x2- CONS 1/4  2/14 Trach asp - serratia, tenotrophomonas maltophilia.  Thank you for involving pharmacy in this patient's  care.  Reatha Harps, PharmD PGY2 Pharmacy Resident 11/18/2022 7:49 AM

## 2022-11-18 NOTE — Progress Notes (Signed)
**Note De-Identified [place] Obfuscation** NAME:  Hailey Hernandez, MRN:  [id number], DOB:  07-15-1956, LOS: 8 ADMISSION DATE:  11/08/2022, CONSULTATION DATE:  11/10/22 REFERRING MD:  Betsey Holiday, CHIEF COMPLAINT:  ams   History of Present Illness:  67 yo woman with hx of cirrhosis, who presented 2/9 with AMS, elevated troponin, concern for NSTEMI.  N/V x 2 weeks.   3.7 L removed [place] paracentesis PTA. Prior to paracentesis, sleepy, unwell, vague abdominal pain, nausea. Sx worsened after procedure.  Nausea, no po intake. Mild chest pain, dyspnea. Diarrhea and some abdominal pain recently. Was undergoing a w/u with GI (Seen 2/6)  According to family she had been in declining health since Thanksgiving with poor self-reported quality of life.   Admitted by hospitalist to [facility] initially, Cardiology consulted. Later developed hypotension, bradycardia, obtunded.  Intubated.  Arterial  and central line placed. Started on heparin infusion for NSTEMI.  Marked mixed acidosis on initial ABG  Pertinent  Medical History  Cirrhosis (unk cause, med side effect per family) GERD HLD OSA on cpap 5/15 at home  Hx lung ca CAD HTN Depression COPD On home O2 4L, followed by [facility].  Significant Hospital Events: Including procedures, antibiotic start and stop dates in addition to other pertinent events   2/9 Admit with AMS, declined >Intubated for obtundation. Hypercapnic respiratory failure 2/10 increasing vasopressor requirements  2/12 vent weaning. Pressor requirements coming down some.  2/13 Still slow to recover neurologically, otherwise weaning well 2/15 SBT ok but poor mental status, diuresing, fevers. Extubated late PM.   Interim History / Subjective:  Complains of headache, abdominal pain today.  Objective   Blood pressure (!) 111/50, pulse 91, temperature 98 F (36.7 C), temperature source Oral, resp. rate 18, height 5\' 2"  (1.575 m), weight 81.6 kg, SpO2 94 %.        Intake/Output Summary (Last 24 hours) at 11/18/2022 0711 Last  data filed at 11/18/2022 0700 Gross per 24 hour  Intake 4350.75 ml  Output 970 ml  Net 3380.75 ml    Filed Weights   11/15/22 0327 11/16/22 0323 11/17/22 0500  Weight: 81.4 kg 81.6 kg 81.6 kg    Examination:  General: chronically ill appearing woman lying in bed in NAD   HEENT: [place]/AT, eyes anicteric. Dilated veins on temples. Neuro: awake, alert, globally weak, answering questions CV: S1S2, RRR PULM: Wheezing on the R, CTA on the left. Tachypnea, mild expiratory accessory muscle use. GI: soft, no guarding, no tymanitic to percussion Extremities: mild edema, no cyanosis Skin: warm, dry   Tracheal aspirate 2/14 > GNR > mod Serratia & Stenotrophomonas  BUN 89 Cr 2.27 WBC 13.5 H/H 8.0/26.5 Platelets 109   Assessment & Plan:   Acute on chronic respiratory failure with hypoxia, on 4L O2 PTA Pneumonia, likely aspiration in origin- Serratia, stenotrophomonas Post-extubation stridor -bronchodilators -pulmonary hygiene -Bactrim + cefepime to total 7 day course   Acute encephalopathy, metabolic due to liver disease, ICU delirium -pain control- increasing oxycodone today -heat packs PRN -family visitation- daughter at bedside this morning -avoid deliriogenic meds   Hypernatremia, resolved -reduce FWF   Hypotension; concern for adrenal insufficiency -midodrine TID -NE PRN to maintain MAP >65 -stress dose steroids   AKI; seems like baseline renal function worsening as an outpatient the past few months. Worsening renal failure. Baseline CKD 3b. Suspect worsening is related to decompensated liver disease. -strict I/O -failed albumin challenge yesterday -renally dose meds, avoid nephrotoxic meds -con't to hold spironolactone and ACE   FTT -cortrak for TF-- **Note De-Identified Kobs Obfuscation** con't TF today -PT, OT -ongoing Devon discussions   NASH cirrhosis -not on PTA lactulose; can resume PRN if constipation -repeat paracentesis will likely worsen renal failure   Thrombocytopenia; likely due to acute  infection, antibiotics, cirrhosis -ok to start [place] heparin   Hyperglycemia - SSI PRN -goal BG 140-180 -have discussed the possibility of deescalating her BG management to facilitate comfort  Sacral decubitus ulcer Moderate protein energy malnutrition -frequent turns -pain control -TF -avoid pressors as able -wound care  Previous type II MI, demand ischemia Unknown chronicity of cor pulmonale PAH- worry about portopulmonary HTN from cirrhosis -supportive care -renal function not likely to tolerate diuresis at this point  H/o NSCLC status post Chemoradiotherapy -supportive care  Daghter updated at bedside. Prognosis guarded. I am not convinced that this will be a survivable illness given chronic respiratory and liver failure with worsening renal failure. Appreciate Palliative Medicine team's management.  Best Practice (right click and "Reselect all SmartList Selections" daily)  Diet/type: tubefeeds   DVT prophylaxis: prophylactic heparin  GI prophylaxis: PPI Lines: Central line Foley:  Yes, and it is still needed Code Status:  DNR Last date of multidisciplinary goals of care discussion: As above.   Husband updated at bedside 2/17 on plan of care.  Critical Care Time:     This patient is critically ill with multiple organ system failure which requires frequent high complexity decision making, assessment, support, evaluation, and titration of therapies. This was completed through the application of advanced monitoring technologies and extensive interpretation of multiple databases. During this encounter critical care time was devoted to patient care services described in this note for 38 minutes.  Julian Hy, DO 11/18/22 8:08 AM [facility]  For contact information, see Amion. If no response to pager, please call PCCM consult pager. After hours, 7PM- 7AM, please call Elink.

## 2022-11-18 NOTE — Progress Notes (Signed)
**Note De-identified [place] Obfuscation**   **Note De-Identified [place] Obfuscation** Palliative Medicine Inpatient Follow Up Note HPI: 67 y.o. female  with past medical history of  cirrhosis, COPD on 4L home oxygen, non-small cell lung cancer s/p chemotherapy in 2022, OSA on cpap, CAD, HTN, and HLD who presented to the ED on 11/14/2022 with altered mental status. She was found to have elevated troponin with concern for STEMI. Intubated in the ED. Admitted to PCCM with acute on chronic respiratory failure.    Palliative Medicine was consulted for goals of care.   Today's Discussion 11/18/2022  *Please note that this is a verbal dictation therefore any spelling or grammatical errors are due to the "[place]" system interpretation.  Chart reviewed inclusive of vital signs, progress notes, laboratory results, and diagnostic images.   I met with Hailey Hernandez this morning in the presence of her husband, Fritz Pickerel. We discussed patients present health state. Reviewed that [name] has multiple organ systems which are in disarray. He shares that the patient had a very hard time overnight in the setting of her breathing.   Discussed again the idea of comfort care which Fritz Pickerel continues to discuss with his children. He shares his younger daughter is having a terribly difficult time dealing with patients health ailments. I stated that I would be happy to help in any way possible if she needs support.  Reviewed that patient at this time has not been placed on comfort measures. Patients spouse would like some time to speak to his family regarding this transition. He understands no matter what is done for her she is chronically ill and can decline at anytime.   Questions and concerns addressed/Palliative Support Provided.   Objective Assessment: Vital Signs Vitals:   11/18/22 0820 11/18/22 0900  BP:  (!) 96/57  Pulse:  100  Resp:  (!) 24  Temp:    SpO2: 97% 91%    Intake/Output Summary (Last 24 hours) at 11/18/2022 0946 Last data filed at 11/18/2022 0900 Gross per 24 hour   Intake 4034.42 ml  Output 1020 ml  Net 3014.42 ml    Last Weight  Most recent update: 11/17/2022  5:11 AM    Weight  81.6 kg (179 lb 14.3 oz)            Gen:  Elderly caucasian F chronically ill in appearance HEENT: dry mucous membranes CV: Regular rate and rhythm  PULM: On 10LPM HFNC, breathing is labored ABD: soft/nontender  EXT: No edema  Neuro: Alert and oriented x2 - hard of hearing  SUMMARY OF RECOMMENDATIONS   DNAR/DNI  Continue present care at this time  Patients family is aware of the option for comfort care which has been strongly advocated for, they continue to discuss this option  Ongoing PMT support  Billing based on MDM: High ______________________________________________________________________________________ [place] Team Team Cell Phone: [phone] Please utilize secure chat with additional questions, if there is no response within 30 minutes please call the above phone number  Palliative Medicine Team providers are available by phone from 7am to 7pm daily and can be reached through the team cell phone.  Should this patient require assistance outside of these hours, please call the patient's attending physician.

## 2022-11-18 NOTE — Progress Notes (Signed)
**Note De-Identified [place] Obfuscation** Speech Language Pathology Treatment: Dysphagia  Patient Details Name: Hailey Hernandez MRN: [id number] DOB: [birth year] Today's Date: 11/18/2022 Time: 8250-0370 SLP Time Calculation (min) (ACUTE ONLY): 25 min  Assessment / Plan / Recommendation Clinical Impression  Patient seen for diagnostic po trials. Patient alert, pleasant and cooperative. Needs frequent cueing due to combination of confusion and HOH. Weak cough noted at baseline prior to po trials. Oral care complete to increase safety with po intake. SLP provided therapeutic and diagnostic trials of ice chips, thin liquids [place] tsp, and pureed solid, all of which were consumed with swift oral transit of bolus, no s/s of aspiration, and full oral clearance. She did present with a delayed cough response 3-4 minutes after po intake coinciding with complaints of nausea and then 3-4 minutes of dry heaving without emesis. RN provided meds for nausea. In discussion with family, had considered FEES today to evaluate swallowing as this SLP does not feel that patient strong enough to transport to radiology for MBS however in light of nausea following less than 1 tbs of solid food, recommend continuing NPO with potential instrumental testing next date if nausea improved. Patient, family, and RN in agreement. Patient may continue to have 2-3 ice chips following oral care. Giving fluctuating status, recommend SLP sees patient in room for po trials prior to scheduling instrumental testing tomorrow.    HPI HPI: Pt is 67 year old presented to [facility] on  11/10/22 with acute hypoxic respiratory failure due to COPD/PNA. Pt also with NSTEMI and acute metabolic encephalopathy. Intubated 2/9 and extubated 11/15/22. PMH - copd, cirrhosis, lung CA, CAD, HTN, depression      SLP Plan  Continue with current plan of care     Recommendations for follow up therapy are one component of a multi-disciplinary discharge planning process, led by the attending physician.   Recommendations may be updated based on patient status, additional functional criteria and insurance authorization.    Recommendations  Diet recommendations: NPO (except ice chips after oral care) Medication Administration: Bhandari alternative means               Oral Care Recommendations: Oral care QID;Oral care prior to ice chip/H20 Follow Up Recommendations: Other (comment) (TBD) SLP Visit Diagnosis: Dysphagia, unspecified (R13.10) Plan: Continue with current plan of care        [facility] MA, [place]  11/18/2022, 9:37 AM

## 2022-11-18 NOTE — IPAL (Signed)
**Note De-identified [place] Obfuscation**  **Note De-Identified [place] Obfuscation** Interdisciplinary Goals of Care Family Meeting   Date carried out:: 11/18/2022  Location of the meeting: Conference room  Member's involved: Physician, Family Member or next of kin, and Palliative care team member  Durable Power of Attorney or acting medical decision maker: husband    Discussion: We discussed goals of care for [employer] .  Ms. Behnken's family is concerned that her symptoms have been more challenging to control and she has been uncomfortable. They want to transition to comfort-focused care to maximize her QOL and focus on having access to her. We will plan to discontinue meds that do not focus on her comfort.  Code status: Full DNR  Disposition: In-patient comfort care   Time spent for the meeting: 15 min.  Julian Hy 11/18/2022, 1:37 PM

## 2022-11-19 DIAGNOSIS — Z7189 Other specified counseling: Secondary | ICD-10-CM | POA: Diagnosis not present

## 2022-11-19 DIAGNOSIS — I2781 Cor pulmonale (chronic): Secondary | ICD-10-CM

## 2022-11-19 DIAGNOSIS — Z66 Do not resuscitate: Secondary | ICD-10-CM | POA: Diagnosis not present

## 2022-11-19 DIAGNOSIS — Z515 Encounter for palliative care: Secondary | ICD-10-CM | POA: Diagnosis not present

## 2022-11-19 DIAGNOSIS — I214 Non-ST elevation (NSTEMI) myocardial infarction: Secondary | ICD-10-CM | POA: Diagnosis not present

## 2022-11-22 ENCOUNTER — Other Ambulatory Visit: Payer: Self-pay | Admitting: Family Medicine

## 2022-11-22 DIAGNOSIS — E1159 Type 2 diabetes mellitus with other circulatory complications: Secondary | ICD-10-CM

## 2022-11-27 ENCOUNTER — Ambulatory Visit: Payer: Medicare Other | Admitting: Internal Medicine

## 2022-11-30 NOTE — Progress Notes (Signed)
**Note De-Identified [place] Obfuscation** Expiration Note:  Expired at 1612 w/family at bedside.  Prononunced w/Roy Prewett,RN and Winfred Burn.  No audible heart sounds or breath sounds when auscultated x1 full minute.  Dr. Si Raider and [place] notified of expiration.

## 2022-11-30 NOTE — Death Summary Note (Signed)
**Note De-Identified [place] Obfuscation** DEATH SUMMARY   Patient Details  Name: Hailey Hernandez MRN: [id number] DOB: May 08, 1956 YBO:[id number], Hailey Distance, DO Admission/Discharge Information   Admit Date:  2022-11-18  Date of Death: Date of Death: 11-28-22  Time of Death: Time of Death: 12/18/10  Length of Stay: 9   Principle Cause of death: hypoxic respiratory failure  Hospital Diagnoses: Principal Problem:   Hypotension, unspecified hypotension type Active Problems:   NSTEMI (non-ST elevated myocardial infarction) ([place])   Acute renal failure superimposed on chronic kidney disease ([place])   Encephalopathy   Shock ([place])   Acute respiratory failure with hypoxia and hypercarbia ([place])   Cor pulmonale ([place])   Acute on chronic respiratory failure with hypoxia and hypercapnia [facility])   Hospital Course: Expand All Collapse All   PROGRESS NOTE       Clothilde Tippetts Tarter            DPO:[id number] DOB: Dec 15, 1955 DOA: 11-18-22 PCP: [name], DO                Brief Narrative:    67 yo woman with hx of cirrhosis, who presented 2022-11-18 with AMS, elevated troponin, concern for NSTEMI.  N/V x 2 weeks.    3.7 L removed Bertucci paracentesis PTA. Prior to paracentesis, sleepy, unwell, vague abdominal pain, nausea. Sx worsened after procedure.  Nausea, no po intake. Mild chest pain, dyspnea. Diarrhea and some abdominal pain recently. Was undergoing a w/u with GI (Seen 2/6)   According to family she had been in declining health since Thanksgiving with poor self-reported quality of life.    Admitted by hospitalist to [facility] initially, Cardiology consulted. Later developed hypotension, bradycardia, obtunded.  Intubated.  Arterial  and central line placed. Started on heparin infusion for NSTEMI.  Marked mixed acidosis on initial ABG   11/18/22 Admit with AMS, declined >Intubated for obtundation. Hypercapnic respiratory failure 2/10 increasing vasopressor requirements  2/12 vent weaning. Pressor requirements coming down some.  2/13 Still slow  to recover neurologically, otherwise weaning well 2/15 SBT ok but poor mental status, diuresing, fevers. Extubated late PM.  2/18 palliative involved, made full comfort care           Procedures: paracentesis  Consultations: cardiology  The results of significant diagnostics from this hospitalization (including imaging, microbiology, ancillary and laboratory) are listed below for reference.   Significant Diagnostic Studies: DG Abd Portable 1V  Result Date: 11/16/2022 CLINICAL DATA:  Feeding tube placement EXAM: PORTABLE ABDOMEN - 1 VIEW COMPARISON:  Chest x-ray 11/16/2022 FINDINGS: Limited by patient rotation. Esophageal tube tip appears to overlie the mid to distal stomach. IMPRESSION: Esophageal tube tip overlies the mid to distal stomach allowing for marked patient rotation Electronically Signed   By: Donavan Foil M.D.   On: 11/16/2022 15:24   DG CHEST PORT 1 VIEW  Result Date: 11/16/2022 CLINICAL DATA:  Acute respiratory failure with hypoxia EXAM: PORTABLE CHEST 1 VIEW COMPARISON:  11/15/2022 FINDINGS: Interval extubation and removal of enteric tube. Mild patchy left upper lobe and right lower lobe opacities, atelectasis versus pneumonia. No definite pleural effusions. No pneumothorax. The heart is normal in size.  Thoracic aortic atherosclerosis. Right IJ venous catheter terminating at the cavoatrial junction. IMPRESSION: Interval extubation and removal of enteric tube. Mild patchy left upper lobe and right lower lobe opacities, atelectasis versus pneumonia. Electronically Signed   By: Julian Hy M.D.   On: 11/16/2022 07:48   DG Chest Port 1 View  Result Date: 11/15/2022 CLINICAL DATA: **Note De-Identified [place] Obfuscation** Pneumonia EXAM: PORTABLE CHEST 1 VIEW COMPARISON:  11/14/2022 FINDINGS: Prominent cardiac silhouette. Interstitial and alveolar opacity on the right. Vascular congestion. No pneumothorax or pleural effusion identified. Aorta is calcified. Right IJ CVC tip distal SVC. NG tube tip below the  diaphragm and off x-ray. Endotracheal tube tip 2.5 cm below thoracic inlet and 3 cm above carina. IMPRESSION: Interstitial and alveolar opacities consistent with edema and/or pneumonia. Electronically Signed   By: Sammie Bench M.D.   On: 11/15/2022 07:29   DG CHEST PORT 1 VIEW  Result Date: 11/14/2022 CLINICAL DATA:  Provided history: Encounter for central line placement. Hypotension. EXAM: PORTABLE CHEST 1 VIEW COMPARISON:  Prior chest radiographs 11/13/2022 and earlier. Chest CT 11/10/2022. FINDINGS: ET tube present with tip terminating 2.8 cm above the level of the carina. An enteric tube passes below the level of the left hemidiaphragm and terminates outside of the field of view. Right IJ approach central venous catheter with tip at the level of the lower SVC. The cardiomediastinal silhouette is unchanged. Aortic atherosclerosis. Redemonstrated postradiation changes within the left upper lobe. Elevation of the left hemidiaphragm, unchanged. Although aeration of the right lung base has slightly improved as compared to the prior examination of 11/13/2022, there are persistent ill-defined opacities within the right lung base which may reflect atelectasis and/or pneumonia. No definite pleural effusion. No evidence of pneumothorax. IMPRESSION: 1. Support apparatus as described. 2. Although aeration of the right lung base has slightly improved from the prior examination of 11/13/2022, there are persistent ill-defined opacities within the right lung base which may reflect atelectasis and/or pneumonia. 3. Redemonstrated postradiation changes within the left upper lobe. 4. Unchanged elevation of the left hemidiaphragm. 5. Aortic Atherosclerosis (ICD10-I70.0). Electronically Signed   By: Kellie Simmering D.O.   On: 11/14/2022 08:19   DG Chest Port 1 View  Result Date: 11/13/2022 CLINICAL DATA:  Hypoxia EXAM: PORTABLE CHEST 1 VIEW COMPARISON:  Chest x-rays dated 11/10/2022 and 11/11/2022. FINDINGS: Tubes and lines  appear stable in position. New patchy airspace opacities at the RIGHT lung base. More subtle airspace opacities are seen within the RIGHT upper lung. Post radiation changes again noted within the LEFT lung apex, better demonstrated on chest CT of 11/10/2022. No pneumothorax is seen. Heart size and mediastinal contours appear stable. IMPRESSION: 1. New patchy airspace opacities at the RIGHT lung base and within the RIGHT upper lung. I favor this represents a combination of atelectasis or aspiration and small pleural effusion at the RIGHT lung base and asymmetric edema within the RIGHT upper lung. If febrile, multifocal pneumonia can not be excluded. 2. Tubes and lines appear stable in position. Electronically Signed   By: Franki Cabot M.D.   On: 11/13/2022 08:12   ECHOCARDIOGRAM COMPLETE  Result Date: 11/12/2022    ECHOCARDIOGRAM REPORT   Patient Name:   Ardine Iacovelli Upshaw Date of Exam: 11/12/2022 Medical Rec #:  [id number]       Height:       62.0 in Accession #:    [id number]      Weight:       190.7 lb Date of Birth:  03-12-1956        BSA:          1.873 m Patient Age:    39 years        BP:           121/40 mmHg Patient Gender: F **Note De-Identified [place] Obfuscation** HR:           125 bpm. Exam Location:  Inpatient Procedure: 2D Echo, Color Doppler, Cardiac Doppler and Intracardiac            Opacification Agent Indications:    I50.9* Heart failure (unspecified)  History:        Patient has prior history of Echocardiogram examinations, most                 recent 01/25/2017. CAD, COPD; Risk Factors:Hypertension, Diabetes                 and Dyslipidemia.  Sonographer:    Raquel Sarna Senior RDCS Referring Phys: Kipp Brood  Sonographer Comments: Scanned upright on artificial respirator. IMPRESSIONS  1. Left ventricular ejection fraction, by estimation, is 55 to 60%. The left ventricle has normal function. The left ventricle has no regional wall motion abnormalities. Left ventricular diastolic parameters are consistent with Grade II diastolic  dysfunction (pseudonormalization). There is the interventricular septum is flattened in systole, consistent with right ventricular pressure overload.  2. Right ventricular systolic function is mildly reduced. The right ventricular size is moderately enlarged. There is moderately elevated pulmonary artery systolic pressure. The estimated right ventricular systolic pressure is 61.4 mmHg.  3. Left atrial size was moderately dilated.  4. Right atrial size was severely dilated.  5. The mitral valve is grossly normal. Trivial mitral valve regurgitation. No evidence of mitral stenosis. Severe mitral annular calcification.  6. Tricuspid valve regurgitation is mild to moderate.  7. The aortic valve is tricuspid. There is moderate calcification of the aortic valve. There is mild thickening of the aortic valve. Aortic valve regurgitation is not visualized. Aortic valve sclerosis is present, with no evidence of aortic valve stenosis.  8. The inferior vena cava is dilated in size with <50% respiratory variability, suggesting right atrial pressure of 15 mmHg. Conclusion(s)/Recommendation(s): Elevated right sided pressures with dilated RV/RA. FINDINGS  Left Ventricle: Left ventricular ejection fraction, by estimation, is 55 to 60%. The left ventricle has normal function. The left ventricle has no regional wall motion abnormalities. Definity contrast agent was given IV to delineate the left ventricular  endocardial borders. The left ventricular internal cavity size was normal in size. There is borderline left ventricular hypertrophy. The interventricular septum is flattened in systole, consistent with right ventricular pressure overload. Left ventricular diastolic parameters are consistent with Grade II diastolic dysfunction (pseudonormalization). Right Ventricle: The right ventricular size is moderately enlarged. No increase in right ventricular wall thickness. Right ventricular systolic function is mildly reduced. There is  moderately elevated pulmonary artery systolic pressure. The tricuspid regurgitant velocity is 3.02 m/s, and with an assumed right atrial pressure of 15 mmHg, the estimated right ventricular systolic pressure is 43.1 mmHg. Left Atrium: Left atrial size was moderately dilated. Right Atrium: Right atrial size was severely dilated. Pericardium: There is no evidence of pericardial effusion. Mitral Valve: Calcified subvalvular apparatus. The mitral valve is grossly normal. There is mild thickening of the mitral valve leaflet(s). Severe mitral annular calcification. Trivial mitral valve regurgitation. No evidence of mitral valve stenosis. Tricuspid Valve: The tricuspid valve is normal in structure. Tricuspid valve regurgitation is mild to moderate. Aortic Valve: The aortic valve is tricuspid. There is moderate calcification of the aortic valve. There is mild thickening of the aortic valve. Aortic valve regurgitation is not visualized. Aortic valve sclerosis is present, with no evidence of aortic valve stenosis. Pulmonic Valve: The pulmonic valve was grossly normal. Pulmonic valve regurgitation is trivial. **Note De-Identified [place] Obfuscation** No evidence of pulmonic stenosis. Aorta: The aortic root, ascending aorta and aortic arch are all structurally normal, with no evidence of dilitation or obstruction. Venous: The inferior vena cava is dilated in size with less than 50% respiratory variability, suggesting right atrial pressure of 15 mmHg. IAS/Shunts: The atrial septum is grossly normal. Additional Comments: There is a small pleural effusion in both left and right lateral regions.  LEFT VENTRICLE PLAX 2D LVIDd:         4.50 cm   Diastology LVIDs:         3.40 cm   LV e' medial:    7.29 cm/s LV PW:         0.90 cm   LV E/e' medial:  15.6 LV IVS:        1.20 cm   LV e' lateral:   9.32 cm/s LVOT diam:     2.10 cm   LV E/e' lateral: 12.2 LV SV:         63 LV SV Index:   34 LVOT Area:     3.46 cm  RIGHT VENTRICLE RV S prime:     9.14 cm/s TAPSE (M-mode): 1.5 cm  LEFT ATRIUM             Index        RIGHT ATRIUM           Index LA diam:        4.60 cm 2.46 cm/m   RA Area:     29.10 cm LA Vol (A2C):   69.0 ml 36.83 ml/m  RA Volume:   116.00 ml 61.92 ml/m LA Vol (A4C):   60.9 ml 32.51 ml/m LA Biplane Vol: 64.7 ml 34.54 ml/m  AORTIC VALVE LVOT Vmax:   143.00 cm/s LVOT Vmean:  96.600 cm/s LVOT VTI:    0.183 m  AORTA Ao Root diam: 2.90 cm Ao Asc diam:  3.10 cm MITRAL VALVE                TRICUSPID VALVE MV Area (PHT): 4.36 cm     TR Peak grad:   36.5 mmHg MV Decel Time: 174 msec     TR Vmax:        302.00 cm/s MV E velocity: 114.00 cm/s MV A velocity: 141.00 cm/s  SHUNTS MV E/A ratio:  0.81         Systemic VTI:  0.18 m                             Systemic Diam: 2.10 cm Buford Dresser MD Electronically signed by Buford Dresser MD Signature Date/Time: 11/12/2022/2:04:19 PM    Final    CT CHEST ABDOMEN PELVIS WO CONTRAST  Result Date: 11/10/2022 CLINICAL DATA:  67 year old female with history of non-small cell lung cancer. Evaluate for metastatic disease. Chest pain and abdominal pain. Renal failure. * Tracking Code: BO * EXAM: CT CHEST, ABDOMEN AND PELVIS WITHOUT CONTRAST TECHNIQUE: Multidetector CT imaging of the chest, abdomen and pelvis was performed following the standard protocol without IV contrast. RADIATION DOSE REDUCTION: This exam was performed according to the departmental dose-optimization program which includes automated exposure control, adjustment of the mA and/or kV according to patient size and/or use of iterative reconstruction technique. COMPARISON:  CT of the abdomen and pelvis 10/23/2022. Chest CT 06/05/2022. FINDINGS: Comment: Today's study is limited for detection and characterization of visceral and/or vascular lesions by lack of IV contrast. CT CHEST **Note De-Identified [place] Obfuscation** FINDINGS Cardiovascular: Heart size is mildly enlarged. There is no significant pericardial fluid, thickening or pericardial calcification. There is aortic atherosclerosis, as well as  atherosclerosis of the great vessels of the mediastinum and the coronary arteries, including calcified atherosclerotic plaque in the left main, left anterior descending, left circumflex and right coronary arteries. Calcifications of the aortic valve and mitral annulus. Right internal jugular central venous catheter with tip terminating in the distal superior vena cava. Mediastinum/Nodes: No lymphadenopathy noted in the mediastinal or hilar nodal stations confidently identified on today's noncontrast CT examination. Please note that accurate exclusion of hilar adenopathy is limited on noncontrast CT scans. Nasogastric tube in the esophagus. Esophagus is otherwise unremarkable in appearance. Endotracheal tube terminating 3.8 cm above the carina. No axillary lymphadenopathy. Lungs/Pleura: Patchy areas of mass-like architectural distortion are again noted in the left upper lung, compatible with evolving postradiation changes. No definite new suspicious appearing pulmonary nodules or masses are noted in other portions of the lungs. Trace bilateral pleural effusions. No acute consolidative airspace disease. Musculoskeletal: There are no aggressive appearing lytic or blastic lesions noted in the visualized portions of the skeleton. CT ABDOMEN PELVIS FINDINGS Hepatobiliary: Liver has a shrunken appearance and nodular contour, suggesting underlying cirrhosis. No discrete cystic or solid hepatic lesions are confidently identified on today's noncontrast CT examination. A few scattered calcified granulomas are also noted in the liver. Status post cholecystectomy. Pancreas: No definite pancreatic mass or peripancreatic fluid collections identified on today's noncontrast CT examination. Spleen: Unremarkable. Adrenals/Urinary Tract: Severe atrophy of the left native kidney again noted. Unenhanced appearance of the right kidney and bilateral adrenal glands is unremarkable. No hydroureteronephrosis. Foley balloon catheter within the  lumen of the urinary bladder, which is otherwise nearly completely decompressed around the catheter. Stomach/Bowel: Nasogastric tube extends into the antral pre-pyloric region of the stomach. Unenhanced appearance of the stomach is otherwise unremarkable. No pathologic dilatation of small bowel or colon. Multiple colonic diverticuli are noted, without definite focal surrounding inflammatory changes to clearly indicate the presence of an acute diverticulitis (assessment is limited by presence of ascites). Appendix is not confidently identified may be surgically absent. Vascular/Lymphatic: Aortic atherosclerosis. No definite lymphadenopathy confidently identified in the abdomen or pelvis on today's noncontrast CT examination. Reproductive: Status post hysterectomy. Ovaries are not confidently identified may be surgically absent or atrophic. Other: Moderate volume of ascites. No pneumoperitoneum. Mild diffuse body wall edema. Musculoskeletal: There are no aggressive appearing lytic or blastic lesions noted in the visualized portions of the skeleton. IMPRESSION: 1. Evolving postradiation changes in the upper left lung related to prior radiation therapy. No definitive findings to suggest metastatic disease in the chest, abdomen or pelvis confidently identified on today's limited noncontrast CT examination. 2. Hepatic cirrhosis with moderate volume of ascites. 3. Colonic diverticulosis. 4. Severe atrophy of the left kidney. 5. Aortic atherosclerosis, in addition to left main and three-vessel coronary artery disease. Please note that although the presence of coronary artery calcium documents the presence of coronary artery disease, the severity of this disease and any potential stenosis cannot be assessed on this non-gated CT examination. Assessment for potential risk factor modification, dietary therapy or pharmacologic therapy may be warranted, if clinically indicated. 6. There are calcifications of the aortic valve and  mitral annulus. Echocardiographic correlation for evaluation of potential valvular dysfunction may be warranted if clinically indicated. 7. Additional incidental findings, as above. Electronically Signed   By: Vinnie Langton M.D.   On: 11/10/2022 07:17   CT HEAD WO CONTRAST (5MM) **Note De-Identified [place] Obfuscation** Result Date: 11/10/2022 CLINICAL DATA:  67 year old female with headache and neurologic deficit. Lung cancer, cirrhosis. EXAM: CT HEAD WITHOUT CONTRAST TECHNIQUE: Contiguous axial images were obtained from the base of the skull through the vertex without intravenous contrast. RADIATION DOSE REDUCTION: This exam was performed according to the departmental dose-optimization program which includes automated exposure control, adjustment of the mA and/or kV according to patient size and/or use of iterative reconstruction technique. COMPARISON:  Brain MRI 02/07/2021.  Head CT 06/08/2018. FINDINGS: Brain: Cerebral volume remains normal for age No midline shift, ventriculomegaly, mass effect, evidence of mass lesion, intracranial hemorrhage or evidence of cortically based acute infarction. Patchy and confluent bilateral cerebral white matter hypodensity appears similar to 2022 MRI signal changes. And chronic right thalamic lacunar infarct is redemonstrated. Vascular: Calcified atherosclerosis at the skull base. No suspicious intracranial vascular hyperdensity. Skull: No acute osseous abnormality identified. Sinuses/Orbits: Well aerated considering intubation. Chronic right mastoidectomy is stable. Other: Partially visible intubation. Associated fluid in the pharynx. Negative visible orbits and scalp. IMPRESSION: 1. No acute intracranial abnormality. 2. Stable non contrast CT appearance of chronic small vessel disease since a 2022 MRI. 3. Intubated. Electronically Signed   By: Genevie Ann M.D.   On: 11/10/2022 06:40   DG Chest Portable 1 View  Result Date: 11/10/2022 CLINICAL DATA:  Intubation film. EXAM: PORTABLE CHEST 1 VIEW COMPARISON:   Portable chest yesterday at 10:25 p.m. FINDINGS: At 1:44 a.m. ETT interval insertion with tip 2.8 cm from the carina. NGT interval insertion with the intragastric course not filmed. Interval right IJ line placement with tip in the distal SVC. Stable cardiomegaly. There is mild central vascular prominence. No interstitial edema was seen. There are chronic heterogeneous opacities in the left lung apex and chronic left-sided volume loss and elevated left diaphragm. Overlying left apical pleural capping is also unchanged. The remaining lungs clear with no new abnormality. There is a stable mediastinal configuration with heavy calcification in the aortic arch. Osteopenia and thoracic spondylosis. IMPRESSION: 1. ETT tip 2.8 cm from the carina. NGT interval insertion with the intragastric course not filmed. 2. Right IJ line placement with tip in the distal SVC. 3. Stable cardiomegaly and mild central vascular prominence. 4. Chronic left-sided volume loss and heterogeneous opacities in the left lung apex. No new lung opacity. Electronically Signed   By: Telford Nab M.D.   On: 11/10/2022 02:01   DG Chest Port 1 View  Result Date: 11/03/2022 CLINICAL DATA:  Nausea EXAM: PORTABLE CHEST 1 VIEW COMPARISON:  Chest x-ray 10/23/2022.  CT of the chest 06/05/2022 FINDINGS: Heart is enlarged, unchanged. There is stable elevation of the left hemidiaphragm. There are patchy opacities in the left lung apex with some left apical pleural thickening which appears stable. No new focal lung infiltrate, pleural effusion or pneumothorax. No acute fracture. IMPRESSION: 1. No acute cardiopulmonary process. 2. Stable cardiomegaly. 3. Stable chronic opacities in the left lung apex. Electronically Signed   By: Ronney Asters M.D.   On: 11/05/2022 22:40   IR Paracentesis  Result Date: 11/08/2022 INDICATION: History of end-stage COPD on chronic O2 therapy, lung cancer, decompensated hepatic cirrhosis secondary to NASH. Patient was referred to  IR for diagnostic and therapeutic paracentesis with a 5 L max. EXAM: ULTRASOUND GUIDED DIAGNOSTIC AND THERAPEUTIC RIGHT LOWER QUADRANT PARACENTESIS MEDICATIONS: 10 mL 1 % lidocaine COMPLICATIONS: None immediate. PROCEDURE: Informed written consent was obtained from the patient after a discussion of the risks, benefits and alternatives to treatment. A timeout was performed prior to **Note De-Identified [place] Obfuscation** the initiation of the procedure. Initial ultrasound scanning demonstrates a large amount of ascites within the right lower abdominal quadrant. The right lower abdomen was prepped and draped in the usual sterile fashion. 1% lidocaine was used for local anesthesia. Following this, a 19 gauge, 7-cm, Yueh catheter was introduced. An ultrasound image was saved for documentation purposes. The paracentesis was performed. The catheter was removed and a dressing was applied. The patient tolerated the procedure well without immediate post procedural complication. FINDINGS: A total of approximately 3.6 L of clear, pale yellow fluid was removed. Samples were sent to the laboratory as requested by the clinical team. IMPRESSION: Successful ultrasound-guided paracentesis yielding 3.6 liters of peritoneal fluid. PLAN: If the patient eventually requires >/=2 paracenteses in a 30 day period, candidacy for formal evaluation by the [facility] will be assessed. Read by: Narda Rutherford, AGNP-BC Electronically Signed   By: Markus Daft M.D.   On: 11/08/2022 15:56   CT Abdomen Pelvis W Contrast  Result Date: 10/23/2022 CLINICAL DATA:  Non-small cell lung cancer. Right upper quadrant abdominal pain. * Tracking Code: BO * EXAM: CT ABDOMEN AND PELVIS WITH CONTRAST TECHNIQUE: Multidetector CT imaging of the abdomen and pelvis was performed using the standard protocol following bolus administration of intravenous contrast. RADIATION DOSE REDUCTION: This exam was performed according to the departmental  dose-optimization program which includes automated exposure control, adjustment of the mA and/or kV according to patient size and/or use of iterative reconstruction technique. CONTRAST:  2mL OMNIPAQUE IOHEXOL 300 MG/ML  SOLN COMPARISON:  PET-CT 01/09/2021 FINDINGS: Lower chest: Moderate cardiomegaly. Coronary atherosclerosis. Mitral valve calcification. Descending thoracic aortic atherosclerosis. 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 Old granulomatous disease with punctate calcified granuloma in the posterior basal segment left lower lobe. Hepatobiliary: Lobulated contour of the liver and overall hepatic morphology compatible with cirrhosis. Patent portal vein. No discrete hepatic mass identified. Gallbladder surgically absent. Pancreas: Unremarkable Spleen: Stable splenomegaly.  No significant focal splenic lesion. Adrenals/Urinary Tract: Severe left renal atrophy. Retained fetal lobulation of the right kidney. Adrenal glands unremarkable. Urinary bladder extends 3 mm below the pubococcygeal line compatible with a small cystocele. Stomach/Bowel: Bowel wall thickening and potentially thickened folds in the proximal jejunum with associated air-fluid level. No overtly dilated small bowel, the orally administered contrast extends through to the descending colon. Sigmoid colon diverticulosis is present, I am skeptical of acute diverticulitis although the presence of background ascites and mesenteric edema may reduce sensitivity for subtle is diverticulitis. There is some potential mild wall thickening in the transverse colon and ascending colon, with folds along the ascending colon somewhat serrated in appearance almost resembling small bowel on image 36 series 2. No definite inflammatory findings in the terminal ileum. Low position of the anorectal junction on image 77 series 6 compatible with pelvic floor laxity. Vascular/Lymphatic: Atherosclerosis is present, including aortoiliac atherosclerotic disease.  Substantial atheromatous plaque proximally in the celiac trunk and superior mesenteric artery, with multifocal atherosclerotic calcifications further distally in the SMA, potentially contributing to substantial stenosis but without overt occlusion identified. Substantial collateral vessels running along the omentum. Appearance compatible with portal venous hypertension. Reproductive: Uterus absent.  Adnexa unremarkable. Other: Moderate ascites. Mesenteric and subcutaneous edema especially along the anterior abdominal wall. Musculoskeletal: Facet and interbody fusion at L5-S1. Moderate degenerative arthropathy of both hips. IMPRESSION: 1. No findings of metastatic disease to the abdomen or pelvis. 2. Cirrhosis with splenomegaly, moderate ascites, and upper abdominal collateral vessels compatible with portal venous hypertension. 3. Bowel **Note De-Identified Detienne Obfuscation** wall thickening in the proximal jejunum with some air-fluid level, query enteritis. There is also some potential mild wall thickening in the ascending colon and transverse colon which could be from portal colopathy or low-grade colitis. There is substantial atheromatous narrowing in the SMA but no specific indicators of ischemic enterocolitis. Infectious enterocolitis would be favored. 4. Moderate cardiomegaly. Coronary atherosclerosis. Mitral valve calcification. 5. Severe left renal atrophy. Retained fetal lobulation of the right kidney. 6. Pelvic floor laxity with low position of the anorectal junction and small cystocele. 7. Sigmoid colon diverticulosis. 8. Substantial atheromatous plaque proximally in the celiac trunk and superior mesenteric artery, with multifocal atherosclerotic calcifications further distally in the SMA, potentially contributing to substantial stenosis but without overt occlusion identified. 9. 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 Aortic Atherosclerosis (ICD10-I70.0). Electronically Signed   By: Van Clines M.D.   On: 10/23/2022 18:56   DG  Chest 2 View  Result Date: 10/23/2022 CLINICAL DATA:  Cough after flu.  History of cancer. EXAM: CHEST - 2 VIEW COMPARISON:  CT of the chest 06/05/2022.  Chest x-ray 12/14/2021. FINDINGS: There are increased central interstitial markings bilaterally. There stable chronic opacities in the left lung apex. No new focal lung infiltrate, pleural effusion or pneumothorax. The cardiomediastinal silhouette is within normal limits. No acute fractures are seen. IMPRESSION: 1. Increased central interstitial markings bilaterally, which can be seen with atypical infection or pulmonary edema. 2. Stable chronic opacities in the left lung apex. Electronically Signed   By: Ronney Asters M.D.   On: 10/23/2022 16:35    Microbiology: Recent Results (from the past 240 hour(s))  Culture, blood (Routine x 2)     Status: Abnormal   Collection Time: 11/08/2022  9:31 PM   Specimen: BLOOD  Result Value Ref Range Status   Specimen Description   Final    BLOOD SITE NOT SPECIFIED Performed at [facility] [address] [place], [place] [address]    Special Requests   Final    BOTTLES DRAWN AEROBIC AND ANAEROBIC Blood Culture results may not be optimal due to an inadequate volume of blood received in culture bottles Performed at [facility] [address] [place], [place] [address]    Culture  Setup Time   Final    ANAEROBIC BOTTLE ONLY GRAM POSITIVE COCCI IN CLUSTERS CRITICAL RESULT CALLED TO, READ BACK BY AND VERIFIED WITH:  C/ PHARMD C. PIERCE 11/10/22 1956 A. LAFRANCE    Culture (A)  Final    STAPHYLOCOCCUS HAEMOLYTICUS STAPHYLOCOCCUS HOMINIS STAPHYLOCOCCUS EPIDERMIDIS THE SIGNIFICANCE OF ISOLATING THIS ORGANISM FROM A SINGLE SET OF BLOOD CULTURES WHEN MULTIPLE SETS ARE DRAWN IS UNCERTAIN. PLEASE NOTIFY THE MICROBIOLOGY DEPARTMENT WITHIN ONE WEEK IF SPECIATION AND SENSITIVITIES ARE REQUIRED. Performed at [facility], [place] [address], [place], [place] [address]     Report Status 11/12/2022 FINAL  Final  Blood Culture ID Panel (Reflexed)     Status: Abnormal   Collection Time: 11/23/2022  9:31 PM  Result Value Ref Range Status   Enterococcus faecalis NOT DETECTED NOT DETECTED Final   Enterococcus Faecium NOT DETECTED NOT DETECTED Final   Listeria monocytogenes NOT DETECTED NOT DETECTED Final   Staphylococcus species DETECTED (A) NOT DETECTED Final    Comment: CRITICAL RESULT CALLED TO, READ BACK BY AND VERIFIED WITH:  C/ PHARMD C. PIERCE 11/10/22 1956 A. LAFRANCE    Staphylococcus aureus (BCID) NOT DETECTED NOT DETECTED Final   Staphylococcus epidermidis DETECTED (A) NOT DETECTED Final **Note De-Identified Maday Obfuscation** Comment: Methicillin (oxacillin) resistant coagulase negative staphylococcus. Possible blood culture contaminant (unless isolated from more than one blood culture draw or clinical case suggests pathogenicity). No antibiotic treatment is indicated for blood  culture contaminants. CRITICAL RESULT CALLED TO, READ BACK BY AND VERIFIED WITH:  C/ PHARMD C. PIERCE 11/10/22 1956 A. LAFRANCE    Staphylococcus lugdunensis NOT DETECTED NOT DETECTED Final   Streptococcus species NOT DETECTED NOT DETECTED Final   Streptococcus agalactiae NOT DETECTED NOT DETECTED Final   Streptococcus pneumoniae NOT DETECTED NOT DETECTED Final   Streptococcus pyogenes NOT DETECTED NOT DETECTED Final   A.calcoaceticus-baumannii NOT DETECTED NOT DETECTED Final   Bacteroides fragilis NOT DETECTED NOT DETECTED Final   Enterobacterales NOT DETECTED NOT DETECTED Final   Enterobacter cloacae complex NOT DETECTED NOT DETECTED Final   Escherichia coli NOT DETECTED NOT DETECTED Final   Klebsiella aerogenes NOT DETECTED NOT DETECTED Final   Klebsiella oxytoca NOT DETECTED NOT DETECTED Final   Klebsiella pneumoniae NOT DETECTED NOT DETECTED Final   Proteus species NOT DETECTED NOT DETECTED Final   Salmonella species NOT DETECTED NOT DETECTED Final   Serratia marcescens NOT DETECTED NOT DETECTED Final    Haemophilus influenzae NOT DETECTED NOT DETECTED Final   Neisseria meningitidis NOT DETECTED NOT DETECTED Final   Pseudomonas aeruginosa NOT DETECTED NOT DETECTED Final   Stenotrophomonas maltophilia NOT DETECTED NOT DETECTED Final   Candida albicans NOT DETECTED NOT DETECTED Final   Candida auris NOT DETECTED NOT DETECTED Final   Candida glabrata NOT DETECTED NOT DETECTED Final   Candida krusei NOT DETECTED NOT DETECTED Final   Candida parapsilosis NOT DETECTED NOT DETECTED Final   Candida tropicalis NOT DETECTED NOT DETECTED Final   Cryptococcus neoformans/gattii NOT DETECTED NOT DETECTED Final   Methicillin resistance mecA/C DETECTED (A) NOT DETECTED Final    Comment: CRITICAL RESULT CALLED TO, READ BACK BY AND VERIFIED WITH:  C/ PHARMD C. PIERCE 11/10/22 1956 A. LAFRANCE Performed at [facility], [place] [address], [place], [place] [address]   Culture, blood (Routine x 2)     Status: None   Collection Time: 11/11/2022  9:40 PM   Specimen: BLOOD  Result Value Ref Range Status   Specimen Description   Final    BLOOD RIGHT ANTECUBITAL Performed at [facility] [address], [place], [place] [address]    Special Requests   Final    BOTTLES DRAWN AEROBIC AND ANAEROBIC Blood Culture adequate volume Performed at [facility] [address], [place], [place] [address]    Culture   Final    NO GROWTH 5 DAYS Performed at [facility], [place] [address], [place],  [address]    Report Status 11/15/2022 FINAL  Final  Resp panel by RT-PCR (RSV, Flu A&B, Covid) Anterior Nasal Swab     Status: None   Collection Time: 11/01/2022 10:33 PM   Specimen: Anterior Nasal Swab  Result Value Ref Range Status   SARS Coronavirus 2 by RT PCR NEGATIVE NEGATIVE Final    Comment: (NOTE) SARS-CoV-2 target nucleic acids are NOT DETECTED.  The SARS-CoV-2 RNA is generally detectable in upper respiratory specimens during the acute phase of infection. The  lowest concentration of SARS-CoV-2 viral copies this assay can detect is 138 copies/mL. A negative result does not preclude SARS-Cov-2 infection and should not be used as the sole basis for treatment or other patient management decisions. A negative result may occur with  improper specimen collection/handling, submission of specimen other **Note De-Identified Dexheimer Obfuscation** than nasopharyngeal swab, presence of viral mutation(s) within the areas targeted by this assay, and inadequate number of viral copies(<138 copies/mL). A negative result must be combined with clinical observations, patient history, and epidemiological information. The expected result is Negative.  Fact Sheet for Patients:  EntrepreneurPulse.com.au  Fact Sheet for Healthcare Providers:  IncredibleEmployment.be  This test is no t yet approved or cleared by the [place] FDA and  has been authorized for detection and/or diagnosis of SARS-CoV-2 by FDA under an Emergency Use Authorization (EUA). This EUA will remain  in effect (meaning this test can be used) for the duration of the COVID-19 declaration under Section 564(b)(1) of the Act, 21 U.S.C.section 360bbb-3(b)(1), unless the authorization is terminated  or revoked sooner.       Influenza A by PCR NEGATIVE NEGATIVE Final   Influenza B by PCR NEGATIVE NEGATIVE Final    Comment: (NOTE) The Xpert Xpress SARS-CoV-2/FLU/RSV plus assay is intended as an aid in the diagnosis of influenza from Nasopharyngeal swab specimens and should not be used as a sole basis for treatment. Nasal washings and aspirates are unacceptable for Xpert Xpress SARS-CoV-2/FLU/RSV testing.  Fact Sheet for Patients: EntrepreneurPulse.com.au  Fact Sheet for Healthcare Providers: IncredibleEmployment.be  This test is not yet approved or cleared by the [place] FDA and has been authorized for detection and/or diagnosis of SARS-CoV-2 by FDA under  an Emergency Use Authorization (EUA). This EUA will remain in effect (meaning this test can be used) for the duration of the COVID-19 declaration under Section 564(b)(1) of the Act, 21 U.S.C. section 360bbb-3(b)(1), unless the authorization is terminated or revoked.     Resp Syncytial Virus by PCR NEGATIVE NEGATIVE Final    Comment: (NOTE) Fact Sheet for Patients: EntrepreneurPulse.com.au  Fact Sheet for Healthcare Providers: IncredibleEmployment.be  This test is not yet approved or cleared by the [place] FDA and has been authorized for detection and/or diagnosis of SARS-CoV-2 by FDA under an Emergency Use Authorization (EUA). This EUA will remain in effect (meaning this test can be used) for the duration of the COVID-19 declaration under Section 564(b)(1) of the Act, 21 U.S.C. section 360bbb-3(b)(1), unless the authorization is terminated or revoked.  Performed at [facility], [place] [address], [place], [place] [address]   MRSA Next Gen by PCR, Nasal     Status: None   Collection Time: 11/10/22  4:30 AM  Result Value Ref Range Status   MRSA by PCR Next Gen NOT DETECTED NOT DETECTED Final    Comment: (NOTE) The GeneXpert MRSA Assay (FDA approved for NASAL specimens only), is one component of a comprehensive MRSA colonization surveillance program. It is not intended to diagnose MRSA infection nor to guide or monitor treatment for MRSA infections. Test performance is not FDA approved in patients less than 49 years old. Performed at [facility], [place] [address], [place], [place] [address]   Culture, blood (Routine X 2) w Reflex to ID Panel     Status: None   Collection Time: 11/10/22  9:05 PM   Specimen: BLOOD LEFT ARM  Result Value Ref Range Status   Specimen Description BLOOD LEFT ARM  Final   Special Requests   Final    BOTTLES DRAWN AEROBIC AND ANAEROBIC Blood Culture results may not be optimal due to an  excessive volume of blood received in culture bottles   Culture   Final    NO GROWTH 5 DAYS Performed at [facility], [place] **Note De-Identified Lackie Obfuscation** [address], [place], [place] [address]    Report Status 11/15/2022 FINAL  Final  Culture, blood (Routine X 2) w Reflex to ID Panel     Status: None   Collection Time: 11/10/22  9:05 PM   Specimen: BLOOD LEFT ARM  Result Value Ref Range Status   Specimen Description BLOOD LEFT ARM  Final   Special Requests   Final    BOTTLES DRAWN AEROBIC AND ANAEROBIC Blood Culture adequate volume   Culture   Final    NO GROWTH 5 DAYS Performed at [facility], [place] [address], [place], [place] [address]    Report Status 11/15/2022 FINAL  Final  Culture, Respiratory w Gram Stain     Status: None   Collection Time: 11/14/22 10:33 AM   Specimen: Tracheal Aspirate; Respiratory  Result Value Ref Range Status   Specimen Description TRACHEAL ASPIRATE  Final   Special Requests NONE  Final   Gram Stain   Final    ABUNDANT WBC PRESENT, PREDOMINANTLY PMN MODERATE GRAM NEGATIVE RODS    Culture   Final    ABUNDANT SERRATIA MARCESCENS ABUNDANT STENOTROPHOMONAS MALTOPHILIA NO STAPHYLOCOCCUS AUREUS ISOLATED Performed at [facility], [place] [address] [place], [place] [address]    Report Status 11/17/2022 FINAL  Final   Organism ID, Bacteria SERRATIA MARCESCENS  Final   Organism ID, Bacteria STENOTROPHOMONAS MALTOPHILIA  Final      Susceptibility   Stenotrophomonas maltophilia - MIC*    LEVOFLOXACIN 0.5 SENSITIVE Sensitive     TRIMETH/SULFA <=20 SENSITIVE Sensitive     * ABUNDANT STENOTROPHOMONAS MALTOPHILIA   Serratia marcescens - MIC*    CEFEPIME <=0.12 SENSITIVE Sensitive     CEFTAZIDIME <=1 SENSITIVE Sensitive     CEFTRIAXONE <=0.25 SENSITIVE Sensitive     CIPROFLOXACIN <=0.25 SENSITIVE Sensitive     GENTAMICIN <=1 SENSITIVE Sensitive     TRIMETH/SULFA <=20 SENSITIVE Sensitive     * ABUNDANT SERRATIA MARCESCENS  MRSA Next Gen by PCR, Nasal     Status: None    Collection Time: 11/15/22  7:14 AM   Specimen: Nasal Mucosa; Nasal Swab  Result Value Ref Range Status   MRSA by PCR Next Gen NOT DETECTED NOT DETECTED Final    Comment: (NOTE) The GeneXpert MRSA Assay (FDA approved for NASAL specimens only), is one component of a comprehensive MRSA colonization surveillance program. It is not intended to diagnose MRSA infection nor to guide or monitor treatment for MRSA infections. Test performance is not FDA approved in patients less than 74 years old. Performed at [facility], [place] [address] [place], [place] [address]     Time spent: 35 minutes  Signed: Desma Maxim, MD 12/09/22

## 2022-11-30 NOTE — Progress Notes (Signed)
**Note De-Identified [place] Obfuscation** PROGRESS NOTE    Hailey Hernandez  HGD:[id number] DOB: 05-13-1956 DOA: 11/01/2022 PCP: [name], DO    Brief Narrative:   67 yo woman with hx of cirrhosis, who presented 2/9 with AMS, elevated troponin, concern for NSTEMI.  N/V x 2 weeks.    3.7 L removed Radel paracentesis PTA. Prior to paracentesis, sleepy, unwell, vague abdominal pain, nausea. Sx worsened after procedure.  Nausea, no po intake. Mild chest pain, dyspnea. Diarrhea and some abdominal pain recently. Was undergoing a w/u with GI (Seen 2/6)   According to family she had been in declining health since Thanksgiving with poor self-reported quality of life.    Admitted by hospitalist to [name] initially, Cardiology consulted. Later developed hypotension, bradycardia, obtunded.  Intubated.  Arterial  and central line placed. Started on heparin infusion for NSTEMI.  Marked mixed acidosis on initial ABG  2/9 Admit with AMS, declined >Intubated for obtundation. Hypercapnic respiratory failure 2/10 increasing vasopressor requirements  2/12 vent weaning. Pressor requirements coming down some.  2/13 Still slow to recover neurologically, otherwise weaning well 2/15 SBT ok but poor mental status, diuresing, fevers. Extubated late PM.  2/18 palliative involved, made full comfort care   Assessment & Plan:   Principal Problem:   Hypotension, unspecified hypotension type Active Problems:   NSTEMI (non-ST elevated myocardial infarction) ([place])   Acute renal failure superimposed on chronic kidney disease ([place])   Encephalopathy   Shock ([place])   Acute respiratory failure with hypoxia and hypercarbia (HCC)   Cor pulmonale (HCC)   Acute on chronic respiratory failure with hypoxia and hypercapnia ([place])   # End-of-life care Palliative following. Full comfort care as of 2/18. Anticipate in-hospital death, may be hospice home following. Appears comfortable and husband confirms this. - continue comfort care order set  Hospital  problems include: - acute on chronic respiratory failure with hypoxia - acute encephalopathy, metabolic, from liver disease and ICU delirium - hypernatremia - Hypotension - Acute kidney injury - NASH cirrhosis - thrombocytopenia - hyperglycemia - sacral decubitus ulcer - nstemia   Code Status: DNR Family Communication: husband updated @ bedside  Level of care: Med-Surg Status is: Inpatient Remains inpatient appropriate because: unsafe d/c plan    Consultants:  Cardiology  Procedures: none    Subjective: Opens eyes, doesn't respond to my questions  Objective: Vitals:   11/18/22 1345 11/18/22 2000 12-08-22 0215 12/08/22 0823  BP: (!) 77/53  (!) 72/45   Pulse: 91 85 87 64  Resp: 17 14 11 13   Temp:  97.6 F (36.4 C)    TempSrc:  Axillary    SpO2: 96% 97% (!) 86% (!) 89%  Weight:      Height:        Intake/Output Summary (Last 24 hours) at 12/08/2022 0936 Last data filed at 11/18/2022 1300 Gross per 24 hour  Intake 307.51 ml  Output 225 ml  Net 82.51 ml   Filed Weights   11/15/22 0327 11/16/22 0323 11/17/22 0500  Weight: 81.4 kg 81.6 kg 81.6 kg    Examination:  General: chronically ill appearing woman lying in bed in NAD   HEENT: [place]/AT, eyes anicteric.  Neuro: awake, doesn't respond to my questions CV: S1S2, RRR PULM: Wheezing on the R, CTA on the left.  GI: soft, obese Extremities: mild edema, no cyanosis Skin: warm, dry    Data Reviewed: I have personally reviewed following labs and imaging studies  CBC: Recent Labs  Lab 11/14/22 0359 11/14/22 1137 11/15/22 0320 11/16/22 **Note De-Identified [place] Obfuscation** 8101 11/17/22 0450 11/18/22 0350  WBC 11.4*  --  7.1 9.2 11.5* 13.5*  HGB 7.8* 9.2* 8.0* 8.4* 8.4* 8.0*  HCT 26.1* 27.0* 26.5* 28.5* 28.4* 26.5*  MCV 107.4*  --  107.3* 108.4* 109.7* 106.9*  PLT 71*  --  58* 67* 87* 751*   Basic Metabolic Panel: Recent Labs  Lab 11/13/22 0418 11/14/22 0359 11/14/22 1137 11/15/22 0320 11/16/22 0322 11/17/22 0450 11/18/22 0350   NA 141 143 147* 146* 150* 140 136  K 4.2 4.4 4.5 4.0 4.3 4.2 4.7  CL 108 104  --  107 110 103 98  CO2 22 27  --  29 30 28 27   GLUCOSE 183* 153*  --  196* 136* 180* 96  BUN 68* 65*  --  63* 70* 77* 89*  CREATININE 2.54* 1.80*  --  1.32* 1.29* 1.65* 2.27*  CALCIUM 8.0* 8.4*  --  8.3* 8.8* 8.4* 8.6*  MG 1.5* 1.8  --  1.7  --   --  1.9  PHOS 3.4 2.8  --  3.3  --   --   --    GFR: Estimated Creatinine Clearance: 23.5 mL/min (A) (by C-G formula based on SCr of 2.27 mg/dL (H)). Liver Function Tests: Recent Labs  Lab 11/13/22 0418 11/14/22 0359 11/15/22 0320 11/17/22 0450  AST 21 26 34 31  ALT 13 16 20 23   ALKPHOS 103 103 98 95  BILITOT 0.2* 0.3 0.5 0.1*  PROT 6.1* 6.4* 6.5 6.4*  ALBUMIN 2.3* 2.3* 2.2* 2.2*   No results for input(s): "LIPASE", "AMYLASE" in the last 168 hours. No results for input(s): "AMMONIA" in the last 168 hours. Coagulation Profile: No results for input(s): "INR", "PROTIME" in the last 168 hours. Cardiac Enzymes: No results for input(s): "CKTOTAL", "CKMB", "CKMBINDEX", "TROPONINI" in the last 168 hours. BNP (last 3 results) No results for input(s): "PROBNP" in the last 8760 hours. HbA1C: No results for input(s): "HGBA1C" in the last 72 hours. CBG: Recent Labs  Lab 11/17/22 2333 11/18/22 0349 11/18/22 0424 11/18/22 0732 11/18/22 1119  GLUCAP 196* 75 100* 156* 143*   Lipid Profile: No results for input(s): "CHOL", "HDL", "LDLCALC", "TRIG", "CHOLHDL", "LDLDIRECT" in the last 72 hours. Thyroid Function Tests: No results for input(s): "TSH", "T4TOTAL", "FREET4", "T3FREE", "THYROIDAB" in the last 72 hours. Anemia Panel: No results for input(s): "VITAMINB12", "FOLATE", "FERRITIN", "TIBC", "IRON", "RETICCTPCT" in the last 72 hours. Urine analysis:    Component Value Date/Time   COLORURINE AMBER (A) 11/11/2022 0117   APPEARANCEUR CLOUDY (A) 11/11/2022 0117   APPEARANCEUR Clear 06/19/2018 0901   LABSPEC 1.014 11/11/2022 0117   PHURINE 5.0 11/11/2022  0117   GLUCOSEU NEGATIVE 11/11/2022 0117   GLUCOSEU NEGATIVE 12/06/2015 1132   HGBUR LARGE (A) 11/11/2022 0117   BILIRUBINUR NEGATIVE 11/11/2022 0117   BILIRUBINUR Negative 06/19/2018 0901   KETONESUR NEGATIVE 11/11/2022 0117   PROTEINUR >=300 (A) 11/11/2022 0117   UROBILINOGEN 0.2 12/06/2015 1132   NITRITE NEGATIVE 11/11/2022 0117   LEUKOCYTESUR LARGE (A) 11/11/2022 0117   Sepsis Labs: @LABRCNTIP (procalcitonin:4,lacticidven:4)  ) Recent Results (from the past 240 hour(s))  Culture, blood (Routine x 2)     Status: Abnormal   Collection Time: 11/03/2022  9:31 PM   Specimen: BLOOD  Result Value Ref Range Status   Specimen Description   Final    BLOOD SITE NOT SPECIFIED Performed at [facility], [place] [address] [place], [place] [address]    Special Requests   Final    BOTTLES DRAWN **Note De-Identified [place] Obfuscation** AEROBIC AND ANAEROBIC Blood Culture results may not be optimal due to an inadequate volume of blood received in culture bottles Performed at [facility], [place] [address], [place], [place] [address]    Culture  Setup Time   Final    ANAEROBIC BOTTLE ONLY GRAM POSITIVE COCCI IN CLUSTERS CRITICAL RESULT CALLED TO, READ BACK BY AND VERIFIED WITH:  C/ PHARMD C. PIERCE 11/10/22 1956 A. LAFRANCE    Culture (A)  Final    STAPHYLOCOCCUS HAEMOLYTICUS STAPHYLOCOCCUS HOMINIS STAPHYLOCOCCUS EPIDERMIDIS THE SIGNIFICANCE OF ISOLATING THIS ORGANISM FROM A SINGLE SET OF BLOOD CULTURES WHEN MULTIPLE SETS ARE DRAWN IS UNCERTAIN. PLEASE NOTIFY THE MICROBIOLOGY DEPARTMENT WITHIN ONE WEEK IF SPECIATION AND SENSITIVITIES ARE REQUIRED. Performed at [facility], [place] [address], [place], [place] [address]    Report Status 11/12/2022 FINAL  Final  Blood Culture ID Panel (Reflexed)     Status: Abnormal   Collection Time: 11/13/2022  9:31 PM  Result Value Ref Range Status   Enterococcus faecalis NOT DETECTED NOT DETECTED Final   Enterococcus Faecium NOT DETECTED NOT DETECTED Final    Listeria monocytogenes NOT DETECTED NOT DETECTED Final   Staphylococcus species DETECTED (A) NOT DETECTED Final    Comment: CRITICAL RESULT CALLED TO, READ BACK BY AND VERIFIED WITH:  C/ PHARMD C. PIERCE 11/10/22 1956 A. LAFRANCE    Staphylococcus aureus (BCID) NOT DETECTED NOT DETECTED Final   Staphylococcus epidermidis DETECTED (A) NOT DETECTED Final    Comment: Methicillin (oxacillin) resistant coagulase negative staphylococcus. Possible blood culture contaminant (unless isolated from more than one blood culture draw or clinical case suggests pathogenicity). No antibiotic treatment is indicated for blood  culture contaminants. CRITICAL RESULT CALLED TO, READ BACK BY AND VERIFIED WITH:  C/ PHARMD C. PIERCE 11/10/22 1956 A. LAFRANCE    Staphylococcus lugdunensis NOT DETECTED NOT DETECTED Final   Streptococcus species NOT DETECTED NOT DETECTED Final   Streptococcus agalactiae NOT DETECTED NOT DETECTED Final   Streptococcus pneumoniae NOT DETECTED NOT DETECTED Final   Streptococcus pyogenes NOT DETECTED NOT DETECTED Final   A.calcoaceticus-baumannii NOT DETECTED NOT DETECTED Final   Bacteroides fragilis NOT DETECTED NOT DETECTED Final   Enterobacterales NOT DETECTED NOT DETECTED Final   Enterobacter cloacae complex NOT DETECTED NOT DETECTED Final   Escherichia coli NOT DETECTED NOT DETECTED Final   Klebsiella aerogenes NOT DETECTED NOT DETECTED Final   Klebsiella oxytoca NOT DETECTED NOT DETECTED Final   Klebsiella pneumoniae NOT DETECTED NOT DETECTED Final   Proteus species NOT DETECTED NOT DETECTED Final   Salmonella species NOT DETECTED NOT DETECTED Final   Serratia marcescens NOT DETECTED NOT DETECTED Final   Haemophilus influenzae NOT DETECTED NOT DETECTED Final   Neisseria meningitidis NOT DETECTED NOT DETECTED Final   Pseudomonas aeruginosa NOT DETECTED NOT DETECTED Final   Stenotrophomonas maltophilia NOT DETECTED NOT DETECTED Final   Candida albicans NOT DETECTED NOT  DETECTED Final   Candida auris NOT DETECTED NOT DETECTED Final   Candida glabrata NOT DETECTED NOT DETECTED Final   Candida krusei NOT DETECTED NOT DETECTED Final   Candida parapsilosis NOT DETECTED NOT DETECTED Final   Candida tropicalis NOT DETECTED NOT DETECTED Final   Cryptococcus neoformans/gattii NOT DETECTED NOT DETECTED Final   Methicillin resistance mecA/C DETECTED (A) NOT DETECTED Final    Comment: CRITICAL RESULT CALLED TO, READ BACK BY AND VERIFIED WITH:  C/ PHARMD C. PIERCE 11/10/22 1956 A. [place]FRANCE Performed at [facility], [place] [address] [place], [place] [address] **Note De-Identified [place] Obfuscation** Culture, blood (Routine x 2)     Status: None   Collection Time: 11/08/2022  9:40 PM   Specimen: BLOOD  Result Value Ref Range Status   Specimen Description   Final    BLOOD RIGHT ANTECUBITAL Performed at [place] [address], [place], [place] [address]    Special Requests   Final    BOTTLES DRAWN AEROBIC AND ANAEROBIC Blood Culture adequate volume Performed at [place] [address], [place], [place] [address]    Culture   Final    NO GROWTH 5 DAYS Performed at [facility], [place] [address], [place], [place] [address]    Report Status 11/15/2022 FINAL  Final  Resp panel by RT-PCR (RSV, Flu A&B, Covid) Anterior Nasal Swab     Status: None   Collection Time: 11/22/2022 10:33 PM   Specimen: Anterior Nasal Swab  Result Value Ref Range Status   SARS Coronavirus 2 by RT PCR NEGATIVE NEGATIVE Final    Comment: (NOTE) SARS-CoV-2 target nucleic acids are NOT DETECTED.  The SARS-CoV-2 RNA is generally detectable in upper respiratory specimens during the acute phase of infection. The lowest concentration of SARS-CoV-2 viral copies this assay can detect is 138 copies/mL. A negative result does not preclude SARS-Cov-2 infection and should not be used as the sole basis for treatment or other patient management decisions. A negative result may occur  with  improper specimen collection/handling, submission of specimen other than nasopharyngeal swab, presence of viral mutation(s) within the areas targeted by this assay, and inadequate number of viral copies(<138 copies/mL). A negative result must be combined with clinical observations, patient history, and epidemiological information. The expected result is Negative.  Fact Sheet for Patients:  EntrepreneurPulse.com.au  Fact Sheet for Healthcare Providers:  IncredibleEmployment.be  This test is no t yet approved or cleared by the [place] FDA and  has been authorized for detection and/or diagnosis of SARS-CoV-2 by FDA under an Emergency Use Authorization (EUA). This EUA will remain  in effect (meaning this test can be used) for the duration of the COVID-19 declaration under Section 564(b)(1) of the Act, 21 U.S.C.section 360bbb-3(b)(1), unless the authorization is terminated  or revoked sooner.       Influenza A by PCR NEGATIVE NEGATIVE Final   Influenza B by PCR NEGATIVE NEGATIVE Final    Comment: (NOTE) The Xpert Xpress SARS-CoV-2/FLU/RSV plus assay is intended as an aid in the diagnosis of influenza from Nasopharyngeal swab specimens and should not be used as a sole basis for treatment. Nasal washings and aspirates are unacceptable for Xpert Xpress SARS-CoV-2/FLU/RSV testing.  Fact Sheet for Patients: EntrepreneurPulse.com.au  Fact Sheet for Healthcare Providers: IncredibleEmployment.be  This test is not yet approved or cleared by the [place] FDA and has been authorized for detection and/or diagnosis of SARS-CoV-2 by FDA under an Emergency Use Authorization (EUA). This EUA will remain in effect (meaning this test can be used) for the duration of the COVID-19 declaration under Section 564(b)(1) of the Act, 21 U.S.C. section 360bbb-3(b)(1), unless the authorization is terminated  or revoked.     Resp Syncytial Virus by PCR NEGATIVE NEGATIVE Final    Comment: (NOTE) Fact Sheet for Patients: EntrepreneurPulse.com.au  Fact Sheet for Healthcare Providers: IncredibleEmployment.be  This test is not yet approved or cleared by the [place] FDA and has been authorized for detection and/or diagnosis of SARS-CoV-2 by FDA under an Emergency Use Authorization (EUA). This EUA will remain in effect (meaning **Note De-Identified [place] Obfuscation** this test can be used) for the duration of the COVID-19 declaration under Section 564(b)(1) of the Act, 21 U.S.C. section 360bbb-3(b)(1), unless the authorization is terminated or revoked.  Performed at [facility], [place] [address], [place], [place] [address]   MRSA Next Gen by PCR, Nasal     Status: None   Collection Time: 11/10/22  4:30 AM  Result Value Ref Range Status   MRSA by PCR Next Gen NOT DETECTED NOT DETECTED Final    Comment: (NOTE) The GeneXpert MRSA Assay (FDA approved for NASAL specimens only), is one component of a comprehensive MRSA colonization surveillance program. It is not intended to diagnose MRSA infection nor to guide or monitor treatment for MRSA infections. Test performance is not FDA approved in patients less than 45 years old. Performed at [facility], [place] [address] [place], [place] [address]   Culture, blood (Routine X 2) w Reflex to ID Panel     Status: None   Collection Time: 11/10/22  9:05 PM   Specimen: BLOOD LEFT ARM  Result Value Ref Range Status   Specimen Description BLOOD LEFT ARM  Final   Special Requests   Final    BOTTLES DRAWN AEROBIC AND ANAEROBIC Blood Culture results may not be optimal due to an excessive volume of blood received in culture bottles   Culture   Final    NO GROWTH 5 DAYS Performed at [facility], [place] [address] [place], [place] [address]    Report Status 11/15/2022 FINAL  Final  Culture, blood (Routine X 2) w Reflex to ID  Panel     Status: None   Collection Time: 11/10/22  9:05 PM   Specimen: BLOOD LEFT ARM  Result Value Ref Range Status   Specimen Description BLOOD LEFT ARM  Final   Special Requests   Final    BOTTLES DRAWN AEROBIC AND ANAEROBIC Blood Culture adequate volume   Culture   Final    NO GROWTH 5 DAYS Performed at [facility], [place] [address] [place], [place] [address]    Report Status 11/15/2022 FINAL  Final  Culture, Respiratory w Gram Stain     Status: None   Collection Time: 11/14/22 10:33 AM   Specimen: Tracheal Aspirate; Respiratory  Result Value Ref Range Status   Specimen Description TRACHEAL ASPIRATE  Final   Special Requests NONE  Final   Gram Stain   Final    ABUNDANT WBC PRESENT, PREDOMINANTLY PMN MODERATE GRAM NEGATIVE RODS    Culture   Final    ABUNDANT SERRATIA MARCESCENS ABUNDANT STENOTROPHOMONAS MALTOPHILIA NO STAPHYLOCOCCUS AUREUS ISOLATED Performed at [facility], [place] [address], [place],  [address]    Report Status 11/17/2022 FINAL  Final   Organism ID, Bacteria SERRATIA MARCESCENS  Final   Organism ID, Bacteria STENOTROPHOMONAS MALTOPHILIA  Final      Susceptibility   Stenotrophomonas maltophilia - MIC*    LEVOFLOXACIN 0.5 SENSITIVE Sensitive     TRIMETH/SULFA <=20 SENSITIVE Sensitive     * ABUNDANT STENOTROPHOMONAS MALTOPHILIA   Serratia marcescens - MIC*    CEFEPIME <=0.12 SENSITIVE Sensitive     CEFTAZIDIME <=1 SENSITIVE Sensitive     CEFTRIAXONE <=0.25 SENSITIVE Sensitive     CIPROFLOXACIN <=0.25 SENSITIVE Sensitive     GENTAMICIN <=1 SENSITIVE Sensitive     TRIMETH/SULFA <=20 SENSITIVE Sensitive     * ABUNDANT SERRATIA MARCESCENS  MRSA Next Gen by PCR, Nasal     Status: **Note De-Identified [place] Obfuscation** None   Collection Time: 11/15/22  7:14 AM   Specimen: Nasal Mucosa; Nasal Swab  Result Value Ref Range Status   MRSA by PCR Next Gen NOT DETECTED NOT DETECTED Final    Comment: (NOTE) The GeneXpert MRSA Assay (FDA approved for NASAL specimens only), is one  component of a comprehensive MRSA colonization surveillance program. It is not intended to diagnose MRSA infection nor to guide or monitor treatment for MRSA infections. Test performance is not FDA approved in patients less than 40 years old. Performed at [facility], [place] [address], [place], [place] [address]          Radiology Studies: No results found.      Scheduled Meds:  budesonide (PULMICORT) nebulizer solution  0.25 mg Nebulization BID    HYDROmorphone (DILAUDID) injection  0.5 mg Intravenous Q6H   Continuous Infusions:   LOS: 9 days     Desma Maxim, MD [facility]   If 7PM-7AM, please contact night-coverage [email or web address] Password [facility] 11/30/2022, 9:36 AM

## 2022-11-30 NOTE — Progress Notes (Signed)
**Note De-Identified [place] Obfuscation** Daily Progress Note   Patient Name: Hailey Hernandez       Date: 12-10-22 DOB: 10-09-1955  Age: 67 y.o. MRN#: [id number] Attending Physician: Gwynne Edinger, MD Primary Care Physician: [name], DO Admit Date: 11/08/2022 Length of Stay: 9 days  Reason for Consultation/Follow-up: Establishing goals of care and Terminal Care  HPI/Patient Profile:  67 y.o. female  with past medical history of  cirrhosis, COPD on 4L home oxygen, non-small cell lung cancer s/p chemotherapy in 2022, OSA on cpap, CAD, HTN, and HLD who presented to the ED on 11/02/2022 with altered mental status. She was found to have elevated troponin with concern for STEMI. Intubated in the ED. Admitted to PCCM with acute on chronic respiratory failure.    Palliative Medicine was consulted for goals of care.   Subjective:   Subjective: Chart Reviewed. Updates received. Patient Assessed. Created space and opportunity for patient  and family to explore thoughts and feelings regarding current medical situation.  Today's Discussion: Today I saw the patient at bedside and spoke with the bedside nurse.  No family was present.  Nursing states that she just received a dose of Dilaudid.  She has appeared comfortable with IV pushes.  Discussed if nursing feels that there is any need for transition to a pain drip and they do not feel it is necessary at this time.  I offered that they can reach out to [place] at any point if the drip is needed if IV pushes are no longer controlling her symptoms and keeping her comfortable.  They verbalized understanding.  I went and saw the patient.  She is not responding to me.  Her respirations are even and unlabored.  Review of Systems  Unable to perform ROS: Acuity of condition    Objective:   Vital Signs:  BP (!) 72/45   Pulse 64   Temp 97.6 F (36.4 C) (Axillary)   Resp 13   Ht 5\' 2"  (1.575 m)   Wt 81.6 kg   SpO2 (!) 89%   BMI 32.90 kg/m   Physical Exam: Physical Exam Vitals and  nursing note reviewed.  Constitutional:      General: She is not in acute distress.    Appearance: She is ill-appearing.  HENT:     Head: Normocephalic and atraumatic.  Cardiovascular:     Rate and Rhythm: Normal rate.  Pulmonary:     Effort: No respiratory distress.     Comments: O2 saturations 87% Skin:    General: Skin is warm and dry.     Palliative Assessment/Data: 10%    Existing Vynca/ACP Documentation: None  Assessment & Plan:   Impression: Present on Admission:  Hypotension, unspecified hypotension type  SUMMARY OF RECOMMENDATIONS   Continue comfort care Consider transition from IV push to Dilaudid drip if needed Continue patient and family support Pending transfer to 6N PMT will continue to follow for symptom management  Symptom Management:  Tylenol 650 mg PR every 6 hours as needed fever Robinul 0.2 mg IV every 4 hours as needed excessive secretions Haldol 2.5 to 5 mg IV every 4 hours as needed agitation Dilaudid 0.5 to 1 mg IV every hour as needed dyspnea or pain Versed 2 to 4 mg IV every 4 hours as needed agitation, anxiety, seizures Zofran 4 mg IV every 6 hours as needed nausea Oxycodone 5 to 10 mg per tube every 4 hours as needed moderate pain Polyvinyl alcohol 1.4% ophthalmic 1 drop OU 4 times daily as **Note De-Identified [place] Obfuscation** needed dry eyes  Code Status: DNR  Prognosis: Hours - Days  Discharge Planning: Anticipated Hospital Death  Discussed with: medical team, nursing team  Thank you for allowing [place] to participate in the care of Shai C Paskett PMT will continue to support holistically.  Billing based on MDM: High  Problems Addressed: One acute or chronic illness or injury that poses a threat to life or bodily function  Amount and/or Complexity of Data: Category 3:Discussion of management or test interpretation with external physician/other qualified health care professional/appropriate source (not separately reported)  Risks: Parenteral controlled  substances  Walden Field, NP Palliative Medicine Team  Team Phone # [phone] (Nights/Weekends)  05/30/2021, 8:17 AM

## 2022-11-30 NOTE — Progress Notes (Signed)
**Note De-Identified [place] Obfuscation** Nutrition Brief Note  Chart reviewed. Pt now transitioning to comfort care.  No further nutrition interventions planned at this time.  Please re-consult as needed.   [name] MS, RDN, LDN, CNSC Registered Dietitian 3 Clinical Nutrition RD Pager and On-Call Pager Number Located in [place]

## 2022-11-30 DEATH — deceased

## 2022-12-04 ENCOUNTER — Other Ambulatory Visit: Payer: Medicare Other

## 2022-12-06 ENCOUNTER — Ambulatory Visit: Payer: Medicare Other | Admitting: Physician Assistant

## 2022-12-06 ENCOUNTER — Ambulatory Visit: Payer: Medicare Other | Admitting: Internal Medicine

## 2022-12-07 ENCOUNTER — Ambulatory Visit: Payer: Medicare Other | Admitting: Family Medicine

## 2022-12-13 ENCOUNTER — Ambulatory Visit: Payer: Medicare Other | Admitting: Internal Medicine

## 2022-12-21 ENCOUNTER — Ambulatory Visit: Payer: Medicare Other | Admitting: Internal Medicine
